# Patient Record
Sex: Male | Born: 1940
Health system: Southern US, Community
[De-identification: ages and names within clinical notes are randomized; demographics above are authoritative.]

## PROBLEM LIST (undated history)

## (undated) DIAGNOSIS — Z8659 Personal history of other mental and behavioral disorders: Secondary | ICD-10-CM

## (undated) DIAGNOSIS — E785 Hyperlipidemia, unspecified: Secondary | ICD-10-CM

## (undated) DIAGNOSIS — E039 Hypothyroidism, unspecified: Secondary | ICD-10-CM

## (undated) DIAGNOSIS — Z77098 Contact with and (suspected) exposure to other hazardous, chiefly nonmedicinal, chemicals: Secondary | ICD-10-CM

## (undated) DIAGNOSIS — I2510 Atherosclerotic heart disease of native coronary artery without angina pectoris: Secondary | ICD-10-CM

## (undated) DIAGNOSIS — M1990 Unspecified osteoarthritis, unspecified site: Secondary | ICD-10-CM

## (undated) DIAGNOSIS — R51 Headache: Secondary | ICD-10-CM

## (undated) DIAGNOSIS — C61 Malignant neoplasm of prostate: Secondary | ICD-10-CM

## (undated) DIAGNOSIS — Q249 Congenital malformation of heart, unspecified: Secondary | ICD-10-CM

## (undated) DIAGNOSIS — R519 Headache, unspecified: Secondary | ICD-10-CM

## (undated) DIAGNOSIS — F4310 Post-traumatic stress disorder, unspecified: Secondary | ICD-10-CM

## (undated) DIAGNOSIS — N400 Enlarged prostate without lower urinary tract symptoms: Secondary | ICD-10-CM

## (undated) HISTORY — DX: Benign prostatic hyperplasia without lower urinary tract symptoms: N40.0

## (undated) HISTORY — DX: Personal history of other mental and behavioral disorders: Z86.59

## (undated) HISTORY — DX: Malignant neoplasm of prostate: C61

## (undated) HISTORY — DX: Hyperlipidemia, unspecified: E78.5

## (undated) HISTORY — DX: Unspecified osteoarthritis, unspecified site: M19.90

## (undated) HISTORY — PX: HERNIA REPAIR: SHX51

## (undated) HISTORY — DX: Congenital malformation of heart, unspecified: Q24.9

---

## 2000-06-21 ENCOUNTER — Inpatient Hospital Stay (HOSPITAL_COMMUNITY): Admission: EM | Admit: 2000-06-21 | Discharge: 2000-06-23 | Payer: Self-pay | Admitting: Emergency Medicine

## 2000-06-22 ENCOUNTER — Encounter: Payer: Self-pay | Admitting: *Deleted

## 2011-08-17 ENCOUNTER — Encounter: Payer: Self-pay | Admitting: Cardiology

## 2012-02-24 DIAGNOSIS — R079 Chest pain, unspecified: Secondary | ICD-10-CM | POA: Diagnosis present

## 2012-02-24 DIAGNOSIS — I2510 Atherosclerotic heart disease of native coronary artery without angina pectoris: Secondary | ICD-10-CM | POA: Diagnosis present

## 2012-04-20 ENCOUNTER — Other Ambulatory Visit (HOSPITAL_COMMUNITY): Payer: Self-pay | Admitting: Family Medicine

## 2012-04-20 ENCOUNTER — Ambulatory Visit (HOSPITAL_COMMUNITY)
Admission: RE | Admit: 2012-04-20 | Discharge: 2012-04-20 | Disposition: A | Discharge status: Home / Self Care | Payer: No Typology Code available for payment source | Source: Ambulatory Visit | Attending: Family Medicine | Admitting: Family Medicine

## 2012-04-20 DIAGNOSIS — R109 Unspecified abdominal pain: Secondary | ICD-10-CM | POA: Insufficient documentation

## 2012-04-20 DIAGNOSIS — R102 Pelvic and perineal pain: Secondary | ICD-10-CM

## 2012-04-20 DIAGNOSIS — N419 Inflammatory disease of prostate, unspecified: Secondary | ICD-10-CM | POA: Insufficient documentation

## 2012-04-20 IMAGING — CT CT ABD-PELV W/O CM
2 of 4 series · 16 of 46 positions shown, 18 images · non-contrast
Comparison: None.

CLINICAL DATA: Pelvic pain with prostatitis for 1 month.  Question
urinary tract calculus.

CT ABDOMEN AND PELVIS WITHOUT CONTRAST
TECHNIQUE: Multidetector CT imaging of the abdomen and pelvis was
performed following the standard protocol without intravenous
contrast.

[Series 3: standard/full over (age)lbs 5.0 · axial · 0.74mm/px · z∈[-490,-90]mm · 13 of 88 slices shown, 15 images]
[im 4/88  soft-tissue]
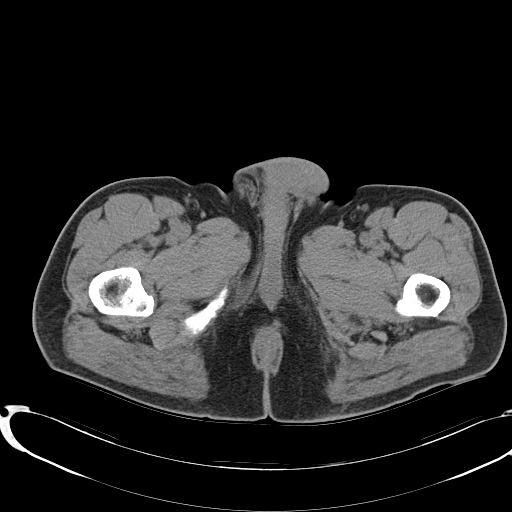
[im 4/88  bone]
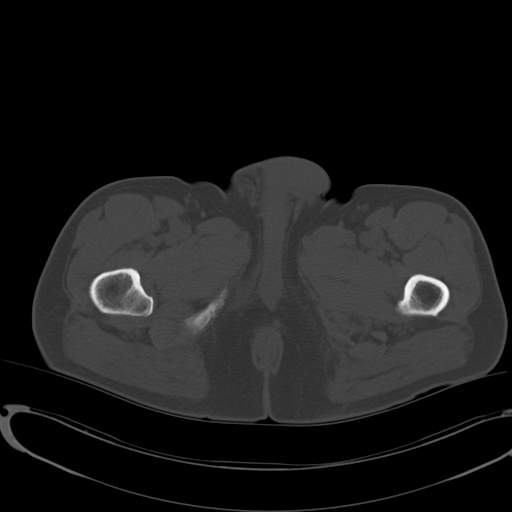
[im 11/88  soft-tissue]
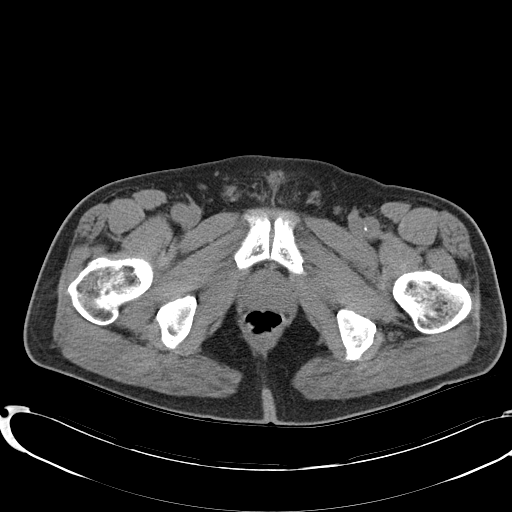
[im 19/88  soft-tissue]
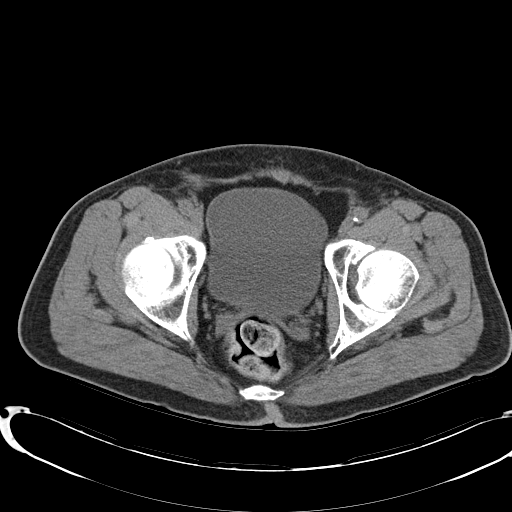
[im 26/88  soft-tissue]
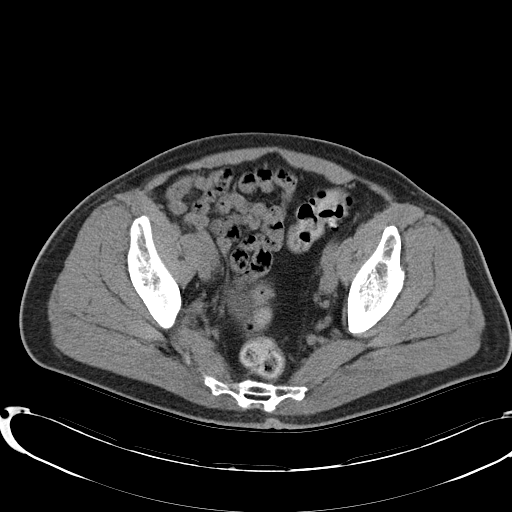
[im 30/88  soft-tissue]
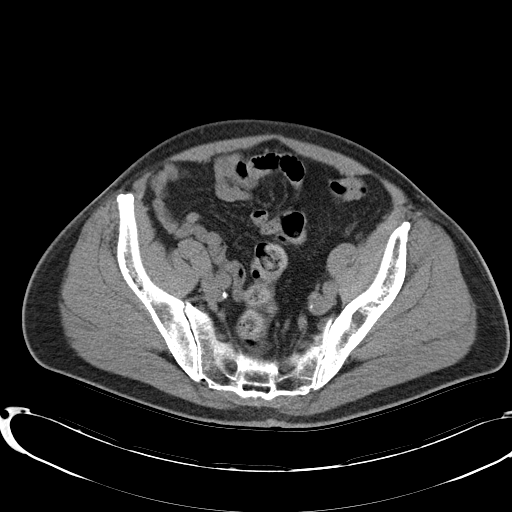
[im 37/88  soft-tissue]
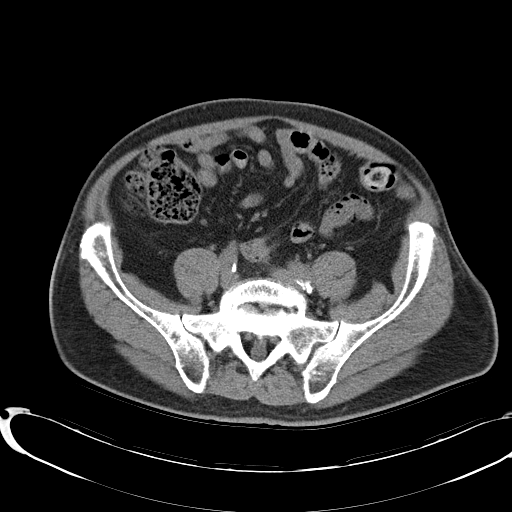
[im 44/88  soft-tissue]
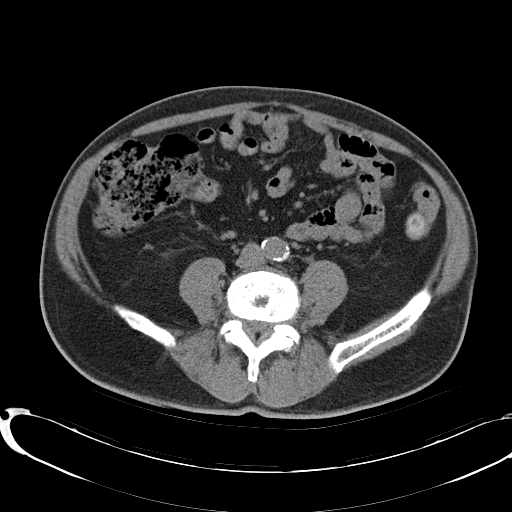
[im 51/88  soft-tissue]
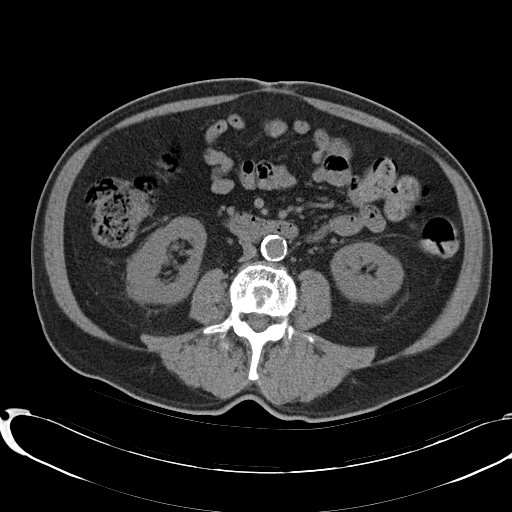
[im 59/88  soft-tissue]
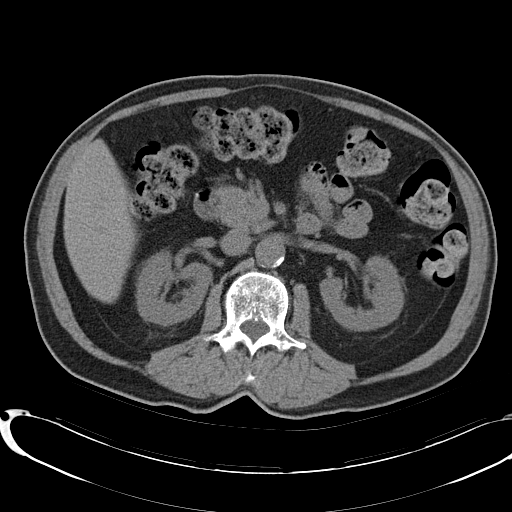
[im 59/88  bone]
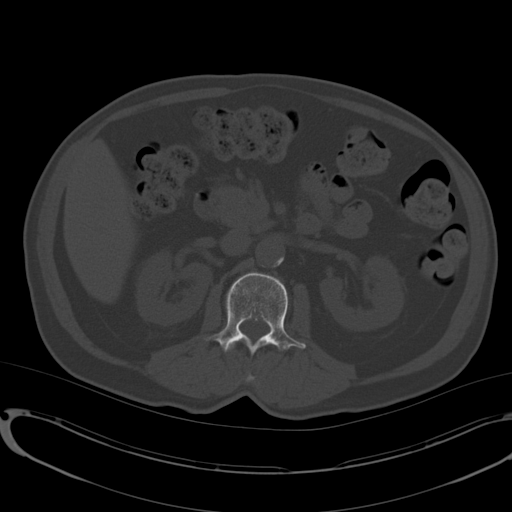
[im 62/88  soft-tissue]
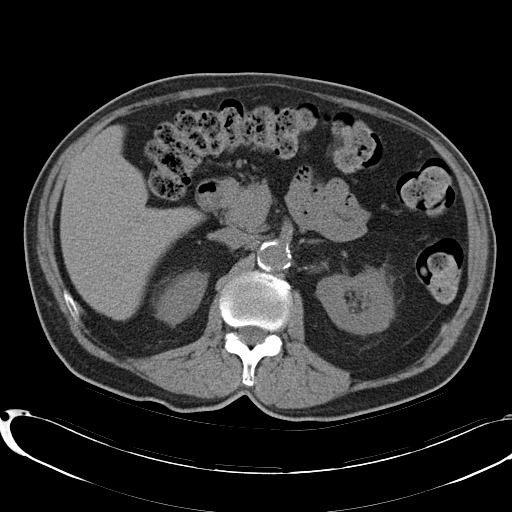
[im 69/88  soft-tissue]
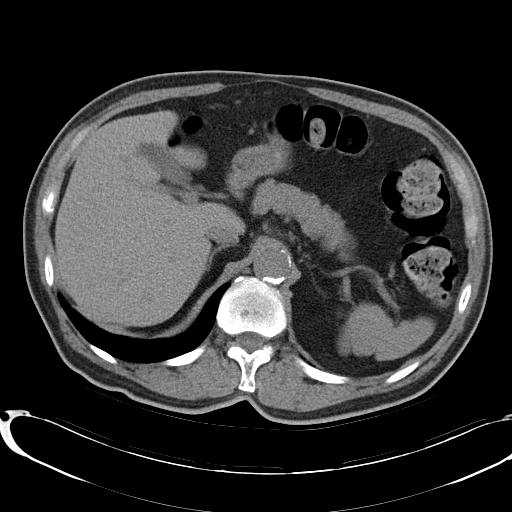
[im 77/88  soft-tissue]
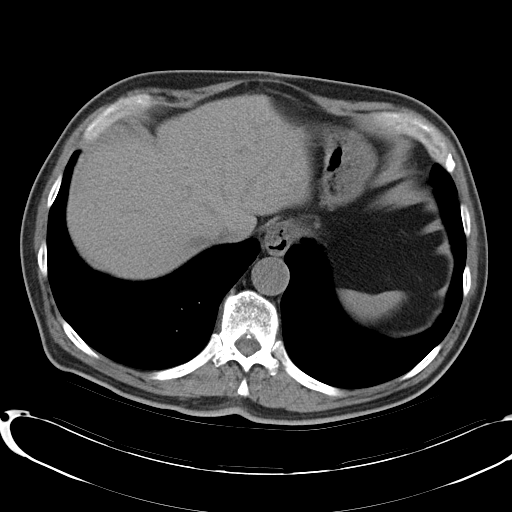
[im 84/88  soft-tissue]
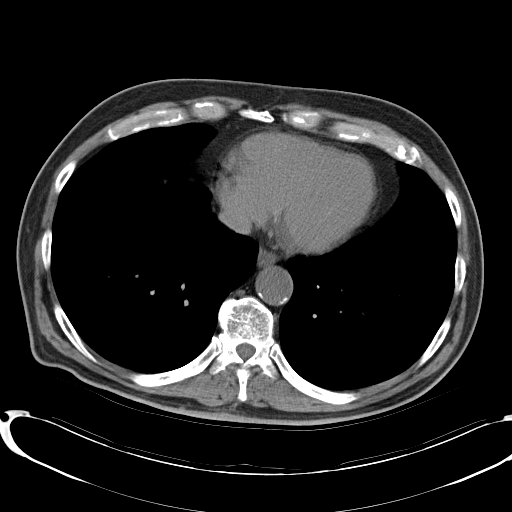

[Series 4: mpr coronal · coronal · 0.69mm/px · 3 of 84 slices shown]
[im 28/84  soft-tissue]
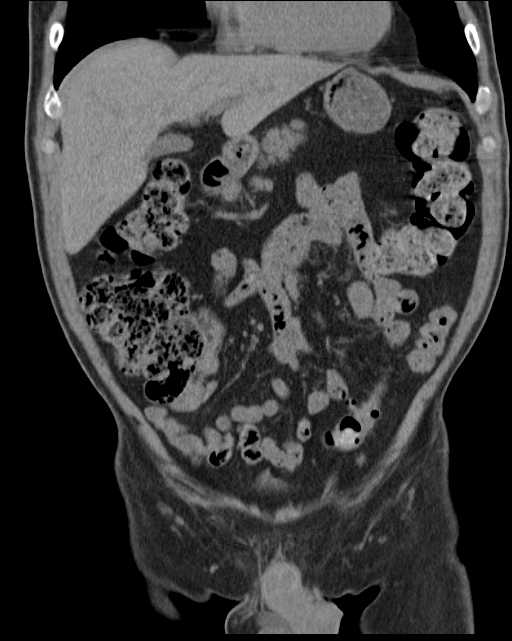
[im 37/84  soft-tissue]
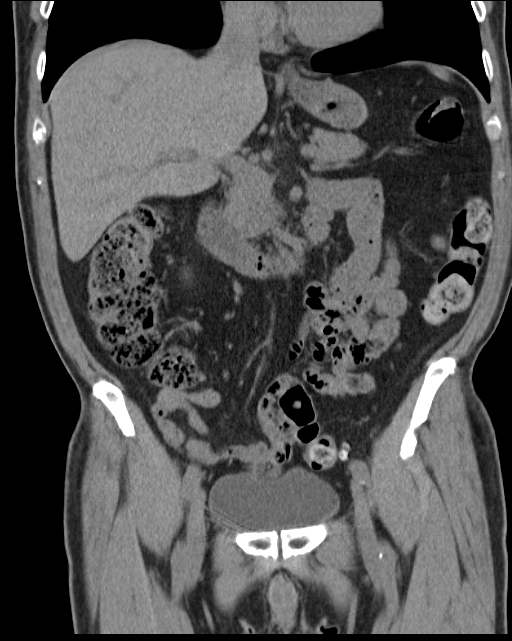
[im 47/84  soft-tissue]
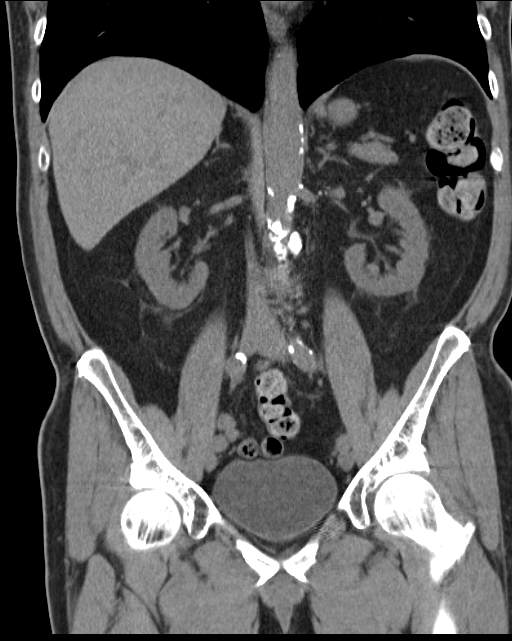

[16 of 46 positions shown; findings below may reference images not displayed]

FINDINGS: There is mild nonspecific symmetric perinephric soft
tissue stranding bilaterally.  There is no evidence of
hydronephrosis.  There is no evidence of renal, ureteral or bladder
calculus.  The kidneys and bladder demonstrate no abnormalities as
imaged in the noncontrast state. The prostate gland, seminal
vesicles and perirectal soft tissues appear normal.

The lung bases are clear.  There is no pleural effusion.  There is
a probable 8 mm cyst in the dome of the left hepatic lobe on image
number 10.  The liver, gallbladder, pancreas, spleen and adrenal
glands otherwise appear unremarkable.

There is mild diffuse aorto iliac atherosclerosis.  No enlarged
abdominal pelvic lymph nodes are identified.  There is stool
throughout the colon without surrounding inflammation.  The small
bowel and appendix appear normal.

Schmorl's nodes and facet degenerative changes are noted throughout
the lower lumbar spine. There is likely a degree of central and
foraminal stenosis at the lower three levels.  No worrisome osseous
findings are identified.
IMPRESSION: 1.  No evidence of urinary tract calculus or hydronephrosis.
2.  No acute abdominal pelvic findings identified.
3.  Lower lumbar spondylosis with probable resulting central and
foraminal stenosis from L3-L4 through L5-S1.

## 2012-05-22 ENCOUNTER — Telehealth: Payer: Self-pay | Admitting: Physician Assistant

## 2012-05-22 ENCOUNTER — Ambulatory Visit (INDEPENDENT_AMBULATORY_CARE_PROVIDER_SITE_OTHER): Payer: Medicare Other | Admitting: Family Medicine

## 2012-05-22 ENCOUNTER — Encounter: Payer: Self-pay | Admitting: Family Medicine

## 2012-05-22 VITALS — BP 142/79 | HR 54 | Temp 97.1°F | Ht 67.0 in | Wt 158.5 lb

## 2012-05-22 DIAGNOSIS — R51 Headache: Secondary | ICD-10-CM

## 2012-05-22 DIAGNOSIS — IMO0001 Reserved for inherently not codable concepts without codable children: Secondary | ICD-10-CM

## 2012-05-22 DIAGNOSIS — N400 Enlarged prostate without lower urinary tract symptoms: Secondary | ICD-10-CM

## 2012-05-22 DIAGNOSIS — J019 Acute sinusitis, unspecified: Secondary | ICD-10-CM

## 2012-05-22 DIAGNOSIS — J310 Chronic rhinitis: Secondary | ICD-10-CM

## 2012-05-22 DIAGNOSIS — E785 Hyperlipidemia, unspecified: Secondary | ICD-10-CM | POA: Insufficient documentation

## 2012-05-22 DIAGNOSIS — R030 Elevated blood-pressure reading, without diagnosis of hypertension: Secondary | ICD-10-CM

## 2012-05-22 MED ORDER — FLUTICASONE PROPIONATE 50 MCG/ACT NA SUSP: 2.0000 | Freq: Every day | NASAL | Status: DC

## 2012-05-22 MED ORDER — AMOXICILLIN-POT CLAVULANATE 875-125 MG PO TABS: 1.0000 | ORAL_TABLET | Freq: Two times a day (BID) | ORAL | Status: DC

## 2012-05-22 MED ORDER — TRAMADOL HCL 50 MG PO TABS: 100.0000 mg | ORAL_TABLET | Freq: Three times a day (TID) | ORAL | Status: DC | PRN

## 2012-05-22 NOTE — Telephone Encounter (Signed)
SINUS PROBLEM- X 3 WKS

## 2012-05-22 NOTE — Telephone Encounter (Signed)
Patient would like an appt today for sinus pain and bad headaches that he has been having for about 3 weeks now.

## 2012-05-22 NOTE — Progress Notes (Signed)
Subjective:     Patient ID: Keith Olson., male   DOB: 1940-04-13, 72 y.o.   MRN: [id number]  HPI  Mr. Vue comes in with a complaint of 3 weeks of having nasal congestion with foul smelling yellow phlegm or mucus. A niece having pain in his cheeks and his forehead. Has had headaches on and off in the past but not this bad before, he is taking Goody powders everyday and it may be making his headaches worse. He is also using Afrin for 3 days and his nose seems to be getting more stuffed up. No neurologic deficits. No visual deficits. No facial swelling. No fever. No blurred vision. Nothing is relieving the symptoms. He thought it was time to get a strong antibiotic.  Past Medical History  Diagnosis Date  . Hyperlipidemia   . Enlarged prostate    History reviewed. No pertinent past surgical history. History   Social History  . Marital Status: Married    Spouse Name: N/A    Number of Children: N/A  . Years of Education: N/A   Occupational History  . retired    Social History Main Topics  . Smoking status: Never Smoker   . Smokeless tobacco: Not on file  . Alcohol Use: No  . Drug Use: No  . Sexually Active: Not on file   Other Topics Concern  . Not on file   Social History Narrative   Married   5 children   History reviewed. No pertinent family history. No current outpatient prescriptions on file prior to visit.   No current facility-administered medications on file prior to visit.   Allergies  Allergen Reactions  . Lipitor (Atorvastatin)     Muscles aches    There is no immunization history on file for this patient. Prior to Admission medications   Medication Sig Start Date End Date Taking? Authorizing Provider  oxymetazoline (AFRIN) 0.05 % nasal spray Place 2 sprays into the nose 2 (two) times daily.   Yes Historical Provider, MD  rosuvastatin (CRESTOR) 10 MG tablet Take 10 mg by mouth daily.   Yes Historical Provider, MD  tamsulosin (FLOMAX) 0.4 MG CAPS Take  0.4 mg by mouth.   Yes Historical Provider, MD  amoxicillin-clavulanate (AUGMENTIN) 875-125 MG per tablet Take 1 tablet by mouth 2 (two) times daily. 05/22/12   Ileana Ladd, MD  fluticasone (FLONASE) 50 MCG/ACT nasal spray Place 2 sprays into the nose daily. 05/22/12   Ileana Ladd, MD  traMADol (ULTRAM) 50 MG tablet Take 2 tablets (100 mg total) by mouth every 8 (eight) hours as needed for pain. 05/22/12   Ileana Ladd, MD    Review of Systems All other symptoms were negative. The medication he has used so far has not affected his BPH. He has never had a history of hypertension.     Objective:   Physical Exam On examination he appeared in no acute distress. Vital signs as documented. BP 142/79  Pulse 54  Temp(Src) 97.1 F (36.2 C) (Oral)  Ht 5\' 7"  (1.702 m)  Wt 158 lb 8 oz (71.895 kg)  BMI 24.82 kg/m2  Skin warm and dry and without overt rashes.  Head &Neck without JVD. Pearly nasal discharge. Maxillary sinus tender frontal sinuses mildly tender. Throat was clear except for postnasal drip. Eyes are normal ears normal neck was supple. Lungs clear.  Heart exam notable for regular rhythm, normal sounds and absence of murmurs, rubs or gallops.  Abdomen unremarkable and without evidence  of organomegaly, masses, or abdominal aortic enlargement.  Extremities nonedematous. Neurologically, the patient was awake, alert, and oriented to person, place and time. There were no obvious focal neurologic abnormalities.    Assessment:     Other and unspecified hyperlipidemia  BPH (benign prostatic hyperplasia)  Purulent rhinitis  Acute sinusitis - Plan: amoxicillin-clavulanate (AUGMENTIN) 875-125 MG per tablet, fluticasone (FLONASE) 50 MCG/ACT nasal spray  Headache - Plan: traMADol (ULTRAM) 50 MG tablet  Elevated blood pressure      Plan:      No results found for this or any previous visit.   Medications prescribed Augmentin 875 mg by mouth twice a day 10 days. Flonase nasal  spray was prescribed as per meds/orders. Tramadol 50 mg tablets to be taken to every 8 hours when necessary headache. If any neurologic deficit call 911 or TED stat. His blood pressure was elevated probably due to Afrin and the Goody powders. Patient immediately discontinue the Afrin and Goody powders. Plan for him to return to clinic in 4 weeks to recheck his blood pressure. Spent 25 minutes discussing the patient treatment for headaches. And in view of his acute medical problems risk of complications from sinusitis results into a neurologic infection discussed with patient. Sanna Porcaro P. [place] Charon, M.D.

## 2012-06-22 ENCOUNTER — Ambulatory Visit: Payer: Medicare Other | Admitting: Family Medicine

## 2012-08-17 ENCOUNTER — Ambulatory Visit (INDEPENDENT_AMBULATORY_CARE_PROVIDER_SITE_OTHER): Payer: Medicare Other

## 2012-08-17 ENCOUNTER — Ambulatory Visit (INDEPENDENT_AMBULATORY_CARE_PROVIDER_SITE_OTHER): Payer: Medicare Other | Admitting: Family Medicine

## 2012-08-17 ENCOUNTER — Encounter: Payer: Self-pay | Admitting: Family Medicine

## 2012-08-17 VITALS — BP 122/72 | HR 56 | Temp 96.8°F | Ht 66.5 in | Wt 158.0 lb

## 2012-08-17 DIAGNOSIS — M25569 Pain in unspecified knee: Secondary | ICD-10-CM

## 2012-08-17 DIAGNOSIS — M79609 Pain in unspecified limb: Secondary | ICD-10-CM

## 2012-08-17 DIAGNOSIS — M549 Dorsalgia, unspecified: Secondary | ICD-10-CM

## 2012-08-17 DIAGNOSIS — M25519 Pain in unspecified shoulder: Secondary | ICD-10-CM

## 2012-08-17 DIAGNOSIS — M2550 Pain in unspecified joint: Secondary | ICD-10-CM

## 2012-08-17 LAB — COMPREHENSIVE METABOLIC PANEL
ALT: 20 U/L (ref 0–53)
AST: 21 U/L (ref 0–37)
Albumin: 4.1 g/dL (ref 3.5–5.2)
Alkaline Phosphatase: 53 U/L (ref 39–117)
BUN: 8 mg/dL (ref 6–23)
CO2: 23 mEq/L (ref 19–32)
Calcium: 9.4 mg/dL (ref 8.4–10.5)
Chloride: 106 mEq/L (ref 96–112)
Creat: 0.79 mg/dL (ref 0.50–1.35)
Glucose, Bld: 77 mg/dL (ref 70–99)
Potassium: 4.5 mEq/L (ref 3.5–5.3)
Sodium: 140 mEq/L (ref 135–145)
Total Bilirubin: 0.4 mg/dL (ref 0.3–1.2)
Total Protein: 7.1 g/dL (ref 6.0–8.3)

## 2012-08-17 LAB — POCT URINALYSIS DIPSTICK
Bilirubin, UA: NEGATIVE
Blood, UA: NEGATIVE
Glucose, UA: NEGATIVE
Ketones, UA: NEGATIVE
Leukocytes, UA: NEGATIVE
Nitrite, UA: NEGATIVE
Protein, UA: NEGATIVE
Spec Grav, UA: <=1.005
Urobilinogen, UA: NEGATIVE
pH, UA: 6.5

## 2012-08-17 LAB — TSH: TSH: 5.101 u[IU]/mL — ABNORMAL HIGH (ref 0.350–4.500)

## 2012-08-17 LAB — CBC WITH DIFFERENTIAL/PLATELET
Basophils Absolute: 0 10*3/uL (ref 0.0–0.1)
Basophils Relative: 1 % (ref 0–1)
Eosinophils Absolute: 0.2 10*3/uL (ref 0.0–0.7)
Eosinophils Relative: 3 % (ref 0–5)
HCT: 41.2 % (ref 39.0–52.0)
Hemoglobin: 13.9 g/dL (ref 13.0–17.0)
Lymphocytes Relative: 42 % (ref 12–46)
Lymphs Abs: 2.6 10*3/uL (ref 0.7–4.0)
MCH: 29.3 pg (ref 26.0–34.0)
MCHC: 33.7 g/dL (ref 30.0–36.0)
MCV: 86.9 fL (ref 78.0–100.0)
Monocytes Absolute: 0.5 10*3/uL (ref 0.1–1.0)
Monocytes Relative: 8 % (ref 3–12)
Neutro Abs: 2.8 10*3/uL (ref 1.7–7.7)
Neutrophils Relative %: 46 % (ref 43–77)
Platelets: 231 10*3/uL (ref 150–400)
RBC: 4.74 MIL/uL (ref 4.22–5.81)
RDW: 14.1 % (ref 11.5–15.5)
WBC: 6.1 10*3/uL (ref 4.0–10.5)

## 2012-08-17 LAB — SEDIMENTATION RATE: Sed Rate: 1 mm/hr (ref 0–16)

## 2012-08-17 LAB — URIC ACID: Uric Acid, Serum: 3.8 mg/dL — ABNORMAL LOW (ref 4.0–6.0)

## 2012-08-17 IMAGING — CR DG KNEE COMPLETE 4+V*L*
4 series · 4 of 4 positions shown · non-contrast
Comparison: None.

CLINICAL DATA: Knee pain, rule out arthritis.

LEFT KNEE - COMPLETE 4+ VIEW

[view not recorded (1 of 4)]
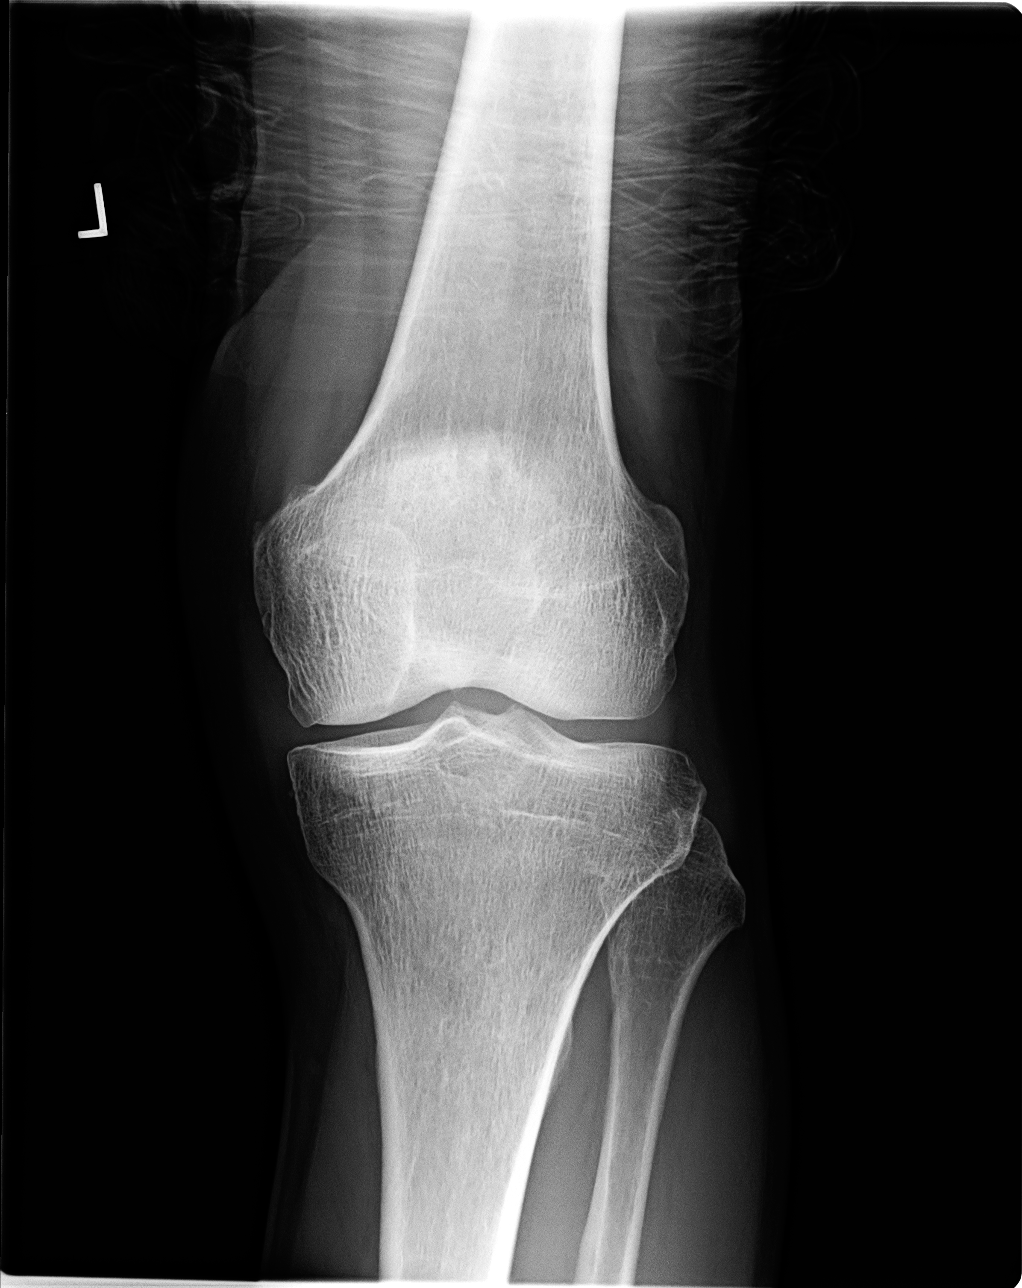

[view not recorded (2 of 4)]
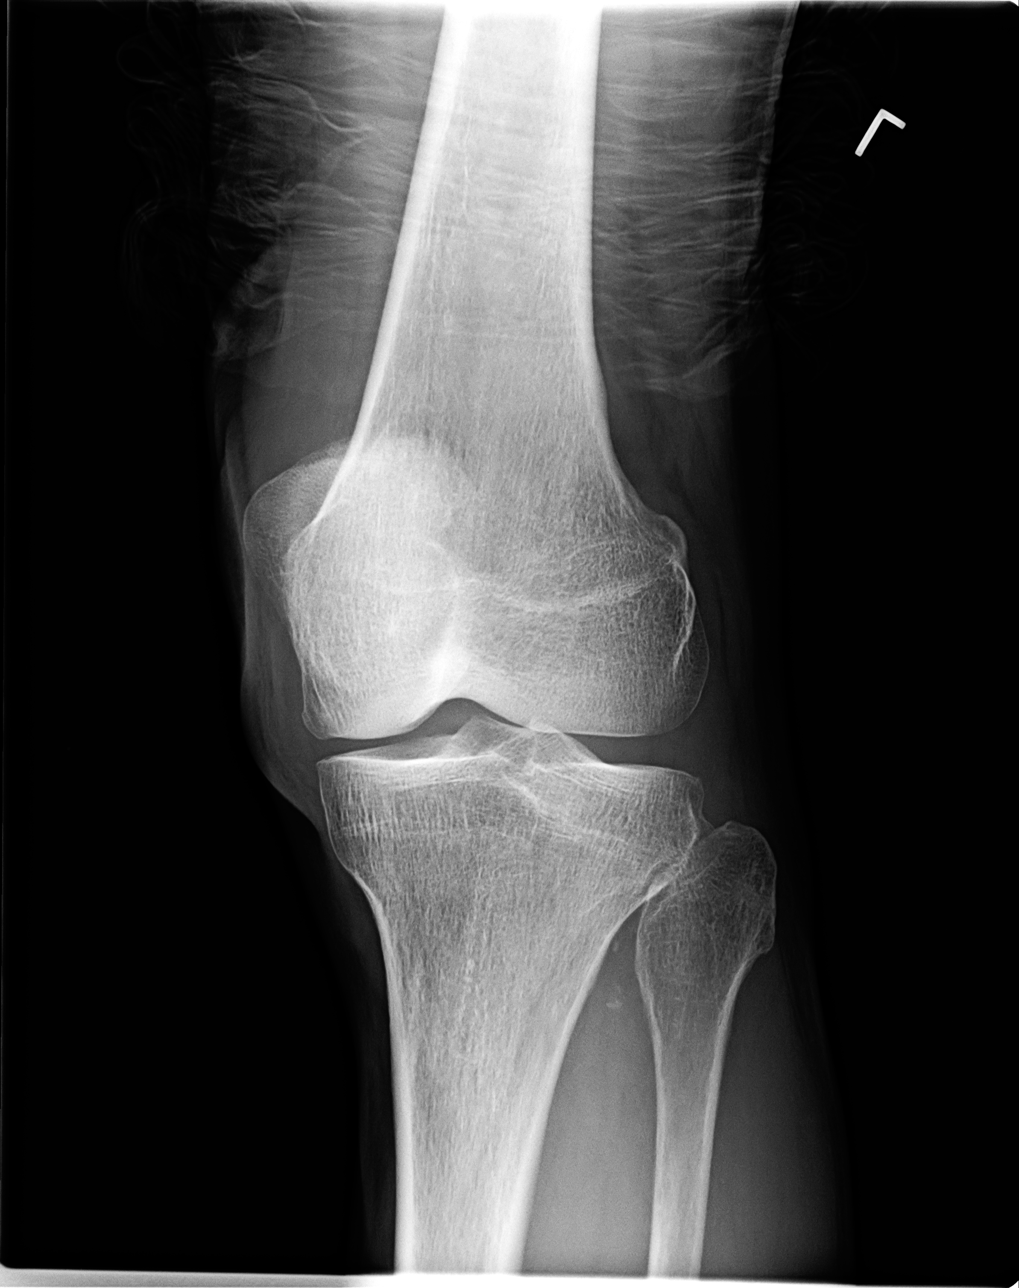

[view not recorded (3 of 4)]
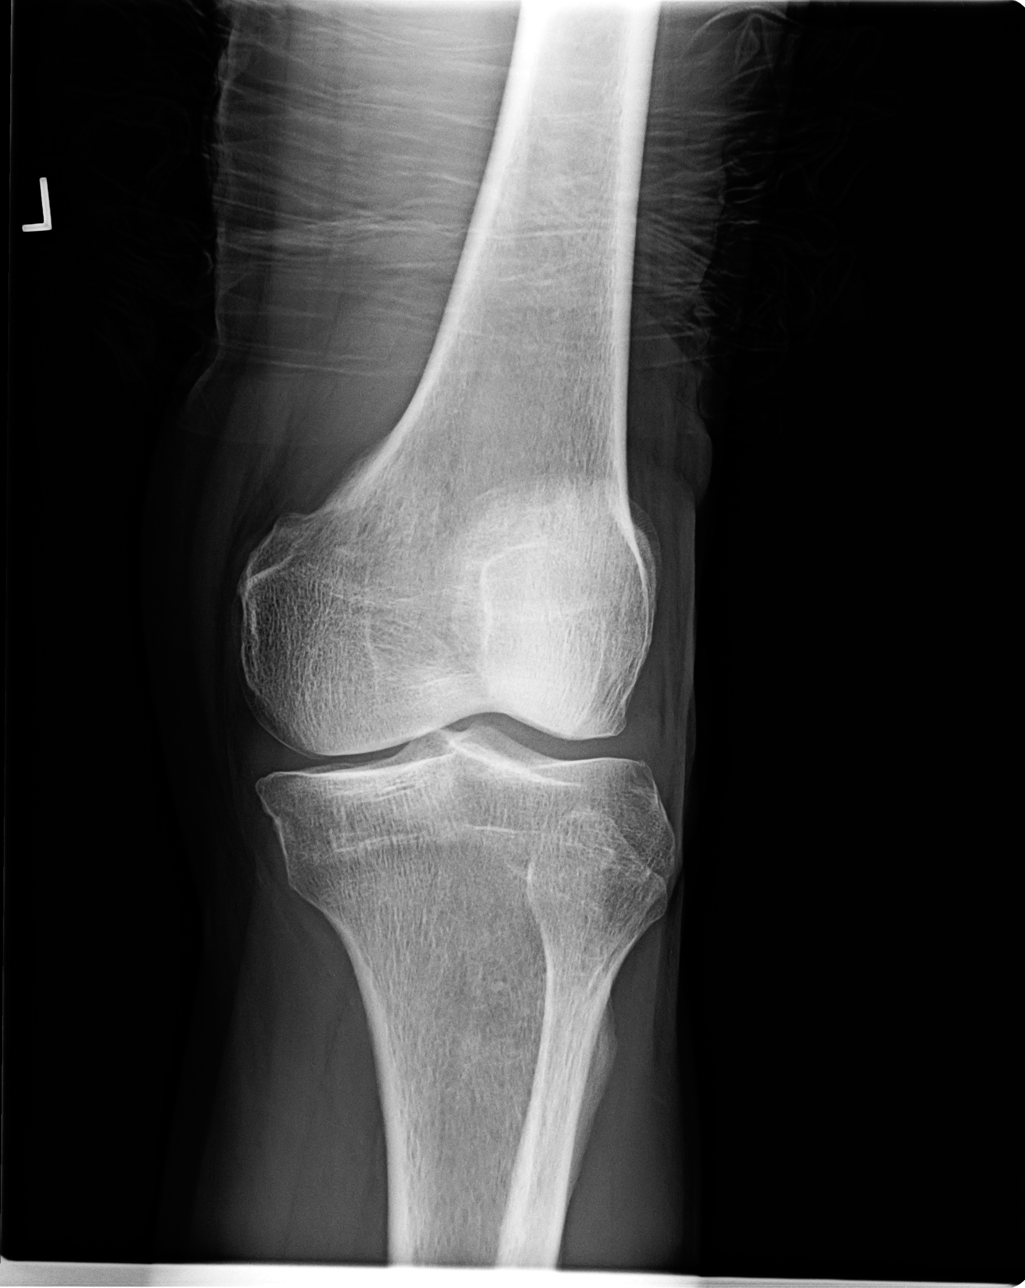

[view not recorded (4 of 4)]
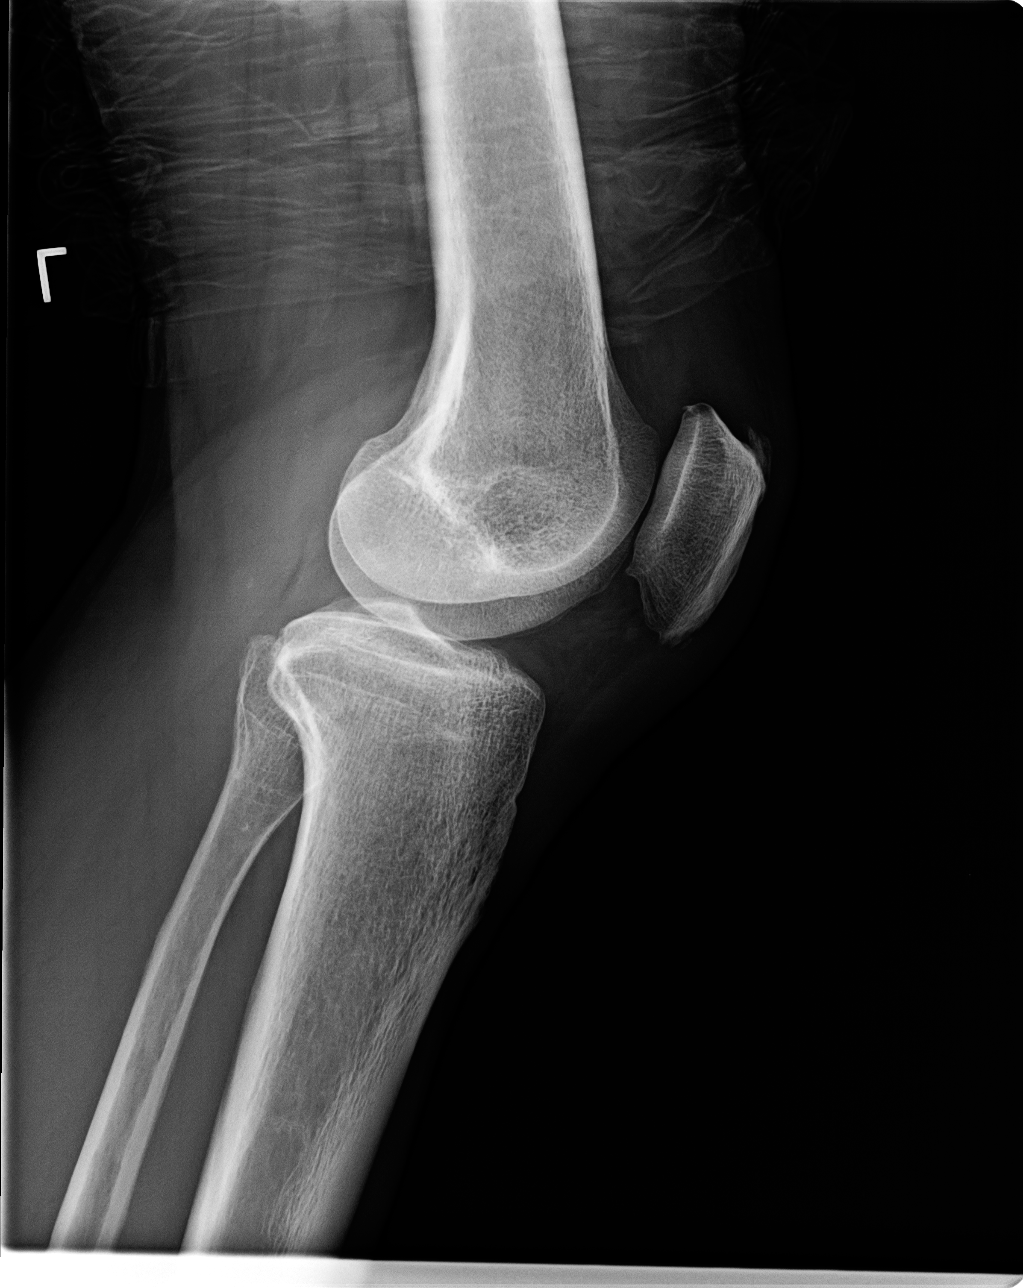

[4 of 4 positions shown; findings below may reference images not displayed]

FINDINGS: There is subtle narrowing of the medial compartment and
very minimal spurring of the superior and inferior pole of the
patella.  There is no fracture or joint effusion.
IMPRESSION: No acute findings.

Subtle degenerate changes.

Clinically significant discrepancy from primary report, if
provided: None

## 2012-08-17 IMAGING — CR DG SHOULDER 2+V*R*
4 series · 4 of 4 positions shown · non-contrast
Comparison: None.

CLINICAL DATA: Shoulder pain, possible arthritis

RIGHT SHOULDER - 2+ VIEW

[view not recorded (1 of 4)]
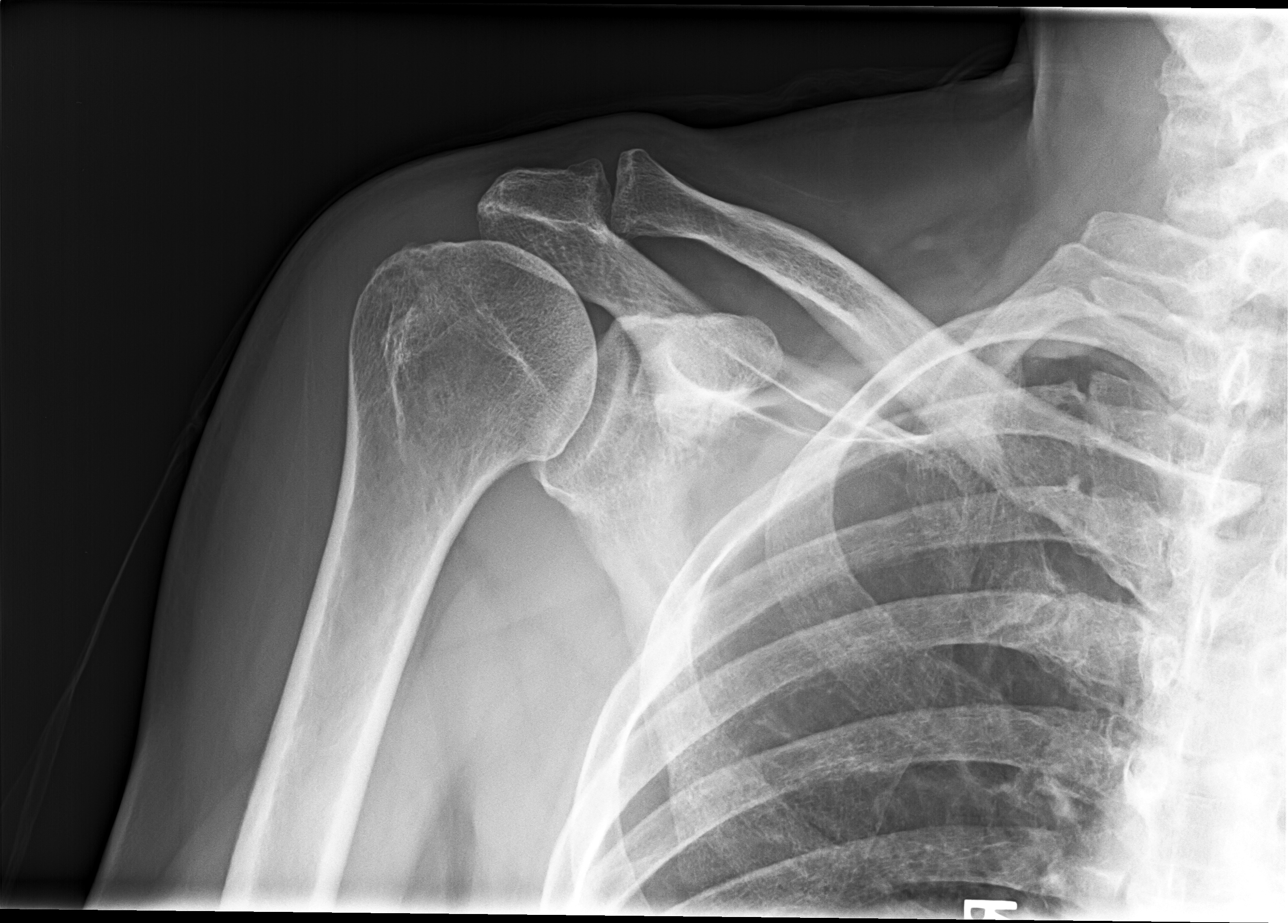

[view not recorded (2 of 4)]
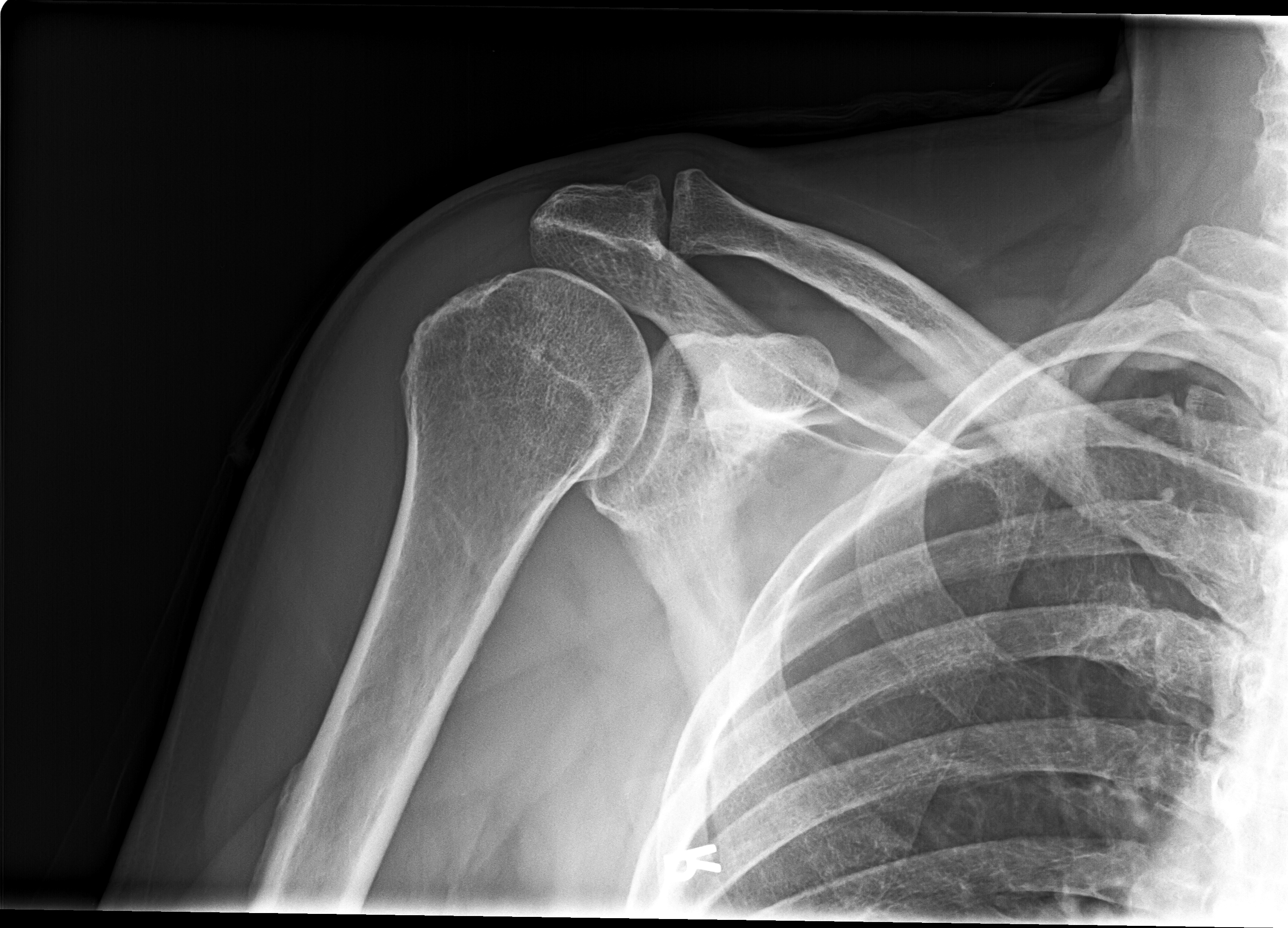

[view not recorded (3 of 4)]
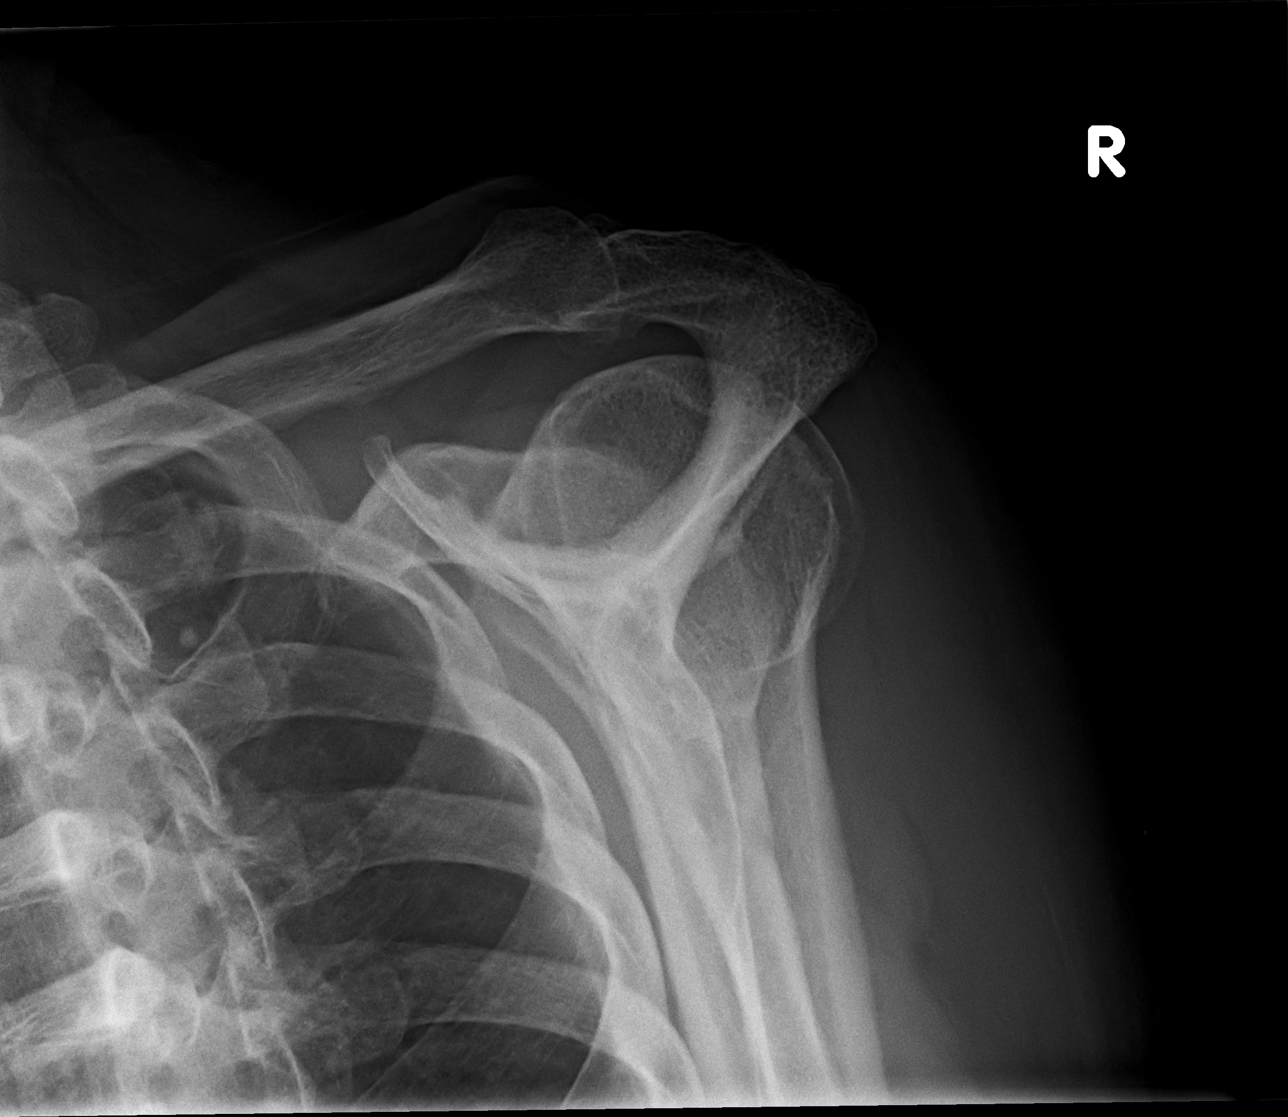

[view not recorded (4 of 4)]
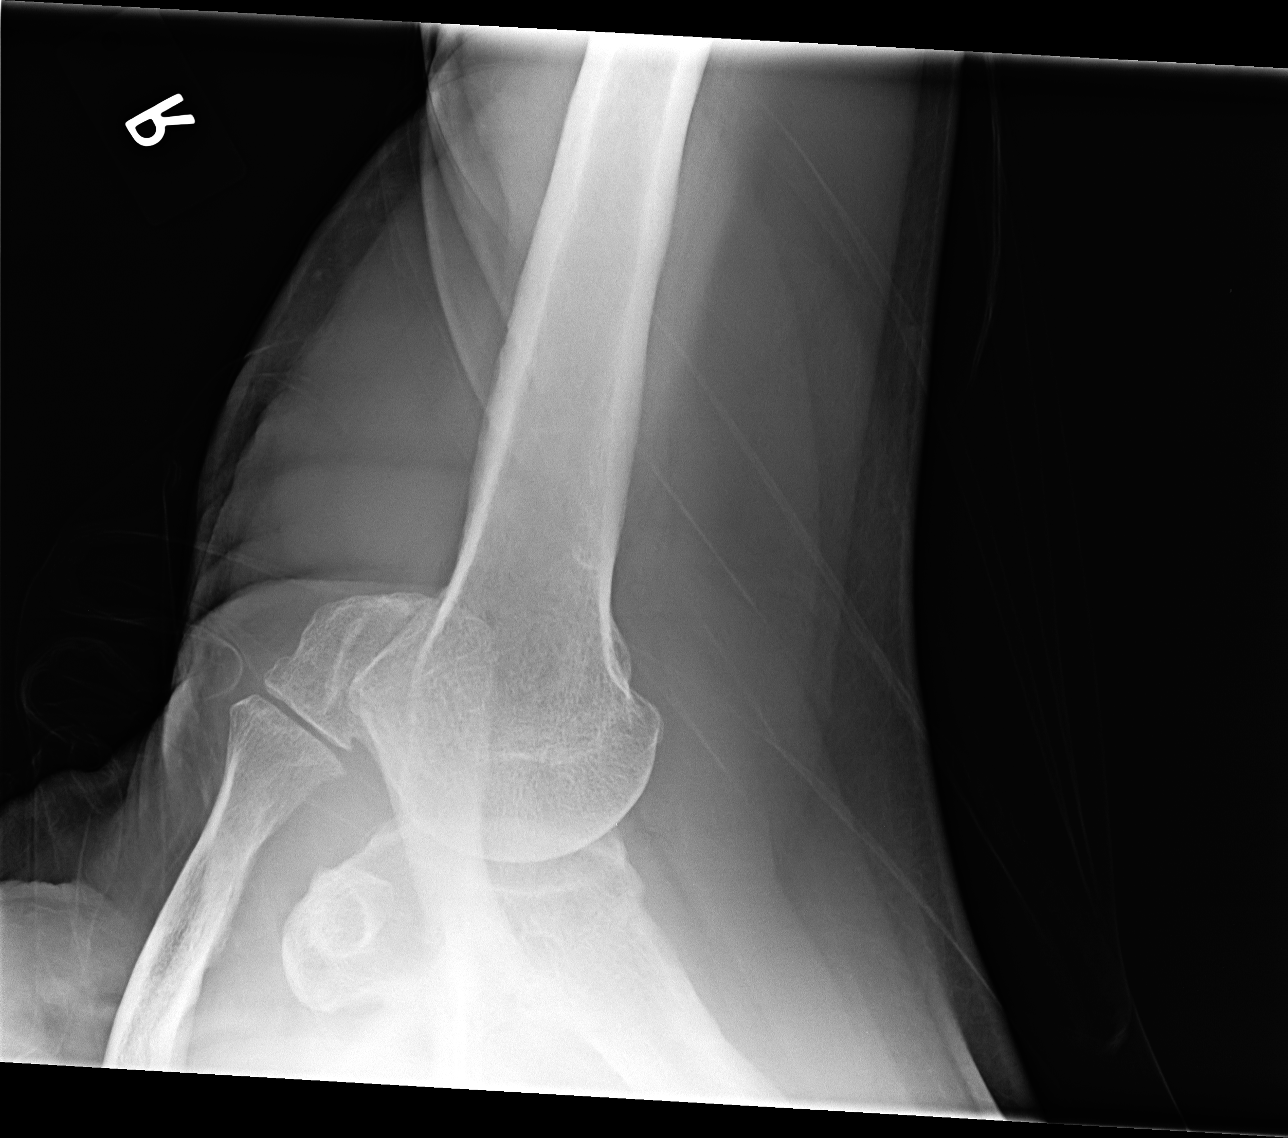

[4 of 4 positions shown; findings below may reference images not displayed]

FINDINGS: Four views of the right shoulder submitted.  No acute
fracture or subluxation.  Mild degenerative changes
acromioclavicular joint.
IMPRESSION: No acute fracture or subluxation.  Mild degenerative changes AC
joint.

Clinically significant discrepancy from primary report, if
provided: None

## 2012-08-17 IMAGING — CR DG LUMBAR SPINE COMPLETE 4+V
2 series · 2 of 2 positions shown · non-contrast
Comparison: None.

CLINICAL DATA: Low back pain

LUMBAR SPINE - COMPLETE 4+ VIEW

[view not recorded (1 of 2)]
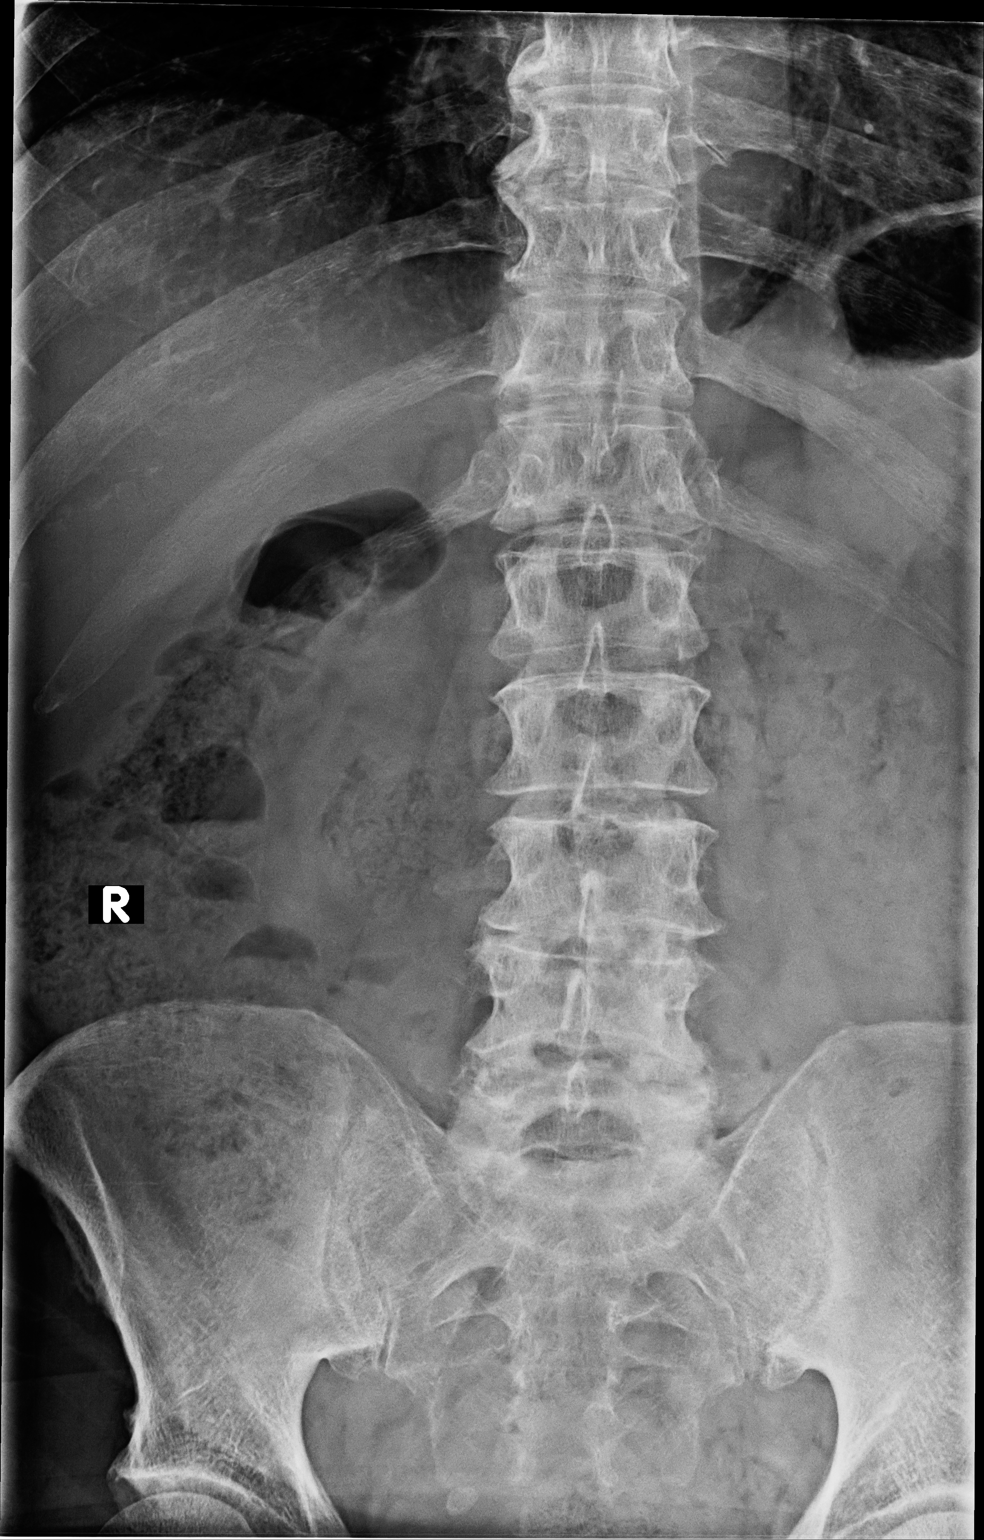

[view not recorded (2 of 2)]
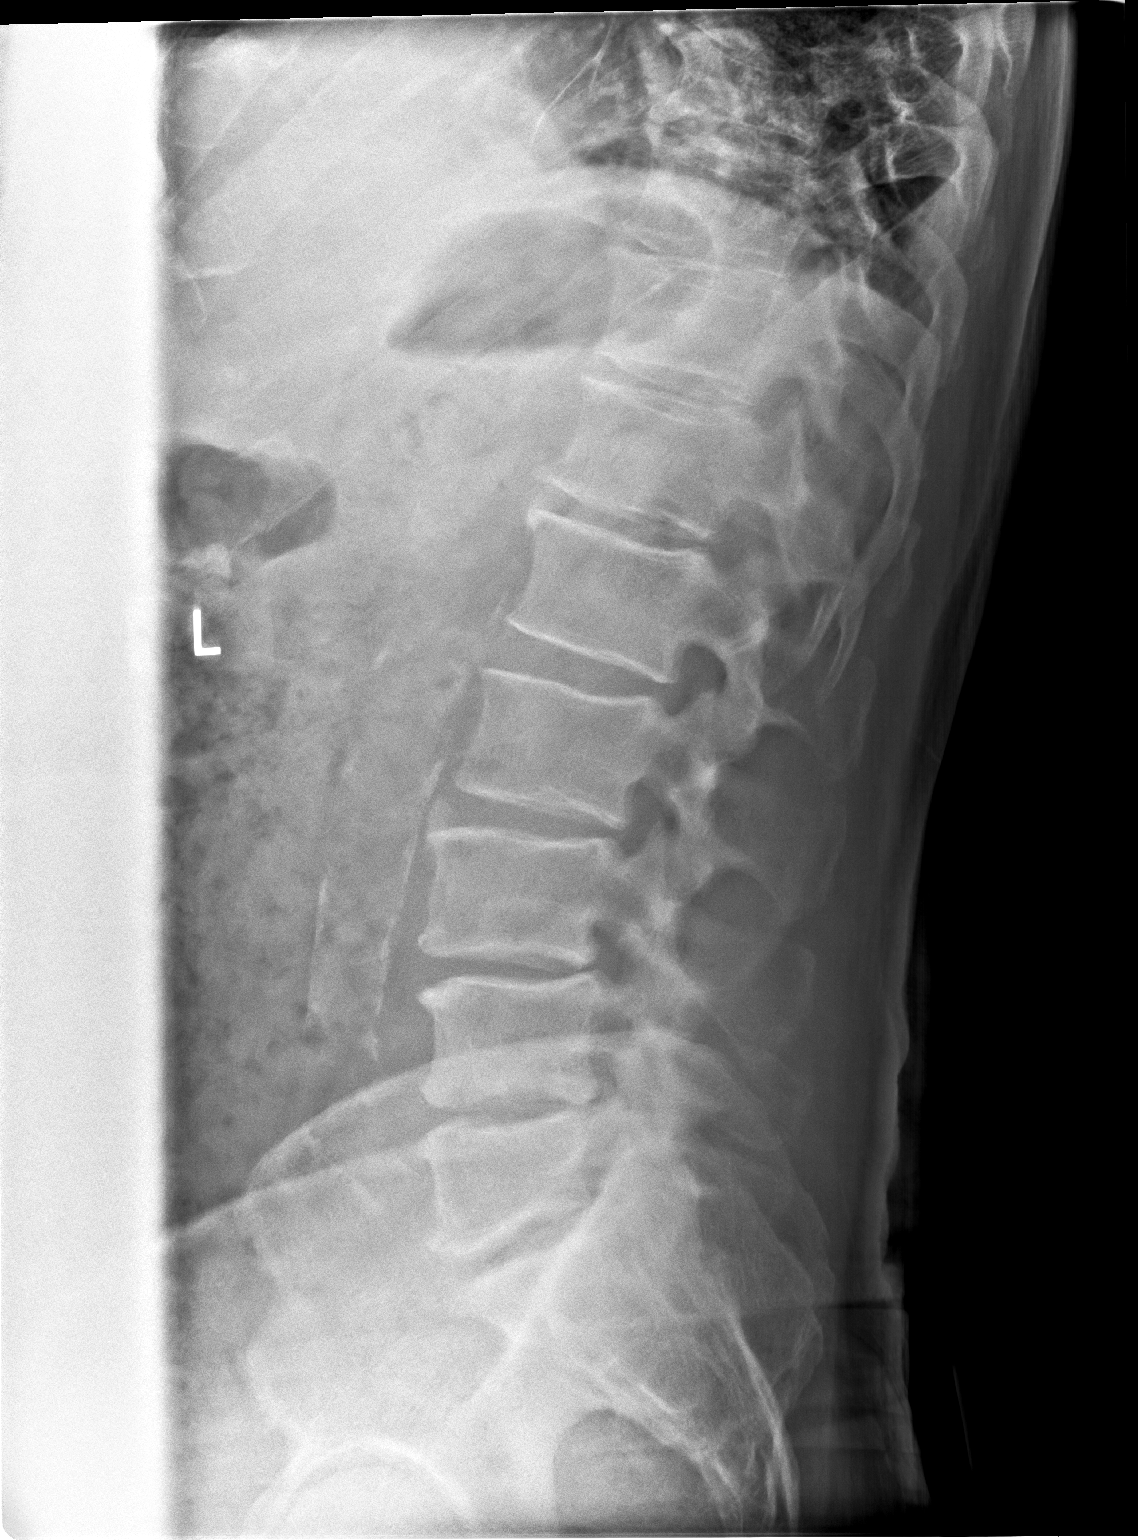

[2 of 2 positions shown; findings below may reference images not displayed]

FINDINGS: Two views of the lumbar spine submitted.  There is mild
disc space flattening with mild anterior spurring at L3-L4 L4-L5
and L5 S1 level.  Atherosclerotic calcifications of the abdominal
aorta and the iliac arteries.  Mild anterior spurring and minimal
disc space flattening at T12-L1 level. No acute fracture or
subluxation.
IMPRESSION: No acute fracture or subluxation.  Degenerative changes as
described above.

Clinically significant discrepancy from primary report, if
provided: None

## 2012-08-17 IMAGING — CR DG KNEE COMPLETE 4+V*R*
4 series · 4 of 4 positions shown · non-contrast
Comparison: None

CLINICAL DATA: Knee pain, possible arthritis

RIGHT KNEE - COMPLETE 4+ VIEW

[view not recorded (1 of 4)]
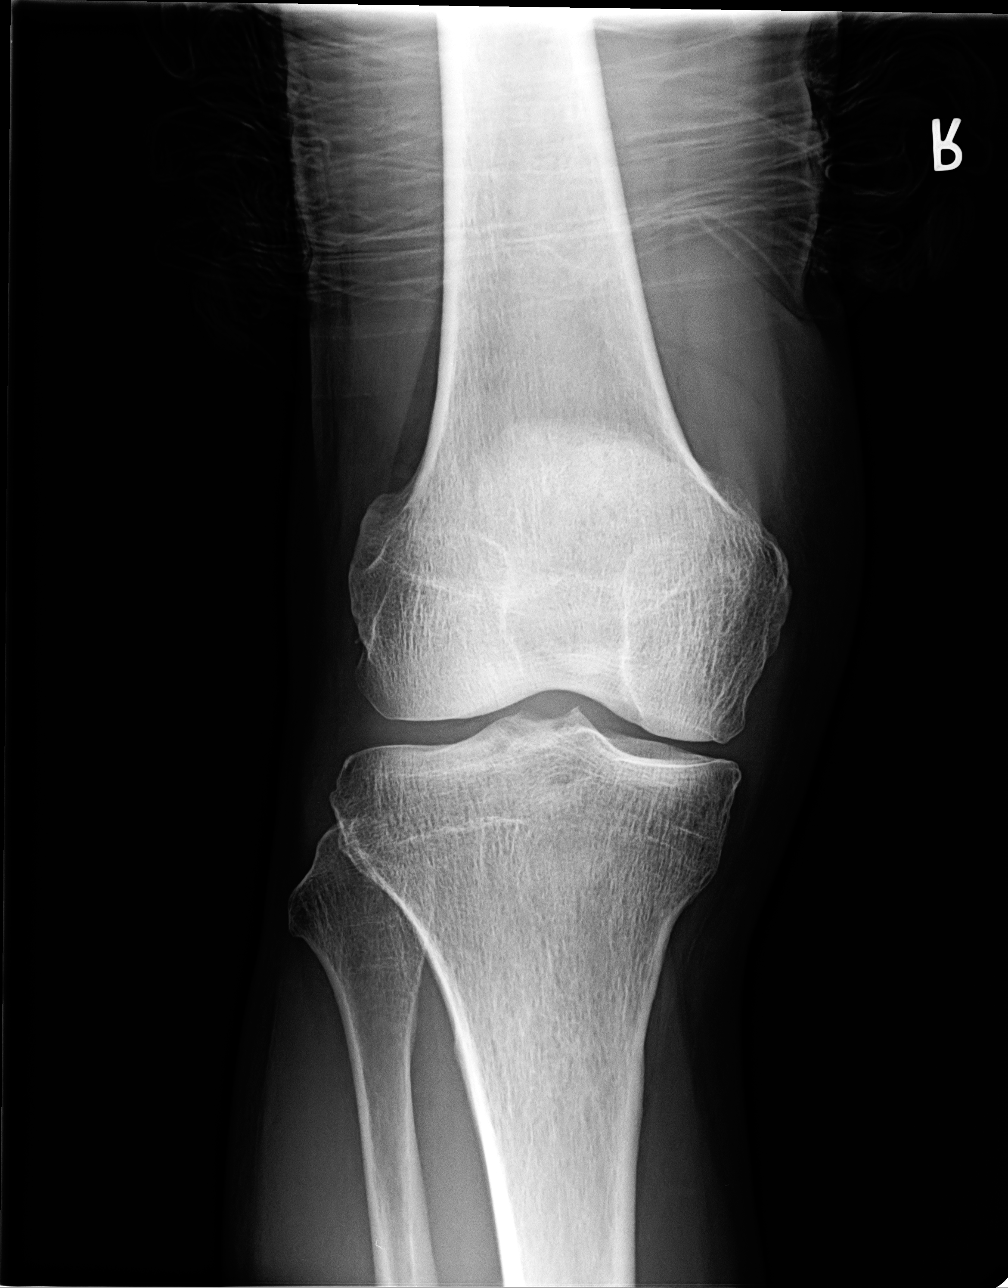

[view not recorded (2 of 4)]
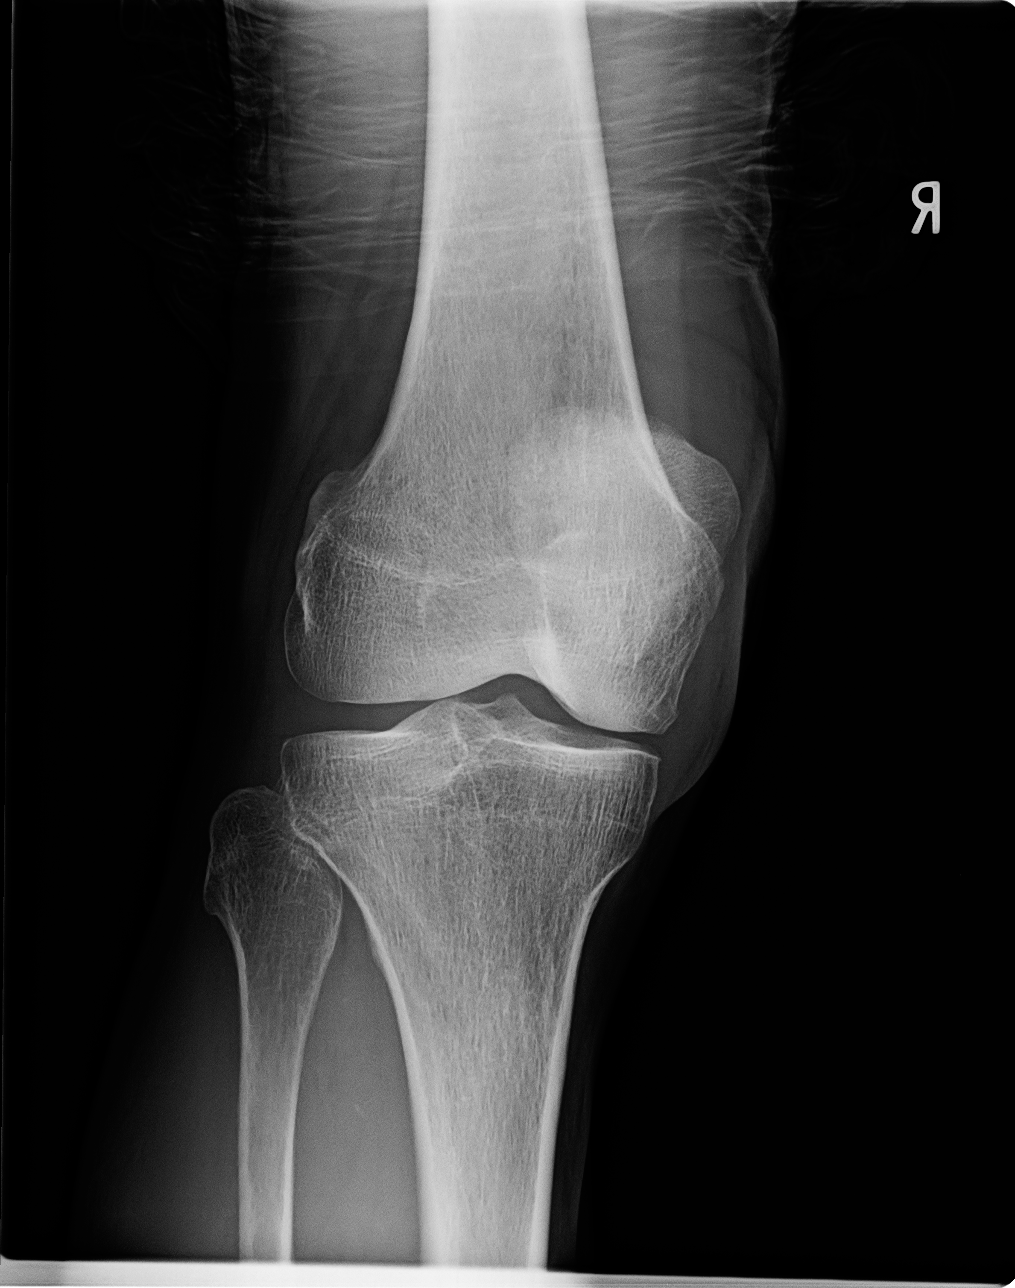

[view not recorded (3 of 4)]
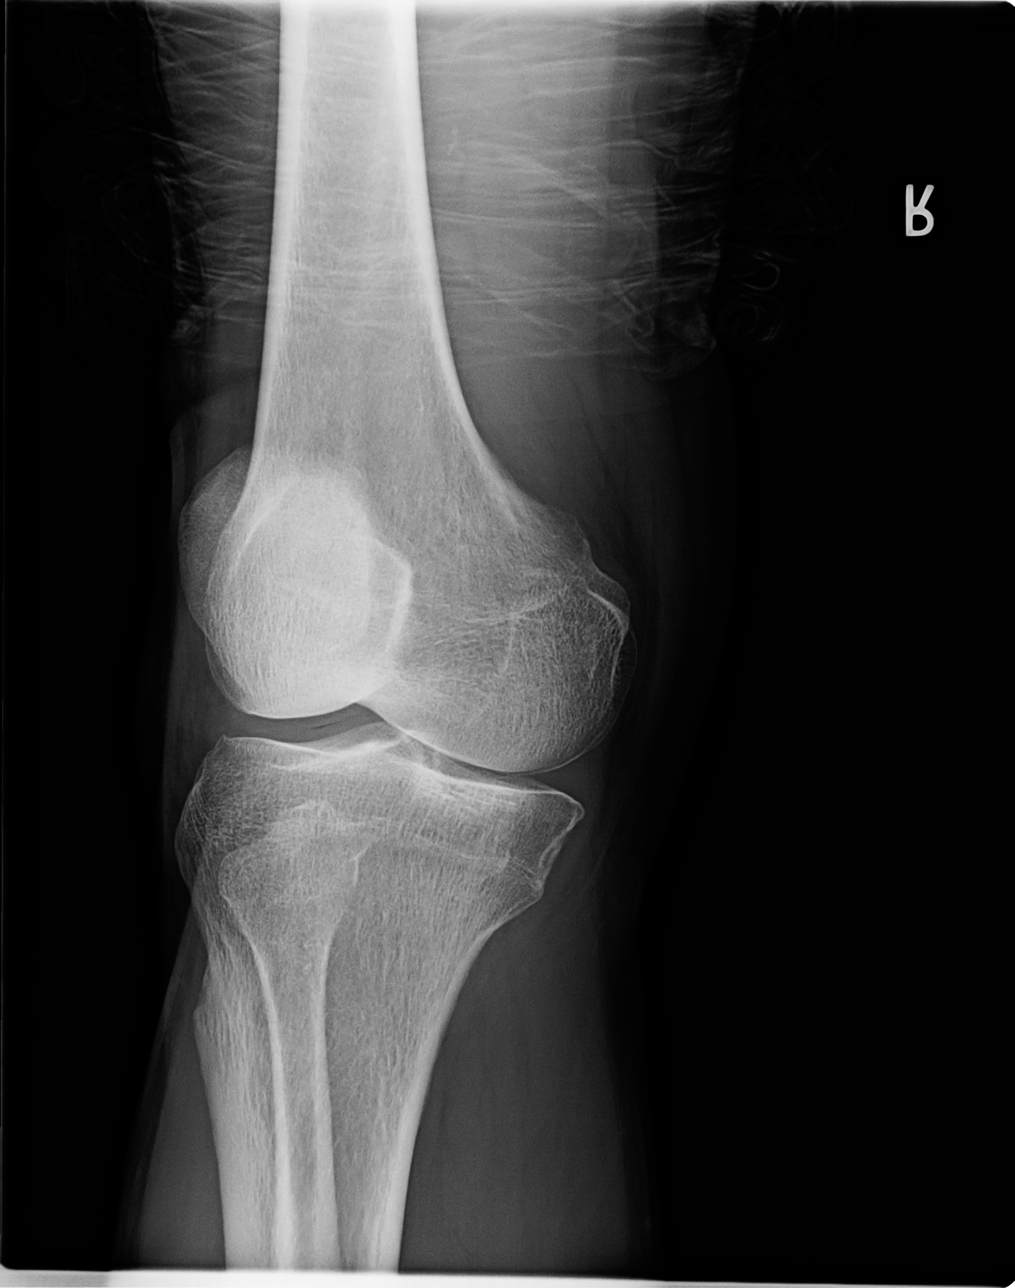

[view not recorded (4 of 4)]
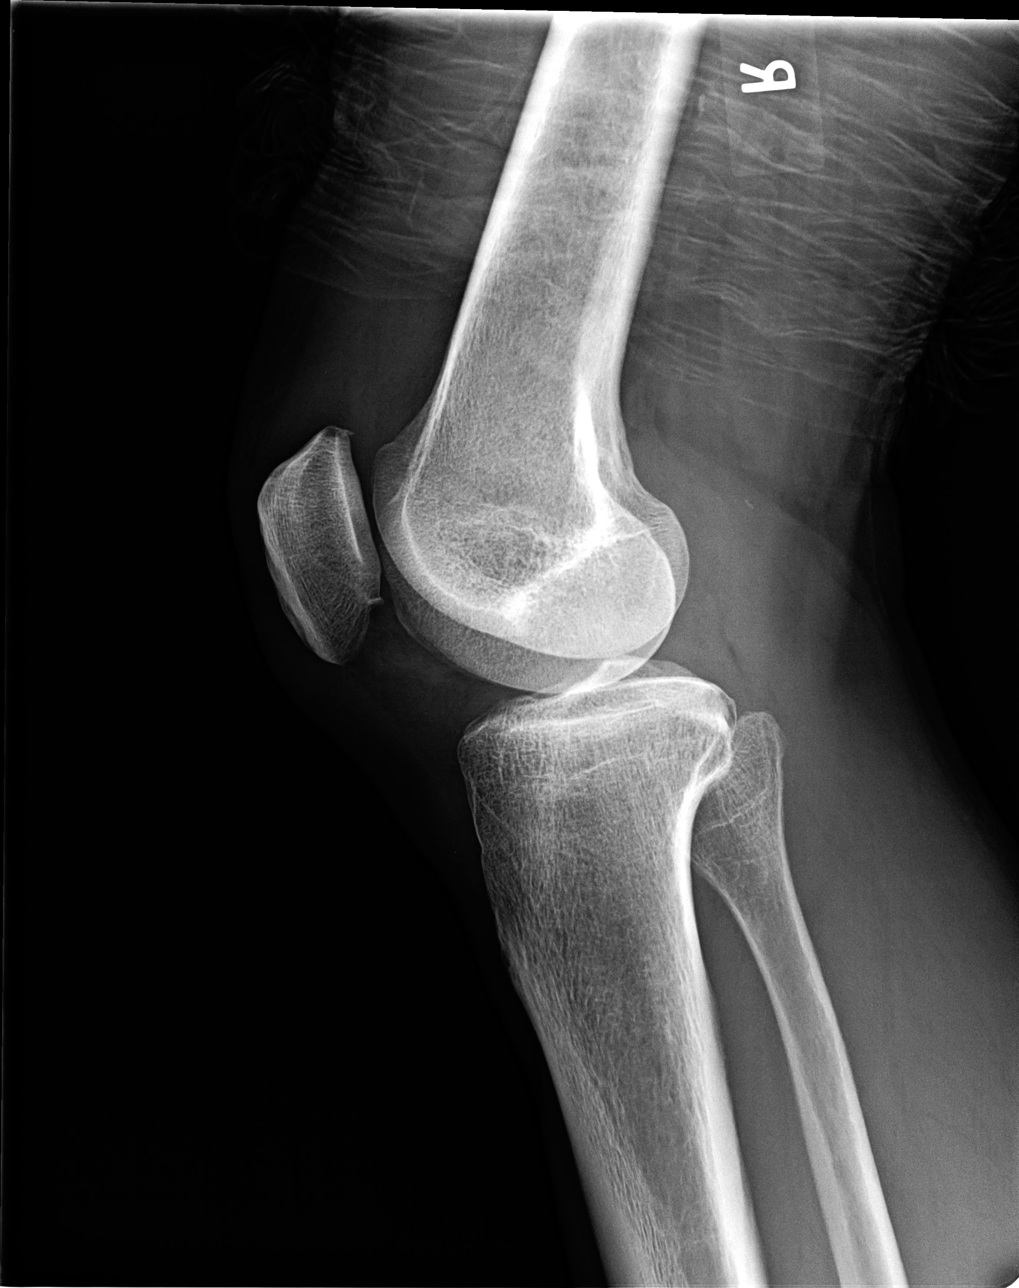

[4 of 4 positions shown; findings below may reference images not displayed]

FINDINGS: Four views of the right knee submitted.  No acute
fracture or subluxation.  Minimal narrowing of medial joint
compartment.
IMPRESSION: No acute fracture or subluxation.  Minimal narrowing of medial
joint compartment.

Clinically significant discrepancy from primary report, if
provided: None

## 2012-08-17 IMAGING — CR DG WRIST COMPLETE 3+V*L*
3 series · 3 of 3 positions shown · non-contrast
Comparison: None.

CLINICAL DATA: Left wrist pain

LEFT WRIST - COMPLETE 3+ VIEW

[view not recorded (1 of 3)]
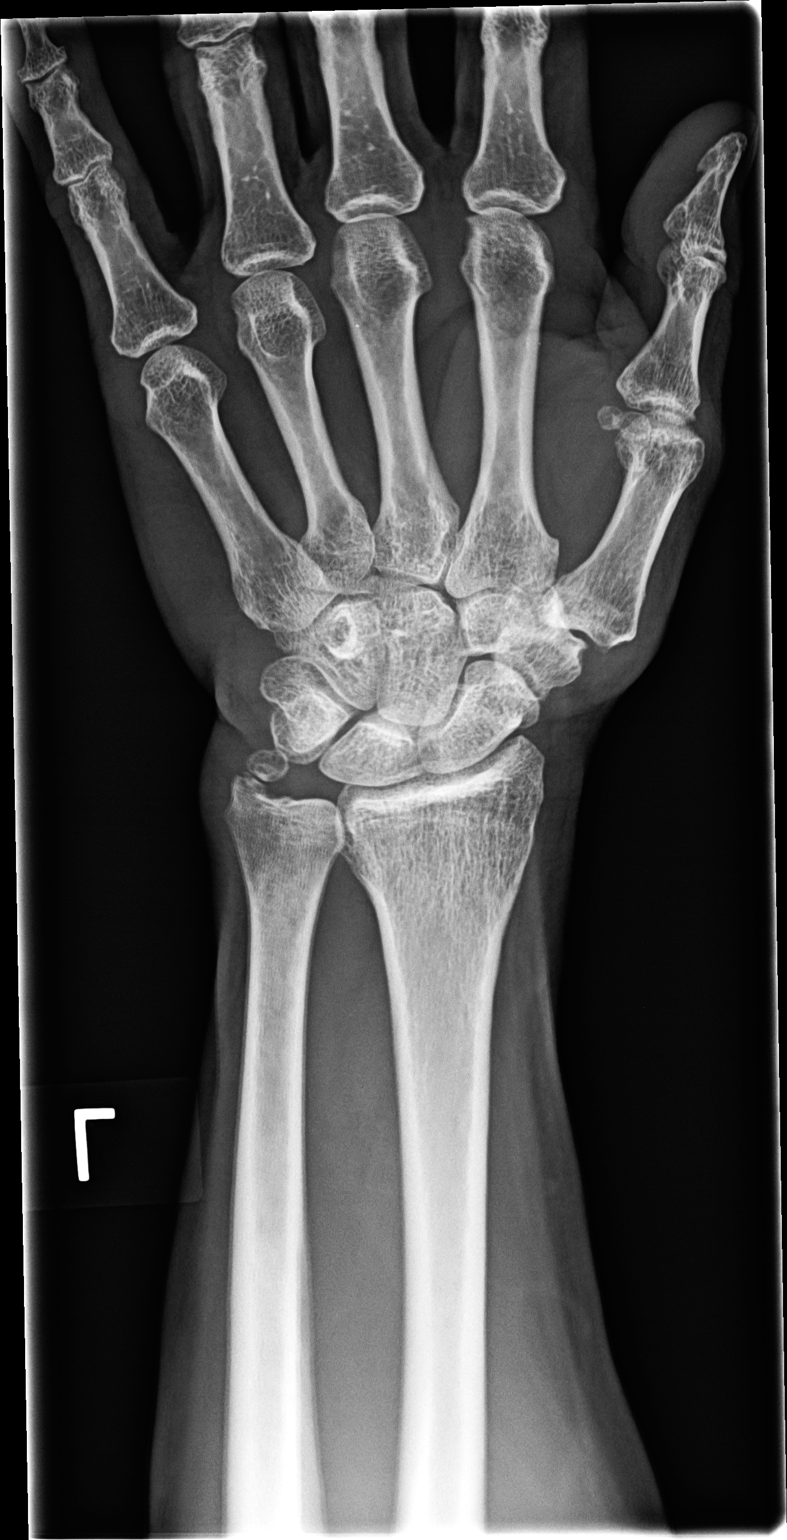

[view not recorded (2 of 3)]
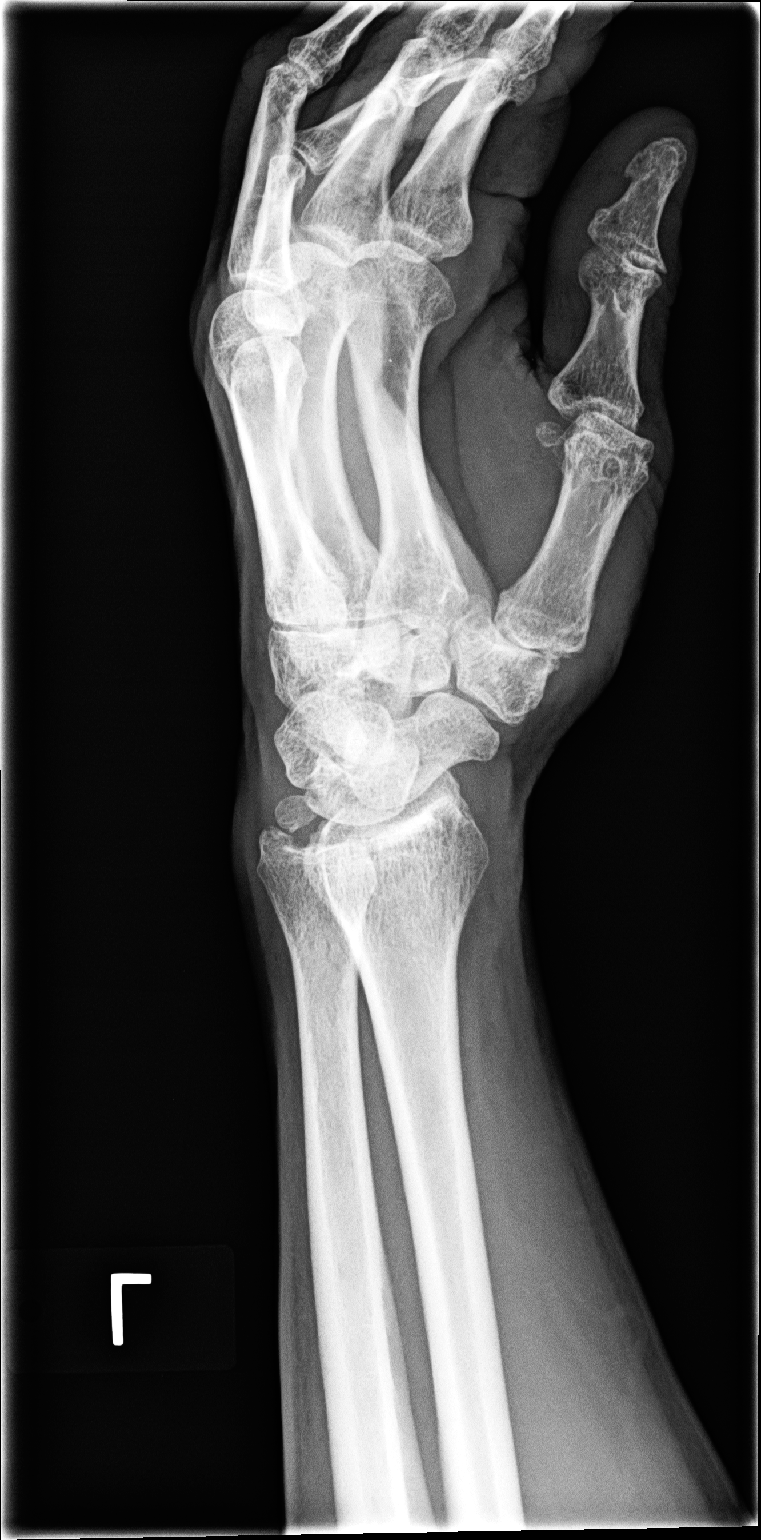

[view not recorded (3 of 3)]
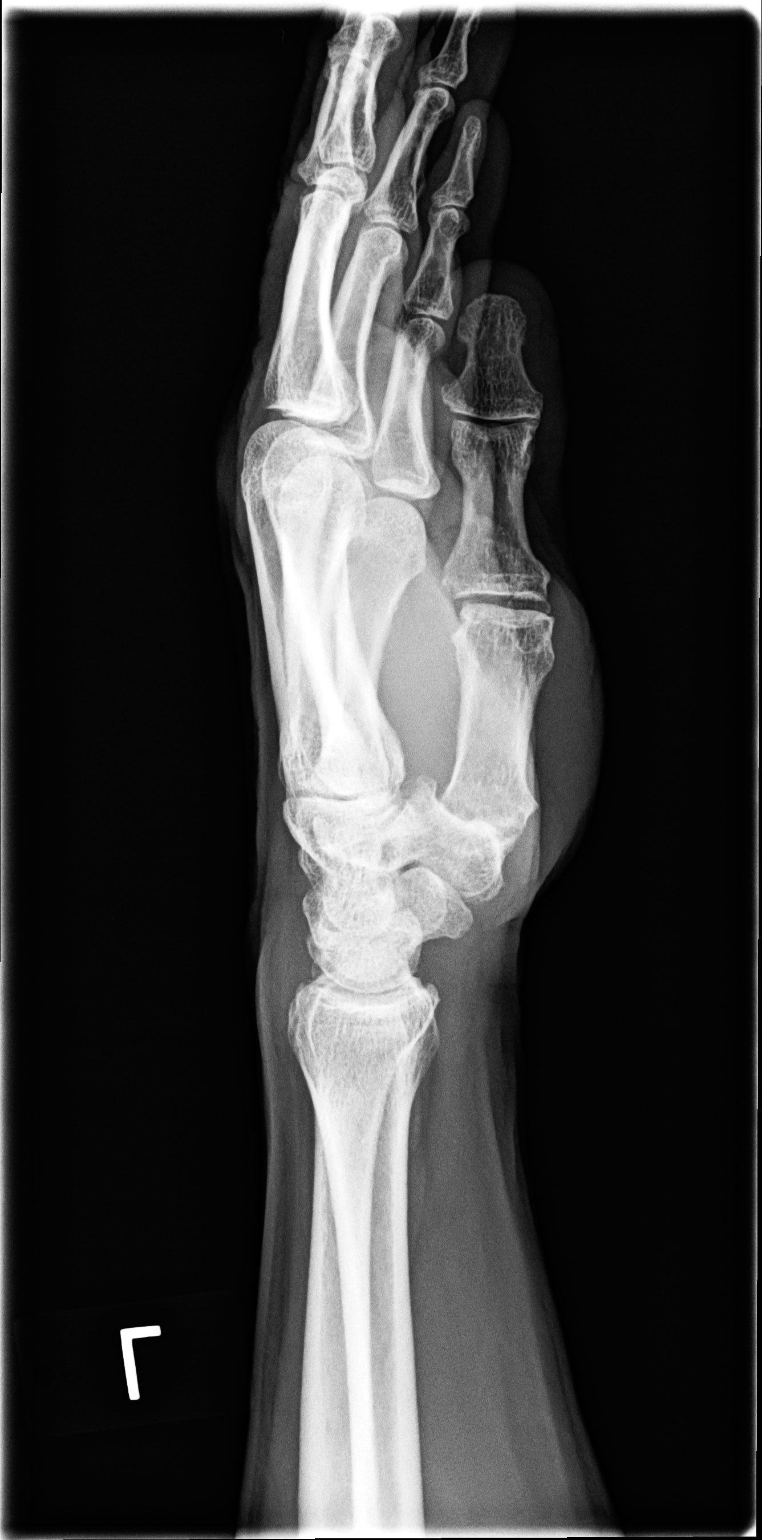

[3 of 3 positions shown; findings below may reference images not displayed]

FINDINGS: Three views of the left wrist submitted.  Mild narrowing
of radiocarpal joint.  Mild degenerative changes first carpal
metacarpal joint.  No acute fracture or subluxation.
IMPRESSION: No acute fracture or subluxation.  Degenerative changes as
described above.

Clinically significant discrepancy from primary report, if
provided: None

## 2012-08-17 MED ORDER — CYCLOBENZAPRINE HCL 5 MG PO TABS: 5.0000 mg | ORAL_TABLET | Freq: Three times a day (TID) | ORAL | Status: DC | PRN

## 2012-08-17 MED ORDER — PREDNISONE 50 MG PO TABS: ORAL_TABLET | ORAL | Status: DC

## 2012-08-17 NOTE — Progress Notes (Signed)
  Subjective:    Patient ID: Keith Gwyneth Sprout., male    DOB: 06-30-1940, 72 y.o.   MRN: [id number]  HPI  Patient presents today with chief complaint of joint pain in multiple joints. Patient states symptoms have been present for the past 3-4 months. Patient is predominantly in the lower back. Patient states that pain is constant throughout the day and does seem to be relieved at night. Some, albeit minimal radicular symptoms. Patient denies any distal numbness or paresthesias. Patient also was having some pain in his right shoulder, bilateral knees, left wrist. Patient denies any prior history of joint pains or arthritis in the past. However, patient does report that he ran up to 5-10 miles per day up to 3 or 4 years ago. No recent strenuous activity. Pain is described predominantly as an ache and has progressively worsened over the past 3-4 months. Patient struck multiple medications including ibuprofen and 80 pounds with minimal relief in symptoms. Patient denies any recent infections. Patient does have a history of BPH. Urinary symptoms have been relatively unchanged. No dysuria.  No rashes. Patient does report that his mom had severe arthritis in her later years. No known history of autoimmune disease in himself or his family.   Review of Systems  All other systems reviewed and are negative.       Objective:   Physical Exam  Constitutional: He appears well-developed and well-nourished.  HENT:  Head: Normocephalic and atraumatic.  Eyes: Conjunctivae are normal. Pupils are equal, round, and reactive to light.  Neck: Normal range of motion.  Cardiovascular: Normal rate and regular rhythm.   Pulmonary/Chest: Effort normal.  Abdominal: Soft.  Musculoskeletal:       Arms:      Legs: Positive tenderness palpation along the paraspinal muscles bilaterally. No step offs on palpation of vertebrae. Positive paraspinal muscle pain with hip flexion bilaterally. Faber  negative. Neurovascularly intact distally  Mild tenderness palpation along the knees bilaterally. Negative painful patellar compression.  Mild left wrist tenderness palpation predominantly on the radial side/thumb.   [facility] reading (PRIMARY) by  Dr. Alvester Morin                                  Preliminary read of multiple joint films showing generalized gender changes. No acute fracture or dislocation noted.        Assessment & Plan:  Pain, joint, multiple sites - Plan: DG Lumbar Spine Complete, DG Shoulder Right, DG Knee Complete 4 Views Right, DG Knee Complete 4 Views Left, DG Wrist Complete Left, CBC with Differential, Sedimentation Rate, Comprehensive metabolic panel, TSH, POCT urinalysis dipstick, Urine culture, ANA, CANCELED: ANA Comprehensive Panel  Differential diagnosis remains fairly broad for symptoms including osteoarthritis, inflammatory arthritis, gout, infectious arthritis. Some concern for rotator cuff symptoms and right shoulder  Exam seems most consistent with a musculoskeletal strain of the lumbar spine as well as general osteoarthritis flare. We'll check baseline labs including CBC, sedimentation rate, ANA, uric acid, CMET. Will also check urinalysis for any signs of infection. We'll also check baseline plain films including lumbar spine, bilateral knee x-rays, right shoulder x-ray, left wrist x-ray. We'll start patient on prednisone for treatment in addition to Flexeril for muscular component. Plan for followup pending x-rays and blood work. Consider followup with sports medicine versus ortho if this becomes a recurrent issue.

## 2012-08-18 LAB — URINE CULTURE
Colony Count: NO GROWTH
Organism ID, Bacteria: NO GROWTH

## 2012-08-18 LAB — ANA: Anti Nuclear Antibody(ANA): NEGATIVE

## 2012-08-28 ENCOUNTER — Telehealth: Payer: Self-pay | Admitting: *Deleted

## 2012-08-28 NOTE — Telephone Encounter (Signed)
APPT MADE

## 2012-08-29 ENCOUNTER — Encounter: Payer: Self-pay | Admitting: Physician Assistant

## 2012-08-29 ENCOUNTER — Telehealth: Payer: Self-pay | Admitting: Physician Assistant

## 2012-08-29 ENCOUNTER — Ambulatory Visit (INDEPENDENT_AMBULATORY_CARE_PROVIDER_SITE_OTHER): Payer: Medicare Other | Admitting: Physician Assistant

## 2012-08-29 VITALS — BP 120/67 | HR 64 | Temp 97.8°F | Ht 66.5 in | Wt 156.0 lb

## 2012-08-29 DIAGNOSIS — M549 Dorsalgia, unspecified: Secondary | ICD-10-CM

## 2012-08-29 MED ORDER — PREDNISONE (PAK) 10 MG PO TABS: 10.0000 mg | ORAL_TABLET | Freq: Every day | ORAL | Status: DC

## 2012-08-29 NOTE — Progress Notes (Signed)
Subjective:     Patient ID: Keith Gwyneth Sprout., male   DOB: October 26, 1940, 72 y.o.   MRN: [id number]  HPI Pt here due to cont back pain He had full work up done by Dr Alvester Morin and sx were felt to be degen in nature Pt states he took the med and had several days of relief but sx returned Now with L lower back pain for 3 months Denies any injury  Review of Systems  All other systems reviewed and are negative.       Objective:   Physical Exam  Nursing note and vitals reviewed.  No ecchy/edema to the L -spine + TTP L -spine FROM but sx at extremes SLR neg Muscle strength good/= lower ext DTR 1+/= lower ext    Assessment:     1. Back pain        Plan:     Will try Sterapred dose pack Heat/Ice Stretching If sx cont referral to Ortho

## 2012-08-29 NOTE — Patient Instructions (Signed)
Back Pain, Adult  Low back pain is very common. About 1 in 5 people have back pain. The cause of low back pain is rarely dangerous. The pain often gets better over time. About half of people with a sudden onset of back pain feel better in just 2 weeks. About 8 in 10 people feel better by 6 weeks.   CAUSES  Some common causes of back pain include:  · Strain of the muscles or ligaments supporting the spine.  · Wear and tear (degeneration) of the spinal discs.  · Arthritis.  · Direct injury to the back.  DIAGNOSIS  Most of the time, the direct cause of low back pain is not known. However, back pain can be treated effectively even when the exact cause of the pain is unknown. Answering your caregiver's questions about your overall health and symptoms is one of the most accurate ways to make sure the cause of your pain is not dangerous. If your caregiver needs more information, he or she may order lab work or imaging tests (X-rays or MRIs). However, even if imaging tests show changes in your back, this usually does not require surgery.  HOME CARE INSTRUCTIONS  For many people, back pain returns. Since low back pain is rarely dangerous, it is often a condition that people can learn to manage on their own.   · Remain active. It is stressful on the back to sit or stand in one place. Do not sit, drive, or stand in one place for more than 30 minutes at a time. Take short walks on level surfaces as soon as pain allows. Try to increase the length of time you walk each day.  · Do not stay in bed. Resting more than 1 or 2 days can delay your recovery.  · Do not avoid exercise or work. Your body is made to move. It is not dangerous to be active, even though your back may hurt. Your back will likely heal faster if you return to being active before your pain is gone.  · Pay attention to your body when you  bend and lift. Many people have less discomfort when lifting if they bend their knees, keep the load close to their bodies, and  avoid twisting. Often, the most comfortable positions are those that put less stress on your recovering back.  · Find a comfortable position to sleep. Use a firm mattress and lie on your side with your knees slightly bent. If you lie on your back, put a pillow under your knees.  · Only take over-the-counter or prescription medicines as directed by your caregiver. Over-the-counter medicines to reduce pain and inflammation are often the most helpful. Your caregiver may prescribe muscle relaxant drugs. These medicines help dull your pain so you can more quickly return to your normal activities and healthy exercise.  · Put ice on the injured area.  · Put ice in a plastic bag.  · Place a towel between your skin and the bag.  · Leave the ice on for 15-20 minutes, 3-4 times a day for the first 2 to 3 days. After that, ice and heat may be alternated to reduce pain and spasms.  · Ask your caregiver about trying back exercises and gentle massage. This may be of some benefit.  · Avoid feeling anxious or stressed. Stress increases muscle tension and can worsen back pain. It is important to recognize when you are anxious or stressed and learn ways to manage it. Exercise is a great option.  SEEK MEDICAL CARE IF:  · You have pain that is not relieved with rest or   medicine.  · You have pain that does not improve in 1 week.  · You have new symptoms.  · You are generally not feeling well.  SEEK IMMEDIATE MEDICAL CARE IF:   · You have pain that radiates from your back into your legs.  · You develop new bowel or bladder control problems.  · You have unusual weakness or numbness in your arms or legs.  · You develop nausea or vomiting.  · You develop abdominal pain.  · You feel faint.  Document Released: 02/15/2005 Document Revised: 08/17/2011 Document Reviewed: 07/06/2010  [employer]® Patient Information ©2014 [employer].

## 2012-08-30 NOTE — Telephone Encounter (Signed)
I did not  Say Tylenol 3- I said pain med Can do Ultram 50mg  1-2 po qid prn #[id number]

## 2012-08-31 MED ORDER — TRAMADOL HCL 50 MG PO TABS: ORAL_TABLET | ORAL | Status: DC

## 2012-08-31 NOTE — Telephone Encounter (Signed)
Patient reports that prednisone is helping a lot.  Informed him that ultram was sent over electronically.  He will f/u as needed.

## 2012-09-04 ENCOUNTER — Ambulatory Visit: Payer: Medicare Other | Admitting: General Practice

## 2012-09-05 ENCOUNTER — Telehealth: Payer: Self-pay | Admitting: Nurse Practitioner

## 2012-09-05 NOTE — Telephone Encounter (Signed)
APPT MADE

## 2012-09-06 ENCOUNTER — Ambulatory Visit (INDEPENDENT_AMBULATORY_CARE_PROVIDER_SITE_OTHER): Payer: Medicare Other | Admitting: General Practice

## 2012-09-06 ENCOUNTER — Encounter: Payer: Self-pay | Admitting: General Practice

## 2012-09-06 DIAGNOSIS — H6690 Otitis media, unspecified, unspecified ear: Secondary | ICD-10-CM

## 2012-09-06 MED ORDER — CIPROFLOXACIN-DEXAMETHASONE 0.3-0.1 % OT SUSP: 4.0000 [drp] | Freq: Two times a day (BID) | OTIC | Status: AC

## 2012-09-06 MED ORDER — AMOXICILLIN 500 MG PO CAPS: 500.0000 mg | ORAL_CAPSULE | Freq: Two times a day (BID) | ORAL | Status: AC

## 2012-09-06 NOTE — Progress Notes (Signed)
  Subjective:    Patient ID: Keith Gwyneth Sprout., male    DOB: 1940-09-25, 72 y.o.   MRN: [id number]  Otalgia  There is pain in the left ear. This is a new problem. The current episode started in the past 7 days. The problem occurs constantly. The problem has been gradually worsening. There has been no fever. The pain is at a severity of 8/10. The pain is moderate. Pertinent negatives include no coughing, diarrhea, drainage, ear discharge, headaches, hearing loss or rhinorrhea. He has tried ear drops for the symptoms.      Review of Systems  Constitutional: Negative for fever and chills.  HENT: Positive for ear pain. Negative for hearing loss, congestion, rhinorrhea, sneezing, postnasal drip, sinus pressure and ear discharge.   Eyes: Negative for pain, discharge and itching.  Respiratory: Negative for cough.   Cardiovascular: Negative for chest pain and palpitations.  Gastrointestinal: Negative for diarrhea.  Neurological: Positive for dizziness. Negative for weakness and headaches.       Slight dizziness since ear pain       Objective:   Physical Exam  Constitutional: He is oriented to person, place, and time. He appears well-developed and well-nourished.  HENT:  Head: Normocephalic and atraumatic.  Right Ear: Tympanic membrane is erythematous.  Left Ear: Tympanic membrane is erythematous.  Mouth/Throat: No posterior oropharyngeal erythema.  Cardiovascular: Normal rate, regular rhythm and normal heart sounds.   Pulmonary/Chest: Effort normal and breath sounds normal.  Neurological: He is alert and oriented to person, place, and time.  Skin: Skin is warm and dry.  Psychiatric: He has a normal mood and affect.          Assessment & Plan:  1. Otitis media, bilateral - amoxicillin (AMOXIL) 500 MG capsule; Take 1 capsule (500 mg total) by mouth 2 (two) times daily.  Dispense: 20 capsule; Refill: 0 - ciprofloxacin-dexamethasone (CIPRODEX) otic suspension; Place 4 drops into both  ears 2 (two) times daily.  Dispense: 7.5 mL; Refill: 1 -keep ears clean and dry -refrain from water getting in ears -RTO for follow up appointment on Monday and sooner if symptoms worsen -Patient verbalized understanding -Coralie Keens, FNP-C

## 2012-09-06 NOTE — Patient Instructions (Addendum)

## 2012-09-11 ENCOUNTER — Telehealth: Payer: Self-pay | Admitting: Physician Assistant

## 2012-09-12 ENCOUNTER — Other Ambulatory Visit: Payer: Self-pay | Admitting: *Deleted

## 2012-09-12 MED ORDER — TAMSULOSIN HCL 0.4 MG PO CAPS: 0.4000 mg | ORAL_CAPSULE | Freq: Every day | ORAL | Status: DC

## 2012-09-12 NOTE — Telephone Encounter (Signed)
PT CALLED AND STATED HE LOST RX FOR FLOMAX. WANTS YOU TO CALL IN.

## 2012-09-19 ENCOUNTER — Telehealth: Payer: Self-pay | Admitting: Physician Assistant

## 2012-09-19 DIAGNOSIS — M549 Dorsalgia, unspecified: Secondary | ICD-10-CM

## 2012-09-29 ENCOUNTER — Other Ambulatory Visit: Payer: Self-pay | Admitting: Family Medicine

## 2012-09-29 DIAGNOSIS — M549 Dorsalgia, unspecified: Secondary | ICD-10-CM

## 2012-09-29 MED ORDER — MELOXICAM 7.5 MG PO TABS
7.5000 mg | ORAL_TABLET | Freq: Every day | ORAL | Status: DC
Start: 2012-09-29 — End: 2012-12-12

## 2012-09-29 NOTE — Telephone Encounter (Signed)
Will order physical therapy and meloxicam 7.5 mg daily # 15. Will need to be seen in 2 weeks.

## 2012-09-29 NOTE — Telephone Encounter (Signed)
Ortho referral sent. Patient states that flexeril, ibuprofen, and tylenol don't help pain. Ultram upsets his stomach. He wants to see if there is anything else he can take.

## 2012-11-09 ENCOUNTER — Other Ambulatory Visit: Payer: Self-pay | Admitting: *Deleted

## 2012-11-13 ENCOUNTER — Other Ambulatory Visit: Payer: Self-pay

## 2012-11-13 MED ORDER — ROSUVASTATIN CALCIUM 10 MG PO TABS: 10.0000 mg | ORAL_TABLET | Freq: Every day | ORAL | Status: DC

## 2012-11-13 NOTE — Telephone Encounter (Signed)
Last seen 09/06/12  Keith Olson  Last lipid level I can find is 2009

## 2012-11-24 ENCOUNTER — Other Ambulatory Visit: Payer: Self-pay | Admitting: Orthopedic Surgery

## 2012-11-24 DIAGNOSIS — M79605 Pain in left leg: Secondary | ICD-10-CM

## 2012-12-04 ENCOUNTER — Other Ambulatory Visit: Payer: No Typology Code available for payment source

## 2012-12-12 ENCOUNTER — Encounter: Payer: Self-pay | Admitting: Family Medicine

## 2012-12-12 ENCOUNTER — Ambulatory Visit (INDEPENDENT_AMBULATORY_CARE_PROVIDER_SITE_OTHER): Payer: Medicare Other | Admitting: Family Medicine

## 2012-12-12 ENCOUNTER — Telehealth: Payer: Self-pay | Admitting: Family Medicine

## 2012-12-12 ENCOUNTER — Encounter (INDEPENDENT_AMBULATORY_CARE_PROVIDER_SITE_OTHER): Payer: Self-pay

## 2012-12-12 VITALS — BP 133/72 | HR 72 | Temp 97.1°F | Ht 66.5 in | Wt 158.0 lb

## 2012-12-12 DIAGNOSIS — R102 Pelvic and perineal pain: Secondary | ICD-10-CM

## 2012-12-12 DIAGNOSIS — M25559 Pain in unspecified hip: Secondary | ICD-10-CM

## 2012-12-12 DIAGNOSIS — M129 Arthropathy, unspecified: Secondary | ICD-10-CM

## 2012-12-12 DIAGNOSIS — R300 Dysuria: Secondary | ICD-10-CM

## 2012-12-12 DIAGNOSIS — N419 Inflammatory disease of prostate, unspecified: Secondary | ICD-10-CM

## 2012-12-12 DIAGNOSIS — M1990 Unspecified osteoarthritis, unspecified site: Secondary | ICD-10-CM

## 2012-12-12 DIAGNOSIS — R3911 Hesitancy of micturition: Secondary | ICD-10-CM

## 2012-12-12 LAB — POCT URINALYSIS DIPSTICK
Bilirubin, UA: NEGATIVE
Blood, UA: NEGATIVE
Glucose, UA: NEGATIVE
Ketones, UA: NEGATIVE
Leukocytes, UA: NEGATIVE
Nitrite, UA: NEGATIVE
Protein, UA: NEGATIVE
Spec Grav, UA: 1.01
Urobilinogen, UA: NEGATIVE
pH, UA: 7

## 2012-12-12 LAB — POCT UA - MICROSCOPIC ONLY
Bacteria, U Microscopic: NEGATIVE
Casts, Ur, LPF, POC: NEGATIVE
Crystals, Ur, HPF, POC: NEGATIVE
Mucus, UA: NEGATIVE
RBC, urine, microscopic: NEGATIVE
WBC, Ur, HPF, POC: NEGATIVE
Yeast, UA: NEGATIVE

## 2012-12-12 MED ORDER — CIPROFLOXACIN HCL 500 MG PO TABS: 500.0000 mg | ORAL_TABLET | Freq: Two times a day (BID) | ORAL | Status: DC

## 2012-12-12 MED ORDER — MELOXICAM 15 MG PO TABS: 15.0000 mg | ORAL_TABLET | Freq: Every day | ORAL | Status: DC

## 2012-12-12 NOTE — Patient Instructions (Signed)
Back Pain, Adult  Low back pain is very common. About 1 in 5 people have back pain. The cause of low back pain is rarely dangerous. The pain often gets better over time. About half of people with a sudden onset of back pain feel better in just 2 weeks. About 8 in 10 people feel better by 6 weeks.   CAUSES  Some common causes of back pain include:  · Strain of the muscles or ligaments supporting the spine.  · Wear and tear (degeneration) of the spinal discs.  · Arthritis.  · Direct injury to the back.  DIAGNOSIS  Most of the time, the direct cause of low back pain is not known. However, back pain can be treated effectively even when the exact cause of the pain is unknown. Answering your caregiver's questions about your overall health and symptoms is one of the most accurate ways to make sure the cause of your pain is not dangerous. If your caregiver needs more information, he or she may order lab work or imaging tests (X-rays or MRIs). However, even if imaging tests show changes in your back, this usually does not require surgery.  HOME CARE INSTRUCTIONS  For many people, back pain returns. Since low back pain is rarely dangerous, it is often a condition that people can learn to manage on their own.   · Remain active. It is stressful on the back to sit or stand in one place. Do not sit, drive, or stand in one place for more than 30 minutes at a time. Take short walks on level surfaces as soon as pain allows. Try to increase the length of time you walk each day.  · Do not stay in bed. Resting more than 1 or 2 days can delay your recovery.  · Do not avoid exercise or work. Your body is made to move. It is not dangerous to be active, even though your back may hurt. Your back will likely heal faster if you return to being active before your pain is gone.  · Pay attention to your body when you  bend and lift. Many people have less discomfort when lifting if they bend their knees, keep the load close to their bodies, and  avoid twisting. Often, the most comfortable positions are those that put less stress on your recovering back.  · Find a comfortable position to sleep. Use a firm mattress and lie on your side with your knees slightly bent. If you lie on your back, put a pillow under your knees.  · Only take over-the-counter or prescription medicines as directed by your caregiver. Over-the-counter medicines to reduce pain and inflammation are often the most helpful. Your caregiver may prescribe muscle relaxant drugs. These medicines help dull your pain so you can more quickly return to your normal activities and healthy exercise.  · Put ice on the injured area.  · Put ice in a plastic bag.  · Place a towel between your skin and the bag.  · Leave the ice on for 15-20 minutes, 3-4 times a day for the first 2 to 3 days. After that, ice and heat may be alternated to reduce pain and spasms.  · Ask your caregiver about trying back exercises and gentle massage. This may be of some benefit.  · Avoid feeling anxious or stressed. Stress increases muscle tension and can worsen back pain. It is important to recognize when you are anxious or stressed and learn ways to manage it. Exercise is a great option.  SEEK MEDICAL CARE IF:  · You have pain that is not relieved with rest or   medicine.  · You have pain that does not improve in 1 week.  · You have new symptoms.  · You are generally not feeling well.  SEEK IMMEDIATE MEDICAL CARE IF:   · You have pain that radiates from your back into your legs.  · You develop new bowel or bladder control problems.  · You have unusual weakness or numbness in your arms or legs.  · You develop nausea or vomiting.  · You develop abdominal pain.  · You feel faint.  Document Released: 02/15/2005 Document Revised: 08/17/2011 Document Reviewed: 07/06/2010  [employer]® Patient Information ©2014 [employer].

## 2012-12-12 NOTE — Progress Notes (Signed)
  Subjective:    Patient ID: Keith Olson., male    DOB: 04-May-1940, 72 y.o.   MRN: [id number]  HPI This 72 y.o. male presents for evaluation of pain and discomfort in the pelvic Area and difficulty with voiding.  He has hx of prostatitis and is having same Sx's.  He has been having arthritis that is bothering him.   Review of Systems C/o dysuria, back pain, frequency, and feeling tired. No chest pain, SOB, HA, dizziness, vision change, N/V, diarrhea, constipation,  myalgias, arthralgias or rash.     Objective:   Physical Exam Vital signs noted  Well developed well nourished male.  HEENT - Head atraumatic Normocephalic                Eyes - PERRLA, Conjuctiva - clear Sclera- Clear EOMI                Ears - EAC's Wnl TM's Wnl Gross Hearing WNL                Nose - Nares patent                 Throat - oropharanx wnl Respiratory - Lungs CTA bilateral Cardiac - RRR S1 and S2 without murmur GI - Abdomen soft Nontender and bowel sounds active x 4 Extremities - No edema. Neuro - Grossly intact.       Results for orders placed in visit on 12/12/12  POCT URINALYSIS DIPSTICK      Result Value Range   Color, UA yellow     Clarity, UA clear     Glucose, UA neg     Bilirubin, UA neg     Ketones, UA neg     Spec Grav, UA 1.010     Blood, UA neg     pH, UA 7.0     Protein, UA neg     Urobilinogen, UA negative     Nitrite, UA neg     Leukocytes, UA Negative    POCT UA - MICROSCOPIC ONLY      Result Value Range   WBC, Ur, HPF, POC neg     RBC, urine, microscopic neg     Bacteria, U Microscopic neg     Mucus, UA neg     Epithelial cells, urine per micros occ     Crystals, Ur, HPF, POC neg     Casts, Ur, LPF, POC neg     Yeast, UA neg     Assessment & Plan:  Pelvic pain - Plan: POCT urinalysis dipstick, POCT UA - Microscopic Only, ciprofloxacin (CIPRO) 500 MG tablet  Prostatitis - Plan: ciprofloxacin (CIPRO) 500 MG tablet po bid x 3 weeks.  Arthritis - Plan:  meloxicam (MOBIC) 15 MG tablet one po qd  Deatra Canter FNP

## 2012-12-13 ENCOUNTER — Other Ambulatory Visit: Payer: Self-pay | Admitting: Family Medicine

## 2012-12-13 MED ORDER — HYDROCODONE-ACETAMINOPHEN 5-325 MG PO TABS: 1.0000 | ORAL_TABLET | Freq: Four times a day (QID) | ORAL | Status: DC | PRN

## 2012-12-13 NOTE — Telephone Encounter (Signed)
Rx'd him norco for pain and he can come in and pick it up.

## 2012-12-13 NOTE — Telephone Encounter (Signed)
Patient aware on your desk to sign and put up front left message

## 2012-12-14 ENCOUNTER — Other Ambulatory Visit: Payer: Self-pay | Admitting: Family Medicine

## 2012-12-14 MED ORDER — HYDROCODONE-ACETAMINOPHEN 5-325 MG PO TABS: 1.0000 | ORAL_TABLET | Freq: Four times a day (QID) | ORAL | Status: DC | PRN

## 2012-12-29 ENCOUNTER — Ambulatory Visit (INDEPENDENT_AMBULATORY_CARE_PROVIDER_SITE_OTHER): Payer: Medicare Other | Admitting: Family Medicine

## 2012-12-29 ENCOUNTER — Encounter: Payer: Self-pay | Admitting: Family Medicine

## 2012-12-29 VITALS — BP 153/79 | HR 54 | Temp 97.7°F | Wt 158.8 lb

## 2012-12-29 DIAGNOSIS — N419 Inflammatory disease of prostate, unspecified: Secondary | ICD-10-CM

## 2012-12-29 DIAGNOSIS — M549 Dorsalgia, unspecified: Secondary | ICD-10-CM

## 2012-12-29 DIAGNOSIS — R102 Pelvic and perineal pain: Secondary | ICD-10-CM

## 2012-12-29 MED ORDER — HYDROCODONE-ACETAMINOPHEN 5-325 MG PO TABS: 1.0000 | ORAL_TABLET | Freq: Four times a day (QID) | ORAL | Status: DC | PRN

## 2012-12-29 MED ORDER — CIPROFLOXACIN HCL 500 MG PO TABS: 500.0000 mg | ORAL_TABLET | Freq: Two times a day (BID) | ORAL | Status: DC

## 2012-12-29 MED ORDER — TAMSULOSIN HCL 0.4 MG PO CAPS: 0.8000 mg | ORAL_CAPSULE | Freq: Every day | ORAL | Status: DC

## 2012-12-29 NOTE — Patient Instructions (Signed)
Prostatitis  The prostate gland is about the size and shape of a walnut. It is located just below your bladder. It produces one of the components of semen, which is made up of sperm and the fluids that help nourish and transport it out from the testicles. Prostatitis is redness, soreness, and swelling (inflammation) of the prostate gland.   There are 3 types of prostatitis:  · Acute bacterial prostatitis This is the least common type of prostatitis. It starts quickly and usually leads to a bladder infection. It can occur at any age.  · Chronic bacterial prostatitis This is a persistent bacterial infection in the prostate.  It usually develops from repeated acute bacterial prostatitis or acute bacterial prostatitis that was not properly treated. It can occur in men of any age but is most common in middle-aged men whose prostate has begun to enlarge.   · Chronic prostatitis chronic pelvic pain syndrome This is the most common type of prostatitis. It is inflammation of the prostate gland that is not caused by a bacterial infection. The cause is unknown.  CAUSES  The cause of acute and chronic bacterial prostatitis is a bacterial infection. The exact cause of chronic prostatitis and chronic pelvic pain syndrome and asymptomatic inflammatory prostatitis is unknown.   SYMPTOMS   Symptoms can vary depending upon the type of prostatitis that exists. There can also be overlap in symptoms. Possible symptoms for each type of prostatitis are listed below.  Acute bacterial prostatitis  · Painful urination.  · Fever or chills.  · Muscle or joint pains.  · Low back pain.  · Low abdominal pain.  · Inability to empty bladder completely.  · Sudden urge to urinate.  · Frequent urination.  · Difficulty starting urine stream.  · Weak urine stream.  · Discharge from the urethra.  · Dribbling after urination.  · Rectal pain.  · Pain in the testicles, penis, or tip of the penis.  · Pain in the space between the anus and scrotum  (perineum).  · Problems with sexual function.  · Painful ejaculation.  · Bloody semen.  Chronic bacterial prostatitis  · The symptoms are similar to those of acute bacterial prostatitis, but they usually are much less severe. Fever, chills, and muscle and joint pain are not associated with chronic bacterial prostatitis.  Chronic prostatitis chronic pelvic pain syndrome  · Symptoms typically include a dull ache in the scrotum and the perineum.  DIAGNOSIS   In order to diagnose prostatitis, your caregiver will ask about your symptoms. If acute or chronic bacterial prostatitis is suspected, a urine sample will be taken and tested (urinalysis). This is to see if there is bacteria in your urine. If the urinalysis result is negative for bacteria, your caregiver may use a finger to feel your prostate (digital rectal exam). This exam helps your caregiver determine if your prostate is swollen and tender.  TREATMENT   Treatment for prostatitis depends on the cause. If a bacterial infection is the cause, it can be treated with antibiotic medicine. In cases of chronic bacterial prostatitis, the use of antibiotics for up to 1 month may be necessary. Your caregiver may instruct you to take sitz baths to help relieve pain. A sitz bath is a bath of hot water in which your hips and buttocks are under water.  HOME CARE INSTRUCTIONS   · Take all medicines as directed by your caregiver.  · Take sitz baths as directed by your caregiver.  SEEK MEDICAL CARE   IF:   · Your symptoms get worse, not better.  · You have a fever.  SEEK IMMEDIATE MEDICAL CARE IF:   · You have chills.  · You feel nauseous or vomit.  · You feel lightheaded or faint.  · You are unable to urinate.  · You have blood or blood clots in your urine.  Document Released: 02/13/2000 Document Revised: 05/10/2011 Document Reviewed: 01/18/2011  [employer]® Patient Information ©2014 [employer].

## 2012-12-29 NOTE — Progress Notes (Signed)
  Subjective:    Patient ID: Keith Gwyneth Sprout., male    DOB: 1940/05/04, 72 y.o.   MRN: [id number]  HPI This 72 y.o. male presents for evaluation of pelvic pain, back pain, and pain when urinating. He has had problems with prostatitis and it has returned making it difficult to void and has Urinary frequency and back pain.  This is the 3rd bout in the last 3 months. He states the flomax is not working as well.  He also is having a back spasm.   Review of Systems C/o difficulty voiding, decreased urine stream, difficulty getting started, and polyuria.  C/o pelvic pain, and back pain. No chest pain, SOB, HA, dizziness, vision change, N/V, diarrhea, constipation or rash.     Objective:   Physical Exam Vital signs noted  Well developed well nourished male.  HEENT - Head atraumatic Normocephalic                Eyes - PERRLA, Conjuctiva - clear Sclera- Clear EOMI                Ears - EAC's Wnl TM's Wnl Gross Hearing WNL                Nose - Nares patent                 Throat - oropharanx wnl Respiratory - Lungs CTA bilateral Cardiac - RRR S1 and S2 without murmur GI - Abdomen soft tender in pelvic and lower abdomen w/o guarding. MS - Left lumbar spine with muscle spasm.  Procedure - Trigger Point Injection to left lumbar muscle spasm with one cc of depo medrol and 2 cc's of lidocaine.        Assessment & Plan:  Pelvic pain - Plan: ciprofloxacin (CIPRO) 500 MG tablet, HYDROcodone-acetaminophen (NORCO) 5-325 MG per tablet  Prostatitis - Plan: ciprofloxacin (CIPRO) 500 MG tablet, tamsulosin (FLOMAX) 0.4 MG CAPS capsule, Ambulatory referral to Urology, HYDROcodone-acetaminophen (NORCO) 5-325 MG per tablet, POCT UA - Microscopic Only, POCT urinalysis dipstick, Urine culture  Back pain - Plan: HYDROcodone-acetaminophen (NORCO) 5-325 MG per tablet  Deatra Canter FNP

## 2013-04-13 ENCOUNTER — Encounter: Payer: Self-pay | Admitting: Family Medicine

## 2013-04-13 ENCOUNTER — Ambulatory Visit (INDEPENDENT_AMBULATORY_CARE_PROVIDER_SITE_OTHER): Payer: Medicare Other | Admitting: General Practice

## 2013-04-13 VITALS — BP 116/67 | HR 57 | Temp 96.6°F | Ht 66.5 in | Wt 155.0 lb

## 2013-04-13 DIAGNOSIS — J0100 Acute maxillary sinusitis, unspecified: Secondary | ICD-10-CM

## 2013-04-13 MED ORDER — METHYLPREDNISOLONE ACETATE 80 MG/ML IJ SUSP
80.0000 mg | Freq: Once | INTRAMUSCULAR | Status: AC
  Administered 2013-04-13: 80 mg via INTRAMUSCULAR

## 2013-04-13 MED ORDER — PREDNISONE (PAK) 10 MG PO TABS: ORAL_TABLET | ORAL | Status: DC

## 2013-04-13 MED ORDER — AZITHROMYCIN 250 MG PO TABS: ORAL_TABLET | ORAL | Status: DC

## 2013-04-13 NOTE — Patient Instructions (Signed)

## 2013-04-13 NOTE — Progress Notes (Signed)
   Subjective:    Patient ID: Keith Toniann Fail., male    DOB: 11/16/1940, 73 y.o.   MRN: [id number]  Sinusitis This is a new problem. The current episode started 1 to 4 weeks ago (onset 3 weeks ago). The problem has been gradually worsening since onset. There has been no fever. Associated symptoms include congestion, coughing and sinus pressure. Pertinent negatives include no chills or shortness of breath. Past treatments include oral decongestants and acetaminophen.      Review of Systems  Constitutional: Negative for chills.  HENT: Positive for congestion and sinus pressure.   Respiratory: Positive for cough. Negative for chest tightness and shortness of breath.   Cardiovascular: Negative for chest pain and palpitations.       Objective:   Physical Exam  Constitutional: He is oriented to person, place, and time. He appears well-developed and well-nourished.  HENT:  Head: Normocephalic and atraumatic.  Right Ear: External ear normal.  Left Ear: External ear normal.  Nose: Right sinus exhibits maxillary sinus tenderness and frontal sinus tenderness. Left sinus exhibits maxillary sinus tenderness and frontal sinus tenderness.  Mouth/Throat: Oropharynx is clear and moist.  Cardiovascular: Normal rate, regular rhythm and normal heart sounds.   Pulmonary/Chest: Effort normal and breath sounds normal. No respiratory distress. He exhibits no tenderness.  Neurological: He is alert and oriented to person, place, and time.  Skin: Skin is warm and dry.  Psychiatric: He has a normal mood and affect.          Assessment & Plan:  1. Sinusitis, acute maxillary - methylPREDNISolone acetate (DEPO-MEDROL) injection 80 mg; Inject 1 mL (80 mg total) into the muscle once. - predniSONE (STERAPRED UNI-PAK) 10 MG tablet; Start on 04/14/13  Dispense: 21 tablet; Refill: 0 - azithromycin (ZITHROMAX) 250 MG tablet; Take as directed  Dispense: 6 tablet; Refill: 0 -RTO prn Patient verbalized  understanding Erby Pian, FNP-C

## 2013-05-11 ENCOUNTER — Telehealth: Payer: Self-pay | Admitting: Family Medicine

## 2013-05-18 NOTE — Telephone Encounter (Signed)
No return call since 3/13- pt may call back if still needs to be seen.

## 2013-07-17 ENCOUNTER — Other Ambulatory Visit: Payer: Self-pay | Admitting: Family Medicine

## 2013-07-20 ENCOUNTER — Telehealth: Payer: Self-pay | Admitting: Family Medicine

## 2013-07-20 NOTE — Telephone Encounter (Signed)
Appt given per patient request 

## 2013-07-26 ENCOUNTER — Encounter: Payer: Self-pay | Admitting: Family Medicine

## 2013-07-26 ENCOUNTER — Ambulatory Visit (INDEPENDENT_AMBULATORY_CARE_PROVIDER_SITE_OTHER): Payer: Medicare Other | Admitting: Family Medicine

## 2013-07-26 VITALS — BP 129/75 | HR 54 | Temp 97.1°F | Ht 66.5 in | Wt 152.8 lb

## 2013-07-26 DIAGNOSIS — M129 Arthropathy, unspecified: Secondary | ICD-10-CM

## 2013-07-26 DIAGNOSIS — M1990 Unspecified osteoarthritis, unspecified site: Secondary | ICD-10-CM

## 2013-07-26 MED ORDER — MELOXICAM 15 MG PO TABS: 15.0000 mg | ORAL_TABLET | Freq: Every day | ORAL | Status: DC

## 2013-07-26 MED ORDER — HYDROCODONE-ACETAMINOPHEN 5-325 MG PO TABS: 1.0000 | ORAL_TABLET | Freq: Four times a day (QID) | ORAL | Status: DC | PRN

## 2013-07-26 MED ORDER — KETOROLAC TROMETHAMINE 30 MG/ML IJ SOLN
30.0000 mg | Freq: Once | INTRAMUSCULAR | Status: AC
  Administered 2013-07-26: 30 mg via INTRAMUSCULAR

## 2013-07-26 MED ORDER — METHYLPREDNISOLONE (PAK) 4 MG PO TABS: ORAL_TABLET | ORAL | Status: DC

## 2013-07-26 NOTE — Progress Notes (Signed)
   Subjective:    Patient ID: Keith Olson., male    DOB: [birth year], 73 y.o.   MRN: [id number]  HPI This 73 y.o. male presents for evaluation of diffuse arthralgias for 2 days and has hx of OA and  Has taken medicine otc like BC powders but is not helping.   Review of Systems C/o arthralgias   No chest pain, SOB, HA, dizziness, vision change, N/V, diarrhea, constipation, dysuria, urinary urgency or frequency, myalgias or rash.  Objective:   Physical Exam  Vital signs noted  Well developed well nourished male.  HEENT - Head atraumatic Normocephalic                Eyes - PERRLA, Conjuctiva - clear Sclera- Clear EOMI                Ears - EAC's Wnl TM's Wnl Gross Hearing WNL                Nose - Nares patent                 Throat - oropharanx wnl Respiratory - Lungs CTA bilateral Cardiac - RRR S1 and S2 without murmur GI - Abdomen soft Nontender and bowel sounds active x 4 MS - TTP cervical, lumbar paraspinous muscles, TTP wrists, elbows, and knees bilateral.       Assessment & Plan:  Arthritis - Plan: methylPREDNIsolone (MEDROL DOSPACK) 4 MG tablet, HYDROcodone-acetaminophen (NORCO) 5-325 MG per tablet, meloxicam (MOBIC) 15 MG tablet, Arthritis Panel, ketorolac (TORADOL) 30 MG/ML injection 30 mg  Lysbeth Penner FNP

## 2013-09-24 ENCOUNTER — Telehealth: Payer: Self-pay | Admitting: Family Medicine

## 2013-09-24 ENCOUNTER — Ambulatory Visit (INDEPENDENT_AMBULATORY_CARE_PROVIDER_SITE_OTHER): Payer: Medicare Other | Admitting: Family Medicine

## 2013-09-24 ENCOUNTER — Ambulatory Visit (INDEPENDENT_AMBULATORY_CARE_PROVIDER_SITE_OTHER): Payer: Medicare Other

## 2013-09-24 ENCOUNTER — Encounter: Payer: Self-pay | Admitting: Family Medicine

## 2013-09-24 VITALS — BP 129/76 | HR 55 | Temp 96.7°F | Ht 66.5 in | Wt 153.0 lb

## 2013-09-24 DIAGNOSIS — F4323 Adjustment disorder with mixed anxiety and depressed mood: Secondary | ICD-10-CM

## 2013-09-24 DIAGNOSIS — M25532 Pain in left wrist: Secondary | ICD-10-CM

## 2013-09-24 DIAGNOSIS — M25519 Pain in unspecified shoulder: Secondary | ICD-10-CM

## 2013-09-24 DIAGNOSIS — M25512 Pain in left shoulder: Secondary | ICD-10-CM

## 2013-09-24 DIAGNOSIS — R0789 Other chest pain: Secondary | ICD-10-CM

## 2013-09-24 DIAGNOSIS — M25539 Pain in unspecified wrist: Secondary | ICD-10-CM

## 2013-09-24 DIAGNOSIS — E785 Hyperlipidemia, unspecified: Secondary | ICD-10-CM

## 2013-09-24 IMAGING — CR DG CHEST 2V
2 series · 2 of 2 positions shown · non-contrast
Comparison: None.

CLINICAL DATA: Chest tightness

EXAM:
CHEST  2 VIEW

[view not recorded (1 of 2)]
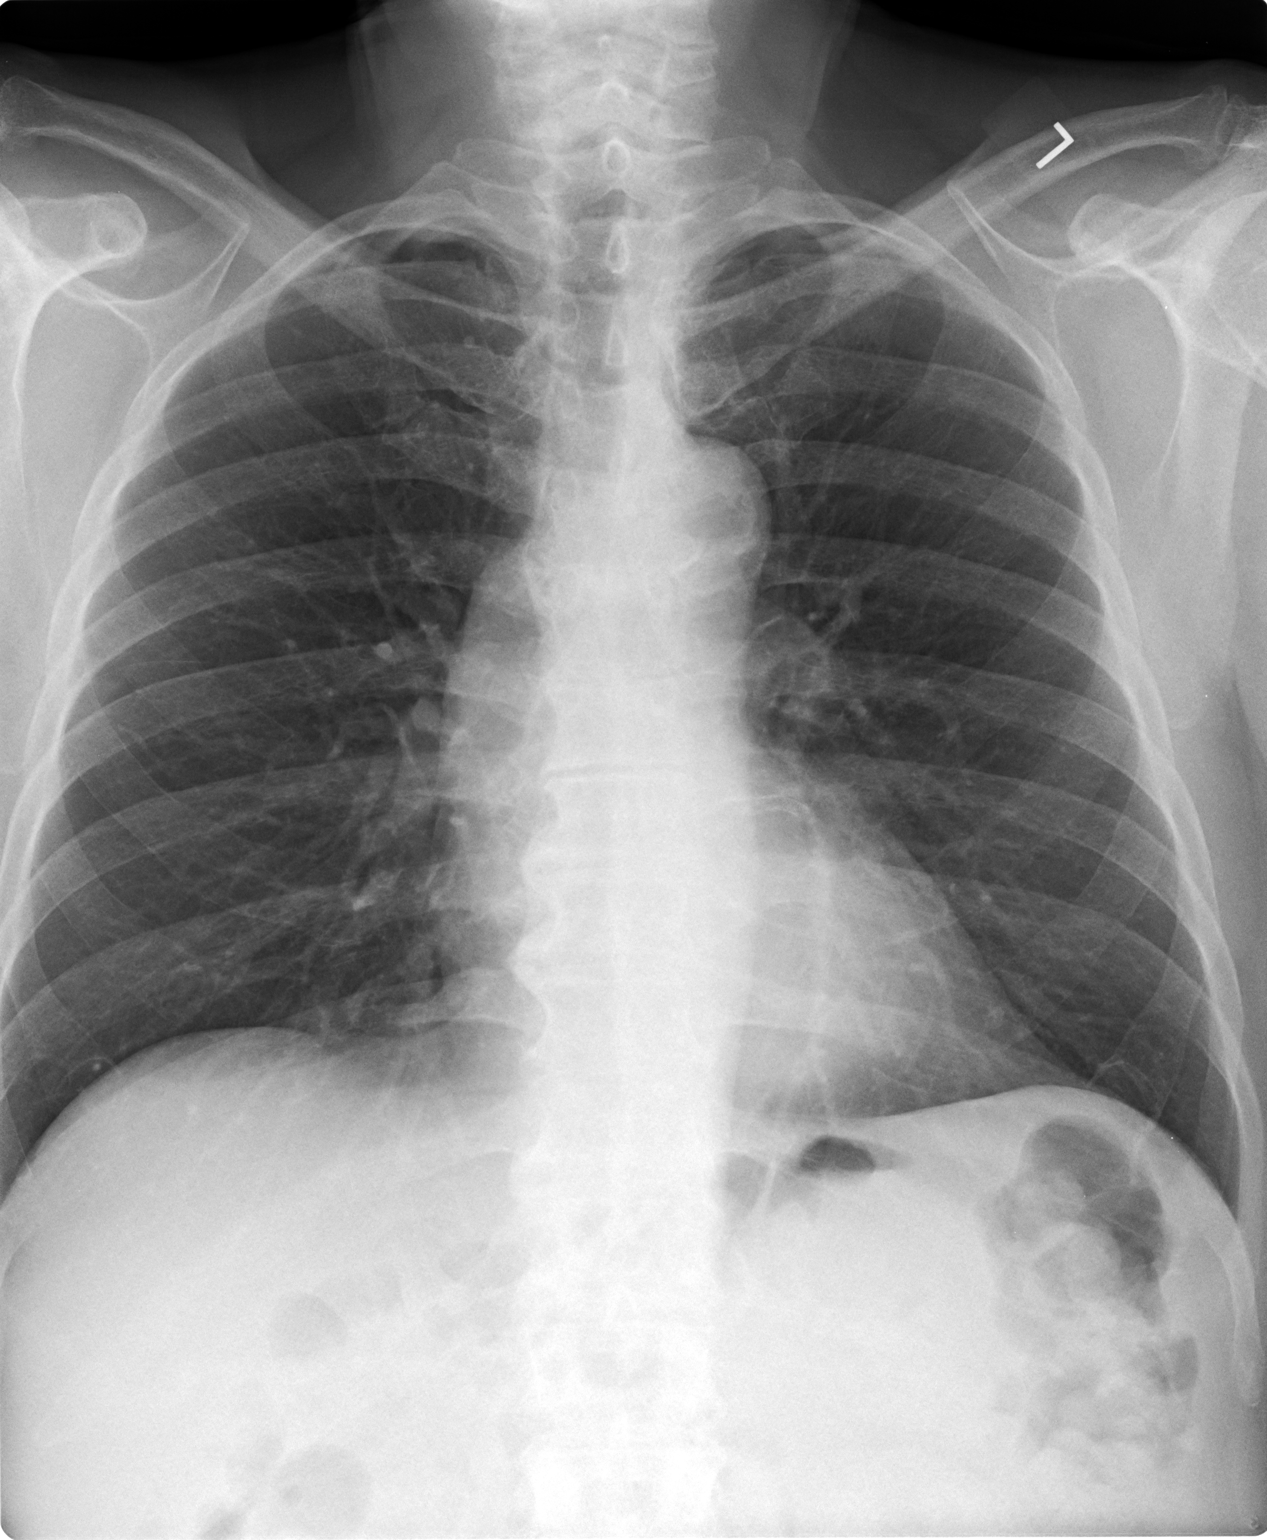

[view not recorded (2 of 2)]
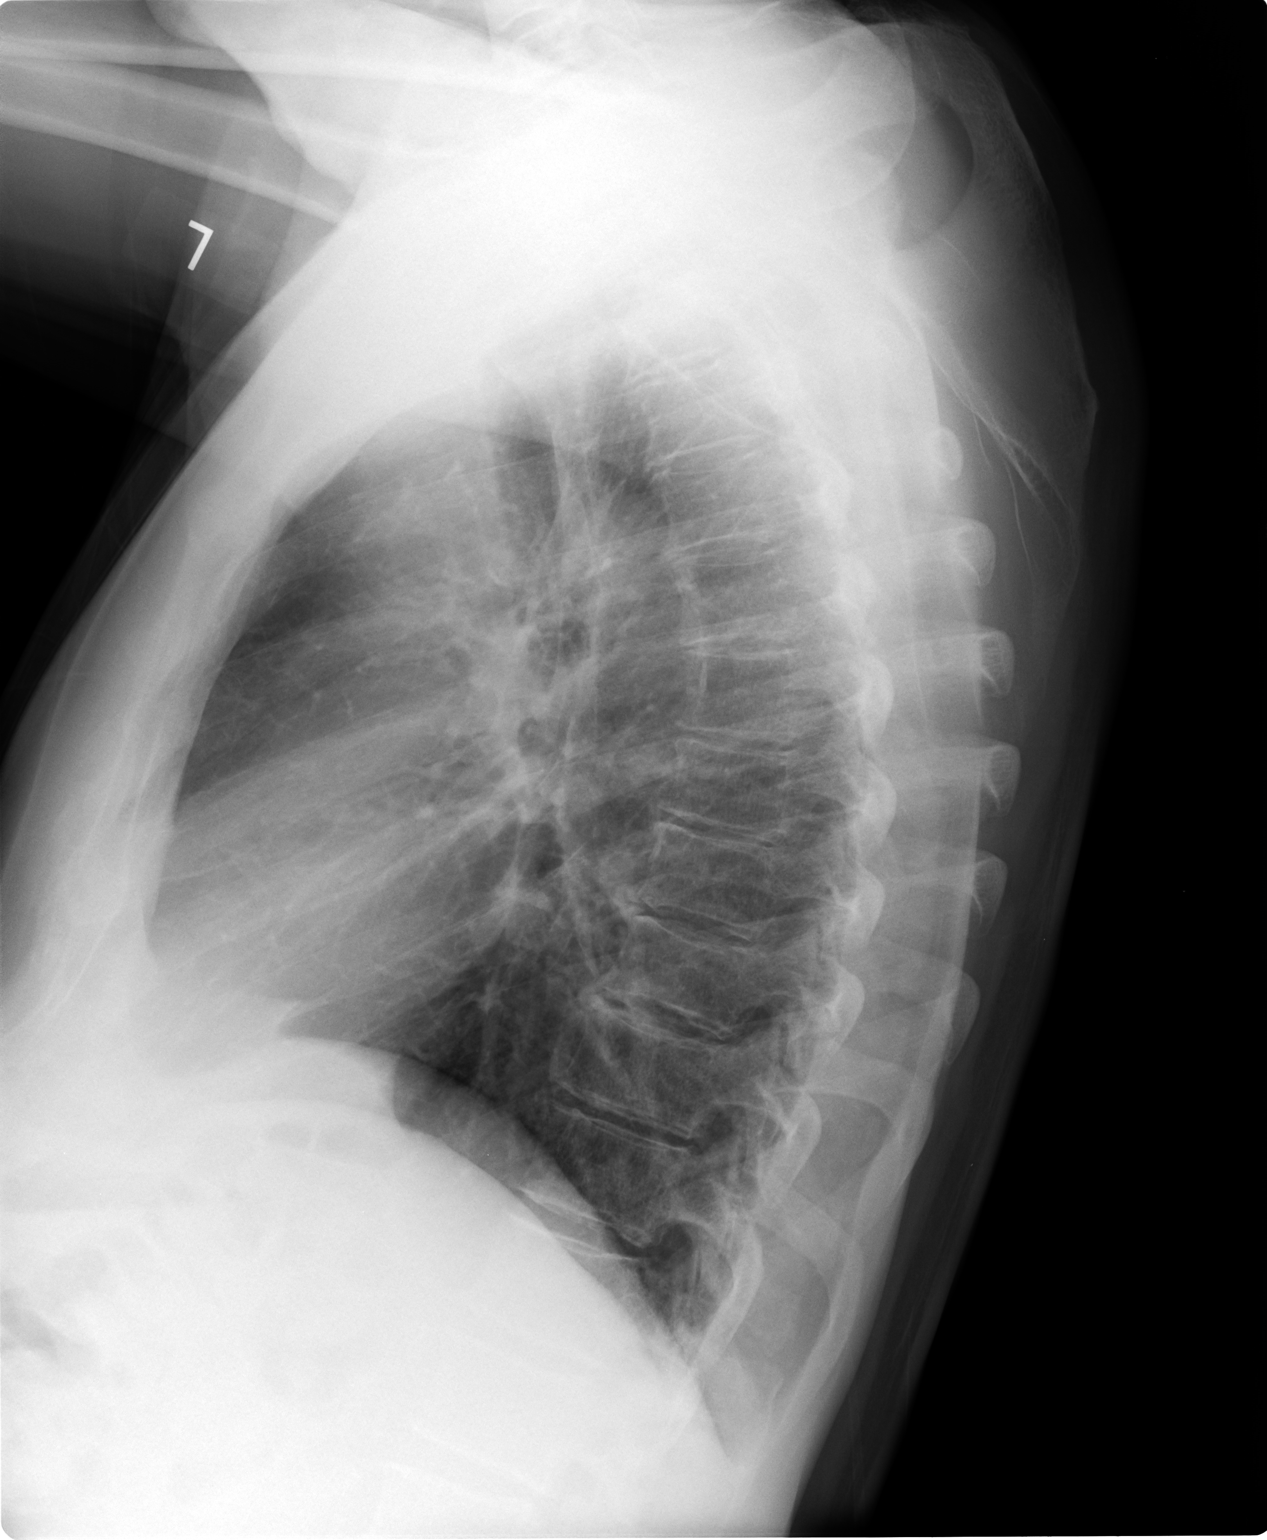

[2 of 2 positions shown; findings below may reference images not displayed]

FINDINGS: Midline trachea. Normal heart size. Atherosclerosis in the
transverse aorta. Right costophrenic angle minimally excluded. No
pleural effusion or pneumothorax. Clear lungs.
IMPRESSION: No acute cardiopulmonary disease.

## 2013-09-24 IMAGING — CR DG SHOULDER 2+V*L*
3 series · 3 of 3 positions shown · non-contrast
Comparison: None.

CLINICAL DATA: Left shoulder pain

EXAM:
LEFT SHOULDER - 2+ VIEW

[view not recorded (1 of 3)]
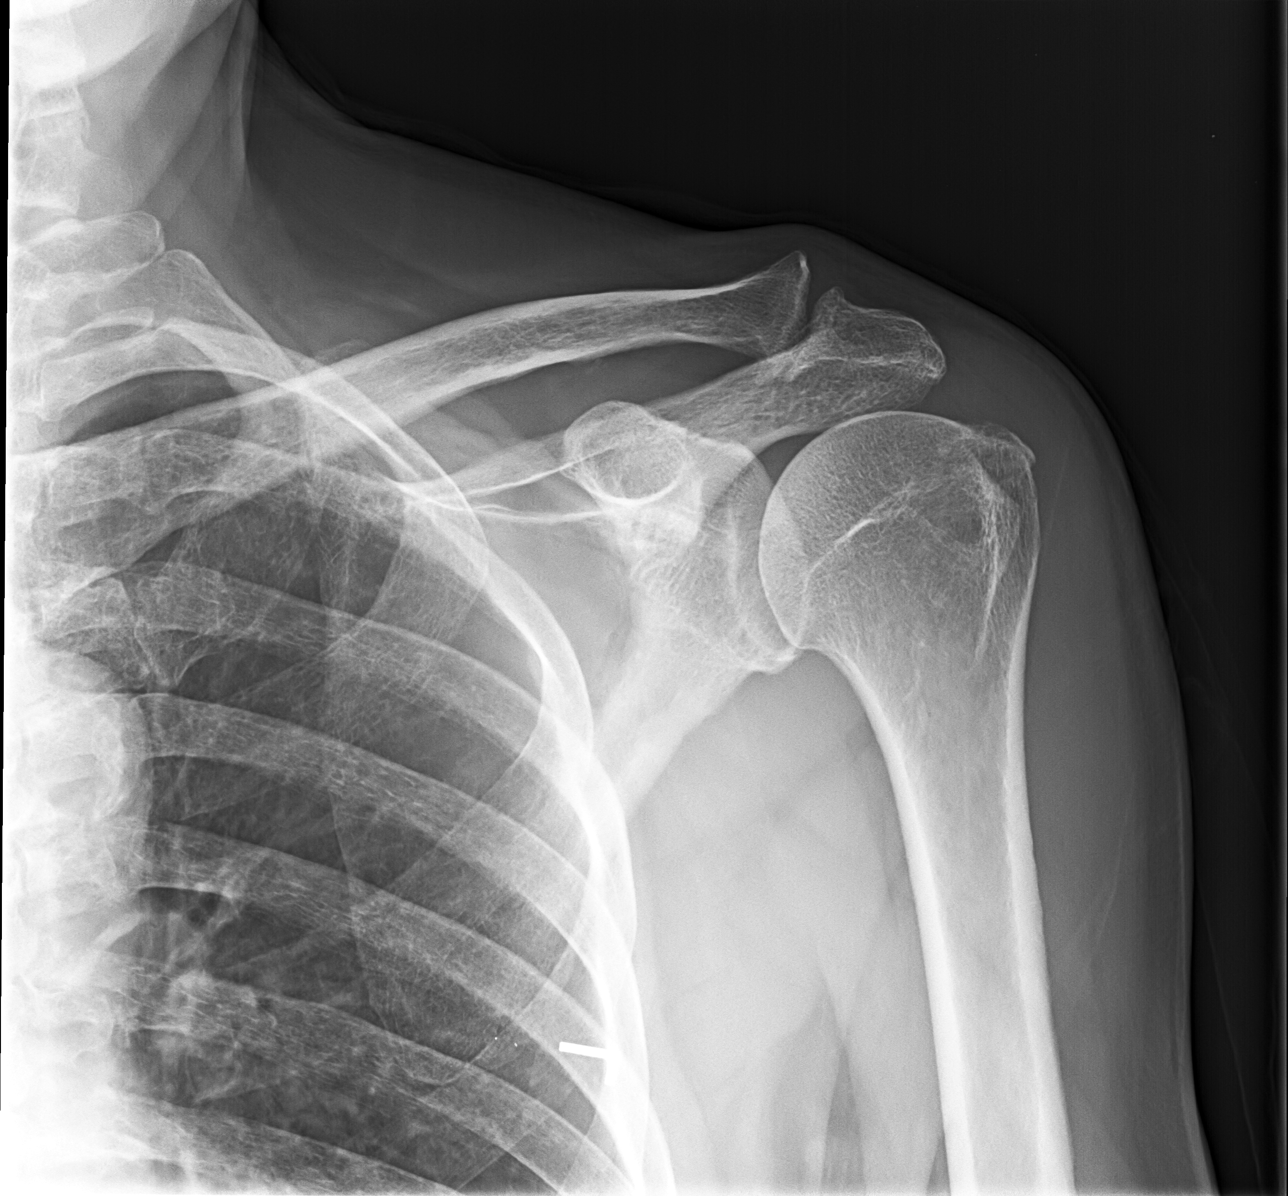

[view not recorded (2 of 3)]
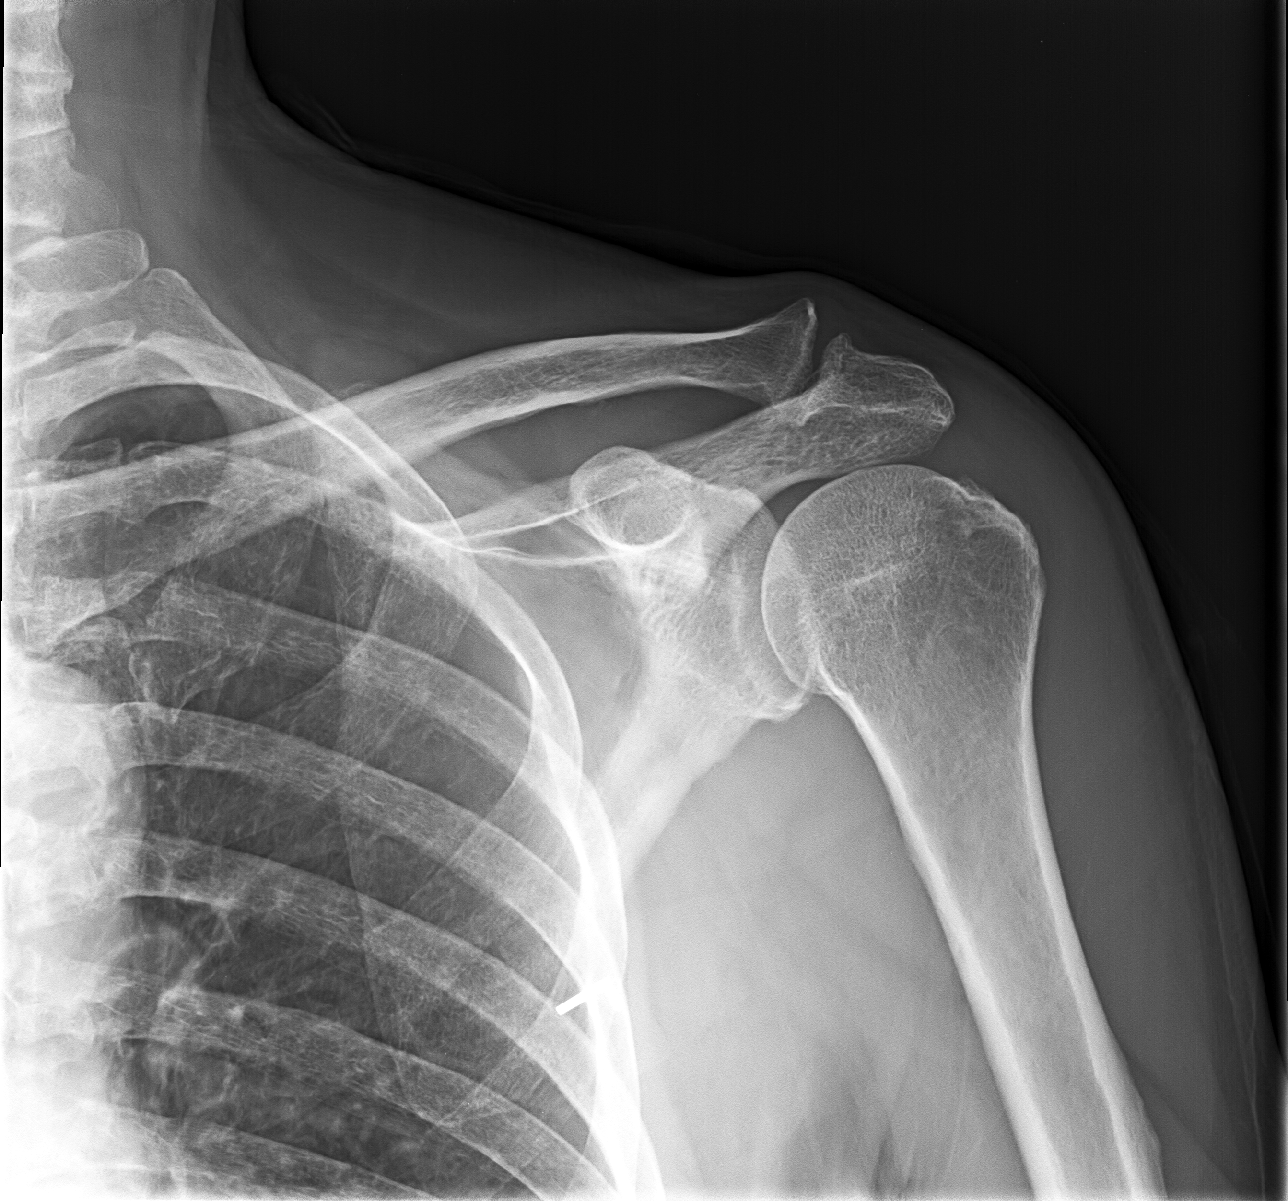

[view not recorded (3 of 3)]
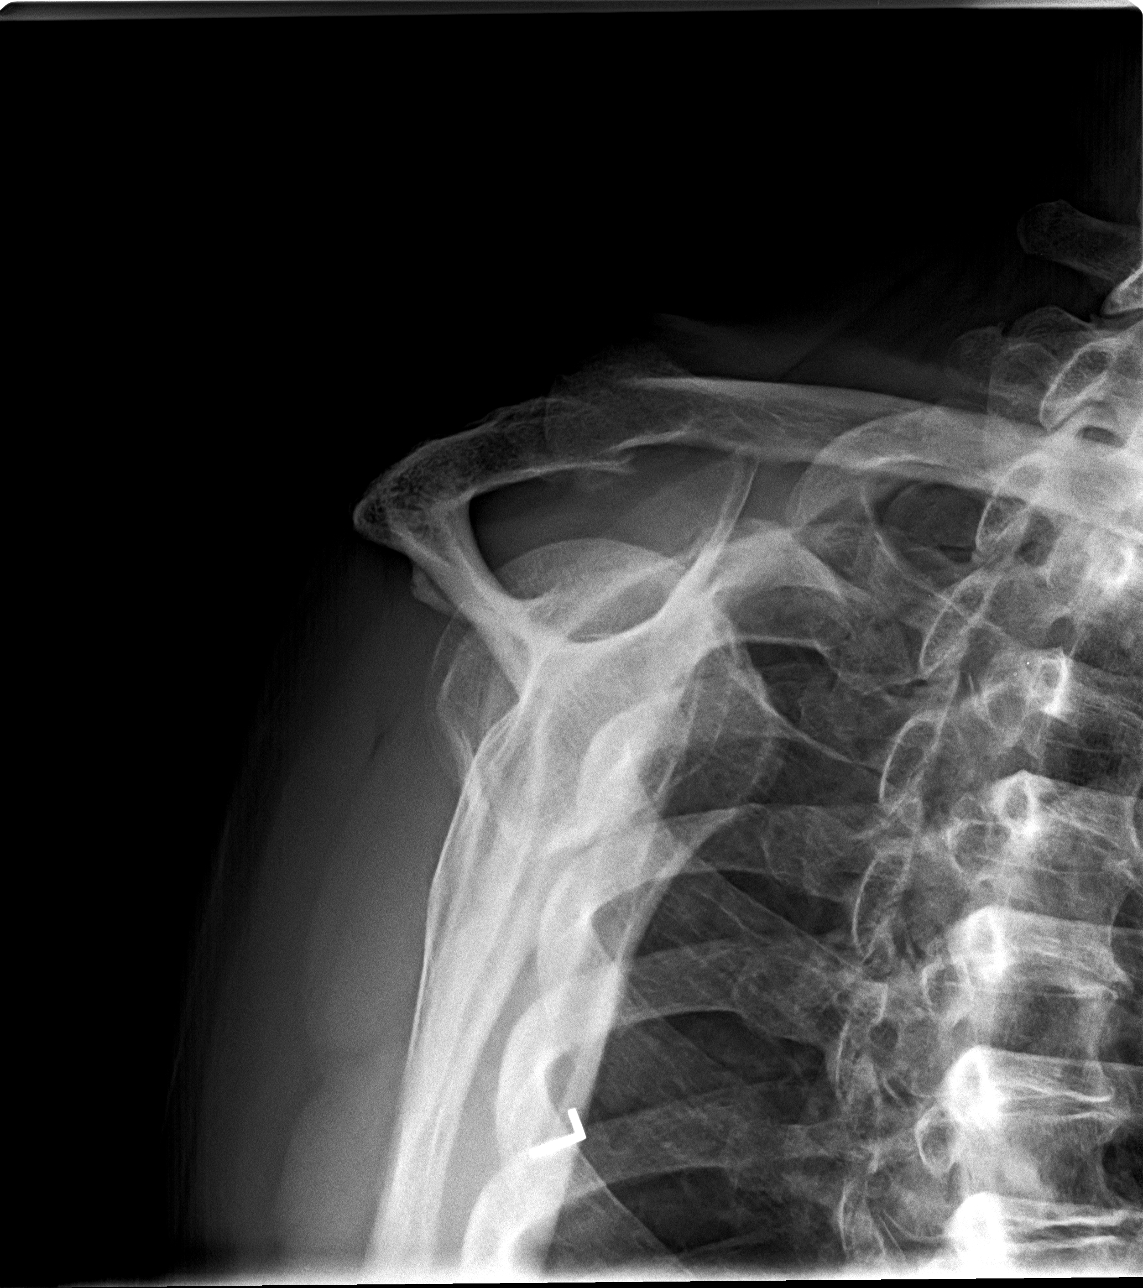

[3 of 3 positions shown; findings below may reference images not displayed]

FINDINGS: Moderately severe acromioclavicular arthritis. Mild degenerative
change involving the greater tuberosity.
IMPRESSION: No acute findings, degenerative changes.

## 2013-09-24 IMAGING — CR DG WRIST COMPLETE 3+V*L*
3 series · 3 of 3 positions shown · non-contrast
Comparison: [DATE]

CLINICAL DATA: Left wrist pain

EXAM:
LEFT WRIST - COMPLETE 3+ VIEW

[view not recorded (1 of 3)]
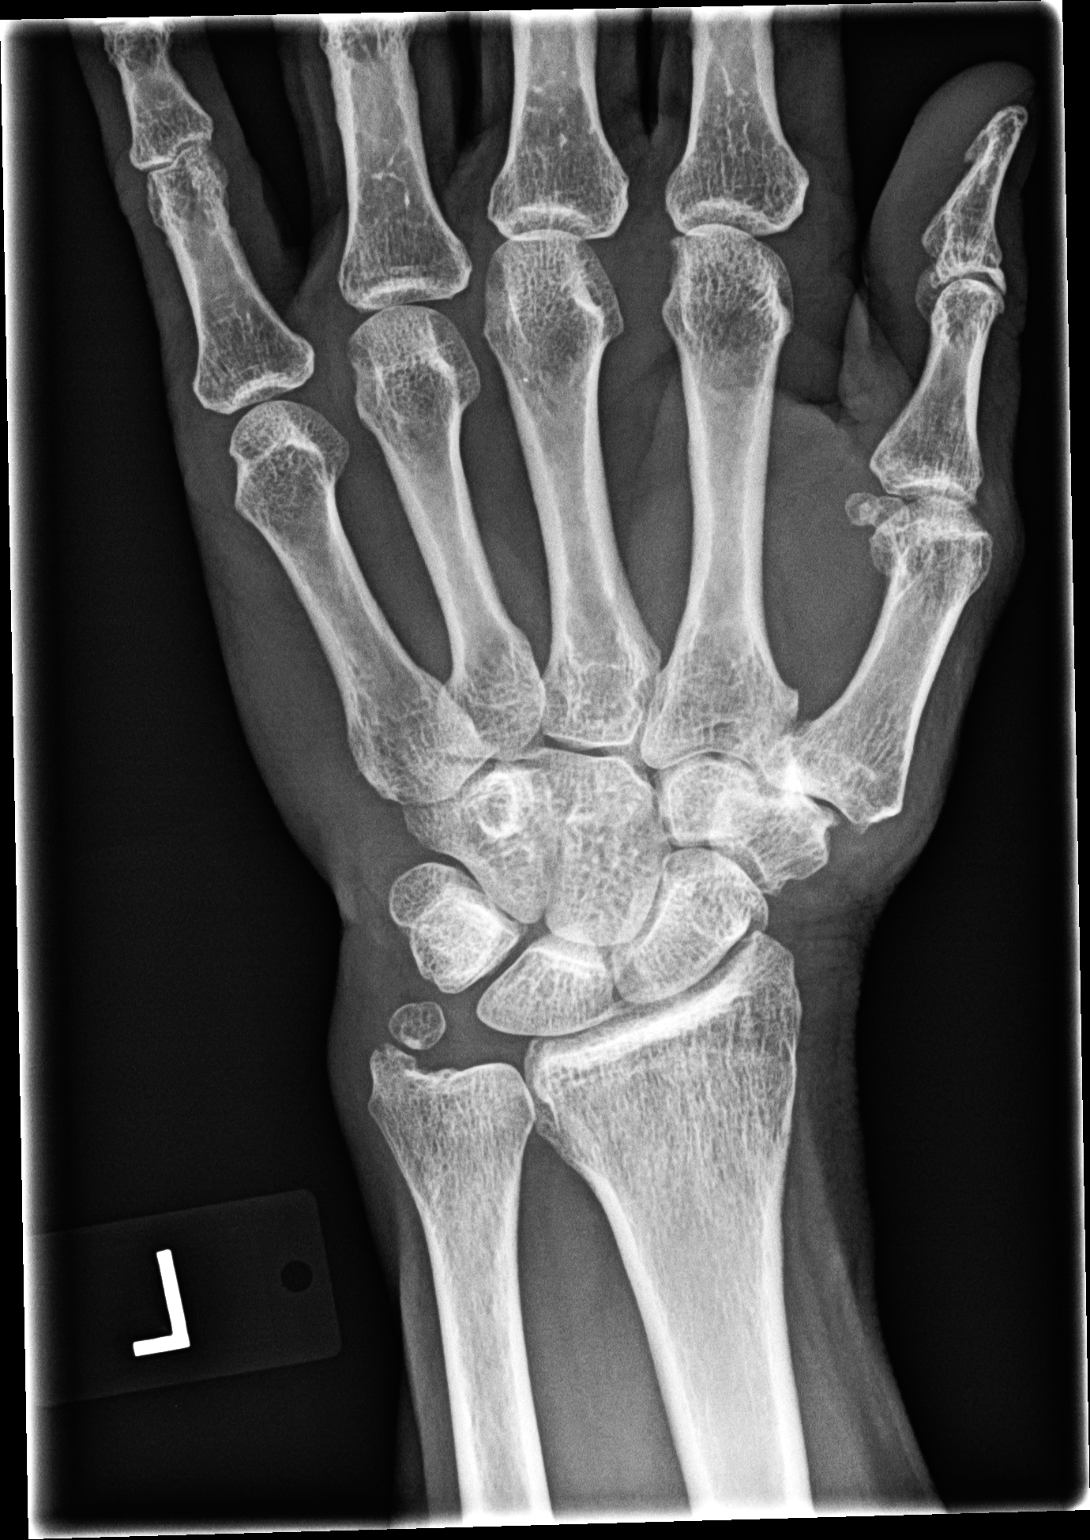

[view not recorded (2 of 3)]
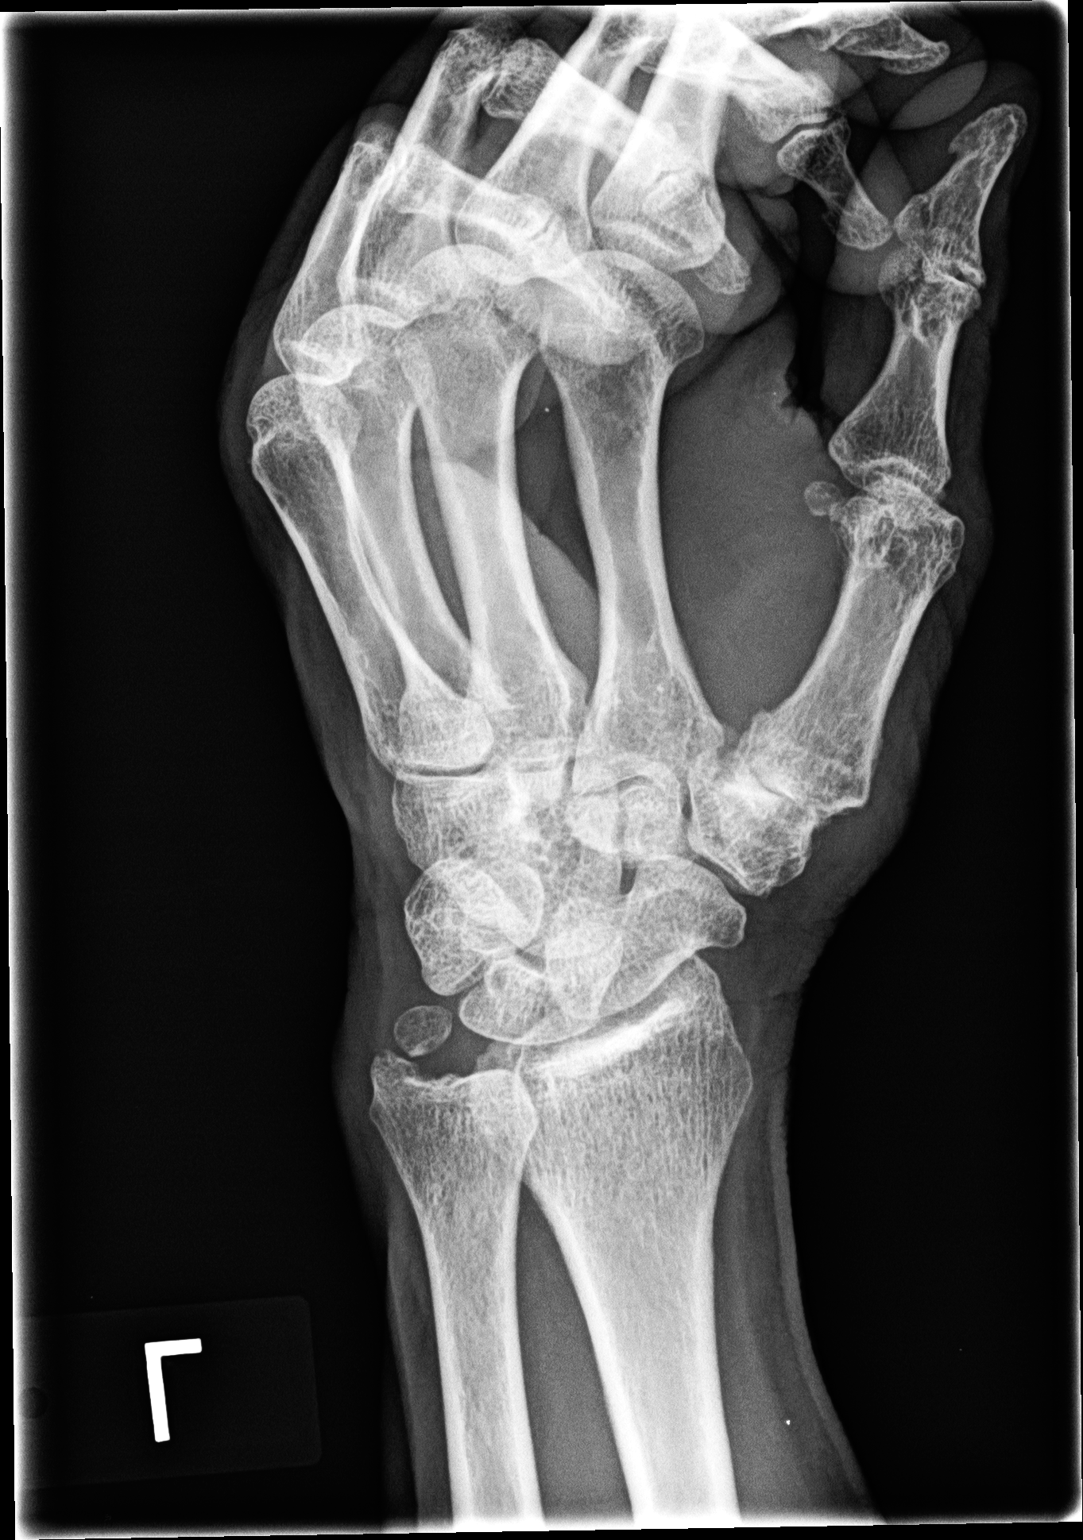

[view not recorded (3 of 3)]
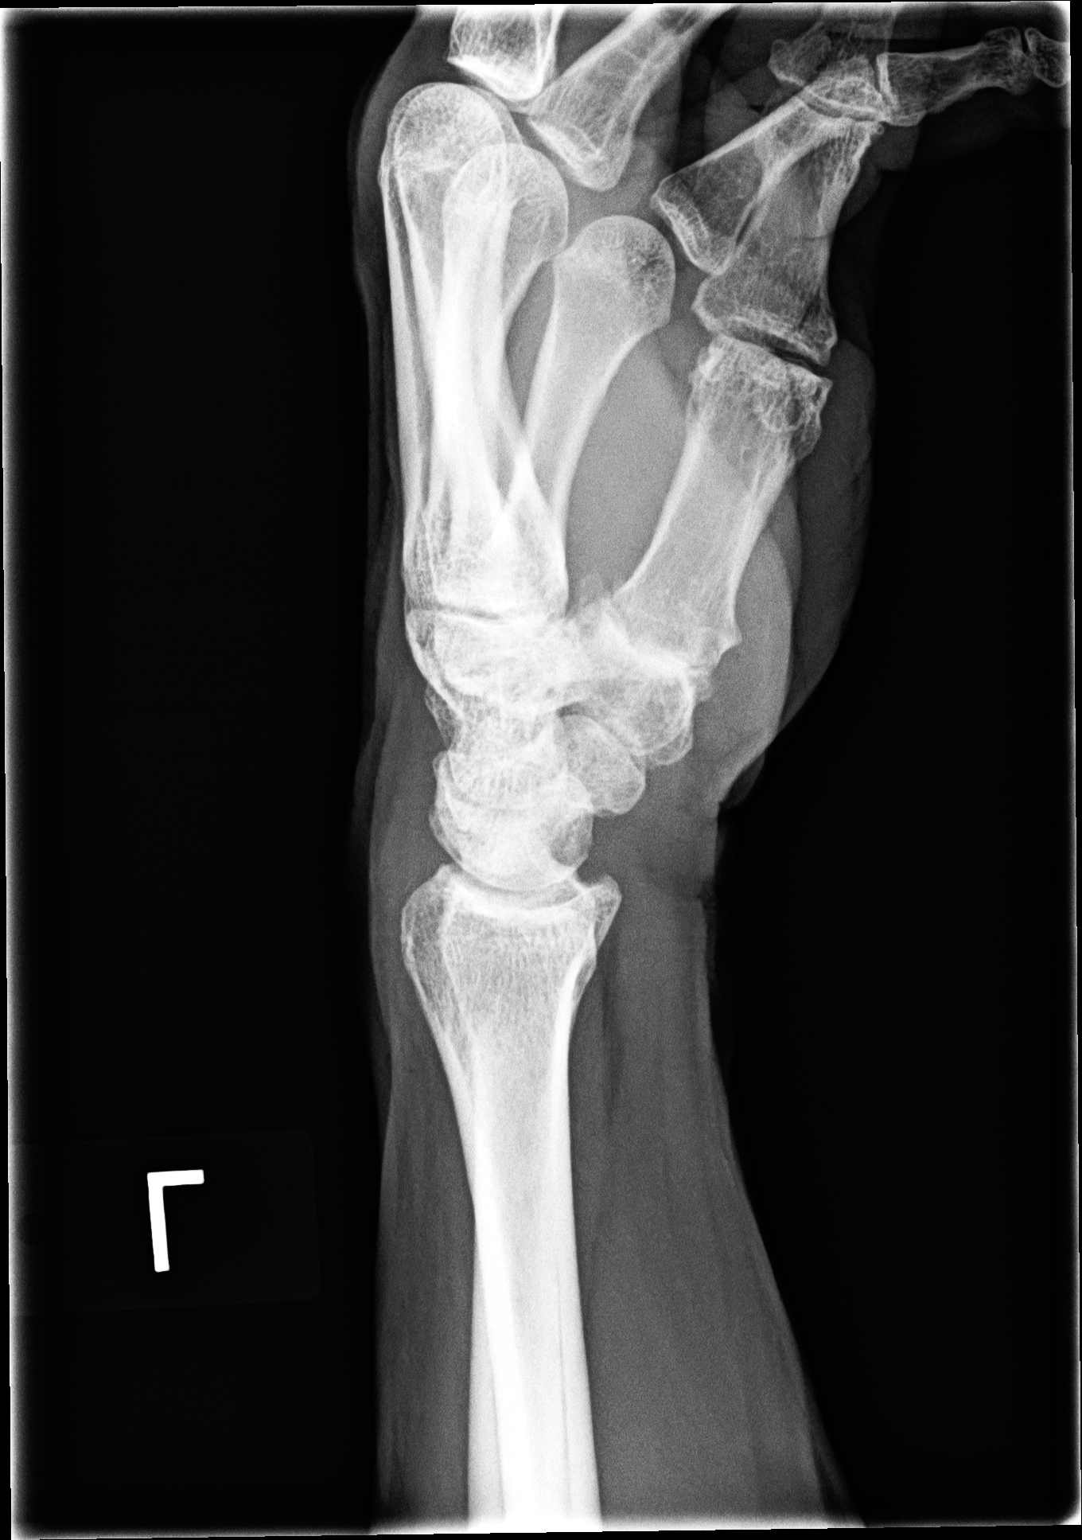

[3 of 3 positions shown; findings below may reference images not displayed]

FINDINGS: Old corticated ulnar styloid process fracture stable. Mild first
carpal metacarpal joint arthritis.
IMPRESSION: Stable findings from prior study.  No acute abnormalities.

## 2013-09-24 MED ORDER — ALPRAZOLAM 0.25 MG PO TABS: 0.2500 mg | ORAL_TABLET | Freq: Two times a day (BID) | ORAL | Status: DC | PRN

## 2013-09-24 MED ORDER — MELOXICAM 15 MG PO TABS: ORAL_TABLET | ORAL | Status: DC

## 2013-09-24 MED ORDER — ESCITALOPRAM OXALATE 10 MG PO TABS: 10.0000 mg | ORAL_TABLET | Freq: Every day | ORAL | Status: DC

## 2013-09-24 NOTE — Telephone Encounter (Signed)
Appt given for today per patients request 

## 2013-09-24 NOTE — Progress Notes (Signed)
Subjective:    Patient ID: Keith Toniann Fail., male    DOB: 28-Apr-1940, 73 y.o.   MRN: [id number]  HPI Patient here today for anxiety, shoulder pain, and a knot on left wrist. The patient comes to the visit today with his wife he is much younger looking than his stated age of 39. He indicates there is a lot of stress at home, and that they are raising 2 grandchildren. He was a English as a second language teacher and goes to the [place] for his medication and a lot of his treatments. He says the stress is very difficult for him to handle with the grandchildren. And sometimes this causes some chest discomfort. He indicates that about a year ago he saw a cardiologist and they did some type of evaluation and said that his circulation was fine. He has a previous history of a stent that was done in around 2002. He also complains of a knot or pain at his left wrist specifically the first MP joint. He also complains of pain at the left a.c. joint area. He has been taking a lot of ibuprofen and this has not helped his joint pain and He is taking thyroid medicine as his thyroid is underactive and this would just started one week ago. The medication he is getting is from the [place]. They also told him that he was vitamin D deficient.        Patient Active Problem List   Diagnosis Date Noted  . Other and unspecified hyperlipidemia 05/22/2012  . BPH (benign prostatic hyperplasia) 05/22/2012   Outpatient Encounter Prescriptions as of 09/24/2013  Medication Sig  . rosuvastatin (CRESTOR) 10 MG tablet Take 1 tablet (10 mg total) by mouth daily.  . tamsulosin (FLOMAX) 0.4 MG CAPS capsule TAKE TWO CAPSULES BY MOUTH ONCE DAILY  . [DISCONTINUED] HYDROcodone-acetaminophen (NORCO) 5-325 MG per tablet Take 1 tablet by mouth every 6 (six) hours as needed for moderate pain.  . [DISCONTINUED] meloxicam (MOBIC) 15 MG tablet Take 1 tablet (15 mg total) by mouth daily.  . [DISCONTINUED] methylPREDNIsolone (MEDROL DOSPACK) 4 MG tablet follow package directions   . [DISCONTINUED] oxymetazoline (AFRIN) 0.05 % nasal spray Place 2 sprays into the nose 2 (two) times daily.    Review of Systems  Constitutional: Negative.   HENT: Negative.   Eyes: Negative.   Respiratory: Negative.   Cardiovascular: Negative.   Gastrointestinal: Negative.   Endocrine: Negative.   Genitourinary: Negative.   Musculoskeletal: Positive for arthralgias (mostly left shoulder).  Skin: Negative.        Knot on left wrist  Allergic/Immunologic: Negative.   Neurological: Negative.   Hematological: Negative.   Psychiatric/Behavioral: The patient is nervous/anxious.        Objective:   Physical Exam  Nursing note and vitals reviewed. Constitutional: He is oriented to person, place, and time. He appears well-developed and well-nourished. He appears distressed.  HENT:  Head: Normocephalic and atraumatic.  Right Ear: External ear normal.  Left Ear: External ear normal.  Nose: Nose normal.  Mouth/Throat: Oropharynx is clear and moist. No oropharyngeal exudate.  Eyes: Conjunctivae and EOM are normal. Pupils are equal, round, and reactive to light. Right eye exhibits no discharge. Left eye exhibits no discharge. No scleral icterus.  Neck: Normal range of motion. Neck supple. No thyromegaly present.  No carotid bruits  Cardiovascular: Normal rate, regular rhythm, normal heart sounds and intact distal pulses.  Exam reveals no gallop and no friction rub.   No murmur heard. At 72 per minute  Pulmonary/Chest: Effort normal and breath sounds normal. No respiratory distress. He has no wheezes. He has no rales. He exhibits no tenderness.  Abdominal: Soft. Bowel sounds are normal. He exhibits no mass. There is tenderness. There is no rebound and no guarding.  Slight upper abdominal tenderness  Musculoskeletal: Normal range of motion. He exhibits no edema and no tenderness.  Tender left a.c. joint and slight prominence of the first radial metacarpal joint with tenderness    Lymphadenopathy:    He has no cervical adenopathy.  Neurological: He is alert and oriented to person, place, and time. No cranial nerve deficit.  Skin: Skin is warm and dry. No rash noted. No erythema. No pallor.  Psychiatric: His behavior is normal. Judgment and thought content normal.  Anxious mood   BP 129/76  Pulse 55  Temp(Src) 96.7 F (35.9 C) (Oral)  Ht 5' 6.5" (1.689 m)  Wt 153 lb (69.4 kg)  BMI 24.33 kg/m2  [facility] reading (PRIMARY) by  Dr.Moore-chest x-ray and left wrist --- chest x-ray no active disease, left a.c. joint degenerative changes, left first MP joint degenerative change                                       Assessment & Plan:  1. Left shoulder pain - DG Shoulder Left; Future - meloxicam (MOBIC) 15 MG tablet; 1/2-1 daily after eating  Dispense: 30 tablet; Refill: 0  2. Left wrist pain - DG Wrist Complete Left; Future - meloxicam (MOBIC) 15 MG tablet; 1/2-1 daily after eating  Dispense: 30 tablet; Refill: 0  3. Chest tightness - DG Chest 2 View; Future - EKG 12-Lead  4. Adjustment disorder with mixed anxiety and depressed mood - ALPRAZolam (XANAX) 0.25 MG tablet; Take 1 tablet (0.25 mg total) by mouth 2 (two) times daily as needed for anxiety.  Dispense: 20 tablet; Refill: 0 - escitalopram (LEXAPRO) 10 MG tablet; Take 1 tablet (10 mg total) by mouth daily.  Dispense: 30 tablet; Refill: 2  Patient Instructions  Please bring results of lab work from the [place] Please also get a copy of the report where you were referred to a cardiologist and they evaluated your heart about a year ago Take medication as directed Since I will be giving you an anti-inflammatory medicine do not take over-the-counter ibuprofen, only take extra strength Tylenol if additional medication is needed for pain. Make sure that you take the anti-inflammatory medicine that I gave you after eating, and if it bothers her stomach discontinue it. Avoid caffeine as much as possible   Arrie Senate MD

## 2013-09-24 NOTE — Patient Instructions (Signed)
Please bring results of lab work from the [place] Please also get a copy of the report where you were referred to a cardiologist and they evaluated your heart about a year ago Take medication as directed Since I will be giving you an anti-inflammatory medicine do not take over-the-counter ibuprofen, only take extra strength Tylenol if additional medication is needed for pain. Make sure that you take the anti-inflammatory medicine that I gave you after eating, and if it bothers her stomach discontinue it. Avoid caffeine as much as possible

## 2013-10-08 ENCOUNTER — Ambulatory Visit: Payer: Medicare Other | Admitting: Family Medicine

## 2013-11-12 ENCOUNTER — Telehealth: Payer: Self-pay | Admitting: Family Medicine

## 2013-11-12 DIAGNOSIS — M25559 Pain in unspecified hip: Secondary | ICD-10-CM

## 2013-11-12 DIAGNOSIS — M25519 Pain in unspecified shoulder: Secondary | ICD-10-CM

## 2013-11-13 MED ORDER — TRAMADOL HCL 50 MG PO TABS: ORAL_TABLET | ORAL | Status: DC

## 2013-11-13 NOTE — Telephone Encounter (Signed)
Please get an appointment with [facility] Also he can have a prescription for tramadol 50 one half tablet twice daily if needed for severe pain #20, no refill

## 2013-11-13 NOTE — Telephone Encounter (Signed)
Referral placed and med written - pt aware of both

## 2013-12-05 ENCOUNTER — Encounter: Payer: Self-pay | Admitting: *Deleted

## 2013-12-10 ENCOUNTER — Telehealth: Payer: Self-pay | Admitting: Family Medicine

## 2013-12-12 ENCOUNTER — Ambulatory Visit: Payer: No Typology Code available for payment source | Admitting: Cardiology

## 2013-12-17 ENCOUNTER — Telehealth: Payer: Self-pay

## 2013-12-17 DIAGNOSIS — M25551 Pain in right hip: Secondary | ICD-10-CM

## 2013-12-17 DIAGNOSIS — M25532 Pain in left wrist: Secondary | ICD-10-CM

## 2013-12-17 DIAGNOSIS — M25512 Pain in left shoulder: Secondary | ICD-10-CM

## 2013-12-17 DIAGNOSIS — M25552 Pain in left hip: Secondary | ICD-10-CM

## 2013-12-17 NOTE — Telephone Encounter (Signed)
Please have patient be more specific about his arthritis and we can determine if he needs to go see the rheumatologist or the orthopedic surgeon

## 2013-12-17 NOTE — Telephone Encounter (Signed)
Wants referral for Arthritis

## 2013-12-18 NOTE — Telephone Encounter (Signed)
Pain is wrist, shoulders, and hips xrays have shown arthritis and degenerative changes We have not xray'd hips  Referral to [facility] to be set up

## 2014-02-05 ENCOUNTER — Encounter: Payer: Self-pay | Admitting: Family Medicine

## 2014-02-05 ENCOUNTER — Ambulatory Visit (INDEPENDENT_AMBULATORY_CARE_PROVIDER_SITE_OTHER): Payer: Medicare Other | Admitting: Family Medicine

## 2014-02-05 VITALS — BP 126/69 | HR 62 | Temp 96.8°F | Ht 66.5 in | Wt 158.0 lb

## 2014-02-05 DIAGNOSIS — F4323 Adjustment disorder with mixed anxiety and depressed mood: Secondary | ICD-10-CM

## 2014-02-05 DIAGNOSIS — J0100 Acute maxillary sinusitis, unspecified: Secondary | ICD-10-CM

## 2014-02-05 MED ORDER — ALPRAZOLAM 1 MG PO TABS: 1.0000 mg | ORAL_TABLET | Freq: Two times a day (BID) | ORAL | Status: DC | PRN

## 2014-02-05 MED ORDER — METHYLPREDNISOLONE ACETATE 80 MG/ML IJ SUSP
80.0000 mg | Freq: Once | INTRAMUSCULAR | Status: AC
  Administered 2014-02-05: 80 mg via INTRAMUSCULAR

## 2014-02-05 MED ORDER — AMOXICILLIN 875 MG PO TABS: 875.0000 mg | ORAL_TABLET | Freq: Two times a day (BID) | ORAL | Status: DC

## 2014-02-05 NOTE — Progress Notes (Signed)
   Subjective:    Patient ID: Keith Olson., male    DOB: 1940/05/21, 73 y.o.   MRN: [id number]  HPI  C/o uri and arthritis.  He is having worsening anxiety and tremors since having to take care of both of his grandchildren.  Review of Systems  Constitutional: Negative for fever.  HENT: Negative for ear pain.   Eyes: Negative for discharge.  Respiratory: Negative for cough.   Cardiovascular: Negative for chest pain.  Gastrointestinal: Negative for abdominal distention.  Endocrine: Negative for polyuria.  Genitourinary: Negative for difficulty urinating.  Musculoskeletal: Negative for gait problem and neck pain.  Skin: Negative for color change and rash.  Neurological: Negative for speech difficulty and headaches.  Psychiatric/Behavioral: Negative for agitation.       Objective:    BP 126/69 mmHg  Pulse 62  Temp(Src) 96.8 F (36 C) (Oral)  Ht 5' 6.5" (1.689 m)  Wt 158 lb (71.668 kg)  BMI 25.12 kg/m2 Physical Exam  Constitutional: He is oriented to person, place, and time. He appears well-developed and well-nourished.  HENT:  Head: Normocephalic and atraumatic.  Mouth/Throat: Oropharynx is clear and moist.  Eyes: Pupils are equal, round, and reactive to light.  Neck: Normal range of motion. Neck supple.  Cardiovascular: Normal rate and regular rhythm.   No murmur heard. Pulmonary/Chest: Effort normal and breath sounds normal.  Abdominal: Soft. Bowel sounds are normal. There is no tenderness.  Neurological: He is alert and oriented to person, place, and time.  Skin: Skin is warm and dry.  Psychiatric: He has a normal mood and affect.          Assessment & Plan:     ICD-9-CM ICD-10-CM   1. Adjustment disorder with mixed anxiety and depressed mood 309.28 F43.23 ALPRAZolam (XANAX) 1 MG tablet  2. Subacute maxillary sinusitis 461.0 J01.00 amoxicillin (AMOXIL) 875 MG tablet     methylPREDNISolone acetate (DEPO-MEDROL) injection 80 mg     No Follow-up on  file.  Lysbeth Penner FNP

## 2014-02-06 ENCOUNTER — Telehealth: Payer: Self-pay | Admitting: Family Medicine

## 2014-03-07 ENCOUNTER — Encounter: Payer: Self-pay | Admitting: Family Medicine

## 2014-03-07 ENCOUNTER — Ambulatory Visit (INDEPENDENT_AMBULATORY_CARE_PROVIDER_SITE_OTHER): Payer: Medicare Other | Admitting: Family Medicine

## 2014-03-07 VITALS — BP 130/68 | HR 66 | Temp 96.5°F | Ht 66.5 in | Wt 156.6 lb

## 2014-03-07 DIAGNOSIS — M545 Low back pain: Secondary | ICD-10-CM | POA: Diagnosis not present

## 2014-03-07 LAB — POCT URINALYSIS DIPSTICK
Bilirubin, UA: NEGATIVE
Blood, UA: NEGATIVE
Glucose, UA: NEGATIVE
Ketones, UA: NEGATIVE
Leukocytes, UA: NEGATIVE
Nitrite, UA: NEGATIVE
Protein, UA: NEGATIVE
Spec Grav, UA: 1.01
Urobilinogen, UA: NEGATIVE
pH, UA: 5

## 2014-03-07 LAB — POCT UA - MICROSCOPIC ONLY
Bacteria, U Microscopic: NEGATIVE
Casts, Ur, LPF, POC: NEGATIVE
Crystals, Ur, HPF, POC: NEGATIVE
Mucus, UA: NEGATIVE
RBC, urine, microscopic: NEGATIVE
WBC, Ur, HPF, POC: NEGATIVE
Yeast, UA: NEGATIVE

## 2014-03-07 MED ORDER — METHYLPREDNISOLONE (PAK) 4 MG PO TABS: ORAL_TABLET | ORAL | Status: DC

## 2014-03-07 MED ORDER — HYDROCODONE-ACETAMINOPHEN 7.5-325 MG PO TABS: 1.0000 | ORAL_TABLET | Freq: Four times a day (QID) | ORAL | Status: DC | PRN

## 2014-03-07 NOTE — Progress Notes (Signed)
   Subjective:    Patient ID: Keith Toniann Fail., male    DOB: Jul 01, 1940, 74 y.o.   MRN: [id number]  HPI Patient here for back pain which is radiating down his right leg and the pain is severe.  Review of Systems  Constitutional: Negative for fever.  HENT: Negative for ear pain.   Eyes: Negative for discharge.  Respiratory: Negative for cough.   Cardiovascular: Negative for chest pain.  Gastrointestinal: Negative for abdominal distention.  Endocrine: Negative for polyuria.  Genitourinary: Negative for difficulty urinating.  Musculoskeletal: Positive for myalgias and arthralgias. Negative for gait problem and neck pain.  Skin: Negative for color change and rash.  Neurological: Negative for speech difficulty and headaches.  Psychiatric/Behavioral: Negative for agitation.       Objective:    BP 130/68 mmHg  Pulse 66  Temp(Src) 96.5 F (35.8 C) (Oral)  Ht 5' 6.5" (1.689 m)  Wt 156 lb 9.6 oz (71.033 kg)  BMI 24.90 kg/m2 Physical Exam  Constitutional: He is oriented to person, place, and time. He appears well-developed and well-nourished.  HENT:  Head: Normocephalic and atraumatic.  Mouth/Throat: Oropharynx is clear and moist.  Eyes: Pupils are equal, round, and reactive to light.  Neck: Normal range of motion. Neck supple.  Cardiovascular: Normal rate and regular rhythm.   No murmur heard. Pulmonary/Chest: Effort normal and breath sounds normal.  Abdominal: Soft. Bowel sounds are normal. There is no tenderness.  Musculoskeletal: He exhibits tenderness.  TTP right LS muscle  Neurological: He is alert and oriented to person, place, and time.  Skin: Skin is warm and dry.  Psychiatric: He has a normal mood and affect.    Results for orders placed or performed in visit on 12/12/12  POCT urinalysis dipstick  Result Value Ref Range   Color, UA yellow    Clarity, UA clear    Glucose, UA neg    Bilirubin, UA neg    Ketones, UA neg    Spec Grav, UA 1.010    Blood, UA neg    pH, UA 7.0    Protein, UA neg    Urobilinogen, UA negative    Nitrite, UA neg    Leukocytes, UA Negative   POCT UA - Microscopic Only  Result Value Ref Range   WBC, Ur, HPF, POC neg    RBC, urine, microscopic neg    Bacteria, U Microscopic neg    Mucus, UA neg    Epithelial cells, urine per micros occ    Crystals, Ur, HPF, POC neg    Casts, Ur, LPF, POC neg    Yeast, UA neg         Assessment & Plan:     ICD-9-CM ICD-10-CM   1. Right low back pain, with sciatica presence unspecified 724.2 M54.5 POCT UA - Microscopic Only     POCT urinalysis dipstick     No Follow-up on file.  Lysbeth Penner FNP

## 2014-03-11 ENCOUNTER — Telehealth: Payer: Self-pay | Admitting: Family Medicine

## 2014-03-11 ENCOUNTER — Other Ambulatory Visit: Payer: Self-pay | Admitting: Family Medicine

## 2014-03-11 MED ORDER — ROSUVASTATIN CALCIUM 10 MG PO TABS: 10.0000 mg | ORAL_TABLET | Freq: Every day | ORAL | Status: DC

## 2014-03-11 NOTE — Telephone Encounter (Signed)
Can we give him an rx for Crestor, last time rx'd was 10/2012 #30 no refills.

## 2014-04-12 ENCOUNTER — Other Ambulatory Visit: Payer: Self-pay | Admitting: Family Medicine

## 2014-04-12 ENCOUNTER — Telehealth: Payer: Self-pay | Admitting: Family Medicine

## 2014-04-12 DIAGNOSIS — F4323 Adjustment disorder with mixed anxiety and depressed mood: Secondary | ICD-10-CM

## 2014-04-12 MED ORDER — ALPRAZOLAM 1 MG PO TABS: 1.0000 mg | ORAL_TABLET | Freq: Two times a day (BID) | ORAL | Status: DC | PRN

## 2014-04-13 NOTE — Telephone Encounter (Signed)
Left message on pt's voicemail that refill was called to [facility].

## 2014-04-15 ENCOUNTER — Other Ambulatory Visit: Payer: Self-pay | Admitting: Family Medicine

## 2014-05-30 ENCOUNTER — Other Ambulatory Visit: Payer: Self-pay | Admitting: Family Medicine

## 2014-05-30 DIAGNOSIS — F4323 Adjustment disorder with mixed anxiety and depressed mood: Secondary | ICD-10-CM

## 2014-05-30 MED ORDER — ALPRAZOLAM 1 MG PO TABS
1.0000 mg | ORAL_TABLET | Freq: Two times a day (BID) | ORAL | Status: DC | PRN
Start: 2014-05-30 — End: 2014-09-30

## 2014-05-30 NOTE — Telephone Encounter (Signed)
Requesting refill on Xanax. He would like the quantity increased to #60. Directions are written for 1 BID PRN and he takes 1 BID.  Patient was seeing Dietrich Pates, [place].  Please review and change quantity if needed. Route to nurse to call in if approved.

## 2014-05-30 NOTE — Telephone Encounter (Signed)
Called into pharmacy. Patient aware.

## 2014-06-10 ENCOUNTER — Ambulatory Visit (INDEPENDENT_AMBULATORY_CARE_PROVIDER_SITE_OTHER): Payer: Medicare Other | Admitting: Physician Assistant

## 2014-06-10 ENCOUNTER — Encounter: Payer: Self-pay | Admitting: Physician Assistant

## 2014-06-10 VITALS — BP 125/69 | HR 56 | Temp 96.6°F | Ht 66.5 in | Wt 155.0 lb

## 2014-06-10 DIAGNOSIS — G8929 Other chronic pain: Secondary | ICD-10-CM

## 2014-06-10 DIAGNOSIS — M25559 Pain in unspecified hip: Secondary | ICD-10-CM | POA: Diagnosis not present

## 2014-06-10 DIAGNOSIS — J0100 Acute maxillary sinusitis, unspecified: Secondary | ICD-10-CM

## 2014-06-10 MED ORDER — AMOXICILLIN-POT CLAVULANATE 875-125 MG PO TABS: 1.0000 | ORAL_TABLET | Freq: Two times a day (BID) | ORAL | Status: DC

## 2014-06-10 MED ORDER — FLUTICASONE PROPIONATE 50 MCG/ACT NA SUSP: 2.0000 | Freq: Every day | NASAL | Status: DC

## 2014-06-10 MED ORDER — TRAMADOL HCL 50 MG PO TABS: ORAL_TABLET | ORAL | Status: DC

## 2014-06-10 NOTE — Patient Instructions (Signed)
TAKE PLAIN MUSINEX AS DIRECTED OVER THE COUNTER USE NETTI POT OVER THE COUNTER OR SINUS RINSE DRINK PLENTY OF WATER TO HELP THIN MUCUS       Sinusitis Sinusitis is redness, soreness, and puffiness (inflammation) of the air pockets in the bones of your face (sinuses). The redness, soreness, and puffiness can cause air and mucus to get trapped in your sinuses. This can allow germs to grow and cause an infection.  HOME CARE   Drink enough fluids to keep your pee (urine) clear or pale yellow.  Use a humidifier in your home.  Run a hot shower to create steam in the bathroom. Sit in the bathroom with the door closed. Breathe in the steam 3-4 times a day.  Put a warm, moist washcloth on your face 3-4 times a day, or as told by your doctor.  Use salt water sprays (saline sprays) to wet the thick fluid in your nose. This can help the sinuses drain.  Only take medicine as told by your doctor. GET HELP RIGHT AWAY IF:   Your pain gets worse.  You have very bad headaches.  You are sick to your stomach (nauseous).  You throw up (vomit).  You are very sleepy (drowsy) all the time.  Your face is puffy (swollen).  Your vision changes.  You have a stiff neck.  You have trouble breathing. MAKE SURE YOU:   Understand these instructions.  Will watch your condition.  Will get help right away if you are not doing well or get worse. Document Released: 08/04/2007 Document Revised: 11/10/2011 Document Reviewed: 09/21/2011 [facility] Patient Information 2015 [place], [place]. This information is not intended to replace advice given to you by your health care provider. Make sure you discuss any questions you have with your health care provider.

## 2014-06-10 NOTE — Progress Notes (Signed)
   Subjective:    Patient ID: Montario Toniann Fail., male    DOB: 1940/08/12, 74 y.o.   MRN: [id number]  HPI 74 y/o male presents with pain/pressure in face x 1-2 months. Has tried saline nasal spray, afrin, otc sinus and allergy pills with no relief.     Review of Systems  Constitutional: Negative.  Negative for diaphoresis.  HENT: Positive for sinus pressure and sneezing. Negative for congestion and ear pain.   Respiratory: Positive for cough (nonproductive ).   Cardiovascular: Negative.   Gastrointestinal: Negative.   Neurological: Positive for headaches.       Objective:   Physical Exam  Constitutional: He appears well-developed and well-nourished. No distress.  HENT:  Head: Normocephalic and atraumatic.  Right Ear: External ear normal.  Left Ear: External ear normal.  Mouth/Throat: Oropharynx is clear and moist. No oropharyngeal exudate.  TTP on bilateral maxillary sinuses  Neck: Normal range of motion.  Cardiovascular: Normal rate, regular rhythm and normal heart sounds.  Exam reveals no gallop and no friction rub.   No murmur heard. Pulmonary/Chest: Effort normal and breath sounds normal. No respiratory distress. He has no wheezes. He has no rales. He exhibits no tenderness.  Skin: He is not diaphoretic.  Psychiatric: He has a normal mood and affect. His behavior is normal. Judgment and thought content normal.  Nursing note and vitals reviewed.         Assessment & Plan:  1. Acute Sinusitis: Augmentin 875/125 BID x 10 days.   - Flonase as directed - - Take meds as prescribed - Use a cool mist humidifier  -Use saline nose sprays frequently -Saline irrigations of the nose can be very helpful if done frequently.  * 4X daily for 1 week*  * Use of a nettie pot can be helpful with this. Follow directions with this* -Force fluids -For any cough or congestion  Use plain Mucinex- regular strength or max strength is fine - for fevers greater than 101 orally you may  alternate ibuprofen and tylenol every  3 hours. -Throat lozenges if help   Marline Backbone, PA-C

## 2014-07-01 ENCOUNTER — Ambulatory Visit: Payer: Medicare Other | Admitting: Physician Assistant

## 2014-07-24 ENCOUNTER — Encounter: Payer: Self-pay | Admitting: Physician Assistant

## 2014-07-24 ENCOUNTER — Ambulatory Visit (INDEPENDENT_AMBULATORY_CARE_PROVIDER_SITE_OTHER): Payer: Medicare Other | Admitting: Physician Assistant

## 2014-07-24 ENCOUNTER — Ambulatory Visit (INDEPENDENT_AMBULATORY_CARE_PROVIDER_SITE_OTHER): Payer: Medicare Other

## 2014-07-24 VITALS — BP 135/73 | HR 51 | Temp 96.8°F | Ht 66.5 in | Wt 161.0 lb

## 2014-07-24 DIAGNOSIS — M25559 Pain in unspecified hip: Secondary | ICD-10-CM | POA: Diagnosis not present

## 2014-07-24 DIAGNOSIS — Z0000 Encounter for general adult medical examination without abnormal findings: Secondary | ICD-10-CM

## 2014-07-24 DIAGNOSIS — Z87438 Personal history of other diseases of male genital organs: Secondary | ICD-10-CM | POA: Diagnosis not present

## 2014-07-24 DIAGNOSIS — G8929 Other chronic pain: Secondary | ICD-10-CM | POA: Diagnosis not present

## 2014-07-24 LAB — POCT CBC
Granulocyte percent: 45.3 %G (ref 37–80)
HCT, POC: 46.8 % (ref 43.5–53.7)
Hemoglobin: 14.9 g/dL (ref 14.1–18.1)
Lymph, poc: 3.6 — AB (ref 0.6–3.4)
MCH, POC: 29.2 pg (ref 27–31.2)
MCHC: 31.9 g/dL (ref 31.8–35.4)
MCV: 91.6 fL (ref 80–97)
MPV: 8.1 fL (ref 0–99.8)
POC Granulocyte: 3.6 (ref 2–6.9)
POC LYMPH PERCENT: 45.4 %L (ref 10–50)
Platelet Count, POC: 213 10*3/uL (ref 142–424)
RBC: 5.11 M/uL (ref 4.69–6.13)
RDW, POC: 13.3 %
WBC: 8 10*3/uL (ref 4.6–10.2)

## 2014-07-24 IMAGING — CR DG PELVIS 1-2V
1 series · 1 of 1 positions shown · non-contrast
Comparison: None.

CLINICAL DATA: Bilateral hip pain, no known injury, initial
encounter

EXAM:
PELVIS - 1-2 VIEW

[view not recorded]
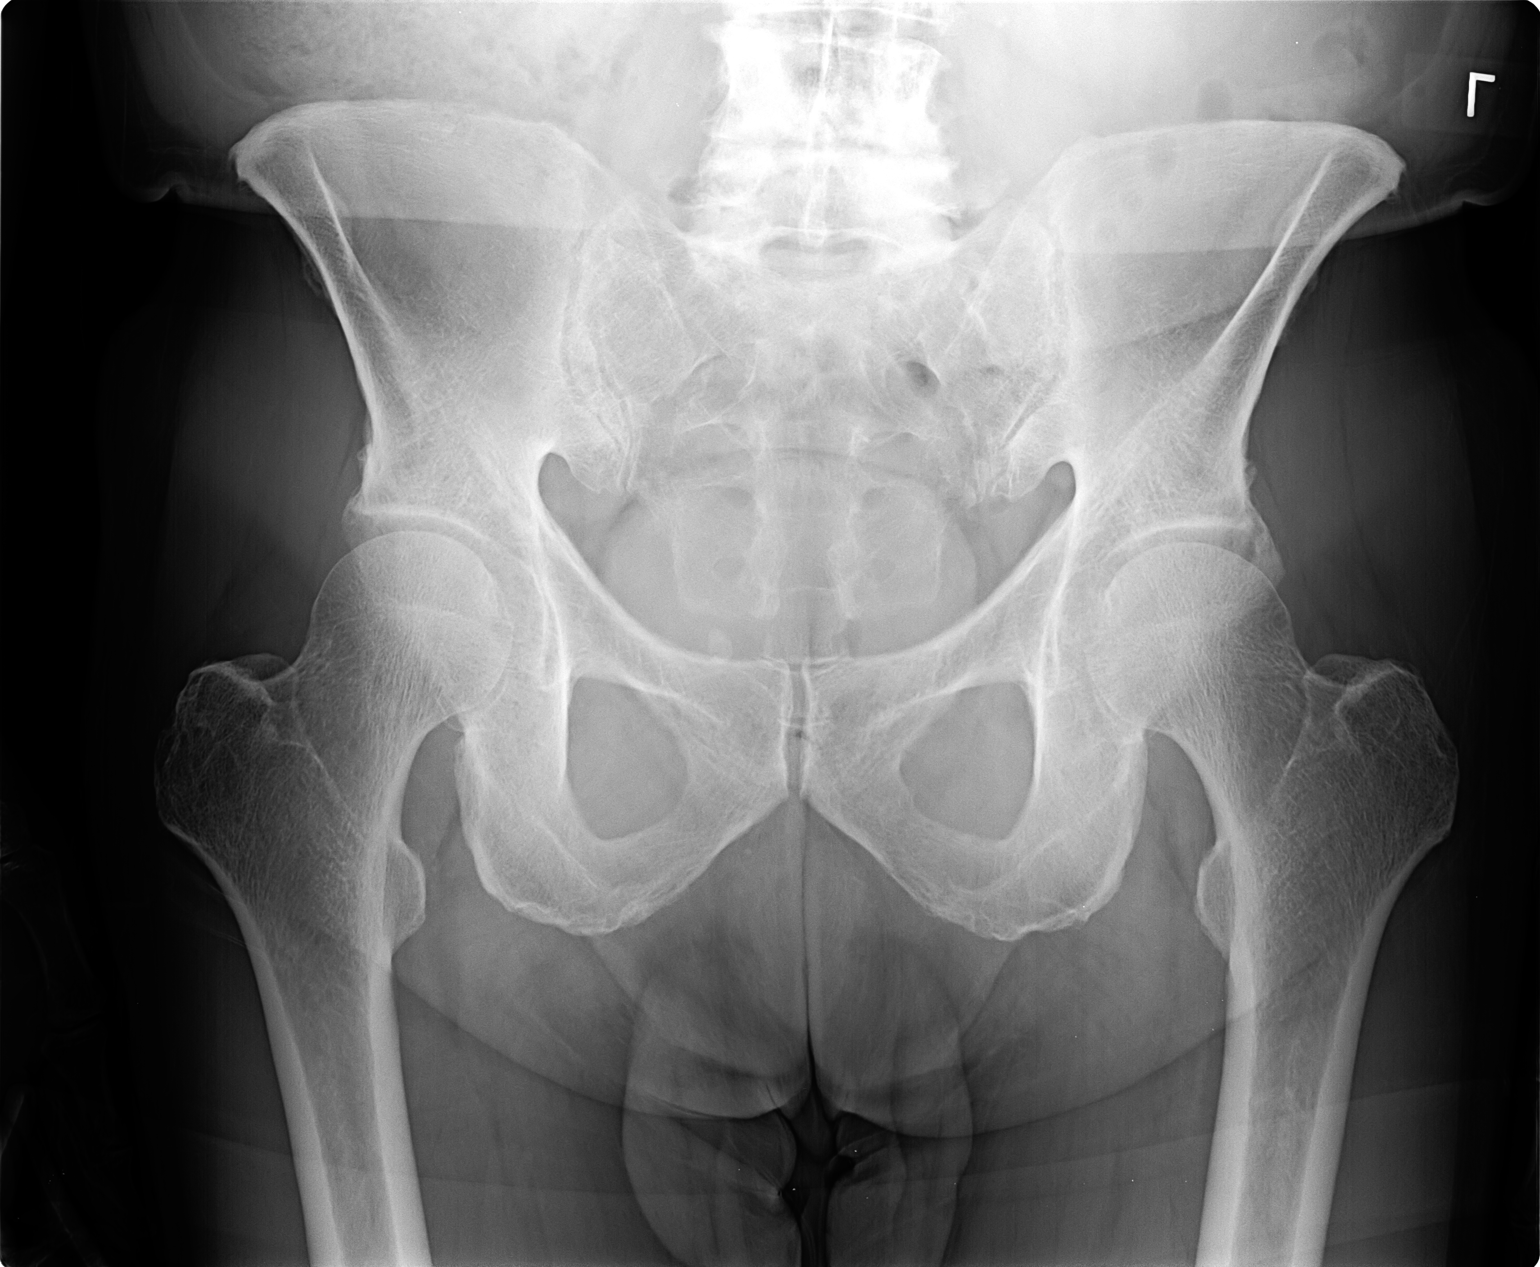

[1 of 1 positions shown; findings below may reference images not displayed]

FINDINGS: No acute fracture is noted. Degenerative changes of the lumbar spine
are seen. No soft tissue changes are noted.
IMPRESSION: No acute abnormality noted.

## 2014-07-24 MED ORDER — TRAMADOL HCL 50 MG PO TABS: 50.0000 mg | ORAL_TABLET | Freq: Three times a day (TID) | ORAL | Status: DC | PRN

## 2014-07-24 MED ORDER — MELOXICAM 15 MG PO TABS: 15.0000 mg | ORAL_TABLET | Freq: Every day | ORAL | Status: DC

## 2014-07-24 NOTE — Progress Notes (Signed)
   Subjective:    Patient ID: Keith Toniann Fail., male    DOB: [birth year], 74 y.o.   MRN: [id number]  HPI 74 y/o male presents with c/o worsening bilateral hip pain x several months. He has had this issue in the past. Has attempted treatment with antiinflammatories, tramdol, steroid injection with only temporary relief. Pain is worse with bending, sitting long amount of time. Pain is relieved some with a heating pad at night   He has a h/o BPH and would like his PSA checked prior to making a follow up appointment with Urologist.     Review of Systems  Endocrine: Positive for polyuria.  Genitourinary: Positive for frequency. Negative for hematuria.  Musculoskeletal: Arthralgias: chronic bilateral shoulder and hip pain, intermittent, aching. Worse with standing.  Neurological: Negative for weakness and numbness.       Objective:   Physical Exam  Constitutional: He is oriented to person, place, and time.  Musculoskeletal: He exhibits no edema or tenderness.  Xray of pelvis shows degenerative changes Full AROM with forward flexion Reduced AROM with hip abduction, adduction     Neurological: He is alert and oriented to person, place, and time.  Psychiatric: He has a normal mood and affect. His behavior is normal. Judgment and thought content normal.          Assessment & Plan:  1. Hip pain, chronic, unspecified laterality  - DG Pelvis 1-2 Views - traMADol (ULTRAM) 50 MG tablet; Take 1 tablet (50 mg total) by mouth every 8 (eight) hours as needed.  Dispense: 90 tablet; Refill: 0 - meloxicam (MOBIC) 15 MG tablet; Take 1 tablet (15 mg total) by mouth daily.  Dispense: 30 tablet; Refill: 1  2. Preventative health care  - POCT CBC - PSA, total and free - CMP14+EGFR  3. History of BPH  - PSA, total and free   Continue all meds Labs pending Health Maintenance reviewed Diet and exercise encouraged RTO 2 weeks   Josi Roediger A. Benjamin Stain PA-C

## 2014-07-25 LAB — CMP14+EGFR
ALT: 25 IU/L (ref 0–44)
AST: 23 IU/L (ref 0–40)
Albumin/Globulin Ratio: 1.8 (ref 1.1–2.5)
Albumin: 4.8 g/dL (ref 3.5–4.8)
Alkaline Phosphatase: 69 IU/L (ref 39–117)
BUN/Creatinine Ratio: 10 (ref 10–22)
BUN: 8 mg/dL (ref 8–27)
Bilirubin Total: 0.3 mg/dL (ref 0.0–1.2)
CO2: 24 mmol/L (ref 18–29)
Calcium: 9.6 mg/dL (ref 8.6–10.2)
Chloride: 100 mmol/L (ref 97–108)
Creatinine, Ser: 0.84 mg/dL (ref 0.76–1.27)
GFR calc Af Amer: 101 mL/min/{1.73_m2} (ref >59)
GFR calc non Af Amer: 87 mL/min/{1.73_m2} (ref >59)
Globulin, Total: 2.6 g/dL (ref 1.5–4.5)
Glucose: 93 mg/dL (ref 65–99)
Potassium: 4.7 mmol/L (ref 3.5–5.2)
Sodium: 141 mmol/L (ref 134–144)
Total Protein: 7.4 g/dL (ref 6.0–8.5)

## 2014-07-25 LAB — PSA, TOTAL AND FREE
PSA, Free Pct: 15.2 %
PSA, Free: 0.76 ng/mL
Prostate Specific Ag, Serum: 5 ng/mL — ABNORMAL HIGH (ref 0.0–4.0)

## 2014-07-30 DIAGNOSIS — R339 Retention of urine, unspecified: Secondary | ICD-10-CM | POA: Diagnosis not present

## 2014-07-30 DIAGNOSIS — R972 Elevated prostate specific antigen [PSA]: Secondary | ICD-10-CM | POA: Diagnosis not present

## 2014-07-31 DIAGNOSIS — C61 Malignant neoplasm of prostate: Secondary | ICD-10-CM

## 2014-07-31 HISTORY — DX: Malignant neoplasm of prostate: C61

## 2014-08-05 DIAGNOSIS — N319 Neuromuscular dysfunction of bladder, unspecified: Secondary | ICD-10-CM | POA: Diagnosis not present

## 2014-08-05 DIAGNOSIS — N138 Other obstructive and reflux uropathy: Secondary | ICD-10-CM | POA: Diagnosis not present

## 2014-08-05 DIAGNOSIS — R339 Retention of urine, unspecified: Secondary | ICD-10-CM | POA: Diagnosis not present

## 2014-08-05 DIAGNOSIS — R972 Elevated prostate specific antigen [PSA]: Secondary | ICD-10-CM | POA: Diagnosis not present

## 2014-08-06 ENCOUNTER — Other Ambulatory Visit: Payer: Self-pay | Admitting: Urology

## 2014-08-23 NOTE — Patient Instructions (Addendum)
Rilan Toniann Fail.  08/23/2014   Your procedure is scheduled on:    08/28/2014    Report to [facility]  Entrance take [facility]  elevators to 3rd floor to  [place] at      [place] AM.  Call this number if you have problems the morning of surgery [phone]   Remember: ONLY 1 PERSON MAY GO WITH YOU TO SHORT STAY TO GET  READY MORNING OF [place].  Do not eat food or drink liquids :After Midnight.     Take these medicines the morning of surgery with A SIP OF WATER:  Xanax if needed, Flonase, Levofloxacin , flomax                                You may not have any metal on your body including hair pins and              piercings  Do not wear jewelry, , lotions, powders or perfumes, deodorant                         Men may shave face and neck.   Do not bring valuables to the hospital. [place].  Contacts, dentures or bridgework may not be worn into surgery.  Leave suitcase in the car. After surgery it may be brought to your room.         Special Instructions: coughing and deep breathing exercises, leg exercises               Please read over the following fact sheets you were given: _____________________________________________________________________             [facility] - Preparing for Surgery Before surgery, you can play an important role.  Because skin is not sterile, your skin needs to be as free of germs as possible.  You can reduce the number of germs on your skin by washing with CHG (chlorahexidine gluconate) soap before surgery.  CHG is an antiseptic cleaner which kills germs and bonds with the skin to continue killing germs even after washing. Please DO NOT use if you have an allergy to CHG or antibacterial soaps.  If your skin becomes reddened/irritated stop using the CHG and inform your nurse when you arrive at Short Stay. Do not shave (including legs and underarms) for at least 48  hours prior to the first CHG shower.  You may shave your face/neck. Please follow these instructions carefully:  1.  Shower with CHG Soap the night before surgery and the  morning of Surgery.  2.  If you choose to wash your hair, wash your hair first as usual with your  normal  shampoo.  3.  After you shampoo, rinse your hair and body thoroughly to remove the  shampoo.                           4.  Use CHG as you would any other liquid soap.  You can apply chg directly  to the skin and wash                       Gently  with a scrungie or clean washcloth.  5.  Apply the CHG Soap to your body ONLY FROM THE NECK DOWN.   Do not use on face/ open                           Wound or open sores. Avoid contact with eyes, ears mouth and genitals (private parts).                       Wash face,  Genitals (private parts) with your normal soap.             6.  Wash thoroughly, paying special attention to the area where your surgery  will be performed.  7.  Thoroughly rinse your body with warm water from the neck down.  8.  DO NOT shower/wash with your normal soap after using and rinsing off  the CHG Soap.                9.  Pat yourself dry with a clean towel.            10.  Wear clean pajamas.            11.  Place clean sheets on your bed the night of your first shower and do not  sleep with pets. Day of Surgery : Do not apply any lotions/deodorants the morning of surgery.  Please wear clean clothes to the hospital/surgery center.  FAILURE TO FOLLOW THESE INSTRUCTIONS MAY RESULT IN THE CANCELLATION OF YOUR SURGERY PATIENT SIGNATURE_________________________________  NURSE SIGNATURE__________________________________  ________________________________________________________________________

## 2014-08-26 ENCOUNTER — Encounter (HOSPITAL_COMMUNITY)
Admission: RE | Admit: 2014-08-26 | Discharge: 2014-08-26 | Disposition: A | Discharge status: Home / Self Care | Payer: Medicare Other | Source: Ambulatory Visit | Attending: Urology | Admitting: Urology

## 2014-08-26 ENCOUNTER — Encounter (HOSPITAL_COMMUNITY): Payer: Self-pay

## 2014-08-26 DIAGNOSIS — R3914 Feeling of incomplete bladder emptying: Secondary | ICD-10-CM | POA: Diagnosis not present

## 2014-08-26 DIAGNOSIS — N401 Enlarged prostate with lower urinary tract symptoms: Secondary | ICD-10-CM | POA: Diagnosis not present

## 2014-08-26 DIAGNOSIS — R351 Nocturia: Secondary | ICD-10-CM | POA: Diagnosis not present

## 2014-08-26 DIAGNOSIS — R350 Frequency of micturition: Secondary | ICD-10-CM | POA: Diagnosis not present

## 2014-08-26 DIAGNOSIS — R3912 Poor urinary stream: Secondary | ICD-10-CM | POA: Diagnosis not present

## 2014-08-26 DIAGNOSIS — R3911 Hesitancy of micturition: Secondary | ICD-10-CM | POA: Diagnosis not present

## 2014-08-26 DIAGNOSIS — C61 Malignant neoplasm of prostate: Secondary | ICD-10-CM | POA: Diagnosis not present

## 2014-08-26 DIAGNOSIS — Z8042 Family history of malignant neoplasm of prostate: Secondary | ICD-10-CM | POA: Diagnosis not present

## 2014-08-26 DIAGNOSIS — E780 Pure hypercholesterolemia: Secondary | ICD-10-CM | POA: Diagnosis not present

## 2014-08-26 HISTORY — DX: Headache: R51

## 2014-08-26 HISTORY — DX: Atherosclerotic heart disease of native coronary artery without angina pectoris: I25.10

## 2014-08-26 HISTORY — DX: Contact with and (suspected) exposure to other hazardous, chiefly nonmedicinal, chemicals: Z77.098

## 2014-08-26 HISTORY — DX: Post-traumatic stress disorder, unspecified: F43.10

## 2014-08-26 HISTORY — DX: Headache, unspecified: R51.9

## 2014-08-26 LAB — CBC
HCT: 42.2 % (ref 39.0–52.0)
Hemoglobin: 13.9 g/dL (ref 13.0–17.0)
MCH: 30 pg (ref 26.0–34.0)
MCHC: 32.9 g/dL (ref 30.0–36.0)
MCV: 91.1 fL (ref 78.0–100.0)
Platelets: 193 10*3/uL (ref 150–400)
RBC: 4.63 MIL/uL (ref 4.22–5.81)
RDW: 13 % (ref 11.5–15.5)
WBC: 6.1 10*3/uL (ref 4.0–10.5)

## 2014-08-26 LAB — BASIC METABOLIC PANEL
Anion gap: 10 (ref 5–15)
BUN: 10 mg/dL (ref 6–20)
CO2: 25 mmol/L (ref 22–32)
Calcium: 9 mg/dL (ref 8.9–10.3)
Chloride: 105 mmol/L (ref 101–111)
Creatinine, Ser: 0.8 mg/dL (ref 0.61–1.24)
GFR calc Af Amer: >60 mL/min (ref >60)
GFR calc non Af Amer: >60 mL/min (ref >60)
Glucose, Bld: 78 mg/dL (ref 65–99)
Potassium: 4.1 mmol/L (ref 3.5–5.1)
Sodium: 140 mmol/L (ref 135–145)

## 2014-08-26 NOTE — Progress Notes (Addendum)
EKG- 09/24/13 EPIC  2V CXR- 09/24/2013 EPIC  07/24/2014- LOV with PCP in EPIc with PA

## 2014-08-28 ENCOUNTER — Observation Stay (HOSPITAL_COMMUNITY)
Admission: RE | Admit: 2014-08-28 | Discharge: 2014-08-29 | Disposition: A | Discharge status: Home / Self Care | Payer: Medicare Other | Source: Ambulatory Visit | Attending: Urology | Admitting: Urology

## 2014-08-28 ENCOUNTER — Encounter (HOSPITAL_COMMUNITY)
Admission: RE | Disposition: A | Discharge status: Home / Self Care | Payer: Self-pay | Source: Ambulatory Visit | Attending: Urology

## 2014-08-28 ENCOUNTER — Other Ambulatory Visit: Payer: Self-pay

## 2014-08-28 ENCOUNTER — Ambulatory Visit (HOSPITAL_COMMUNITY): Payer: Medicare Other | Admitting: Registered Nurse

## 2014-08-28 ENCOUNTER — Encounter (HOSPITAL_COMMUNITY): Payer: Self-pay | Admitting: *Deleted

## 2014-08-28 DIAGNOSIS — R3911 Hesitancy of micturition: Secondary | ICD-10-CM | POA: Diagnosis not present

## 2014-08-28 DIAGNOSIS — E780 Pure hypercholesterolemia: Secondary | ICD-10-CM | POA: Diagnosis not present

## 2014-08-28 DIAGNOSIS — M1990 Unspecified osteoarthritis, unspecified site: Secondary | ICD-10-CM | POA: Diagnosis not present

## 2014-08-28 DIAGNOSIS — R3912 Poor urinary stream: Secondary | ICD-10-CM | POA: Diagnosis not present

## 2014-08-28 DIAGNOSIS — N401 Enlarged prostate with lower urinary tract symptoms: Secondary | ICD-10-CM | POA: Insufficient documentation

## 2014-08-28 DIAGNOSIS — N138 Other obstructive and reflux uropathy: Secondary | ICD-10-CM | POA: Diagnosis present

## 2014-08-28 DIAGNOSIS — R3914 Feeling of incomplete bladder emptying: Secondary | ICD-10-CM | POA: Diagnosis not present

## 2014-08-28 DIAGNOSIS — Z8042 Family history of malignant neoplasm of prostate: Secondary | ICD-10-CM | POA: Diagnosis not present

## 2014-08-28 DIAGNOSIS — R351 Nocturia: Secondary | ICD-10-CM | POA: Insufficient documentation

## 2014-08-28 DIAGNOSIS — C61 Malignant neoplasm of prostate: Principal | ICD-10-CM | POA: Insufficient documentation

## 2014-08-28 DIAGNOSIS — R350 Frequency of micturition: Secondary | ICD-10-CM | POA: Diagnosis not present

## 2014-08-28 DIAGNOSIS — R972 Elevated prostate specific antigen [PSA]: Secondary | ICD-10-CM | POA: Diagnosis not present

## 2014-08-28 DIAGNOSIS — N400 Enlarged prostate without lower urinary tract symptoms: Secondary | ICD-10-CM | POA: Diagnosis not present

## 2014-08-28 HISTORY — PX: TRANSURETHRAL RESECTION OF PROSTATE: SHX73

## 2014-08-28 HISTORY — PX: PROSTATE BIOPSY: SHX241

## 2014-08-28 LAB — CK TOTAL AND CKMB (NOT AT ARMC)
CK, MB: 3.3 ng/mL (ref 0.5–5.0)
Relative Index: INVALID (ref 0.0–2.5)
Total CK: 87 U/L (ref 49–397)

## 2014-08-28 LAB — TROPONIN I: Troponin I: <0.03 ng/mL (ref <0.031)

## 2014-08-28 SURGERY — TRANSURETHRAL RESECTION OF THE PROSTATE WITH GYRUS INSTRUMENTS
Anesthesia: General

## 2014-08-28 MED ORDER — LACTATED RINGERS IV SOLN: INTRAVENOUS | Status: DC

## 2014-08-28 MED ORDER — ONDANSETRON HCL 4 MG/2ML IJ SOLN
INTRAMUSCULAR | Status: AC
  Filled 2014-08-28: qty 2

## 2014-08-28 MED ORDER — MIDAZOLAM HCL 2 MG/2ML IJ SOLN
INTRAMUSCULAR | Status: AC
  Filled 2014-08-28: qty 2

## 2014-08-28 MED ORDER — FENTANYL CITRATE (PF) 250 MCG/5ML IJ SOLN
INTRAMUSCULAR | Status: AC
  Filled 2014-08-28: qty 5

## 2014-08-28 MED ORDER — FENTANYL CITRATE (PF) 100 MCG/2ML IJ SOLN
25.0000 ug | INTRAMUSCULAR | Status: DC | PRN
  Administered 2014-08-28 (×2): 50 ug via INTRAVENOUS

## 2014-08-28 MED ORDER — BELLADONNA ALKALOIDS-OPIUM 16.2-60 MG RE SUPP
1.0000 | Freq: Four times a day (QID) | RECTAL | Status: DC | PRN
  Administered 2014-08-28: 1 via RECTAL
  Filled 2014-08-28: qty 1

## 2014-08-28 MED ORDER — ONDANSETRON HCL 4 MG/2ML IJ SOLN: 4.0000 mg | INTRAMUSCULAR | Status: DC | PRN

## 2014-08-28 MED ORDER — MEPERIDINE HCL 50 MG/ML IJ SOLN: 6.2500 mg | INTRAMUSCULAR | Status: DC | PRN

## 2014-08-28 MED ORDER — DOCUSATE SODIUM 100 MG PO CAPS
100.0000 mg | ORAL_CAPSULE | Freq: Two times a day (BID) | ORAL | Status: DC
Start: 2014-08-28 — End: 2014-08-29
  Administered 2014-08-28 – 2014-08-29 (×3): 100 mg via ORAL
  Filled 2014-08-28 (×4): qty 1

## 2014-08-28 MED ORDER — MORPHINE SULFATE 10 MG/ML IJ SOLN
2.0000 mg | INTRAMUSCULAR | Status: DC | PRN
  Filled 2014-08-28: qty 1

## 2014-08-28 MED ORDER — FLUTICASONE PROPIONATE 50 MCG/ACT NA SUSP
2.0000 | Freq: Every day | NASAL | Status: DC
  Filled 2014-08-28: qty 16

## 2014-08-28 MED ORDER — MORPHINE SULFATE 2 MG/ML IJ SOLN
2.0000 mg | INTRAMUSCULAR | Status: DC | PRN
  Administered 2014-08-28 (×2): 4 mg via INTRAVENOUS
  Administered 2014-08-28: 2 mg via INTRAVENOUS
  Administered 2014-08-28: 4 mg via INTRAVENOUS
  Administered 2014-08-29: 2 mg via INTRAVENOUS
  Filled 2014-08-28 (×3): qty 2
  Filled 2014-08-28: qty 1

## 2014-08-28 MED ORDER — PROPOFOL 10 MG/ML IV BOLUS
INTRAVENOUS | Status: AC
  Filled 2014-08-28: qty 20

## 2014-08-28 MED ORDER — EPHEDRINE SULFATE 50 MG/ML IJ SOLN
INTRAMUSCULAR | Status: DC | PRN
  Administered 2014-08-28: 10 mg via INTRAVENOUS
  Administered 2014-08-28: 5 mg via INTRAVENOUS
  Administered 2014-08-28: 10 mg via INTRAVENOUS

## 2014-08-28 MED ORDER — TRAMADOL HCL 50 MG PO TABS: 50.0000 mg | ORAL_TABLET | Freq: Three times a day (TID) | ORAL | Status: DC | PRN

## 2014-08-28 MED ORDER — ONDANSETRON HCL 4 MG/2ML IJ SOLN
INTRAMUSCULAR | Status: DC | PRN
  Administered 2014-08-28: 4 mg via INTRAVENOUS

## 2014-08-28 MED ORDER — LACTATED RINGERS IV SOLN
INTRAVENOUS | Status: DC | PRN
  Administered 2014-08-28: 08:00:00 via INTRAVENOUS

## 2014-08-28 MED ORDER — LIDOCAINE HCL 2 % EX GEL
CUTANEOUS | Status: AC
  Filled 2014-08-28: qty 10

## 2014-08-28 MED ORDER — DEXTROSE 5 % IV SOLN
2.0000 g | INTRAVENOUS | Status: AC
  Administered 2014-08-28: 2 g via INTRAVENOUS

## 2014-08-28 MED ORDER — MIDAZOLAM HCL 5 MG/5ML IJ SOLN
INTRAMUSCULAR | Status: DC | PRN
  Administered 2014-08-28: 2 mg via INTRAVENOUS

## 2014-08-28 MED ORDER — SODIUM CHLORIDE 0.9 % IJ SOLN
INTRAMUSCULAR | Status: AC
  Filled 2014-08-28: qty 10

## 2014-08-28 MED ORDER — SODIUM CHLORIDE 0.9 % IR SOLN
Status: DC | PRN
  Administered 2014-08-28: 18000 mL

## 2014-08-28 MED ORDER — LIDOCAINE HCL 2 % EX GEL
CUTANEOUS | Status: DC | PRN
  Administered 2014-08-28: 1 via URETHRAL

## 2014-08-28 MED ORDER — EPHEDRINE SULFATE 50 MG/ML IJ SOLN
INTRAMUSCULAR | Status: AC
  Filled 2014-08-28: qty 1

## 2014-08-28 MED ORDER — PROPOFOL 10 MG/ML IV BOLUS
INTRAVENOUS | Status: DC | PRN
  Administered 2014-08-28: 170 mg via INTRAVENOUS

## 2014-08-28 MED ORDER — OXYCODONE-ACETAMINOPHEN 5-325 MG PO TABS
1.0000 | ORAL_TABLET | ORAL | Status: DC | PRN
  Administered 2014-08-28: 2 via ORAL
  Administered 2014-08-29: 1 via ORAL
  Administered 2014-08-29: 2 via ORAL
  Filled 2014-08-28: qty 2
  Filled 2014-08-28: qty 1
  Filled 2014-08-28: qty 2

## 2014-08-28 MED ORDER — ALPRAZOLAM 1 MG PO TABS
1.0000 mg | ORAL_TABLET | Freq: Two times a day (BID) | ORAL | Status: DC | PRN
  Administered 2014-08-28 – 2014-08-29 (×3): 1 mg via ORAL
  Filled 2014-08-28 (×3): qty 1

## 2014-08-28 MED ORDER — PROMETHAZINE HCL 25 MG/ML IJ SOLN: 6.2500 mg | INTRAMUSCULAR | Status: DC | PRN

## 2014-08-28 MED ORDER — MIDAZOLAM HCL 2 MG/2ML IJ SOLN
0.5000 mg | INTRAMUSCULAR | Status: DC
  Administered 2014-08-28 (×2): 0.5 mg via INTRAVENOUS

## 2014-08-28 MED ORDER — NITROGLYCERIN 0.4 MG SL SUBL
0.4000 mg | SUBLINGUAL_TABLET | SUBLINGUAL | Status: DC | PRN
  Administered 2014-08-28: 0.4 mg via SUBLINGUAL

## 2014-08-28 MED ORDER — LIDOCAINE HCL (CARDIAC) 20 MG/ML IV SOLN
INTRAVENOUS | Status: DC | PRN
  Administered 2014-08-28: 60 mg via INTRAVENOUS

## 2014-08-28 MED ORDER — KCL IN DEXTROSE-NACL 20-5-0.9 MEQ/L-%-% IV SOLN
INTRAVENOUS | Status: DC
  Administered 2014-08-28 – 2014-08-29 (×2): via INTRAVENOUS
  Filled 2014-08-28 (×3): qty 1000

## 2014-08-28 MED ORDER — NITROGLYCERIN 0.4 MG SL SUBL
SUBLINGUAL_TABLET | SUBLINGUAL | Status: AC
  Filled 2014-08-28: qty 1

## 2014-08-28 MED ORDER — LIDOCAINE HCL (CARDIAC) 20 MG/ML IV SOLN
INTRAVENOUS | Status: AC
  Filled 2014-08-28: qty 5

## 2014-08-28 MED ORDER — ROSUVASTATIN CALCIUM 10 MG PO TABS
10.0000 mg | ORAL_TABLET | Freq: Every day | ORAL | Status: DC
  Administered 2014-08-28 – 2014-08-29 (×2): 10 mg via ORAL
  Filled 2014-08-28 (×2): qty 1

## 2014-08-28 MED ORDER — TAMSULOSIN HCL 0.4 MG PO CAPS
0.4000 mg | ORAL_CAPSULE | Freq: Every day | ORAL | Status: DC
  Administered 2014-08-28 – 2014-08-29 (×2): 0.4 mg via ORAL
  Filled 2014-08-28 (×2): qty 1

## 2014-08-28 MED ORDER — FENTANYL CITRATE (PF) 100 MCG/2ML IJ SOLN
INTRAMUSCULAR | Status: DC | PRN
  Administered 2014-08-28: 100 ug via INTRAVENOUS

## 2014-08-28 MED ORDER — FENTANYL CITRATE (PF) 100 MCG/2ML IJ SOLN
INTRAMUSCULAR | Status: AC
  Filled 2014-08-28: qty 2

## 2014-08-28 MED ORDER — CEFTRIAXONE SODIUM 2 G IJ SOLR
INTRAMUSCULAR | Status: AC
  Filled 2014-08-28: qty 2

## 2014-08-28 SURGICAL SUPPLY — 21 items
BAG URINE DRAINAGE (UROLOGICAL SUPPLIES) ×2 IMPLANT
BAG URO CATCHER STRL LF (DRAPE) ×2 IMPLANT
BLADE SURG 15 STRL LF DISP TIS (BLADE) IMPLANT
BLADE SURG 15 STRL SS (BLADE)
CATH FOLEY 3WAY 30CC 22FR (CATHETERS) ×2 IMPLANT
DRAPE CAMERA CLOSED 9X96 (DRAPES) ×1 IMPLANT
ELECT REM PT RETURN 9FT ADLT (ELECTROSURGICAL)
ELECTRODE REM PT RTRN 9FT ADLT (ELECTROSURGICAL) ×1 IMPLANT
EVACUATOR MICROVAS BLADDER (UROLOGICAL SUPPLIES) ×1 IMPLANT
GLOVE BIOGEL M STRL SZ7.5 (GLOVE) ×4 IMPLANT
GOWN STRL REUS W/TWL LRG LVL3 (GOWN DISPOSABLE) ×5 IMPLANT
HOLDER FOLEY CATH W/STRAP (MISCELLANEOUS) ×1 IMPLANT
IV NS IRRIG 3000ML ARTHROMATIC (IV SOLUTION) ×2 IMPLANT
KIT ASPIRATION TUBING (SET/KITS/TRAYS/PACK) ×2 IMPLANT
LOOP CUT BIPOLAR 24F LRG (ELECTROSURGICAL) ×2 IMPLANT
MANIFOLD NEPTUNE II (INSTRUMENTS) ×2 IMPLANT
PACK CYSTO (CUSTOM PROCEDURE TRAY) ×2 IMPLANT
SUT ETHILON 3 0 PS 1 (SUTURE) IMPLANT
SYR 30ML LL (SYRINGE) ×1 IMPLANT
SYRINGE IRR TOOMEY STRL 70CC (SYRINGE) ×1 IMPLANT
TUBING CONNECTING 10 (TUBING) ×2 IMPLANT

## 2014-08-28 NOTE — Transfer of Care (Signed)
Immediate Anesthesia Transfer of Care Note  Patient: Keith Olson.  Procedure(s) Performed: Procedure(s): TRANSURETHRAL RESECTION OF THE PROSTATE WITH GYRUS INSTRUMENTS (N/A) BIOPSY TRANSRECTAL ULTRASONIC PROSTATE (TUBP) (N/A)  Patient Location: PACU  Anesthesia Type:General  Level of Consciousness: awake, alert , oriented and patient cooperative  Airway & Oxygen Therapy: Patient Spontanous Breathing and Patient connected to face mask oxygen  Post-op Assessment: Report given to RN, Post -op Vital signs reviewed and stable and Patient moving all extremities  Post vital signs: Reviewed and stable  Last Vitals:  Filed Vitals:   08/28/14 0609  BP: 133/65  Pulse: 59  Temp: 36.7 C  Resp: 18    Complications: No apparent anesthesia complications

## 2014-08-28 NOTE — Care Management Note (Signed)
Case Management Note  Patient Details  Name: Keith Olson. MRN: [id number] Date of Birth: Apr 05, 1940  Subjective/Objective: 74 y/o m admitted w/BPH. For TURP. From home.                   Action/Plan:d/c plan home. No anticipated d/c needs.   Expected Discharge Date:                  Expected Discharge Plan:  Home/Self Care  In-House Referral:     Discharge planning Services  CM Consult  Post Acute Care Choice:    Choice offered to:     DME Arranged:    DME Agency:     HH Arranged:    HH Agency:     Status of Service:  In process, will continue to follow  Medicare Important Message Given:    Date Medicare IM Given:    Medicare IM give by:    Date Additional Medicare IM Given:    Additional Medicare Important Message give by:     If discussed at [place] of Stay Meetings, dates discussed:    Additional Comments:  Dessa Phi, RN 08/28/2014, 12:52 PM

## 2014-08-28 NOTE — Anesthesia Preprocedure Evaluation (Addendum)
Anesthesia Evaluation  Patient identified by MRN, date of birth, ID band Patient awake    Reviewed: Allergy & Precautions, NPO status , Patient's Chart, lab work & pertinent test results  Airway Mallampati: II  TM Distance: >3 FB Neck ROM: Full    Dental no notable dental hx. (+) Poor Dentition   Pulmonary neg pulmonary ROS,  breath sounds clear to auscultation  Pulmonary exam normal       Cardiovascular Exercise Tolerance: Good + CAD and + Cardiac Stents (2002) Normal cardiovascular examRhythm:Regular Rate:Normal  Runs 3 miles/day   Neuro/Psych negative neurological ROS  negative psych ROS   GI/Hepatic negative GI ROS, Neg liver ROS,   Endo/Other  negative endocrine ROS  Renal/GU negative Renal ROS  negative genitourinary   Musculoskeletal negative musculoskeletal ROS (+)   Abdominal   Peds negative pediatric ROS (+)  Hematology negative hematology ROS (+)   Anesthesia Other Findings   Reproductive/Obstetrics negative OB ROS                           Anesthesia Physical Anesthesia Plan  ASA: III  Anesthesia Plan: General   Post-op Pain Management:    Induction: Intravenous  Airway Management Planned: Oral ETT and LMA  Additional Equipment:   Intra-op Plan:   Post-operative Plan: Extubation in OR  Informed Consent: I have reviewed the patients History and Physical, chart, labs and discussed the procedure including the risks, benefits and alternatives for the proposed anesthesia with the patient or authorized representative who has indicated his/her understanding and acceptance.   Dental advisory given  Plan Discussed with: CRNA  Anesthesia Plan Comments:         Anesthesia Quick Evaluation

## 2014-08-28 NOTE — H&P (Signed)
Reason For Visit   Mr Minerva returns today because of some progressive and ongoing voiding issues. Much of his prior history is summarized in the section below. He has had very longstanding voiding issues and has been documented on multiple occasions not to empty his bladder. He also has a family history of prostate cancer. When we last assessed him, his PSA was normal at 1.8 when that was checked about 18 months ago. More recently, a PSA through his primary care physician was 5.0. Mr Carchi has also continued to have progressive and worsening voiding symptoms, and things have really gotten bad the last several months. He has nocturia 3-4 times per evening. He has gotten a really weak stream with hesitancy and incomplete bladder emptying. He has also complained of ongoing suprapubic and pelvic discomfort. He has been treated for presumptive prostatitis, although there has been no objective evidence that he has had a true infection. He was in to see one of our nurse practitioners recently and was put on Rapaflo as opposed to Flomax without any real improvement. Urine was clear at that time and is also clear today. His postvoid residual last time he was here was 250 mL and is fairly consistent today at 240 mL. He has fairly severe obstructive and irritative voiding symptoms along with the significant pelvic discomfort and now a PSA that is substantially higher than 18 months ago. On his PSA testing, the PSA2 is also 15%.      History of Present Illness       Mr. Mccullars presented in December of 2014 as a referral from [place] for further assessment of some questionable prostatitis along with longstanding voiding complaints, pelvic and suprapubic pain. Mr. Martel is 74 years of age. He tells me he on and off had issues with his prostate over the last 30-40 years. He reports being in the [employer] and having exposure to [employer]. He has had some longstanding voiding symptoms but  did get better initially with Flomax. More recently he does not feel like that medicine helps at all. He complains of urinary frequency with some urgency, nocturia 1 or 2 times per evening, severe hesitancy, weak stream, incomplete emptying, and generalized pelvic and suprapubic discomfort. He does not really have dysuria per se. Prostate cancer in a first cousin. He reports a brother and father have had prostate issues, but the specifics are unknown. He had a cardiac stent placed approximately 10-15 years ago but has not had any type of recent cardiac reassessment. He was unaware of any PSA testing.    We decided to changes alpha-blocker to Rapaflo and set him up for urodynamic testing. PSA was found to be normal at 1.8.    On urodynamics the patient's initial postvoid residual was 300 mL. The patient had a maximum capacity of close to 1000 mL. Bladder was hypersensitive with a first sensation at 845 mL. There was no evidence of instability. The patient was able to generate a detrusor contraction. He voided 265 mL with a maximum flow of 9 mL/s and a detrusor pressure at maximum flow of 32 cm of water pressure. Postvoid residual was around 700 cc. Overall flow was interrupted and appeared obstructive. Pressure flow nomograms put him in an equivocal category. It appears he probably has some long-standing outlet obstruction from BPH coupled with a hypotonic bladder.      The patient elected ongoing observation. He came in recently and saw Diane. He complained of increased difficulty  emptying his bladder. A Foley catheter was instilled and about a 350 cc residual was obtained. Patient removed his Foley catheter this morning. She stopped his tamsulosin and began Rapaflo. He is voiding status post Foley removal and is gone 3-4 times. He continues to complain of moderate hesitancy with weak stream. Postvoid residual currently 119 mL.   Past Medical History Problems  1. History of cardiac disorder  (Z86.79) 2. History of hypercholesterolemia (Z86.39)  Surgical History Problems  1. History of Cath Stent Placement  Current Meds 1. Tamsulosin HCl - 0.4 MG Oral Capsule; TAKE 2 CAPSULES BY MOUTH EVERY DAY;  Therapy: 28Sep2015 to (Evaluate:25Apr2016)  Requested for: 28Sep2015; Last  Rx:28Sep2015 Ordered  Allergies Medication  1. No Known Drug Allergies  Family History Problems  1. No pertinent family history 2. No pertinent family history : Mother  Social History Problems  1. Denied: History of Alcohol use 2. Caffeine use (F15.90) 3. Married 4. Never a smoker  Review of Systems  Genitourinary: urinary frequency, feelings of urinary urgency, nocturia, difficulty starting the urinary stream, weak urinary stream, urinary stream starts and stops, incomplete emptying of bladder, pelvic pain and initiating urination requires straining, but no hematuria and no erectile dysfunction.  Gastrointestinal: constipation.  Musculoskeletal: back pain and joint pain.    Vitals Vital Signs [Data Includes: Last 1 Day]  Recorded: 06Jun2016 09:59AM  Blood Pressure: 128 / 71 Temperature: 98 F Heart Rate: 54  Physical Exam Constitutional: Well nourished and well developed . No acute distress.  ENT:. The ears and nose are normal in appearance.  Pulmonary: No respiratory distress and normal respiratory rhythm and effort.  Cardiovascular: Heart rate and rhythm are normal . No peripheral edema.  Rectal: Rectal exam demonstrates normal sphincter tone, no tenderness and no masses. Estimated prostate size is 2+. The prostate has no nodularity, is indurated involving the left, apex of the prostate which appears to be confined within the prostate capsule and is tender. The left seminal vesicle is nonpalpable. The right seminal vesicle is nonpalpable. The perineum is normal on inspection.  Genitourinary: Examination of the penis demonstrates no discharge, no masses, no lesions and a normal meatus. The  scrotum is without lesions. The right epididymis is palpably normal and non-tender. The left epididymis is palpably normal and non-tender. The right testis is non-tender and without masses. The left testis is non-tender and without masses.    Results/Data Urine [Data Includes: Last 1 Day]   [id number]  COLOR YELLOW   APPEARANCE CLEAR   SPECIFIC GRAVITY 1.015   pH 6.0   GLUCOSE NEG mg/dL  BILIRUBIN NEG   KETONE NEG mg/dL  BLOOD NEG   PROTEIN NEG mg/dL  UROBILINOGEN 0.2 mg/dL  NITRITE NEG   LEUKOCYTE ESTERASE NEG    PVR: Ultrasound PVR 240 ml.    Assessment Assessed  1. Benign prostatic hyperplasia with urinary obstruction (N40.1,N13.8) 2. Neurogenic bladder (N31.9) 3. Incomplete bladder emptying (R33.9) 4. Elevated prostate specific antigen (PSA) (R97.2) 5. Abdominal pain, suprapubic (R10.2)  Plan Abdominal pain, suprapubic  1. Start: TraMADol HCl - 50 MG Oral Tablet; TAKE 1 TABLET Every 6 hours PRN 2. Follow-up Schedule Surgery Office  Follow-up  Status: Hold For - Appointment   Requested for: [id number] Health Maintenance  3. UA With REFLEX; [Do Not Release]; Status:Complete;   Done: [id number] 09:08AM  Discussion/Summary   Mr Colomb has really struggled with his voiding status for quite some time now. Previous urodynamics were somewhat equivocal for obstruction, given what appeared to be  a weakened bladder detrusor. He clearly has had longstanding issues with emptying his bladder. I suspect he has a combination of some outlet obstruction along with a weakened bladder. I would be hopeful that TURP would improve his ability to empty the bladder and, therefore, decrease his nocturia and frequency. It is very hard to predict whether this will do anything for his chronic pelvic and suprapubic discomfort. Some of this may be related to bladder distention and may improve, but I am hesitant to promise Mr Venditto that this will get better. He also needs assessment for his increased PSA and  ongoing left-sided prostatic induration. He does have a family history of prostate cancer. I have recommended that we go ahead with an ultrasound and biopsy of his prostate at the same time of his TURP. We can start with the peripheral zone sampling and then put him in lithotomy position to do the TURP. We also could potentially do the biopsy in lithotomy and then proceed with the resection. I would anticipate an overnight stay in the hospital with probably 2-3 days of catheter drainage. He is interested in proceeding, since he does not feel like his situation is getting any better and, in fact, is worsening. Again, full informed consent obtained.     Signatures Electronically signed by : Rana Snare, M.D.; Aug 05 2014  5:11PM EST

## 2014-08-28 NOTE — Anesthesia Postprocedure Evaluation (Signed)
  Anesthesia Post-op Note  Patient: Keith Olson.  Procedure(s) Performed: Procedure(s) (LRB): TRANSURETHRAL RESECTION OF THE PROSTATE WITH GYRUS INSTRUMENTS (N/A) BIOPSY TRANSRECTAL ULTRASONIC PROSTATE (TUBP) (N/A)  Patient Location: PACU  Anesthesia Type: General  Level of Consciousness: awake and alert   Airway and Oxygen Therapy: Patient Spontanous Breathing  Post-op Pain: mild  Post-op Assessment: Post-op Vital signs reviewed, Patient's Cardiovascular Status Stable, Respiratory Function Stable, Patent Airway and No signs of Nausea or vomiting  Last Vitals:  Filed Vitals:   08/28/14 1413  BP: 111/51  Pulse: 56  Temp: 36.4 C  Resp: 13    Post-op Vital Signs: stable   Complications: No apparent anesthesia complications

## 2014-08-28 NOTE — Progress Notes (Signed)
Pt c/o constant bladder pressure, and feeling the urge to urinate. Pt educated on effects of CBI. Pt was also offered a B&O suppository for bladder spasms, offered multiple times. Pt declined suppository multiple time. Pt told to call if he decides he wants it. Will continue to medicate when able with PRN pain meds.   Othella Boyer [facility] 08/28/2014

## 2014-08-28 NOTE — Anesthesia Procedure Notes (Signed)
Procedure Name: LMA Insertion Date/Time: 08/28/2014 8:35 AM Performed by: Carleene Cooper A Pre-anesthesia Checklist: Patient identified, Timeout performed, Emergency Drugs available, Suction available and Patient being monitored Patient Re-evaluated:Patient Re-evaluated prior to inductionOxygen Delivery Method: Circle system utilized Preoxygenation: Pre-oxygenation with 100% oxygen Intubation Type: IV induction Ventilation: Mask ventilation without difficulty LMA: LMA with gastric port inserted Number of attempts: 1 Placement Confirmation: positive ETCO2 and breath sounds checked- equal and bilateral Tube secured with: Tape Dental Injury: Teeth and Oropharynx as per pre-operative assessment

## 2014-08-28 NOTE — OR Nursing (Signed)
Pt describes CP sternal no radiation, tightness, no sob, no n/v. EKG done/ NTG given, Dr Janeice Robinson at bedside.  Pt further elaborates CP intermittent with Stress-- not with running. He reports extreme stress at home with heroin addict children- and raising his grandchildren due to this.  Goes on and on about the stress he is under and the situation at home. Reports does take occ NTG and or anxiety meds at home.

## 2014-08-28 NOTE — Op Note (Signed)
Preoperative diagnosis: BPH, elevated PSA Postoperative diagnosis: Same  Procedure: Transrectal ultrasound of the prostate with ultrasound-guided biopsy, cystoscopy, TURP gyrus bipolar   Surgeon: Bernestine Amass M.D.  Anesthesia: Gen.  Indications: Keith Olson is 74 years of age. He has had some long-standing progressive and ongoing voiding issues. He has also been noted to have an elevated PSA. He has continued to have fairly severe obstructive and irritative voiding symptoms with a documented postvoid residual of approximately 250 mL. We discussed multiple options with him. He wanted to proceed with TURP. Because of his elevated PSA we felt it would be reasonable to also perform concurrent transrectal biopsies of the prostate. Full informed consent has been obtained.     Technique and findings: Patient was brought the operating room where he had successful induction of general anesthesia. He was placed in lithotomy position and prepped and draped in usual manner. He received perioperative Rocephin in addition to oral Levaquin.   Transrectal ultrasound was performed initially. Representative sagittal and transverse images of the prostate were taken. There were no obvious hypoechoic areas within the prostate. Capsular perimeter appeared to be intact and symmetric. Prostate volume was estimated at approximately 26 g. The prostate was homogenous without lesions on ultrasound. The seminal vesicles appeared normal. No median lobe was present. The biopsy cores were obtained using direct, real-time ultrasound guidance utilizing a standard 12-core pattern with one core from the right apex lateral using real time ultrasound guidance to direct the biopsy core being taken from this location, right apex medial using real time ultrasound guidance to direct the biopsy core being taken from this location, left apex lateral using real time ultrasound guidance to direct the biopsy core being taken from this location, left  apex medial using real time ultrasound guidance to direct the biopsy core being taken from this location, right mid lateral using real time ultrasound guidance to direct the biopsy core being taken from this location, right mid medial using real time ultrasound guidance to direct the biopsy core being taken from this location, left mid lateral using real time ultrasound guidance to direct the biopsy core being taken from this location, left mid medial using real time ultrasound guidance to direct the biopsy core being taken from this location, right base lateral using real time ultrasound guidance to direct the biopsy core being taken from this location, right base medial using real time ultrasound guidance to direct the biopsy core being taken from this location, left base lateral using real time ultrasound guidance to direct the biopsy core being taken from this location and left base medial of the prostate using real time ultrasound guidance to direct the biopsy core being taken from this location. The biopsy cores were placed in buffered formalin and sent to pathlogy.   Attention was then turned towards TURP. The patient's was reprepped and draped.  42 French resectoscope was inserted with the visual obturator. The patient had trilobar hyperplasia with a fairly prominent median bar. The bladder itself showed no pathology. There was mild to moderate trabeculation. Resection was performed with saline irrigation. Gyrus instrumentation was utilized. The median bar was taken down to capsular fibers. Lateral lobe tissue was then taken bilaterally anteriorly to posteriorly out to the vera montanum. At the completion of the procedure visual obstruction had resolved. Prostate chips were sent for permanent pathologic analysis. Hemostasis was quite good with very clear to light pink urine. A 22 French continuous flow catheter was inserted. The patient was brought to PACU  in stable condition having had no obvious  complications or problems.

## 2014-08-28 NOTE — Interval H&P Note (Signed)
History and Physical Interval Note:  08/28/2014 8:17 AM  Keith Olson.  has presented today for surgery, with the diagnosis of BENIGN PROSTATIC HYPERPLASIA, ELEVATED PROSTATIC SPECIFIC ANTIGEN  The various methods of treatment have been discussed with the patient and family. After consideration of risks, benefits and other options for treatment, the patient has consented to  Procedure(s): TRANSURETHRAL RESECTION OF THE PROSTATE WITH GYRUS INSTRUMENTS (N/A) BIOPSY TRANSRECTAL ULTRASONIC PROSTATE (TUBP) (N/A) as a surgical intervention .  The patient's history has been reviewed, patient examined, no change in status, stable for surgery.  I have reviewed the patient's chart and labs.  Questions were answered to the patient's satisfaction.     Kendle Erker S

## 2014-08-29 ENCOUNTER — Encounter (HOSPITAL_COMMUNITY): Payer: Self-pay | Admitting: Urology

## 2014-08-29 DIAGNOSIS — R3914 Feeling of incomplete bladder emptying: Secondary | ICD-10-CM | POA: Diagnosis not present

## 2014-08-29 DIAGNOSIS — R350 Frequency of micturition: Secondary | ICD-10-CM | POA: Diagnosis not present

## 2014-08-29 DIAGNOSIS — C61 Malignant neoplasm of prostate: Secondary | ICD-10-CM | POA: Diagnosis not present

## 2014-08-29 DIAGNOSIS — N401 Enlarged prostate with lower urinary tract symptoms: Secondary | ICD-10-CM | POA: Diagnosis not present

## 2014-08-29 DIAGNOSIS — R351 Nocturia: Secondary | ICD-10-CM | POA: Diagnosis not present

## 2014-08-29 DIAGNOSIS — E780 Pure hypercholesterolemia: Secondary | ICD-10-CM | POA: Diagnosis not present

## 2014-08-29 DIAGNOSIS — Z8042 Family history of malignant neoplasm of prostate: Secondary | ICD-10-CM | POA: Diagnosis not present

## 2014-08-29 DIAGNOSIS — R3911 Hesitancy of micturition: Secondary | ICD-10-CM | POA: Diagnosis not present

## 2014-08-29 DIAGNOSIS — R3912 Poor urinary stream: Secondary | ICD-10-CM | POA: Diagnosis not present

## 2014-08-29 LAB — BASIC METABOLIC PANEL
Anion gap: 7 (ref 5–15)
BUN: 6 mg/dL (ref 6–20)
CO2: 24 mmol/L (ref 22–32)
Calcium: 8.3 mg/dL — ABNORMAL LOW (ref 8.9–10.3)
Chloride: 109 mmol/L (ref 101–111)
Creatinine, Ser: 0.72 mg/dL (ref 0.61–1.24)
GFR calc Af Amer: >60 mL/min (ref >60)
GFR calc non Af Amer: >60 mL/min (ref >60)
Glucose, Bld: 87 mg/dL (ref 65–99)
Potassium: 4.4 mmol/L (ref 3.5–5.1)
Sodium: 140 mmol/L (ref 135–145)

## 2014-08-29 LAB — HEMOGLOBIN AND HEMATOCRIT, BLOOD
HCT: 37.6 % — ABNORMAL LOW (ref 39.0–52.0)
Hemoglobin: 12.8 g/dL — ABNORMAL LOW (ref 13.0–17.0)

## 2014-08-29 MED ORDER — OXYCODONE-ACETAMINOPHEN 5-325 MG PO TABS: 1.0000 | ORAL_TABLET | ORAL | Status: DC | PRN

## 2014-08-29 NOTE — Discharge Summary (Signed)
Patient ID: Keith Olson. MRN: [id number] DOB/AGE: Dec 13, 1940 74 y.o.  Admit date: 08/28/2014 Discharge date: 08/29/2014    Discharge Diagnoses:   Present on Admission:  . BPH with urinary obstruction  Consults:  None    Discharge Medications:   Medication List    STOP taking these medications        aspirin EC 325 MG tablet      TAKE these medications        ALPRAZolam 1 MG tablet  Commonly known as:  XANAX  Take 1 tablet (1 mg total) by mouth 2 (two) times daily as needed for anxiety.     fluticasone 50 MCG/ACT nasal spray  Commonly known as:  FLONASE  Place 2 sprays into both nostrils daily.     levofloxacin 500 MG tablet  Commonly known as:  LEVAQUIN  Take 500 mg by mouth daily. Patient to take one day before procedure, one morning of procedure and one night after procedure     meloxicam 15 MG tablet  Commonly known as:  MOBIC  Take 1 tablet (15 mg total) by mouth daily.     oxyCODONE-acetaminophen 5-325 MG per tablet  Commonly known as:  PERCOCET/ROXICET  Take 1-2 tablets by mouth every 4 (four) hours as needed for moderate pain.     rosuvastatin 10 MG tablet  Commonly known as:  CRESTOR  Take 1 tablet (10 mg total) by mouth daily.     tamsulosin 0.4 MG Caps capsule  Commonly known as:  FLOMAX  TAKE TWO CAPSULES BY MOUTH ONCE DAILY     traMADol 50 MG tablet  Commonly known as:  ULTRAM  Take 1 tablet (50 mg total) by mouth every 8 (eight) hours as needed.         Significant Diagnostic Studies:  No results found.    Hospital Course:  Active Problems:   BPH with urinary obstruction patient underwent transrectal ultrasound prostate with ultrasound-guided biopsy secondary to elevated PSA.  This was done in addition to sampling of the tissue with TURP to better sample the peripheral zone of the prostate.  After prostate biopsy the patient underwent an uneventful TURP secondary to long-standing progressive voiding symptoms.  Patient was kept  overnight for observation.  Urine was relatively clear to light pink in the morning.  The patient will be discharged home with an indwelling catheter with plans for voiding trial as an outpatient.  Day of Discharge BP 100/58 mmHg  Pulse 63  Temp(Src) 97.9 F (36.6 C) (Oral)  Resp 17  Ht 5' 7.5" (1.715 m)  Wt 67.3 kg (148 lb 5.9 oz)  BMI 22.88 kg/m2  SpO2 100%   Well-developed well-nourished male in no acute distress. Respiratory effort normal Abdomen soft and nontender Genitourinary: Indwelling Foley catheter no concerning findings noted Extremities: No edema/tenderness   Results for orders placed or performed during the hospital encounter of 08/28/14 (from the past 24 hour(s))  CK total and CKMB (cardiac)not at [facility]     Status: None   Collection Time: 08/28/14 11:30 AM  Result Value Ref Range   Total CK 87 49 - 397 U/L   CK, MB 3.3 0.5 - 5.0 ng/mL   Relative Index RELATIVE INDEX IS INVALID 0.0 - 2.5  Troponin I     Status: None   Collection Time: 08/28/14 11:30 AM  Result Value Ref Range   Troponin I <0.03 <0.031 ng/mL  Basic metabolic panel     Status: Abnormal   Collection Time: 08/29/14  4:05 AM  Result Value Ref Range   Sodium 140 135 - 145 mmol/L   Potassium 4.4 3.5 - 5.1 mmol/L   Chloride 109 101 - 111 mmol/L   CO2 24 22 - 32 mmol/L   Glucose, Bld 87 65 - 99 mg/dL   BUN 6 6 - 20 mg/dL   Creatinine, Ser 0.72 0.61 - 1.24 mg/dL   Calcium 8.3 (L) 8.9 - 10.3 mg/dL   GFR calc non Af Amer >60 >60 mL/min   GFR calc Af Amer >60 >60 mL/min   Anion gap 7 5 - 15  Hemoglobin and hematocrit, blood     Status: Abnormal   Collection Time: 08/29/14  4:05 AM  Result Value Ref Range   Hemoglobin 12.8 (L) 13.0 - 17.0 g/dL   HCT 37.6 (L) 39.0 - 52.0 %

## 2014-08-29 NOTE — Discharge Instructions (Signed)
Post transurethral resection of the prostate (TURP) instructions ° °Your recent prostate surgery requires very special post hospital care. Despite the fact that no skin incisions were used the area around the prostate incision is quite raw and is covered with a scab to promote healing and prevent bleeding. Certain cautions are needed to assure that the scab is not disturbed of the next 2-3 weeks while the healing proceeds. ° °Because the raw surface in your prostate and the irritating effects of urine you may expect frequency of urination and/or urgency (a stronger desire to urinate) and perhaps even getting up at night more often. This will usually resolve or improve slowly over the healing period. You may see some blood in your urine over the first 6 weeks. Do not be alarmed, even if the urine was clear for a while. Get off your feet and drink lots of fluids until clearing occurs. If you start to pass clots or don't improve call us. ° °Catheter: (If you are discharged with a catheter.) °1. Keep your catheter secured to your leg at all times with tape or the supplied strap. °2. You may experience leakage of urine around your catheter- as long as the  °catheter continues to drain, this is normal.  If your catheter stops draining  °go to the ER. °3. You may also have blood in your urine, even after it has been clear for  °several days; you may even pass some small blood clots or other material.  This  °is normal as well.  If this happens, sit down and drink plenty of water to help  °make urine to flush out your bladder.  If the blood in your urine becomes worse  °after doing this, contact our office or return to the ER. °4. You may use the leg bag (small bag) during the day, but use the large bag at  °night. ° °Diet: ° °You may return to your normal diet immediately. Because of the raw surface of your bladder, alcohol, spicy foods, foods high in acid and drinks with caffeine may cause irritation or frequency and  should be used in moderation. To keep your urine flowing freely and avoid constipation, drink plenty of fluids during the day (8-10 glasses). Tip: Avoid cranberry juice because it is very acidic. ° °Activity: ° °Your physical activity doesn't need to be restricted. However, if you are very active, you may see some blood in the urine. We suggest that you reduce your activity under the circumstances until the bleeding has stopped. ° °Bowels: ° °It is important to keep your bowels regular during the postoperative period. Straining with bowel movements can cause bleeding. A bowel movement every other day is reasonable. Use a mild laxative if needed, such as milk of magnesia 2-3 tablespoons, or 2 Dulcolax tablets. Call if you continue to have problems. If you had been taking narcotics for pain, before, during or after your surgery, you may be constipated. Take a laxative if necessary. ° °Medication: ° °You should resume your pre-surgery medications unless told not to. DO NOT RESUME YOUR ASPIRIN, WARFARIN, OR OTHER BLOOD THINNER FOR 1 WEEK. In addition you may be given an antibiotic to prevent or treat infection. Antibiotics are not always necessary. All medication should be taken as prescribed until the bottles are finished unless you are having an unusual reaction to one of the drugs. ° ° ° ° °Problems you should report to us: ° °a. Fever greater than 101°F. °b. Heavy bleeding, or clots (see   notes above about blood in urine). c. Inability to urinate. d. Drug reactions (hives, rash, nausea, vomiting, diarrhea). e. Severe burning or pain with urination that is not improving.  Keep appointment for 7-1 for catheter removal

## 2014-08-30 DIAGNOSIS — R339 Retention of urine, unspecified: Secondary | ICD-10-CM | POA: Diagnosis not present

## 2014-08-31 DIAGNOSIS — R338 Other retention of urine: Secondary | ICD-10-CM | POA: Diagnosis not present

## 2014-08-31 DIAGNOSIS — T83098A Other mechanical complication of other indwelling urethral catheter, initial encounter: Secondary | ICD-10-CM | POA: Diagnosis not present

## 2014-08-31 DIAGNOSIS — Z9889 Other specified postprocedural states: Secondary | ICD-10-CM | POA: Diagnosis not present

## 2014-08-31 DIAGNOSIS — R339 Retention of urine, unspecified: Secondary | ICD-10-CM | POA: Diagnosis not present

## 2014-09-11 ENCOUNTER — Other Ambulatory Visit (HOSPITAL_COMMUNITY): Payer: Self-pay | Admitting: Urology

## 2014-09-11 DIAGNOSIS — C61 Malignant neoplasm of prostate: Secondary | ICD-10-CM

## 2014-09-17 ENCOUNTER — Encounter: Payer: Self-pay | Admitting: Radiation Oncology

## 2014-09-17 NOTE — Progress Notes (Signed)
GU Location of Tumor / Histology: prostatic adenocarcinoma   If Prostate Cancer, Gleason Score is (4 + 4) and PSA is (5.0)  Keith Olson. presented December 2014 with questionable prostatitis along with some longstanding voiding symptoms and chronic pelvic and supra pubic discomfort but, PSA was normal at 1.8.  Surgical pathology revealed large volume disease: 1. Prostate, needle biopsy(ies), right base lateral - MICROSCOPIC FOCUS OF PROSTATIC ADENOCARCINOMA, GLEASON SCORE 4+3=7, (WHO GROUP 3) INVOLVING LESS THAN 2% OF ONE CORE. 2. Prostate, needle biopsy(ies), right base medial - PROSTATIC ADENOCARCINOMA, GLEASON SCORE 4+3=7 (WHO GROUP 3) INVOLVING 20% OF ONE CORE. 3. Prostate, needle biopsy(ies), right mid lateral - PROSTATIC ADENOCARCINOMA, GLEASON SCORE 4+3=7,(WHO GROUP 3) INVOLVING 5% OF ONE CORE. 4. Prostate, needle biopsy(ies), right mid medial - PROSTATIC ADENOCARCINOMA, GLEASON SCORE 4+3=7 (WHO GROUP 3) INVOLVING 60% OF ONE CORE. 5. Prostate, needle biopsy(ies), right apex lateral - PROSTATIC ADENOCARCINOMA, GLEASON SCORE 4+4=7 (WHO GROUP 3) INVOLVING 90% OF ONE CORE. PERINEURAL INVASION PRESENT. 6. Prostate, needle biopsy(ies), right apex medial - PROSTATIC ADENOCARCINOMA, GLEASON SCORE 4+3=7 (WHO GROUP 3) INVOLVING 10% OF ONE CORE. 7. Prostate, needle biopsy(ies), left base lateral - PROSTATIC ADENOCARCINOMA, GLEASON SCORE 4+4=8 (WHO GROUP 4) INVOLVING 90% OF ONE CORE. PERINEURAL INVASION PRESENT. 8. Prostate, needle biopsy(ies), left base medial - PROSTATIC ADENOCARCINOMA, GLEASON SCORE 4+4=7 (WHO GROUP 3) INVOLVING 90% OF ONE CORE. PERINEURAL INVASION PRESENT. 9. Prostate, needle biopsy(ies), left mid lateral - PROSTATIC ADENOCARCINOMA, GLEASON SCORE 4+4=8 (WHO GROUP 4) INVOLVING 90% OF ONE CORE. PERINEURAL INVASION PRESENT. 10. Prostate, needle biopsy(ies), left mid medial - PROSTATIC ADENOCARCINOMA, GLEASON SCORE 4+4=8 (WHO GROUP 4) INVOLVING 80% OF ONE CORE. 11.  Prostate, needle biopsy(ies), left apex lateral - PROSTATIC ADENOCARCINOMA, GLEASON SCORE 4+3=7 (WHO GROUP 3) INVOLVING 90% OF ONE CORE. PERINEURAL INVASION PRESENT. 12. Prostate, needle biopsy(ies), left apex medial 1 of 3 FINAL for Keith Olson, Keith Olson. [phone]) Diagnosis(continued) - PROSTATIC ADENOCARCINOMA, GLEASON SCORE 4+3=7 (WHO GROUP 3) INVOLVING 20% OF ONE CORE. PERINEURAL INVASION PRESENT. 13. Prostate, chips - PROSTATIC ADENOCARCINOMA, GLEASON SCORE 4+3=7 (WHO GROUP 3) INVOLVING 15% OF THE TISSUE.  Past/Anticipated interventions by urology, if any: limited TURP along with transrectal ultrasound and biopsy, referral to radiation oncology, schedule bone scan, discussed with patient 24 months of androgen deprivation along with external beam radiation therapy  Past/Anticipated interventions by medical oncology, if any: no  Weight changes, if any: no  Bowel/Bladder complaints, if any: urinary frequency, urgency, dysuria, nocturia x 2, difficulty starting the urinary stream, weak urine stream, intermittent stream, hematuria, straining to initiate void but, no ED   Nausea/Vomiting, if any: no  Pain issues, if any:  Low back and pelvic pain  SAFETY ISSUES:  Prior radiation? no  Pacemaker/ICD? no  Possible current pregnancy? no  Is the patient on methotrexate? no  Current Complaints / other details:  75 year old male. Married. Family hx of prostate cancer. NKDA. Prostate volume 26 grams. Bone scan scheduled for 09/24/2014.

## 2014-09-18 ENCOUNTER — Ambulatory Visit: Payer: Medicare Other

## 2014-09-18 ENCOUNTER — Ambulatory Visit: Payer: Medicare Other | Admitting: Radiation Oncology

## 2014-09-19 ENCOUNTER — Ambulatory Visit
Admission: RE | Admit: 2014-09-19 | Discharge: 2014-09-19 | Disposition: A | Discharge status: Home / Self Care | Payer: Medicare Other | Source: Ambulatory Visit | Attending: Radiation Oncology | Admitting: Radiation Oncology

## 2014-09-19 ENCOUNTER — Encounter: Payer: Self-pay | Admitting: Radiation Oncology

## 2014-09-19 VITALS — BP 139/72 | HR 50 | Resp 16 | Ht 68.0 in | Wt 149.7 lb

## 2014-09-19 DIAGNOSIS — Z87891 Personal history of nicotine dependence: Secondary | ICD-10-CM | POA: Diagnosis not present

## 2014-09-19 DIAGNOSIS — C7951 Secondary malignant neoplasm of bone: Secondary | ICD-10-CM | POA: Insufficient documentation

## 2014-09-19 DIAGNOSIS — Z510 Encounter for antineoplastic radiation therapy: Secondary | ICD-10-CM | POA: Diagnosis not present

## 2014-09-19 DIAGNOSIS — C61 Malignant neoplasm of prostate: Secondary | ICD-10-CM

## 2014-09-19 NOTE — Progress Notes (Signed)
Radiation Oncology         [phone]) [phone] ________________________________  Initial outpatient Consultation  Name: Keith Olson. MRN: [id number]  Date: 09/19/2014  DOB: 1940-09-11  PN:TIRW, Tiffany, PA-C  Rana Snare, MD   REFERRING PHYSICIAN: Rana Snare, MD  DIAGNOSIS: 74 y.o. 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 Malignant neoplasm of prostate [place] OF PRESENT ILLNESS::Keith Olson. is a 74 y.o. gentleman.  He was noted to have an elevated PSA of 5.0 by his primary care physician, Dr. Laurance Olson.  Accordingly, he was referred for evaluation in urology by Dr. Risa Grill on 08/05/14,  digital rectal examination was performed at that time revealing a 2+ gland which was inderrated along the left apex.  The patient proceeded to staged TURP pre seeded by transrectal ultrasound with 12 biopsies of the prostate.  The prostate volume measured 26 cc.  Out of 12 core biopsies,12 were positive, and the TURP was 15% positive.  The maximum Gleason score was 4+4, and this was seen in the left lateral base, left lateral mid, and left medial mid. The other specimens showed 4+3.   The patient reviewed the biopsy results with his urologist and he has kindly been referred today for discussion of potential radiation treatment options.  PREVIOUS RADIATION THERAPY: No  PAST MEDICAL HISTORY:  has a past medical history of Hyperlipidemia; Enlarged prostate; Arthritis; Coronary artery disease; Agent orange exposure; Depression; PTSD (post-traumatic stress disorder); Headache; and Prostate cancer.    PAST SURGICAL HISTORY: Past Surgical History  Procedure Laterality Date  . Coronary stents     . Hernia repair      right inguinal hernia repair   . Transurethral resection of prostate N/A 08/28/2014    Procedure: TRANSURETHRAL RESECTION OF THE PROSTATE WITH GYRUS INSTRUMENTS;  Surgeon: Rana Snare, MD;   Location: [facility];  Service: Urology;  Laterality: N/A;  . Prostate biopsy N/A 08/28/2014    Procedure: BIOPSY TRANSRECTAL ULTRASONIC PROSTATE (TUBP);  Surgeon: Rana Snare, MD;  Location: [facility];  Service: Urology;  Laterality: N/A;    FAMILY HISTORY: family history includes Arthritis in his mother.  SOCIAL HISTORY:  reports that he has never smoked. He has quit using smokeless tobacco. His smokeless tobacco use included Chew. He reports that he does not drink alcohol or use illicit drugs.  ALLERGIES: Lipitor  MEDICATIONS:  Current Outpatient Prescriptions  Medication Sig Dispense Refill  . ALPRAZolam (XANAX) 1 MG tablet Take 1 tablet (1 mg total) by mouth 2 (two) times daily as needed for anxiety. 60 tablet 3  . oxyCODONE-acetaminophen (PERCOCET/ROXICET) 5-325 MG per tablet Take 1-2 tablets by mouth every 4 (four) hours as needed for moderate pain. 30 tablet 0  . rosuvastatin (CRESTOR) 10 MG tablet Take 1 tablet (10 mg total) by mouth daily. 30 tablet 11  . fluticasone (FLONASE) 50 MCG/ACT nasal spray Place 2 sprays into both nostrils daily. (Patient not taking: Reported on 09/19/2014) 16 g 6  . HYDROcodone-acetaminophen (NORCO/VICODIN) 5-325 MG per tablet     . meloxicam (MOBIC) 15 MG tablet Take 1 tablet (15 mg total) by mouth daily. (Patient not taking: Reported on 09/19/2014) 30 tablet 1  . tamsulosin (FLOMAX) 0.4 MG CAPS capsule TAKE TWO CAPSULES BY MOUTH ONCE DAILY (Patient not taking: Reported on 09/19/2014) 60 capsule 1  . traMADol (ULTRAM) 50 MG tablet Take 1 tablet (  50 mg total) by mouth every 8 (eight) hours as needed. (Patient not taking: Reported on 09/19/2014) 90 tablet 0   No current facility-administered medications for this encounter.    REVIEW OF SYSTEMS:  A 15 point review of systems is documented in the electronic medical record. This was obtained by the nursing staff. However, I reviewed this with the patient to discuss relevant findings and make appropriate changes.  A  comprehensive review of systems was negative..  The patient completed an IPSS and IIEF questionnaire.    Patient reports dysuria, this pain was present before TURP and is now worse. Reports pain in lower back and lower abdomen.    PHYSICAL EXAM: This patient is in no acute distress.  He is alert and oriented.   height is '5\' 8"'$  (1.727 m) and weight is 149 lb 11.2 oz (67.903 kg). His blood pressure is 139/72 and his pulse is 50. His respiration is 16 and oxygen saturation is 100%.  He exhibits no respiratory distress or labored breathing.  He appears neurologically intact.  His mood is pleasant.  His affect is appropriate.  Please note the digital rectal exam findings described above.  KPS = 100  100 - Normal; no complaints; no evidence of disease. 90   - Able to carry on normal activity; minor signs or symptoms of disease. 80   - Normal activity with effort; some signs or symptoms of disease. 48   - Cares for self; unable to carry on normal activity or to do active work. 60   - Requires occasional assistance, but is able to care for most of his personal needs. 50   - Requires considerable assistance and frequent medical care. 11   - Disabled; requires special care and assistance. 26   - Severely disabled; hospital admission is indicated although death not imminent. 21   - Very sick; hospital admission necessary; active supportive treatment necessary. 10   - Moribund; fatal processes progressing rapidly. 0     - Dead  Karnofsky DA, Abelmann Nicholson, Craver LS and Burchenal [facility] [phone]) The use of the nitrogen mustards in the palliative treatment of carcinoma: with particular reference to bronchogenic carcinoma Cancer 1 634-56   LABORATORY DATA:  Lab Results  Component Value Date   WBC 6.1 08/26/2014   HGB 12.8* 08/29/2014   HCT 37.6* 08/29/2014   MCV 91.1 08/26/2014   PLT 193 08/26/2014   Lab Results  Component Value Date   NA 140 08/29/2014   K 4.4 08/29/2014   CL 109 08/29/2014   CO2 24  08/29/2014   Lab Results  Component Value Date   ALT 25 07/24/2014   AST 23 07/24/2014   ALKPHOS 69 07/24/2014   BILITOT 0.3 07/24/2014     RADIOGRAPHY: No results found.    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  His T-Stage, Gleason's Score, and PSA put him into the high risk group.  Accordingly he is eligible for a variety of potential treatment options including hormone therapy and radiotherapy. I do not recommend prostate seed implant due to his previous urinary problems and recent TURP.  PLAN: Today I reviewed the findings and workup thus far.  We discussed the natural history of prostate cancer.  We reviewed the the implications of T-stage, Gleason's Score, and PSA on decision-making and outcomes in prostate cancer.  We discussed radiation treatment in the management  of prostate cancer with regard to the logistics and delivery of external beam radiation treatment as well as the logistics and delivery of prostate brachytherapy.  We compared and contrasted each of these approaches and also compared these against prostatectomy.  The patient expressed interest in external beam radiotherapy.  I filled out a patient counseling form for him with relevant treatment diagrams and we retained a copy for our records.   The patient would like to proceed with prostate IMRT.  I will share my findings with Dr. Risa Grill and move forward with scheduling placement of three gold fiducial markers into the prostate to proceed with IMRT in the near future.     I enjoyed meeting with him today, and will look forward to participating in the care of this very nice gentleman.   I spent 60 minutes face to face with the patient and more than 50% of that time was spent in counseling and/or coordination of care.   This document serves as a record of services personally performed by Tyler Pita, MD. It was created on his  behalf by Arlyce Harman, a trained medical scribe. The creation of this record is based on the scribe's personal observations and the provider's statements to them. This document has been checked and approved by the attending provider.    ------------------------------------------------  Sheral Apley Tammi Klippel, M.D.

## 2014-09-19 NOTE — Progress Notes (Signed)
See progress note under physician encounter. 

## 2014-09-24 ENCOUNTER — Encounter (HOSPITAL_COMMUNITY)
Admission: RE | Admit: 2014-09-24 | Discharge: 2014-09-24 | Disposition: A | Discharge status: Home / Self Care | Payer: Medicare Other | Source: Ambulatory Visit | Attending: Urology | Admitting: Urology

## 2014-09-24 DIAGNOSIS — C61 Malignant neoplasm of prostate: Secondary | ICD-10-CM | POA: Insufficient documentation

## 2014-09-24 DIAGNOSIS — Z87891 Personal history of nicotine dependence: Secondary | ICD-10-CM | POA: Diagnosis not present

## 2014-09-24 DIAGNOSIS — Z510 Encounter for antineoplastic radiation therapy: Secondary | ICD-10-CM | POA: Diagnosis not present

## 2014-09-24 IMAGING — NM NM BONE WHOLE BODY
2 series · 2 of 2 positions shown · non-contrast
Comparison: None.

CLINICAL DATA: Prostate cancer. Elevated PSA. Low back and pelvic
pain.

EXAM:
NUCLEAR MEDICINE WHOLE BODY BONE SCAN
TECHNIQUE: Whole body anterior and posterior images were obtained approximately
3 hours after intravenous injection of radiopharmaceutical.
RADIOPHARMACEUTICALS:  25.8 mCi [JT] MDP IV

[Series 1: wbr_bone_40 whole body · 2.66mm/px · 1 of 1 slices shown (1 of 2)]
[im 1/1]
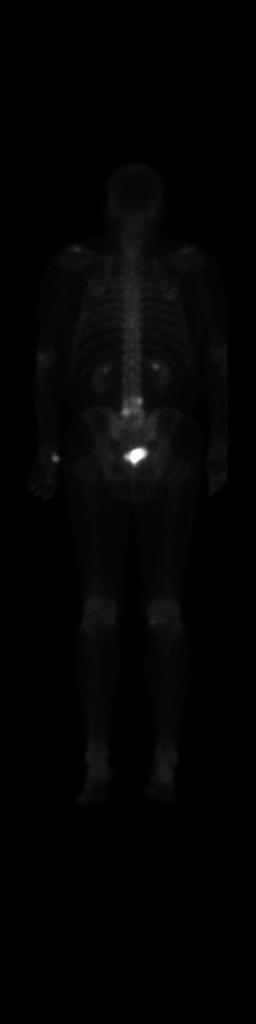

[Series 1: wbr_bone_40 whole body · 2.66mm/px · 1 of 1 slices shown (2 of 2)]
[im 1/1]
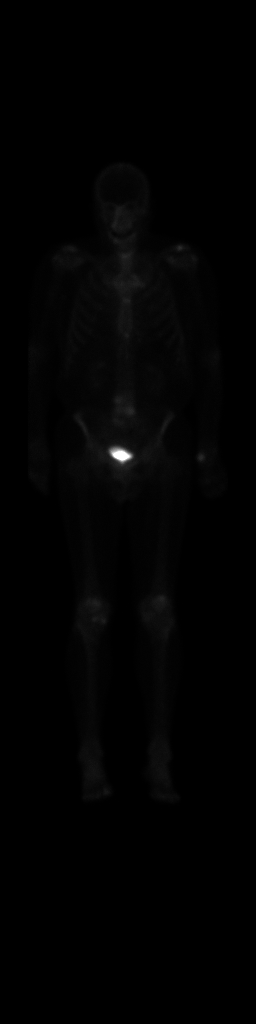

[2 of 2 positions shown; findings below may reference images not displayed]

FINDINGS: There are areas of increased activity noted laterally in the lower
lumbar spine, likely degenerative. Degenerative uptake in the right
wrist and left knee. No suspicious bony uptake to suggest osseous
metastatic disease.
IMPRESSION: Areas of degenerative uptake as above. No evidence of osseous
metastatic disease.

## 2014-09-24 MED ORDER — TECHNETIUM TC 99M MEDRONATE IV KIT
25.8000 | PACK | Freq: Once | INTRAVENOUS | Status: AC | PRN
  Administered 2014-09-24: 25.8 via INTRAVENOUS

## 2014-09-30 ENCOUNTER — Other Ambulatory Visit: Payer: Self-pay | Admitting: Family

## 2014-10-01 ENCOUNTER — Other Ambulatory Visit: Payer: Self-pay | Admitting: Family

## 2014-10-01 NOTE — Telephone Encounter (Signed)
Last seen 07/24/14 Keith Olson  If approved route to nurse to call into [facility]

## 2014-10-01 NOTE — Telephone Encounter (Signed)
Approved. Please call into pharmacy. Thanks,  Arnola Crittendon A. Benjamin Stain PA-C

## 2014-10-03 ENCOUNTER — Telehealth: Payer: Self-pay | Admitting: *Deleted

## 2014-10-03 NOTE — Telephone Encounter (Signed)
Called patient to inform that Dr. Cy Blamer schedule books quickly and the scheduler is speaking to his nurse, she will get back with me with an appt. Date and time, then I will book his sim, patient verified understanding this

## 2014-10-08 ENCOUNTER — Telehealth: Payer: Self-pay | Admitting: *Deleted

## 2014-10-08 NOTE — Telephone Encounter (Signed)
CALLED PATIENT TO INFORM OF GOLD SEED PLACEMENT ON 12-02-14- ARRIVAL TIME - 2:30 PM @ DR. GRAPEY'S OFFICE, I TOLD MR. Tropea THAT I WOULD TRY TO GET THIS APPT. MOVED UP, I WILL SCHEDULE HIS SIM AS SOON AS THIS HAPPENS, I INFORMED MR. Seman THAT I WILL UPDATE HIM

## 2014-10-08 NOTE — Telephone Encounter (Signed)
CALLED PATIENT TO INFORM OF GOLD SEED PLACEMENT ON 10/17/14- ARRIVAL TIME - 10:15 AM @ DR. GRAPEY'S OFFICE AND HIS SIM ON 10/24/14 @ 10 AM @ DR. MANNING'S OFFICE, SPOKE WITH PATIENT AND HE IS AWARE OF THESE APPTS.

## 2014-10-08 NOTE — Telephone Encounter (Signed)
XXXX 

## 2014-10-09 DIAGNOSIS — C61 Malignant neoplasm of prostate: Secondary | ICD-10-CM | POA: Diagnosis not present

## 2014-10-13 NOTE — Addendum Note (Signed)
Encounter addended by: Tyler Pita, MD on: 10/13/2014  5:16 PM<BR>     Documentation filed: Notes Section

## 2014-10-17 DIAGNOSIS — C61 Malignant neoplasm of prostate: Secondary | ICD-10-CM | POA: Diagnosis not present

## 2014-10-24 ENCOUNTER — Ambulatory Visit
Admission: RE | Admit: 2014-10-24 | Discharge: 2014-10-24 | Disposition: A | Discharge status: Home / Self Care | Payer: Medicare Other | Source: Ambulatory Visit | Attending: Radiation Oncology | Admitting: Radiation Oncology

## 2014-10-24 DIAGNOSIS — C61 Malignant neoplasm of prostate: Secondary | ICD-10-CM

## 2014-10-24 DIAGNOSIS — Z87891 Personal history of nicotine dependence: Secondary | ICD-10-CM | POA: Diagnosis not present

## 2014-10-24 DIAGNOSIS — Z510 Encounter for antineoplastic radiation therapy: Secondary | ICD-10-CM | POA: Diagnosis not present

## 2014-10-27 NOTE — Progress Notes (Signed)
  Radiation Oncology         [phone]) [phone] ________________________________  Name: Keith Olson. MRN: [id number]  Date: 10/24/2014  DOB: 1940-03-02  SIMULATION AND TREATMENT PLANNING NOTE    ICD-9-CM ICD-10-CM   1. Malignant neoplasm of prostate 185 C61     DIAGNOSIS:  75 y.o. 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  Identity was confirmed.  All relevant records and images related to the planned course of therapy were reviewed.  The patient freely provided informed written consent to proceed with treatment after reviewing the details related to the planned course of therapy. The consent form was witnessed and verified by the simulation staff.  Then, the patient was set-up in a stable reproducible supine position for radiation therapy.  A vacuum lock pillow device was custom fabricated to position his legs in a reproducible immobilized position.  Then, I performed a urethrogram under sterile conditions to identify the prostatic apex.  CT images were obtained.  Surface markings were placed.  The CT images were loaded into the planning software.  Then the prostate target and avoidance structures including the rectum, bladder, bowel and hips were contoured.  Treatment planning then occurred.  The radiation prescription was entered and confirmed.  A total of one complex treatment device was fabricated. I have requested : Intensity Modulated Radiotherapy (IMRT) is medically necessary for this case for the following reason:  Rectal sparing.Marland Kitchen  PLAN:  The patient will receive 78 Gy in 40 fractions.  ________________________________  Keith Olson, M.D.

## 2014-10-28 DIAGNOSIS — C61 Malignant neoplasm of prostate: Secondary | ICD-10-CM | POA: Diagnosis not present

## 2014-10-28 DIAGNOSIS — Z510 Encounter for antineoplastic radiation therapy: Secondary | ICD-10-CM | POA: Diagnosis not present

## 2014-10-28 DIAGNOSIS — Z87891 Personal history of nicotine dependence: Secondary | ICD-10-CM | POA: Diagnosis not present

## 2014-10-30 DIAGNOSIS — Z510 Encounter for antineoplastic radiation therapy: Secondary | ICD-10-CM | POA: Diagnosis not present

## 2014-10-30 DIAGNOSIS — C61 Malignant neoplasm of prostate: Secondary | ICD-10-CM | POA: Diagnosis not present

## 2014-10-30 DIAGNOSIS — Z87891 Personal history of nicotine dependence: Secondary | ICD-10-CM | POA: Diagnosis not present

## 2014-11-04 DIAGNOSIS — C61 Malignant neoplasm of prostate: Secondary | ICD-10-CM | POA: Diagnosis not present

## 2014-11-04 DIAGNOSIS — Z510 Encounter for antineoplastic radiation therapy: Secondary | ICD-10-CM | POA: Diagnosis not present

## 2014-11-04 DIAGNOSIS — Z87891 Personal history of nicotine dependence: Secondary | ICD-10-CM | POA: Diagnosis not present

## 2014-11-05 ENCOUNTER — Ambulatory Visit
Admission: RE | Admit: 2014-11-05 | Discharge: 2014-11-05 | Disposition: A | Discharge status: Home / Self Care | Payer: Medicare Other | Source: Ambulatory Visit | Attending: Radiation Oncology | Admitting: Radiation Oncology

## 2014-11-05 DIAGNOSIS — Z510 Encounter for antineoplastic radiation therapy: Secondary | ICD-10-CM | POA: Diagnosis not present

## 2014-11-05 DIAGNOSIS — Z87891 Personal history of nicotine dependence: Secondary | ICD-10-CM | POA: Diagnosis not present

## 2014-11-05 DIAGNOSIS — C61 Malignant neoplasm of prostate: Secondary | ICD-10-CM | POA: Diagnosis not present

## 2014-11-06 ENCOUNTER — Ambulatory Visit
Admission: RE | Admit: 2014-11-06 | Discharge: 2014-11-06 | Disposition: A | Discharge status: Home / Self Care | Payer: Medicare Other | Source: Ambulatory Visit | Attending: Radiation Oncology | Admitting: Radiation Oncology

## 2014-11-06 DIAGNOSIS — C61 Malignant neoplasm of prostate: Secondary | ICD-10-CM | POA: Diagnosis not present

## 2014-11-06 DIAGNOSIS — Z87891 Personal history of nicotine dependence: Secondary | ICD-10-CM | POA: Diagnosis not present

## 2014-11-06 DIAGNOSIS — Z510 Encounter for antineoplastic radiation therapy: Secondary | ICD-10-CM | POA: Diagnosis not present

## 2014-11-07 ENCOUNTER — Encounter: Payer: Self-pay | Admitting: Radiation Oncology

## 2014-11-07 ENCOUNTER — Ambulatory Visit
Admission: RE | Admit: 2014-11-07 | Discharge: 2014-11-07 | Disposition: A | Discharge status: Home / Self Care | Payer: Medicare Other | Source: Ambulatory Visit | Attending: Radiation Oncology | Admitting: Radiation Oncology

## 2014-11-07 VITALS — BP 141/78 | HR 56 | Resp 16 | Wt 146.2 lb

## 2014-11-07 DIAGNOSIS — Z87891 Personal history of nicotine dependence: Secondary | ICD-10-CM | POA: Diagnosis not present

## 2014-11-07 DIAGNOSIS — C61 Malignant neoplasm of prostate: Secondary | ICD-10-CM | POA: Diagnosis not present

## 2014-11-07 DIAGNOSIS — Z510 Encounter for antineoplastic radiation therapy: Secondary | ICD-10-CM | POA: Diagnosis not present

## 2014-11-07 NOTE — Progress Notes (Addendum)
Weight and vitals stable. Denies pain. Reports nocturia x 2. Reports dysuria at the start of urination. Denies hematuria. Denies diarrhea. Describes a strong urine stream with post void dribble. Denies leakage or incontinence. Denies fatigue. Oriented patient and his wife to staff and routine of the clinic. Provided patient with RADIATION THERAPY AND YOU handbook then, reviewed pertinent information. Educated patient reference potential side effects and management such as, fatigue, diarrhea, and urinary/bladder changes. Provided patient with my business card and encouraged him to call with future needs. Answered all questions to the best of my ability. Patient verbalized understanding of all reviewed.   BP 141/78 mmHg  Pulse 56  Resp 16  Wt 146 lb 3.2 oz (66.316 kg) Wt Readings from Last 3 Encounters:  11/07/14 146 lb 3.2 oz (66.316 kg)  09/19/14 149 lb 11.2 oz (67.903 kg)  08/28/14 148 lb 5.9 oz (67.3 kg)

## 2014-11-07 NOTE — Progress Notes (Signed)
  Radiation Oncology         [phone]   Name: Keith Olson. MRN: [id number]   Date: 11/07/2014  DOB: [birth year]     Weekly Radiation Therapy Management    ICD-9-CM ICD-10-CM   1. Malignant neoplasm of prostate 185 C61     Current Dose: 5.85 Gy  Planned Dose:  78 Gy  Narrative The patient presents for routine under treatment assessment. Weight and vitals stable. Denies pain. Reports nocturia x 2. Reports dysuria at the start of urination. Denies hematuria. Denies diarrhea. Describes a strong urine stream with post void dribble. Denies leakage or incontinence. Denies fatigue. The patient is without complaint. Set-up films were reviewed. The chart was checked.  Physical Findings  weight is 146 lb 3.2 oz (66.316 kg). His blood pressure is 141/78 and his pulse is 56. His respiration is 16. . Weight essentially stable.  No significant changes.  Impression The patient is tolerating radiation.  Plan Continue treatment as planned.    This document serves as a record of services personally performed by Keith Pita, MD. It was created on his behalf by Keith Olson, a trained medical scribe. The creation of this record is based on the scribe's personal observations and the provider's statements to them. This document has been checked and approved by the attending provider.      Keith Olson, M.D.

## 2014-11-08 ENCOUNTER — Ambulatory Visit
Admission: RE | Admit: 2014-11-08 | Discharge: 2014-11-08 | Disposition: A | Discharge status: Home / Self Care | Payer: Medicare Other | Source: Ambulatory Visit | Attending: Radiation Oncology | Admitting: Radiation Oncology

## 2014-11-08 DIAGNOSIS — Z87891 Personal history of nicotine dependence: Secondary | ICD-10-CM | POA: Diagnosis not present

## 2014-11-08 DIAGNOSIS — C61 Malignant neoplasm of prostate: Secondary | ICD-10-CM | POA: Diagnosis not present

## 2014-11-08 DIAGNOSIS — Z510 Encounter for antineoplastic radiation therapy: Secondary | ICD-10-CM | POA: Diagnosis not present

## 2014-11-11 ENCOUNTER — Ambulatory Visit
Admission: RE | Admit: 2014-11-11 | Discharge: 2014-11-11 | Disposition: A | Discharge status: Home / Self Care | Payer: Medicare Other | Source: Ambulatory Visit | Attending: Radiation Oncology | Admitting: Radiation Oncology

## 2014-11-11 DIAGNOSIS — Z510 Encounter for antineoplastic radiation therapy: Secondary | ICD-10-CM | POA: Diagnosis not present

## 2014-11-11 DIAGNOSIS — Z87891 Personal history of nicotine dependence: Secondary | ICD-10-CM | POA: Diagnosis not present

## 2014-11-11 DIAGNOSIS — C61 Malignant neoplasm of prostate: Secondary | ICD-10-CM | POA: Diagnosis not present

## 2014-11-12 ENCOUNTER — Ambulatory Visit
Admission: RE | Admit: 2014-11-12 | Discharge: 2014-11-12 | Disposition: A | Discharge status: Home / Self Care | Payer: Medicare Other | Source: Ambulatory Visit | Attending: Radiation Oncology | Admitting: Radiation Oncology

## 2014-11-12 DIAGNOSIS — Z87891 Personal history of nicotine dependence: Secondary | ICD-10-CM | POA: Diagnosis not present

## 2014-11-12 DIAGNOSIS — Z510 Encounter for antineoplastic radiation therapy: Secondary | ICD-10-CM | POA: Diagnosis not present

## 2014-11-12 DIAGNOSIS — C61 Malignant neoplasm of prostate: Secondary | ICD-10-CM | POA: Diagnosis not present

## 2014-11-13 ENCOUNTER — Ambulatory Visit
Admission: RE | Admit: 2014-11-13 | Discharge: 2014-11-13 | Disposition: A | Discharge status: Home / Self Care | Payer: Medicare Other | Source: Ambulatory Visit | Attending: Radiation Oncology | Admitting: Radiation Oncology

## 2014-11-13 DIAGNOSIS — Z87891 Personal history of nicotine dependence: Secondary | ICD-10-CM | POA: Diagnosis not present

## 2014-11-13 DIAGNOSIS — C61 Malignant neoplasm of prostate: Secondary | ICD-10-CM | POA: Diagnosis not present

## 2014-11-13 DIAGNOSIS — Z510 Encounter for antineoplastic radiation therapy: Secondary | ICD-10-CM | POA: Diagnosis not present

## 2014-11-14 ENCOUNTER — Encounter (INDEPENDENT_AMBULATORY_CARE_PROVIDER_SITE_OTHER): Payer: Self-pay

## 2014-11-14 ENCOUNTER — Ambulatory Visit (INDEPENDENT_AMBULATORY_CARE_PROVIDER_SITE_OTHER): Payer: Medicare Other | Admitting: Physician Assistant

## 2014-11-14 ENCOUNTER — Ambulatory Visit
Admission: RE | Admit: 2014-11-14 | Discharge: 2014-11-14 | Disposition: A | Discharge status: Home / Self Care | Payer: Medicare Other | Source: Ambulatory Visit | Attending: Radiation Oncology | Admitting: Radiation Oncology

## 2014-11-14 ENCOUNTER — Encounter: Payer: Self-pay | Admitting: Physician Assistant

## 2014-11-14 VITALS — BP 146/82 | HR 65 | Temp 97.1°F | Ht 68.0 in | Wt 148.0 lb

## 2014-11-14 DIAGNOSIS — F411 Generalized anxiety disorder: Secondary | ICD-10-CM | POA: Diagnosis not present

## 2014-11-14 DIAGNOSIS — C61 Malignant neoplasm of prostate: Secondary | ICD-10-CM | POA: Diagnosis not present

## 2014-11-14 DIAGNOSIS — F329 Major depressive disorder, single episode, unspecified: Secondary | ICD-10-CM | POA: Diagnosis not present

## 2014-11-14 DIAGNOSIS — F32A Depression, unspecified: Secondary | ICD-10-CM

## 2014-11-14 DIAGNOSIS — Z510 Encounter for antineoplastic radiation therapy: Secondary | ICD-10-CM | POA: Diagnosis not present

## 2014-11-14 DIAGNOSIS — Z87891 Personal history of nicotine dependence: Secondary | ICD-10-CM | POA: Diagnosis not present

## 2014-11-14 MED ORDER — DULOXETINE HCL 30 MG PO CPEP: 30.0000 mg | ORAL_CAPSULE | Freq: Every day | ORAL | Status: DC

## 2014-11-14 NOTE — Progress Notes (Signed)
   Subjective:    Patient ID: Reon Toniann Fail., male    DOB: February 05, 1941, 74 y.o.   MRN: [id number]  HPI 74 y/o male presents with c/o increased anxiety and depression after being recently diagnosed with prostate CA. He is raising his 2 grandchildren and this is causing increased depression.     Review of Systems  Constitutional: Negative.   HENT: Negative.   Eyes: Negative.   Respiratory: Negative.   Cardiovascular: Negative.   Gastrointestinal: Negative.   Endocrine: Negative.   Genitourinary: Negative.   Musculoskeletal: Negative.   Psychiatric/Behavioral:       Increased anxiety and depression        Objective:   Physical Exam  Constitutional: He is oriented to person, place, and time. He appears well-developed and well-nourished. No distress.  Cardiovascular: Normal rate.   Musculoskeletal: Normal range of motion. He exhibits no edema or tenderness.  Neurological: He is alert and oriented to person, place, and time.  Skin: He is not diaphoretic.  Psychiatric: He has a normal mood and affect. His behavior is normal. Judgment and thought content normal.  Vitals reviewed.         Assessment & Plan:  1. Depression  - DULoxetine (CYMBALTA) 30 MG capsule; Take 1 capsule (30 mg total) by mouth daily. Increase to 2 pills daily on day 2.  Dispense: 60 capsule; Refill: 1  2. Anxiety state  - DULoxetine (CYMBALTA) 30 MG capsule; Take 1 capsule (30 mg total) by mouth daily. Increase to 2 pills daily on day 2.  Dispense: 60 capsule; Refill: 1   Continue all meds Labs pending Health Maintenance reviewed Diet and exercise encouraged RTO6 weeks   Jakell Trusty A. Benjamin Stain PA-C

## 2014-11-15 ENCOUNTER — Ambulatory Visit
Admission: RE | Admit: 2014-11-15 | Discharge: 2014-11-15 | Disposition: A | Discharge status: Home / Self Care | Payer: Medicare Other | Source: Ambulatory Visit | Attending: Radiation Oncology | Admitting: Radiation Oncology

## 2014-11-15 ENCOUNTER — Encounter: Payer: Self-pay | Admitting: Radiation Oncology

## 2014-11-15 VITALS — BP 145/70 | HR 50 | Resp 16 | Wt 151.5 lb

## 2014-11-15 DIAGNOSIS — Z87891 Personal history of nicotine dependence: Secondary | ICD-10-CM | POA: Diagnosis not present

## 2014-11-15 DIAGNOSIS — C61 Malignant neoplasm of prostate: Secondary | ICD-10-CM

## 2014-11-15 DIAGNOSIS — Z510 Encounter for antineoplastic radiation therapy: Secondary | ICD-10-CM | POA: Diagnosis not present

## 2014-11-15 NOTE — Progress Notes (Signed)
Weight and vitals stable. Denies pain. Reports nocturia x 1 on average but, last night denies nocturia at all. Reports he was straining to void and had difficulty emptying his bladder so he resumed Flomax on his own. Denies hematuria. Denies diarrhea. Describes a strong urine stream with post void dribble. Denies leakage or incontinence. Denies fatigue.   BP 145/70 mmHg  Pulse 50  Resp 16  Wt 151 lb 8 oz (68.72 kg) Wt Readings from Last 3 Encounters:  11/15/14 151 lb 8 oz (68.72 kg)  11/14/14 148 lb (67.132 kg)  11/07/14 146 lb 3.2 oz (66.316 kg)

## 2014-11-15 NOTE — Progress Notes (Signed)
  Radiation Oncology         [phone]   Name: Keith Olson. MRN: [id number]   Date: 11/15/2014  DOB: 01/22/1941     Weekly Radiation Therapy Management    ICD-9-CM ICD-10-CM   1. Malignant neoplasm of prostate 185 C61    Malignant neoplasm of prostate  Current Dose: 17.55 Gy  Planned Dose:  78 Gy  Narrative The patient presents for routine under treatment assessment. The patient's weight and vitals are currently stable. He denies symptoms of pain, leakage or incontinence, and fatigue. He reports nocturia x 1 on average.  The patient reports that he was straining to void and had difficulty emptying his bladder so he resumed the medication Flomax on his own. He additionally denies symptoms of hematuria and diarrhea. He describes a strong urine stream with post void dribble. Set-up films were reviewed. The chart was checked. The patient projected a health mental status and was accompanied by his wife for today's radiation oncology appointment.   Physical Findings  weight is 151 lb 8 oz (68.72 kg). His blood pressure is 145/70 and his pulse is 50. His respiration is 16.  The patient's weight is currently essentially stable. There is no significant changes to the status of the paients overall health to be noted at this time.   Impression  Keith Olson is a 74 year old gentleman presenting to clinic in regards to his malignant neoplasm of prostate. The patient is tolerating radiation. He understands the proper use, purpose, and administration of Floxmax in regards to his treatment and recovery. The patient understands that he can access his appointments and medical records via [place].  Plan The patient has been advised to continue treatment as planned. He is aware of his follow-up appointment with radiation oncology to take place next week Friday (11/22/2014) as scheduled. His is advised to continue the administration of the medication Flomax, as needed. All vocalized questions and concerns have  been addressed. If the patient develops any further questions or concerns in regards to his treatment and recovery, he has been encouraged to contact Dr. Tammi Klippel, MD.     This document serves as a record of services personally performed by Tyler Pita, MD. It was created on his behalf by Lenn Cal, a trained medical scribe. The creation of this record is based on the scribe's personal observations and the provider's statements to them. This document has been checked and approved by the attending provider.  ____________________________________________________________     Sheral Apley. Tammi Klippel, M.D.

## 2014-11-18 ENCOUNTER — Ambulatory Visit
Admission: RE | Admit: 2014-11-18 | Discharge: 2014-11-18 | Disposition: A | Discharge status: Home / Self Care | Payer: Medicare Other | Source: Ambulatory Visit | Attending: Radiation Oncology | Admitting: Radiation Oncology

## 2014-11-18 DIAGNOSIS — Z87891 Personal history of nicotine dependence: Secondary | ICD-10-CM | POA: Diagnosis not present

## 2014-11-18 DIAGNOSIS — C61 Malignant neoplasm of prostate: Secondary | ICD-10-CM | POA: Diagnosis not present

## 2014-11-18 DIAGNOSIS — Z510 Encounter for antineoplastic radiation therapy: Secondary | ICD-10-CM | POA: Diagnosis not present

## 2014-11-19 ENCOUNTER — Ambulatory Visit
Admission: RE | Admit: 2014-11-19 | Discharge: 2014-11-19 | Disposition: A | Discharge status: Home / Self Care | Payer: Medicare Other | Source: Ambulatory Visit | Attending: Radiation Oncology | Admitting: Radiation Oncology

## 2014-11-19 DIAGNOSIS — C61 Malignant neoplasm of prostate: Secondary | ICD-10-CM | POA: Diagnosis not present

## 2014-11-19 DIAGNOSIS — Z87891 Personal history of nicotine dependence: Secondary | ICD-10-CM | POA: Diagnosis not present

## 2014-11-19 DIAGNOSIS — Z510 Encounter for antineoplastic radiation therapy: Secondary | ICD-10-CM | POA: Diagnosis not present

## 2014-11-20 ENCOUNTER — Ambulatory Visit
Admission: RE | Admit: 2014-11-20 | Discharge: 2014-11-20 | Disposition: A | Discharge status: Home / Self Care | Payer: Medicare Other | Source: Ambulatory Visit | Attending: Radiation Oncology | Admitting: Radiation Oncology

## 2014-11-20 DIAGNOSIS — Z510 Encounter for antineoplastic radiation therapy: Secondary | ICD-10-CM | POA: Diagnosis not present

## 2014-11-20 DIAGNOSIS — Z87891 Personal history of nicotine dependence: Secondary | ICD-10-CM | POA: Diagnosis not present

## 2014-11-20 DIAGNOSIS — C61 Malignant neoplasm of prostate: Secondary | ICD-10-CM | POA: Diagnosis not present

## 2014-11-21 ENCOUNTER — Ambulatory Visit
Admission: RE | Admit: 2014-11-21 | Discharge: 2014-11-21 | Disposition: A | Discharge status: Home / Self Care | Payer: Medicare Other | Source: Ambulatory Visit | Attending: Radiation Oncology | Admitting: Radiation Oncology

## 2014-11-21 DIAGNOSIS — Z510 Encounter for antineoplastic radiation therapy: Secondary | ICD-10-CM | POA: Diagnosis not present

## 2014-11-21 DIAGNOSIS — Z87891 Personal history of nicotine dependence: Secondary | ICD-10-CM | POA: Diagnosis not present

## 2014-11-21 DIAGNOSIS — C61 Malignant neoplasm of prostate: Secondary | ICD-10-CM | POA: Diagnosis not present

## 2014-11-22 ENCOUNTER — Ambulatory Visit
Admission: RE | Admit: 2014-11-22 | Discharge: 2014-11-22 | Disposition: A | Discharge status: Home / Self Care | Payer: Medicare Other | Source: Ambulatory Visit | Attending: Radiation Oncology | Admitting: Radiation Oncology

## 2014-11-22 ENCOUNTER — Encounter: Payer: Self-pay | Admitting: Radiation Oncology

## 2014-11-22 VITALS — BP 133/74 | HR 56 | Resp 16 | Wt 146.5 lb

## 2014-11-22 DIAGNOSIS — C61 Malignant neoplasm of prostate: Secondary | ICD-10-CM

## 2014-11-22 DIAGNOSIS — Z510 Encounter for antineoplastic radiation therapy: Secondary | ICD-10-CM | POA: Diagnosis not present

## 2014-11-22 DIAGNOSIS — Z87891 Personal history of nicotine dependence: Secondary | ICD-10-CM | POA: Diagnosis not present

## 2014-11-22 NOTE — Progress Notes (Signed)
Weight and vitals stable. Denies pain. Reports nocturia x 2. Reports he has resumed Flomax and it seems to help. Reports he feels like he is emptying his bladder better since resuming the flomax. Continues to report a post void dribble. Denies hematuria. Denies dysuria. Denies diarrhea.   BP 133/74 mmHg  Pulse 56  Resp 16  Wt 146 lb 8 oz (66.452 kg)  SpO2 100% Wt Readings from Last 3 Encounters:  11/22/14 146 lb 8 oz (66.452 kg)  11/15/14 151 lb 8 oz (68.72 kg)  11/14/14 148 lb (67.132 kg)

## 2014-11-22 NOTE — Progress Notes (Signed)
  Radiation Oncology         [phone]   Name: Keith Olson. MRN: [id number]   Date: 11/22/2014  DOB: 02-17-1941     Weekly Radiation Therapy Management    ICD-9-CM ICD-10-CM   1. Malignant neoplasm of prostate 185 C61    Malignant neoplasm of prostate  Current Dose: 27.3 Gy  Planned Dose:  78 Gy  Narrative The patient presents for routine under treatment assessment. 14/40 treatments completed. The patient's weight and vitals are currently stable. Denies pain, hematuria, hematuria, dysuria, or diarrhea. Reports nocturia x 2. Reports he has resumed Flomax and it seems to help and he feels like he is emptying his bladder better since resuming it.Continues to report post void dribble. Set-up films were reviewed. The chart was checked.  Physical Findings  weight is 146 lb 8 oz (66.452 kg). His blood pressure is 133/74 and his pulse is 56. His respiration is 16 and oxygen saturation is 100%.  The patient's weight is currently essentially stable. There is no significant changes to the status of the paients overall health to be noted at this time.   Impression  Keith Olson is a 74 year old gentleman presenting to clinic in regards to his malignant neoplasm of prostate. The patient is tolerating radiation. He understands the proper use, purpose, and administration of Floxmax in regards to his treatment and recovery.  Plan The patient has been advised to continue treatment as planned. He is aware of his follow-up appointment with radiation oncology to take place next week. All vocalized questions and concerns have been addressed.    This document serves as a record of services personally performed by Tyler Pita, MD. It was created on his behalf by Darcus Austin, a trained medical scribe. The creation of this record is based on the scribe's personal observations and the provider's statements to them. This document has been checked and approved by the attending provider.      ____________________________________________________________     Sheral Apley. Tammi Klippel, M.D.

## 2014-11-25 ENCOUNTER — Ambulatory Visit
Admission: RE | Admit: 2014-11-25 | Discharge: 2014-11-25 | Disposition: A | Discharge status: Home / Self Care | Payer: Medicare Other | Source: Ambulatory Visit | Attending: Radiation Oncology | Admitting: Radiation Oncology

## 2014-11-25 DIAGNOSIS — Z87891 Personal history of nicotine dependence: Secondary | ICD-10-CM | POA: Diagnosis not present

## 2014-11-25 DIAGNOSIS — C61 Malignant neoplasm of prostate: Secondary | ICD-10-CM | POA: Diagnosis not present

## 2014-11-25 DIAGNOSIS — Z510 Encounter for antineoplastic radiation therapy: Secondary | ICD-10-CM | POA: Diagnosis not present

## 2014-11-26 ENCOUNTER — Ambulatory Visit
Admission: RE | Admit: 2014-11-26 | Discharge: 2014-11-26 | Disposition: A | Discharge status: Home / Self Care | Payer: Medicare Other | Source: Ambulatory Visit | Attending: Radiation Oncology | Admitting: Radiation Oncology

## 2014-11-26 DIAGNOSIS — Z87891 Personal history of nicotine dependence: Secondary | ICD-10-CM | POA: Diagnosis not present

## 2014-11-26 DIAGNOSIS — C61 Malignant neoplasm of prostate: Secondary | ICD-10-CM | POA: Diagnosis not present

## 2014-11-26 DIAGNOSIS — Z510 Encounter for antineoplastic radiation therapy: Secondary | ICD-10-CM | POA: Diagnosis not present

## 2014-11-27 ENCOUNTER — Ambulatory Visit
Admission: RE | Admit: 2014-11-27 | Discharge: 2014-11-27 | Disposition: A | Discharge status: Home / Self Care | Payer: Medicare Other | Source: Ambulatory Visit | Attending: Radiation Oncology | Admitting: Radiation Oncology

## 2014-11-27 DIAGNOSIS — Z87891 Personal history of nicotine dependence: Secondary | ICD-10-CM | POA: Diagnosis not present

## 2014-11-27 DIAGNOSIS — C61 Malignant neoplasm of prostate: Secondary | ICD-10-CM | POA: Diagnosis not present

## 2014-11-27 DIAGNOSIS — Z510 Encounter for antineoplastic radiation therapy: Secondary | ICD-10-CM | POA: Diagnosis not present

## 2014-11-28 ENCOUNTER — Ambulatory Visit
Admission: RE | Admit: 2014-11-28 | Discharge: 2014-11-28 | Disposition: A | Discharge status: Home / Self Care | Payer: Medicare Other | Source: Ambulatory Visit | Attending: Radiation Oncology | Admitting: Radiation Oncology

## 2014-11-28 DIAGNOSIS — Z87891 Personal history of nicotine dependence: Secondary | ICD-10-CM | POA: Diagnosis not present

## 2014-11-28 DIAGNOSIS — Z510 Encounter for antineoplastic radiation therapy: Secondary | ICD-10-CM | POA: Diagnosis not present

## 2014-11-28 DIAGNOSIS — C61 Malignant neoplasm of prostate: Secondary | ICD-10-CM | POA: Diagnosis not present

## 2014-11-29 ENCOUNTER — Ambulatory Visit
Admission: RE | Admit: 2014-11-29 | Discharge: 2014-11-29 | Disposition: A | Discharge status: Home / Self Care | Payer: Medicare Other | Source: Ambulatory Visit | Attending: Radiation Oncology | Admitting: Radiation Oncology

## 2014-11-29 ENCOUNTER — Encounter: Payer: Self-pay | Admitting: Radiation Oncology

## 2014-11-29 VITALS — BP 141/75 | HR 53 | Resp 16 | Wt 150.1 lb

## 2014-11-29 DIAGNOSIS — C61 Malignant neoplasm of prostate: Secondary | ICD-10-CM

## 2014-11-29 DIAGNOSIS — Z510 Encounter for antineoplastic radiation therapy: Secondary | ICD-10-CM | POA: Diagnosis not present

## 2014-11-29 DIAGNOSIS — Z87891 Personal history of nicotine dependence: Secondary | ICD-10-CM | POA: Diagnosis not present

## 2014-11-29 NOTE — Progress Notes (Signed)
  Radiation Oncology         [phone]   Name: Keith Olson. MRN: [id number]   Date: 11/29/2014  DOB: May 31, 1940     Weekly Radiation Therapy Management    ICD-9-CM ICD-10-CM   1. Malignant neoplasm of prostate ([place]) 185 C61    Malignant neoplasm of prostate  Current Dose: 37.05 Gy  Planned Dose:  78 Gy  Narrative The patient presents for routine under treatment assessment.   He denies symptoms of pain, hematuria, dysuria, diarrhea, or difficulty emptying his bladder. He reports nocturia x 2 and that he continues to take previously recommended Flomax as directed. The patient additionally reports post void dribble continues and preparation H helped to relieve hemorrhoidal irritation and resolve scant bright red blood in stool. "The stool is hard." Set-up films were reviewed. The chart was checked. The patient projected a health mental status and was accompanied by his wife.    Physical Findings  weight is 150 lb 1.6 oz (68.085 kg). His blood pressure is 141/75 and his pulse is 53. His respiration is 16.  The patient's weight is currently essentially stable. There is no significant changes to the status of overall health to be noted at this time. He is alert and oriented x3.   Impression Jeramia Kondracki is a 74 year old gentleman presenting to clinic in regards to his malignant neoplasm of prostate. The patient is tolerating radiation.   He understands the proper use, purpose, and administration of Floxmax in regards to his treatment and recovery. The patient additionally understands the purpose and benefits of administering stool softener in regards to his reported symptoms of blood in the stool caused by Hemorids. The patient understands that he can access his appointments and medical records via [place].   Plan The patient has been advised to continue treatment as planned. He is to continue the administration of Flomax and stool softener as needed. Other healthy methods of management in  regards to reported symptoms were reviewed in detail.  He is aware of his follow-up appointment with radiation oncology to take place next week, as scheduled.   All vocalized questions and concerns have been addressed. If the patient develops any further questions or concerns in regards to he treatment and recovery, his has been encouraged to contact Dr. Tammi Klippel, MD.   This document serves as a record of services personally performed by Tyler Pita, MD. It was created on his behalf by Lenn Cal, a trained medical scribe. The creation of this record is based on the scribe's personal observations and the provider's statements to them. This document has been checked and approved by the attending provider.  ____________________________________________________________     Sheral Apley. Tammi Klippel, M.D.

## 2014-11-29 NOTE — Progress Notes (Addendum)
Weight and vitals stable. Denies pain. Reports nocturia x 2. Reports he continues to take Flomax as directed. Denies difficulty emptying his bladder. Reports post void dribble continues. Denies hematuria or dysuria. Reports preparation H helped to relieve hemorrhoidal irritation and resolve scant bright red blood in stool. Denies diarrhea.   BP 141/75 mmHg  Pulse 53  Resp 16  Wt 150 lb 1.6 oz (68.085 kg) Wt Readings from Last 3 Encounters:  11/29/14 150 lb 1.6 oz (68.085 kg)  11/22/14 146 lb 8 oz (66.452 kg)  11/15/14 151 lb 8 oz (68.72 kg)

## 2014-12-02 ENCOUNTER — Ambulatory Visit
Admission: RE | Admit: 2014-12-02 | Discharge: 2014-12-02 | Disposition: A | Discharge status: Home / Self Care | Payer: Medicare Other | Source: Ambulatory Visit | Attending: Radiation Oncology | Admitting: Radiation Oncology

## 2014-12-02 DIAGNOSIS — Z510 Encounter for antineoplastic radiation therapy: Secondary | ICD-10-CM | POA: Diagnosis not present

## 2014-12-02 DIAGNOSIS — Z87891 Personal history of nicotine dependence: Secondary | ICD-10-CM | POA: Diagnosis not present

## 2014-12-02 DIAGNOSIS — C61 Malignant neoplasm of prostate: Secondary | ICD-10-CM | POA: Diagnosis not present

## 2014-12-03 ENCOUNTER — Ambulatory Visit
Admission: RE | Admit: 2014-12-03 | Discharge: 2014-12-03 | Disposition: A | Discharge status: Home / Self Care | Payer: Medicare Other | Source: Ambulatory Visit | Attending: Radiation Oncology | Admitting: Radiation Oncology

## 2014-12-03 DIAGNOSIS — Z87891 Personal history of nicotine dependence: Secondary | ICD-10-CM | POA: Diagnosis not present

## 2014-12-03 DIAGNOSIS — C61 Malignant neoplasm of prostate: Secondary | ICD-10-CM | POA: Diagnosis not present

## 2014-12-03 DIAGNOSIS — Z510 Encounter for antineoplastic radiation therapy: Secondary | ICD-10-CM | POA: Diagnosis not present

## 2014-12-04 ENCOUNTER — Ambulatory Visit
Admission: RE | Admit: 2014-12-04 | Discharge: 2014-12-04 | Disposition: A | Discharge status: Home / Self Care | Payer: Medicare Other | Source: Ambulatory Visit | Attending: Radiation Oncology | Admitting: Radiation Oncology

## 2014-12-04 DIAGNOSIS — Z87891 Personal history of nicotine dependence: Secondary | ICD-10-CM | POA: Diagnosis not present

## 2014-12-04 DIAGNOSIS — C61 Malignant neoplasm of prostate: Secondary | ICD-10-CM | POA: Diagnosis not present

## 2014-12-04 DIAGNOSIS — Z510 Encounter for antineoplastic radiation therapy: Secondary | ICD-10-CM | POA: Diagnosis not present

## 2014-12-05 ENCOUNTER — Other Ambulatory Visit: Payer: Self-pay | Admitting: Physician Assistant

## 2014-12-05 ENCOUNTER — Ambulatory Visit
Admission: RE | Admit: 2014-12-05 | Discharge: 2014-12-05 | Disposition: A | Discharge status: Home / Self Care | Payer: Medicare Other | Source: Ambulatory Visit | Attending: Radiation Oncology | Admitting: Radiation Oncology

## 2014-12-05 ENCOUNTER — Encounter: Payer: Self-pay | Admitting: Radiation Oncology

## 2014-12-05 VITALS — BP 132/73 | HR 62 | Resp 16 | Wt 152.0 lb

## 2014-12-05 DIAGNOSIS — Z87891 Personal history of nicotine dependence: Secondary | ICD-10-CM | POA: Diagnosis not present

## 2014-12-05 DIAGNOSIS — C61 Malignant neoplasm of prostate: Secondary | ICD-10-CM

## 2014-12-05 DIAGNOSIS — Z510 Encounter for antineoplastic radiation therapy: Secondary | ICD-10-CM | POA: Diagnosis not present

## 2014-12-05 MED ORDER — ALPRAZOLAM 1 MG PO TABS: 0.5000 mg | ORAL_TABLET | Freq: Two times a day (BID) | ORAL | Status: DC | PRN

## 2014-12-05 NOTE — Telephone Encounter (Signed)
Refill xanax, Pt with prostate cancer undergoing  Radiation therapy.   should be seen prior to next fill.   Laroy Apple, MD [facility] 12/05/2014, 3:14 PM

## 2014-12-05 NOTE — Telephone Encounter (Signed)
pts wife aware pt will need to be seen prior to any further refills. rx called into [facility] in [place].

## 2014-12-05 NOTE — Progress Notes (Signed)
  Radiation Oncology         [phone]   Name: Keith Olson. MRN: [id number]   Date: 12/05/2014  DOB: Nov 08, 1940     Weekly Radiation Therapy Management    ICD-9-CM ICD-10-CM   1. Malignant neoplasm of prostate ([place]) 185 C61    Malignant neoplasm of prostate  Current Dose: 44.85 Gy  Planned Dose:  78 Gy  Narrative The patient presents for routine under treatment assessment. He has completed 23 fractions.  He currently denies symptoms of pain or diarrhea. He reports nocturia x 2 on average but, only once last night. He confirms that he continues to use Flomax and doesn't have any difficulty emptying his bladder. Void dribble continues, rectal irritation and scant blood in stool continues, and so he continues to use Preparation H. Rectal bleeding has been occuring for about 1 1/2 weeks. "I've had Hemorid for years and years since I got out of the [employer]. I've got stents in my heart. I take aspirin every day. I have hot flashes sometimes for about five minutes. Other than that I'm doing pretty good," the patient stated.  Set-up films were reviewed. The chart was checked. The patient projected a health mental status and was not accompanied by his wife for today's radiation oncology visit.    Physical Findings  weight is 152 lb (68.947 kg). His blood pressure is 132/73 and his pulse is 62. His respiration is 16 and oxygen saturation is 100%.  The patient's weight is currently essentially stable. There is no significant changes to the status of overall health to be noted at this time. He is alert and oriented x3.   Impression Keith Olson is a 74 year old gentleman presenting to clinic in regards to his malignant neoplasm of prostate. The patient is tolerating radiation.  He understands the proper use, purpose, and administration of Floxmax in regards to his treatment and recovery. He is instructed to contact Dr. Tammi Klippel, MD if his reported rectal bleeding worsens. 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 The patient understands that he can access his appointments and medical records via [place].   Plan The patient has been advised to continue treatment as planned. He is to continue the administration of Flomax and stool softener as needed. Other healthy methods of management in regards to reported symptoms were reviewed in detail.  He is aware of his follow-up appointment with radiation oncology to take place next week, as scheduled.  All vocalized questions and concerns have been addressed. If the patient develops any further questions or concerns in regards to he treatment and recovery, his has been encouraged to contact Dr. Tammi Klippel, MD.   This document serves as a record of services personally performed by Tyler Pita, MD. It was created on his behalf by Lenn Cal, a trained medical scribe. The creation of this record is based on the scribe's personal observations and the provider's statements to them. This document has been checked and approved by the attending provider.  ____________________________________________________________     Sheral Apley. Tammi Klippel, M.D.

## 2014-12-05 NOTE — Progress Notes (Signed)
Weight and vitals stable. Denies pain. Reports nocturia x 2 on average but, only once last night. Reports he continues Flomax and doesn't have any difficulty emptying his bladder. Reports post void dribble continues. Reports rectal irritation and scant blood in stool continues. Reports he continues to use Preparation H. Denies diarrhea. Reports taking Miralax bid as directed.   BP 132/73 mmHg  Pulse 62  Resp 16  Wt 152 lb (68.947 kg)  SpO2 100% Wt Readings from Last 3 Encounters:  12/05/14 152 lb (68.947 kg)  11/29/14 150 lb 1.6 oz (68.085 kg)  11/22/14 146 lb 8 oz (66.452 kg)

## 2014-12-06 ENCOUNTER — Ambulatory Visit
Admission: RE | Admit: 2014-12-06 | Discharge: 2014-12-06 | Disposition: A | Discharge status: Home / Self Care | Payer: Medicare Other | Source: Ambulatory Visit | Attending: Radiation Oncology | Admitting: Radiation Oncology

## 2014-12-06 ENCOUNTER — Encounter: Payer: Self-pay | Admitting: Radiation Oncology

## 2014-12-06 DIAGNOSIS — Z87891 Personal history of nicotine dependence: Secondary | ICD-10-CM | POA: Diagnosis not present

## 2014-12-06 DIAGNOSIS — C61 Malignant neoplasm of prostate: Secondary | ICD-10-CM | POA: Diagnosis not present

## 2014-12-06 DIAGNOSIS — Z510 Encounter for antineoplastic radiation therapy: Secondary | ICD-10-CM | POA: Diagnosis not present

## 2014-12-09 ENCOUNTER — Ambulatory Visit
Admission: RE | Admit: 2014-12-09 | Discharge: 2014-12-09 | Disposition: A | Discharge status: Home / Self Care | Payer: Medicare Other | Source: Ambulatory Visit | Attending: Radiation Oncology | Admitting: Radiation Oncology

## 2014-12-09 DIAGNOSIS — Z87891 Personal history of nicotine dependence: Secondary | ICD-10-CM | POA: Diagnosis not present

## 2014-12-09 DIAGNOSIS — C61 Malignant neoplasm of prostate: Secondary | ICD-10-CM | POA: Diagnosis not present

## 2014-12-09 DIAGNOSIS — Z510 Encounter for antineoplastic radiation therapy: Secondary | ICD-10-CM | POA: Diagnosis not present

## 2014-12-10 ENCOUNTER — Ambulatory Visit
Admission: RE | Admit: 2014-12-10 | Discharge: 2014-12-10 | Disposition: A | Discharge status: Home / Self Care | Payer: Medicare Other | Source: Ambulatory Visit | Attending: Radiation Oncology | Admitting: Radiation Oncology

## 2014-12-10 DIAGNOSIS — Z87891 Personal history of nicotine dependence: Secondary | ICD-10-CM | POA: Diagnosis not present

## 2014-12-10 DIAGNOSIS — C61 Malignant neoplasm of prostate: Secondary | ICD-10-CM | POA: Diagnosis not present

## 2014-12-10 DIAGNOSIS — Z510 Encounter for antineoplastic radiation therapy: Secondary | ICD-10-CM | POA: Diagnosis not present

## 2014-12-11 ENCOUNTER — Ambulatory Visit
Admission: RE | Admit: 2014-12-11 | Discharge: 2014-12-11 | Disposition: A | Discharge status: Home / Self Care | Payer: Medicare Other | Source: Ambulatory Visit | Attending: Radiation Oncology | Admitting: Radiation Oncology

## 2014-12-11 DIAGNOSIS — Z87891 Personal history of nicotine dependence: Secondary | ICD-10-CM | POA: Diagnosis not present

## 2014-12-11 DIAGNOSIS — C61 Malignant neoplasm of prostate: Secondary | ICD-10-CM | POA: Diagnosis not present

## 2014-12-11 DIAGNOSIS — Z510 Encounter for antineoplastic radiation therapy: Secondary | ICD-10-CM | POA: Diagnosis not present

## 2014-12-12 ENCOUNTER — Ambulatory Visit
Admission: RE | Admit: 2014-12-12 | Discharge: 2014-12-12 | Disposition: A | Discharge status: Home / Self Care | Payer: Medicare Other | Source: Ambulatory Visit | Attending: Radiation Oncology | Admitting: Radiation Oncology

## 2014-12-12 ENCOUNTER — Encounter: Payer: Self-pay | Admitting: Radiation Oncology

## 2014-12-12 VITALS — BP 131/66 | HR 58 | Resp 16 | Wt 152.5 lb

## 2014-12-12 DIAGNOSIS — C61 Malignant neoplasm of prostate: Secondary | ICD-10-CM

## 2014-12-12 DIAGNOSIS — Z87891 Personal history of nicotine dependence: Secondary | ICD-10-CM | POA: Diagnosis not present

## 2014-12-12 DIAGNOSIS — Z510 Encounter for antineoplastic radiation therapy: Secondary | ICD-10-CM | POA: Diagnosis not present

## 2014-12-12 MED ORDER — HYDROCORTISONE 2.5 % RE CREA: 1.0000 "application " | TOPICAL_CREAM | Freq: Two times a day (BID) | RECTAL | Status: DC

## 2014-12-12 NOTE — Progress Notes (Signed)
  Radiation Oncology         [phone]   Name: Keith Olson. MRN: [id number]   Date: 12/12/2014  DOB: November 11, 1940     Weekly Radiation Therapy Management    ICD-9-CM ICD-10-CM   1. Malignant neoplasm of prostate ([place]) 185 C61    Malignant neoplasm of prostate  Current Dose: 54.6 Gy  Planned Dose:  78 Gy  Narrative The patient presents for routine under treatment assessment.   Weight and vitals stable. Denies pain. Reports nocturia x 2-3. Reports he continues Flomax and doesn't have any difficulty emptying his bladder. Reports post void dribble continues. Denies diarrhea. Reports rectal irritation and blood in stool. He has a history of hemorrhoids. Reports he continues to use Preparation H. Reports constipation thus, he is using Miralax.  Set-up films were reviewed. The chart was checked.    Physical Findings  weight is 152 lb 8 oz (69.174 kg). His blood pressure is 131/66 and his pulse is 58. His respiration is 16 and oxygen saturation is 100%.  The patient's weight is currently essentially stable. There is no significant changes to the status of overall health to be noted at this time.    Impression Keith Olson is a 74 year old gentleman presenting to clinic in regards to his malignant neoplasm of prostate. The patient is tolerating radiation.   Plan The patient has been advised to continue treatment as planned. He is to continue the administration of Flomax and stool softener as needed. Other healthy methods of management in regards to reported symptoms were reviewed in detail.  He is aware of his follow-up appointment with radiation oncology to take place next week, as scheduled.  I have electronically prescribed for the patient a prescription for Proctosol HC 2.5% today.  All vocalized questions and concerns have been addressed. If the patient develops any further questions or concerns in regards to he treatment and recovery, his has been encouraged to contact Dr. Tammi Klippel, MD.     This document serves as a record of services personally performed by Tyler Pita, MD. It was created on his behalf by Arlyce Harman, a trained medical scribe. The creation of this record is based on the scribe's personal observations and the provider's statements to them. This document has been checked and approved by the attending provider.    ____________________________________________________________     Sheral Apley. Tammi Klippel, M.D.

## 2014-12-12 NOTE — Progress Notes (Signed)
Weight and vitals stable. Denies pain. Reports nocturia x 2-3. Reports he continues Flomax and doesn't have any difficulty emptying his bladder. Reports post void dribble continues. Denies diarrhea. Reports rectal irritation and blood in stool. Reports he continues to use Preparation H. Reports constipation thus, he is using Miralax.  BP 131/66 mmHg  Pulse 58  Resp 16  Wt 152 lb 8 oz (69.174 kg)  SpO2 100% Wt Readings from Last 3 Encounters:  12/12/14 152 lb 8 oz (69.174 kg)  12/05/14 152 lb (68.947 kg)  11/29/14 150 lb 1.6 oz (68.085 kg)

## 2014-12-13 ENCOUNTER — Ambulatory Visit
Admission: RE | Admit: 2014-12-13 | Discharge: 2014-12-13 | Disposition: A | Discharge status: Home / Self Care | Payer: Medicare Other | Source: Ambulatory Visit | Attending: Radiation Oncology | Admitting: Radiation Oncology

## 2014-12-13 DIAGNOSIS — Z510 Encounter for antineoplastic radiation therapy: Secondary | ICD-10-CM | POA: Diagnosis not present

## 2014-12-13 DIAGNOSIS — Z87891 Personal history of nicotine dependence: Secondary | ICD-10-CM | POA: Diagnosis not present

## 2014-12-13 DIAGNOSIS — C61 Malignant neoplasm of prostate: Secondary | ICD-10-CM | POA: Diagnosis not present

## 2014-12-16 ENCOUNTER — Ambulatory Visit (INDEPENDENT_AMBULATORY_CARE_PROVIDER_SITE_OTHER): Payer: Medicare Other | Admitting: Family Medicine

## 2014-12-16 ENCOUNTER — Ambulatory Visit
Admission: RE | Admit: 2014-12-16 | Discharge: 2014-12-16 | Disposition: A | Discharge status: Home / Self Care | Payer: Medicare Other | Source: Ambulatory Visit | Attending: Radiation Oncology | Admitting: Radiation Oncology

## 2014-12-16 ENCOUNTER — Encounter: Payer: Self-pay | Admitting: Family Medicine

## 2014-12-16 VITALS — BP 158/81 | HR 51 | Temp 97.1°F | Ht 68.0 in | Wt 149.4 lb

## 2014-12-16 DIAGNOSIS — F32A Depression, unspecified: Secondary | ICD-10-CM

## 2014-12-16 DIAGNOSIS — F329 Major depressive disorder, single episode, unspecified: Secondary | ICD-10-CM

## 2014-12-16 DIAGNOSIS — F411 Generalized anxiety disorder: Secondary | ICD-10-CM

## 2014-12-16 DIAGNOSIS — C61 Malignant neoplasm of prostate: Secondary | ICD-10-CM | POA: Diagnosis not present

## 2014-12-16 DIAGNOSIS — Z87891 Personal history of nicotine dependence: Secondary | ICD-10-CM | POA: Diagnosis not present

## 2014-12-16 DIAGNOSIS — Z510 Encounter for antineoplastic radiation therapy: Secondary | ICD-10-CM | POA: Diagnosis not present

## 2014-12-16 MED ORDER — ALPRAZOLAM 1 MG PO TABS: 0.5000 mg | ORAL_TABLET | Freq: Two times a day (BID) | ORAL | Status: DC | PRN

## 2014-12-16 MED ORDER — AZITHROMYCIN 250 MG PO TABS: ORAL_TABLET | ORAL | Status: DC

## 2014-12-16 MED ORDER — DULOXETINE HCL 60 MG PO CPEP: 60.0000 mg | ORAL_CAPSULE | Freq: Every day | ORAL | Status: DC

## 2014-12-16 MED ORDER — TRAMADOL HCL 50 MG PO TABS: 50.0000 mg | ORAL_TABLET | Freq: Two times a day (BID) | ORAL | Status: DC | PRN

## 2014-12-16 NOTE — Patient Instructions (Signed)
Great to meet you!   Come back in two months to see how your anxiety is doing.

## 2014-12-16 NOTE — Progress Notes (Signed)
   HPI  Patient presents today to discuss anxiety and cough.  Anxiety Has a long history of anxiety, treated with Xanax and doing well. Tolerating Cymbalta well. States that it's worse since he has started treatment for prostate cancer.  Hip pain Has known hip arthritis and states that his pain is helped by tramadol. Requests a medications, states that seems to be getting worse.  Denies suicidality.  Also complains of 2 weeks of nonproductive cough, malaise, subjective fever, decreased appetite  PMH: Smoking status noted ROS: Per HPI  Objective: BP 158/81 mmHg  Pulse 51  Temp(Src) 97.1 F (36.2 C) (Oral)  Ht '5\' 8"'$  (1.727 m)  Wt 149 lb 6.4 oz (67.767 kg)  BMI 22.72 kg/m2 Gen: NAD, alert, cooperative with exam HEENT: NCAT CV: RRR, good S1/S2, no murmur Resp: CTABL, no wheezes, non-labored Ext: No edema, warm Neuro: Alert and oriented, No gross deficits Mood screening tools:  PHQ-9 score: 5, No SI GAD-7 Score: 19  Assessment and plan:  # Anxiety Continue Cymbalta, should see effects within 2 months. Refill Xanax, likely exacerbated recently by prostate cancer. Back to the clinic in 2 months.  # Cough Treat as pneumonia considering subjective fever, decreased appetite, malaise, and time course. Azithromycin  # Hip pain Known arthritis, treat with tramadol Some increased risk of serotonin syndrome considering Cymbalta use, however will keep it to 50 mg every 12 hours when necessary to reduce this risk   Meds ordered this encounter  Medications  . DULoxetine (CYMBALTA) 60 MG capsule    Sig: Take 1 capsule (60 mg total) by mouth daily.    Dispense:  60 capsule    Refill:  3  . ALPRAZolam (XANAX) 1 MG tablet    Sig: Take 0.5-1 tablets (0.5-1 mg total) by mouth 2 (two) times daily as needed for anxiety.    Dispense:  60 tablet    Refill:  1  . azithromycin (ZITHROMAX) 250 MG tablet    Sig: take 2 tablets on day 1 and 1 tablet daily after that    Dispense:  6  tablet    Refill:  0    Laroy Apple, MD [facility] 12/16/2014, 4:38 PM

## 2014-12-17 ENCOUNTER — Ambulatory Visit
Admission: RE | Admit: 2014-12-17 | Discharge: 2014-12-17 | Disposition: A | Discharge status: Home / Self Care | Payer: Medicare Other | Source: Ambulatory Visit | Attending: Radiation Oncology | Admitting: Radiation Oncology

## 2014-12-17 DIAGNOSIS — Z510 Encounter for antineoplastic radiation therapy: Secondary | ICD-10-CM | POA: Diagnosis not present

## 2014-12-17 DIAGNOSIS — C61 Malignant neoplasm of prostate: Secondary | ICD-10-CM | POA: Diagnosis not present

## 2014-12-17 DIAGNOSIS — Z87891 Personal history of nicotine dependence: Secondary | ICD-10-CM | POA: Diagnosis not present

## 2014-12-18 ENCOUNTER — Ambulatory Visit
Admission: RE | Admit: 2014-12-18 | Discharge: 2014-12-18 | Disposition: A | Discharge status: Home / Self Care | Payer: Medicare Other | Source: Ambulatory Visit | Attending: Radiation Oncology | Admitting: Radiation Oncology

## 2014-12-18 DIAGNOSIS — C61 Malignant neoplasm of prostate: Secondary | ICD-10-CM | POA: Diagnosis not present

## 2014-12-18 DIAGNOSIS — Z87891 Personal history of nicotine dependence: Secondary | ICD-10-CM | POA: Diagnosis not present

## 2014-12-18 DIAGNOSIS — Z510 Encounter for antineoplastic radiation therapy: Secondary | ICD-10-CM | POA: Diagnosis not present

## 2014-12-19 ENCOUNTER — Ambulatory Visit
Admission: RE | Admit: 2014-12-19 | Discharge: 2014-12-19 | Disposition: A | Discharge status: Home / Self Care | Payer: Medicare Other | Source: Ambulatory Visit | Attending: Radiation Oncology | Admitting: Radiation Oncology

## 2014-12-19 DIAGNOSIS — C61 Malignant neoplasm of prostate: Secondary | ICD-10-CM | POA: Diagnosis not present

## 2014-12-19 DIAGNOSIS — Z87891 Personal history of nicotine dependence: Secondary | ICD-10-CM | POA: Diagnosis not present

## 2014-12-19 DIAGNOSIS — Z510 Encounter for antineoplastic radiation therapy: Secondary | ICD-10-CM | POA: Diagnosis not present

## 2014-12-20 ENCOUNTER — Ambulatory Visit
Admission: RE | Admit: 2014-12-20 | Discharge: 2014-12-20 | Disposition: A | Discharge status: Home / Self Care | Payer: Medicare Other | Source: Ambulatory Visit | Attending: Radiation Oncology | Admitting: Radiation Oncology

## 2014-12-20 ENCOUNTER — Encounter: Payer: Self-pay | Admitting: Radiation Oncology

## 2014-12-20 VITALS — BP 126/65 | HR 60 | Resp 16 | Wt 153.0 lb

## 2014-12-20 DIAGNOSIS — Z87891 Personal history of nicotine dependence: Secondary | ICD-10-CM | POA: Diagnosis not present

## 2014-12-20 DIAGNOSIS — Z510 Encounter for antineoplastic radiation therapy: Secondary | ICD-10-CM | POA: Diagnosis not present

## 2014-12-20 DIAGNOSIS — C61 Malignant neoplasm of prostate: Secondary | ICD-10-CM

## 2014-12-20 NOTE — Progress Notes (Signed)
Weight and vital stable. Denies pain. Reports nocturia x 3-4. Denies diarrhea. Managing constipation with Miralax. Denies rectal irritation. Reports dysuria only at the start of urination which quickly resolves.   BP 126/65 mmHg  Pulse 60  Resp 16  Wt 153 lb (69.4 kg)  SpO2 100% Wt Readings from Last 3 Encounters:  12/20/14 153 lb (69.4 kg)  12/16/14 149 lb 6.4 oz (67.767 kg)  12/12/14 152 lb 8 oz (69.174 kg)

## 2014-12-20 NOTE — Progress Notes (Signed)
   Department of Radiation Oncology  Phone:  [phone] Fax:        [phone]  Weekly Treatment Note    Name: Keith Olson. Date: 12/20/2014 MRN: [id number] DOB: Sep 20, 1940   Current dose: 66.3 Gy  Current fraction: 34   MEDICATIONS: Current Outpatient Prescriptions  Medication Sig Dispense Refill  . ALPRAZolam (XANAX) 1 MG tablet Take 0.5-1 tablets (0.5-1 mg total) by mouth 2 (two) times daily as needed for anxiety. 60 tablet 1  . azithromycin (ZITHROMAX) 250 MG tablet take 2 tablets on day 1 and 1 tablet daily after that 6 tablet 0  . DULoxetine (CYMBALTA) 30 MG capsule     . DULoxetine (CYMBALTA) 60 MG capsule Take 1 capsule (60 mg total) by mouth daily. 60 capsule 3  . fluticasone (FLONASE) 50 MCG/ACT nasal spray Place 2 sprays into both nostrils daily. 16 g 6  . hydrocortisone (PROCTOSOL HC) 2.5 % rectal cream Place 1 application rectally 2 (two) times daily. 30 g 0  . tamsulosin (FLOMAX) 0.4 MG CAPS capsule TAKE TWO CAPSULES BY MOUTH ONCE DAILY 60 capsule 1  . traMADol (ULTRAM) 50 MG tablet Take 1 tablet (50 mg total) by mouth every 12 (twelve) hours as needed. 60 tablet 2   No current facility-administered medications for this encounter.     ALLERGIES: Lipitor   LABORATORY DATA:  Lab Results  Component Value Date   WBC 6.1 08/26/2014   HGB 12.8* 08/29/2014   HCT 37.6* 08/29/2014   MCV 91.1 08/26/2014   PLT 193 08/26/2014   Lab Results  Component Value Date   NA 140 08/29/2014   K 4.4 08/29/2014   CL 109 08/29/2014   CO2 24 08/29/2014   Lab Results  Component Value Date   ALT 25 07/24/2014   AST 23 07/24/2014   ALKPHOS 69 07/24/2014   BILITOT 0.3 07/24/2014     NARRATIVE: Keith Olson. was seen today for weekly treatment management. The chart was checked and the patient's films were reviewed.  Weight and vital stable. Denies pain. Reports nocturia x 3-4. Denies diarrhea. Managing constipation with Miralax. Denies rectal irritation.  Reports dysuria only at the start of urination which quickly resolves. Frequency has increased over the last week.   PHYSICAL EXAMINATION: weight is 153 lb (69.4 kg). His blood pressure is 126/65 and his pulse is 60. His respiration is 16 and oxygen saturation is 100%.        ASSESSMENT: The patient is doing satisfactorily with treatment.  PLAN: We will continue with the patient's radiation treatment as planned.   This document serves as a record of services personally performed by Kyung Rudd, MD. It was created on his behalf by Arlyce Harman, a trained medical scribe. The creation of this record is based on the scribe's personal observations and the provider's statements to them. This document has been checked and approved by the attending provider. ------------------------------------------------  Jodelle Gross, MD, PhD

## 2014-12-23 ENCOUNTER — Ambulatory Visit
Admission: RE | Admit: 2014-12-23 | Discharge: 2014-12-23 | Disposition: A | Discharge status: Home / Self Care | Payer: Medicare Other | Source: Ambulatory Visit | Attending: Radiation Oncology | Admitting: Radiation Oncology

## 2014-12-23 DIAGNOSIS — Z87891 Personal history of nicotine dependence: Secondary | ICD-10-CM | POA: Diagnosis not present

## 2014-12-23 DIAGNOSIS — C61 Malignant neoplasm of prostate: Secondary | ICD-10-CM | POA: Diagnosis not present

## 2014-12-23 DIAGNOSIS — Z510 Encounter for antineoplastic radiation therapy: Secondary | ICD-10-CM | POA: Diagnosis not present

## 2014-12-24 ENCOUNTER — Ambulatory Visit
Admission: RE | Admit: 2014-12-24 | Discharge: 2014-12-24 | Disposition: A | Discharge status: Home / Self Care | Payer: Medicare Other | Source: Ambulatory Visit | Attending: Radiation Oncology | Admitting: Radiation Oncology

## 2014-12-24 DIAGNOSIS — Z87891 Personal history of nicotine dependence: Secondary | ICD-10-CM | POA: Diagnosis not present

## 2014-12-24 DIAGNOSIS — Z510 Encounter for antineoplastic radiation therapy: Secondary | ICD-10-CM | POA: Diagnosis not present

## 2014-12-24 DIAGNOSIS — C61 Malignant neoplasm of prostate: Secondary | ICD-10-CM | POA: Diagnosis not present

## 2014-12-25 ENCOUNTER — Ambulatory Visit
Admission: RE | Admit: 2014-12-25 | Discharge: 2014-12-25 | Disposition: A | Discharge status: Home / Self Care | Payer: Medicare Other | Source: Ambulatory Visit | Attending: Radiation Oncology | Admitting: Radiation Oncology

## 2014-12-25 DIAGNOSIS — C61 Malignant neoplasm of prostate: Secondary | ICD-10-CM | POA: Diagnosis not present

## 2014-12-25 DIAGNOSIS — Z87891 Personal history of nicotine dependence: Secondary | ICD-10-CM | POA: Diagnosis not present

## 2014-12-25 DIAGNOSIS — Z510 Encounter for antineoplastic radiation therapy: Secondary | ICD-10-CM | POA: Diagnosis not present

## 2014-12-26 ENCOUNTER — Ambulatory Visit
Admission: RE | Admit: 2014-12-26 | Discharge: 2014-12-26 | Disposition: A | Discharge status: Home / Self Care | Payer: Medicare Other | Source: Ambulatory Visit | Attending: Radiation Oncology | Admitting: Radiation Oncology

## 2014-12-26 DIAGNOSIS — C61 Malignant neoplasm of prostate: Secondary | ICD-10-CM | POA: Diagnosis not present

## 2014-12-26 DIAGNOSIS — Z510 Encounter for antineoplastic radiation therapy: Secondary | ICD-10-CM | POA: Diagnosis not present

## 2014-12-26 DIAGNOSIS — Z87891 Personal history of nicotine dependence: Secondary | ICD-10-CM | POA: Diagnosis not present

## 2014-12-27 ENCOUNTER — Telehealth: Payer: Self-pay | Admitting: Family Medicine

## 2014-12-27 ENCOUNTER — Ambulatory Visit
Admission: RE | Admit: 2014-12-27 | Discharge: 2014-12-27 | Disposition: A | Discharge status: Home / Self Care | Payer: Medicare Other | Source: Ambulatory Visit | Attending: Radiation Oncology | Admitting: Radiation Oncology

## 2014-12-27 ENCOUNTER — Encounter: Payer: Self-pay | Admitting: Radiation Oncology

## 2014-12-27 VITALS — BP 123/63 | HR 58 | Temp 97.6°F | Resp 18 | Ht 68.0 in | Wt 152.0 lb

## 2014-12-27 DIAGNOSIS — C61 Malignant neoplasm of prostate: Secondary | ICD-10-CM

## 2014-12-27 DIAGNOSIS — Z510 Encounter for antineoplastic radiation therapy: Secondary | ICD-10-CM | POA: Diagnosis not present

## 2014-12-27 DIAGNOSIS — Z87891 Personal history of nicotine dependence: Secondary | ICD-10-CM | POA: Diagnosis not present

## 2014-12-27 NOTE — Progress Notes (Signed)

## 2014-12-27 NOTE — Progress Notes (Signed)
  Radiation Oncology         [phone]   Name: Keith Olson. MRN: [id number]   Date: 12/27/2014  DOB: 07/01/1940     Weekly Radiation Therapy Management    ICD-9-CM ICD-10-CM   1. Malignant neoplasm of prostate ([place]) 185 C61    Malignant neoplasm of prostate  Current Dose: 76.05 Gy  Planned Dose:  78 Gy  Narrative The patient presents for routine under treatment assessment.   Abelardo Nam denies pain. He reports having hot flashes that last for 10 minutes that started a week after radiation started. He reports an increase in urinary frequency with small amounts every hour. He reports getting up 4 times last night to urinate. He reports dysuria when he starts his stream. He said this has been happening for 2 weeks. He denies hematuria. He reports constipation and is taking miralax twice a day. His last bm was yesterday and he said it was small. He reports having rectal bleeding from straining and also reports having hemorrhoids. He reports fatigue. He has been given a one month follow up appointment and the constipation education handout.  Set-up films were reviewed. The chart was checked.    Physical Findings  height is '5\' 8"'$  (1.727 m) and weight is 152 lb (68.947 kg). His oral temperature is 97.6 F (36.4 C). His blood pressure is 123/63 and his pulse is 58. His respiration is 18.  The patient's weight is currently essentially stable. There is no significant changes to the status of overall health to be noted at this time.    Impression Keith Olson is a 74 year old gentleman presenting to clinic in regards to his malignant neoplasm of prostate. The patient is tolerating radiation.   Plan The patient has been advised to continue treatment as planned. He is to continue the administration of Flomax and stool softener as needed. Other healthy methods of management in regards to reported symptoms were reviewed in detail.  He is aware of his follow-up appointment with radiation oncology to  take place next week, as scheduled.  All vocalized questions and concerns have been addressed. If the patient develops any further questions or concerns in regards to he treatment and recovery, his has been encouraged to contact Dr. Tammi Klippel, MD.  He will be completing treatment on Monday. He will follow up in a month.   ____________________________________________________________     Sheral Apley. Tammi Klippel, M.D.  This document serves as a record of services personally performed by Tyler Pita, MD. It was created on his behalf by Lendon Collar, a trained medical scribe. The creation of this record is based on the scribe's personal observations and the provider's statements to them. This document has been checked and approved by the attending provider.

## 2014-12-27 NOTE — Progress Notes (Signed)
Keith Olson denies pain.  He reports having hot flashes that last for 10 minutes that started a week after radiation started.  He reports an increase in urinary frequency with small amounts every hour.  He reports getting up 4 times last night to urinate.  He reports dysuria when he starts his stream.  He said this has been happening for 2 weeks.  He denies hematuria.  He reports constipation and is taking miralax twice a day.  His last bm was yesterday and he said it was small.  He reports having rectal bleeding from straining and also reports having hemorrhoids.  He reports fatigue.  He has been given a one month follow up appointment and the constipation education handout.  BP 123/63 mmHg  Pulse 58  Temp(Src) 97.6 F (36.4 C) (Oral)  Resp 18  Ht '5\' 8"'$  (1.727 m)  Wt 152 lb (68.947 kg)  BMI 23.12 kg/m2

## 2014-12-29 ENCOUNTER — Ambulatory Visit: Admission: RE | Admit: 2014-12-29 | Payer: Medicare Other | Source: Ambulatory Visit

## 2014-12-30 ENCOUNTER — Ambulatory Visit
Admission: RE | Admit: 2014-12-30 | Discharge: 2014-12-30 | Disposition: A | Discharge status: Home / Self Care | Payer: Medicare Other | Source: Ambulatory Visit | Attending: Radiation Oncology | Admitting: Radiation Oncology

## 2014-12-30 ENCOUNTER — Encounter: Payer: Self-pay | Admitting: Radiation Oncology

## 2014-12-30 ENCOUNTER — Ambulatory Visit: Payer: Medicare Other

## 2014-12-30 DIAGNOSIS — Z87891 Personal history of nicotine dependence: Secondary | ICD-10-CM | POA: Diagnosis not present

## 2014-12-30 DIAGNOSIS — Z510 Encounter for antineoplastic radiation therapy: Secondary | ICD-10-CM | POA: Diagnosis not present

## 2014-12-30 DIAGNOSIS — C61 Malignant neoplasm of prostate: Secondary | ICD-10-CM | POA: Diagnosis not present

## 2015-01-06 ENCOUNTER — Encounter: Payer: Self-pay | Admitting: Family Medicine

## 2015-01-06 ENCOUNTER — Ambulatory Visit (INDEPENDENT_AMBULATORY_CARE_PROVIDER_SITE_OTHER): Payer: Medicare Other | Admitting: Family Medicine

## 2015-01-06 VITALS — BP 139/89 | HR 63 | Temp 97.1°F | Ht 68.0 in | Wt 150.8 lb

## 2015-01-06 DIAGNOSIS — H6591 Unspecified nonsuppurative otitis media, right ear: Secondary | ICD-10-CM | POA: Diagnosis not present

## 2015-01-06 DIAGNOSIS — Z1211 Encounter for screening for malignant neoplasm of colon: Secondary | ICD-10-CM

## 2015-01-06 MED ORDER — AMOXICILLIN 875 MG PO TABS: 875.0000 mg | ORAL_TABLET | Freq: Two times a day (BID) | ORAL | Status: DC

## 2015-01-06 NOTE — Progress Notes (Signed)
BP 139/89 mmHg  Pulse 63  Temp(Src) 97.1 F (36.2 C) (Oral)  Ht '5\' 8"'$  (1.727 m)  Wt 150 lb 12.8 oz (68.402 kg)  BMI 22.93 kg/m2   Subjective:    Patient ID: Keith Toniann Fail., male    DOB: 02-02-1941, 74 y.o.   MRN: [id number]  HPI: Keith Olson. is a 74 y.o. male presenting on 01/06/2015 for Right ear stopped up and Referral   HPI Right ear pressure Patient has been feeling like his right ear is been stopped up for the past 2 weeks. He denies fevers or chills. He denies any cough or sore throat or runny nose. He feels like he might have wax in his ear and that it is plugged up because he is not hearing as well. He denies any pain just more this muffled feeling.  Relevant past medical, surgical, family and social history reviewed and updated as indicated. Interim medical history since our last visit reviewed. Allergies and medications reviewed and updated.  Review of Systems  Constitutional: Negative for fever.  HENT: Positive for ear pain. Negative for congestion, ear discharge, rhinorrhea, sinus pressure, sneezing and sore throat.   Eyes: Negative for pain, discharge, redness and visual disturbance.  Respiratory: Negative for cough, chest tightness, shortness of breath and wheezing.   Cardiovascular: Negative for chest pain and leg swelling.  Gastrointestinal: Negative for abdominal pain, diarrhea and constipation.  Genitourinary: Negative for difficulty urinating.  Musculoskeletal: Negative for back pain and gait problem.  Skin: Negative for rash.  Neurological: Negative for syncope, light-headedness and headaches.  All other systems reviewed and are negative.   Per HPI unless specifically indicated above     Medication List       This list is accurate as of: 01/06/15 12:58 PM.  Always use your most recent med list.               ALPRAZolam 1 MG tablet  Commonly known as:  XANAX  Take 0.5-1 tablets (0.5-1 mg total) by mouth 2 (two) times daily as needed for  anxiety.     amoxicillin 875 MG tablet  Commonly known as:  AMOXIL  Take 1 tablet (875 mg total) by mouth 2 (two) times daily.     DULoxetine 30 MG capsule  Commonly known as:  CYMBALTA     DULoxetine 60 MG capsule  Commonly known as:  CYMBALTA  Take 1 capsule (60 mg total) by mouth daily.     fluticasone 50 MCG/ACT nasal spray  Commonly known as:  FLONASE  Place 2 sprays into both nostrils daily.     hydrocortisone 2.5 % rectal cream  Commonly known as:  PROCTOSOL HC  Place 1 application rectally 2 (two) times daily.     tamsulosin 0.4 MG Caps capsule  Commonly known as:  FLOMAX  TAKE TWO CAPSULES BY MOUTH ONCE DAILY     traMADol 50 MG tablet  Commonly known as:  ULTRAM  Take 1 tablet (50 mg total) by mouth every 12 (twelve) hours as needed.           Objective:    BP 139/89 mmHg  Pulse 63  Temp(Src) 97.1 F (36.2 C) (Oral)  Ht '5\' 8"'$  (1.727 m)  Wt 150 lb 12.8 oz (68.402 kg)  BMI 22.93 kg/m2  Wt Readings from Last 3 Encounters:  01/06/15 150 lb 12.8 oz (68.402 kg)  12/27/14 152 lb (68.947 kg)  12/20/14 153 lb (69.4 kg)    Physical Exam  Constitutional: He is oriented to person, place, and time. He appears well-developed and well-nourished. No distress.  HENT:  Right Ear: External ear and ear canal normal. No tenderness. Tympanic membrane is injected, erythematous and bulging. Tympanic membrane is not scarred, not perforated and not retracted. A middle ear effusion is present.  Left Ear: Tympanic membrane, external ear and ear canal normal.  Nose: Nose normal. Right sinus exhibits no maxillary sinus tenderness and no frontal sinus tenderness. Left sinus exhibits no maxillary sinus tenderness and no frontal sinus tenderness.  Mouth/Throat: Uvula is midline, oropharynx is clear and moist and mucous membranes are normal.  Eyes: Conjunctivae and EOM are normal. Pupils are equal, round, and reactive to light. Right eye exhibits no discharge. No scleral icterus.    Cardiovascular: Normal rate, regular rhythm, normal heart sounds and intact distal pulses.   No murmur heard. Pulmonary/Chest: Effort normal and breath sounds normal. No respiratory distress. He has no wheezes.  Abdominal: He exhibits no distension.  Musculoskeletal: Normal range of motion. He exhibits no edema.  Neurological: He is alert and oriented to person, place, and time. Coordination normal.  Skin: Skin is warm and dry. No rash noted. He is not diaphoretic.  Psychiatric: He has a normal mood and affect. His behavior is normal.  Vitals reviewed.   Results for orders placed or performed during the hospital encounter of 08/28/14  CK total and CKMB (cardiac)not at [facility]  Result Value Ref Range   Total CK 87 49 - 397 U/L   CK, MB 3.3 0.5 - 5.0 ng/mL   Relative Index RELATIVE INDEX IS INVALID 0.0 - 2.5  Troponin I  Result Value Ref Range   Troponin I <0.03 <0.031 ng/mL  Basic metabolic panel  Result Value Ref Range   Sodium 140 135 - 145 mmol/L   Potassium 4.4 3.5 - 5.1 mmol/L   Chloride 109 101 - 111 mmol/L   CO2 24 22 - 32 mmol/L   Glucose, Bld 87 65 - 99 mg/dL   BUN 6 6 - 20 mg/dL   Creatinine, Ser 0.72 0.61 - 1.24 mg/dL   Calcium 8.3 (L) 8.9 - 10.3 mg/dL   GFR calc non Af Amer >60 >60 mL/min   GFR calc Af Amer >60 >60 mL/min   Anion gap 7 5 - 15  Hemoglobin and hematocrit, blood  Result Value Ref Range   Hemoglobin 12.8 (L) 13.0 - 17.0 g/dL   HCT 37.6 (L) 39.0 - 52.0 %      Assessment & Plan:   Problem List Items Addressed This Visit    None    Visit Diagnoses    Screening for colon cancer    -  Primary    Relevant Orders    Ambulatory referral to Gastroenterology    Right otitis media with effusion        Relevant Medications    amoxicillin (AMOXIL) 875 MG tablet        Follow up plan: Return in about 2 months (around 03/08/2015), or if symptoms worsen or fail to improve.  Caryl Pina, MD [facility] 01/06/2015, 12:58  PM

## 2015-01-07 ENCOUNTER — Encounter: Payer: Self-pay | Admitting: Internal Medicine

## 2015-01-25 NOTE — Progress Notes (Signed)
  Radiation Oncology         [phone]) [phone] ________________________________  Name: Keith Olson. MRN: [id number]  Date: 12/30/2014  DOB: 10-11-1940  End of Treatment Note   ICD-9-CM ICD-10-CM    1. Malignant neoplasm of prostate 185 C61     DIAGNOSIS: 74 y.o. 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  Image guidance was performed with daily cone beam CT prior to each fraction to align to gold markers in the prostate and assure proper bladder and rectal fill volumes.  Immobilization was achieved with BodyFix custom mold.  Narrative: The patient tolerated radiation treatment relatively well.   The patient experienced some minor urinary irritation and frequency, as well as hot flashes, constipation, and modest fatigue. He also had rectal bleeding attributed to hemorrhoids.   Plan: The patient has completed radiation treatment. He will return to radiation oncology clinic for routine followup in one month. I advised him to call or return sooner if he has any questions or concerns related to his recovery or treatment. ________________________________  Sheral Apley. Tammi Klippel, M.D.  This document serves as a record of services personally performed by Tyler Pita, MD. It was created on his behalf by Arlyce Harman, a trained medical scribe. The creation of this record is based on the scribe's personal observations and the provider's statements to them. This document has been checked and approved by the attending  provider.

## 2015-01-30 ENCOUNTER — Ambulatory Visit
Admission: RE | Admit: 2015-01-30 | Discharge: 2015-01-30 | Disposition: A | Discharge status: Home / Self Care | Payer: Medicare Other | Source: Ambulatory Visit | Attending: Radiation Oncology | Admitting: Radiation Oncology

## 2015-01-30 ENCOUNTER — Telehealth: Payer: Self-pay | Admitting: *Deleted

## 2015-01-30 NOTE — Telephone Encounter (Signed)
CALLED PATIENT TO ASK ABOUT MISSING TODAY'S APPT., LVM  FOR A RETURN CALL

## 2015-02-03 ENCOUNTER — Telehealth: Payer: Self-pay | Admitting: Radiation Oncology

## 2015-02-03 NOTE — Telephone Encounter (Signed)
Patient phoned this morning requesting "help." Patient states, "I have been up since 0200 with severe testicular, prostate and low abdomen pain that radiates around to his back and down my legs." Patient reports the pain began one week ago but, has gotten worse. Patient reports seeing blood in his stool intermittently. Denies hematuria or dysuria. Describes a strong steady stream. Reports he is experiencing hot flashes every thirty minutes where he "pours sweat for five minutes or so" then it resolves. Patient completed radiation therapy on 12/30/14. Also, patient requesting financial assistance. Patient explains he was receiving $3000 a month from the [place] because he has prostate cancer after being exposed to agent orange. He goes onto explain that now that radiation is complete the money has stopped. Patient states, "the money has stopped but the bills are still coming in." Patient understands this RN will reach out to [employer] about financial matter. Also, patient understands this RN will inform Dr. Tammi Klippel of these findings and call him back with further directions. Patient scheduled to follow up with [facility] on 12/27 and [facility] on 12/15.

## 2015-02-03 NOTE — Telephone Encounter (Signed)
He missed his follow-up with me on 12/1.  I would be happy to see him back sooner.

## 2015-02-04 ENCOUNTER — Telehealth: Payer: Self-pay | Admitting: Family Medicine

## 2015-02-04 ENCOUNTER — Telehealth: Payer: Self-pay | Admitting: Radiation Oncology

## 2015-02-04 NOTE — Telephone Encounter (Signed)
Phoned patient to inform him that Dr. Tammi Klippel would like to see him Thursday morning at 0800 reference his pain. No answer. Left message requesting return call.

## 2015-02-06 ENCOUNTER — Ambulatory Visit
Admission: RE | Admit: 2015-02-06 | Discharge: 2015-02-06 | Disposition: A | Discharge status: Home / Self Care | Payer: Medicare Other | Source: Ambulatory Visit | Attending: Radiation Oncology | Admitting: Radiation Oncology

## 2015-02-06 VITALS — BP 151/76 | HR 66 | Temp 97.8°F | Resp 12 | Wt 156.7 lb

## 2015-02-06 DIAGNOSIS — C61 Malignant neoplasm of prostate: Secondary | ICD-10-CM

## 2015-02-06 MED ORDER — HYDROCODONE-ACETAMINOPHEN 5-325 MG PO TABS: 1.0000 | ORAL_TABLET | Freq: Four times a day (QID) | ORAL | Status: DC | PRN

## 2015-02-06 NOTE — Addendum Note (Signed)
Encounter addended by: Jenene Slicker, RN on: 02/06/2015  9:33 AM<BR>     Documentation filed: Vitals Section

## 2015-02-06 NOTE — Progress Notes (Addendum)
Radiation Oncology         [phone]) [phone] ________________________________  Name: Keith Olson. MRN: [id number]  Date: 02/06/2015  DOB: September 19, 1940    Follow-Up Visit Note  CC: Fransisca Kaufmann Dettinger, MD  Rana Snare, MD  Diagnosis:   74 y.o. 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    ICD-9-CM ICD-10-CM   1. Malignant neoplasm of prostate ([facility]) 185 C61     Interval Since Last Radiation:  1  months   Narrative:  The patient returns today for routine follow-up.  He rates his pain as an 8 on a scale of 0-10. constant and dull over severe testicular, prostate and lower abdomen, pain radiates around to his back and down his legs. Started approximately one week ago. Patient complains of fatigue and loss of sleep  He reports urinary urgency, dysuria with the pain in his bladder, hot flashes and dribbling. Patient states they urinate more than 5 times per night. He reports Nausea, Abdominal Pain, Rectal Bleeding and soft bowel movements, once a day. He states the he has pain below his naval deep by his kidneys, which waxes and wanes. He mentions that he sleeps only 3 hours a night. He denies a rash. He takes metamucil twice daily. He is currently taking Flomax before bed once daily. He states that aspirin does not alleviate the pain much. He reports recent testicular pain on both sides.                               ALLERGIES:  is allergic to lipitor.  Meds: Current Outpatient Prescriptions  Medication Sig Dispense Refill  . ALPRAZolam (XANAX) 1 MG tablet Take 0.5-1 tablets (0.5-1 mg total) by mouth 2 (two) times daily as needed for anxiety. 60 tablet 1  . amoxicillin (AMOXIL) 875 MG tablet Take 1 tablet (875 mg total) by mouth 2 (two) times daily. 20 tablet 0  . DULoxetine (CYMBALTA) 30 MG capsule     . DULoxetine (CYMBALTA) 60 MG capsule Take 1  capsule (60 mg total) by mouth daily. (Patient not taking: Reported on 12/27/2014) 60 capsule 3  . fluticasone (FLONASE) 50 MCG/ACT nasal spray Place 2 sprays into both nostrils daily. 16 g 6  . HYDROcodone-acetaminophen (NORCO/VICODIN) 5-325 MG tablet Take 1 tablet by mouth every 6 (six) hours as needed for moderate pain. 45 tablet 0  . hydrocortisone (PROCTOSOL HC) 2.5 % rectal cream Place 1 application rectally 2 (two) times daily. 30 g 0  . tamsulosin (FLOMAX) 0.4 MG CAPS capsule TAKE TWO CAPSULES BY MOUTH ONCE DAILY 60 capsule 1  . traMADol (ULTRAM) 50 MG tablet Take 1 tablet (50 mg total) by mouth every 12 (twelve) hours as needed. 60 tablet 2   No current facility-administered medications for this encounter.    Physical Findings: The patient is in no acute distress. Patient is alert and oriented.  weight is 156 lb 11.2 oz (71.079 kg). His oral temperature is 97.8 F (36.6 C). His blood pressure is 151/76 and his pulse is 66. His respiration is 12 and oxygen saturation is 100%. . Patient has suprapubic tenderness but no palpable distension of the bladder.  Testes descended no hernia.  No significant changes.  Lab Findings: Lab Results  Component Value Date   WBC 6.1 08/26/2014  WBC 8.0 07/24/2014   HGB 12.8* 08/29/2014   HGB 14.9 07/24/2014   HCT 37.6* 08/29/2014   HCT 46.8 07/24/2014   PLT 193 08/26/2014    Lab Results  Component Value Date   NA 140 08/29/2014   NA 141 07/24/2014   K 4.4 08/29/2014   CO2 24 08/29/2014   GLUCOSE 87 08/29/2014   GLUCOSE 93 07/24/2014   BUN 6 08/29/2014   BUN 8 07/24/2014   CREATININE 0.72 08/29/2014   CREATININE 0.79 08/17/2012   BILITOT 0.3 07/24/2014   BILITOT 0.4 08/17/2012   ALKPHOS 69 07/24/2014   AST 23 07/24/2014   ALT 25 07/24/2014   PROT 7.4 07/24/2014   PROT 7.1 08/17/2012   ALBUMIN 4.8 07/24/2014   ALBUMIN 4.1 08/17/2012   CALCIUM 8.3* 08/29/2014   ANIONGAP 7 08/29/2014    Radiographic Findings: No results  found.  Impression:  The patient is recovering from the effects of radiation. He is still actively receiving hormone therapy for prostate cancer.  Plan: He has a follow up appointment with urology on 02/10/15.  _____________________________________  Sheral Apley. Tammi Klippel, M.D.    This document serves as a record of services personally performed by Tyler Pita, MD. It was created on his behalf by Lendon Collar, a trained medical scribe. The creation of this record is based on the scribe's personal observations and the provider's statements to them. This document has been checked and approved by the attending provider.

## 2015-02-06 NOTE — Progress Notes (Signed)
He rates his pain as a 8 on a scale of 0-10. constant and dull over severe testicular, prostate and lower abdomen, pain radiates around to his back and down his legs. Started approximately one week ago. Pt complains of fatigue and loss of sleep Pt reports urinary urgency, pain with urination, hot flashes and dribbling. Pt states they urinate more than 5 times per night.  Pt reports Nausea, Abdominal Pain, Rectal Bleeding and soft bowel movements, once a day  BP 151/76 mmHg  Pulse 66  Temp(Src) 97.8 F (36.6 C) (Oral)  Resp 12  Wt 156 lb 11.2 oz (71.079 kg)  SpO2 100%

## 2015-02-06 NOTE — Addendum Note (Signed)
Encounter addended by: Jenene Slicker, RN on: 02/06/2015  9:54 AM<BR>     Documentation filed: Notes Section, Inpatient Document Flowsheet

## 2015-02-06 NOTE — Addendum Note (Signed)
Encounter addended by: Ernst Spell, RN on: 02/06/2015 10:15 AM<BR>     Documentation filed: Inpatient Document Flowsheet

## 2015-02-07 ENCOUNTER — Inpatient Hospital Stay: Admission: RE | Admit: 2015-02-07 | Payer: Self-pay | Source: Ambulatory Visit | Admitting: Radiation Oncology

## 2015-02-13 ENCOUNTER — Ambulatory Visit: Payer: Medicare Other | Admitting: Radiation Oncology

## 2015-02-25 DIAGNOSIS — R102 Pelvic and perineal pain: Secondary | ICD-10-CM | POA: Diagnosis not present

## 2015-02-25 DIAGNOSIS — C61 Malignant neoplasm of prostate: Secondary | ICD-10-CM | POA: Diagnosis not present

## 2015-02-25 LAB — IFOBT (OCCULT BLOOD): IFOBT: NEGATIVE

## 2015-02-28 ENCOUNTER — Other Ambulatory Visit: Payer: Self-pay | Admitting: *Deleted

## 2015-02-28 MED ORDER — ALPRAZOLAM 1 MG PO TABS: 0.5000 mg | ORAL_TABLET | Freq: Two times a day (BID) | ORAL | Status: DC | PRN

## 2015-02-28 NOTE — Telephone Encounter (Signed)
Last filled 01/31/15, last seen 01/06/15. Route to pool, call in at [place] [facility] today

## 2015-02-28 NOTE — Telephone Encounter (Signed)
Go ahead and refill this time but will have to follow up with Dr. Wendi Snipes next time

## 2015-02-28 NOTE — Telephone Encounter (Signed)
rx called into pharmacy

## 2015-03-11 ENCOUNTER — Ambulatory Visit: Payer: Medicare Other | Admitting: Family Medicine

## 2015-03-12 ENCOUNTER — Encounter: Payer: Medicare Other | Admitting: Internal Medicine

## 2015-04-01 ENCOUNTER — Other Ambulatory Visit: Payer: Self-pay | Admitting: Family Medicine

## 2015-04-01 NOTE — Telephone Encounter (Signed)
Last seen 01/06/15  Dr Dettinger   If approved route to nurse to call into [facility]

## 2015-04-02 NOTE — Telephone Encounter (Signed)
Okay to printed refill. Caryl Pina, MD [facility] 04/02/2015, 7:59 AM

## 2015-04-02 NOTE — Telephone Encounter (Signed)
Refill called to [facility] 

## 2015-04-08 ENCOUNTER — Encounter: Payer: Self-pay | Admitting: Family Medicine

## 2015-04-22 ENCOUNTER — Encounter: Payer: Self-pay | Admitting: Family Medicine

## 2015-04-22 ENCOUNTER — Ambulatory Visit (INDEPENDENT_AMBULATORY_CARE_PROVIDER_SITE_OTHER): Payer: Medicare Other | Admitting: Family Medicine

## 2015-04-22 VITALS — BP 152/81 | HR 50 | Temp 96.7°F | Ht 68.0 in | Wt 158.4 lb

## 2015-04-22 DIAGNOSIS — R1030 Lower abdominal pain, unspecified: Secondary | ICD-10-CM | POA: Insufficient documentation

## 2015-04-22 DIAGNOSIS — K5900 Constipation, unspecified: Secondary | ICD-10-CM | POA: Insufficient documentation

## 2015-04-22 DIAGNOSIS — Z8719 Personal history of other diseases of the digestive system: Secondary | ICD-10-CM | POA: Diagnosis not present

## 2015-04-22 DIAGNOSIS — Z0000 Encounter for general adult medical examination without abnormal findings: Secondary | ICD-10-CM

## 2015-04-22 MED ORDER — ALPRAZOLAM 1 MG PO TABS: 1.0000 mg | ORAL_TABLET | Freq: Two times a day (BID) | ORAL | Status: DC | PRN

## 2015-04-22 NOTE — Progress Notes (Signed)
   HPI  Patient presents today here for lower abdominal pain and refill of Xanax  Lower abdominal pain, accompanied by constipation over the last month or so. He states that at times he does not have a bowel movement for 5-6 days. He also notes worsening pain he associates with his prostate. He is also having hot flashes, he states that the hot flashes and abdominal pain that started after Lupron injections and radiation.  He has stopped Cymbalta about one month ago. He continues to take approximately 1 Xanax daily.  He's taking Metamucil, 1 tablespoon per day. He states he does not have much pain relief after bowel movements.  PMH: Smoking status noted ROS: Per HPI  Objective: BP 152/81 mmHg  Pulse 50  Temp(Src) 96.7 F (35.9 C) (Oral)  Ht '5\' 8"'$  (1.727 m)  Wt 158 lb 7.2 oz (71.872 kg)  BMI 24.10 kg/m2 Gen: NAD, alert, cooperative with exam HEENT: NCAT CV: RRR, good S1/S2, no murmur Resp: CTABL, no wheezes, non-labored Abd: Soft, mild tenderness to palpation in bilateral lower quadrants, positive bowel sounds, no guarding Ext: No edema, warm Neuro: Alert and oriented, No gross deficits  Assessment and plan:  # Abdominal pain Likely constipation related Abdominal exam is reassuring, mild tenderness to palpation and no guarding. Recommended daily miralax and discussed how to titrate to one easy stool per day ranging from one half cap daily to two caps daily Encouraged him to avoid narcotics as this could exacerbate constipation Declined request for pain medications If his pain associated with his prostate persists, we could consider pain treatment for this, but would recommend evaluation with urology prior to starting formal treatment as this would likely be chronic pain treatment and he is at higher risk for abuse considering that he is already on a daily benzodiazepine  # Anxiety Recommended restarting Cymbalta, refilled Xanax at one pill per day, he reports taking about  one pill per day, however he states that he is almost out of pills, he received 60 pills 3 weeks ago. Recommend PCP follow-up in 3-4 weeks  Prostate cancer He is now status post TURP, Lupron injection, and radiation therapy. He's been complaining of similar pain to his urologist since 2014, after detailed review of the urologists note he has some concerns about melena and recommends colonoscopy.  Called and discussed with patient, He had a few episiodes of black sticky stools a few weeks ago, they have resolved. He hasn't ever had a C-Scope but is willing- will refer.      Meds ordered this encounter  Medications  . ALPRAZolam (XANAX) 1 MG tablet    Sig: Take 1 tablet (1 mg total) by mouth 2 (two) times daily as needed for anxiety.    Dispense:  30 tablet    Refill:  0    Keith Apple, MD [facility] 04/22/2015, 11:40 AM

## 2015-04-22 NOTE — Patient Instructions (Signed)
   Start Miralax, 1 capful daily, you should begin to have easier stooling within 1 week.  You may go up to 2 capfuls a day or down to 1 capful every other day to achieve 1 easy stool daily  Avoid narcotics  Re-start cymbalta

## 2015-05-30 ENCOUNTER — Other Ambulatory Visit: Payer: Self-pay | Admitting: Family Medicine

## 2015-06-02 NOTE — Telephone Encounter (Signed)
Last seen 21/17 Dr Wendi Snipes   Dr Dettinger  PCP   If approved route to nurse to call into [facility]

## 2015-06-02 NOTE — Telephone Encounter (Signed)
We can refill for now but patient needs to see me and establish for this issue. I have never actually seen him to discuss this issue. We can set it up like an initial pain contract visit to discuss this. Caryl Pina, MD [facility] 06/02/2015, 9:08 AM

## 2015-06-03 NOTE — Telephone Encounter (Signed)
Appt Made for 4/6 at 12:55

## 2015-06-05 ENCOUNTER — Encounter: Payer: Self-pay | Admitting: Family Medicine

## 2015-06-05 ENCOUNTER — Ambulatory Visit (INDEPENDENT_AMBULATORY_CARE_PROVIDER_SITE_OTHER): Payer: Medicare Other | Admitting: Family Medicine

## 2015-06-05 ENCOUNTER — Encounter: Payer: Self-pay | Admitting: *Deleted

## 2015-06-05 ENCOUNTER — Encounter (INDEPENDENT_AMBULATORY_CARE_PROVIDER_SITE_OTHER): Payer: Self-pay

## 2015-06-05 VITALS — BP 131/60 | HR 53 | Temp 98.0°F | Ht 68.0 in | Wt 160.0 lb

## 2015-06-05 DIAGNOSIS — F32A Depression, unspecified: Secondary | ICD-10-CM

## 2015-06-05 DIAGNOSIS — F411 Generalized anxiety disorder: Secondary | ICD-10-CM

## 2015-06-05 DIAGNOSIS — F329 Major depressive disorder, single episode, unspecified: Secondary | ICD-10-CM

## 2015-06-05 DIAGNOSIS — C61 Malignant neoplasm of prostate: Secondary | ICD-10-CM | POA: Diagnosis not present

## 2015-06-05 DIAGNOSIS — Z79899 Other long term (current) drug therapy: Secondary | ICD-10-CM

## 2015-06-05 DIAGNOSIS — Z0289 Encounter for other administrative examinations: Secondary | ICD-10-CM

## 2015-06-05 MED ORDER — HYDROCODONE-ACETAMINOPHEN 5-325 MG PO TABS
1.0000 | ORAL_TABLET | Freq: Four times a day (QID) | ORAL | Status: DC | PRN
Start: 2015-06-05 — End: 2015-07-07

## 2015-06-05 MED ORDER — DULOXETINE HCL 60 MG PO CPEP: 60.0000 mg | ORAL_CAPSULE | Freq: Every day | ORAL | Status: DC

## 2015-06-05 NOTE — Progress Notes (Signed)
[place] Controlled Substance Abuse database reviewed- Yes  Depression screen [facility] 2/9 06/05/2015 01/06/2015 12/16/2014 12/12/2014 12/05/2014  Decreased Interest 0 2 3 0 0  Down, Depressed, Hopeless '1 3 3 '$ 0 0  PHQ - 2 Score '1 5 6 '$ 0 0  Altered sleeping - 1 1 - -  Tired, decreased energy - 3 3 - -  Change in appetite - 2 0 - -  Feeling bad or failure about yourself  - 2 1 - -  Trouble concentrating - 2 0 - -  Moving slowly or fidgety/restless - 1 0 - -  Suicidal thoughts - 0 0 - -  PHQ-9 Score - 16 11 - -  Difficult doing work/chores - Somewhat difficult - - -    No flowsheet data found.    [employer] drug screen performed- Yes  SOAPP  0= never  1= seldom  2=sometimes  3= often  4= very often  How often do you have mood swings? 3 How often do you smoke a cigarette within an hour after waling up? 0 How often have you taken medication other than the way that it was prescribed?0 How often have you used illegal drugs in the past 5 years? 0 How often, in your lifetime, have you had legal problems or been arrested? 0  Score 3  Alcohol Audit - How often during the last year have found that you: 0-Never   1- Less than monthly   2- Monthly     3-Weekly     4-daily or almost daily  - found that you were not able to stop drinking once you started- 0 -failed to do what was normally expected of you because of drinking- 0 -needed a first drink in the morning- 0 -had a feeling of guilt or remorse after drinking- 0 -are/were unable to remember what happened the night before because of your drinking- 0  0- NO   2- yes but not in last year  4- yes during last year -Have you or someone else been injured because of your drinking- 0 - Has anyone been concerned about your drinking or suggested you cut down- 0        TOTAL- 0  ( 0-7- alcohol education, 8-15- simple advice, 16-19 simple advice plus counseling, 20-40 referral for evaluation and treatment 0   Designated Pharmacy- [facility]  [place]  Pain assessment: Cause of pain- pelvic radiation and prostate Pain location- pelvis Pain on scale of 1-10- 9  Frequency- all of the time What increases pain- sitting and driving What makes pain Better-medication, standing Effects on ADL - none  Prior treatments tried and failed- tramadol Current treatments- tramadol Morphine mg equivalent- 20  Pain management agreement reviewed and signed- Yes   Problem List Items Addressed This Visit      Genitourinary   Malignant neoplasm of prostate ([place]) - Primary   Relevant Medications   HYDROcodone-acetaminophen (NORCO/VICODIN) 5-325 MG tablet   Other Relevant Orders   ToxASSURE Select 13 (MW), Urine     Other   Anxiety state    Would like to continue and restart his Cymbalta. Denies any suicidal ideation or thought of hurting himself.      Relevant Medications   DULoxetine (CYMBALTA) 60 MG capsule    Other Visit Diagnoses    Depression        Relevant Medications    DULoxetine (CYMBALTA) 60 MG capsule    Pain medication agreement        Relevant Orders  ToxASSURE Select 13 (MW), Urine

## 2015-06-09 NOTE — Assessment & Plan Note (Addendum)
Would like to continue and restart his Cymbalta. Denies any suicidal ideation or thought of hurting himself.

## 2015-06-11 LAB — TOXASSURE SELECT 13 (MW), URINE: PDF: 0

## 2015-06-20 DIAGNOSIS — C61 Malignant neoplasm of prostate: Secondary | ICD-10-CM | POA: Diagnosis not present

## 2015-06-27 DIAGNOSIS — R3912 Poor urinary stream: Secondary | ICD-10-CM | POA: Diagnosis not present

## 2015-06-27 DIAGNOSIS — Z0000 Encounter for general adult medical examination without abnormal findings: Secondary | ICD-10-CM | POA: Diagnosis not present

## 2015-06-27 DIAGNOSIS — C61 Malignant neoplasm of prostate: Secondary | ICD-10-CM | POA: Diagnosis not present

## 2015-06-27 DIAGNOSIS — R102 Pelvic and perineal pain: Secondary | ICD-10-CM | POA: Diagnosis not present

## 2015-06-27 DIAGNOSIS — N401 Enlarged prostate with lower urinary tract symptoms: Secondary | ICD-10-CM | POA: Diagnosis not present

## 2015-06-27 DIAGNOSIS — N138 Other obstructive and reflux uropathy: Secondary | ICD-10-CM | POA: Diagnosis not present

## 2015-07-07 ENCOUNTER — Ambulatory Visit (INDEPENDENT_AMBULATORY_CARE_PROVIDER_SITE_OTHER): Payer: Medicare Other | Admitting: Family Medicine

## 2015-07-07 ENCOUNTER — Encounter: Payer: Self-pay | Admitting: Family Medicine

## 2015-07-07 VITALS — BP 140/80 | HR 55 | Temp 97.1°F | Ht 68.0 in | Wt 160.2 lb

## 2015-07-07 DIAGNOSIS — C61 Malignant neoplasm of prostate: Secondary | ICD-10-CM | POA: Diagnosis not present

## 2015-07-07 DIAGNOSIS — F32A Depression, unspecified: Secondary | ICD-10-CM

## 2015-07-07 DIAGNOSIS — F411 Generalized anxiety disorder: Secondary | ICD-10-CM

## 2015-07-07 DIAGNOSIS — F329 Major depressive disorder, single episode, unspecified: Secondary | ICD-10-CM | POA: Diagnosis not present

## 2015-07-07 MED ORDER — DULOXETINE HCL 60 MG PO CPEP: 60.0000 mg | ORAL_CAPSULE | Freq: Every day | ORAL | Status: DC

## 2015-07-07 MED ORDER — HYDROCODONE-ACETAMINOPHEN 5-325 MG PO TABS: 1.0000 | ORAL_TABLET | Freq: Four times a day (QID) | ORAL | Status: DC | PRN

## 2015-07-07 NOTE — Progress Notes (Signed)
BP 140/80 mmHg  Pulse 55  Temp(Src) 97.1 F (36.2 C) (Oral)  Ht '5\' 8"'$  (1.727 m)  Wt 160 lb 3.2 oz (72.666 kg)  BMI 24.36 kg/m2   Subjective:    Patient ID: Keith Toniann Fail., male    DOB: [birth year], 75 y.o.   MRN: [id number]  HPI: Keith Olson. is a 75 y.o. male presenting on 07/07/2015 for Anxiety   HPI Anxiety recheck Patient comes in today for 4 week recheck on his anxiety. He has been taking his Xanax as needed and was prescribed Cymbalta but never picked up the Cymbalta didn't know that it wasn't sent. He says his anxiety is still doing bad and not very well controlled. His anxiety stems from now his daughter who lives with him and has 2 grandchildren who live with him and another one of his own children who lives with family don't work and use drugs and don't take care of their grandchildren and so he and his wife take care of the grandchildren.  Prostate pain Patient is coming in for refill of hydrocortisone because he still has pain from his malignancy and his prostate is causing him issues.  Relevant past medical, surgical, family and social history reviewed and updated as indicated. Interim medical history since our last visit reviewed. Allergies and medications reviewed and updated.  Review of Systems  Constitutional: Negative for fever.  HENT: Negative for congestion, ear discharge, ear pain, rhinorrhea, sinus pressure, sneezing and sore throat.   Eyes: Negative for pain, discharge, redness and visual disturbance.  Respiratory: Negative for cough, chest tightness, shortness of breath and wheezing.   Cardiovascular: Negative for chest pain and leg swelling.  Gastrointestinal: Negative for abdominal pain, diarrhea and constipation.  Genitourinary: Positive for testicular pain. Negative for difficulty urinating.  Musculoskeletal: Negative for back pain and gait problem.  Skin: Negative for rash.  Neurological: Negative for syncope, light-headedness and headaches.    Psychiatric/Behavioral: Negative for suicidal ideas, sleep disturbance and self-injury. The patient is nervous/anxious.   All other systems reviewed and are negative.   Per HPI unless specifically indicated above     Medication List       This list is accurate as of: 07/07/15 11:18 AM.  Always use your most recent med list.               ALPRAZolam 0.25 MG tablet  Commonly known as:  XANAX  Take 0.25 mg by mouth at bedtime.     DULoxetine 60 MG capsule  Commonly known as:  CYMBALTA  Take 1 capsule (60 mg total) by mouth daily.     HYDROcodone-acetaminophen 5-325 MG tablet  Commonly known as:  NORCO/VICODIN  Take 1 tablet by mouth every 6 (six) hours as needed for moderate pain.           Objective:    BP 140/80 mmHg  Pulse 55  Temp(Src) 97.1 F (36.2 C) (Oral)  Ht '5\' 8"'$  (1.727 m)  Wt 160 lb 3.2 oz (72.666 kg)  BMI 24.36 kg/m2  Wt Readings from Last 3 Encounters:  07/07/15 160 lb 3.2 oz (72.666 kg)  06/05/15 160 lb (72.576 kg)  04/22/15 158 lb 7.2 oz (71.872 kg)    Physical Exam  Constitutional: He is oriented to person, place, and time. He appears well-developed and well-nourished. No distress.  HENT:  Right Ear: External ear and ear canal normal.  Left Ear: Tympanic membrane, external ear and ear canal normal.  Nose: Nose normal.  Mouth/Throat:  Oropharynx is clear and moist.  Eyes: Conjunctivae and EOM are normal. Pupils are equal, round, and reactive to light. Right eye exhibits no discharge. No scleral icterus.  Cardiovascular: Normal rate, regular rhythm, normal heart sounds and intact distal pulses.   No murmur heard. Pulmonary/Chest: Effort normal and breath sounds normal. No respiratory distress. He has no wheezes.  Musculoskeletal: Normal range of motion. He exhibits no edema.  Neurological: He is alert and oriented to person, place, and time. Coordination normal.  Skin: Skin is warm and dry. No rash noted. He is not diaphoretic.  Psychiatric: His  behavior is normal. Judgment and thought content normal. His mood appears anxious. He exhibits a depressed mood. He expresses no suicidal ideation. He expresses no suicidal plans.  Vitals reviewed.     Assessment & Plan:   Problem List Items Addressed This Visit      Genitourinary   Malignant neoplasm of prostate ([place])   Relevant Medications   ALPRAZolam (XANAX) 0.25 MG tablet   HYDROcodone-acetaminophen (NORCO/VICODIN) 5-325 MG tablet     Other   Anxiety state - Primary   Relevant Medications   ALPRAZolam (XANAX) 0.25 MG tablet   DULoxetine (CYMBALTA) 60 MG capsule    Other Visit Diagnoses    Depression        Relevant Medications    ALPRAZolam (XANAX) 0.25 MG tablet    DULoxetine (CYMBALTA) 60 MG capsule         Follow up plan: Return in about 4 weeks (around 08/04/2015), or if symptoms worsen or fail to improve, for Recheck depression and anxiety.  Counseling provided for all of the vaccine components No orders of the defined types were placed in this encounter.    Caryl Pina, MD [facility] 07/07/2015, 11:18 AM

## 2015-08-11 ENCOUNTER — Ambulatory Visit (INDEPENDENT_AMBULATORY_CARE_PROVIDER_SITE_OTHER): Payer: Medicare Other | Admitting: Family Medicine

## 2015-08-11 ENCOUNTER — Encounter: Payer: Self-pay | Admitting: Family Medicine

## 2015-08-11 VITALS — BP 155/76 | HR 61 | Temp 97.2°F | Ht 68.0 in | Wt 158.2 lb

## 2015-08-11 DIAGNOSIS — J0100 Acute maxillary sinusitis, unspecified: Secondary | ICD-10-CM | POA: Diagnosis not present

## 2015-08-11 DIAGNOSIS — G43919 Migraine, unspecified, intractable, without status migrainosus: Secondary | ICD-10-CM | POA: Diagnosis not present

## 2015-08-11 MED ORDER — AMOXICILLIN-POT CLAVULANATE 875-125 MG PO TABS: 1.0000 | ORAL_TABLET | Freq: Two times a day (BID) | ORAL | Status: DC

## 2015-08-11 MED ORDER — METHYLPREDNISOLONE ACETATE 80 MG/ML IJ SUSP
80.0000 mg | Freq: Once | INTRAMUSCULAR | Status: AC
  Administered 2015-08-11: 80 mg via INTRAMUSCULAR

## 2015-08-11 NOTE — Patient Instructions (Signed)
Great to see you!  I am treating you for migraine headache and sinusitis. If you do not see good improvement in a few days please let us know. If your headache is not clearly improving tomorrow let me know.   Look over the signs of stroke I gave you, if any of this happens go straight to the ER.   Migraine Headache A migraine headache is an intense, throbbing pain on one or both sides of your head. A migraine can last for 30 minutes to several hours. CAUSES  The exact cause of a migraine headache is not always known. However, a migraine may be caused when nerves in the brain become irritated and release chemicals that cause inflammation. This causes pain. Certain things may also trigger migraines, such as:  Alcohol.  Smoking.  Stress.  Menstruation.  Aged cheeses.  Foods or drinks that contain nitrates, glutamate, aspartame, or tyramine.  Lack of sleep.  Chocolate.  Caffeine.  Hunger.  Physical exertion.  Fatigue.  Medicines used to treat chest pain (nitroglycerine), birth control pills, estrogen, and some blood pressure medicines. SIGNS AND SYMPTOMS  Pain on one or both sides of your head.  Pulsating or throbbing pain.  Severe pain that prevents daily activities.  Pain that is aggravated by any physical activity.  Nausea, vomiting, or both.  Dizziness.  Pain with exposure to bright lights, loud noises, or activity.  General sensitivity to bright lights, loud noises, or smells. Before you get a migraine, you may get warning signs that a migraine is coming (aura). An aura may include:  Seeing flashing lights.  Seeing bright spots, halos, or zigzag lines.  Having tunnel vision or blurred vision.  Having feelings of numbness or tingling.  Having trouble talking.  Having muscle weakness. DIAGNOSIS  A migraine headache is often diagnosed based on:  Symptoms.  Physical exam.  A CT scan or MRI of your head. These imaging tests cannot diagnose  migraines, but they can help rule out other causes of headaches. TREATMENT Medicines may be given for pain and nausea. Medicines can also be given to help prevent recurrent migraines.  HOME CARE INSTRUCTIONS  Only take over-the-counter or prescription medicines for pain or discomfort as directed by your health care provider. The use of long-term narcotics is not recommended.  Lie down in a dark, quiet room when you have a migraine.  Keep a journal to find out what may trigger your migraine headaches. For example, write down:  What you eat and drink.  How much sleep you get.  Any change to your diet or medicines.  Limit alcohol consumption.  Quit smoking if you smoke.  Get 7-9 hours of sleep, or as recommended by your health care provider.  Limit stress.  Keep lights dim if bright lights bother you and make your migraines worse. SEEK IMMEDIATE MEDICAL CARE IF:   Your migraine becomes severe.  You have a fever.  You have a stiff neck.  You have vision loss.  You have muscular weakness or loss of muscle control.  You start losing your balance or have trouble walking.  You feel faint or pass out.  You have severe symptoms that are different from your first symptoms. MAKE SURE YOU:   Understand these instructions.  Will watch your condition.  Will get help right away if you are not doing well or get worse.   This information is not intended to replace advice given to you by your health care provider. Make sure you  discuss any questions you have with your health care provider.   Document Released: 02/15/2005 Document Revised: 03/08/2014 Document Reviewed: 10/23/2012 Elsevier Interactive Patient Education 2016 Elsevier Inc.  Sinusitis, Adult Sinusitis is redness, soreness, and puffiness (inflammation) of the air pockets in the bones of your face (sinuses). The redness, soreness, and puffiness can cause air and mucus to get trapped in your sinuses. This can allow  germs to grow and cause an infection.  HOME CARE   Drink enough fluids to keep your pee (urine) clear or pale yellow.  Use a humidifier in your home.  Run a hot shower to create steam in the bathroom. Sit in the bathroom with the door closed. Breathe in the steam 3-4 times a day.  Put a warm, moist washcloth on your face 3-4 times a day, or as told by your doctor.  Use salt water sprays (saline sprays) to wet the thick fluid in your nose. This can help the sinuses drain.  Only take medicine as told by your doctor. GET HELP RIGHT AWAY IF:   Your pain gets worse.  You have very bad headaches.  You are sick to your stomach (nauseous).  You throw up (vomit).  You are very sleepy (drowsy) all the time.  Your face is puffy (swollen).  Your vision changes.  You have a stiff neck.  You have trouble breathing. MAKE SURE YOU:   Understand these instructions.  Will watch your condition.  Will get help right away if you are not doing well or get worse.   This information is not intended to replace advice given to you by your health care provider. Make sure you discuss any questions you have with your health care provider.   Document Released: 08/04/2007 Document Revised: 03/08/2014 Document Reviewed: 09/21/2011 Elsevier Interactive Patient Education [employer].

## 2015-08-11 NOTE — Progress Notes (Signed)
   HPI  Patient presents today here with headache and sinus pressure.  Patient's lines of the last 3 days he's had severe headache. For the first 2 days of his very severe, today it's improving. He describes it as a bitemporal throbbing type headache behind his eyes. States associated with nausea but no vomiting. He has no difficulty talking, walking, or weakness or numbness. He states that it's consistent with his usual migraines but more severe.  Sinus pressure and cough. He states he's had chest congestion, cough, mild shortness of breath, and sinus pressure over the last 2 weeks. He took some sinus medication today with mild improvement.  He's tried over-the-counter pain medications for the headache with no improvement.  PMH: Smoking status noted ROS: Per HPI  Objective: BP 155/76 mmHg  Pulse 61  Temp(Src) 97.2 F (36.2 C) (Oral)  Ht '5\' 8"'$  (1.727 m)  Wt 158 lb 3.2 oz (71.759 kg)  BMI 24.06 kg/m2 Gen: NAD, alert, cooperative with exam HEENT: NCAT, left-sided Tenderness to palpation over the maxillary sinus, TMs normal bilaterally, nares clear, oropharynx clear, EOMI, PERRLA CV: RRR, good S1/S2, no murmur Resp: Some coarse breath sounds scattered, nonlabored Ext: No edema, warm Neuro: Alert and oriented, No gross deficits  Assessment and plan:  # Migraine headache Resolving Discussed signs of stroke and reasons to return for care or seek ed care, he understands  # Acute sinusitis Treat with Augmentin and IM Depo-Medrol. I believe Depo-Medrol will also have some benefit with migraine headache Return to clinic if not improving rapidly, 2-4 days for sinus pressure   If headache not improving tomorrow or suddenly worsening recommended calling or seek emergency medical care respectively.    Meds ordered this encounter  Medications  . methylPREDNISolone acetate (DEPO-MEDROL) injection 80 mg    Sig:   . amoxicillin-clavulanate (AUGMENTIN) 875-125 MG tablet    Sig: Take  1 tablet by mouth 2 (two) times daily.    Dispense:  20 tablet    Refill:  0    Laroy Apple, MD [facility] 08/11/2015, 3:01 PM

## 2015-08-13 ENCOUNTER — Other Ambulatory Visit: Payer: Self-pay | Admitting: Family Medicine

## 2015-08-13 NOTE — Telephone Encounter (Signed)
Patient needs follow-up appointment and we will discuss that at that point

## 2015-08-13 NOTE — Telephone Encounter (Signed)
Pt notified will need to be seen to discuss appt scheduled

## 2015-08-13 NOTE — Telephone Encounter (Signed)
Patient requesting script for Xanax.  Please advise.

## 2015-08-14 ENCOUNTER — Ambulatory Visit (INDEPENDENT_AMBULATORY_CARE_PROVIDER_SITE_OTHER): Payer: Medicare Other | Admitting: Family Medicine

## 2015-08-14 ENCOUNTER — Encounter: Payer: Self-pay | Admitting: Family Medicine

## 2015-08-14 VITALS — BP 135/70 | HR 57 | Temp 97.0°F | Ht 68.0 in | Wt 158.0 lb

## 2015-08-14 DIAGNOSIS — F411 Generalized anxiety disorder: Secondary | ICD-10-CM

## 2015-08-14 MED ORDER — TRAZODONE HCL 50 MG PO TABS: 25.0000 mg | ORAL_TABLET | Freq: Every evening | ORAL | Status: DC | PRN

## 2015-08-14 MED ORDER — ALPRAZOLAM 0.25 MG PO TABS: 0.2500 mg | ORAL_TABLET | Freq: Every day | ORAL | Status: DC

## 2015-08-14 MED ORDER — DULOXETINE HCL 30 MG PO CPEP: 90.0000 mg | ORAL_CAPSULE | Freq: Every day | ORAL | Status: DC

## 2015-08-14 NOTE — Progress Notes (Signed)
BP 135/70 mmHg  Pulse 57  Temp(Src) 97 F (36.1 C) (Oral)  Ht '5\' 8"'$  (1.727 m)  Wt 158 lb (71.668 kg)  BMI 24.03 kg/m2   Subjective:    Patient ID: Keith Toniann Fail., male    DOB: January 29, 1941, 75 y.o.   MRN: [id number]  HPI: Keith Zyon Rosser. is a 75 y.o. male presenting on 08/14/2015 for Anxiety   HPI Anxiety recheck Patient is coming in today for an anxiety recheck. He is currently on Cymbalta 60 mg and takes Xanax as needed. He says his biggest issue is he still not sleeping at night and has insomnia. He also says he is having some anxiety still throughout the day especially taking care of grandkids and his daughter who is into drugs. He denies any suicidal ideations or thoughts of hurting himself. He denies any side effects from the medication.  Relevant past medical, surgical, family and social history reviewed and updated as indicated. Interim medical history since our last visit reviewed. Allergies and medications reviewed and updated.  Review of Systems  Constitutional: Negative for fever.  HENT: Negative for ear discharge and ear pain.   Eyes: Negative for discharge and visual disturbance.  Respiratory: Negative for shortness of breath and wheezing.   Cardiovascular: Negative for chest pain and leg swelling.  Gastrointestinal: Negative for abdominal pain, diarrhea and constipation.  Genitourinary: Negative for difficulty urinating.  Musculoskeletal: Negative for back pain and gait problem.  Skin: Negative for rash.  Neurological: Negative for syncope, light-headedness and headaches.  Psychiatric/Behavioral: Positive for sleep disturbance and dysphoric mood. Negative for suicidal ideas, self-injury and agitation. The patient is nervous/anxious.   All other systems reviewed and are negative.   Per HPI unless specifically indicated above     Medication List       This list is accurate as of: 08/14/15  1:37 PM.  Always use your most recent med list.               ALPRAZolam 0.25 MG tablet  Commonly known as:  XANAX  Take 1 tablet (0.25 mg total) by mouth at bedtime.     amoxicillin-clavulanate 875-125 MG tablet  Commonly known as:  AUGMENTIN  Take 1 tablet by mouth 2 (two) times daily.     DULoxetine 30 MG capsule  Commonly known as:  CYMBALTA  Take 3 capsules (90 mg total) by mouth daily.     traZODone 50 MG tablet  Commonly known as:  DESYREL  Take 0.5-1 tablets (25-50 mg total) by mouth at bedtime as needed for sleep.           Objective:    BP 135/70 mmHg  Pulse 57  Temp(Src) 97 F (36.1 C) (Oral)  Ht '5\' 8"'$  (1.727 m)  Wt 158 lb (71.668 kg)  BMI 24.03 kg/m2  Wt Readings from Last 3 Encounters:  08/14/15 158 lb (71.668 kg)  08/11/15 158 lb 3.2 oz (71.759 kg)  07/07/15 160 lb 3.2 oz (72.666 kg)    Physical Exam  Constitutional: He is oriented to person, place, and time. He appears well-developed and well-nourished. No distress.  Eyes: Conjunctivae and EOM are normal. Pupils are equal, round, and reactive to light. Right eye exhibits no discharge. No scleral icterus.  Neck: Neck supple. No thyromegaly present.  Cardiovascular: Normal rate, regular rhythm, normal heart sounds and intact distal pulses.   No murmur heard. Pulmonary/Chest: Effort normal and breath sounds normal. No respiratory distress. He has no wheezes.  Musculoskeletal: Normal  range of motion. He exhibits no edema.  Lymphadenopathy:    He has no cervical adenopathy.  Neurological: He is alert and oriented to person, place, and time. Coordination normal.  Skin: Skin is warm and dry. No rash noted. He is not diaphoretic.  Psychiatric: His behavior is normal. Judgment and thought content normal. His mood appears anxious. He exhibits a depressed mood. He expresses no suicidal ideation. He expresses no suicidal plans.  Nursing note and vitals reviewed.       Assessment & Plan:   Problem List Items Addressed This Visit      Other   Anxiety state - Primary    Relevant Medications   traZODone (DESYREL) 50 MG tablet   DULoxetine (CYMBALTA) 30 MG capsule   ALPRAZolam (XANAX) 0.25 MG tablet       Follow up plan: Return in about 4 weeks (around 09/11/2015), or if symptoms worsen or fail to improve, for Recheck anxiety.  Counseling provided for all of the vaccine components No orders of the defined types were placed in this encounter.    Caryl Pina, MD [facility] 08/14/2015, 1:37 PM

## 2015-08-19 ENCOUNTER — Telehealth: Payer: Self-pay

## 2015-08-19 NOTE — Telephone Encounter (Signed)
Insurance authorized Duloxetine through 02/29/16

## 2015-08-29 ENCOUNTER — Encounter: Payer: Self-pay | Admitting: Family Medicine

## 2015-08-29 ENCOUNTER — Ambulatory Visit (INDEPENDENT_AMBULATORY_CARE_PROVIDER_SITE_OTHER): Payer: Medicare Other | Admitting: Family Medicine

## 2015-08-29 VITALS — BP 132/71 | HR 50 | Temp 97.1°F | Ht 68.0 in | Wt 155.6 lb

## 2015-08-29 DIAGNOSIS — R109 Unspecified abdominal pain: Secondary | ICD-10-CM

## 2015-08-29 DIAGNOSIS — R300 Dysuria: Secondary | ICD-10-CM

## 2015-08-29 DIAGNOSIS — J019 Acute sinusitis, unspecified: Secondary | ICD-10-CM

## 2015-08-29 DIAGNOSIS — F411 Generalized anxiety disorder: Secondary | ICD-10-CM | POA: Diagnosis not present

## 2015-08-29 DIAGNOSIS — Z79891 Long term (current) use of opiate analgesic: Secondary | ICD-10-CM | POA: Diagnosis not present

## 2015-08-29 LAB — URINALYSIS, COMPLETE
Bilirubin, UA: NEGATIVE
Glucose, UA: NEGATIVE
Ketones, UA: NEGATIVE
Leukocytes, UA: NEGATIVE
Nitrite, UA: NEGATIVE
Protein, UA: NEGATIVE
RBC, UA: NEGATIVE
Specific Gravity, UA: 1.01 (ref 1.005–1.030)
Urobilinogen, Ur: 0.2 mg/dL (ref 0.2–1.0)
pH, UA: 6 (ref 5.0–7.5)

## 2015-08-29 LAB — MICROSCOPIC EXAMINATION
Bacteria, UA: NONE SEEN
Epithelial Cells (non renal): NONE SEEN /hpf (ref 0–10)
RBC, UA: NONE SEEN /hpf (ref 0–?)
WBC, UA: NONE SEEN /hpf (ref 0–?)

## 2015-08-29 MED ORDER — FLUTICASONE PROPIONATE 50 MCG/ACT NA SUSP: 1.0000 | Freq: Two times a day (BID) | NASAL | Status: DC | PRN

## 2015-08-29 MED ORDER — KETOROLAC TROMETHAMINE 30 MG/ML IJ SOLN
30.0000 mg | Freq: Once | INTRAMUSCULAR | Status: AC
  Administered 2015-08-29: 30 mg via INTRAMUSCULAR

## 2015-08-29 NOTE — Progress Notes (Signed)
BP 132/71 mmHg  Pulse 50  Temp(Src) 97.1 F (36.2 C) (Oral)  Ht '5\' 8"'$  (1.727 m)  Wt 155 lb 9.6 oz (70.58 kg)  BMI 23.66 kg/m2   Subjective:    Patient ID: Keith Toniann Fail., male    DOB: March 05, 1940, 75 y.o.   MRN: [id number]  HPI: Keith Olson. is a 75 y.o. male presenting on 08/29/2015 for Urinary Tract Infection and Chest congestion   HPI flank pain Patient is coming in today for flank pain. This all started about 2-3 days ago. He does complain that he is having some urinary retention. He has known malignant neoplasm of the prostate and prostate enlargement which has cause obstruction previously. He does have a urologist that he follows up with regularly. He was concerned with the flank pain, worse over the past couple days that he might be getting an infection. He denies any fevers or chills or abdominal pain. He denies any dysuria or hematuria.  Sinus congestion and cough Patient has had a mild sinus congestion and cough has been going on for the past 3 or 4 days. He denies any fevers or chills. His cough is mostly nonproductive and dry. He has been having some postnasal drainage of the cough is worse in the early morning. Has used Tylenol Sinus and cold and Advil but they have not helped much. He denies any sick contacts that he knows of.  Anxiety Patient is having a bit of anxiety still but is improved from where as previously. He is using the Xanax at night which is helping him sleep. He was prescribed trazodone as well as from sleep at night. He was given Cymbalta last and is here and he took it for 2 days and it made him feel like a zombie so he stopped taking it. He denies any suicidal ideations or thoughts of hurting himself.  Relevant past medical, surgical, family and social history reviewed and updated as indicated. Interim medical history since our last visit reviewed. Allergies and medications reviewed and updated.  Review of Systems  Constitutional: Negative for  fever and chills.  HENT: Positive for congestion, postnasal drip, rhinorrhea, sinus pressure, sneezing and sore throat. Negative for ear discharge, ear pain and voice change.   Eyes: Negative for pain, discharge, redness and visual disturbance.  Respiratory: Negative for shortness of breath and wheezing.   Cardiovascular: Negative for chest pain and leg swelling.  Gastrointestinal: Negative for abdominal pain, diarrhea and constipation.  Genitourinary: Positive for flank pain and decreased urine volume. Negative for dysuria, urgency, frequency, hematuria, scrotal swelling, difficulty urinating and testicular pain.  Musculoskeletal: Negative for back pain and gait problem.  Skin: Negative for rash.  Neurological: Negative for syncope, light-headedness and headaches.  Psychiatric/Behavioral: Negative for suicidal ideas, confusion, sleep disturbance, self-injury, dysphoric mood and decreased concentration. The patient is nervous/anxious.   All other systems reviewed and are negative.   Per HPI unless specifically indicated above     Medication List       This list is accurate as of: 08/29/15 10:12 AM.  Always use your most recent med list.               ALPRAZolam 0.25 MG tablet  Commonly known as:  XANAX  Take 1 tablet (0.25 mg total) by mouth at bedtime.     traZODone 50 MG tablet  Commonly known as:  DESYREL  Take 0.5-1 tablets (25-50 mg total) by mouth at bedtime as needed for sleep.  Objective:    BP 132/71 mmHg  Pulse 50  Temp(Src) 97.1 F (36.2 C) (Oral)  Ht '5\' 8"'$  (1.727 m)  Wt 155 lb 9.6 oz (70.58 kg)  BMI 23.66 kg/m2  Wt Readings from Last 3 Encounters:  08/29/15 155 lb 9.6 oz (70.58 kg)  08/14/15 158 lb (71.668 kg)  08/11/15 158 lb 3.2 oz (71.759 kg)    Physical Exam  Constitutional: He is oriented to person, place, and time. He appears well-developed and well-nourished. No distress.  HENT:  Right Ear: Tympanic membrane, external ear and ear  canal normal.  Left Ear: Tympanic membrane, external ear and ear canal normal.  Nose: Mucosal edema and rhinorrhea present. No sinus tenderness. No epistaxis. Right sinus exhibits no maxillary sinus tenderness and no frontal sinus tenderness. Left sinus exhibits no maxillary sinus tenderness and no frontal sinus tenderness.  Mouth/Throat: Uvula is midline and mucous membranes are normal. Posterior oropharyngeal edema and posterior oropharyngeal erythema present. No oropharyngeal exudate or tonsillar abscesses.  Eyes: Conjunctivae and EOM are normal. Pupils are equal, round, and reactive to light. Right eye exhibits no discharge. No scleral icterus.  Neck: Neck supple. No thyromegaly present.  Cardiovascular: Normal rate, regular rhythm, normal heart sounds and intact distal pulses.   No murmur heard. Pulmonary/Chest: Effort normal and breath sounds normal. No respiratory distress. He has no wheezes. He has no rales.  Abdominal: Soft. Normal appearance and bowel sounds are normal. There is no hepatosplenomegaly. There is no tenderness. There is CVA tenderness (Left). There is no rigidity, no guarding, no tenderness at McBurney's point and negative Murphy's sign. No hernia.  Musculoskeletal: Normal range of motion. He exhibits no edema.  Lymphadenopathy:    He has no cervical adenopathy.  Neurological: He is alert and oriented to person, place, and time. Coordination normal.  Skin: Skin is warm and dry. No rash noted. He is not diaphoretic.  Psychiatric: He has a normal mood and affect. His behavior is normal.  Nursing note and vitals reviewed.     Assessment & Plan:   Problem List Items Addressed This Visit      Other   Anxiety state    Felt like a zombie for 2 days and then stopped it. Recommended for him to try it for at least a week and that sensation will go away. He will try again.      Relevant Orders   ToxASSURE Select 13 (MW), Urine    Other Visit Diagnoses    Dysuria    -   Primary    Negative urinalysis    Relevant Medications    ketorolac (TORADOL) 30 MG/ML injection 30 mg (Completed)    Other Relevant Orders    Urinalysis, Complete (Completed)    Flank pain        Concern for hydronephrosis because of prostate issues. Recommended for him to call his urologist and go to see them sooner rather than later    Relevant Medications    ketorolac (TORADOL) 30 MG/ML injection 30 mg (Completed)    Acute rhinosinusitis        Relevant Medications    fluticasone (FLONASE) 50 MCG/ACT nasal spray        Follow up plan: Return if symptoms worsen or fail to improve.  Counseling provided for all of the vaccine components Orders Placed This Encounter  Procedures  . Urinalysis, Complete  . ToxASSURE Select 13 (MW), Urine    Caryl Pina, MD [facility] 08/29/2015, 10:12 AM

## 2015-08-29 NOTE — Assessment & Plan Note (Signed)
Felt like a zombie for 2 days and then stopped it. Recommended for him to try it for at least a week and that sensation will go away. He will try again.

## 2015-09-05 LAB — TOXASSURE SELECT 13 (MW), URINE: PDF: 0

## 2015-10-10 ENCOUNTER — Encounter: Payer: Self-pay | Admitting: Family

## 2015-10-10 ENCOUNTER — Ambulatory Visit (INDEPENDENT_AMBULATORY_CARE_PROVIDER_SITE_OTHER): Payer: Medicare Other | Admitting: Family

## 2015-10-10 VITALS — BP 129/72 | HR 58 | Ht 68.0 in | Wt 155.0 lb

## 2015-10-10 DIAGNOSIS — R0789 Other chest pain: Secondary | ICD-10-CM

## 2015-10-10 DIAGNOSIS — Z951 Presence of aortocoronary bypass graft: Secondary | ICD-10-CM

## 2015-10-10 DIAGNOSIS — R0602 Shortness of breath: Secondary | ICD-10-CM

## 2015-10-10 NOTE — Patient Instructions (Signed)

## 2015-10-10 NOTE — Progress Notes (Signed)
   Subjective:    Patient ID: Keith Toniann Fail., male    DOB: 10/08/1940, 75 y.o.   MRN: [id number]  Pt presents to the office today with SOB and chest tightness. Pt had CABG in 2001. Shortness of Breath  This is a new problem. The current episode started in the past 7 days. The problem occurs intermittently. The problem has been unchanged. Associated symptoms include chest pain, headaches and leg pain. Pertinent negatives include no ear pain, fever, leg swelling, orthopnea, sore throat or wheezing. The symptoms are aggravated by any activity. He has tried rest for the symptoms. The treatment provided mild relief. His past medical history is significant for CAD.      Review of Systems  Constitutional: Negative for fever.  HENT: Negative for ear pain and sore throat.   Respiratory: Positive for shortness of breath. Negative for wheezing.   Cardiovascular: Positive for chest pain. Negative for orthopnea and leg swelling.  Neurological: Positive for headaches.  All other systems reviewed and are negative.      Objective:   Physical Exam  Constitutional: He is oriented to person, place, and time. He appears well-developed and well-nourished. No distress.  HENT:  Head: Normocephalic.  Left Ear: External ear normal.  Eyes: Pupils are equal, round, and reactive to light. Right eye exhibits no discharge. Left eye exhibits no discharge.  Neck: Normal range of motion. Neck supple. No thyromegaly present.  Cardiovascular: Normal rate, regular rhythm, normal heart sounds and intact distal pulses.   No murmur heard. Pulmonary/Chest: Effort normal and breath sounds normal. No respiratory distress. He has no wheezes.  Abdominal: Soft. Bowel sounds are normal. He exhibits no distension. There is no tenderness.  Musculoskeletal: Normal range of motion. He exhibits no edema or tenderness.  Neurological: He is alert and oriented to person, place, and time. He has normal reflexes. No cranial nerve  deficit.  Skin: Skin is warm and dry. No rash noted. No erythema.  Psychiatric: He has a normal mood and affect. His behavior is normal. Judgment and thought content normal.  Vitals reviewed.  EKG - Normal sinus rhythm    BP 129/72   Pulse (!) 58   Ht '5\' 8"'$  (1.727 m)   Wt 155 lb (70.3 kg)   SpO2 99%   BMI 23.57 kg/m      Assessment & Plan:  1. SOB (shortness of breath) - EKG 12-Lead - Ambulatory referral to Cardiology  2. Chest tightness - EKG 12-Lead - Ambulatory referral to Cardiology  3. Hx of CABG - Ambulatory referral to Cardiology  Stress management  Follow up with Cardiology  Encourage diet and exercise RTO prn and keep chronic follow up appt  Evelina Dun, FNP

## 2015-10-24 DIAGNOSIS — C61 Malignant neoplasm of prostate: Secondary | ICD-10-CM | POA: Diagnosis not present

## 2015-10-31 DIAGNOSIS — C61 Malignant neoplasm of prostate: Secondary | ICD-10-CM | POA: Diagnosis not present

## 2015-10-31 DIAGNOSIS — N401 Enlarged prostate with lower urinary tract symptoms: Secondary | ICD-10-CM | POA: Diagnosis not present

## 2015-10-31 DIAGNOSIS — R3912 Poor urinary stream: Secondary | ICD-10-CM | POA: Diagnosis not present

## 2015-12-08 ENCOUNTER — Encounter: Payer: Self-pay | Admitting: Cardiology

## 2015-12-08 NOTE — Progress Notes (Signed)
.    Cardiology Office Note  Date: 12/09/2015   ID: Keith Toniann Fail., DOB 16-Apr-1940, MRN [id number]  PCP: Worthy Rancher, MD  Referring Provider: Ouida Sills, NP  Consulting Cardiologist: Rozann Lesches, MD   Chief Complaint  Patient presents with  . Coronary Artery Disease  . Chest discomfort  . Shortness of Breath    History of Present Illness: Keith Averie Meiner. is a 75 y.o. male referred for cardiology consultation by Ms. Hawks NP. I reviewed records and updated the chart. He is here today with his wife. He states that about one month ago he developed an episode of severe left chest pain with discomfort in the left arm. He had nitroglycerin available but did not take it, decided to take a nap instead. Eventually the symptoms resolved after a few hours, but he has had recurring chest tightness since that time, about every 2 or 3 days, often with activity. Also feels more short of breath just walking in his yard or across a parking lot. These symptoms are new within the last month. They remind him of prior angina from years ago.  Cardiac history includes placement of BMS to the mid LAD in 2001 with our practice, more recently evaluation by the [facility] cardiology practice, seen in 2014 with follow-up cardiac catheterization revealing patent stent site and otherwise mild nonobstructive disease.  He is on limited medical therapy, essentially has nitroglycerin available, takes aspirin intermittently. He reports prior significant intolerance to Lipitor due to muscle aching and weakness. He states he was able to take Crestor, but has not been on any statin recently. No recent lipid numbers available.  He also reports having trouble with significant anxiety, he uses Xanax intermittently. He and his wife are raising 2 grandchildren at home.  Past Medical History:  Diagnosis Date  . Agent orange exposure   . Arthritis   . Coronary artery disease    BMS to mid LAD 2001  . Enlarged  prostate    XRT 2016  . Headache    Sinus headaches   . History of depression   . Hyperlipidemia   . Prostate cancer ([place])   . PTSD (post-traumatic stress disorder)     Past Surgical History:  Procedure Laterality Date  . HERNIA REPAIR Right   . PROSTATE BIOPSY N/A 08/28/2014   Procedure: BIOPSY TRANSRECTAL ULTRASONIC PROSTATE (TUBP);  Surgeon: Rana Snare, MD;  Location: [facility];  Service: Urology;  Laterality: N/A;  . TRANSURETHRAL RESECTION OF PROSTATE N/A 08/28/2014   Procedure: TRANSURETHRAL RESECTION OF THE PROSTATE WITH GYRUS INSTRUMENTS;  Surgeon: Rana Snare, MD;  Location: [facility];  Service: Urology;  Laterality: N/A;    Current Outpatient Prescriptions  Medication Sig Dispense Refill  . ALPRAZolam (XANAX) 0.25 MG tablet Take 1 tablet (0.25 mg total) by mouth at bedtime. 30 tablet 0  . fluticasone (FLONASE) 50 MCG/ACT nasal spray Place 1 spray into both nostrils 2 (two) times daily as needed for allergies or rhinitis. 16 g 6  . levothyroxine (SYNTHROID, LEVOTHROID) 75 MCG tablet Take 75 mcg by mouth daily before breakfast.    . nitroGLYCERIN (NITROSTAT) 0.4 MG SL tablet Place 0.4 mg under the tongue every 5 (five) minutes as needed for chest pain.    . tamsulosin (FLOMAX) 0.4 MG CAPS capsule Take 0.4 mg by mouth 2 (two) times daily.    Marland Kitchen aspirin EC 81 MG tablet Take 1 tablet (81 mg total) by mouth daily.     No current facility-administered  medications for this visit.    Allergies:  Lipitor [atorvastatin]   Social History: The patient  reports that he has never smoked. He has quit using smokeless tobacco. His smokeless tobacco use included Chew. He reports that he does not drink alcohol or use drugs.   Family History: The patient's family history includes Arthritis in his mother.   ROS:  Please see the history of present illness. Otherwise, complete review of systems is positive for anxiety.  All other systems are reviewed and negative.   Physical Exam: VS:  BP 138/78    Pulse (!) 59   Ht '5\' 8"'$  (1.727 m)   Wt 158 lb (71.7 kg)   SpO2 98%   BMI 24.02 kg/m , BMI Body mass index is 24.02 kg/m.  Wt Readings from Last 3 Encounters:  12/09/15 158 lb (71.7 kg)  10/10/15 155 lb (70.3 kg)  08/29/15 155 lb 9.6 oz (70.6 kg)    General: Patient appears comfortable at rest. HEENT: Conjunctiva and lids normal, oropharynx clear. Neck: Supple, no elevated JVP or carotid bruits, no thyromegaly. Lungs: Clear to auscultation, nonlabored breathing at rest. Cardiac: Regular rate and rhythm, no S3 or significant systolic murmur, no pericardial rub. Abdomen: Soft, nontender, bowel sounds present, no guarding or rebound. Extremities: No pitting edema, distal pulses 2+. Skin: Warm and dry. Musculoskeletal: No kyphosis. Neuropsychiatric: Alert and oriented x3, affect grossly appropriate.  ECG: I personally reviewed the tracing from 10/10/2015 which showed sinus bradycardia.  Recent Labwork:  June 2016: Potassium 4.4, BUN 6, creatinine 0.7, hemoglobin 13.9, platelets 193   Other Studies Reviewed Today:  Cardiac catheterization 03/02/2012 ([facility]): SELECTIVE ANGIOGRAPHY RESULTS: The right coronary artery has scattered 20 to 30% lesions noted in the proximal and mid-portion and distal. It is a large dominant vessel. The left coronary artery has a typical anatomy. The LAD is a large vessel that wraps the apex. It does have a stent in the mid-portion that has 30% in-stent stenosis noted. There is good flow throughout the vessel. The circumflex artery has a small OM1 and a small distal OM2. It is relatively disease free. HEMODYNAMICS: His LVEDP was 12 mmHg. A pullback showed no significant gradient. An LV gram showed an EF of 60 to 65% with no regional wall motion abnormalities.  Assessment and Plan:  1. Dyspnea on exertion and recurring chest discomfort concerning for accelerating angina. Patient has known CAD status post BMS to the mid LAD in 2001, patent at  angiography at [facility] back in 2014 as outlined above. Symptom onset has been within the last month. We discussed options for further cardiac diagnostic testing, and he is in agreement to proceed with a cardiac catheterization. We reviewed the risks and benefits. I asked her to start taking an aspirin daily, he has nitroglycerin available. Patient bradycardic at baseline so not starting beta blocker. He will need to have a lipid panel and we can discuss resuming statin therapy (not Lipitor).  2. History of hyperlipidemia with intolerance to Lipitor. He states that he was able to tolerate Crestor at one point.  3. History of anxiety, uses Xanax intermittently.  4. History of prostate cancer status post radiation treatments last year. Patient states that this has been controlled.  5. PTSD, patient still follows through the [facility] medical system in [place].  Current medicines were reviewed with the patient today.  Disposition: Follow-up with me after cardiac catheterization.  Signed, Satira Sark, MD, [facility] 12/09/2015 10:05 AM    [facility] [place], [place], [place] [address] Phone: [phone]; Fax: [phone]

## 2015-12-09 ENCOUNTER — Telehealth: Payer: Self-pay | Admitting: Cardiology

## 2015-12-09 ENCOUNTER — Other Ambulatory Visit: Payer: Self-pay | Admitting: Cardiology

## 2015-12-09 ENCOUNTER — Encounter: Payer: Self-pay | Admitting: *Deleted

## 2015-12-09 ENCOUNTER — Encounter: Payer: Self-pay | Admitting: Cardiology

## 2015-12-09 ENCOUNTER — Ambulatory Visit (INDEPENDENT_AMBULATORY_CARE_PROVIDER_SITE_OTHER): Payer: Medicare Other | Admitting: Cardiology

## 2015-12-09 VITALS — BP 138/78 | HR 59 | Ht 68.0 in | Wt 158.0 lb

## 2015-12-09 DIAGNOSIS — E782 Mixed hyperlipidemia: Secondary | ICD-10-CM

## 2015-12-09 DIAGNOSIS — Z789 Other specified health status: Secondary | ICD-10-CM

## 2015-12-09 DIAGNOSIS — I200 Unstable angina: Secondary | ICD-10-CM | POA: Diagnosis not present

## 2015-12-09 DIAGNOSIS — I25709 Atherosclerosis of coronary artery bypass graft(s), unspecified, with unspecified angina pectoris: Secondary | ICD-10-CM | POA: Diagnosis not present

## 2015-12-09 MED ORDER — ASPIRIN EC 81 MG PO TBEC: 81.0000 mg | DELAYED_RELEASE_TABLET | Freq: Every day | ORAL | Status: AC

## 2015-12-09 NOTE — Telephone Encounter (Signed)
Left heart cath - Friday, 10/13 - 10:30 - [place] CHECK PERCERT

## 2015-12-09 NOTE — Patient Instructions (Addendum)
Medication Instructions:   Begin Aspirin '81mg'$  daily.   Continue all other current medications.  Labwork: none  Testing/Procedures: Your physician has requested that you have a cardiac catheterization. Cardiac catheterization is used to diagnose and/or treat various heart conditions. Doctors may recommend this procedure for a number of different reasons. The most common reason is to evaluate chest pain. Chest pain can be a symptom of coronary artery disease (CAD), and cardiac catheterization can show whether plaque is narrowing or blocking your heart's arteries. This procedure is also used to evaluate the valves, as well as measure the blood flow and oxygen levels in different parts of your heart. For further information please visit [email or web address]. Please follow instruction sheet, as given.  Follow-Up: 2-3 weeks   Any Other Special Instructions Will Be Listed Below (If Applicable).  If you need a refill on your cardiac medications before your next appointment, please call your pharmacy.

## 2015-12-09 NOTE — Telephone Encounter (Signed)
No precert required.  Pt has [employer] MCR.

## 2015-12-12 ENCOUNTER — Encounter (HOSPITAL_COMMUNITY)
Admission: RE | Disposition: A | Discharge status: Home / Self Care | Payer: Self-pay | Source: Ambulatory Visit | Attending: Cardiology

## 2015-12-12 ENCOUNTER — Other Ambulatory Visit: Payer: Self-pay | Admitting: Cardiology

## 2015-12-12 ENCOUNTER — Ambulatory Visit (HOSPITAL_COMMUNITY)
Admission: RE | Admit: 2015-12-12 | Discharge: 2015-12-12 | Disposition: A | Discharge status: Home / Self Care | Payer: Medicare Other | Source: Ambulatory Visit | Attending: Cardiology | Admitting: Cardiology

## 2015-12-12 DIAGNOSIS — I2510 Atherosclerotic heart disease of native coronary artery without angina pectoris: Secondary | ICD-10-CM | POA: Diagnosis present

## 2015-12-12 DIAGNOSIS — Z923 Personal history of irradiation: Secondary | ICD-10-CM | POA: Insufficient documentation

## 2015-12-12 DIAGNOSIS — I25110 Atherosclerotic heart disease of native coronary artery with unstable angina pectoris: Secondary | ICD-10-CM | POA: Diagnosis not present

## 2015-12-12 DIAGNOSIS — Z955 Presence of coronary angioplasty implant and graft: Secondary | ICD-10-CM

## 2015-12-12 DIAGNOSIS — F411 Generalized anxiety disorder: Secondary | ICD-10-CM | POA: Diagnosis not present

## 2015-12-12 DIAGNOSIS — Z8546 Personal history of malignant neoplasm of prostate: Secondary | ICD-10-CM | POA: Insufficient documentation

## 2015-12-12 DIAGNOSIS — M1990 Unspecified osteoarthritis, unspecified site: Secondary | ICD-10-CM | POA: Diagnosis not present

## 2015-12-12 DIAGNOSIS — N400 Enlarged prostate without lower urinary tract symptoms: Secondary | ICD-10-CM | POA: Diagnosis not present

## 2015-12-12 DIAGNOSIS — E785 Hyperlipidemia, unspecified: Secondary | ICD-10-CM | POA: Diagnosis not present

## 2015-12-12 DIAGNOSIS — F329 Major depressive disorder, single episode, unspecified: Secondary | ICD-10-CM | POA: Diagnosis not present

## 2015-12-12 DIAGNOSIS — Z7982 Long term (current) use of aspirin: Secondary | ICD-10-CM | POA: Insufficient documentation

## 2015-12-12 DIAGNOSIS — Z87891 Personal history of nicotine dependence: Secondary | ICD-10-CM | POA: Insufficient documentation

## 2015-12-12 DIAGNOSIS — F4310 Post-traumatic stress disorder, unspecified: Secondary | ICD-10-CM | POA: Insufficient documentation

## 2015-12-12 DIAGNOSIS — R079 Chest pain, unspecified: Secondary | ICD-10-CM | POA: Diagnosis present

## 2015-12-12 DIAGNOSIS — I200 Unstable angina: Secondary | ICD-10-CM

## 2015-12-12 HISTORY — PX: CARDIAC CATHETERIZATION: SHX172

## 2015-12-12 LAB — BASIC METABOLIC PANEL
Anion gap: 10 (ref 5–15)
BUN: 9 mg/dL (ref 6–20)
CO2: 24 mmol/L (ref 22–32)
Calcium: 9.7 mg/dL (ref 8.9–10.3)
Chloride: 105 mmol/L (ref 101–111)
Creatinine, Ser: 0.74 mg/dL (ref 0.61–1.24)
GFR calc Af Amer: >60 mL/min (ref >60)
GFR calc non Af Amer: >60 mL/min (ref >60)
Glucose, Bld: 99 mg/dL (ref 65–99)
Potassium: 3.9 mmol/L (ref 3.5–5.1)
Sodium: 139 mmol/L (ref 135–145)

## 2015-12-12 LAB — CBC
HCT: 38.3 % — ABNORMAL LOW (ref 39.0–52.0)
Hemoglobin: 12.8 g/dL — ABNORMAL LOW (ref 13.0–17.0)
MCH: 30.2 pg (ref 26.0–34.0)
MCHC: 33.4 g/dL (ref 30.0–36.0)
MCV: 90.3 fL (ref 78.0–100.0)
Platelets: 228 10*3/uL (ref 150–400)
RBC: 4.24 MIL/uL (ref 4.22–5.81)
RDW: 12.6 % (ref 11.5–15.5)
WBC: 6.3 10*3/uL (ref 4.0–10.5)

## 2015-12-12 LAB — PROTIME-INR
INR: 1.01
Prothrombin Time: 13.3 seconds (ref 11.4–15.2)

## 2015-12-12 LAB — POCT ACTIVATED CLOTTING TIME
Activated Clotting Time: 192 seconds
Activated Clotting Time: 219 seconds
Activated Clotting Time: 268 seconds

## 2015-12-12 SURGERY — LEFT HEART CATH AND CORONARY ANGIOGRAPHY

## 2015-12-12 MED ORDER — ASPIRIN 81 MG PO CHEW
CHEWABLE_TABLET | ORAL | Status: AC
  Administered 2015-12-12: 81 mg via ORAL
  Filled 2015-12-12: qty 1

## 2015-12-12 MED ORDER — NITROGLYCERIN 0.4 MG SL SUBL: 0.4000 mg | SUBLINGUAL_TABLET | SUBLINGUAL | Status: DC | PRN

## 2015-12-12 MED ORDER — FENTANYL CITRATE (PF) 100 MCG/2ML IJ SOLN
INTRAMUSCULAR | Status: DC | PRN
  Administered 2015-12-12: 25 ug via INTRAVENOUS

## 2015-12-12 MED ORDER — LIDOCAINE HCL (PF) 1 % IJ SOLN
INTRAMUSCULAR | Status: DC | PRN
  Administered 2015-12-12: 2 mL

## 2015-12-12 MED ORDER — MIDAZOLAM HCL 2 MG/2ML IJ SOLN
INTRAMUSCULAR | Status: AC
  Filled 2015-12-12: qty 2

## 2015-12-12 MED ORDER — VERAPAMIL HCL 2.5 MG/ML IV SOLN
INTRAVENOUS | Status: AC
  Filled 2015-12-12: qty 2

## 2015-12-12 MED ORDER — ACETAMINOPHEN 325 MG PO TABS: 650.0000 mg | ORAL_TABLET | ORAL | Status: DC | PRN

## 2015-12-12 MED ORDER — HYDRALAZINE HCL 20 MG/ML IJ SOLN
INTRAMUSCULAR | Status: AC
  Filled 2015-12-12: qty 1

## 2015-12-12 MED ORDER — HEPARIN (PORCINE) IN NACL 2-0.9 UNIT/ML-% IJ SOLN
INTRAMUSCULAR | Status: DC | PRN
  Administered 2015-12-12: 500 mL

## 2015-12-12 MED ORDER — NITROGLYCERIN 0.4 MG SL SUBL: 0.4000 mg | SUBLINGUAL_TABLET | SUBLINGUAL | 12 refills | Status: DC | PRN

## 2015-12-12 MED ORDER — TAMSULOSIN HCL 0.4 MG PO CAPS: 0.4000 mg | ORAL_CAPSULE | Freq: Two times a day (BID) | ORAL | Status: DC

## 2015-12-12 MED ORDER — SODIUM CHLORIDE 0.9% FLUSH: 3.0000 mL | INTRAVENOUS | Status: DC | PRN

## 2015-12-12 MED ORDER — HEPARIN SODIUM (PORCINE) 1000 UNIT/ML IJ SOLN
INTRAMUSCULAR | Status: AC
  Filled 2015-12-12: qty 1

## 2015-12-12 MED ORDER — IOPAMIDOL (ISOVUE-370) INJECTION 76%
INTRAVENOUS | Status: AC
  Filled 2015-12-12: qty 100

## 2015-12-12 MED ORDER — LEVOTHYROXINE SODIUM 75 MCG PO TABS: 75.0000 ug | ORAL_TABLET | Freq: Every day | ORAL | Status: DC

## 2015-12-12 MED ORDER — CLOPIDOGREL BISULFATE 75 MG PO TABS: 75.0000 mg | ORAL_TABLET | Freq: Every day | ORAL | 0 refills | Status: DC

## 2015-12-12 MED ORDER — ASPIRIN EC 81 MG PO TBEC: 81.0000 mg | DELAYED_RELEASE_TABLET | Freq: Every day | ORAL | Status: DC

## 2015-12-12 MED ORDER — ROSUVASTATIN CALCIUM 20 MG PO TABS: 20.0000 mg | ORAL_TABLET | Freq: Every day | ORAL | 11 refills | Status: DC

## 2015-12-12 MED ORDER — ONDANSETRON HCL 4 MG/2ML IJ SOLN: 4.0000 mg | Freq: Four times a day (QID) | INTRAMUSCULAR | Status: DC | PRN

## 2015-12-12 MED ORDER — CLOPIDOGREL BISULFATE 300 MG PO TABS
ORAL_TABLET | ORAL | Status: AC
  Filled 2015-12-12: qty 1

## 2015-12-12 MED ORDER — FENTANYL CITRATE (PF) 100 MCG/2ML IJ SOLN
INTRAMUSCULAR | Status: AC
  Filled 2015-12-12: qty 2

## 2015-12-12 MED ORDER — SODIUM CHLORIDE 0.9 % IV SOLN: 250.0000 mL | INTRAVENOUS | Status: DC | PRN

## 2015-12-12 MED ORDER — HEPARIN SODIUM (PORCINE) 1000 UNIT/ML IJ SOLN
INTRAMUSCULAR | Status: DC | PRN
Start: 2015-12-12 — End: 2015-12-12
  Administered 2015-12-12: 3000 [IU] via INTRAVENOUS
  Administered 2015-12-12: 3500 [IU] via INTRAVENOUS
  Administered 2015-12-12 (×2): 3000 [IU] via INTRAVENOUS

## 2015-12-12 MED ORDER — IOPAMIDOL (ISOVUE-370) INJECTION 76%
INTRAVENOUS | Status: DC | PRN
  Administered 2015-12-12: 140 mL via INTRA_ARTERIAL

## 2015-12-12 MED ORDER — SODIUM CHLORIDE 0.9% FLUSH: 3.0000 mL | Freq: Two times a day (BID) | INTRAVENOUS | Status: DC

## 2015-12-12 MED ORDER — LIDOCAINE HCL (PF) 1 % IJ SOLN
INTRAMUSCULAR | Status: AC
  Filled 2015-12-12: qty 30

## 2015-12-12 MED ORDER — SODIUM CHLORIDE 0.9 % IV SOLN
INTRAVENOUS | Status: DC
  Administered 2015-12-12: 09:00:00 via INTRAVENOUS

## 2015-12-12 MED ORDER — FLUTICASONE PROPIONATE 50 MCG/ACT NA SUSP
1.0000 | Freq: Two times a day (BID) | NASAL | Status: DC | PRN
  Administered 2015-12-12: 1 via NASAL
  Filled 2015-12-12: qty 16

## 2015-12-12 MED ORDER — CLOPIDOGREL BISULFATE 300 MG PO TABS
ORAL_TABLET | ORAL | Status: DC | PRN
  Administered 2015-12-12: 600 mg via ORAL

## 2015-12-12 MED ORDER — MIDAZOLAM HCL 2 MG/2ML IJ SOLN
INTRAMUSCULAR | Status: DC | PRN
Start: 2015-12-12 — End: 2015-12-12
  Administered 2015-12-12: 1 mg via INTRAVENOUS

## 2015-12-12 MED ORDER — CLOPIDOGREL BISULFATE 75 MG PO TABS: 75.0000 mg | ORAL_TABLET | Freq: Every day | ORAL | Status: DC

## 2015-12-12 MED ORDER — HEPARIN (PORCINE) IN NACL 2-0.9 UNIT/ML-% IJ SOLN
INTRAMUSCULAR | Status: AC
  Filled 2015-12-12: qty 500

## 2015-12-12 MED ORDER — SODIUM CHLORIDE 0.9 % WEIGHT BASED INFUSION: 3.0000 mL/kg/h | INTRAVENOUS | Status: AC

## 2015-12-12 MED ORDER — HYDRALAZINE HCL 20 MG/ML IJ SOLN
INTRAMUSCULAR | Status: DC | PRN
  Administered 2015-12-12: 10 mg via INTRAVENOUS

## 2015-12-12 MED ORDER — ASPIRIN 81 MG PO CHEW
81.0000 mg | CHEWABLE_TABLET | ORAL | Status: AC
  Administered 2015-12-12: 81 mg via ORAL

## 2015-12-12 MED ORDER — VERAPAMIL HCL 2.5 MG/ML IV SOLN
INTRAVENOUS | Status: DC | PRN
  Administered 2015-12-12: 10 mL via INTRA_ARTERIAL

## 2015-12-12 MED FILL — CLOPIDOGREL 75 MG TABLET: 75 | 30 days supply | Qty: 30 | Fill #0

## 2015-12-12 SURGICAL SUPPLY — 20 items
BALLN EMERGE MR 2.5X12 (BALLOONS) ×2
BALLN ~~LOC~~ EUPHORA RX 3.25X8 (BALLOONS) ×2
BALLOON EMERGE MR 2.5X12 (BALLOONS) IMPLANT
BALLOON ~~LOC~~ EUPHORA RX 3.25X8 (BALLOONS) IMPLANT
CATH 5FR JL3.5 JR4 ANG PIG MP (CATHETERS) ×1 IMPLANT
CATH VISTA GUIDE 6FR XBLAD3.5 (CATHETERS) ×1 IMPLANT
DEVICE RAD COMP TR BAND LRG (VASCULAR PRODUCTS) ×1 IMPLANT
GLIDESHEATH INTRODUCER 6F 10CM (SHEATH) ×1 IMPLANT
KIT ENCORE 26 ADVANTAGE (KITS) ×1 IMPLANT
KIT HEART LEFT (KITS) ×2 IMPLANT
NDL PERC 21GX4CM (NEEDLE) IMPLANT
NEEDLE PERC 21GX4CM (NEEDLE) ×2 IMPLANT
PACK CARDIAC CATHETERIZATION (CUSTOM PROCEDURE TRAY) ×2 IMPLANT
STENT PROMUS PREM MR 3.0X12 (Permanent Stent) ×1 IMPLANT
SYR MEDRAD MARK V 150ML (SYRINGE) ×2 IMPLANT
TRANSDUCER W/STOPCOCK (MISCELLANEOUS) ×2 IMPLANT
TUBING CIL FLEX 10 FLL-RA (TUBING) ×2 IMPLANT
WIRE ASAHI PROWATER 180CM (WIRE) ×1 IMPLANT
WIRE EMERALD 3MM-J .035X260CM (WIRE) ×1 IMPLANT
WIRE HI TORQ VERSACORE-J 145CM (WIRE) ×1 IMPLANT

## 2015-12-12 NOTE — Progress Notes (Signed)
[id number] Education completed with pt and wife who voiced understanding. Stressed importance of plavix with stent. Reviewed NTG use, risk factors, ex ed, and heart healthy diet. Discussed CRP 2 and will refer to [facility] program. Graylon Good RN BSN 12/12/2015 2:21 PM

## 2015-12-12 NOTE — Discharge Instructions (Signed)
Radial Site Care °Refer to this sheet in the next few weeks. These instructions provide you with information about caring for yourself after your procedure. Your health care provider may also give you more specific instructions. Your treatment has been planned according to current medical practices, but problems sometimes occur. Call your health care provider if you have any problems or questions after your procedure. °WHAT TO EXPECT AFTER THE PROCEDURE °After your procedure, it is typical to have the following: °· Bruising at the radial site that usually fades within 1-2 weeks. °· Blood collecting in the tissue (hematoma) that may be painful to the touch. It should usually decrease in size and tenderness within 1-2 weeks. °HOME CARE INSTRUCTIONS °· Take medicines only as directed by your health care provider. °· You may shower 24-48 hours after the procedure or as directed by your health care provider. Remove the bandage (dressing) and gently wash the site with plain soap and water. Pat the area dry with a clean towel. Do not rub the site, because this may cause bleeding. °· Do not take baths, swim, or use a hot tub until your health care provider approves. °· Check your insertion site every day for redness, swelling, or drainage. °· Do not apply powder or lotion to the site. °· Do not flex or bend the affected arm for 24 hours or as directed by your health care provider. °· Do not push or pull heavy objects with the affected arm for 24 hours or as directed by your health care provider. °· Do not lift over 10 lb (4.5 kg) for 5 days after your procedure or as directed by your health care provider. °· Ask your health care provider when it is okay to: °¨ Return to work or school. °¨ Resume usual physical activities or sports. °¨ Resume sexual activity. °· Do not drive home if you are discharged the same day as the procedure. Have someone else drive you. °· You may drive 24 hours after the procedure unless otherwise  instructed by your health care provider. °· Do not operate machinery or power tools for 24 hours after the procedure. °· If your procedure was done as an outpatient procedure, which means that you went home the same day as your procedure, a responsible adult should be with you for the first 24 hours after you arrive home. °· Keep all follow-up visits as directed by your health care provider. This is important. °SEEK MEDICAL CARE IF: °· You have a fever. °· You have chills. °· You have increased bleeding from the radial site. Hold pressure on the site. CALL 911 °SEEK IMMEDIATE MEDICAL CARE IF: °· You have unusual pain at the radial site. °· You have redness, warmth, or swelling at the radial site. °· You have drainage (other than a small amount of blood on the dressing) from the radial site. °· The radial site is bleeding, and the bleeding does not stop after 30 minutes of holding steady pressure on the site. °· Your arm or hand becomes pale, cool, tingly, or numb. °  °This information is not intended to replace advice given to you by your health care provider. Make sure you discuss any questions you have with your health care provider. °  °Document Released: 03/20/2010 Document Revised: 03/08/2014 Document Reviewed: 09/03/2013 °Elsevier Interactive Patient Education ©2016 [employer] Inc[employer] ° °

## 2015-12-12 NOTE — H&P (View-Only) (Signed)
.    Cardiology Office Note  Date: 12/09/2015   ID: Keith Toniann Fail., DOB 1940/10/25, MRN [id number]  PCP: Worthy Rancher, MD  Referring Provider: Ouida Sills, NP  Consulting Cardiologist: Rozann Lesches, MD   Chief Complaint  Patient presents with  . Coronary Artery Disease  . Chest discomfort  . Shortness of Breath    History of Present Illness: Keith Santanna Olenik. is a 75 y.o. male referred for cardiology consultation by Ms. Hawks NP. I reviewed records and updated the chart. He is here today with his wife. He states that about one month ago he developed an episode of severe left chest pain with discomfort in the left arm. He had nitroglycerin available but did not take it, decided to take a nap instead. Eventually the symptoms resolved after a few hours, but he has had recurring chest tightness since that time, about every 2 or 3 days, often with activity. Also feels more short of breath just walking in his yard or across a parking lot. These symptoms are new within the last month. They remind him of prior angina from years ago.  Cardiac history includes placement of BMS to the mid LAD in 2001 with our practice, more recently evaluation by the [facility] cardiology practice, seen in 2014 with follow-up cardiac catheterization revealing patent stent site and otherwise mild nonobstructive disease.  He is on limited medical therapy, essentially has nitroglycerin available, takes aspirin intermittently. He reports prior significant intolerance to Lipitor due to muscle aching and weakness. He states he was able to take Crestor, but has not been on any statin recently. No recent lipid numbers available.  He also reports having trouble with significant anxiety, he uses Xanax intermittently. He and his wife are raising 2 grandchildren at home.  Past Medical History:  Diagnosis Date  . Agent orange exposure   . Arthritis   . Coronary artery disease    BMS to mid LAD 2001  . Enlarged  prostate    XRT 2016  . Headache    Sinus headaches   . History of depression   . Hyperlipidemia   . Prostate cancer ([place])   . PTSD (post-traumatic stress disorder)     Past Surgical History:  Procedure Laterality Date  . HERNIA REPAIR Right   . PROSTATE BIOPSY N/A 08/28/2014   Procedure: BIOPSY TRANSRECTAL ULTRASONIC PROSTATE (TUBP);  Surgeon: Rana Snare, MD;  Location: [facility];  Service: Urology;  Laterality: N/A;  . TRANSURETHRAL RESECTION OF PROSTATE N/A 08/28/2014   Procedure: TRANSURETHRAL RESECTION OF THE PROSTATE WITH GYRUS INSTRUMENTS;  Surgeon: Rana Snare, MD;  Location: [facility];  Service: Urology;  Laterality: N/A;    Current Outpatient Prescriptions  Medication Sig Dispense Refill  . ALPRAZolam (XANAX) 0.25 MG tablet Take 1 tablet (0.25 mg total) by mouth at bedtime. 30 tablet 0  . fluticasone (FLONASE) 50 MCG/ACT nasal spray Place 1 spray into both nostrils 2 (two) times daily as needed for allergies or rhinitis. 16 g 6  . levothyroxine (SYNTHROID, LEVOTHROID) 75 MCG tablet Take 75 mcg by mouth daily before breakfast.    . nitroGLYCERIN (NITROSTAT) 0.4 MG SL tablet Place 0.4 mg under the tongue every 5 (five) minutes as needed for chest pain.    . tamsulosin (FLOMAX) 0.4 MG CAPS capsule Take 0.4 mg by mouth 2 (two) times daily.    Marland Kitchen aspirin EC 81 MG tablet Take 1 tablet (81 mg total) by mouth daily.     No current facility-administered  medications for this visit.    Allergies:  Lipitor [atorvastatin]   Social History: The patient  reports that he has never smoked. He has quit using smokeless tobacco. His smokeless tobacco use included Chew. He reports that he does not drink alcohol or use drugs.   Family History: The patient's family history includes Arthritis in his mother.   ROS:  Please see the history of present illness. Otherwise, complete review of systems is positive for anxiety.  All other systems are reviewed and negative.   Physical Exam: VS:  BP 138/78    Pulse (!) 59   Ht '5\' 8"'$  (1.727 m)   Wt 158 lb (71.7 kg)   SpO2 98%   BMI 24.02 kg/m , BMI Body mass index is 24.02 kg/m.  Wt Readings from Last 3 Encounters:  12/09/15 158 lb (71.7 kg)  10/10/15 155 lb (70.3 kg)  08/29/15 155 lb 9.6 oz (70.6 kg)    General: Patient appears comfortable at rest. HEENT: Conjunctiva and lids normal, oropharynx clear. Neck: Supple, no elevated JVP or carotid bruits, no thyromegaly. Lungs: Clear to auscultation, nonlabored breathing at rest. Cardiac: Regular rate and rhythm, no S3 or significant systolic murmur, no pericardial rub. Abdomen: Soft, nontender, bowel sounds present, no guarding or rebound. Extremities: No pitting edema, distal pulses 2+. Skin: Warm and dry. Musculoskeletal: No kyphosis. Neuropsychiatric: Alert and oriented x3, affect grossly appropriate.  ECG: I personally reviewed the tracing from 10/10/2015 which showed sinus bradycardia.  Recent Labwork:  June 2016: Potassium 4.4, BUN 6, creatinine 0.7, hemoglobin 13.9, platelets 193   Other Studies Reviewed Today:  Cardiac catheterization 03/02/2012 ([facility]): SELECTIVE ANGIOGRAPHY RESULTS: The right coronary artery has scattered 20 to 30% lesions noted in the proximal and mid-portion and distal. It is a large dominant vessel. The left coronary artery has a typical anatomy. The LAD is a large vessel that wraps the apex. It does have a stent in the mid-portion that has 30% in-stent stenosis noted. There is good flow throughout the vessel. The circumflex artery has a small OM1 and a small distal OM2. It is relatively disease free. HEMODYNAMICS: His LVEDP was 12 mmHg. A pullback showed no significant gradient. An LV gram showed an EF of 60 to 65% with no regional wall motion abnormalities.  Assessment and Plan:  1. Dyspnea on exertion and recurring chest discomfort concerning for accelerating angina. Patient has known CAD status post BMS to the mid LAD in 2001, patent at  angiography at [facility] back in 2014 as outlined above. Symptom onset has been within the last month. We discussed options for further cardiac diagnostic testing, and he is in agreement to proceed with a cardiac catheterization. We reviewed the risks and benefits. I asked her to start taking an aspirin daily, he has nitroglycerin available. Patient bradycardic at baseline so not starting beta blocker. He will need to have a lipid panel and we can discuss resuming statin therapy (not Lipitor).  2. History of hyperlipidemia with intolerance to Lipitor. He states that he was able to tolerate Crestor at one point.  3. History of anxiety, uses Xanax intermittently.  4. History of prostate cancer status post radiation treatments last year. Patient states that this has been controlled.  5. PTSD, patient still follows through the [facility] medical system in [place].  Current medicines were reviewed with the patient today.  Disposition: Follow-up with me after cardiac catheterization.  Signed, Satira Sark, MD, [facility] 12/09/2015 10:05 AM    [facility] [place], [place], [place] [address] Phone: [phone]; Fax: [phone]

## 2015-12-12 NOTE — Interval H&P Note (Signed)
History and Physical Interval Note:  12/12/2015 10:58 AM  Peterson Toniann Fail.  has presented today for surgery, with the diagnosis of angina - cad  The various methods of treatment have been discussed with the patient and family. After consideration of risks, benefits and other options for treatment, the patient has consented to  Procedure(s): Left Heart Cath and Coronary Angiography (N/A) as a surgical intervention .  The patient's history has been reviewed, patient examined, no change in status, stable for surgery.  I have reviewed the patient's chart and labs.  Questions were answered to the patient's satisfaction.   Cath Lab Visit (complete for each Cath Lab visit)  Clinical Evaluation Leading to the Procedure:   ACS: Yes.    Non-ACS:    Anginal Classification: CCS III  Anti-ischemic medical therapy: Minimal Therapy (1 class of medications)  Non-Invasive Test Results: No non-invasive testing performed  Prior CABG: No previous CABG        Collier Salina [facility] 12/12/2015 10:58 AM

## 2015-12-12 NOTE — Discharge Summary (Signed)
Discharge Summary    Patient ID: Keith Olson.,  MRN: [id number], DOB/AGE: [birth year] 75 y.o.  Admit date: 12/12/2015 Discharge date: 12/12/2015  Primary Care Provider: Fransisca Kaufmann Dettinger Primary Cardiologist: Dr Domenic Polite, [facility]  Discharge Diagnoses    Principal Problem:   Accelerating angina [facility]) Active Problems:   Hyperlipidemia   CAD in native artery   Allergies Allergies  Allergen Reactions  . Lipitor [Atorvastatin]     Muscles aches    Diagnostic Studies/Procedures    CATH 12/12/2015  Prox LAD lesion, 80 %stenosed.  RPDA lesion, 50 %stenosed.  The left ventricular systolic function is normal.  LV end diastolic pressure is normal.  The left ventricular ejection fraction is 55-65% by visual estimate.  A STENT PROMUS PREM MR 3.0X12 drug eluting stent was successfully placed, and overlaps previously placed stent.  Prox LAD to Mid LAD lesion, 85 %stenosed.  Post intervention, there is a 0% residual stenosis.  1. Single vessel obstructive CAD. There is a stenosis at the proximal margin of the old stent.  2. Normal LV function and normal LV EDP 3. Successful stenting of the proximal LAD with DES- overlapping his old stent Plan: DAPT for one year with Plavix and ASA. Will add Crestor 20 mg daily. (intolerant of lipitor). Patient is a candidate for same day discharge.  _____________   History of Present Illness     BMS to the mid LAD in 2001, cath 2014 ok, BPH, HLD, prostate CA, PTSD, depression. Seen by Dr Domenic Polite as a new patient 10/10 and symptoms concerning for accelerating angina. Cath scheduled and pt came in for the procedure 12/12/2015.  Hospital Course     Consultants: None   Cardiac cath was performed, results above. He had a stent to the proximal LAD, tolerated the procedure well.   Post-cath, he was ambulating without chest pain or SOB. He is considered stable for discharge, to follow up as an outpatient.   _____________  Discharge  Vitals Blood pressure (!) 118/57, pulse 63, temperature 97.7 F (36.5 C), temperature source Oral, resp. rate 16, height '5\' 8"'$  (1.727 m), weight 152 lb (68.9 kg), SpO2 96 %.  Filed Weights   12/12/15 0817  Weight: 152 lb (68.9 kg)    Labs & Radiologic Studies    CBC  Recent Labs  12/12/15 0845  WBC 6.3  HGB 12.8*  HCT 38.3*  MCV 90.3  PLT 329   Basic Metabolic Panel  Recent Labs  12/12/15 0845  NA 139  K 3.9  CL 105  CO2 24  GLUCOSE 99  BUN 9  CREATININE 0.74  CALCIUM 9.7   _____________   Disposition   Pt is being discharged home today in good condition.  Follow-up Plans & Appointments    Follow-up Information    Rozann Lesches, MD .   Specialty:  Cardiology Why:  The office will call. Contact information: [place] [id number] [phone]          Discharge Instructions    AMB Referral to Cardiac Rehabilitation - Phase II    Complete by:  As directed    Diagnosis:  Coronary Stents   Amb Referral to Cardiac Rehabilitation    Complete by:  As directed    Diagnosis:  Coronary Stents      Discharge Medications   Current Discharge Medication List    START taking these medications   Details  rosuvastatin (CRESTOR) 20 MG tablet Take 1 tablet (20  mg total) by mouth daily. Qty: 30 tablet, Refills: 11      CONTINUE these medications which have CHANGED   Details  nitroGLYCERIN (NITROSTAT) 0.4 MG SL tablet Place 1 tablet (0.4 mg total) under the tongue every 5 (five) minutes as needed for chest pain. Qty: 25 tablet, Refills: 12      CONTINUE these medications which have NOT CHANGED   Details  aspirin EC 81 MG tablet Take 1 tablet (81 mg total) by mouth daily.    fluticasone (FLONASE) 50 MCG/ACT nasal spray Place 1 spray into both nostrils 2 (two) times daily as needed for allergies or rhinitis. Qty: 16 g, Refills: 6   Associated Diagnoses: Acute rhinosinusitis    levothyroxine (SYNTHROID, LEVOTHROID) 75 MCG tablet  Take 75 mcg by mouth daily before breakfast. hasnt been taking because needs to be refilled and then will start back .    tamsulosin (FLOMAX) 0.4 MG CAPS capsule Take 0.4 mg by mouth 2 (two) times daily.    ALPRAZolam (XANAX) 0.25 MG tablet Take 1 tablet (0.25 mg total) by mouth at bedtime. Qty: 30 tablet, Refills: 0   Associated Diagnoses: Anxiety state    clopidogrel (PLAVIX) 75 MG tablet Take 1 tablet (75 mg total) by mouth daily. Qty: 30 tablet, Refills: 0          Outstanding Labs/Studies   None  Duration of Discharge Encounter   Greater than 30 minutes including physician time.  Jonetta Speak NP 12/12/2015, 6:21 PM

## 2015-12-15 ENCOUNTER — Telehealth: Payer: Self-pay | Admitting: Family Medicine

## 2015-12-15 ENCOUNTER — Telehealth: Payer: Self-pay | Admitting: Cardiology

## 2015-12-15 ENCOUNTER — Encounter (HOSPITAL_COMMUNITY): Payer: Self-pay | Admitting: Cardiology

## 2015-12-15 DIAGNOSIS — J019 Acute sinusitis, unspecified: Secondary | ICD-10-CM

## 2015-12-15 NOTE — Progress Notes (Addendum)
Same Day PCI D/C Phone Call   '[]'$   1st call         '[x]'$   Answer.         '[]'$   No Answer    '[]'$   2nd call        '[]'$   Answer       '[]'$   No Answer   Is there bleeding or other abnormality from access site?  '[]'$   Yes    '[x]'$   No  Has there been any chest pain, nausea or vomiting?    '[x]'$   No  '[]'$   Yes chest pain   '[]'$   Yes nausea or vomiting  Have you sought any medical attention since discharge?    '[]'$   Yes  '[x]'$   No  Remind Patient about their follow up appointment, review discharge & medication instructions.   '[x]'$   Yes  '[]'$   No  Are you continuing to take you daily ASA & antiplatlet inhibitor(Plavix, Effient, Brilinitia)?  '[x]'$   Yes  '[x]'$   No  Would you say you were satisfied with you Same Day Discharge?  (Disagree, Neutral, Agree)   '[]'$   Disagree, why  '[]'$   Neutral  '[x]'$   Agree

## 2015-12-15 NOTE — Telephone Encounter (Signed)
Keith Olson was discharged from [facility] 12-12-15. Per Rosaria Ferries, PA he is considered a PCI and must be seen within 7 days of discharge.

## 2015-12-16 ENCOUNTER — Encounter: Payer: Self-pay | Admitting: Family Medicine

## 2015-12-16 ENCOUNTER — Ambulatory Visit (INDEPENDENT_AMBULATORY_CARE_PROVIDER_SITE_OTHER): Payer: Medicare Other | Admitting: Family Medicine

## 2015-12-16 VITALS — BP 145/78 | HR 69 | Temp 96.9°F | Ht 68.0 in | Wt 157.0 lb

## 2015-12-16 DIAGNOSIS — F411 Generalized anxiety disorder: Secondary | ICD-10-CM

## 2015-12-16 DIAGNOSIS — C61 Malignant neoplasm of prostate: Secondary | ICD-10-CM

## 2015-12-16 DIAGNOSIS — I2510 Atherosclerotic heart disease of native coronary artery without angina pectoris: Secondary | ICD-10-CM | POA: Diagnosis not present

## 2015-12-16 MED ORDER — ALPRAZOLAM 0.5 MG PO TABS
0.5000 mg | ORAL_TABLET | Freq: Two times a day (BID) | ORAL | 0 refills | Status: DC | PRN
Start: 2015-12-16 — End: 2016-01-27

## 2015-12-16 NOTE — Progress Notes (Signed)
Subjective:    Patient ID: Keith Toniann Fail., male    DOB: August 10, 1940, 75 y.o.   MRN: [id number]  HPI Patient here today for anxiety and depression. He is accompanied today by his wife. He has prostate cancer and he has had some recent heart issues.  He had a stent in the past and coronary arteries but had another one last week. Regarding prostate cancer, he has had radiation treatment and now is receiving Lupron shots every 3 months. Last PSA was detectable. He appears very anxious. He and his wife are raising grandchildren. Children as well as his health issues are producing lots of anxiety.    Patient Active Problem List   Diagnosis Date Noted  . Accelerating angina ([place]) 12/12/2015  . CAD in native artery 12/12/2015  . Constipation 04/22/2015  . Anxiety state 12/16/2014  . Malignant neoplasm of prostate ([place]) 09/19/2014  . 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 Hyperlipidemia 05/22/2012  . BPH (benign prostatic hyperplasia) 05/22/2012   Outpatient Encounter Prescriptions as of 12/16/2015  Medication Sig  . aspirin EC 81 MG tablet Take 1 tablet (81 mg total) by mouth daily.  . clopidogrel (PLAVIX) 75 MG tablet Take 1 tablet (75 mg total) by mouth daily.  . fluticasone (FLONASE) 50 MCG/ACT nasal spray Place 1 spray into both nostrils 2 (two) times daily as needed for allergies or rhinitis.  Marland Kitchen levothyroxine (SYNTHROID, LEVOTHROID) 75 MCG tablet Take 75 mcg by mouth daily before breakfast. hasnt been taking because needs to be refilled and then will start back .  . rosuvastatin (CRESTOR) 20 MG tablet Take 1 tablet (20 mg total) by mouth daily.  . tamsulosin (FLOMAX) 0.4 MG CAPS capsule Take 0.4 mg by mouth 2 (two) times daily.  . nitroGLYCERIN (NITROSTAT) 0.4 MG SL tablet Place 1 tablet (0.4 mg total) under the tongue every 5 (five) minutes as needed for chest pain. (Patient not taking: Reported on 12/16/2015)  . [DISCONTINUED] ALPRAZolam (XANAX) 0.25 MG tablet Take 1 tablet (0.25  mg total) by mouth at bedtime. (Patient not taking: Reported on 12/16/2015)   No facility-administered encounter medications on file as of 12/16/2015.       Review of Systems  Constitutional: Negative.   HENT: Negative.   Eyes: Negative.   Respiratory: Negative.   Cardiovascular: Negative.   Gastrointestinal: Negative.   Endocrine: Negative.   Genitourinary: Negative.   Musculoskeletal: Negative.   Skin: Negative.   Allergic/Immunologic: Negative.   Neurological: Negative.   Hematological: Negative.   Psychiatric/Behavioral: The patient is nervous/anxious (some depression).        Objective:   Physical Exam  Constitutional: He is oriented to person, place, and time. He appears well-developed and well-nourished.  Cardiovascular: Normal rate, regular rhythm and normal heart sounds.   Neurological: He is alert and oriented to person, place, and time.  Psychiatric: He has a normal mood and affect. His behavior is normal.    BP (!) 145/78 (BP Location: Left Arm)   Pulse 69   Temp (!) 96.9 F (36.1 C) (Oral)   Ht '5\' 8"'$  (1.727 m)   Wt 157 lb (71.2 kg)   BMI 23.87 kg/m        Assessment & Plan:  1. CAD in native artery Recent stent. No complications. Now taking Plavix. Had a drug-eluting stent  2. Malignant neoplasm of prostate (HCC) PSA is not detectable. This is being followed by urologist in [place]  3. Anxiety state Source of anxiety as his medical problems.  Also has a history of PTSD. Did not tolerate SSRI that he was given by the [facility] will put him back on alprazolam 0.5 mg twice a day and recheck in one month  Wardell Honour MD

## 2015-12-17 MED ORDER — FLUTICASONE PROPIONATE 50 MCG/ACT NA SUSP: 1.0000 | Freq: Two times a day (BID) | NASAL | 6 refills | Status: DC | PRN

## 2015-12-17 NOTE — Telephone Encounter (Signed)
Have not received these requests. They are not on med list?

## 2015-12-17 NOTE — Telephone Encounter (Signed)
TC to pt that 2 of the requested medications are not on his medication list, that we are not able to refill since we did not prescribe them

## 2015-12-17 NOTE — Telephone Encounter (Signed)
Okay refill on Flonase but I do not see the other medicines omega-3 and lidocaine on medicine list

## 2015-12-19 ENCOUNTER — Encounter: Payer: Self-pay | Admitting: Physician Assistant

## 2015-12-19 ENCOUNTER — Ambulatory Visit (INDEPENDENT_AMBULATORY_CARE_PROVIDER_SITE_OTHER): Payer: Medicare Other | Admitting: Physician Assistant

## 2015-12-19 VITALS — BP 142/78 | HR 54 | Ht 68.0 in | Wt 158.2 lb

## 2015-12-19 DIAGNOSIS — Z955 Presence of coronary angioplasty implant and graft: Secondary | ICD-10-CM | POA: Diagnosis not present

## 2015-12-19 DIAGNOSIS — E785 Hyperlipidemia, unspecified: Secondary | ICD-10-CM | POA: Diagnosis not present

## 2015-12-19 DIAGNOSIS — R001 Bradycardia, unspecified: Secondary | ICD-10-CM | POA: Diagnosis not present

## 2015-12-19 DIAGNOSIS — I2510 Atherosclerotic heart disease of native coronary artery without angina pectoris: Secondary | ICD-10-CM | POA: Diagnosis not present

## 2015-12-19 NOTE — Patient Instructions (Signed)
Rosaria Ferries, PA-C, recommends that you schedule a follow-up appointment in 6 months with Dr Domenic Polite in [place]. You will receive a reminder letter in the mail two months in advance. If you don't receive a letter, please call our office to schedule the follow-up appointment.   Your physician has requested that you regularly monitor and record your blood pressure readings at home. Please use the same machine at the same time of day to check your readings and record them to bring to your follow-up visit. Please send Suanne Marker a message via mychart in 2 weeks with these blood pressure readings.

## 2015-12-19 NOTE — Progress Notes (Signed)
Cardiology Office Note   Date:  12/19/2015   ID:  Keith Ward Boissonneault., DOB February 06, 1941, MRN [id number]  PCP:  Worthy Rancher, MD  Cardiologist:  Dr Domenic Polite, [place], PA-C   No chief complaint on file.   History of Present Illness: Keith Dorion Petillo. is a 75 y.o. male with a history of BMS to the mid LAD in 2001, cath 2014 ok, BPH, HLD, prostate CA, PTSD, depression  D/c 10/13 after admit for angina>>cath>>DES pLAD, tol well.  Keith Toniann Fail. presents for post hospital follow-up.  Since discharge from the hospital, he has done extremely well. He states that from the day he got home, he felt so much better. He is increasing his activity and doing well with this. He does not feel he is doing too much, but is doing walking like he was told and is having no problems at all with it. He has not had any problems from the procedure except for some mild bruising at his cath site. He has not had lower extremity edema, orthopnea, or PND. He is compliant with his medications. He is interested in cardiac rehabilitation. He has not had any side effects from the medications.  He has no awareness that his heart rate is slow and never gets lightheaded or dizzy.   Past Medical History:  Diagnosis Date  . Agent orange exposure   . Arthritis   . Coronary artery disease    BMS to mid LAD 2001  . Enlarged prostate    XRT 2016  . Headache    Sinus headaches   . History of depression   . Hyperlipidemia   . Prostate cancer ([place])   . PTSD (post-traumatic stress disorder)     Past Surgical History:  Procedure Laterality Date  . CARDIAC CATHETERIZATION N/A 12/12/2015   Procedure: Left Heart Cath and Coronary Angiography;  Surgeon: Peter M Martinique, MD;  Location: [facility];  Service: Cardiovascular;  Laterality: N/A;  . CARDIAC CATHETERIZATION N/A 12/12/2015   Procedure: Coronary Stent Intervention;  Surgeon: Peter M Martinique, MD;  Location: [facility];  Service:  Cardiovascular;  Laterality: N/A;  . HERNIA REPAIR Right   . PROSTATE BIOPSY N/A 08/28/2014   Procedure: BIOPSY TRANSRECTAL ULTRASONIC PROSTATE (TUBP);  Surgeon: Rana Snare, MD;  Location: [facility];  Service: Urology;  Laterality: N/A;  . TRANSURETHRAL RESECTION OF PROSTATE N/A 08/28/2014   Procedure: TRANSURETHRAL RESECTION OF THE PROSTATE WITH GYRUS INSTRUMENTS;  Surgeon: Rana Snare, MD;  Location: [facility];  Service: Urology;  Laterality: N/A;    Current Outpatient Prescriptions  Medication Sig Dispense Refill  . ALPRAZolam (XANAX) 0.5 MG tablet Take 1 tablet (0.5 mg total) by mouth 2 (two) times daily as needed for anxiety. 60 tablet 0  . aspirin EC 81 MG tablet Take 1 tablet (81 mg total) by mouth daily.    . clopidogrel (PLAVIX) 75 MG tablet Take 1 tablet (75 mg total) by mouth daily. 30 tablet 0  . fluticasone (FLONASE) 50 MCG/ACT nasal spray Place 1 spray into both nostrils 2 (two) times daily as needed for allergies or rhinitis. 16 g 6  . levothyroxine (SYNTHROID, LEVOTHROID) 75 MCG tablet Take 75 mcg by mouth daily before breakfast. hasnt been taking because needs to be refilled and then will start back .    . nitroGLYCERIN (NITROSTAT) 0.4 MG SL tablet Place 1 tablet (0.4 mg total) under the tongue every 5 (five) minutes as needed for chest pain. (Patient  not taking: Reported on 12/16/2015) 25 tablet 12  . rosuvastatin (CRESTOR) 20 MG tablet Take 1 tablet (20 mg total) by mouth daily. 30 tablet 11  . tamsulosin (FLOMAX) 0.4 MG CAPS capsule Take 0.4 mg by mouth 2 (two) times daily.     No current facility-administered medications for this visit.     Allergies:   Lipitor [atorvastatin]    Social History:  The patient  reports that he has never smoked. He has quit using smokeless tobacco. His smokeless tobacco use included Chew. He reports that he does not drink alcohol or use drugs.   Family History:  The patient's family history includes Arthritis in his mother.    ROS:  Please  see the history of present illness. All other systems are reviewed and negative.    PHYSICAL EXAM: VS:  There were no vitals taken for this visit. , BMI There is no height or weight on file to calculate BMI. GEN: Well nourished, well developed, male in no acute distress  HEENT: normal for age  Neck: no JVD, no carotid bruit, no masses Cardiac: RRR; Soft murmur, no rubs, or gallops Respiratory:  clear to auscultation bilaterally, normal work of breathing GI: soft, nontender, nondistended, + BS MS: no deformity or atrophy; no edema; distal pulses are 2+ in all 4 extremities; ecchymosis at the right radial cath site and a small knot of scar tissue is noted. He has good distal pulses. Skin: warm and dry, no rash Neuro:  Strength and sensation are intact Psych: euthymic mood, full affect   EKG:  EKG is ordered today. The ekg ordered today demonstrates sinus bradycardia, heart rate 54, no acute ischemic changes and no Q waves  CATH 12/12/2015  Prox LAD lesion, 80 %stenosed.  RPDA lesion, 50 %stenosed.  The left ventricular systolic function is normal.  LV end diastolic pressure is normal.  The left ventricular ejection fraction is 55-65% by visual estimate.  A STENT PROMUS PREM MR 3.0X12 drug eluting stent was successfully placed, and overlaps previously placed stent.  Prox LAD to Mid LAD lesion, 85 %stenosed.  Post intervention, there is a 0% residual stenosis. 1. Single vessel obstructive CAD. There is a stenosis at the proximal margin of the old stent.  2. Normal LV function and normal LV EDP 3. Successful stenting of the proximal LAD with DES- overlapping his old stent Plan: DAPT for one year with Plavix and ASA. Will add Crestor 20 mg daily. (intolerant of lipitor). Patient is a candidate for same day discharge.   Recent Labs: 12/12/2015: BUN 9; Creatinine, Ser 0.74; Hemoglobin 12.8; Platelets 228; Potassium 3.9; Sodium 139    Lipid Panel No results found for: CHOL,  TRIG, HDL, CHOLHDL, VLDL, LDLCALC, LDLDIRECT   Wt Readings from Last 3 Encounters:  12/16/15 157 lb (71.2 kg)  12/12/15 152 lb (68.9 kg)  12/09/15 158 lb (71.7 kg)     Other studies Reviewed: Additional studies/ records that were reviewed today include: Office notes, hospital records and testing.  ASSESSMENT AND PLAN:  1.  CAD: His ischemic symptoms resolved once he received his stent. He is not having any problems post procedure. He had some mild ecchymosis at the cath site, but this is improving. Continue current therapy with aspirin, statin, Plavix, when necessary nitroglycerin. We will make sure he is referred to cardiac rehabilitation.  2. Bradycardia: He is not on a beta blocker because of this. He is asymptomatic from this. We can continue to follow him, and he was  advised to call us for any symptoms.  Hyperlipidemia: He had not tolerated Lipitor in the past, but is doing well with Crestor. Continue this and check lipid and liver when he follows up with Dr. Domenic Polite.   Current medicines are reviewed at length with the patient today.  The patient does not have concerns regarding medicines.  The following changes have been made:  no change  Labs/ tests ordered today include:  No orders of the defined types were placed in this encounter.    Disposition:   FU with Dr. Domenic Polite, or myself when necessary  Signed, Keith Olson  12/19/2015 8:10 AM    [place] Phone: [phone]; Fax: [phone]  This note was written with the assistance of speech recognition software. Please excuse any transcriptional errors.

## 2015-12-23 ENCOUNTER — Telehealth: Payer: Self-pay | Admitting: Family Medicine

## 2015-12-23 NOTE — Telephone Encounter (Signed)
Patient needs a refill of Nasonex sent to mail order pharmacy

## 2015-12-23 NOTE — Telephone Encounter (Signed)
Please send to mail in order pharmacy. We only have [facility] and [facility] in [place]

## 2015-12-23 NOTE — Telephone Encounter (Signed)
TC to [facility]. I called them last week about other Rx's that we can not fill. But was not informed that the Flonase that we sent electronically was not received. Pharmacist was not available to take a verbal order on the 10/18 refill

## 2015-12-24 ENCOUNTER — Telehealth: Payer: Self-pay | Admitting: Physician Assistant

## 2015-12-24 NOTE — Telephone Encounter (Signed)
Returned call to patient.He stated he went to [place] yesterday B/P elevated 172/77 pulse 52.Stated he saw Rosaria Ferries PA 12/19/15 B/P alittle elevated 142/78 pulse 54.Stated she told him to call back if B/P elevated.He has a headache today.He has appointment with Dr.McDowell 01/06/16 at 1:20 pm at [facility] office.Advised I will send message to [place] for advice.

## 2015-12-24 NOTE — Telephone Encounter (Signed)
Noted. If his blood pressure is trending up, particularly with systolics averaging 357 or greater, we may need to add a medication to address this. He is currently not on any antihypertensives. He has been bradycardic at baseline. If he has a way of checking and recording his blood pressure at home, this would be useful, particularly leading up to his pending office visit.

## 2015-12-24 NOTE — Telephone Encounter (Signed)
New message      Pt c/o BP issue: STAT if pt c/o blurred vision, one-sided weakness or slurred speech  1. What are your last 5 BP readings? 172/77 at the [facility] yesterday, 172/77, 170/72 2. Are you having any other symptoms (ex. Dizziness, headache, blurred vision, passed out)?  headache 3. What is your BP issue?  Pt was instructed to call and give bp readings

## 2015-12-24 NOTE — Telephone Encounter (Signed)
Pt says he can monitor BP and HR at home and bring in readings at upcoming [place]. Pt will call back if symptoms develop or BP/HR worsens before OV.

## 2015-12-25 ENCOUNTER — Encounter: Payer: Self-pay | Admitting: *Deleted

## 2016-01-02 ENCOUNTER — Ambulatory Visit (INDEPENDENT_AMBULATORY_CARE_PROVIDER_SITE_OTHER): Payer: Medicare Other | Admitting: Nurse Practitioner

## 2016-01-02 ENCOUNTER — Encounter: Payer: Self-pay | Admitting: Nurse Practitioner

## 2016-01-02 ENCOUNTER — Ambulatory Visit (INDEPENDENT_AMBULATORY_CARE_PROVIDER_SITE_OTHER): Payer: Medicare Other

## 2016-01-02 VITALS — BP 144/84 | HR 54 | Temp 98.0°F | Ht 68.0 in | Wt 160.0 lb

## 2016-01-02 DIAGNOSIS — M25511 Pain in right shoulder: Secondary | ICD-10-CM | POA: Diagnosis not present

## 2016-01-02 IMAGING — DX DG SHOULDER 2+V*R*
3 series · 3 of 3 positions shown · non-contrast
Comparison: None.

CLINICAL DATA: Right shoulder pain, no acute injury, initial
encounter

EXAM:
RIGHT SHOULDER - 2+ VIEW

[shoulder ap]
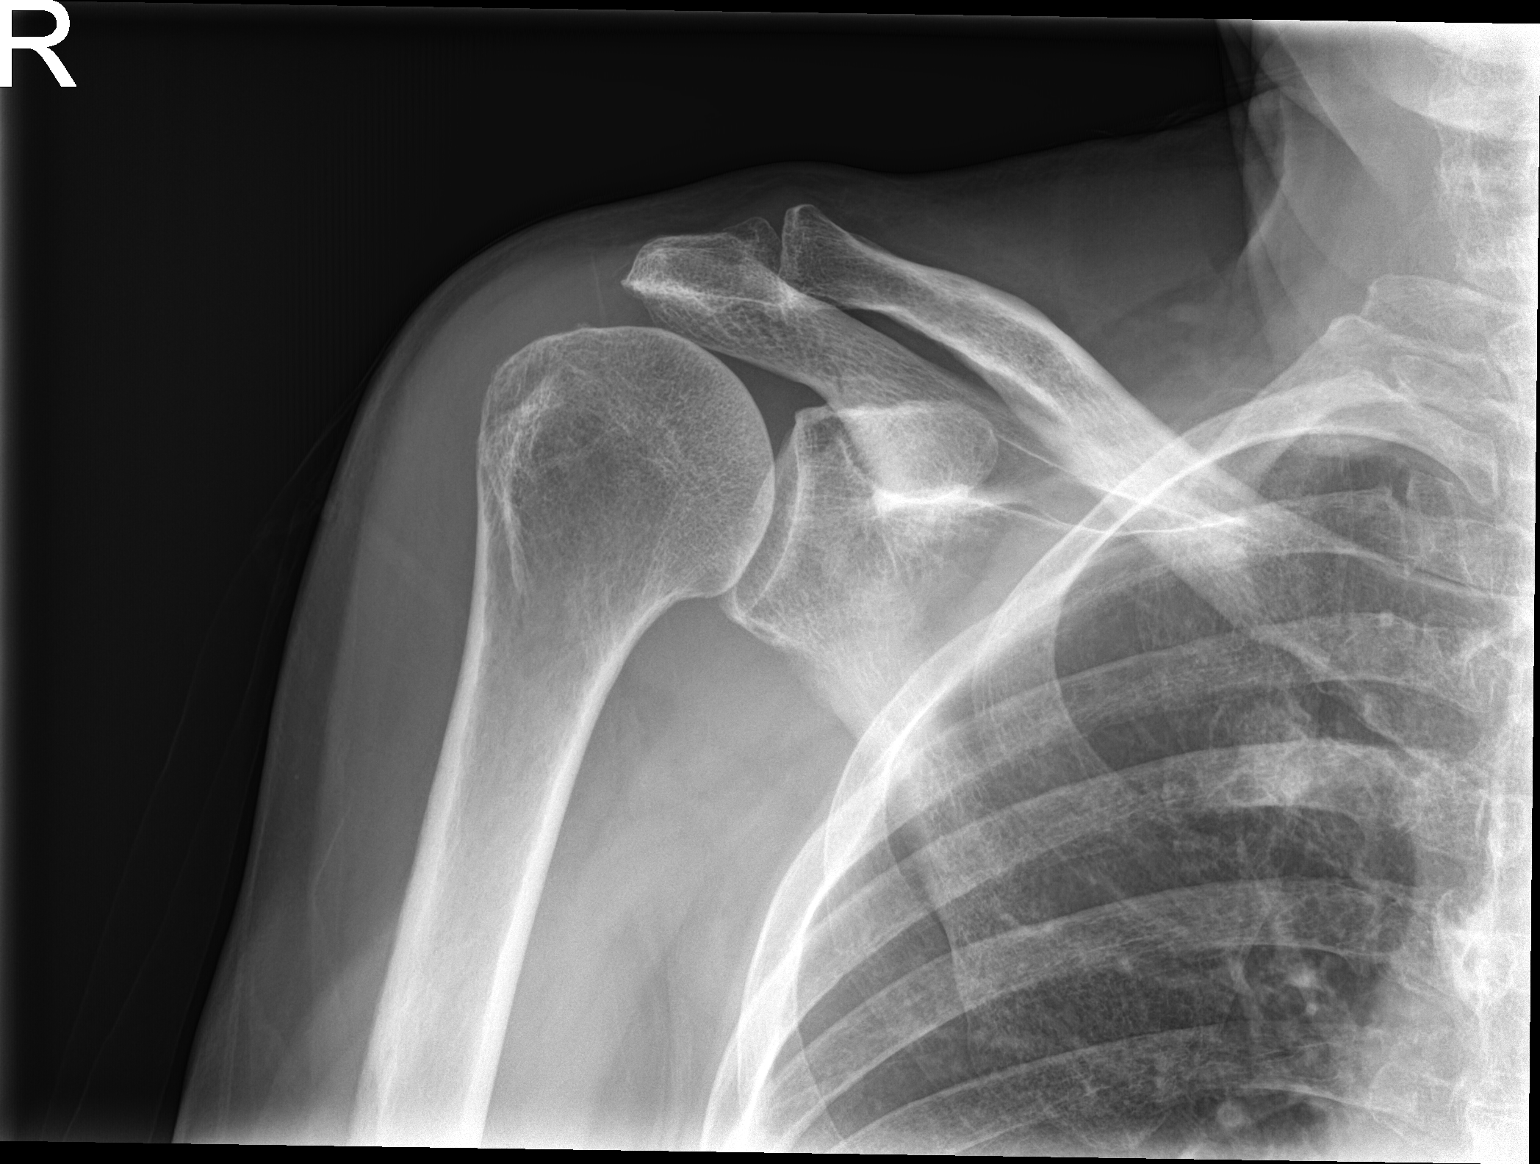

[shoulder obl]
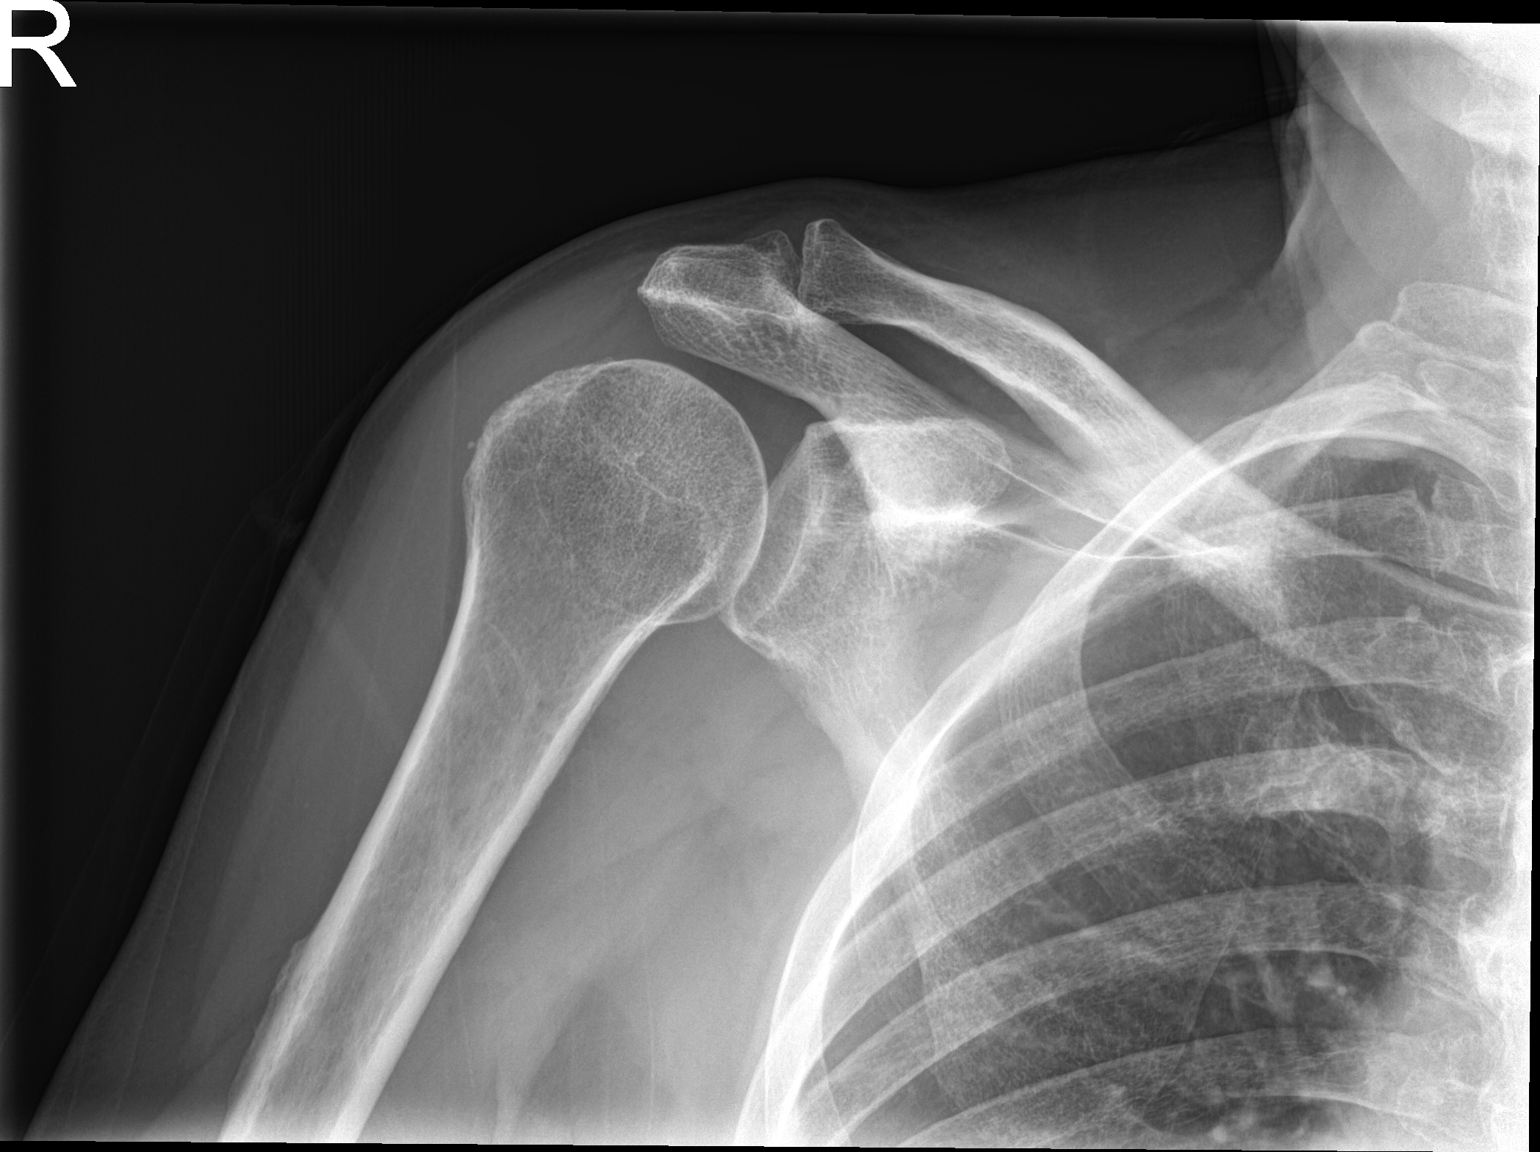

[shoulder axial]
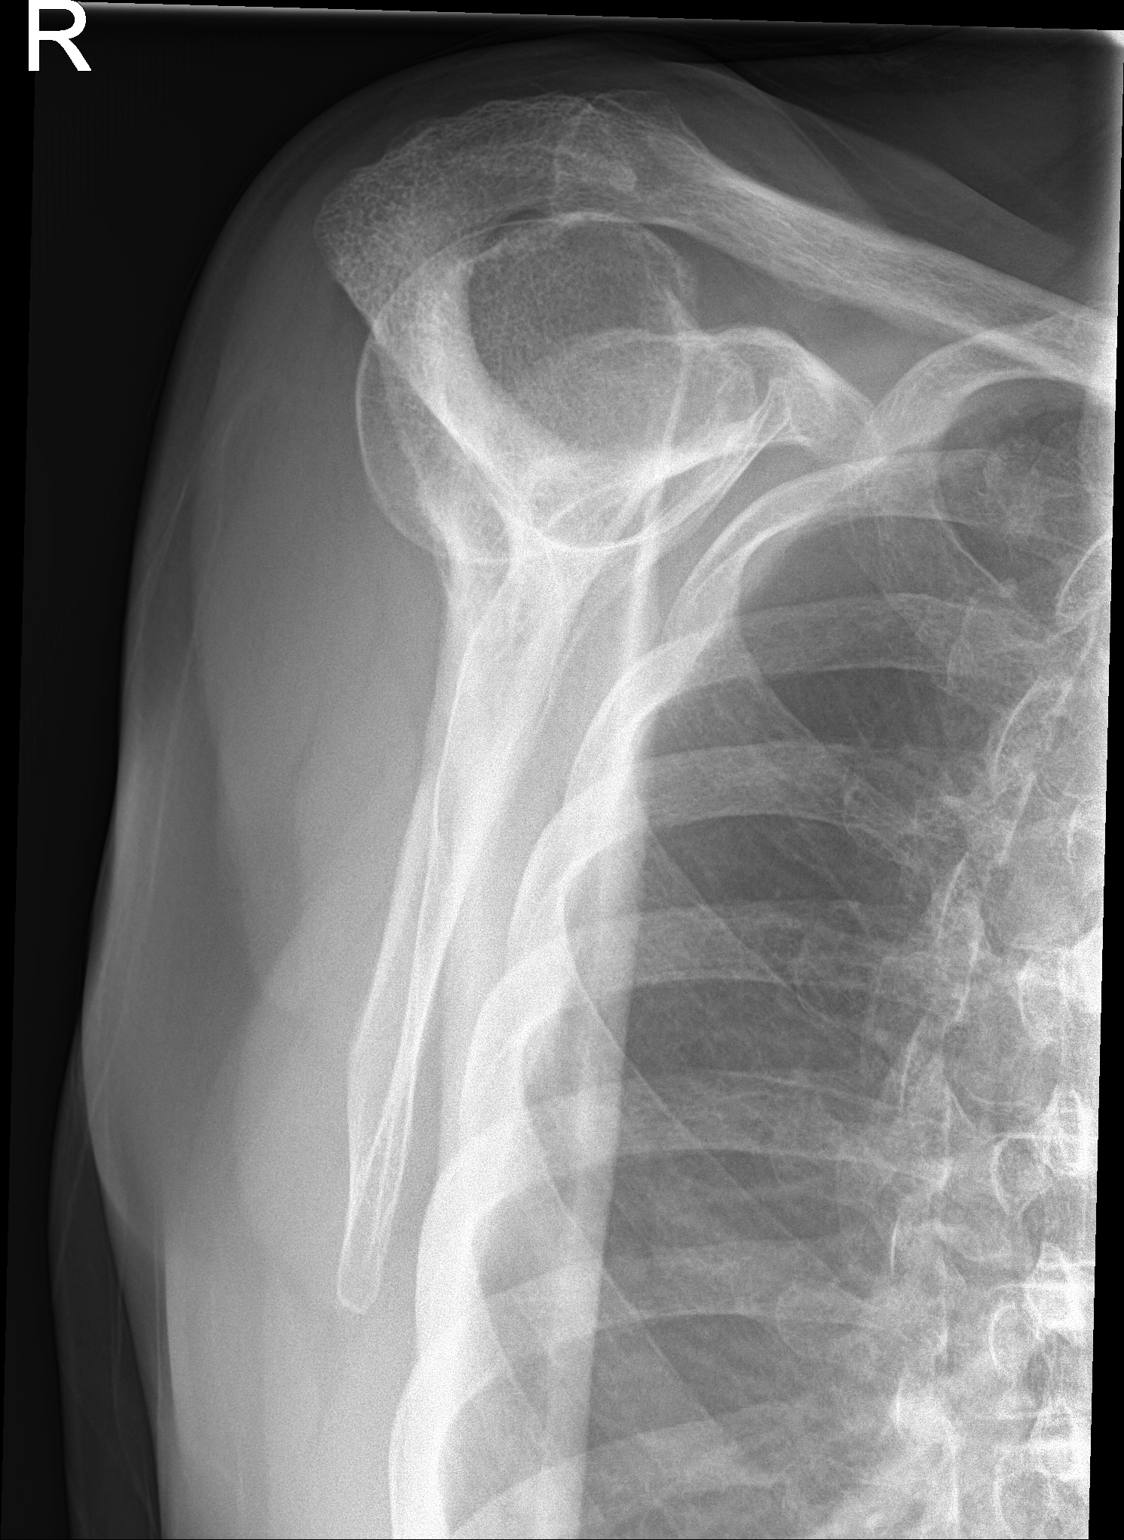

[3 of 3 positions shown; findings below may reference images not displayed]

FINDINGS: Degenerative changes of the acromioclavicular joint are noted. No
acute fracture or dislocation is seen. No gross soft tissue
abnormality is noted.
IMPRESSION: Degenerative changes without acute abnormality.

## 2016-01-02 MED ORDER — PREDNISONE 10 MG (21) PO TBPK: ORAL_TABLET | ORAL | 0 refills | Status: DC

## 2016-01-02 MED ORDER — TRAMADOL HCL 50 MG PO TABS: 50.0000 mg | ORAL_TABLET | Freq: Three times a day (TID) | ORAL | 0 refills | Status: DC | PRN

## 2016-01-02 NOTE — Patient Instructions (Signed)
Shoulder Pain The shoulder is the joint that connects your arm to your body. Muscles and band-like tissues that connect bones to muscles (tendons) hold the joint together. Shoulder pain is felt if an injury or medical problem affects one or more parts of the shoulder. HOME CARE   Put ice on the sore area.  Put ice in a plastic bag.  Place a towel between your skin and the bag.  Leave the ice on for 15-20 minutes, 03-04 times a day for the first 2 days.  Stop using cold packs if they do not help with the pain.  If you were given something to keep your shoulder from moving (sling; shoulder immobilizer), wear it as told. Only take it off to shower or bathe.  Move your arm as little as possible, but keep your hand moving to prevent puffiness (swelling).  Squeeze a soft ball or foam pad as much as possible to help prevent swelling.  Take medicine as told by your doctor. GET HELP IF:  You have progressing new pain in your arm, hand, or fingers.  Your hand or fingers get cold.  Your medicine does not help lessen your pain. GET HELP RIGHT AWAY IF:   Your arm, hand, or fingers are numb or tingling.  Your arm, hand, or fingers are puffy (swollen), painful, or turn white or blue. MAKE SURE YOU:   Understand these instructions.  Will watch your condition.  Will get help right away if you are not doing well or get worse.   This information is not intended to replace advice given to you by your health care provider. Make sure you discuss any questions you have with your health care provider.   Document Released: 08/04/2007 Document Revised: 03/08/2014 Document Reviewed: 06/10/2014 Elsevier Interactive Patient Education [employer].

## 2016-01-02 NOTE — Progress Notes (Addendum)
   Subjective:    Patient ID: Keith Olson., male    DOB: 01-Feb-1941, 75 y.o.   MRN: [id number]  HPI Patient in c/o of right shoulder pain- started 2 months ago and is gradually getting worse- throbs most of the day and will increase in pain scale when he tries to use it. Now he can't even comb his hair without it hurting. He has tried tylenol and ibuprofen OTC and it does not help. Denies injury.    Review of Systems  Constitutional: Negative.   HENT: Negative.   Respiratory: Negative.   Cardiovascular: Negative.   Genitourinary: Negative.   Musculoskeletal:       Right shoulder pain.  Neurological: Negative.   Psychiatric/Behavioral: Negative.   All other systems reviewed and are negative.      Objective:   Physical Exam  Constitutional: He is oriented to person, place, and time. He appears well-developed and well-nourished. No distress.  Cardiovascular: Normal rate, regular rhythm and normal heart sounds.   Pulmonary/Chest: Effort normal and breath sounds normal.  Musculoskeletal:  Decreased ROM due to pain with shoulder joint rotation and abduction  Neurological: He is alert and oriented to person, place, and time.  Skin: Skin is warm.  Psychiatric: He has a normal mood and affect. His behavior is normal. Judgment and thought content normal.   BP (!) 144/84 (BP Location: Left Arm, Cuff Size: Normal)   Pulse (!) 54   Temp 98 F (36.7 C) (Oral)   Ht '5\' 8"'$  (1.727 m)   Wt 160 lb (72.6 kg)   BMI 24.33 kg/m         Assessment & Plan:  1. Acute pain of right shoulder Ice Limited movement - DG Shoulder Right; Future - Ambulatory referral to Orthopedic Surgery Meds ordered this encounter  Medications  . predniSONE (STERAPRED UNI-PAK 21 TAB) 10 MG (21) TBPK tablet    Sig: As directed x 6 days    Dispense:  21 tablet    Refill:  0    Order Specific Question:   Supervising Provider    Answer:   VINCENT, CAROL L [[id number]]  . traMADol (ULTRAM) 50 MG tablet    Sig:  Take 1 tablet (50 mg total) by mouth every 8 (eight) hours as needed.    Dispense:  30 tablet    Refill:  0    Order Specific Question:   Supervising Provider    Answer:   Eustaquio Maize [[id number]]    Mary-Margaret Hassell Done, FNP

## 2016-01-02 NOTE — Addendum Note (Signed)
Addended by: Chevis Pretty on: 01/02/2016 02:47 PM   Modules accepted: Orders

## 2016-01-06 ENCOUNTER — Encounter: Payer: Medicare Other | Admitting: Cardiology

## 2016-01-07 NOTE — Progress Notes (Signed)
Cardiology Office Note  Date: 01/08/2016   ID: Hammond Toniann Fail., DOB 07-19-1940, MRN [id number]  PCP: Worthy Rancher, MD  Primary Cardiologist: Rozann Lesches, MD   Chief Complaint  Patient presents with  . Coronary Artery Disease    History of Present Illness: Keith Olson. is a 75 y.o. male seen in consultation back in October and referred for a diagnostic cardiac catheterization in light of dyspnea on exertion and recurring chest pain. Procedure was performed by Dr. Martinique on October 13 revealing 85% stenosis in the proximal to mid LAD which was treated with DES that overlapped the prior LAD stent.  He is here today with his wife for a follow-up visit. States that he feels significantly better, more energy, has been working outside and plans to do more walking for exercise. He reports compliance with his medications which were reviewed today. He does need a refill for Plavix. We discussed the importance of DAPT. He has had no bleeding problems.  Past Medical History:  Diagnosis Date  . Agent orange exposure   . Arthritis   . Coronary artery disease    BMS to mid LAD 2001  . Enlarged prostate    XRT 2016  . Headache    Sinus headaches   . History of depression   . Hyperlipidemia   . Prostate cancer ([place])   . PTSD (post-traumatic stress disorder)     Past Surgical History:  Procedure Laterality Date  . CARDIAC CATHETERIZATION N/A 12/12/2015   Procedure: Left Heart Cath and Coronary Angiography;  Surgeon: Peter M Martinique, MD;  Location: [facility];  Service: Cardiovascular;  Laterality: N/A;  . CARDIAC CATHETERIZATION N/A 12/12/2015   Procedure: Coronary Stent Intervention;  Surgeon: Peter M Martinique, MD;  Location: [facility];  Service: Cardiovascular;  Laterality: N/A;  . HERNIA REPAIR Right   . PROSTATE BIOPSY N/A 08/28/2014   Procedure: BIOPSY TRANSRECTAL ULTRASONIC PROSTATE (TUBP);  Surgeon: Rana Snare, MD;  Location: [facility];  Service: Urology;   Laterality: N/A;  . TRANSURETHRAL RESECTION OF PROSTATE N/A 08/28/2014   Procedure: TRANSURETHRAL RESECTION OF THE PROSTATE WITH GYRUS INSTRUMENTS;  Surgeon: Rana Snare, MD;  Location: [facility];  Service: Urology;  Laterality: N/A;    Current Outpatient Prescriptions  Medication Sig Dispense Refill  . ALPRAZolam (XANAX) 0.5 MG tablet Take 1 tablet (0.5 mg total) by mouth 2 (two) times daily as needed for anxiety. 60 tablet 0  . aspirin EC 81 MG tablet Take 1 tablet (81 mg total) by mouth daily.    . clopidogrel (PLAVIX) 75 MG tablet Take 1 tablet (75 mg total) by mouth daily. 30 tablet 0  . fluticasone (FLONASE) 50 MCG/ACT nasal spray Place 1 spray into both nostrils 2 (two) times daily as needed for allergies or rhinitis. 16 g 6  . levothyroxine (SYNTHROID, LEVOTHROID) 75 MCG tablet Take 75 mcg by mouth daily before breakfast. hasnt been taking because needs to be refilled and then will start back .    . nitroGLYCERIN (NITROSTAT) 0.4 MG SL tablet Place 0.4 mg under the tongue every 5 (five) minutes as needed for chest pain. X 3 doses    . predniSONE (STERAPRED UNI-PAK 21 TAB) 10 MG (21) TBPK tablet As directed x 6 days 21 tablet 0  . rosuvastatin (CRESTOR) 20 MG tablet Take 1 tablet (20 mg total) by mouth daily. 30 tablet 11  . tamsulosin (FLOMAX) 0.4 MG CAPS capsule Take 0.4 mg by mouth 2 (two)  times daily.    . traMADol (ULTRAM) 50 MG tablet Take 1 tablet (50 mg total) by mouth every 8 (eight) hours as needed. 30 tablet 0   No current facility-administered medications for this visit.    Allergies:  Lipitor [atorvastatin]   Social History: The patient  reports that he has never smoked. He has quit using smokeless tobacco. His smokeless tobacco use included Chew. He reports that he does not drink alcohol or use drugs.   ROS:  Please see the history of present illness. Otherwise, complete review of systems is positive for none.  All other systems are reviewed and negative.   Physical  Exam: VS:  BP 122/68   Pulse 63   Ht '5\' 8"'$  (1.727 m)   Wt 161 lb (73 kg)   SpO2 98%   BMI 24.48 kg/m , BMI Body mass index is 24.48 kg/m.  Wt Readings from Last 3 Encounters:  01/08/16 161 lb (73 kg)  01/02/16 160 lb (72.6 kg)  12/19/15 158 lb 3.2 oz (71.8 kg)    General: Patient appears comfortable at rest. HEENT: Conjunctiva and lids normal, oropharynx clear. Neck: Supple, no elevated JVP or carotid bruits, no thyromegaly. Lungs: Clear to auscultation, nonlabored breathing at rest. Cardiac: Regular rate and rhythm, no S3 or significant systolic murmur, no pericardial rub. Abdomen: Soft, nontender, bowel sounds present, no guarding or rebound. Extremities: No pitting edema, distal pulses 2+. Skin: Warm and dry. Musculoskeletal: No kyphosis. Neuropsychiatric: Alert and oriented x3, affect grossly appropriate.  ECG: I personally reviewed the tracing from 12/12/2015 which showed sinus bradycardia with leftward axis.  Recent Labwork: 12/12/2015: BUN 9; Creatinine, Ser 0.74; Hemoglobin 12.8; Platelets 228; Potassium 3.9; Sodium 139   Other Studies Reviewed Today:  Cardiac catheterization 12/12/2015:  Prox LAD lesion, 80 %stenosed.  RPDA lesion, 50 %stenosed.  The left ventricular systolic function is normal.  LV end diastolic pressure is normal.  The left ventricular ejection fraction is 55-65% by visual estimate.  A STENT PROMUS PREM MR 3.0X12 drug eluting stent was successfully placed, and overlaps previously placed stent.  Prox LAD to Mid LAD lesion, 85 %stenosed.  Post intervention, there is a 0% residual stenosis.   1. Single vessel obstructive CAD. There is a stenosis at the proximal margin of the old stent.  2. Normal LV function and normal LV EDP 3. Successful stenting of the proximal LAD with DES- overlapping his old stent  Assessment and Plan:  1. CAD status post BMS to the LAD in 2001 with more recent placement of DES to the LAD as outlined above. He is  significantly better in terms of energy and angina control at this point. We will continue with medical therapy, refill provided for Plavix. Encouraged regular walking plan for exercise.  2. Hyperlipidemia, continues on Crestor. Keep follow-up with PCP.  3. History of anxiety and PTSD. He uses Xanax intermittently and follows at the [facility] in [place].  4. Prior history of statin intolerance, although has been able to take Crestor so far.  Current medicines were reviewed with the patient today.   Disposition: Follow-up in 6 months.  Signed, Satira Sark, MD, [facility] 01/08/2016 9:48 AM    [facility] [place], [place], [place] [address] Phone: [phone]; Fax: [phone]

## 2016-01-08 ENCOUNTER — Ambulatory Visit (INDEPENDENT_AMBULATORY_CARE_PROVIDER_SITE_OTHER): Payer: Medicare Other | Admitting: Cardiology

## 2016-01-08 ENCOUNTER — Encounter: Payer: Self-pay | Admitting: Cardiology

## 2016-01-08 VITALS — BP 122/68 | HR 63 | Ht 68.0 in | Wt 161.0 lb

## 2016-01-08 DIAGNOSIS — Z789 Other specified health status: Secondary | ICD-10-CM | POA: Diagnosis not present

## 2016-01-08 DIAGNOSIS — I2510 Atherosclerotic heart disease of native coronary artery without angina pectoris: Secondary | ICD-10-CM

## 2016-01-08 DIAGNOSIS — E782 Mixed hyperlipidemia: Secondary | ICD-10-CM | POA: Diagnosis not present

## 2016-01-08 DIAGNOSIS — Z955 Presence of coronary angioplasty implant and graft: Secondary | ICD-10-CM | POA: Diagnosis not present

## 2016-01-08 MED ORDER — CLOPIDOGREL BISULFATE 75 MG PO TABS: 75.0000 mg | ORAL_TABLET | Freq: Every day | ORAL | 3 refills | Status: DC

## 2016-01-08 NOTE — Patient Instructions (Signed)

## 2016-01-23 ENCOUNTER — Encounter: Payer: Self-pay | Admitting: Family Medicine

## 2016-01-23 ENCOUNTER — Ambulatory Visit: Payer: Medicare Other | Admitting: Family Medicine

## 2016-01-23 ENCOUNTER — Ambulatory Visit (INDEPENDENT_AMBULATORY_CARE_PROVIDER_SITE_OTHER): Payer: Medicare Other | Admitting: Family Medicine

## 2016-01-23 VITALS — BP 130/77 | HR 57 | Temp 97.0°F | Ht 68.0 in | Wt 160.0 lb

## 2016-01-23 DIAGNOSIS — L732 Hidradenitis suppurativa: Secondary | ICD-10-CM | POA: Diagnosis not present

## 2016-01-23 MED ORDER — SULFAMETHOXAZOLE-TRIMETHOPRIM 800-160 MG PO TABS: 1.0000 | ORAL_TABLET | Freq: Two times a day (BID) | ORAL | 0 refills | Status: DC

## 2016-01-23 NOTE — Progress Notes (Signed)
   BP 130/77   Pulse (!) 57   Temp 97 F (36.1 C) (Oral)   Ht '5\' 8"'$  (1.727 m)   Wt 160 lb (72.6 kg)   BMI 24.33 kg/m    Subjective:    Patient ID: Keith Toniann Fail., male    DOB: 1940/07/21, 75 y.o.   MRN: [id number]  HPI: Keith Antinio Sanderfer. is a 75 y.o. male presenting on 01/23/2016 for cyst left axillary   HPI Inflamed cyst This is the second time that he has had inflamed and swollen cysts or glands under his left axilla. He denies any fevers or chills. He has had some drainage out of them and the first and then he had a was able to express it and get it to go away on its own. This time he has 4 spots that are swollen and inflamed under that left axilla. He denies any anywhere else. He says they are very painful to palpation. The first time that he had it was a month ago. These ones have been increasing over the past few days.  Relevant past medical, surgical, family and social history reviewed and updated as indicated. Interim medical history since our last visit reviewed. Allergies and medications reviewed and updated.  Review of Systems  Constitutional: Negative for chills and fever.  Respiratory: Negative for shortness of breath and wheezing.   Cardiovascular: Negative for chest pain and leg swelling.  Musculoskeletal: Negative for back pain and gait problem.  Skin: Positive for color change and rash.  All other systems reviewed and are negative.   Per HPI unless specifically indicated above     Objective:    BP 130/77   Pulse (!) 57   Temp 97 F (36.1 C) (Oral)   Ht '5\' 8"'$  (1.727 m)   Wt 160 lb (72.6 kg)   BMI 24.33 kg/m   Wt Readings from Last 3 Encounters:  01/23/16 160 lb (72.6 kg)  01/08/16 161 lb (73 kg)  01/02/16 160 lb (72.6 kg)    Physical Exam  Constitutional: He is oriented to person, place, and time. He appears well-developed and well-nourished. No distress.  Eyes: Conjunctivae are normal. Right eye exhibits no discharge. No scleral icterus.    Pulmonary/Chest: Breath sounds normal.  Musculoskeletal: Normal range of motion. He exhibits no edema.  Neurological: He is alert and oriented to person, place, and time. Coordination normal.  Skin: Skin is warm and dry. Lesion (Swollen and inflamed nodules under left axilla. 2 of them and come to a head and are draining white thick discharge. Very tender to palpation. All of them are less than half a centimeter in size) noted. No rash noted. He is not diaphoretic.  Psychiatric: He has a normal mood and affect. His behavior is normal.  Nursing note and vitals reviewed.     Assessment & Plan:   Problem List Items Addressed This Visit    None    Visit Diagnoses    Hidradenitis suppurativa    -  Primary   Left axilla, 4 areas, will send Bactrim and wound culture because draining   Relevant Medications   sulfamethoxazole-trimethoprim (BACTRIM DS,SEPTRA DS) 800-160 MG tablet       Follow up plan: Return if symptoms worsen or fail to improve.  Counseling provided for all of the vaccine components No orders of the defined types were placed in this encounter.   Caryl Pina, MD [facility] 01/23/2016, 9:36 AM

## 2016-01-27 ENCOUNTER — Encounter: Payer: Self-pay | Admitting: Family Medicine

## 2016-01-27 ENCOUNTER — Ambulatory Visit (INDEPENDENT_AMBULATORY_CARE_PROVIDER_SITE_OTHER): Payer: Medicare Other | Admitting: Family Medicine

## 2016-01-27 VITALS — BP 123/70 | HR 66 | Temp 97.0°F | Ht 68.0 in | Wt 157.0 lb

## 2016-01-27 DIAGNOSIS — F411 Generalized anxiety disorder: Secondary | ICD-10-CM

## 2016-01-27 DIAGNOSIS — Z0289 Encounter for other administrative examinations: Secondary | ICD-10-CM | POA: Diagnosis not present

## 2016-01-27 DIAGNOSIS — I2510 Atherosclerotic heart disease of native coronary artery without angina pectoris: Secondary | ICD-10-CM

## 2016-01-27 LAB — ANAEROBIC AND AEROBIC CULTURE

## 2016-01-27 MED ORDER — ALPRAZOLAM 1 MG PO TABS: 1.0000 mg | ORAL_TABLET | Freq: Every day | ORAL | 2 refills | Status: DC

## 2016-01-27 NOTE — Progress Notes (Signed)
   Subjective:    Patient ID: Keith Olson., male    DOB: [birth year], 75 y.o.   MRN: [id number]  HPI patient given Xanax 0.05 mg at last visit. He had previously been on 1 mg and does not feel like the 0.5 mg R is effective. He is nervous ostensibly as he is raising grandchildren at age 17.  Patient Active Problem List   Diagnosis Date Noted  . Accelerating angina ([place]) 12/12/2015  . CAD in native artery 12/12/2015  . Constipation 04/22/2015  . Anxiety state 12/16/2014  . Malignant neoplasm of prostate ([place]) 09/19/2014  . 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 Hyperlipidemia 05/22/2012  . BPH (benign prostatic hyperplasia) 05/22/2012   Outpatient Encounter Prescriptions as of 01/27/2016  Medication Sig  . ALPRAZolam (XANAX) 0.5 MG tablet Take 1 tablet (0.5 mg total) by mouth 2 (two) times daily as needed for anxiety.  Marland Kitchen aspirin EC 81 MG tablet Take 1 tablet (81 mg total) by mouth daily.  . clopidogrel (PLAVIX) 75 MG tablet Take 1 tablet (75 mg total) by mouth daily.  . fluticasone (FLONASE) 50 MCG/ACT nasal spray Place 1 spray into both nostrils 2 (two) times daily as needed for allergies or rhinitis.  Marland Kitchen levothyroxine (SYNTHROID, LEVOTHROID) 75 MCG tablet Take 75 mcg by mouth daily before breakfast. hasnt been taking because needs to be refilled and then will start back .  . nitroGLYCERIN (NITROSTAT) 0.4 MG SL tablet Place 0.4 mg under the tongue every 5 (five) minutes as needed for chest pain. X 3 doses  . rosuvastatin (CRESTOR) 20 MG tablet Take 1 tablet (20 mg total) by mouth daily.  . tamsulosin (FLOMAX) 0.4 MG CAPS capsule Take 0.4 mg by mouth 2 (two) times daily.  . traMADol (ULTRAM) 50 MG tablet Take 1 tablet (50 mg total) by mouth every 8 (eight) hours as needed.  . [DISCONTINUED] sulfamethoxazole-trimethoprim (BACTRIM DS,SEPTRA DS) 800-160 MG tablet Take 1 tablet by mouth 2 (two) times daily.   No facility-administered encounter medications on file as of 01/27/2016.        Review of Systems  Psychiatric/Behavioral: The patient is nervous/anxious.   All other systems reviewed and are negative.      Objective:   Physical Exam  Constitutional: He appears well-developed and well-nourished.  Cardiovascular: Normal rate and regular rhythm.   Pulmonary/Chest: Effort normal.  Neurological:  Patient is visibly shaky (not a tremor)  Psychiatric:  Speech is somewhat halting consistent with extreme anxiety   BP 123/70   Pulse 66   Temp 97 F (36.1 C) (Oral)   Ht '5\' 8"'$  (1.727 m)   Wt 157 lb (71.2 kg)   BMI 23.87 kg/m         Assessment & Plan:  1. Pain management contract signed Contract signed due to use of benzodiazepine  2. Anxiety state Xanax 1 mg daily. I do not think he is abusive  3. CAD in native artery Second stent placed about 2 months ago. Asymptomatic at this time Wardell Honour MD

## 2016-02-04 ENCOUNTER — Ambulatory Visit: Payer: Medicare Other | Admitting: Orthopedic Surgery

## 2016-02-04 ENCOUNTER — Encounter: Payer: Self-pay | Admitting: Orthopedic Surgery

## 2016-02-17 DIAGNOSIS — R3912 Poor urinary stream: Secondary | ICD-10-CM | POA: Diagnosis not present

## 2016-02-17 DIAGNOSIS — K625 Hemorrhage of anus and rectum: Secondary | ICD-10-CM | POA: Diagnosis not present

## 2016-02-25 ENCOUNTER — Other Ambulatory Visit: Payer: Self-pay | Admitting: Pharmacist

## 2016-02-25 ENCOUNTER — Ambulatory Visit (INDEPENDENT_AMBULATORY_CARE_PROVIDER_SITE_OTHER): Payer: Medicare Other | Admitting: Gastroenterology

## 2016-02-25 ENCOUNTER — Encounter: Payer: Self-pay | Admitting: Gastroenterology

## 2016-02-25 VITALS — BP 131/80 | HR 62 | Temp 97.3°F | Ht 67.5 in | Wt 160.6 lb

## 2016-02-25 DIAGNOSIS — K625 Hemorrhage of anus and rectum: Secondary | ICD-10-CM | POA: Diagnosis not present

## 2016-02-25 DIAGNOSIS — K5900 Constipation, unspecified: Secondary | ICD-10-CM | POA: Diagnosis not present

## 2016-02-25 DIAGNOSIS — R1030 Lower abdominal pain, unspecified: Secondary | ICD-10-CM | POA: Diagnosis not present

## 2016-02-25 MED ORDER — LUBIPROSTONE 24 MCG PO CAPS: 24.0000 ug | ORAL_CAPSULE | Freq: Two times a day (BID) | ORAL | 5 refills | Status: DC

## 2016-02-25 NOTE — Patient Instructions (Addendum)
1. For constipation, take Amitiza 67mg twice daily with food. Samples and RX provided.  2. Stop Miralax for now. 3. I will discuss possibility of colonoscopy with Dr. RGala Romneyand let you know recommendations in light of recent cardiac stent placement.

## 2016-02-25 NOTE — Patient Outreach (Signed)
Outreach call to [employer]. regarding medication adherence to rosuvastatin. Called and spoke with patient. HIPAA identifiers verified and verbal consent received.  [name] reports that he takes rosuvastatin, but that recently he has stopped the medication for a couple of days. Reports that he has been having muscle pain and that he has a history of severe muscle pain with atorvastatin. Reports that he wanted to see if stopping the medication would relieve the symptom. However, [name] reports no change in his muscle pain. Patient reports that he has not discussed this new muscle pain with his PCP. Counsel patient on the importance of communicating with his PCP and the importance of medication adherence. Patient verbalizes understanding and states that he will call to follow up with his PCP.  [name] reports that he has no further medication questions/concerns at this time. Provide patient with my phone number.  Harlow Asa, PharmD, [facility] [phone]

## 2016-02-25 NOTE — Assessment & Plan Note (Signed)
75 year old gentleman with history of prostate cancer status post external beam radiation therapy last year/hormonal therapy who presents for recent episode of rectal bleeding/passage of blood clots. Chronically constipated, taking MiraLAX daily with moderate relief. Has had some rectal pain with defecation. Chronic low abdominal pain since prior to his prostate cancer diagnosis.  Bleeding possibly related to radiation induced proctitis, anorectal fissure, hemorrhoids but cannot exclude malignancy. Patient reports last colonoscopy over "40 years ago". He is on Plavix and aspirin for CAD with recent stenting in October. I will discuss further with Dr. Gala Romney. Potentially offer diagnostic only colonoscopy to determine source of bleeding, exclude malignancy in light of recent coronary artery stents. Prefer not to hold off on diagnostic evaluation until October of next year when he can come off of Plavix. Issue really becomes potential risk of bleeding status post therapeutic measures such as polypectomy in the setting of Plavix and not wanting to run the risk of having a hold Plavix if he were to have significant bleeding episode.  We will manage his constipation. I have asked to stop MiraLAX, begin an Amitiza 24 g twice a day. If he continues to have ongoing abdominal pain, he may require CT imaging. Will determine need for follow-up CBC based on plan.

## 2016-02-25 NOTE — Progress Notes (Signed)
Primary Care Physician:  Worthy Rancher, MD  Referring Physician: Dr. Risa Grill, [facility] Primary Gastroenterologist:  Garfield Cornea, MD   Chief Complaint  Patient presents with  . Rectal Bleeding    HPI:  Keith Harlem Bula. is a 75 y.o. male here at the request of [facility], Dr. Risa Grill, for Further evaluation of rectal bleeding. Patient was seen at [facility] urology last week for complaints of rectal bleeding. Symptoms occurred spontaneously while walking. Reports passing 4-5/4-sized clots that were dark in appearance. Noted fresh blood per rectum after that. He has had some toilet tissue hematochezia since then. Nothing significant. He states he's had issues with hemorrhoids for years. He's had issues with constipation since he underwent radiation treatments for prostate cancer last year. He's been taking MiraLAX daily. Still has to strain to have a bowel movement frequently. Couple weeks ago he went more than 6 days without a BM. This was one week prior to him having rectal bleeding. He denies melena. He has chronic low abdominal pain which is constant. Unrelated to meals. Worse if he straining to have a bowel movement. Denies upper GI symptoms. No heartburn. No dysphagia. No vomiting. No unintentional weight loss. He has had some rectal pain with having bowel movement. Mild pain on rectal exam last week. No abnormalities noted on rectal exam at [facility].  Patient underwent coronary artery stenting in October 2017. Placed on Plavix at that time. Also on aspirin chronically.     Current Outpatient Prescriptions  Medication Sig Dispense Refill  . ALPRAZolam (XANAX) 1 MG tablet Take 1 tablet (1 mg total) by mouth daily. 30 tablet 2  . aspirin EC 81 MG tablet Take 1 tablet (81 mg total) by mouth daily.    . clopidogrel (PLAVIX) 75 MG tablet Take 1 tablet (75 mg total) by mouth daily. 90 tablet 3  . fluticasone (FLONASE) 50 MCG/ACT nasal spray Place 1 spray into both  nostrils 2 (two) times daily as needed for allergies or rhinitis. 16 g 6  . levothyroxine (SYNTHROID, LEVOTHROID) 75 MCG tablet Take 75 mcg by mouth daily before breakfast. hasnt been taking because needs to be refilled and then will start back .    . nitroGLYCERIN (NITROSTAT) 0.4 MG SL tablet Place 0.4 mg under the tongue every 5 (five) minutes as needed for chest pain. X 3 doses    . rosuvastatin (CRESTOR) 20 MG tablet Take 1 tablet (20 mg total) by mouth daily. 30 tablet 11   No current facility-administered medications for this visit.     Allergies as of 02/25/2016 - Review Complete 02/25/2016  Allergen Reaction Noted  . Lipitor [atorvastatin]  05/22/2012    Past Medical History:  Diagnosis Date  . Agent orange exposure   . Arthritis   . Coronary artery disease    BMS to mid LAD 2001  . Enlarged prostate    XRT 2016  . Headache    Sinus headaches   . History of depression   . Hyperlipidemia   . Prostate cancer ([place]) 07/2014   hormonal therapy, external beam radiation therapy  . PTSD (post-traumatic stress disorder)     Past Surgical History:  Procedure Laterality Date  . CARDIAC CATHETERIZATION N/A 12/12/2015   Procedure: Left Heart Cath and Coronary Angiography;  Surgeon: Peter M Martinique, MD;  Location: [place] [facility];  Service: Cardiovascular;  Laterality: N/A;  . CARDIAC CATHETERIZATION N/A 12/12/2015   Procedure: Coronary Stent Intervention;  Surgeon: Peter M Martinique, MD;  Location:  [place] [facility];  Service: Cardiovascular;  Laterality: N/A;  . HERNIA REPAIR Right   . PROSTATE BIOPSY N/A 08/28/2014   Procedure: BIOPSY TRANSRECTAL ULTRASONIC PROSTATE (TUBP);  Surgeon: Rana Snare, MD;  [facility];  Service: Urology;  Laterality: N/A;  . TRANSURETHRAL RESECTION OF PROSTATE N/A 08/28/2014   Procedure: TRANSURETHRAL RESECTION OF THE PROSTATE WITH GYRUS INSTRUMENTS;  Surgeon: Rana Snare, MD;  Location[facility];  Service: Urology;  Laterality: N/A;    Family  History  Problem Relation Age of Onset  . Arthritis Mother     Social History   Social History  . Marital status: Married    Spouse name: N/A  . Number of children: N/A  . Years of education: N/A   Occupational History  . retired    Social History Main Topics  . Smoking status: Never Smoker  . Smokeless tobacco: Former Systems developer    Types: Chew  . Alcohol use No  . Drug use: No  . Sexual activity: Yes   Other Topics Concern  . Not on file   Social History Narrative   Married   5 children      ROS:  General: Negative for anorexia, weight loss, fever, chills, fatigue, weakness. Eyes: Negative for vision changes.  ENT: Negative for hoarseness, difficulty swallowing , nasal congestion. CV: Negative for chest pain, angina, palpitations, dyspnea on exertion, peripheral edema.  Respiratory: Negative for dyspnea at rest, dyspnea on exertion, cough, sputum, wheezing.  GI: See history of present illness. GU:  Negative for dysuria, hematuria, urinary incontinence, positive urinary frequency, nocturnal urination.  MS: Negative for joint pain, low back pain.  Derm: Negative for rash or itching.  Neuro: Negative for weakness, abnormal sensation, seizure, frequent headaches, memory loss, confusion.  Psych: Negative for anxiety, depression, suicidal ideation, hallucinations.  Endo: Negative for unusual weight change.  Heme: Negative for bruising or bleeding. Allergy: Negative for rash or hives.    Physical Examination:  BP 131/80   Pulse 62   Temp 97.3 F (36.3 C) (Oral)   Ht 5' 7.5" (1.715 m)   Wt 160 lb 9.6 oz (72.8 kg)   BMI 24.78 kg/m    General: Well-nourished, well-developed in no acute distress. Accompanied by wife Head: Normocephalic, atraumatic.   Eyes: Conjunctiva pink, no icterus. Mouth: Oropharyngeal mucosa moist and pink , no lesions erythema or exudate. Neck: Supple without thyromegaly, masses, or lymphadenopathy.  Lungs: Clear to auscultation bilaterally.   Heart: Regular rate and rhythm, no murmurs rubs or gallops.  Abdomen: Bowel sounds are normal,  nondistended, no hepatosplenomegaly or masses, no abdominal bruits or    hernia , no rebound or guarding.  Mild to moderate suprapubic tenderness to deep palpation Rectal: Not performed  Extremities: No lower extremity edema. No clubbing or deformities.  Neuro: Alert and oriented x 4 , grossly normal neurologically.  Skin: Warm and dry, no rash or jaundice.   Psych: Alert and cooperative, normal mood and affect.  Labs: Lab Results  Component Value Date   WBC 6.3 12/12/2015   HGB 12.8 (L) 12/12/2015   HCT 38.3 (L) 12/12/2015   MCV 90.3 12/12/2015   PLT 228 12/12/2015   Lab Results  Component Value Date   CREATININE 0.74 12/12/2015   BUN 9 12/12/2015   NA 139 12/12/2015   K 3.9 12/12/2015   CL 105 12/12/2015   CO2 24 12/12/2015   Lab Results  Component Value Date   ALT 25 07/24/2014   AST 23 07/24/2014  ALKPHOS 69 07/24/2014   BILITOT 0.3 07/24/2014     Imaging Studies: No results found.

## 2016-02-25 NOTE — Progress Notes (Signed)
CC'D TO PCP °

## 2016-03-04 ENCOUNTER — Other Ambulatory Visit: Payer: Self-pay

## 2016-03-04 DIAGNOSIS — K625 Hemorrhage of anus and rectum: Secondary | ICD-10-CM

## 2016-03-04 DIAGNOSIS — R1032 Left lower quadrant pain: Secondary | ICD-10-CM

## 2016-03-04 MED ORDER — PEG 3350-KCL-NA BICARB-NACL 420 G PO SOLR: 4000.0000 mL | ORAL | 0 refills | Status: DC

## 2016-03-04 NOTE — Progress Notes (Addendum)
Please let patient know that RMR recommends proceeding with colonoscopy ON Plavix/ASA.  Please schedule. Dx: low abd pain, rectal bleeding Tell him to make sure regular BMs leading up to bowel prep. Started on Amitiza at [place], hopefully helping.   Also need cbc. Orders entered.

## 2016-03-04 NOTE — Progress Notes (Signed)
Pt is set up for TCS on 03/24/16 @ 10:30 am . He is aware and instructions are in the mail.

## 2016-03-04 NOTE — Progress Notes (Signed)
Lab orders mailed to the pt.  

## 2016-03-04 NOTE — Addendum Note (Signed)
Addended by: Mahala Menghini on: 03/04/2016 12:21 PM   Modules accepted: Orders

## 2016-03-09 ENCOUNTER — Ambulatory Visit: Payer: Medicare Other | Admitting: Family Medicine

## 2016-03-10 ENCOUNTER — Ambulatory Visit: Payer: Medicare Other | Admitting: Nurse Practitioner

## 2016-03-15 DIAGNOSIS — C61 Malignant neoplasm of prostate: Secondary | ICD-10-CM | POA: Diagnosis not present

## 2016-03-16 DIAGNOSIS — R1030 Lower abdominal pain, unspecified: Secondary | ICD-10-CM | POA: Diagnosis not present

## 2016-03-16 DIAGNOSIS — K625 Hemorrhage of anus and rectum: Secondary | ICD-10-CM | POA: Diagnosis not present

## 2016-03-16 DIAGNOSIS — K5900 Constipation, unspecified: Secondary | ICD-10-CM | POA: Diagnosis not present

## 2016-03-16 LAB — CBC WITH DIFFERENTIAL/PLATELET
Basophils Absolute: 74 cells/uL (ref 0–200)
Basophils Relative: 1 %
Eosinophils Absolute: 222 cells/uL (ref 15–500)
Eosinophils Relative: 3 %
HCT: 38.8 % (ref 38.5–50.0)
Hemoglobin: 12.7 g/dL — ABNORMAL LOW (ref 13.2–17.1)
Lymphocytes Relative: 30 %
Lymphs Abs: 2220 cells/uL (ref 850–3900)
MCH: 29.6 pg (ref 27.0–33.0)
MCHC: 32.7 g/dL (ref 32.0–36.0)
MCV: 90.4 fL (ref 80.0–100.0)
MPV: 10.2 fL (ref 7.5–12.5)
Monocytes Absolute: 444 cells/uL (ref 200–950)
Monocytes Relative: 6 %
Neutro Abs: 4440 cells/uL (ref 1500–7800)
Neutrophils Relative %: 60 %
Platelets: 260 10*3/uL (ref 140–400)
RBC: 4.29 MIL/uL (ref 4.20–5.80)
RDW: 13.9 % (ref 11.0–15.0)
WBC: 7.4 10*3/uL (ref 3.8–10.8)

## 2016-03-17 NOTE — Progress Notes (Signed)
Hgb slightly low but stable, Hct normal.  TCS as planned.  Repeat CBC in 58month.

## 2016-03-22 DIAGNOSIS — N401 Enlarged prostate with lower urinary tract symptoms: Secondary | ICD-10-CM | POA: Diagnosis not present

## 2016-03-22 DIAGNOSIS — C61 Malignant neoplasm of prostate: Secondary | ICD-10-CM | POA: Diagnosis not present

## 2016-03-23 ENCOUNTER — Telehealth: Payer: Self-pay | Admitting: Internal Medicine

## 2016-03-23 NOTE — Telephone Encounter (Signed)
Noted  

## 2016-03-23 NOTE — Telephone Encounter (Signed)
Pt called to cancel his colonoscopy for tomorrow with RMR. He said that he was sick (flu) and would call back next week to reschedule.

## 2016-03-24 ENCOUNTER — Ambulatory Visit (HOSPITAL_COMMUNITY): Admission: RE | Admit: 2016-03-24 | Payer: Medicare Other | Source: Ambulatory Visit | Admitting: Internal Medicine

## 2016-03-24 ENCOUNTER — Encounter (HOSPITAL_COMMUNITY): Admission: RE | Payer: Self-pay | Source: Ambulatory Visit

## 2016-03-24 SURGERY — COLONOSCOPY
Anesthesia: Moderate Sedation

## 2016-05-03 ENCOUNTER — Ambulatory Visit (INDEPENDENT_AMBULATORY_CARE_PROVIDER_SITE_OTHER): Payer: Medicare Other | Admitting: Family Medicine

## 2016-05-03 ENCOUNTER — Ambulatory Visit: Payer: Medicare Other | Admitting: Family Medicine

## 2016-05-03 ENCOUNTER — Encounter: Payer: Self-pay | Admitting: Family Medicine

## 2016-05-03 VITALS — BP 112/71 | HR 64 | Temp 98.4°F | Ht 67.5 in | Wt 155.0 lb

## 2016-05-03 DIAGNOSIS — M19011 Primary osteoarthritis, right shoulder: Secondary | ICD-10-CM

## 2016-05-03 DIAGNOSIS — M19012 Primary osteoarthritis, left shoulder: Secondary | ICD-10-CM | POA: Diagnosis not present

## 2016-05-03 DIAGNOSIS — M1812 Unilateral primary osteoarthritis of first carpometacarpal joint, left hand: Secondary | ICD-10-CM

## 2016-05-03 DIAGNOSIS — M160 Bilateral primary osteoarthritis of hip: Secondary | ICD-10-CM | POA: Diagnosis not present

## 2016-05-03 MED ORDER — MELOXICAM 15 MG PO TABS: 15.0000 mg | ORAL_TABLET | Freq: Every day | ORAL | 1 refills | Status: DC

## 2016-05-03 MED ORDER — DICLOFENAC SODIUM 1 % TD GEL: 2.0000 g | Freq: Four times a day (QID) | TRANSDERMAL | 2 refills | Status: DC

## 2016-05-03 NOTE — Patient Instructions (Signed)
Recommended for patient to use gel first and then meloxicam if needed.

## 2016-05-03 NOTE — Progress Notes (Signed)
BP 112/71   Pulse 64   Temp 98.4 F (36.9 C) (Oral)   Ht 5' 7.5" (1.715 m)   Wt 155 lb (70.3 kg)   BMI 23.92 kg/m    Subjective:    Patient ID: Keith Toniann Fail., male    DOB: 05-24-1940, 76 y.o.   MRN: [id number]  HPI: Keith Efton Thomley. is a 76 y.o. male presenting on 05/03/2016 for Pain in hips, shoulders and left wrist   HPI Arthritis in hips and shoulders and thumb Patient has been having arthritis in both hips and both shoulders and in his left thumb that have been going on for the past couple years. He says it has been bothering him more over the past few weeks and he is coming in to see if there is anything that he can get to help with this. He denies any fevers or chills or redness or warmth. He denies any loss of range of motion or numbness or weakness in either of his shoulders or hips or thumb. Thumb that hurts it is his left one.  Relevant past medical, surgical, family and social history reviewed and updated as indicated. Interim medical history since our last visit reviewed. Allergies and medications reviewed and updated.  Review of Systems  Constitutional: Negative for chills and fever.  Respiratory: Negative for shortness of breath and wheezing.   Cardiovascular: Negative for chest pain and leg swelling.  Musculoskeletal: Positive for arthralgias. Negative for back pain, gait problem and joint swelling.  Skin: Negative for rash.  All other systems reviewed and are negative.   Per HPI unless specifically indicated above     Objective:    BP 112/71   Pulse 64   Temp 98.4 F (36.9 C) (Oral)   Ht 5' 7.5" (1.715 m)   Wt 155 lb (70.3 kg)   BMI 23.92 kg/m   Wt Readings from Last 3 Encounters:  05/03/16 155 lb (70.3 kg)  02/25/16 160 lb 9.6 oz (72.8 kg)  01/27/16 157 lb (71.2 kg)    Physical Exam  Constitutional: He is oriented to person, place, and time. He appears well-developed and well-nourished. No distress.  Eyes: Conjunctivae are normal. No scleral  icterus.  Cardiovascular: Normal rate, regular rhythm, normal heart sounds and intact distal pulses.   No murmur heard. Pulmonary/Chest: Effort normal and breath sounds normal. No respiratory distress. He has no wheezes.  Musculoskeletal: Normal range of motion. He exhibits no edema.  Patient has pain in both anterior and lateral shoulders and both anterior lateral hips. Overlying his left carpometacarpal joint. He denies any fevers or chills or redness or warmth.  Neurological: He is alert and oriented to person, place, and time. Coordination normal.  Skin: Skin is warm and dry. No rash noted. He is not diaphoretic.  Psychiatric: He has a normal mood and affect. His behavior is normal.  Nursing note and vitals reviewed.     Assessment & Plan:   Problem List Items Addressed This Visit    None    Visit Diagnoses    Primary osteoarthritis of both shoulders    -  Primary   Relevant Medications   meloxicam (MOBIC) 15 MG tablet   diclofenac sodium (VOLTAREN) 1 % GEL   Primary osteoarthritis of both hips       Relevant Medications   meloxicam (MOBIC) 15 MG tablet   diclofenac sodium (VOLTAREN) 1 % GEL   Arthritis of carpometacarpal (CMC) joint of left thumb  Relevant Medications   meloxicam (MOBIC) 15 MG tablet   diclofenac sodium (VOLTAREN) 1 % GEL       Follow up plan: Return if symptoms worsen or fail to improve.  Counseling provided for all of the vaccine components No orders of the defined types were placed in this encounter.   Keith Pina, MD [facility] 05/03/2016, 12:35 PM

## 2016-05-03 NOTE — Addendum Note (Signed)
Addended by: Michaela Corner on: 05/03/2016 04:23 PM   Modules accepted: Orders

## 2016-05-10 ENCOUNTER — Ambulatory Visit: Payer: Medicare Other | Admitting: Family Medicine

## 2016-05-12 ENCOUNTER — Ambulatory Visit: Payer: Medicare Other | Admitting: Family Medicine

## 2016-06-03 ENCOUNTER — Encounter: Payer: Self-pay | Admitting: Nurse Practitioner

## 2016-06-03 ENCOUNTER — Ambulatory Visit (INDEPENDENT_AMBULATORY_CARE_PROVIDER_SITE_OTHER): Payer: Medicare Other | Admitting: Nurse Practitioner

## 2016-06-03 VITALS — BP 145/81 | HR 59 | Temp 97.2°F | Ht 67.0 in | Wt 164.0 lb

## 2016-06-03 DIAGNOSIS — J019 Acute sinusitis, unspecified: Secondary | ICD-10-CM | POA: Diagnosis not present

## 2016-06-03 DIAGNOSIS — G4440 Drug-induced headache, not elsewhere classified, not intractable: Secondary | ICD-10-CM

## 2016-06-03 DIAGNOSIS — J321 Chronic frontal sinusitis: Secondary | ICD-10-CM

## 2016-06-03 NOTE — Progress Notes (Signed)
   Subjective:    Patient ID: Keith Toniann Fail., male    DOB: May 23, 1940, 76 y.o.   MRN: [id number]  HPI Patient comes in c/o sinus congestion- no fever- has been using afrin nasal spray at leat 2x a day for 2 months- has had bloody nasal discharge- has also been having headache and has been taking 2-3 goody powders a day.    Review of Systems  Constitutional: Positive for chills and fever.  HENT: Positive for congestion and sinus pressure. Negative for sore throat and trouble swallowing.   Respiratory: Negative for cough.   Cardiovascular: Negative.   Genitourinary: Negative.   Neurological: Negative.   Psychiatric/Behavioral: Negative.   All other systems reviewed and are negative.      Objective:   Physical Exam  Constitutional: He appears well-developed.  HENT:  Right Ear: Hearing, tympanic membrane, external ear and ear canal normal.  Left Ear: Hearing, tympanic membrane, external ear and ear canal normal.  Nose: Mucosal edema and rhinorrhea present. Right sinus exhibits no maxillary sinus tenderness and no frontal sinus tenderness. Left sinus exhibits no maxillary sinus tenderness and no frontal sinus tenderness.  Mouth/Throat: Uvula is midline and mucous membranes are normal.  Neck: Normal range of motion. Neck supple.  Cardiovascular: Normal rate.   Pulmonary/Chest: Effort normal and breath sounds normal.  Skin: Skin is warm.  Psychiatric: He has a normal mood and affect. His behavior is normal. Judgment and thought content normal.   BP (!) 145/81   Pulse (!) 59   Temp 97.2 F (36.2 C) (Oral)   Ht '5\' 7"'$  (1.702 m)   Wt 164 lb (74.4 kg)   BMI 25.69 kg/m         Assessment & Plan:   1. Chronic frontal sinusitis   2. Acute rhinosinusitis   3.      Headache  STOP AFRIN STOP GOODY POWDERS Continue flonase May use otc decongestant Will take 2-3 weeks to improve  Mary-Margaret Hassell Done, FNP

## 2016-07-13 ENCOUNTER — Ambulatory Visit (INDEPENDENT_AMBULATORY_CARE_PROVIDER_SITE_OTHER): Payer: Medicare Other | Admitting: Nurse Practitioner

## 2016-07-13 ENCOUNTER — Ambulatory Visit (INDEPENDENT_AMBULATORY_CARE_PROVIDER_SITE_OTHER): Payer: Medicare Other

## 2016-07-13 ENCOUNTER — Encounter: Payer: Self-pay | Admitting: Nurse Practitioner

## 2016-07-13 VITALS — BP 130/79 | HR 56 | Temp 96.8°F | Ht 67.0 in | Wt 159.0 lb

## 2016-07-13 DIAGNOSIS — M1812 Unilateral primary osteoarthritis of first carpometacarpal joint, left hand: Secondary | ICD-10-CM | POA: Diagnosis not present

## 2016-07-13 DIAGNOSIS — R609 Edema, unspecified: Secondary | ICD-10-CM

## 2016-07-13 DIAGNOSIS — M79645 Pain in left finger(s): Secondary | ICD-10-CM | POA: Diagnosis not present

## 2016-07-13 IMAGING — DX DG HAND COMPLETE 3+V*L*
3 series · 3 of 3 positions shown · non-contrast
Comparison: [DATE]

CLINICAL DATA: Pain and swelling left arm radiating into left hand
worse over the past year.

EXAM:
LEFT HAND - COMPLETE 3+ VIEW

[hand pa]
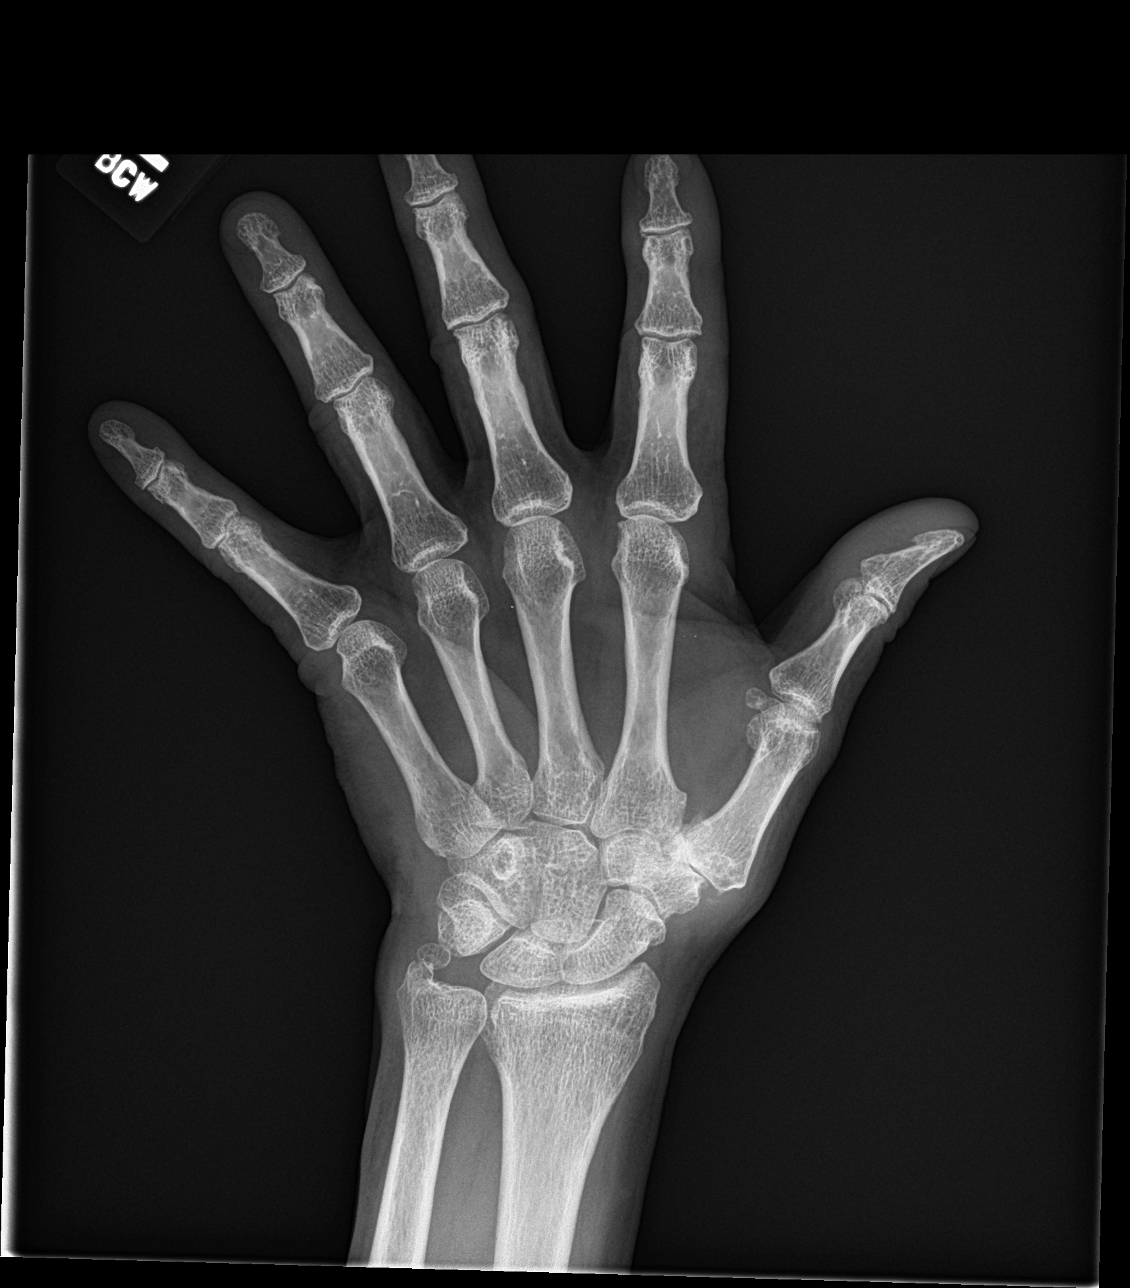

[hand lat]
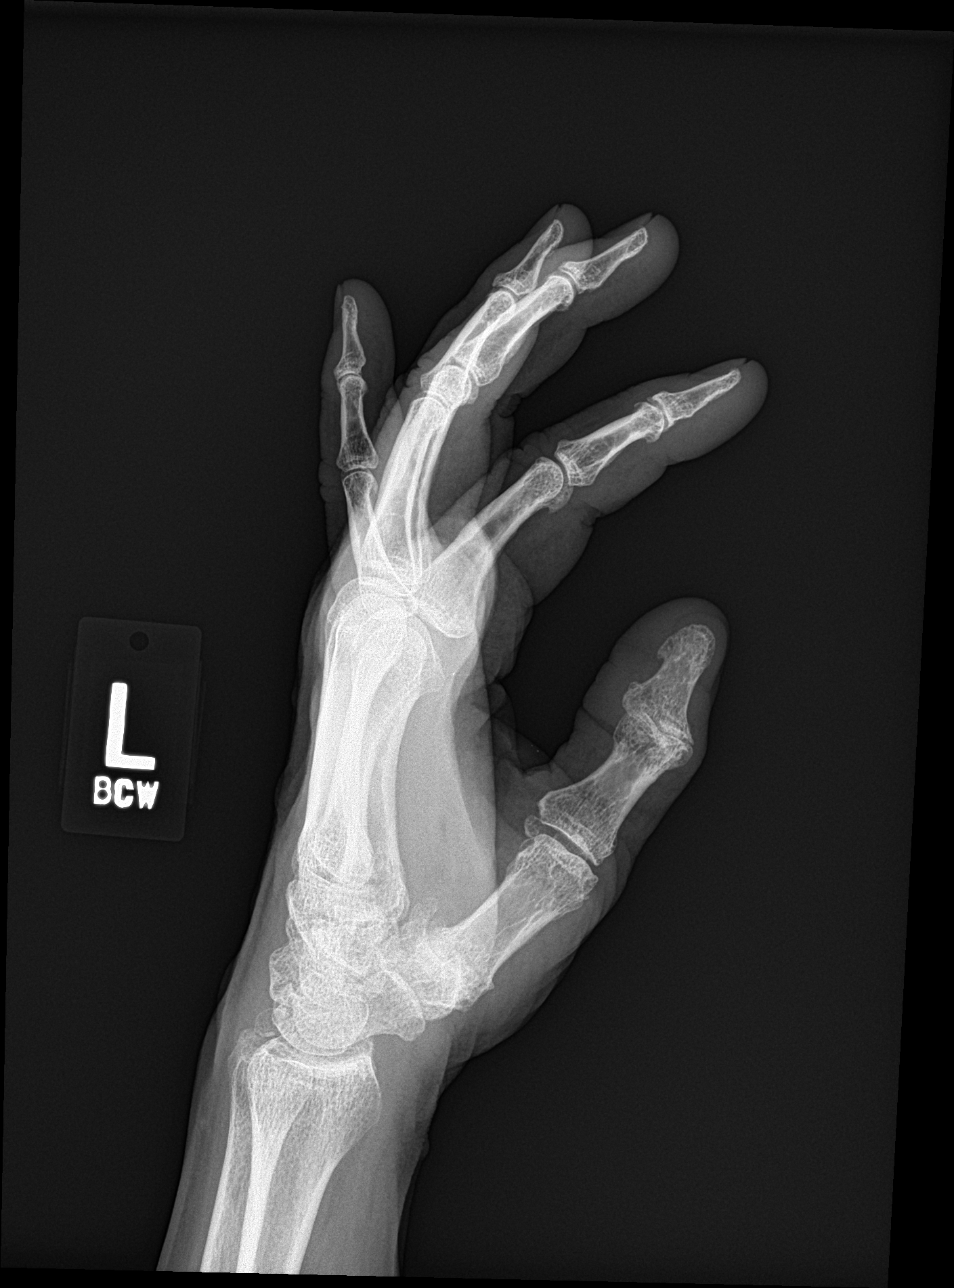

[hand obl]
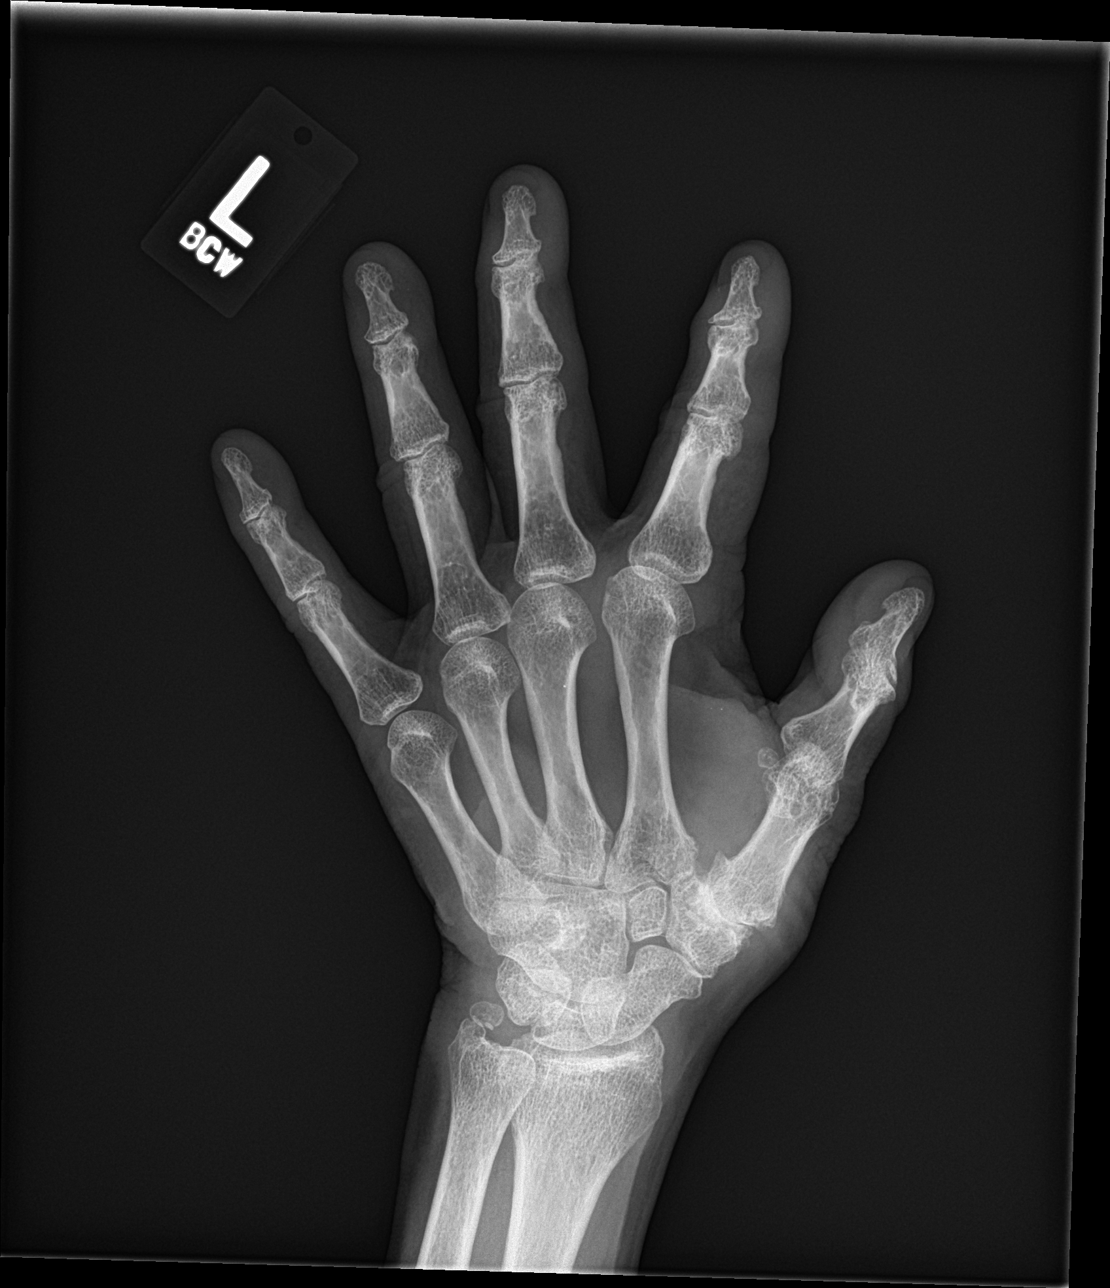

[3 of 3 positions shown; findings below may reference images not displayed]

FINDINGS: Mild degenerate change of the radiocarpal joint. Degenerative change
over the scaphoid trapezium joint and moderate degenerative change
of the first carpometacarpal joint. Mild degenerate change of the
interphalangeal joints. No acute fracture or dislocation. No
evidence of bony erosions. Bone mineralization and alignment is
within normal.
IMPRESSION: Degenerative changes over the hand and wrist with moderate
degenerative change of the first carpometacarpal joint.

No acute findings.

## 2016-07-13 MED ORDER — MELOXICAM 15 MG PO TABS: 15.0000 mg | ORAL_TABLET | Freq: Every day | ORAL | 1 refills | Status: DC

## 2016-07-13 NOTE — Progress Notes (Signed)
   Subjective:    Patient ID: Keith Toniann Fail., male    DOB: 05-24-1940, 76 y.o.   MRN: [id number]  HPI  Patient comes in today accompanied by his wife with c/o left thumb pain. Started over a year ago and has increasingly gotten worse. He denies any injury.   Review of Systems  Constitutional: Negative.   Respiratory: Negative.   Musculoskeletal: Positive for arthralgias.  Neurological: Negative.   Psychiatric/Behavioral: Negative.   All other systems reviewed and are negative.      Objective:   Physical Exam  Constitutional: He is oriented to person, place, and time. He appears well-developed and well-nourished. He appears distressed (mild).  Cardiovascular: Normal rate.   Pulmonary/Chest: Effort normal.  Musculoskeletal:  Base of left thumb swollen and sore with movement. opposing to fifth finger barely possible due to pain.   Neurological: He is alert and oriented to person, place, and time.   BP 130/79   Pulse (!) 56   Temp (!) 96.8 F (36 C) (Oral)   Ht '5\' 7"'$  (1.702 m)   Wt 159 lb (72.1 kg)   BMI 24.90 kg/m   Left thumb xray- osteoarthritis of mip joint of left thumb- Preliminary reading by Keith Collum, FNP  [facility]       Assessment & Plan:   1. Pain of left thumb   2. Arthritis of carpometacarpal (CMC) joint of left thumb    Thumb spica splint Continue mobic as rx Referral to ortho- they will call with appointment RTO prn  Mary-Margaret Hassell Done, FNP

## 2016-07-15 DIAGNOSIS — C61 Malignant neoplasm of prostate: Secondary | ICD-10-CM | POA: Diagnosis not present

## 2016-07-21 DIAGNOSIS — C61 Malignant neoplasm of prostate: Secondary | ICD-10-CM | POA: Diagnosis not present

## 2016-07-21 DIAGNOSIS — R102 Pelvic and perineal pain: Secondary | ICD-10-CM | POA: Diagnosis not present

## 2016-07-22 ENCOUNTER — Telehealth: Payer: Self-pay | Admitting: Family Medicine

## 2016-07-22 NOTE — Telephone Encounter (Signed)
What type of referral do you need? To pain center per dr Risa Grill, cancer doctor  Have you been seen at our office for this problem? yes (If no, schedule them an appointment.  They will need to be seen before a referral can be done.)  Is there a particular doctor or location that you prefer? no  Patient notified that referrals can take up to a week or longer to process. If they haven't heard anything within a week they should call back and speak with the referral department.

## 2016-07-22 NOTE — Telephone Encounter (Signed)
lmtcb to see if his cancer doctor had a suggestion of a specific pain management provider in mind

## 2016-07-28 NOTE — Telephone Encounter (Signed)
lmtcb

## 2016-08-02 ENCOUNTER — Telehealth: Payer: Self-pay | Admitting: Family Medicine

## 2016-08-04 NOTE — Telephone Encounter (Signed)
Scheduled

## 2016-08-05 ENCOUNTER — Ambulatory Visit: Payer: Medicare Other

## 2016-08-06 NOTE — Telephone Encounter (Signed)
lmtcb jkp 6/8

## 2016-08-18 NOTE — Telephone Encounter (Signed)
At this time pt will wait for recent results from [place] Discussed 2 pain clinics in [place] [facility] [phone] [place]

## 2016-08-27 ENCOUNTER — Encounter: Payer: Self-pay | Admitting: Cardiology

## 2016-08-27 NOTE — Progress Notes (Signed)
Cardiology Office Note  Date: 08/30/2016   ID: Keith Toniann Fail., DOB 02/10/1941, MRN [id number]  PCP: Dettinger, Fransisca Kaufmann, MD  Primary Cardiologist: Rozann Lesches, MD   Chief Complaint  Patient presents with  . Coronary Artery Disease    History of Present Illness: Keith Xavior Niazi. is a 76 y.o. male last seen in November 2017. He presents today with his wife for a follow-up visit. He tells me that a few weeks ago he experienced an episode of chest pain at rest that lasted for several minutes, he did not use nitroglycerin. Since that time he has been experiencing exertional dyspnea and chest discomfort consistent with previous angina. He states that the symptoms have been increasing in frequency.  Unrelated to the above, he also indicates ongoing evaluation through the [facility] system for occult blood in the stool. He is scheduled to have a colonoscopy done on the 31st of this month. He is on aspirin and Plavix and has not discontinued either medication as yet.  I personally reviewed his ECG today which shows normal sinus rhythm.  Past Medical History:  Diagnosis Date  . Agent orange exposure   . Arthritis   . Coronary artery disease    BMS to mid LAD 2001, DES to proximal LAD 2017  . Enlarged prostate    XRT 2016  . Headache    Sinus headaches   . History of depression   . Hyperlipidemia   . Prostate cancer ([place]) 07/2014   hormonal therapy, external beam radiation therapy  . PTSD (post-traumatic stress disorder)     Past Surgical History:  Procedure Laterality Date  . CARDIAC CATHETERIZATION N/A 12/12/2015   Procedure: Left Heart Cath and Coronary Angiography;  Surgeon: Peter M Martinique, MD;  Location: [place] [facility];  Service: Cardiovascular;  Laterality: N/A;  . CARDIAC CATHETERIZATION N/A 12/12/2015   Procedure: Coronary Stent Intervention;  Surgeon: Peter M Martinique, MD;  Location: [place] [facility];  Service: Cardiovascular;  Laterality: N/A;  . HERNIA REPAIR  Right   . PROSTATE BIOPSY N/A 08/28/2014   Procedure: BIOPSY TRANSRECTAL ULTRASONIC PROSTATE (TUBP);  Surgeon: Rana Snare, MD;  [facility];  Service: Urology;  Laterality: N/A;  . TRANSURETHRAL RESECTION OF PROSTATE N/A 08/28/2014   Procedure: TRANSURETHRAL RESECTION OF THE PROSTATE WITH GYRUS INSTRUMENTS;  Surgeon: Rana Snare, MD;  [facility];  Service: Urology;  Laterality: N/A;    Current Outpatient Prescriptions  Medication Sig Dispense Refill  . aspirin EC 81 MG tablet Take 1 tablet (81 mg total) by mouth daily.    . clopidogrel (PLAVIX) 75 MG tablet Take 1 tablet (75 mg total) by mouth daily. 90 tablet 3  . fluticasone (FLONASE) 50 MCG/ACT nasal spray Place 1 spray into both nostrils 2 (two) times daily as needed for allergies or rhinitis. 16 g 6  . nitroGLYCERIN (NITROSTAT) 0.4 MG SL tablet Place 0.4 mg under the tongue every 5 (five) minutes as needed for chest pain. X 3 doses    . rosuvastatin (CRESTOR) 20 MG tablet Take 1 tablet (20 mg total) by mouth daily. (Patient taking differently: Take 20 mg by mouth every evening. ) 30 tablet 11   No current facility-administered medications for this visit.    Allergies:  Lipitor [atorvastatin]   Social History: The patient  reports that he has never smoked. He has quit using smokeless tobacco. His smokeless tobacco use included Chew. He reports that he does not drink alcohol or use drugs.  Family History: The patient's family history includes Arthritis in his mother.   ROS:  Please see the history of present illness. Otherwise, complete review of systems is positive for intermittent hematochezia.  All other systems are reviewed and negative.   Physical Exam: VS:  BP (!) 148/76   Pulse 60   Ht 5\' 8"  (1.727 m)   Wt 163 lb (73.9 kg)   SpO2 98%   BMI 24.78 kg/m , BMI Body mass index is 24.78 kg/m.  Wt Readings from Last 3 Encounters:  08/30/16 163 lb (73.9 kg)  07/13/16 159 lb (72.1 kg)  06/03/16 164 lb (74.4 kg)      General: Patient appears comfortable at rest. HEENT: Conjunctiva and lids normal, oropharynx clear. Neck: Supple, no elevated JVP or carotid bruits, no thyromegaly. Lungs: Clear to auscultation, nonlabored breathing at rest. Cardiac: Regular rate and rhythm, no S3 or significant systolic murmur, no pericardial rub. Abdomen: Soft, nontender, bowel sounds present. Extremities: No pitting edema, distal pulses 2+. Skin: Warm and dry. Musculoskeletal: No kyphosis. Neuropsychiatric: Alert and oriented x3, affect grossly appropriate.  ECG: I personally reviewed the tracing from 12/12/2015 which showed sinus bradycardia with leftward axis.  Recent Labwork: 12/12/2015: BUN 9; Creatinine, Ser 0.74; Potassium 3.9; Sodium 139 03/16/2016: Hemoglobin 12.7; Platelets 260   Other Studies Reviewed Today:  Cardiac catheterization 12/12/2015:  Prox LAD lesion, 80 %stenosed.  RPDA lesion, 50 %stenosed.  The left ventricular systolic function is normal.  LV end diastolic pressure is normal.  The left ventricular ejection fraction is 55-65% by visual estimate.  A STENT PROMUS PREM MR 3.0X12 drug eluting stent was successfully placed, and overlaps previously placed stent.  Prox LAD to Mid LAD lesion, 85 %stenosed.  Post intervention, there is a 0% residual stenosis.  1. Single vessel obstructive CAD. There is a stenosis at the proximal margin of the old stent.  2. Normal LV function and normal LV EDP 3. Successful stenting of the proximal LAD with DES- overlapping his old stent  Assessment and Plan:  1. Accelerating angina symptoms over the last few weeks as discussed above. Patient reports compliance with his medications and has continued on DAPT. Last intervention was DES overlapping prior stent site within the LAD as of October 2017. ECG is normal today at rest. We have discussed options and plan is to pursue a follow-up diagnostic cardiac catheterization this week.  2. History of occult  blood in the stool, reportedly ongoing workup through the [facility] medical system with plan for colonoscopy later this month. Cardiac catheterization results will need to be considered, and if a repeat PCI is necessary, he will not be able to stop dual antiplatelet therapy (therefore intervention with colonoscopy not possible at least within the short-term).  3. Hyperlipidemia on Crestor.  Current medicines were reviewed with the patient today.   Orders Placed This Encounter  Procedures  . EKG 12-Lead    Disposition: Follow-up after cardiac catheterization.  Signed, Satira Sark, MD, [facility] 08/30/2016 4:15 PM    [facility] [place], [place], [place] [address] Phone: [phone]; Fax: [phone]

## 2016-08-30 ENCOUNTER — Encounter: Payer: Self-pay | Admitting: Cardiology

## 2016-08-30 ENCOUNTER — Other Ambulatory Visit: Payer: Self-pay | Admitting: Cardiology

## 2016-08-30 ENCOUNTER — Ambulatory Visit (INDEPENDENT_AMBULATORY_CARE_PROVIDER_SITE_OTHER): Payer: Medicare Other | Admitting: Cardiology

## 2016-08-30 ENCOUNTER — Telehealth: Payer: Self-pay | Admitting: Cardiology

## 2016-08-30 VITALS — BP 148/76 | HR 60 | Ht 68.0 in | Wt 163.0 lb

## 2016-08-30 DIAGNOSIS — K921 Melena: Secondary | ICD-10-CM | POA: Diagnosis not present

## 2016-08-30 DIAGNOSIS — I200 Unstable angina: Secondary | ICD-10-CM

## 2016-08-30 DIAGNOSIS — E782 Mixed hyperlipidemia: Secondary | ICD-10-CM

## 2016-08-30 DIAGNOSIS — I25119 Atherosclerotic heart disease of native coronary artery with unspecified angina pectoris: Secondary | ICD-10-CM

## 2016-08-30 NOTE — Telephone Encounter (Signed)
L heart cath- accelerated angina September 02, 2016

## 2016-08-30 NOTE — Patient Instructions (Signed)
Medication Instructions:  Your physician recommends that you continue on your current medications as directed. Please refer to the Current Medication list given to you today.  Labwork: NONE  Testing/Procedures:  You are scheduled for a Cardiac Catheterization on Thursday, July 5 with Dr. Glenetta Hew.  1. Please arrive at the [facility] (Main Entrance A) at [facility]: [place], [place] [address] at 8:30 AM (two hours before your procedure to ensure your preparation). Free valet parking service is available.   Special note: Every effort is made to have your procedure done on time. Please understand that emergencies sometimes delay scheduled procedures.  2. Diet: Do not eat or drink anything after midnight prior to your procedure except sips of water to take medications.  3. Labs: Your labs will be performed at the hospital after you arrive for your procedure.  4. Medication instructions in preparation for your procedure: Please continue all medications as prescribed  On the morning of your procedure, take your Aspirin and any morning medicines NOT listed above.  You may use sips of water.  5. Plan for one night stay--bring personal belongings. 6. Bring a current list of your medications and current insurance cards. 7. You MUST have a responsible person to drive you home. 8. Someone MUST be with you the first 24 hours after you arrive home or your discharge will be delayed. 9. Please wear clothes that are easy to get on and off and wear slip-on shoes.  Thank you for allowing [place] to care for you!   -- [facility]  Follow-Up: Your physician recommends that you schedule a follow-up appointment in: [place]  Any Other Special Instructions Will Be Listed Below (If Applicable).  If you need a refill on your cardiac medications before your next appointment, please call your pharmacy.

## 2016-08-31 ENCOUNTER — Telehealth: Payer: Self-pay

## 2016-08-31 ENCOUNTER — Ambulatory Visit: Payer: Medicare Other | Admitting: Cardiology

## 2016-08-31 NOTE — Telephone Encounter (Signed)
Left detailed message on Pt VM per DPR on file.  Patient contacted pre-catheterization at [facility] scheduled for: 09/02/2016 @ 1030  Verified arrival time and place:  Notified to arrive @ NT at 0800  Confirmed AM meds to be taken pre-cath with sip of water:  Notified to take Plavix and ASA prior to arrival.  Confirmed patient has responsible person to drive home post procedure and observe patient for 24 hours:  Notified Pt will have to have someone to drive him home and he will need to verbalize after procedure someone will be with him overnight.  Left this nurse name and # for call back/questions.

## 2016-09-02 ENCOUNTER — Encounter (HOSPITAL_COMMUNITY)
Admission: RE | Disposition: A | Discharge status: Home / Self Care | Payer: Self-pay | Source: Ambulatory Visit | Attending: Cardiology

## 2016-09-02 ENCOUNTER — Ambulatory Visit (HOSPITAL_COMMUNITY)
Admission: RE | Admit: 2016-09-02 | Discharge: 2016-09-02 | Disposition: A | Discharge status: Home / Self Care | Payer: Medicare Other | Source: Ambulatory Visit | Attending: Cardiology | Admitting: Cardiology

## 2016-09-02 ENCOUNTER — Encounter (HOSPITAL_COMMUNITY): Payer: Self-pay | Admitting: Cardiology

## 2016-09-02 DIAGNOSIS — N400 Enlarged prostate without lower urinary tract symptoms: Secondary | ICD-10-CM | POA: Diagnosis not present

## 2016-09-02 DIAGNOSIS — F329 Major depressive disorder, single episode, unspecified: Secondary | ICD-10-CM | POA: Insufficient documentation

## 2016-09-02 DIAGNOSIS — Y831 Surgical operation with implant of artificial internal device as the cause of abnormal reaction of the patient, or of later complication, without mention of misadventure at the time of the procedure: Secondary | ICD-10-CM | POA: Insufficient documentation

## 2016-09-02 DIAGNOSIS — Z574 Occupational exposure to toxic agents in agriculture: Secondary | ICD-10-CM | POA: Insufficient documentation

## 2016-09-02 DIAGNOSIS — I200 Unstable angina: Secondary | ICD-10-CM

## 2016-09-02 DIAGNOSIS — Z8546 Personal history of malignant neoplasm of prostate: Secondary | ICD-10-CM | POA: Diagnosis not present

## 2016-09-02 DIAGNOSIS — I10 Essential (primary) hypertension: Secondary | ICD-10-CM | POA: Diagnosis not present

## 2016-09-02 DIAGNOSIS — I2510 Atherosclerotic heart disease of native coronary artery without angina pectoris: Secondary | ICD-10-CM | POA: Diagnosis present

## 2016-09-02 DIAGNOSIS — F4310 Post-traumatic stress disorder, unspecified: Secondary | ICD-10-CM | POA: Diagnosis not present

## 2016-09-02 DIAGNOSIS — I25110 Atherosclerotic heart disease of native coronary artery with unstable angina pectoris: Secondary | ICD-10-CM

## 2016-09-02 DIAGNOSIS — R195 Other fecal abnormalities: Secondary | ICD-10-CM | POA: Diagnosis not present

## 2016-09-02 DIAGNOSIS — Z7902 Long term (current) use of antithrombotics/antiplatelets: Secondary | ICD-10-CM | POA: Insufficient documentation

## 2016-09-02 DIAGNOSIS — M1990 Unspecified osteoarthritis, unspecified site: Secondary | ICD-10-CM | POA: Diagnosis not present

## 2016-09-02 DIAGNOSIS — E785 Hyperlipidemia, unspecified: Secondary | ICD-10-CM | POA: Insufficient documentation

## 2016-09-02 DIAGNOSIS — Z7982 Long term (current) use of aspirin: Secondary | ICD-10-CM | POA: Insufficient documentation

## 2016-09-02 DIAGNOSIS — T82855A Stenosis of coronary artery stent, initial encounter: Secondary | ICD-10-CM | POA: Diagnosis not present

## 2016-09-02 DIAGNOSIS — R079 Chest pain, unspecified: Secondary | ICD-10-CM | POA: Diagnosis present

## 2016-09-02 HISTORY — PX: LEFT HEART CATH AND CORONARY ANGIOGRAPHY: CATH118249

## 2016-09-02 LAB — BASIC METABOLIC PANEL
Anion gap: 9 (ref 5–15)
BUN: 10 mg/dL (ref 6–20)
CO2: 24 mmol/L (ref 22–32)
Calcium: 9.4 mg/dL (ref 8.9–10.3)
Chloride: 106 mmol/L (ref 101–111)
Creatinine, Ser: 0.68 mg/dL (ref 0.61–1.24)
GFR calc Af Amer: >60 mL/min (ref >60)
GFR calc non Af Amer: >60 mL/min (ref >60)
Glucose, Bld: 99 mg/dL (ref 65–99)
Potassium: 4.2 mmol/L (ref 3.5–5.1)
Sodium: 139 mmol/L (ref 135–145)

## 2016-09-02 LAB — CBC
HCT: 40.4 % (ref 39.0–52.0)
Hemoglobin: 13.4 g/dL (ref 13.0–17.0)
MCH: 30 pg (ref 26.0–34.0)
MCHC: 33.2 g/dL (ref 30.0–36.0)
MCV: 90.6 fL (ref 78.0–100.0)
Platelets: 239 10*3/uL (ref 150–400)
RBC: 4.46 MIL/uL (ref 4.22–5.81)
RDW: 13.1 % (ref 11.5–15.5)
WBC: 5.8 10*3/uL (ref 4.0–10.5)

## 2016-09-02 LAB — PROTIME-INR
INR: 0.99
Prothrombin Time: 13.1 seconds (ref 11.4–15.2)

## 2016-09-02 SURGERY — LEFT HEART CATH AND CORONARY ANGIOGRAPHY
Anesthesia: LOCAL

## 2016-09-02 MED ORDER — MIDAZOLAM HCL 2 MG/2ML IJ SOLN
INTRAMUSCULAR | Status: DC | PRN
  Administered 2016-09-02: 1 mg via INTRAVENOUS
  Administered 2016-09-02: 2 mg via INTRAVENOUS

## 2016-09-02 MED ORDER — VERAPAMIL HCL 2.5 MG/ML IV SOLN
INTRAVENOUS | Status: AC
  Filled 2016-09-02: qty 2

## 2016-09-02 MED ORDER — LIDOCAINE HCL (PF) 1 % IJ SOLN
INTRAMUSCULAR | Status: DC | PRN
  Administered 2016-09-02: 2 mL

## 2016-09-02 MED ORDER — HEPARIN (PORCINE) IN NACL 2-0.9 UNIT/ML-% IJ SOLN
INTRAMUSCULAR | Status: AC
  Filled 2016-09-02: qty 500

## 2016-09-02 MED ORDER — SODIUM CHLORIDE 0.9 % IV SOLN: INTRAVENOUS | Status: DC

## 2016-09-02 MED ORDER — SODIUM CHLORIDE 0.9 % IV SOLN
INTRAVENOUS | Status: DC
  Administered 2016-09-02: 09:00:00 via INTRAVENOUS

## 2016-09-02 MED ORDER — CLOPIDOGREL BISULFATE 75 MG PO TABS
ORAL_TABLET | ORAL | Status: AC
  Administered 2016-09-02: 75 mg via ORAL
  Filled 2016-09-02: qty 1

## 2016-09-02 MED ORDER — FENTANYL CITRATE (PF) 100 MCG/2ML IJ SOLN
INTRAMUSCULAR | Status: AC
  Filled 2016-09-02: qty 2

## 2016-09-02 MED ORDER — CLOPIDOGREL BISULFATE 75 MG PO TABS
75.0000 mg | ORAL_TABLET | Freq: Once | ORAL | Status: AC
  Administered 2016-09-02: 75 mg via ORAL

## 2016-09-02 MED ORDER — SODIUM CHLORIDE 0.9% FLUSH: 3.0000 mL | Freq: Two times a day (BID) | INTRAVENOUS | Status: DC

## 2016-09-02 MED ORDER — SODIUM CHLORIDE 0.9 % IV SOLN: 250.0000 mL | INTRAVENOUS | Status: DC | PRN

## 2016-09-02 MED ORDER — HYDRALAZINE HCL 20 MG/ML IJ SOLN
INTRAMUSCULAR | Status: AC
  Filled 2016-09-02: qty 1

## 2016-09-02 MED ORDER — ACETAMINOPHEN 325 MG PO TABS: 650.0000 mg | ORAL_TABLET | ORAL | Status: DC | PRN

## 2016-09-02 MED ORDER — IOPAMIDOL (ISOVUE-370) INJECTION 76%
INTRAVENOUS | Status: AC
  Filled 2016-09-02: qty 100

## 2016-09-02 MED ORDER — HEPARIN SODIUM (PORCINE) 1000 UNIT/ML IJ SOLN
INTRAMUSCULAR | Status: AC
  Filled 2016-09-02: qty 1

## 2016-09-02 MED ORDER — HEPARIN (PORCINE) IN NACL 2-0.9 UNIT/ML-% IJ SOLN
INTRAMUSCULAR | Status: AC | PRN
  Administered 2016-09-02: 1000 mL via INTRA_ARTERIAL

## 2016-09-02 MED ORDER — HEPARIN SODIUM (PORCINE) 1000 UNIT/ML IJ SOLN
INTRAMUSCULAR | Status: DC | PRN
  Administered 2016-09-02: 4000 [IU] via INTRAVENOUS

## 2016-09-02 MED ORDER — SODIUM CHLORIDE 0.9% FLUSH: 3.0000 mL | INTRAVENOUS | Status: DC | PRN

## 2016-09-02 MED ORDER — VERAPAMIL HCL 2.5 MG/ML IV SOLN
INTRAVENOUS | Status: DC | PRN
  Administered 2016-09-02: 10 mL via INTRA_ARTERIAL

## 2016-09-02 MED ORDER — HYDRALAZINE HCL 20 MG/ML IJ SOLN
INTRAMUSCULAR | Status: DC | PRN
  Administered 2016-09-02: 20 mg via INTRAVENOUS

## 2016-09-02 MED ORDER — IOPAMIDOL (ISOVUE-370) INJECTION 76%
INTRAVENOUS | Status: DC | PRN
  Administered 2016-09-02: 70 mL via INTRA_ARTERIAL

## 2016-09-02 MED ORDER — FENTANYL CITRATE (PF) 100 MCG/2ML IJ SOLN
INTRAMUSCULAR | Status: DC | PRN
  Administered 2016-09-02 (×2): 25 ug via INTRAVENOUS

## 2016-09-02 MED ORDER — MIDAZOLAM HCL 2 MG/2ML IJ SOLN
INTRAMUSCULAR | Status: AC
  Filled 2016-09-02: qty 2

## 2016-09-02 MED ORDER — ONDANSETRON HCL 4 MG/2ML IJ SOLN: 4.0000 mg | Freq: Four times a day (QID) | INTRAMUSCULAR | Status: DC | PRN

## 2016-09-02 MED ORDER — ASPIRIN 81 MG PO CHEW: 81.0000 mg | CHEWABLE_TABLET | ORAL | Status: DC

## 2016-09-02 SURGICAL SUPPLY — 13 items
CATH INFINITI 5FR ANG PIGTAIL (CATHETERS) ×1 IMPLANT
CATH OPTITORQUE TIG 4.0 5F (CATHETERS) ×1 IMPLANT
DEVICE EN SNARE MINI 4-8MM (VASCULAR PRODUCTS) IMPLANT
DEVICE RAD COMP TR BAND LRG (VASCULAR PRODUCTS) ×1 IMPLANT
GLIDESHEATH SLEND A-KIT 6F 22G (SHEATH) ×1 IMPLANT
GUIDEWIRE INQWIRE 1.5J.035X260 (WIRE) IMPLANT
INQWIRE 1.5J .035X260CM (WIRE) ×2
KIT HEART LEFT (KITS) ×2 IMPLANT
PACK CARDIAC CATHETERIZATION (CUSTOM PROCEDURE TRAY) ×2 IMPLANT
SYR MEDRAD MARK V 150ML (SYRINGE) ×2 IMPLANT
TRANSDUCER W/STOPCOCK (MISCELLANEOUS) ×2 IMPLANT
TUBING CIL FLEX 10 FLL-RA (TUBING) ×2 IMPLANT
WIRE MICROINTRODUCER 60CM (WIRE) ×1 IMPLANT

## 2016-09-02 NOTE — Interval H&P Note (Signed)
History and Physical Interval Note:  09/02/2016 11:21 AM  Tayvon Toniann Fail.  has presented today for surgery, with the diagnosis of excellerating angina. Known CAD-PCI (LAD PCI with residual rPDA 80%)    The various methods of treatment have been discussed with the patient and family. After consideration of risks, benefits and other options for treatment, the patient has consented to  Procedure(s): Left Heart Cath and Coronary Angiography (N/A) as a surgical intervention .  The patient's history has been reviewed, patient examined, no change in status, stable for surgery.  I have reviewed the patient's chart and labs.  Questions were answered to the patient's satisfaction.    Cath Lab Visit (complete for each Cath Lab visit)  Clinical Evaluation Leading to the Procedure:   ACS: No.  Non-ACS:    Anginal Classification: CCS IV  Anti-ischemic medical therapy: No Therapy  Non-Invasive Test Results: No non-invasive testing performed  Prior CABG: No previous CABG   Keith Olson

## 2016-09-02 NOTE — Discharge Instructions (Signed)

## 2016-09-02 NOTE — H&P (View-Only) (Signed)
Cardiology Office Note  Date: 08/30/2016   ID: Keith Toniann Fail., DOB February 09, 1941, MRN [id number]  PCP: Dettinger, Keith Kaufmann, MD  Primary Cardiologist: Rozann Lesches, MD   Chief Complaint  Patient presents with  . Coronary Artery Disease    History of Present Illness: Keith Olson. is a 76 y.o. male last seen in November 2017. He presents today with his wife for a follow-up visit. He tells me that a few weeks ago he experienced an episode of chest pain at rest that lasted for several minutes, he did not use nitroglycerin. Since that time he has been experiencing exertional dyspnea and chest discomfort consistent with previous angina. He states that the symptoms have been increasing in frequency.  Unrelated to the above, he also indicates ongoing evaluation through the [facility] system for occult blood in the stool. He is scheduled to have a colonoscopy done on the 31st of this month. He is on aspirin and Plavix and has not discontinued either medication as yet.  I personally reviewed his ECG today which shows normal sinus rhythm.  Past Medical History:  Diagnosis Date  . Agent orange exposure   . Arthritis   . Coronary artery disease    BMS to mid LAD 2001, DES to proximal LAD 2017  . Enlarged prostate    XRT 2016  . Headache    Sinus headaches   . History of depression   . Hyperlipidemia   . Prostate cancer ([place]) 07/2014   hormonal therapy, external beam radiation therapy  . PTSD (post-traumatic stress disorder)     Past Surgical History:  Procedure Laterality Date  . CARDIAC CATHETERIZATION N/A 12/12/2015   Procedure: Left Heart Cath and Coronary Angiography;  Surgeon: Peter M Martinique, MD;  Location: [place] [facility];  Service: Cardiovascular;  Laterality: N/A;  . CARDIAC CATHETERIZATION N/A 12/12/2015   Procedure: Coronary Stent Intervention;  Surgeon: Peter M Martinique, MD;  Location: [place] [facility];  Service: Cardiovascular;  Laterality: N/A;  . HERNIA REPAIR  Right   . PROSTATE BIOPSY N/A 08/28/2014   Procedure: BIOPSY TRANSRECTAL ULTRASONIC PROSTATE (TUBP);  Surgeon: Rana Snare, MD;  Location: [facility];  Service: Urology;  Laterality: N/A;  . TRANSURETHRAL RESECTION OF PROSTATE N/A 08/28/2014   Procedure: TRANSURETHRAL RESECTION OF THE PROSTATE WITH GYRUS INSTRUMENTS;  Surgeon: Rana Snare, MD;  Location: [facility];  Service: Urology;  Laterality: N/A;    Current Outpatient Prescriptions  Medication Sig Dispense Refill  . aspirin EC 81 MG tablet Take 1 tablet (81 mg total) by mouth daily.    . clopidogrel (PLAVIX) 75 MG tablet Take 1 tablet (75 mg total) by mouth daily. 90 tablet 3  . fluticasone (FLONASE) 50 MCG/ACT nasal spray Place 1 spray into both nostrils 2 (two) times daily as needed for allergies or rhinitis. 16 g 6  . nitroGLYCERIN (NITROSTAT) 0.4 MG SL tablet Place 0.4 mg under the tongue every 5 (five) minutes as needed for chest pain. X 3 doses    . rosuvastatin (CRESTOR) 20 MG tablet Take 1 tablet (20 mg total) by mouth daily. (Patient taking differently: Take 20 mg by mouth every evening. ) 30 tablet 11   No current facility-administered medications for this visit.    Allergies:  Lipitor [atorvastatin]   Social History: The patient  reports that he has never smoked. He has quit using smokeless tobacco. His smokeless tobacco use included Chew. He reports that he does not drink alcohol or use drugs.  Family History: The patient's family history includes Arthritis in his mother.   ROS:  Please see the history of present illness. Otherwise, complete review of systems is positive for intermittent hematochezia.  All other systems are reviewed and negative.   Physical Exam: VS:  BP (!) 148/76   Pulse 60   Ht 5\' 8"  (1.727 m)   Wt 163 lb (73.9 kg)   SpO2 98%   BMI 24.78 kg/m , BMI Body mass index is 24.78 kg/m.  Wt Readings from Last 3 Encounters:  08/30/16 163 lb (73.9 kg)  07/13/16 159 lb (72.1 kg)  06/03/16 164 lb (74.4 kg)      General: Patient appears comfortable at rest. HEENT: Conjunctiva and lids normal, oropharynx clear. Neck: Supple, no elevated JVP or carotid bruits, no thyromegaly. Lungs: Clear to auscultation, nonlabored breathing at rest. Cardiac: Regular rate and rhythm, no S3 or significant systolic murmur, no pericardial rub. Abdomen: Soft, nontender, bowel sounds present. Extremities: No pitting edema, distal pulses 2+. Skin: Warm and dry. Musculoskeletal: No kyphosis. Neuropsychiatric: Alert and oriented x3, affect grossly appropriate.  ECG: I personally reviewed the tracing from 12/12/2015 which showed sinus bradycardia with leftward axis.  Recent Labwork: 12/12/2015: BUN 9; Creatinine, Ser 0.74; Potassium 3.9; Sodium 139 03/16/2016: Hemoglobin 12.7; Platelets 260   Other Studies Reviewed Today:  Cardiac catheterization 12/12/2015:  Prox LAD lesion, 80 %stenosed.  RPDA lesion, 50 %stenosed.  The left ventricular systolic function is normal.  LV end diastolic pressure is normal.  The left ventricular ejection fraction is 55-65% by visual estimate.  A STENT PROMUS PREM MR 3.0X12 drug eluting stent was successfully placed, and overlaps previously placed stent.  Prox LAD to Mid LAD lesion, 85 %stenosed.  Post intervention, there is a 0% residual stenosis.  1. Single vessel obstructive CAD. There is a stenosis at the proximal margin of the old stent.  2. Normal LV function and normal LV EDP 3. Successful stenting of the proximal LAD with DES- overlapping his old stent  Assessment and Plan:  1. Accelerating angina symptoms over the last few weeks as discussed above. Patient reports compliance with his medications and has continued on DAPT. Last intervention was DES overlapping prior stent site within the LAD as of October 2017. ECG is normal today at rest. We have discussed options and plan is to pursue a follow-up diagnostic cardiac catheterization this week.  2. History of occult  blood in the stool, reportedly ongoing workup through the [facility] medical system with plan for colonoscopy later this month. Cardiac catheterization results will need to be considered, and if a repeat PCI is necessary, he will not be able to stop dual antiplatelet therapy (therefore intervention with colonoscopy not possible at least within the short-term).  3. Hyperlipidemia on Crestor.  Current medicines were reviewed with the patient today.   Orders Placed This Encounter  Procedures  . EKG 12-Lead    Disposition: Follow-up after cardiac catheterization.  Signed, Satira Sark, MD, [facility] 08/30/2016 4:15 PM    [place] [facility] [place], [place], [place] [address] Phone: [phone]; Fax: [phone]

## 2016-10-12 NOTE — Progress Notes (Deleted)
Cardiology Office Note  Date: 10/12/2016   ID: Keith Toniann Fail., DOB 08/31/1940, MRN [id number]  PCP: Dettinger, Fransisca Kaufmann, MD  Primary Cardiologist: Rozann Lesches, MD   No chief complaint on file.   History of Present Illness: Keith Brylin Stanislawski. is a 76 y.o. male that I last saw in July and referred for a diagnostic cardiac catheterization with accelerating angina symptoms. Procedure was performed by Dr. Ellyn Hack on July 5 with findings of relatively stable disease, overall moderate stenosis within the RCA and PDA, as well as patent LAD stent sites with 20% stenoses. Medical therapy was recommended.  Past Medical History:  Diagnosis Date  . Agent orange exposure   . Arthritis   . Coronary artery disease    BMS to mid LAD 2001, DES to proximal LAD 2017  . Enlarged prostate    XRT 2016  . Headache    Sinus headaches   . History of depression   . Hyperlipidemia   . Prostate cancer ([place]) 07/2014   hormonal therapy, external beam radiation therapy  . PTSD (post-traumatic stress disorder)     Past Surgical History:  Procedure Laterality Date  . CARDIAC CATHETERIZATION N/A 12/12/2015   Procedure: Left Heart Cath and Coronary Angiography;  Surgeon: Peter M Martinique, MD;  Location: [place] [facility];  Service: Cardiovascular;  Laterality: N/A;  . CARDIAC CATHETERIZATION N/A 12/12/2015   Procedure: Coronary Stent Intervention;  Surgeon: Peter M Martinique, MD;  Location: [place] [facility];  Service: Cardiovascular;  Laterality: N/A;  . HERNIA REPAIR Right   . LEFT HEART CATH AND CORONARY ANGIOGRAPHY N/A 09/02/2016   Procedure: Left Heart Cath and Coronary Angiography;  Surgeon: Leonie Man, MD;  Location: [place] [facility];  Service: Cardiovascular;  Laterality: N/A;  . PROSTATE BIOPSY N/A 08/28/2014   Procedure: BIOPSY TRANSRECTAL ULTRASONIC PROSTATE (TUBP);  Surgeon: Rana Snare, MD;  [facility];  Service: Urology;  Laterality: N/A;  . TRANSURETHRAL RESECTION OF PROSTATE  N/A 08/28/2014   Procedure: TRANSURETHRAL RESECTION OF THE PROSTATE WITH GYRUS INSTRUMENTS;  Surgeon: Rana Snare, MD;  Location: [facility];  Service: Urology;  Laterality: N/A;    Current Outpatient Prescriptions  Medication Sig Dispense Refill  . aspirin EC 81 MG tablet Take 1 tablet (81 mg total) by mouth daily.    . clopidogrel (PLAVIX) 75 MG tablet Take 1 tablet (75 mg total) by mouth daily. 90 tablet 3  . fluticasone (FLONASE) 50 MCG/ACT nasal spray Place 1 spray into both nostrils 2 (two) times daily as needed for allergies or rhinitis. 16 g 6  . ibuprofen (ADVIL,MOTRIN) 200 MG tablet Take 400 mg by mouth daily as needed for mild pain.    Marland Kitchen levothyroxine (SYNTHROID, LEVOTHROID) 75 MCG tablet Take 75 mcg by mouth daily before breakfast.    . nitroGLYCERIN (NITROSTAT) 0.4 MG SL tablet Place 0.4 mg under the tongue every 5 (five) minutes as needed for chest pain. X 3 doses    . rosuvastatin (CRESTOR) 20 MG tablet Take 1 tablet (20 mg total) by mouth daily. (Patient not taking: Reported on 08/31/2016) 30 tablet 11   No current facility-administered medications for this visit.    Allergies:  Lipitor [atorvastatin]   Social History: The patient  reports that he has never smoked. He has quit using smokeless tobacco. His smokeless tobacco use included Chew. He reports that he does not drink alcohol or use drugs.   Family History: The patient's family history includes Arthritis in his mother.  ROS:  Please see the history of present illness. Otherwise, complete review of systems is positive for {NONE DEFAULTED:18576::"none"}.  All other systems are reviewed and negative.   Physical Exam: VS:  There were no vitals taken for this visit., BMI There is no height or weight on file to calculate BMI.  Wt Readings from Last 3 Encounters:  09/02/16 158 lb (71.7 kg)  08/30/16 163 lb (73.9 kg)  07/13/16 159 lb (72.1 kg)    General: Patient appears comfortable at rest. HEENT: Conjunctiva and lids  normal, oropharynx clear. Neck: Supple, no elevated JVP or carotid bruits, no thyromegaly. Lungs: Clear to auscultation, nonlabored breathing at rest. Cardiac: Regular rate and rhythm, no S3 or significant systolic murmur, no pericardial rub. Abdomen: Soft, nontender, bowel sounds present. Extremities: No pitting edema, distal pulses 2+. Skin: Warm and dry. Musculoskeletal: No kyphosis. Neuropsychiatric: Alert and oriented x3, affect grossly appropriate.  ECG: I personally reviewed the tracing from 08/30/2016 which showed normal sinus rhythm.  Recent Labwork: 09/02/2016: BUN 10; Creatinine, Ser 0.68; Hemoglobin 13.4; Platelets 239; Potassium 4.2; Sodium 139   Other Studies Reviewed Today:  Cardiac catheterization 09/02/2016:   Ost RCA lesion, 45 %stenosed.  RPDA lesion, 60 %stenosed. Stable  Prox LAD DES stent, 20 %stenosed. - Overlaps Mid LAD BMS stent, 20 %stenosed.  The left ventricular systolic function is normal. The left ventricular ejection fraction is 55-65% by visual estimate.  LV end diastolic pressure is normal.   Angiographically only moderate disease in the ostial RCA and RPDA noted. No significant change from 2017 to explain sudden onset of progressive angina.  He was quite hypertensive during the procedure which would suggest possible hypertensive heart disease and may be microvascular ischemia, however there is nothing to explain resting chest pain/pressure.  If symptoms were to persist, could consider noninvasive stress testing to evaluate the PDA lesion, however angiographically did not appear to be any different than last cath.  Assessment and Plan:    Current medicines were reviewed with the patient today.  No orders of the defined types were placed in this encounter.   Disposition:  Signed, Satira Sark, MD, [facility] 10/12/2016 1:18 PM    [place] [facility] [place], [place], [place] [address] Phone: [phone]; Fax:  [phone]

## 2016-10-13 ENCOUNTER — Ambulatory Visit: Payer: Medicare Other | Admitting: Cardiology

## 2016-10-25 ENCOUNTER — Telehealth: Payer: Self-pay | Admitting: Family Medicine

## 2016-11-02 NOTE — Telephone Encounter (Signed)
Referral placed for GI

## 2016-11-26 ENCOUNTER — Ambulatory Visit: Payer: Medicare Other | Admitting: Cardiology

## 2016-11-26 ENCOUNTER — Encounter: Payer: Self-pay | Admitting: Physician Assistant

## 2016-11-26 ENCOUNTER — Ambulatory Visit (INDEPENDENT_AMBULATORY_CARE_PROVIDER_SITE_OTHER): Payer: Medicare Other | Admitting: Physician Assistant

## 2016-11-26 VITALS — BP 128/80 | HR 72 | Temp 96.9°F | Ht 67.5 in | Wt 156.6 lb

## 2016-11-26 DIAGNOSIS — F321 Major depressive disorder, single episode, moderate: Secondary | ICD-10-CM | POA: Diagnosis not present

## 2016-11-26 DIAGNOSIS — N419 Inflammatory disease of prostate, unspecified: Secondary | ICD-10-CM | POA: Diagnosis not present

## 2016-11-26 LAB — URINALYSIS, COMPLETE
Bilirubin, UA: NEGATIVE
Glucose, UA: NEGATIVE
Ketones, UA: NEGATIVE
Leukocytes, UA: NEGATIVE
Nitrite, UA: NEGATIVE
Protein, UA: NEGATIVE
RBC, UA: NEGATIVE
Specific Gravity, UA: 1.015 (ref 1.005–1.030)
Urobilinogen, Ur: 0.2 mg/dL (ref 0.2–1.0)
pH, UA: 6.5 (ref 5.0–7.5)

## 2016-11-26 LAB — MICROSCOPIC EXAMINATION
Bacteria, UA: NONE SEEN
Epithelial Cells (non renal): NONE SEEN /hpf (ref 0–10)
RBC, UA: NONE SEEN /hpf (ref 0–?)
Renal Epithel, UA: NONE SEEN /hpf
WBC, UA: NONE SEEN /hpf (ref 0–?)

## 2016-11-26 MED ORDER — ESCITALOPRAM OXALATE 10 MG PO TABS: 10.0000 mg | ORAL_TABLET | Freq: Every day | ORAL | 1 refills | Status: DC

## 2016-11-26 MED ORDER — CIPROFLOXACIN HCL 500 MG PO TABS: 500.0000 mg | ORAL_TABLET | Freq: Two times a day (BID) | ORAL | 0 refills | Status: DC

## 2016-11-26 NOTE — Progress Notes (Signed)
BP 128/80   Pulse 72   Temp (!) 96.9 F (36.1 C) (Oral)   Ht 5' 7.5" (1.715 m)   Wt 156 lb 9.6 oz (71 kg)   BMI 24.17 kg/m    Subjective:    Patient ID: Keith Olson., male    DOB: [birth year], 76 y.o.   MRN: [id number]  HPI: Keith Olson. is a 76 y.o. male presenting on 11/26/2016 for Abdominal Pain  This patient complains of lower abdominal pain. He is having frequency. He denies burning. He has had treatment for prostate cancer, that this is the worse than normal.  He is to follow-up with his urologist in November.  Patient is also having significant anxiety and depression. His pH Q score is 18. He asked for Xanax. I told him that this is not the way to treat his symptoms and that we need to start a medicine to prevent the anxiety and to improve his depression. Restart Lexapro and he will recheck in 4 weeks  Depression screen [facility] 2/9 11/26/2016 07/13/2016 06/03/2016 05/03/2016 01/27/2016  Decreased Interest 3 0 2 1 3   Down, Depressed, Hopeless 3 0 2 1 3   PHQ - 2 Score 6 0 4 2 6   Altered sleeping 2 - 1 1 2   Tired, decreased energy 3 - 3 2 2   Change in appetite 2 - 1 1 2   Feeling bad or failure about yourself  2 - 0 2 1  Trouble concentrating 2 - 2 0 0  Moving slowly or fidgety/restless 0 - 0 0 0  Suicidal thoughts 1 - 0 - 0  PHQ-9 Score 18 - 11 8 13   Difficult doing work/chores - - - Somewhat difficult -  Some recent data might be hidden    Relevant past medical, surgical, family and social history reviewed and updated as indicated. Allergies and medications reviewed and updated.  Past Medical History:  Diagnosis Date  . Agent orange exposure   . Arthritis   . Coronary artery disease    BMS to mid LAD 2001, DES to proximal LAD 2017  . Enlarged prostate    XRT 2016  . Headache    Sinus headaches   . History of depression   . Hyperlipidemia   . Prostate cancer ([place]) 07/2014   hormonal therapy, external beam radiation therapy  . PTSD (post-traumatic stress  disorder)     Past Surgical History:  Procedure Laterality Date  . CARDIAC CATHETERIZATION N/A 12/12/2015   Procedure: Left Heart Cath and Coronary Angiography;  Surgeon: Peter M Martinique, MD;  Location: [place] [facility];  Service: Cardiovascular;  Laterality: N/A;  . CARDIAC CATHETERIZATION N/A 12/12/2015   Procedure: Coronary Stent Intervention;  Surgeon: Peter M Martinique, MD;  Location: [place] [facility];  Service: Cardiovascular;  Laterality: N/A;  . HERNIA REPAIR Right   . LEFT HEART CATH AND CORONARY ANGIOGRAPHY N/A 09/02/2016   Procedure: Left Heart Cath and Coronary Angiography;  Surgeon: Leonie Man, MD;  Location: [place] [facility];  Service: Cardiovascular;  Laterality: N/A;  . PROSTATE BIOPSY N/A 08/28/2014   Procedure: BIOPSY TRANSRECTAL ULTRASONIC PROSTATE (TUBP);  Surgeon: Rana Snare, MD;  [facility];  Service: Urology;  Laterality: N/A;  . TRANSURETHRAL RESECTION OF PROSTATE N/A 08/28/2014   Procedure: TRANSURETHRAL RESECTION OF THE PROSTATE WITH GYRUS INSTRUMENTS;  Surgeon: Rana Snare, MD;  Location: [facility];  Service: Urology;  Laterality: N/A;    Review of Systems  Constitutional: Negative.  Negative for  appetite change, fatigue and fever.  HENT: Negative.   Eyes: Negative.  Negative for pain and visual disturbance.  Respiratory: Negative.  Negative for cough, chest tightness, shortness of breath and wheezing.   Cardiovascular: Negative.  Negative for chest pain, palpitations and leg swelling.  Gastrointestinal: Negative.  Negative for abdominal pain, diarrhea, nausea and vomiting.  Endocrine: Negative.   Genitourinary: Positive for dysuria and frequency. Negative for hematuria.  Musculoskeletal: Negative.   Skin: Negative.  Negative for color change and rash.  Neurological: Negative.  Negative for weakness, numbness and headaches.  Psychiatric/Behavioral: Positive for decreased concentration and dysphoric mood. The patient is nervous/anxious.      Allergies as of 11/26/2016      Reactions   Lipitor [atorvastatin]    Muscles aches      Medication List       Accurate as of 11/26/16  3:20 PM. Always use your most recent med list.          aspirin EC 81 MG tablet Take 1 tablet (81 mg total) by mouth daily.   ciprofloxacin 500 MG tablet Commonly known as:  CIPRO Take 1 tablet (500 mg total) by mouth 2 (two) times daily.   clopidogrel 75 MG tablet Commonly known as:  PLAVIX Take 1 tablet (75 mg total) by mouth daily.   escitalopram 10 MG tablet Commonly known as:  LEXAPRO Take 1 tablet (10 mg total) by mouth daily.   fluticasone 50 MCG/ACT nasal spray Commonly known as:  FLONASE Place 1 spray into both nostrils 2 (two) times daily as needed for allergies or rhinitis.   ibuprofen 200 MG tablet Commonly known as:  ADVIL,MOTRIN Take 400 mg by mouth daily as needed for mild pain.   levothyroxine 75 MCG tablet Commonly known as:  SYNTHROID, LEVOTHROID Take 75 mcg by mouth daily before breakfast.   nitroGLYCERIN 0.4 MG SL tablet Commonly known as:  NITROSTAT Place 0.4 mg under the tongue every 5 (five) minutes as needed for chest pain. X 3 doses   rosuvastatin 20 MG tablet Commonly known as:  CRESTOR Take 1 tablet (20 mg total) by mouth daily.            Discharge Care Instructions        Start     Ordered   11/26/16 0000  escitalopram (LEXAPRO) 10 MG tablet  Daily    Question:  Supervising Provider  Answer:  Timmothy Euler   11/26/16 1506   11/26/16 0000  ciprofloxacin (CIPRO) 500 MG tablet  2 times daily    Question:  Supervising Provider  Answer:  Timmothy Euler   11/26/16 1506   11/26/16 0000  Urinalysis, Complete     11/26/16 1511         Objective:    BP 128/80   Pulse 72   Temp (!) 96.9 F (36.1 C) (Oral)   Ht 5' 7.5" (1.715 m)   Wt 156 lb 9.6 oz (71 kg)   BMI 24.17 kg/m   Allergies  Allergen Reactions  . Lipitor [Atorvastatin]     Muscles aches    Physical Exam   Constitutional: He appears well-developed and well-nourished.  HENT:  Head: Normocephalic and atraumatic.  Eyes: Pupils are equal, round, and reactive to light. Conjunctivae and EOM are normal.  Neck: Normal range of motion. Neck supple.  Cardiovascular: Normal rate, regular rhythm and normal heart sounds.   Pulmonary/Chest: Effort normal and breath sounds normal.  Abdominal: Soft. Bowel sounds are normal.  Musculoskeletal: Normal range of motion.  Skin: Skin is warm and dry.    Results for orders placed or performed during the hospital encounter of 75/64/33  Basic metabolic panel  Result Value Ref Range   Sodium 139 135 - 145 mmol/L   Potassium 4.2 3.5 - 5.1 mmol/L   Chloride 106 101 - 111 mmol/L   CO2 24 22 - 32 mmol/L   Glucose, Bld 99 65 - 99 mg/dL   BUN 10 6 - 20 mg/dL   Creatinine, Ser 0.68 0.61 - 1.24 mg/dL   Calcium 9.4 8.9 - 10.3 mg/dL   GFR calc non Af Amer >60 >60 mL/min   GFR calc Af Amer >60 >60 mL/min   Anion gap 9 5 - 15  CBC  Result Value Ref Range   WBC 5.8 4.0 - 10.5 K/uL   RBC 4.46 4.22 - 5.81 MIL/uL   Hemoglobin 13.4 13.0 - 17.0 g/dL   HCT 40.4 39.0 - 52.0 %   MCV 90.6 78.0 - 100.0 fL   MCH 30.0 26.0 - 34.0 pg   MCHC 33.2 30.0 - 36.0 g/dL   RDW 13.1 11.5 - 15.5 %   Platelets 239 150 - 400 K/uL  Protime-INR  Result Value Ref Range   Prothrombin Time 13.1 11.4 - 15.2 seconds   INR 0.99       Assessment & Plan:   1. Prostatitis, unspecified prostatitis type - ciprofloxacin (CIPRO) 500 MG tablet; Take 1 tablet (500 mg total) by mouth 2 (two) times daily.  Dispense: 28 tablet; Refill: 0 - Urinalysis, Complete  2. Depression, major, single episode, moderate (HCC) - escitalopram (LEXAPRO) 10 MG tablet; Take 1 tablet (10 mg total) by mouth daily.  Dispense: 30 tablet; Refill: 1 Follow up with Dr. Warrick Parisian in 4 weeks for the addition of Lexapro   Current Outpatient Prescriptions:  .  aspirin EC 81 MG tablet, Take 1 tablet (81 mg total) by mouth  daily., Disp: , Rfl:  .  clopidogrel (PLAVIX) 75 MG tablet, Take 1 tablet (75 mg total) by mouth daily., Disp: 90 tablet, Rfl: 3 .  fluticasone (FLONASE) 50 MCG/ACT nasal spray, Place 1 spray into both nostrils 2 (two) times daily as needed for allergies or rhinitis., Disp: 16 g, Rfl: 6 .  ibuprofen (ADVIL,MOTRIN) 200 MG tablet, Take 400 mg by mouth daily as needed for mild pain., Disp: , Rfl:  .  levothyroxine (SYNTHROID, LEVOTHROID) 75 MCG tablet, Take 75 mcg by mouth daily before breakfast., Disp: , Rfl:  .  nitroGLYCERIN (NITROSTAT) 0.4 MG SL tablet, Place 0.4 mg under the tongue every 5 (five) minutes as needed for chest pain. X 3 doses, Disp: , Rfl:  .  rosuvastatin (CRESTOR) 20 MG tablet, Take 1 tablet (20 mg total) by mouth daily., Disp: 30 tablet, Rfl: 11 .  ciprofloxacin (CIPRO) 500 MG tablet, Take 1 tablet (500 mg total) by mouth 2 (two) times daily., Disp: 28 tablet, Rfl: 0 .  escitalopram (LEXAPRO) 10 MG tablet, Take 1 tablet (10 mg total) by mouth daily., Disp: 30 tablet, Rfl: 1 Continue all other maintenance medications as listed above.  Follow up plan: Return in about 4 weeks (around 12/24/2016) for PCP Dettinger for recheck.  Educational handout given for [place] PA-C [place] [address]  [place], [place] [address] [phone]   11/26/2016, 3:20 PM

## 2016-11-26 NOTE — Patient Instructions (Signed)
In a few days you may receive a survey in the mail or online from [employer] regarding your visit with us today. Please take a moment to fill this out. Your feedback is very important to our whole office. It can help us better understand your needs as well as improve your experience and satisfaction. Thank you for taking your time to complete it. We care about you.  Michaiah Holsopple, PA-C  

## 2016-12-21 ENCOUNTER — Ambulatory Visit: Payer: Medicare Other

## 2017-01-17 NOTE — Progress Notes (Deleted)
Cardiology Office Note  Date: 01/17/2017   ID: Christopherjohn Toniann Fail., DOB 12-04-1940, MRN [id number]  PCP: Dettinger, Fransisca Kaufmann, MD  Primary Cardiologist: Rozann Lesches, MD   No chief complaint on file.   History of Present Illness: Nancy Morgon Pamer. is a 76 y.o. male last seen in July.  Cardiac catheterization from July of this year revealed patent stent sites within the LAD with 20% in-stent restenosis, 40-60% RCA and PDA stenoses that were managed medically.  Past Medical History:  Diagnosis Date  . Agent orange exposure   . Arthritis   . Coronary artery disease    BMS to mid LAD 2001, DES to proximal LAD 2017  . Enlarged prostate    XRT 2016  . Headache    Sinus headaches   . History of depression   . Hyperlipidemia   . Prostate cancer ([place]) 07/2014   hormonal therapy, external beam radiation therapy  . PTSD (post-traumatic stress disorder)     Past Surgical History:  Procedure Laterality Date  . BIOPSY TRANSRECTAL ULTRASONIC PROSTATE (TUBP) N/A 08/28/2014   Performed by Rana Snare, MD at [facility]  . Coronary Stent Intervention N/A 12/12/2015   Performed by Martinique, Peter M, MD at [place] [facility]  . HERNIA REPAIR Right   . Left Heart Cath and Coronary Angiography N/A 09/02/2016   Performed by Leonie Man, MD at [place] [facility]  . Left Heart Cath and Coronary Angiography N/A 12/12/2015   Performed by Martinique, Peter M, MD at [place] [facility]  . TRANSURETHRAL RESECTION OF THE PROSTATE WITH GYRUS INSTRUMENTS N/A 08/28/2014   Performed by Rana Snare, MD at [facility]    Current Outpatient Medications  Medication Sig Dispense Refill  . aspirin EC 81 MG tablet Take 1 tablet (81 mg total) by mouth daily.    . ciprofloxacin (CIPRO) 500 MG tablet Take 1 tablet (500 mg total) by mouth 2 (two) times daily. 28 tablet 0  . clopidogrel (PLAVIX) 75 MG tablet Take 1 tablet (75 mg total) by mouth daily. 90 tablet 3  . escitalopram (LEXAPRO) 10 MG tablet Take 1 tablet (10  mg total) by mouth daily. 30 tablet 1  . fluticasone (FLONASE) 50 MCG/ACT nasal spray Place 1 spray into both nostrils 2 (two) times daily as needed for allergies or rhinitis. 16 g 6  . ibuprofen (ADVIL,MOTRIN) 200 MG tablet Take 400 mg by mouth daily as needed for mild pain.    Marland Kitchen levothyroxine (SYNTHROID, LEVOTHROID) 75 MCG tablet Take 75 mcg by mouth daily before breakfast.    . nitroGLYCERIN (NITROSTAT) 0.4 MG SL tablet Place 0.4 mg under the tongue every 5 (five) minutes as needed for chest pain. X 3 doses    . rosuvastatin (CRESTOR) 20 MG tablet Take 1 tablet (20 mg total) by mouth daily. 30 tablet 11   No current facility-administered medications for this visit.    Allergies:  Lipitor [atorvastatin]   Social History: The patient  reports that  has never smoked. He has quit using smokeless tobacco. His smokeless tobacco use included chew. He reports that he does not drink alcohol or use drugs.   Family History: The patient's family history includes Arthritis in his mother.   ROS:  Please see the history of present illness. Otherwise, complete review of systems is positive for {NONE DEFAULTED:18576::"none"}.  All other systems are reviewed and negative.   Physical Exam: VS:  There were no vitals taken for this visit.,  BMI There is no height or weight on file to calculate BMI.  Wt Readings from Last 3 Encounters:  11/26/16 156 lb 9.6 oz (71 kg)  09/02/16 158 lb (71.7 kg)  08/30/16 163 lb (73.9 kg)    General: Patient appears comfortable at rest. HEENT: Conjunctiva and lids normal, oropharynx clear with moist mucosa. Neck: Supple, no elevated JVP or carotid bruits, no thyromegaly. Lungs: Clear to auscultation, nonlabored breathing at rest. Cardiac: Regular rate and rhythm, no S3 or significant systolic murmur, no pericardial rub. Abdomen: Soft, nontender, no hepatomegaly, bowel sounds present, no guarding or rebound. Extremities: No pitting edema, distal pulses 2+. Skin: Warm and  dry. Musculoskeletal: No kyphosis. Neuropsychiatric: Alert and oriented x3, affect grossly appropriate.  ECG: I personally reviewed the tracing from 08/30/2016 which showed normal sinus rhythm.  Recent Labwork: 09/02/2016: BUN 10; Creatinine, Ser 0.68; Hemoglobin 13.4; Platelets 239; Potassium 4.2; Sodium 139   Other Studies Reviewed Today:  Cardiac catheterization 09/02/2016:  Ost RCA lesion, 45 %stenosed.  RPDA lesion, 60 %stenosed. Stable  Prox LAD DES stent, 20 %stenosed. - Overlaps Mid LAD BMS stent, 20 %stenosed.  The left ventricular systolic function is normal. The left ventricular ejection fraction is 55-65% by visual estimate.  LV end diastolic pressure is normal.   Angiographically only moderate disease in the ostial RCA and RPDA noted. No significant change from 2017 to explain sudden onset of progressive angina.  Assessment and Plan:    Current medicines were reviewed with the patient today.  No orders of the defined types were placed in this encounter.   Disposition:  Signed, Satira Sark, MD, [facility] 01/17/2017 12:58 PM    [place] [facility] [place], [place], [place] [address] Phone: [phone]; Fax: [phone]

## 2017-01-18 ENCOUNTER — Ambulatory Visit: Payer: Medicare Other | Admitting: Cardiology

## 2017-01-18 DIAGNOSIS — C61 Malignant neoplasm of prostate: Secondary | ICD-10-CM | POA: Diagnosis not present

## 2017-01-24 DIAGNOSIS — R102 Pelvic and perineal pain: Secondary | ICD-10-CM | POA: Diagnosis not present

## 2017-02-14 ENCOUNTER — Ambulatory Visit: Payer: Medicare Other

## 2017-03-03 ENCOUNTER — Ambulatory Visit: Payer: Medicare Other | Admitting: Cardiology

## 2017-03-23 NOTE — Progress Notes (Signed)
Cardiology Office Note  Date: 03/25/2017   ID: Keith Toniann Fail., DOB [birth year], MRN [id number]  PCP: Dettinger, Fransisca Kaufmann, MD  Primary Cardiologist: Rozann Lesches, MD   Chief Complaint  Patient presents with  . Coronary Artery Disease    History of Present Illness: Keith Kortland Nichols. is a 77 y.o. male last seen in July 2018. He was referred for a cardiac catheterization at that time in the setting of worsening angina symptoms. Procedure was performed by Dr. Ellyn Hack as detailed below, no clear culprit for PCI with patent LAD stent sites, moderate disease within RPDA which was stable. Medical therapy was recommended.  He presents today for follow-up with his wife. Overall, states that he feels better and was reassured by the procedure. He does have intermittent chest pain symptoms but remains active and is comfortable continuing with the current plan as long as symptoms do not escalate further. We went over his medications which are unchanged and outlined below.  Past Medical History:  Diagnosis Date  . Agent orange exposure   . Arthritis   . Coronary artery disease    BMS to mid LAD 2001, DES to proximal LAD 2017  . Enlarged prostate    XRT 2016  . Headache    Sinus headaches   . History of depression   . Hyperlipidemia   . Prostate cancer ([place]) 07/2014   hormonal therapy, external beam radiation therapy  . PTSD (post-traumatic stress disorder)     Past Surgical History:  Procedure Laterality Date  . CARDIAC CATHETERIZATION N/A 12/12/2015   Procedure: Left Heart Cath and Coronary Angiography;  Surgeon: Peter M Martinique, MD;  Location: [place] [facility];  Service: Cardiovascular;  Laterality: N/A;  . CARDIAC CATHETERIZATION N/A 12/12/2015   Procedure: Coronary Stent Intervention;  Surgeon: Peter M Martinique, MD;  Location: [place] [facility];  Service: Cardiovascular;  Laterality: N/A;  . HERNIA REPAIR Right   . LEFT HEART CATH AND CORONARY ANGIOGRAPHY N/A 09/02/2016   Procedure: Left Heart Cath and Coronary Angiography;  Surgeon: Leonie Man, MD;  Location: [place] [facility];  Service: Cardiovascular;  Laterality: N/A;  . PROSTATE BIOPSY N/A 08/28/2014   Procedure: BIOPSY TRANSRECTAL ULTRASONIC PROSTATE (TUBP);  Surgeon: Rana Snare, MD;  [facility];  Service: Urology;  Laterality: N/A;  . TRANSURETHRAL RESECTION OF PROSTATE N/A 08/28/2014   Procedure: TRANSURETHRAL RESECTION OF THE PROSTATE WITH GYRUS INSTRUMENTS;  Surgeon: Rana Snare, MD;  [facility];  Service: Urology;  Laterality: N/A;    Current Outpatient Medications  Medication Sig Dispense Refill  . aspirin EC 81 MG tablet Take 1 tablet (81 mg total) by mouth daily.    . clopidogrel (PLAVIX) 75 MG tablet Take 1 tablet (75 mg total) by mouth daily. 90 tablet 3  . ibuprofen (ADVIL,MOTRIN) 200 MG tablet Take 400 mg by mouth daily as needed for mild pain.    . nitroGLYCERIN (NITROSTAT) 0.4 MG SL tablet Place 0.4 mg under the tongue every 5 (five) minutes as needed for chest pain. X 3 doses    . rosuvastatin (CRESTOR) 20 MG tablet Take 1 tablet (20 mg total) by mouth daily. 30 tablet 11  . levothyroxine (SYNTHROID, LEVOTHROID) 75 MCG tablet Take 75 mcg by mouth daily before breakfast.     No current facility-administered medications for this visit.    Allergies:  Lipitor [atorvastatin]   Social History: The patient  reports that  has never smoked. He has quit using smokeless tobacco. His  smokeless tobacco use included chew. He reports that he does not drink alcohol or use drugs.   ROS:  Please see the history of present illness. Otherwise, complete review of systems is positive for none.  All other systems are reviewed and negative.   Physical Exam: VS:  BP 140/80   Pulse 70   Ht 5\' 8"  (1.727 m)   Wt 163 lb 3.2 oz (74 kg)   SpO2 96%   BMI 24.81 kg/m , BMI Body mass index is 24.81 kg/m.  Wt Readings from Last 3 Encounters:  03/25/17 163 lb 3.2 oz (74 kg)  11/26/16 156 lb  9.6 oz (71 kg)  09/02/16 158 lb (71.7 kg)    General: Patient appears comfortable at rest. HEENT: Conjunctiva and lids normal, oropharynx clear. Neck: Supple, no elevated JVP or carotid bruits, no thyromegaly. Lungs: Clear to auscultation, nonlabored breathing at rest. Cardiac: Regular rate and rhythm, no S3 or significant systolic murmur, no pericardial rub. Abdomen: Soft, nontender, bowel sounds present. Extremities: No pitting edema, distal pulses 2+. Skin: Warm and dry. Musculoskeletal: No kyphosis. Neuropsychiatric: Alert and oriented x3, affect grossly appropriate.  ECG: I personally reviewed the tracing from 08/30/2016 which showed normal sinus rhythm.  Recent Labwork: 09/02/2016: BUN 10; Creatinine, Ser 0.68; Hemoglobin 13.4; Platelets 239; Potassium 4.2; Sodium 139   Other Studies Reviewed Today:  Cardiac catheterization 09/02/2016:  Ost RCA lesion, 45 %stenosed.  RPDA lesion, 60 %stenosed. Stable  Prox LAD DES stent, 20 %stenosed. - Overlaps Mid LAD BMS stent, 20 %stenosed.  The left ventricular systolic function is normal. The left ventricular ejection fraction is 55-65% by visual estimate.  LV end diastolic pressure is normal.   Angiographically only moderate disease in the ostial RCA and RPDA noted. No significant change from 2017 to explain sudden onset of progressive angina.  He was quite hypertensive during the procedure which would suggest possible hypertensive heart disease and may be microvascular ischemia, however there is nothing to explain resting chest pain/pressure.  If symptoms were to persist, could consider noninvasive stress testing to evaluate the PDA lesion, however angiographically did not appear to be any different than last cath.  Assessment and Plan:  1. CAD with angina. Recent cardiac catheterization reviewed above demonstrating patent stent sites and moderate disease within the RPDA which will be managed medically at this time. Patient was  reassured by the findings and we will plan to continue medical therapy.  2. Mixed hyperlipidemia, on Crestor.  Current medicines were reviewed with the patient today.  Disposition: Follow-up in 6 months.  Signed, Satira Sark, MD, [facility] 03/25/2017 9:10 AM    [place] at [place], [place], [place] [address] Phone: [phone]; Fax: [phone]

## 2017-03-25 ENCOUNTER — Ambulatory Visit (INDEPENDENT_AMBULATORY_CARE_PROVIDER_SITE_OTHER): Payer: Medicare Other | Admitting: Cardiology

## 2017-03-25 ENCOUNTER — Encounter: Payer: Self-pay | Admitting: Cardiology

## 2017-03-25 VITALS — BP 140/80 | HR 70 | Ht 68.0 in | Wt 163.2 lb

## 2017-03-25 DIAGNOSIS — E782 Mixed hyperlipidemia: Secondary | ICD-10-CM

## 2017-03-25 DIAGNOSIS — I25119 Atherosclerotic heart disease of native coronary artery with unspecified angina pectoris: Secondary | ICD-10-CM

## 2017-03-25 NOTE — Patient Instructions (Signed)

## 2017-05-06 DIAGNOSIS — R3912 Poor urinary stream: Secondary | ICD-10-CM | POA: Diagnosis not present

## 2017-05-06 DIAGNOSIS — R3914 Feeling of incomplete bladder emptying: Secondary | ICD-10-CM | POA: Diagnosis not present

## 2017-05-06 DIAGNOSIS — R102 Pelvic and perineal pain: Secondary | ICD-10-CM | POA: Diagnosis not present

## 2017-05-06 DIAGNOSIS — C61 Malignant neoplasm of prostate: Secondary | ICD-10-CM | POA: Diagnosis not present

## 2017-05-06 DIAGNOSIS — N401 Enlarged prostate with lower urinary tract symptoms: Secondary | ICD-10-CM | POA: Diagnosis not present

## 2017-05-25 DIAGNOSIS — Z79899 Other long term (current) drug therapy: Secondary | ICD-10-CM | POA: Diagnosis not present

## 2017-05-25 DIAGNOSIS — G894 Chronic pain syndrome: Secondary | ICD-10-CM | POA: Diagnosis not present

## 2017-05-25 DIAGNOSIS — C61 Malignant neoplasm of prostate: Secondary | ICD-10-CM | POA: Diagnosis not present

## 2017-05-25 DIAGNOSIS — Z79891 Long term (current) use of opiate analgesic: Secondary | ICD-10-CM | POA: Diagnosis not present

## 2017-07-04 ENCOUNTER — Encounter: Payer: Self-pay | Admitting: Family Medicine

## 2017-07-04 ENCOUNTER — Ambulatory Visit (INDEPENDENT_AMBULATORY_CARE_PROVIDER_SITE_OTHER): Payer: Medicare Other | Admitting: Family Medicine

## 2017-07-04 VITALS — BP 136/82 | HR 67 | Temp 97.9°F | Ht 68.0 in | Wt 162.0 lb

## 2017-07-04 DIAGNOSIS — L989 Disorder of the skin and subcutaneous tissue, unspecified: Secondary | ICD-10-CM | POA: Diagnosis not present

## 2017-07-04 DIAGNOSIS — H938X2 Other specified disorders of left ear: Secondary | ICD-10-CM

## 2017-07-04 NOTE — Progress Notes (Signed)
Subjective: CC: scalp lesion, left ear PCP: Dettinger, Fransisca Kaufmann, MD [id number] Keith Olson. is a 77 y.o. male presenting to clinic today for:  1. Scalp lesion Patient reports that he has had a lesion on his scalp that has been nonhealing and bleeds after coming his hair.  Lesion has been present for about 2 to 3 months.  He denies spontaneous bleeding, changes in shape, size, texture or color.  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  Past medical history is significant for prostate cancer and coronary artery disease.  He has not seen a dermatologist.  Denies any other suspicious skin lesions that he is concerned about.  2.  Left ear irritation Patient reports a 2 to 3-day history of left-sided irritation of the ear and neck.  He reports a fluttering sensation that goes from the neck up to the behind the left ear.  Denies any inner ear pain, discharge, fevers, chills.  Sensation lasts about 6 seconds and is described as a "fluttering sensation".  He notes that this is been occurring about every hour over the last couple of days.  Denies any associated muscle spasm or pain.  No change in function.   ROS: Per HPI  Allergies  Allergen Reactions  . Lipitor [Atorvastatin]     Muscles aches   Past Medical History:  Diagnosis Date  . Agent orange exposure   . Arthritis   . Coronary artery disease    BMS to mid LAD 2001, DES to proximal LAD 2017  . Enlarged prostate    XRT 2016  . Headache    Sinus headaches   . History of depression   . Hyperlipidemia   . Prostate cancer ([name]) 07/2014   hormonal therapy, external beam radiation therapy  . PTSD (post-traumatic stress disorder)     Current Outpatient Medications:  .  aspirin EC 81 MG tablet, Take 1 tablet (81 mg total) by mouth daily., Disp: , Rfl:  .  clopidogrel (PLAVIX) 75 MG tablet, Take 1 tablet (75 mg total) by mouth daily.,  Disp: 90 tablet, Rfl: 3 .  ibuprofen (ADVIL,MOTRIN) 200 MG tablet, Take 400 mg by mouth daily as needed for mild pain., Disp: , Rfl:  .  levothyroxine (SYNTHROID, LEVOTHROID) 75 MCG tablet, Take 75 mcg by mouth daily before breakfast., Disp: , Rfl:  .  nitroGLYCERIN (NITROSTAT) 0.4 MG SL tablet, Place 0.4 mg under the tongue every 5 (five) minutes as needed for chest pain. X 3 doses, Disp: , Rfl:  .  rosuvastatin (CRESTOR) 20 MG tablet, Take 1 tablet (20 mg total) by mouth daily., Disp: 30 tablet, Rfl: 11 Social History   Socioeconomic History  . Marital status: Married    Spouse name: Not on file  . Number of children: Not on file  . Years of education: Not on file  . Highest education level: Not on file  Occupational History  . Occupation: retired  Scientific laboratory technician  . Financial resource strain: Not on file  . Food insecurity:    Worry: Not on file    Inability: Not on file  . Transportation needs:    Medical: Not on file    Non-medical: Not on file  Tobacco Use  . Smoking status: Never Smoker  . Smokeless tobacco: Former Systems developer    Types: Chew  Substance and Sexual Activity  . Alcohol use: No  .  Drug use: No  . Sexual activity: Yes  Lifestyle  . Physical activity:    Days per week: Not on file    Minutes per session: Not on file  . Stress: Not on file  Relationships  . Social connections:    Talks on phone: Not on file    Gets together: Not on file    Attends religious service: Not on file    Active member of club or organization: Not on file    Attends meetings of clubs or organizations: Not on file    Relationship status: Not on file  . Intimate partner violence:    Fear of current or ex partner: Not on file    Emotionally abused: Not on file    Physically abused: Not on file    Forced sexual activity: Not on file  Other Topics Concern  . Not on file  Social History Narrative   Married   5 children   Family History  Problem Relation Age of Onset  . Arthritis  Mother     Objective: Office vital signs reviewed. BP 136/82   Pulse 67   Temp 97.9 F (36.6 C) (Oral)   Ht 5\' 8"  (1.727 m)   Wt 162 lb (73.5 kg)   BMI 24.63 kg/m   Physical Examination:  General: Awake, alert, well nourished, No acute distress HEENT: Normal    Neck: No masses palpated. No lymphadenopathy; no JVD    Ears: Tympanic membranes intact, normal light reflex, no erythema, no bulging    Eyes: PERRLA, extraocular membranes intact, sclera white    Nose: nasal turbinates moist, no nasal discharge    Throat: moist mucus membranes, no erythema, no tonsillar exudate.  Airway is patent Cardio: regular rate and rhythm, S1S2 heard, no murmurs appreciated Pulm: clear to auscultation bilaterally, no wheezes, rhonchi or rales; normal work of breathing on room air Extremities: warm, well perfused, No edema, cyanosis or clubbing; +2 pulses bilaterally MSK: normal gait and normal station Skin: There is an area at the apex of the scalp that is approximately 1/4 inch x 1/4 inch in size.  There is a dried scab formation over the top.  Assessment/ Plan: 77 y.o. male   1. Lesion of skin of scalp Given exposure to sun and HPI, I have high suspicion for a squamous cell carcinoma.  Because of the area and size of the lesion and current anticoagulation with Plavix, I have recommended that patient have this biopsied by dermatology.  I have placed an urgent referral to dermatology for evaluation and management of this lesion. - Ambulatory referral to Dermatology  2. Irritation of ear, left No evidence of ear infection.  What he describes almost sounds like a spasm within the neck but he has no associated pain.  It is more irritating than painful.  I recommended monitoring and if symptoms do not improve or if he develops any signs or symptoms of infection to call me for further instructions.  He voiced good understanding will follow-up as needed.   Orders Placed This Encounter  Procedures  .  Ambulatory referral to Dermatology    Referral Priority:   Urgent    Referral Type:   Consultation    Referral Reason:   Specialty Services Required    Requested Specialty:   Dermatology    Number of Visits Requested:   [place], [place] [phone]

## 2017-07-04 NOTE — Patient Instructions (Signed)
There is no evidence of ear infection on exam today.  The scalp lesion does need to be evaluated more closely.  I am suspicious of a possible squamous cell carcinoma given its bleeding and nonhealing properties.  I have placed a referral to dermatology urgently to have this biopsied and evaluated further.  The sensation that you are getting your neck is nonspecific.  If it persists, we could consider taking an x-ray of your neck.  However, you are not demonstrating any worrisome symptoms or signs.  There are no musculoskeletal abnormalities appreciated.  There were no associated rashes.  Consider getting a massage to see if this may help symptoms.   Squamous Cell Carcinoma Squamous cell carcinoma is a common form of skin cancer. It begins in the squamous cells in the outer layer of the skin (epidermis). It occurs most often in parts of the body that are frequently exposed to the sun, such as the face, lips, neck, arms, legs, and hands. However, this condition can occur anywhere on the body, including the inside of the mouth, sites of long-term (chronic) scarring, and the anus. If squamous cell carcinoma is treated soon enough, it rarely spreads to other areas of the body (metastasizes). If it is not treated, it can destroy nearby tissues. In rare cases, it can spread to other areas. What are the causes? This condition is usually caused by exposure to ultraviolet (UV) light. UV light may come from the sun or from tanning beds. Other causes include:  Exposure to arsenic.  Exposure to radiation.  Exposure to toxic tars and oils.  Certain genetic conditions, such as xeroderma pigmentosum.  What increases the risk? This condition is more likely to develop in:  People who are older than 75 years of age.  People who have fair skin (light complexion).  People who have blonde or red hair.  People who have blue, green, or gray eyes.  People who have childhood freckling.  People who have had sun  exposure over long periods of time, especially during childhood.  People who have had repeated sunburns.  People who have a weakened immune system.  People who have been exposed to certain chemicals, such as tar, soot, and arsenic.  People who have chronic inflammatory conditions.  People who have chronic infections.  People who have an HPV (human papillomavirus) infection.  People who have conditions that cause chronic scarring. These can include burn scars, chronic ulcers, heat (thermal) injuries, and radiation.  People who have had psoralen and ultraviolet A (PUVA) treatments.  People who smoke.  People who use tanning beds.  What are the signs or symptoms? This condition often starts as small pink or [place] growths on the skin. The growths have a rough surface that may feel like sandpaper. In some cases, the growths are easier to feel than to see. The growths may develop into a sore that does not heal. How is this diagnosed? This condition may be diagnosed with:  A physical exam.  Removal of a tissue sample to be examined under a microscope (biopsy).  How is this treated? Treatment for this condition involves removing the cancerous tissue. The method that is used for this depends on the size and location of the tumors, as well as your overall health. Possible treatments include:  Mohs surgery. In this procedure, the cancerous skin cells are removed layer by layer until all of the tumor has been removed.  Surgical removal (excision) of the tumor. This involves removing the entire tumor  and a small amount of normal skin that surrounds it.  Cryosurgery. This involves freezing the tumor with liquid nitrogen.  Plastic surgery. The tumor is removed, and healthy skin from another part of the body is used to cover the wound. This may be done for large tumors that are in areas where it is not possible to stretch the nearby skin to sew the edges of the wound together.  Radiation.  This may be used for tumors on the face.  Photodynamic therapy. A chemical cream is applied to the skin, and light exposure is used to activate the chemical.  Electrodesiccation and curettage. This involves alternately scraping and burning the tumor while using an electric current to control bleeding.  Follow these instructions at home:  Avoid unprotected sun exposure.  Do self-exams as told by your health care provider. Look for new spots or changes in your skin.  Keep all follow-up visits as told by your health care provider. This is important.  Do not use tobacco products, including cigarettes, chewing tobacco, or e-cigarettes. If you need help quitting, ask your health care provider. How is this prevented?  Avoid the sun when it is the strongest. This is usually between 10:00 a.m. and 4:00 p.m.  When you are out in the sun, use a sunscreen that has a sun protection factor (SPF) of at least 40.  Apply sunscreen at least 30 minutes before exposure to the sun.  Reapply sunscreen every 2-4 hours while you are outside. Also reapply it after swimming and after excessive sweating.  Always wear hats, protective clothing, and UV-blocking sunglasses when you are outdoors.  Do not use tanning beds. Contact a health care provider if:  You notice any new growths or any changes in your skin.  You have had a squamous cell carcinoma tumor removed and you notice a new growth in the same location. This information is not intended to replace advice given to you by your health care provider. Make sure you discuss any questions you have with your health care provider. Document Released: 08/22/2002 Document Revised: 10/14/2015 Document Reviewed: 06/10/2014 Elsevier Interactive Patient Education  [employer].

## 2017-08-26 ENCOUNTER — Ambulatory Visit (INDEPENDENT_AMBULATORY_CARE_PROVIDER_SITE_OTHER): Payer: Medicare Other | Admitting: Nurse Practitioner

## 2017-08-26 ENCOUNTER — Encounter: Payer: Self-pay | Admitting: Nurse Practitioner

## 2017-08-26 VITALS — BP 126/78 | HR 62 | Temp 96.9°F | Ht 68.0 in | Wt 164.0 lb

## 2017-08-26 DIAGNOSIS — S41132A Puncture wound without foreign body of left upper arm, initial encounter: Secondary | ICD-10-CM | POA: Diagnosis not present

## 2017-08-26 DIAGNOSIS — Z23 Encounter for immunization: Secondary | ICD-10-CM | POA: Diagnosis not present

## 2017-08-26 MED ORDER — SULFAMETHOXAZOLE-TRIMETHOPRIM 800-160 MG PO TABS: 1.0000 | ORAL_TABLET | Freq: Two times a day (BID) | ORAL | 0 refills | Status: DC

## 2017-08-26 NOTE — Patient Instructions (Signed)

## 2017-08-26 NOTE — Addendum Note (Signed)
Addended by: Chevis Pretty on: 08/26/2017 03:48 PM   Modules accepted: Orders

## 2017-08-26 NOTE — Addendum Note (Signed)
Addended by: Rolena Infante on: 08/26/2017 04:09 PM   Modules accepted: Orders

## 2017-08-26 NOTE — Progress Notes (Addendum)
   Subjective:    Patient ID: Keith Toniann Fail., male    DOB: 24-Sep-1940, 77 y.o.   MRN: [id number]   Chief Complaint: Cut left arm on mole trap   HPI Patient had a mole trap fall off wall in his tool shed and cut his left arm- was old and rusty.    Review of Systems  Respiratory: Negative.   Cardiovascular: Negative.   Skin: Positive for wound (left forearm).  Neurological: Negative.   Psychiatric/Behavioral: Negative.   All other systems reviewed and are negative.      Objective:   Physical Exam  Constitutional: He appears well-developed and well-nourished. No distress.  Cardiovascular: Normal rate.  Pulmonary/Chest: Effort normal.  Skin: Skin is warm.  4 erythematous small puncture wounds to left forearm.   BP 126/78   Pulse 62   Temp (!) 96.9 F (36.1 C) (Oral)   Ht 5\' 8"  (1.727 m)   Wt 164 lb (74.4 kg)   BMI 24.94 kg/m      Assessment & Plan:  Keith Toniann Fail. in today with chief complaint of Cut left arm on mole trap   1. Puncture wound of multiple sites of left upper extremity, initial encounter Clean with antibacteral soap and antibiotic ointment Do not pik or scratch at area. Td today RTO prn  Meds ordered this encounter  Medications  . sulfamethoxazole-trimethoprim (BACTRIM DS) 800-160 MG tablet    Sig: Take 1 tablet by mouth 2 (two) times daily.    Dispense:  20 tablet    Refill:  0    Order Specific Question:   Supervising Provider    Answer:   Eustaquio Maize [[id number]]    Mary-Margaret Hassell Done, FNP

## 2017-09-05 ENCOUNTER — Inpatient Hospital Stay (HOSPITAL_COMMUNITY)
Admission: EM | Admit: 2017-09-05 | Discharge: 2017-09-07 | DRG: 247 | Disposition: A | Discharge status: Home / Self Care | Payer: Medicare Other | Attending: Cardiology | Admitting: Cardiology

## 2017-09-05 ENCOUNTER — Encounter (HOSPITAL_COMMUNITY): Payer: Self-pay | Admitting: Emergency Medicine

## 2017-09-05 ENCOUNTER — Other Ambulatory Visit: Payer: Self-pay

## 2017-09-05 ENCOUNTER — Telehealth: Payer: Self-pay | Admitting: Cardiology

## 2017-09-05 ENCOUNTER — Emergency Department (HOSPITAL_COMMUNITY): Payer: Medicare Other

## 2017-09-05 DIAGNOSIS — E039 Hypothyroidism, unspecified: Secondary | ICD-10-CM | POA: Diagnosis present

## 2017-09-05 DIAGNOSIS — R0789 Other chest pain: Secondary | ICD-10-CM | POA: Diagnosis not present

## 2017-09-05 DIAGNOSIS — I25110 Atherosclerotic heart disease of native coronary artery with unstable angina pectoris: Secondary | ICD-10-CM | POA: Diagnosis present

## 2017-09-05 DIAGNOSIS — R001 Bradycardia, unspecified: Secondary | ICD-10-CM | POA: Diagnosis present

## 2017-09-05 DIAGNOSIS — Z575 Occupational exposure to toxic agents in other industries: Secondary | ICD-10-CM | POA: Diagnosis not present

## 2017-09-05 DIAGNOSIS — T82855A Stenosis of coronary artery stent, initial encounter: Principal | ICD-10-CM | POA: Diagnosis present

## 2017-09-05 DIAGNOSIS — R079 Chest pain, unspecified: Secondary | ICD-10-CM | POA: Diagnosis present

## 2017-09-05 DIAGNOSIS — Z7902 Long term (current) use of antithrombotics/antiplatelets: Secondary | ICD-10-CM | POA: Diagnosis not present

## 2017-09-05 DIAGNOSIS — N401 Enlarged prostate with lower urinary tract symptoms: Secondary | ICD-10-CM | POA: Diagnosis not present

## 2017-09-05 DIAGNOSIS — Z8249 Family history of ischemic heart disease and other diseases of the circulatory system: Secondary | ICD-10-CM

## 2017-09-05 DIAGNOSIS — Z8659 Personal history of other mental and behavioral disorders: Secondary | ICD-10-CM | POA: Insufficient documentation

## 2017-09-05 DIAGNOSIS — R0602 Shortness of breath: Secondary | ICD-10-CM | POA: Diagnosis not present

## 2017-09-05 DIAGNOSIS — Y636 Underdosing and nonadministration of necessary drug, medicament or biological substance: Secondary | ICD-10-CM | POA: Diagnosis present

## 2017-09-05 DIAGNOSIS — Y838 Other surgical procedures as the cause of abnormal reaction of the patient, or of later complication, without mention of misadventure at the time of the procedure: Secondary | ICD-10-CM | POA: Diagnosis present

## 2017-09-05 DIAGNOSIS — Z923 Personal history of irradiation: Secondary | ICD-10-CM | POA: Diagnosis not present

## 2017-09-05 DIAGNOSIS — Z888 Allergy status to other drugs, medicaments and biological substances status: Secondary | ICD-10-CM

## 2017-09-05 DIAGNOSIS — E7800 Pure hypercholesterolemia, unspecified: Secondary | ICD-10-CM | POA: Diagnosis not present

## 2017-09-05 DIAGNOSIS — Z8546 Personal history of malignant neoplasm of prostate: Secondary | ICD-10-CM

## 2017-09-05 DIAGNOSIS — Z79899 Other long term (current) drug therapy: Secondary | ICD-10-CM

## 2017-09-05 DIAGNOSIS — F419 Anxiety disorder, unspecified: Secondary | ICD-10-CM | POA: Diagnosis present

## 2017-09-05 DIAGNOSIS — Z79890 Hormone replacement therapy (postmenopausal): Secondary | ICD-10-CM

## 2017-09-05 DIAGNOSIS — Z7982 Long term (current) use of aspirin: Secondary | ICD-10-CM

## 2017-09-05 DIAGNOSIS — N400 Enlarged prostate without lower urinary tract symptoms: Secondary | ICD-10-CM | POA: Diagnosis present

## 2017-09-05 DIAGNOSIS — E785 Hyperlipidemia, unspecified: Secondary | ICD-10-CM | POA: Diagnosis present

## 2017-09-05 DIAGNOSIS — I200 Unstable angina: Secondary | ICD-10-CM

## 2017-09-05 DIAGNOSIS — I10 Essential (primary) hypertension: Secondary | ICD-10-CM | POA: Diagnosis present

## 2017-09-05 DIAGNOSIS — N138 Other obstructive and reflux uropathy: Secondary | ICD-10-CM | POA: Diagnosis present

## 2017-09-05 DIAGNOSIS — Z955 Presence of coronary angioplasty implant and graft: Secondary | ICD-10-CM | POA: Diagnosis not present

## 2017-09-05 DIAGNOSIS — Z9079 Acquired absence of other genital organ(s): Secondary | ICD-10-CM

## 2017-09-05 DIAGNOSIS — F329 Major depressive disorder, single episode, unspecified: Secondary | ICD-10-CM | POA: Diagnosis present

## 2017-09-05 DIAGNOSIS — F4310 Post-traumatic stress disorder, unspecified: Secondary | ICD-10-CM | POA: Diagnosis present

## 2017-09-05 DIAGNOSIS — I2510 Atherosclerotic heart disease of native coronary artery without angina pectoris: Secondary | ICD-10-CM | POA: Diagnosis present

## 2017-09-05 HISTORY — DX: Hypothyroidism, unspecified: E03.9

## 2017-09-05 LAB — CBC WITH DIFFERENTIAL/PLATELET
Basophils Absolute: 0.1 10*3/uL (ref 0.0–0.1)
Basophils Relative: 1 %
Eosinophils Absolute: 0.4 10*3/uL (ref 0.0–0.7)
Eosinophils Relative: 6 %
HCT: 39.8 % (ref 39.0–52.0)
Hemoglobin: 13.5 g/dL (ref 13.0–17.0)
Lymphocytes Relative: 44 %
Lymphs Abs: 3.2 10*3/uL (ref 0.7–4.0)
MCH: 30.8 pg (ref 26.0–34.0)
MCHC: 33.9 g/dL (ref 30.0–36.0)
MCV: 90.7 fL (ref 78.0–100.0)
Monocytes Absolute: 0.6 10*3/uL (ref 0.1–1.0)
Monocytes Relative: 8 %
Neutro Abs: 2.9 10*3/uL (ref 1.7–7.7)
Neutrophils Relative %: 41 %
Platelets: 245 10*3/uL (ref 150–400)
RBC: 4.39 MIL/uL (ref 4.22–5.81)
RDW: 13.4 % (ref 11.5–15.5)
WBC: 7.1 10*3/uL (ref 4.0–10.5)

## 2017-09-05 LAB — MAGNESIUM: Magnesium: 2.4 mg/dL (ref 1.7–2.4)

## 2017-09-05 LAB — BRAIN NATRIURETIC PEPTIDE: B Natriuretic Peptide: 16 pg/mL (ref 0.0–100.0)

## 2017-09-05 LAB — COMPREHENSIVE METABOLIC PANEL
ALT: 24 U/L (ref 0–44)
AST: 23 U/L (ref 15–41)
Albumin: 4.3 g/dL (ref 3.5–5.0)
Alkaline Phosphatase: 69 U/L (ref 38–126)
Anion gap: 7 (ref 5–15)
BUN: 14 mg/dL (ref 8–23)
CO2: 23 mmol/L (ref 22–32)
Calcium: 8.6 mg/dL — ABNORMAL LOW (ref 8.9–10.3)
Chloride: 105 mmol/L (ref 98–111)
Creatinine, Ser: 0.92 mg/dL (ref 0.61–1.24)
GFR calc Af Amer: >60 mL/min (ref >60)
GFR calc non Af Amer: >60 mL/min (ref >60)
Glucose, Bld: 98 mg/dL (ref 70–99)
Potassium: 4 mmol/L (ref 3.5–5.1)
Sodium: 135 mmol/L (ref 135–145)
Total Bilirubin: 0.5 mg/dL (ref 0.3–1.2)
Total Protein: 7.2 g/dL (ref 6.5–8.1)

## 2017-09-05 LAB — TROPONIN I
Troponin I: <0.03 ng/mL (ref <0.03)
Troponin I: <0.03 ng/mL (ref <0.03)

## 2017-09-05 LAB — TSH: TSH: 5.877 u[IU]/mL — ABNORMAL HIGH (ref 0.350–4.500)

## 2017-09-05 LAB — PHOSPHORUS: Phosphorus: 3.1 mg/dL (ref 2.5–4.6)

## 2017-09-05 IMAGING — DX DG CHEST 2V
2 series · 2 of 2 positions shown · non-contrast
Comparison: None

CLINICAL DATA: Chest pain and shortness of breath.

EXAM:
CHEST - 2 VIEW

[chest pa]
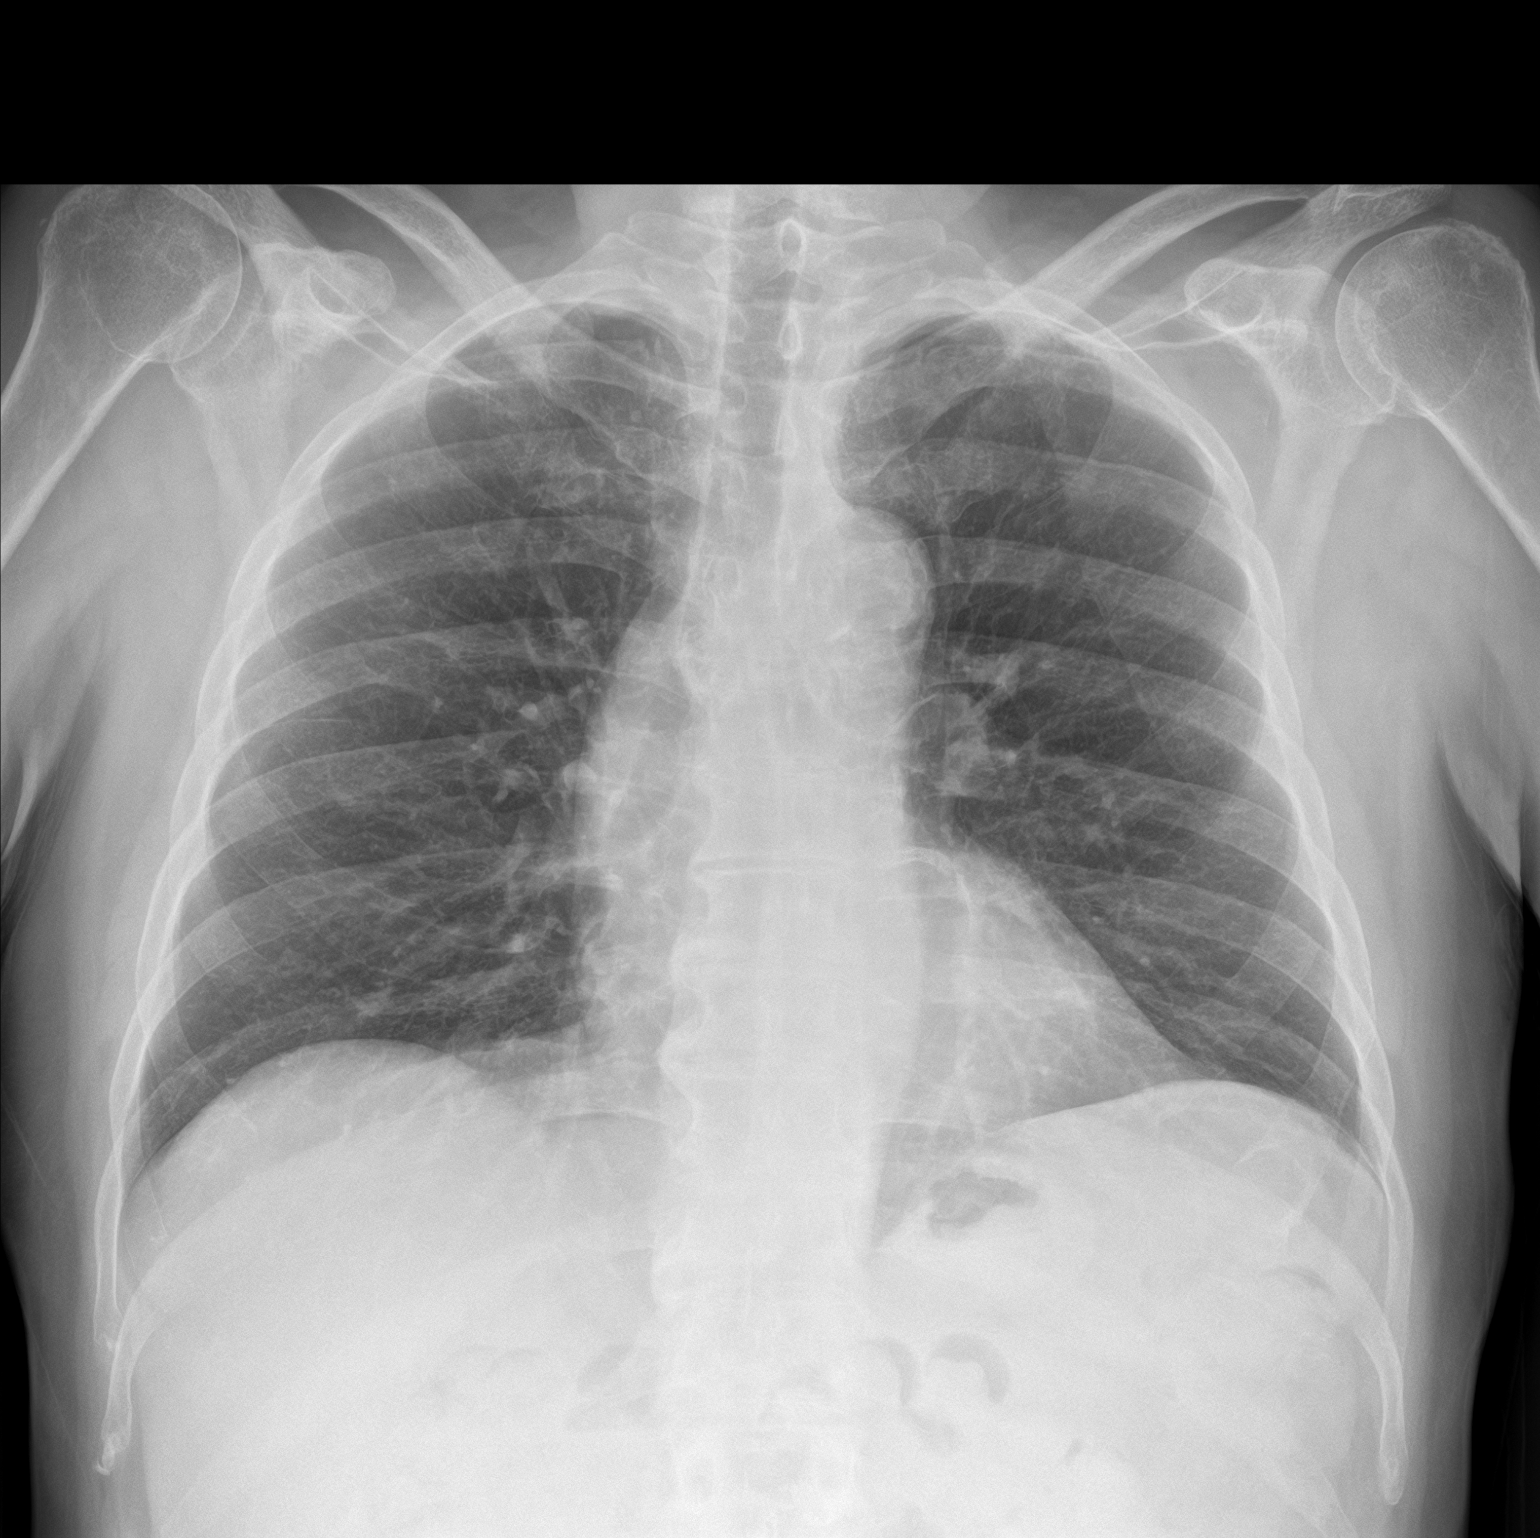

[chest lat]
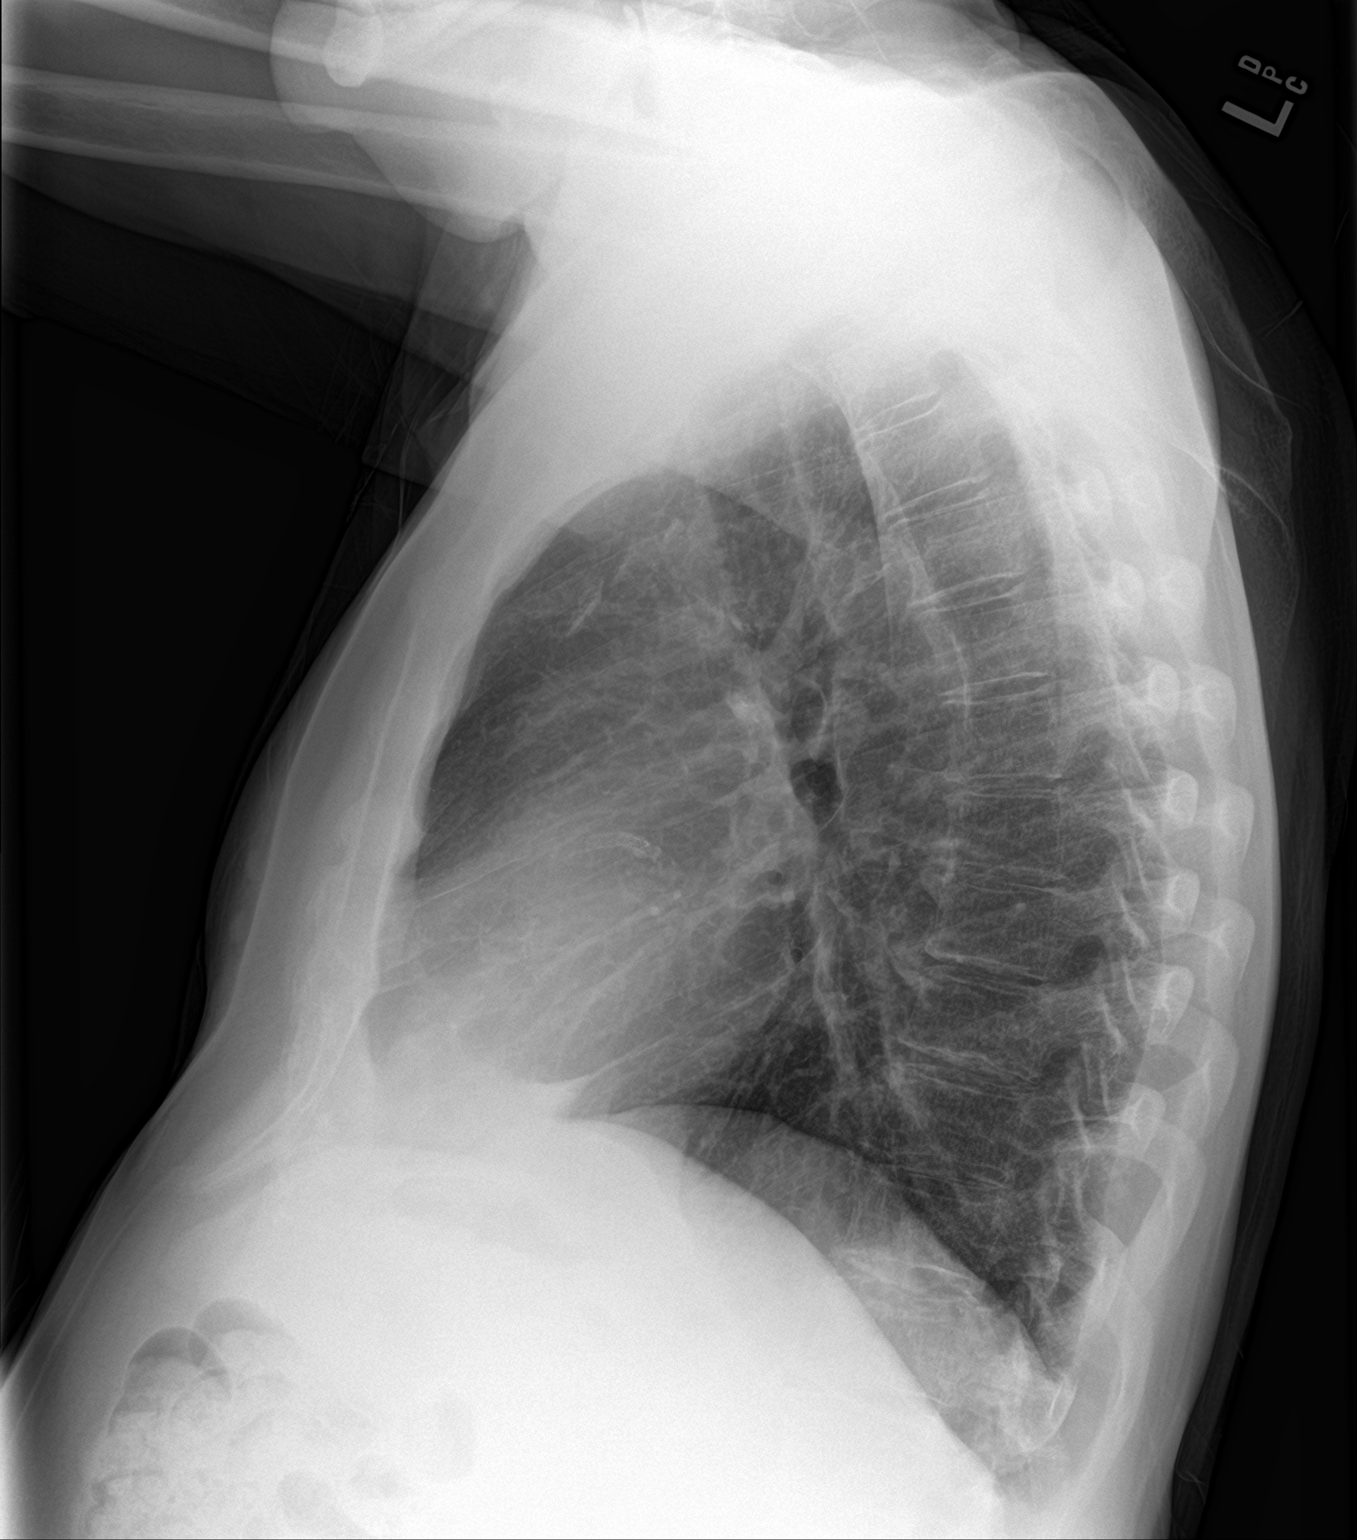

[2 of 2 positions shown; findings below may reference images not displayed]

FINDINGS: Atherosclerotic calcifications noted within the thoracic aorta. Lad
coronary artery stent noted. The heart size and mediastinal contours
are within normal limits. Both lungs are clear. The visualized
skeletal structures are unremarkable.
IMPRESSION: No active cardiopulmonary disease.

Aortic Atherosclerosis ([VZ]-[VZ]).

## 2017-09-05 MED ORDER — ENOXAPARIN SODIUM 40 MG/0.4ML ~~LOC~~ SOLN
40.0000 mg | SUBCUTANEOUS | Status: DC
  Administered 2017-09-05: 40 mg via SUBCUTANEOUS
  Filled 2017-09-05: qty 0.4

## 2017-09-05 MED ORDER — MORPHINE SULFATE (PF) 4 MG/ML IV SOLN
4.0000 mg | Freq: Once | INTRAVENOUS | Status: AC
  Administered 2017-09-05: 4 mg via INTRAVENOUS
  Filled 2017-09-05: qty 1

## 2017-09-05 MED ORDER — ONDANSETRON HCL 4 MG/2ML IJ SOLN
4.0000 mg | Freq: Once | INTRAMUSCULAR | Status: AC
  Administered 2017-09-06: 4 mg via INTRAVENOUS
  Filled 2017-09-05: qty 2

## 2017-09-05 MED ORDER — CLOPIDOGREL BISULFATE 75 MG PO TABS
300.0000 mg | ORAL_TABLET | Freq: Once | ORAL | Status: AC
  Administered 2017-09-05: 300 mg via ORAL
  Filled 2017-09-05: qty 4

## 2017-09-05 MED ORDER — MORPHINE SULFATE (PF) 4 MG/ML IV SOLN
4.0000 mg | Freq: Once | INTRAVENOUS | Status: AC
  Administered 2017-09-06: 4 mg via INTRAVENOUS
  Filled 2017-09-05: qty 1

## 2017-09-05 MED ORDER — KETOROLAC TROMETHAMINE 30 MG/ML IJ SOLN
15.0000 mg | Freq: Once | INTRAMUSCULAR | Status: AC
  Administered 2017-09-05: 15 mg via INTRAVENOUS
  Filled 2017-09-05: qty 1

## 2017-09-05 MED ORDER — FAMOTIDINE 20 MG PO TABS
20.0000 mg | ORAL_TABLET | Freq: Every day | ORAL | Status: DC
  Administered 2017-09-05 – 2017-09-07 (×3): 20 mg via ORAL
  Filled 2017-09-05 (×3): qty 1

## 2017-09-05 MED ORDER — CLOPIDOGREL BISULFATE 75 MG PO TABS
75.0000 mg | ORAL_TABLET | Freq: Every day | ORAL | Status: DC
  Administered 2017-09-06: 75 mg via ORAL
  Filled 2017-09-05 (×2): qty 1

## 2017-09-05 NOTE — ED Provider Notes (Signed)
[facility] EMERGENCY DEPARTMENT Provider Note   CSN: [id number] Arrival date & time: 09/05/17  1742     History   Chief Complaint Chief Complaint  Patient presents with  . Chest Pain    HPI Keith Olson. is a 77 y.o. male.  HPI Patient presents with concern of chest pressure and pain, as well as dyspnea. Patient has multiple medical issues, most notably CAD, 2 prior stent placements. Patient ran out of Plavix 1 month ago. About 2 weeks ago began to have chest pressure, with occasional exacerbations This has become worse, more persistent, more severe over the past days, with increasing dyspnea with exertion. At rest the pressure is minimal, but symptoms are pronounced with activity. No lightheadedness, no syncope, no fever, no cough, no swelling, no weight gain, no weight loss. He is here with his wife and a family member who assists with HPI. It sounds as though the patient spoke with his cardiology office today and was referred here for evaluation.  Past Medical History:  Diagnosis Date  . Agent orange exposure   . Arthritis   . Coronary artery disease    BMS to mid LAD 2001, DES to proximal LAD 2017  . Enlarged prostate    XRT 2016  . Headache    Sinus headaches   . History of depression   . Hyperlipidemia   . Prostate cancer ([place]) 07/2014   hormonal therapy, external beam radiation therapy  . PTSD (post-traumatic stress disorder)     Patient Active Problem List   Diagnosis Date Noted  . Depression, major, single episode, moderate ([place]) 11/26/2016  . Prostatitis 11/26/2016  . Rectal bleeding 02/25/2016  . Pain management contract signed 01/27/2016  . Constipation 04/22/2015  . Lower abdominal pain 04/22/2015  . Anxiety state 12/16/2014  . Malignant neoplasm of prostate ([place]) 09/19/2014  . 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 Hyperlipidemia 05/22/2012  . BPH (benign prostatic hyperplasia) 05/22/2012  . Chest pain 02/24/2012  . CAD (coronary  artery disease) 02/24/2012    Past Surgical History:  Procedure Laterality Date  . CARDIAC CATHETERIZATION N/A 12/12/2015   Procedure: Left Heart Cath and Coronary Angiography;  Surgeon: Peter M Martinique, MD;  Location: [facility];  Service: Cardiovascular;  Laterality: N/A;  . CARDIAC CATHETERIZATION N/A 12/12/2015   Procedure: Coronary Stent Intervention;  Surgeon: Peter M Martinique, MD;  Location: [facility];  Service: Cardiovascular;  Laterality: N/A;  . HERNIA REPAIR Right   . LEFT HEART CATH AND CORONARY ANGIOGRAPHY N/A 09/02/2016   Procedure: Left Heart Cath and Coronary Angiography;  Surgeon: Leonie Man, MD;  Location: [facility];  Service: Cardiovascular;  Laterality: N/A;  . PROSTATE BIOPSY N/A 08/28/2014   Procedure: BIOPSY TRANSRECTAL ULTRASONIC PROSTATE (TUBP);  Surgeon: Rana Snare, MD;  Location: [facility];  Service: Urology;  Laterality: N/A;  . TRANSURETHRAL RESECTION OF PROSTATE N/A 08/28/2014   Procedure: TRANSURETHRAL RESECTION OF THE PROSTATE WITH GYRUS INSTRUMENTS;  Surgeon: Rana Snare, MD;  Location: [facility];  Service: Urology;  Laterality: N/A;        Home Medications    Prior to Admission medications   Medication Sig Start Date End Date Taking? Authorizing Provider  aspirin EC 81 MG tablet Take 1 tablet (81 mg total) by mouth daily. 12/09/15  Yes Satira Sark, MD  clopidogrel (PLAVIX) 75 MG tablet Take 1 tablet (75 mg total) by mouth daily. 01/08/16  Yes Satira Sark, MD  nitroGLYCERIN (NITROSTAT)  0.4 MG SL tablet Place 0.4 mg under the tongue every 5 (five) minutes as needed for chest pain. X 3 doses   Yes [provider]  levothyroxine (SYNTHROID, LEVOTHROID) 75 MCG tablet Take 75 mcg by mouth daily before breakfast.    [provider]  rosuvastatin (CRESTOR) 20 MG tablet Take 1 tablet (20 mg total) by mouth daily. Patient not taking: Reported on 09/05/2017 12/12/15   Barrett, Evelene Croon, PA-C    sulfamethoxazole-trimethoprim (BACTRIM DS) 800-160 MG tablet Take 1 tablet by mouth 2 (two) times daily. Patient not taking: Reported on 09/05/2017 08/26/17   Chevis Pretty, FNP    Family History Family History  Problem Relation Age of Onset  . Arthritis Mother     Social History Social History   Tobacco Use  . Smoking status: Never Smoker  . Smokeless tobacco: Former Systems developer    Types: Chew  Substance Use Topics  . Alcohol use: No  . Drug use: No     Allergies   Lipitor [atorvastatin]   Review of Systems Review of Systems  Constitutional:       Per HPI, otherwise negative  HENT:       Per HPI, otherwise negative  Respiratory:       Per HPI, otherwise negative  Cardiovascular:       Per HPI, otherwise negative  Gastrointestinal: Negative for vomiting.  Endocrine:       Negative aside from HPI  Genitourinary:       Neg aside from HPI   Musculoskeletal:       Per HPI, otherwise negative  Skin: Negative.   Neurological: Negative for syncope.     Physical Exam Updated Vital Signs BP 118/67   Pulse (!) 51   Temp 97.8 F (36.6 C) (Oral)   Resp 14   Ht 5\' 8"  (1.727 m)   Wt 74.4 kg (164 lb)   SpO2 96%   BMI 24.94 kg/m   Physical Exam  Constitutional: He is oriented to person, place, and time. He appears well-developed. No distress.  HENT:  Head: Normocephalic and atraumatic.  Eyes: Conjunctivae and EOM are normal.  Cardiovascular: Normal rate and regular rhythm.  Pulmonary/Chest: Effort normal. No stridor. No respiratory distress.  Abdominal: He exhibits no distension.  Musculoskeletal: He exhibits no edema.  Neurological: He is alert and oriented to person, place, and time.  Skin: Skin is warm and dry.  Psychiatric: He has a normal mood and affect.  Nursing note and vitals reviewed.    ED Treatments / Results  Labs (all labs ordered are listed, but only abnormal results are displayed) Labs Reviewed  COMPREHENSIVE METABOLIC PANEL -  Abnormal; Notable for the following components:      Result Value   Calcium 8.6 (*)    All other components within normal limits  TROPONIN I  CBC WITH DIFFERENTIAL/PLATELET  BRAIN NATRIURETIC PEPTIDE  MAGNESIUM  PHOSPHORUS  TROPONIN I  TROPONIN I    EKG EKG Interpretation  Date/Time:  Monday September 05 2017 17:46:58 EDT Ventricular Rate:  62 PR Interval:  168 QRS Duration: 90 QT Interval:  410 QTC Calculation: 416 R Axis:   -36 Text Interpretation:  Normal sinus rhythm Left axis deviation T wave abnormality No significant change since last tracing Abnormal ECG Confirmed by Carmin Muskrat [phone]) on 09/05/2017 6:43:25 PM   Radiology Dg Chest 2 View  Result Date: 09/05/2017 CLINICAL DATA:  Chest pain and shortness of breath. EXAM: CHEST - 2 VIEW COMPARISON:  None  FINDINGS: Atherosclerotic calcifications noted within the thoracic aorta. Lad coronary artery stent noted. The heart size and mediastinal contours are within normal limits. Both lungs are clear. The visualized skeletal structures are unremarkable. IMPRESSION: No active cardiopulmonary disease. Aortic Atherosclerosis (ICD10-I70.0). Electronically Signed   By: Kerby Moors M.D.   On: 09/05/2017 18:24    Procedures Procedures (including critical care time)  Medications Ordered in ED Medications  clopidogrel (PLAVIX) tablet 300 mg (has no administration in time range)  clopidogrel (PLAVIX) tablet 75 mg (has no administration in time range)  famotidine (PEPCID) tablet 20 mg (has no administration in time range)  enoxaparin (LOVENOX) injection 40 mg (has no administration in time range)  morphine 4 MG/ML injection 4 mg (4 mg Intravenous Given 09/05/17 1917)  ketorolac (TORADOL) 30 MG/ML injection 15 mg (15 mg Intravenous Given 09/05/17 1918)     Initial Impression / Assessment and Plan / ED Course  I have reviewed the triage vital signs and the nursing notes.  Pertinent labs & imaging results that were available during my  care of the patient were reviewed by me and considered in my medical decision making (see chart for details).     8:39 PM On repeat exam the patient is in no distress, pain is improved with narcotics, Toradol. We discussed all findings including concern for exertional chest pain, unstable angina, though his initial troponin was normal. Given his elevated heart score, history, absence of using Plavix, previously as directed, patient will be admitted for further evaluation and management.   Final Clinical Impressions(s) / ED Diagnoses   Final diagnoses:  Atypical chest pain     Carmin Muskrat, MD 09/05/17 2040

## 2017-09-05 NOTE — H&P (Signed)
4        History and Physical    Keith Olson. XTK:[id number] DOB: 1940/05/12 DOA: 09/05/2017  PCP: Dettinger, Fransisca Kaufmann, MD   Patient coming from: Home.  I have personally briefly reviewed patient's old medical records in [place]  Chief Complaint: Chest pain.  HPI: Keith Olson. is a 77 y.o. male with medical history significant of agent orange exposure, osteoarthritis, enlarged prostate, prostate cancer, history of sinus headaches, depression, anxiety, history of PTSD, hyperlipidemia, hypothyroidism, CAD, history of 2 stent placements who is coming to the emergency department with complaints of progressively worse dyspnea on exertion for the past 3 weeks weeks associated with previous sternal chest pressure, palpitations and nausea, which gets relief with rest.  He mentions that he ran out of his Plavix about a month ago.  He has continued taking daily aspirin.  He denies PND, orthopnea or pitting edema of the lower extremities.  No fever, chills, sore throat, wheezing, hemoptysis, abdominal pain, emesis, diarrhea, constipation, melena or hematochezia.  Denies dysuria, frequency or hematuria.  No heat or cold intolerance.  No polyuria, polydipsia, polyphagia or blurred vision.  He denies of skin rashes.  ED Course: Initial vital signs temperature 97.8 F, pulse 58, respirations 16, blood pressure 138/75 mmHg and O2 sat 98% on room air.  EKG shows normal sinus rhythm with left axis deviation and T wave abnormality.  Not significantly changed from previous.  Troponin level x2 has been negative.  BNP was 16.0 pg/mL CBC was normal.  CMP showed a calcium of 8.6 mg/dL, but all other values are within normal limits.  TSH was 5.877 uIU/mL.  His chest radiograph show aortic atherosclerosis, but no acute cardiopulmonary pathology.  Review of Systems: As per HPI otherwise 10 point review of systems negative.    Past Medical History:  Diagnosis Date  . Agent orange exposure   . Arthritis    . Coronary artery disease    BMS to mid LAD 2001, DES to proximal LAD 2017  . Enlarged prostate    XRT 2016  . Headache    Sinus headaches   . History of depression   . Hyperlipidemia   . Hypothyroidism   . Prostate cancer ([place]) 07/2014   hormonal therapy, external beam radiation therapy  . PTSD (post-traumatic stress disorder)     Past Surgical History:  Procedure Laterality Date  . CARDIAC CATHETERIZATION N/A 12/12/2015   Procedure: Left Heart Cath and Coronary Angiography;  Surgeon: Peter M Martinique, MD;  Location: [facility];  Service: Cardiovascular;  Laterality: N/A;  . CARDIAC CATHETERIZATION N/A 12/12/2015   Procedure: Coronary Stent Intervention;  Surgeon: Peter M Martinique, MD;  Location: [facility];  Service: Cardiovascular;  Laterality: N/A;  . HERNIA REPAIR Right   . LEFT HEART CATH AND CORONARY ANGIOGRAPHY N/A 09/02/2016   Procedure: Left Heart Cath and Coronary Angiography;  Surgeon: Leonie Man, MD;  Location: [facility];  Service: Cardiovascular;  Laterality: N/A;  . PROSTATE BIOPSY N/A 08/28/2014   Procedure: BIOPSY TRANSRECTAL ULTRASONIC PROSTATE (TUBP);  Surgeon: Rana Snare, MD;  Location: [facility];  Service: Urology;  Laterality: N/A;  . TRANSURETHRAL RESECTION OF PROSTATE N/A 08/28/2014   Procedure: TRANSURETHRAL RESECTION OF THE PROSTATE WITH GYRUS INSTRUMENTS;  Surgeon: Rana Snare, MD;  Location: [facility];  Service: Urology;  Laterality: N/A;     reports that he has never smoked. He has quit using smokeless tobacco. His smokeless tobacco use included  chew. He reports that he does not drink alcohol or use drugs.  Allergies  Allergen Reactions  . Lipitor [Atorvastatin]     Muscles aches    Family History  Problem Relation Age of Onset  . Arthritis Mother     Prior to Admission medications   Medication Sig Start Date End Date Taking? Authorizing Provider  aspirin EC 81 MG tablet Take 1 tablet (81 mg total) by mouth daily. 12/09/15   Yes Satira Sark, MD  clopidogrel (PLAVIX) 75 MG tablet Take 1 tablet (75 mg total) by mouth daily. 01/08/16  Yes Satira Sark, MD  nitroGLYCERIN (NITROSTAT) 0.4 MG SL tablet Place 0.4 mg under the tongue every 5 (five) minutes as needed for chest pain. X 3 doses   Yes [provider]  levothyroxine (SYNTHROID, LEVOTHROID) 75 MCG tablet Take 75 mcg by mouth daily before breakfast.    [provider]  rosuvastatin (CRESTOR) 20 MG tablet Take 1 tablet (20 mg total) by mouth daily. Patient not taking: Reported on 09/05/2017 12/12/15   Barrett, Evelene Croon, PA-C  sulfamethoxazole-trimethoprim (BACTRIM DS) 800-160 MG tablet Take 1 tablet by mouth 2 (two) times daily. Patient not taking: Reported on 09/05/2017 08/26/17   Chevis Pretty, FNP    Physical Exam: Vitals:   09/05/17 1930 09/05/17 1945 09/05/17 2000 09/05/17 2015  BP: 125/72  118/67   Pulse: (!) 53 (!) 53 (!) 51 (!) 51  Resp: 16 16 15 14   Temp:      TempSrc:      SpO2: 96% 98% 96% 96%  Weight:      Height:        Constitutional: NAD, calm, comfortable Eyes: PERRL, lids and conjunctivae normal ENMT: Mucous membranes are moist. Posterior pharynx clear of any exudate or lesions.Normal dentition.  Neck: normal, supple, no masses, no thyromegaly Respiratory: clear to auscultation bilaterally, no wheezing, no crackles. Normal respiratory effort. No accessory muscle use.  Cardiovascular: Regular rate and rhythm, no murmurs / rubs / gallops. No extremity edema. 2+ pedal pulses. No carotid bruits.  Abdomen: no tenderness, no masses palpated. No hepatosplenomegaly. Bowel sounds positive.  Musculoskeletal: no clubbing / cyanosis. Good ROM, no contractures. Normal muscle tone.  Skin: no rashes, lesions, ulcers on limited dermatological examination. Neurologic: CN 2-12 grossly intact. Sensation intact, DTR normal. Strength 5/5 in all 4.  Psychiatric: Normal judgment and insight. Alert and oriented x 3. Normal  mood.   Labs on Admission: I have personally reviewed following labs and imaging studies  CBC: Recent Labs  Lab 09/05/17 1840  WBC 7.1  NEUTROABS 2.9  HGB 13.5  HCT 39.8  MCV 90.7  PLT 176   Basic Metabolic Panel: Recent Labs  Lab 09/05/17 1840  NA 135  K 4.0  CL 105  CO2 23  GLUCOSE 98  BUN 14  CREATININE 0.92  CALCIUM 8.6*  MG 2.4  PHOS 3.1   GFR: Estimated Creatinine Clearance: 67.1 mL/min (by C-G formula based on SCr of 0.92 mg/dL). Liver Function Tests: Recent Labs  Lab 09/05/17 1840  AST 23  ALT 24  ALKPHOS 69  BILITOT 0.5  PROT 7.2  ALBUMIN 4.3   No results for input(s): LIPASE, AMYLASE in the last 168 hours. No results for input(s): AMMONIA in the last 168 hours. Coagulation Profile: No results for input(s): INR, PROTIME in the last 168 hours. Cardiac Enzymes: Recent Labs  Lab 09/05/17 1840  TROPONINI <0.03   BNP (last 3 results) No results for input(s): PROBNP  in the last 8760 hours. HbA1C: No results for input(s): HGBA1C in the last 72 hours. CBG: No results for input(s): GLUCAP in the last 168 hours. Lipid Profile: No results for input(s): CHOL, HDL, LDLCALC, TRIG, CHOLHDL, LDLDIRECT in the last 72 hours. Thyroid Function Tests: No results for input(s): TSH, T4TOTAL, FREET4, T3FREE, THYROIDAB in the last 72 hours. Anemia Panel: No results for input(s): VITAMINB12, FOLATE, FERRITIN, TIBC, IRON, RETICCTPCT in the last 72 hours. Urine analysis:    Component Value Date/Time   APPEARANCEUR Clear 11/26/2016 1514   GLUCOSEU Negative 11/26/2016 1514   BILIRUBINUR Negative 11/26/2016 1514   PROTEINUR Negative 11/26/2016 1514   UROBILINOGEN negative 03/07/2014 1059   NITRITE Negative 11/26/2016 1514   LEUKOCYTESUR Negative 11/26/2016 1514    Radiological Exams on Admission: Dg Chest 2 View  Result Date: 09/05/2017 CLINICAL DATA:  Chest pain and shortness of breath. EXAM: CHEST - 2 VIEW COMPARISON:  None FINDINGS: Atherosclerotic  calcifications noted within the thoracic aorta. Lad coronary artery stent noted. The heart size and mediastinal contours are within normal limits. Both lungs are clear. The visualized skeletal structures are unremarkable. IMPRESSION: No active cardiopulmonary disease. Aortic Atherosclerosis (ICD10-I70.0). Electronically Signed   By: Kerby Moors M.D.   On: 09/05/2017 18:24    EKG: Independently reviewed. Vent. rate 62 BPM PR interval 168 ms QRS duration 90 ms QT/QTc 410/416 ms P-R-T axes 21 -36 16 Normal sinus rhythm Left axis deviation T wave abnormality  Assessment/Plan Principal Problem:   Chest pain Observation/telemetry. Check troponin level. Check echocardiogram in the morning. Continue aspirin. Loading dose 300 mg of Plavix. Consult cardiology in the morning.  Active Problems:   CAD (coronary artery disease) On aspirin and Crestor. Resume Plavix 75 mg p.o. daily in the morning. No beta-blocker due to baseline bradycardia.    Hyperlipidemia Continue Crestor p.o. daily. Check LFTs as needed.    BPH (benign prostatic hyperplasia) Frequent nocturia. Advised to see urology for follow-up in the future.    Hypothyroidism Has not taken this medication in over a month. TSH is slightly increased. Resume levothyroxine 5 mcg p.o. daily.    DVT prophylaxis: SQ Lovenox. Code Status: Full code. Family Communication: Disposition Plan: Observation for troponin level trending, echocardiogram in a.m. and cardiology consult. Consults called: Routine cardiology consult. Admission status: Observation/telemetry.   Reubin Milan MD [facility] Pager [phone].  If 7PM-7AM, please contact night-coverage [email or web address] Password [facility]  09/05/2017, 9:23 PM

## 2017-09-05 NOTE — ED Notes (Signed)
Pt took a 324mg  aspirin this morning at 0700

## 2017-09-05 NOTE — ED Triage Notes (Signed)
PT c/o center chest pressure x2 weeks with SOB on exertion. PT states he called his cardiologist office and was told to come to ED for eval.

## 2017-09-05 NOTE — Telephone Encounter (Signed)
Advised patient it would be best for him to go to the ER for evaluation. Patient verbalized understanding and wife taking to [facility]. Advised patient to take a nitroglycerin.

## 2017-09-05 NOTE — Telephone Encounter (Signed)
Patient called stating that approximately a week ago he started having shortness of breath and Chest pains off and on . States that today his chest feels very tight.

## 2017-09-06 ENCOUNTER — Encounter (HOSPITAL_COMMUNITY): Payer: Self-pay

## 2017-09-06 ENCOUNTER — Inpatient Hospital Stay (HOSPITAL_COMMUNITY)
Admission: EM | Disposition: A | Discharge status: Home / Self Care | Payer: Self-pay | Source: Home / Self Care | Attending: Cardiology

## 2017-09-06 ENCOUNTER — Observation Stay (HOSPITAL_BASED_OUTPATIENT_CLINIC_OR_DEPARTMENT_OTHER): Payer: Medicare Other

## 2017-09-06 DIAGNOSIS — Z955 Presence of coronary angioplasty implant and graft: Secondary | ICD-10-CM | POA: Diagnosis not present

## 2017-09-06 DIAGNOSIS — Z7982 Long term (current) use of aspirin: Secondary | ICD-10-CM | POA: Diagnosis not present

## 2017-09-06 DIAGNOSIS — Z7902 Long term (current) use of antithrombotics/antiplatelets: Secondary | ICD-10-CM | POA: Diagnosis not present

## 2017-09-06 DIAGNOSIS — F419 Anxiety disorder, unspecified: Secondary | ICD-10-CM | POA: Diagnosis present

## 2017-09-06 DIAGNOSIS — Z575 Occupational exposure to toxic agents in other industries: Secondary | ICD-10-CM | POA: Diagnosis not present

## 2017-09-06 DIAGNOSIS — Z8546 Personal history of malignant neoplasm of prostate: Secondary | ICD-10-CM | POA: Diagnosis not present

## 2017-09-06 DIAGNOSIS — Y838 Other surgical procedures as the cause of abnormal reaction of the patient, or of later complication, without mention of misadventure at the time of the procedure: Secondary | ICD-10-CM | POA: Diagnosis present

## 2017-09-06 DIAGNOSIS — Z8249 Family history of ischemic heart disease and other diseases of the circulatory system: Secondary | ICD-10-CM | POA: Diagnosis not present

## 2017-09-06 DIAGNOSIS — E785 Hyperlipidemia, unspecified: Secondary | ICD-10-CM | POA: Diagnosis present

## 2017-09-06 DIAGNOSIS — I10 Essential (primary) hypertension: Secondary | ICD-10-CM | POA: Diagnosis present

## 2017-09-06 DIAGNOSIS — N138 Other obstructive and reflux uropathy: Secondary | ICD-10-CM | POA: Diagnosis not present

## 2017-09-06 DIAGNOSIS — R079 Chest pain, unspecified: Secondary | ICD-10-CM | POA: Diagnosis not present

## 2017-09-06 DIAGNOSIS — I25110 Atherosclerotic heart disease of native coronary artery with unstable angina pectoris: Secondary | ICD-10-CM | POA: Diagnosis not present

## 2017-09-06 DIAGNOSIS — I200 Unstable angina: Secondary | ICD-10-CM | POA: Diagnosis not present

## 2017-09-06 DIAGNOSIS — F329 Major depressive disorder, single episode, unspecified: Secondary | ICD-10-CM | POA: Diagnosis present

## 2017-09-06 DIAGNOSIS — R001 Bradycardia, unspecified: Secondary | ICD-10-CM | POA: Diagnosis present

## 2017-09-06 DIAGNOSIS — Y636 Underdosing and nonadministration of necessary drug, medicament or biological substance: Secondary | ICD-10-CM | POA: Diagnosis present

## 2017-09-06 DIAGNOSIS — Z79899 Other long term (current) drug therapy: Secondary | ICD-10-CM | POA: Diagnosis not present

## 2017-09-06 DIAGNOSIS — Z888 Allergy status to other drugs, medicaments and biological substances status: Secondary | ICD-10-CM | POA: Diagnosis not present

## 2017-09-06 DIAGNOSIS — Z923 Personal history of irradiation: Secondary | ICD-10-CM | POA: Diagnosis not present

## 2017-09-06 DIAGNOSIS — N401 Enlarged prostate with lower urinary tract symptoms: Secondary | ICD-10-CM | POA: Diagnosis not present

## 2017-09-06 DIAGNOSIS — E7800 Pure hypercholesterolemia, unspecified: Secondary | ICD-10-CM | POA: Diagnosis not present

## 2017-09-06 DIAGNOSIS — Z79890 Hormone replacement therapy (postmenopausal): Secondary | ICD-10-CM | POA: Diagnosis not present

## 2017-09-06 DIAGNOSIS — I5030 Unspecified diastolic (congestive) heart failure: Secondary | ICD-10-CM | POA: Diagnosis not present

## 2017-09-06 DIAGNOSIS — T82855A Stenosis of coronary artery stent, initial encounter: Secondary | ICD-10-CM | POA: Diagnosis not present

## 2017-09-06 DIAGNOSIS — F4310 Post-traumatic stress disorder, unspecified: Secondary | ICD-10-CM | POA: Diagnosis present

## 2017-09-06 DIAGNOSIS — E039 Hypothyroidism, unspecified: Secondary | ICD-10-CM | POA: Diagnosis present

## 2017-09-06 DIAGNOSIS — Z9079 Acquired absence of other genital organ(s): Secondary | ICD-10-CM | POA: Diagnosis not present

## 2017-09-06 DIAGNOSIS — R0789 Other chest pain: Secondary | ICD-10-CM | POA: Diagnosis present

## 2017-09-06 HISTORY — PX: LEFT HEART CATH AND CORONARY ANGIOGRAPHY: CATH118249

## 2017-09-06 HISTORY — PX: CORONARY STENT INTERVENTION: CATH118234

## 2017-09-06 LAB — PROTIME-INR
INR: 1.11
Prothrombin Time: 14.2 seconds (ref 11.4–15.2)

## 2017-09-06 LAB — POCT ACTIVATED CLOTTING TIME
Activated Clotting Time: 257 seconds
Activated Clotting Time: 373 seconds

## 2017-09-06 LAB — TROPONIN I: Troponin I: <0.03 ng/mL (ref <0.03)

## 2017-09-06 LAB — ECHOCARDIOGRAM COMPLETE
Height: 68 in
Weight: 2547.2 oz

## 2017-09-06 SURGERY — LEFT HEART CATH AND CORONARY ANGIOGRAPHY
Anesthesia: LOCAL

## 2017-09-06 MED ORDER — CLOPIDOGREL BISULFATE 75 MG PO TABS
75.0000 mg | ORAL_TABLET | Freq: Every day | ORAL | Status: DC
  Administered 2017-09-07: 75 mg via ORAL
  Filled 2017-09-06: qty 1

## 2017-09-06 MED ORDER — NITROGLYCERIN 1 MG/10 ML FOR IR/CATH LAB
INTRA_ARTERIAL | Status: AC
  Filled 2017-09-06: qty 10

## 2017-09-06 MED ORDER — ALPRAZOLAM 0.5 MG PO TABS: 1.0000 mg | ORAL_TABLET | Freq: Two times a day (BID) | ORAL | Status: DC | PRN

## 2017-09-06 MED ORDER — ANGIOPLASTY BOOK
Freq: Once | Status: AC
Start: 2017-09-06 — End: 2017-09-07
  Administered 2017-09-07: 06:00:00
  Filled 2017-09-06: qty 1

## 2017-09-06 MED ORDER — NITROGLYCERIN 0.4 MG SL SUBL: 0.4000 mg | SUBLINGUAL_TABLET | SUBLINGUAL | Status: DC | PRN

## 2017-09-06 MED ORDER — HEPARIN SODIUM (PORCINE) 1000 UNIT/ML IJ SOLN
INTRAMUSCULAR | Status: DC | PRN
  Administered 2017-09-06: 3500 [IU] via INTRAVENOUS
  Administered 2017-09-06: 6500 [IU] via INTRAVENOUS
  Administered 2017-09-06: 2000 [IU] via INTRAVENOUS

## 2017-09-06 MED ORDER — SODIUM CHLORIDE 0.9% FLUSH: 3.0000 mL | Freq: Two times a day (BID) | INTRAVENOUS | Status: DC

## 2017-09-06 MED ORDER — HEPARIN BOLUS VIA INFUSION
4000.0000 [IU] | Freq: Once | INTRAVENOUS | Status: AC
  Administered 2017-09-06: 4000 [IU] via INTRAVENOUS
  Filled 2017-09-06: qty 4000

## 2017-09-06 MED ORDER — LIDOCAINE HCL (PF) 1 % IJ SOLN
INTRAMUSCULAR | Status: AC
  Filled 2017-09-06: qty 30

## 2017-09-06 MED ORDER — MIDAZOLAM HCL 2 MG/2ML IJ SOLN
INTRAMUSCULAR | Status: AC
  Filled 2017-09-06: qty 2

## 2017-09-06 MED ORDER — SODIUM CHLORIDE 0.9 % IV SOLN: 250.0000 mL | INTRAVENOUS | Status: DC | PRN

## 2017-09-06 MED ORDER — ACETAMINOPHEN 325 MG PO TABS
650.0000 mg | ORAL_TABLET | ORAL | Status: DC | PRN
  Administered 2017-09-06: 18:00:00 650 mg via ORAL
  Filled 2017-09-06: qty 2

## 2017-09-06 MED ORDER — ACETAMINOPHEN 325 MG PO TABS
650.0000 mg | ORAL_TABLET | Freq: Four times a day (QID) | ORAL | Status: DC | PRN
  Administered 2017-09-06: 650 mg via ORAL
  Filled 2017-09-06: qty 2

## 2017-09-06 MED ORDER — SODIUM CHLORIDE 0.9 % WEIGHT BASED INFUSION
3.0000 mL/kg/h | INTRAVENOUS | Status: DC
  Administered 2017-09-06: 3 mL/kg/h via INTRAVENOUS

## 2017-09-06 MED ORDER — SODIUM CHLORIDE 0.9 % IV SOLN
INTRAVENOUS | Status: AC | PRN
  Administered 2017-09-06: 75 mL/h via INTRAVENOUS
  Administered 2017-09-06: 16:00:00

## 2017-09-06 MED ORDER — FENTANYL CITRATE (PF) 100 MCG/2ML IJ SOLN
INTRAMUSCULAR | Status: DC | PRN
  Administered 2017-09-06 (×2): 25 ug via INTRAVENOUS

## 2017-09-06 MED ORDER — ASPIRIN EC 81 MG PO TBEC
81.0000 mg | DELAYED_RELEASE_TABLET | Freq: Every day | ORAL | Status: DC
  Administered 2017-09-06 – 2017-09-07 (×2): 81 mg via ORAL
  Filled 2017-09-06 (×2): qty 1

## 2017-09-06 MED ORDER — LIDOCAINE HCL (PF) 1 % IJ SOLN
INTRAMUSCULAR | Status: DC | PRN
  Administered 2017-09-06: 2 mL

## 2017-09-06 MED ORDER — CLOPIDOGREL BISULFATE 300 MG PO TABS
ORAL_TABLET | ORAL | Status: AC
  Filled 2017-09-06: qty 1

## 2017-09-06 MED ORDER — TIROFIBAN HCL IV 12.5 MG/250 ML
0.1500 ug/kg/min | INTRAVENOUS | Status: AC
  Administered 2017-09-06: 0.15 ug/kg/min via INTRAVENOUS
  Filled 2017-09-06: qty 100
  Filled 2017-09-06: qty 250

## 2017-09-06 MED ORDER — SODIUM CHLORIDE 0.9% FLUSH: 3.0000 mL | INTRAVENOUS | Status: DC | PRN

## 2017-09-06 MED ORDER — IODIXANOL 320 MG/ML IV SOLN: INTRAVENOUS | Status: DC | PRN

## 2017-09-06 MED ORDER — SODIUM CHLORIDE 0.9 % WEIGHT BASED INFUSION: 1.0000 mL/kg/h | INTRAVENOUS | Status: DC

## 2017-09-06 MED ORDER — TIROFIBAN (AGGRASTAT) BOLUS VIA INFUSION
INTRAVENOUS | Status: DC | PRN
  Administered 2017-09-06: 1805 ug via INTRAVENOUS

## 2017-09-06 MED ORDER — SODIUM CHLORIDE 0.9 % IV SOLN
INTRAVENOUS | Status: AC
  Administered 2017-09-06: 17:00:00 via INTRAVENOUS

## 2017-09-06 MED ORDER — MIDAZOLAM HCL 2 MG/2ML IJ SOLN
INTRAMUSCULAR | Status: DC | PRN
  Administered 2017-09-06: 1 mg via INTRAVENOUS

## 2017-09-06 MED ORDER — ZOLPIDEM TARTRATE 5 MG PO TABS
5.0000 mg | ORAL_TABLET | Freq: Every evening | ORAL | Status: DC | PRN
Start: 2017-09-06 — End: 2017-09-07

## 2017-09-06 MED ORDER — HEPARIN SODIUM (PORCINE) 1000 UNIT/ML IJ SOLN
INTRAMUSCULAR | Status: AC
  Filled 2017-09-06: qty 1

## 2017-09-06 MED ORDER — HYDRALAZINE HCL 20 MG/ML IJ SOLN: 5.0000 mg | INTRAMUSCULAR | Status: AC | PRN

## 2017-09-06 MED ORDER — ROSUVASTATIN CALCIUM 20 MG PO TABS
20.0000 mg | ORAL_TABLET | Freq: Every day | ORAL | Status: DC
  Administered 2017-09-06 – 2017-09-07 (×2): 20 mg via ORAL
  Filled 2017-09-06 (×2): qty 1

## 2017-09-06 MED ORDER — TIROFIBAN HCL IV 12.5 MG/250 ML
INTRAVENOUS | Status: AC
  Filled 2017-09-06: qty 250

## 2017-09-06 MED ORDER — TIROFIBAN HCL IV 12.5 MG/250 ML
INTRAVENOUS | Status: AC | PRN
  Administered 2017-09-06: 0.15 ug/kg/min via INTRAVENOUS

## 2017-09-06 MED ORDER — LEVOTHYROXINE SODIUM 75 MCG PO TABS
75.0000 ug | ORAL_TABLET | Freq: Every day | ORAL | Status: DC
Start: 2017-09-06 — End: 2017-09-07
  Administered 2017-09-06: 75 ug via ORAL
  Filled 2017-09-06: qty 1

## 2017-09-06 MED ORDER — VERAPAMIL HCL 2.5 MG/ML IV SOLN
INTRAVENOUS | Status: AC
  Filled 2017-09-06: qty 2

## 2017-09-06 MED ORDER — HEPARIN (PORCINE) IN NACL 1000-0.9 UT/500ML-% IV SOLN
INTRAVENOUS | Status: AC
  Filled 2017-09-06: qty 1000

## 2017-09-06 MED ORDER — HEPARIN (PORCINE) IN NACL 1000-0.9 UT/500ML-% IV SOLN
INTRAVENOUS | Status: DC | PRN
  Administered 2017-09-06: 1000 mL

## 2017-09-06 MED ORDER — KETOROLAC TROMETHAMINE 30 MG/ML IJ SOLN
15.0000 mg | Freq: Once | INTRAMUSCULAR | Status: AC
  Administered 2017-09-06: 15 mg via INTRAVENOUS
  Filled 2017-09-06: qty 1

## 2017-09-06 MED ORDER — IOHEXOL 350 MG/ML SOLN
INTRAVENOUS | Status: DC | PRN
  Administered 2017-09-06: 170 mL via INTRAVENOUS

## 2017-09-06 MED ORDER — HEPARIN (PORCINE) IN NACL 100-0.45 UNIT/ML-% IJ SOLN
1000.0000 [IU]/h | INTRAMUSCULAR | Status: DC
  Administered 2017-09-06: 1000 [IU]/h via INTRAVENOUS
  Filled 2017-09-06: qty 250

## 2017-09-06 MED ORDER — SODIUM CHLORIDE 0.9% FLUSH
3.0000 mL | Freq: Two times a day (BID) | INTRAVENOUS | Status: DC
Start: 2017-09-06 — End: 2017-09-06

## 2017-09-06 MED ORDER — LABETALOL HCL 5 MG/ML IV SOLN: 10.0000 mg | INTRAVENOUS | Status: AC | PRN

## 2017-09-06 MED ORDER — FENTANYL CITRATE (PF) 100 MCG/2ML IJ SOLN
INTRAMUSCULAR | Status: AC
  Filled 2017-09-06: qty 2

## 2017-09-06 MED ORDER — CLOPIDOGREL BISULFATE 300 MG PO TABS
ORAL_TABLET | ORAL | Status: DC | PRN
  Administered 2017-09-06: 600 mg via ORAL

## 2017-09-06 MED ORDER — ASPIRIN 81 MG PO CHEW: 81.0000 mg | CHEWABLE_TABLET | ORAL | Status: DC

## 2017-09-06 SURGICAL SUPPLY — 19 items
BALLN SAPPHIRE 2.0X10 (BALLOONS) ×2
BALLN SAPPHIRE 2.5X20 (BALLOONS) ×2
BALLN ~~LOC~~ EMERGE MR 3.0X20 (BALLOONS) ×2
BALLOON SAPPHIRE 2.0X10 (BALLOONS) IMPLANT
BALLOON SAPPHIRE 2.5X20 (BALLOONS) IMPLANT
BALLOON ~~LOC~~ EMERGE MR 3.0X20 (BALLOONS) IMPLANT
CATH 5FR JL3.5 JR4 ANG PIG MP (CATHETERS) ×1 IMPLANT
CATH VISTA GUIDE 6FR XBLAD3.5 (CATHETERS) ×1 IMPLANT
DEVICE RAD COMP TR BAND LRG (VASCULAR PRODUCTS) ×1 IMPLANT
GLIDESHEATH SLEND SS 6F .021 (SHEATH) ×1 IMPLANT
GUIDEWIRE INQWIRE 1.5J.035X260 (WIRE) IMPLANT
INQWIRE 1.5J .035X260CM (WIRE) ×2
KIT HEART LEFT (KITS) ×2 IMPLANT
PACK CARDIAC CATHETERIZATION (CUSTOM PROCEDURE TRAY) ×2 IMPLANT
STENT SYNERGY DES 2.75X38 (Permanent Stent) ×1 IMPLANT
SYR MEDRAD MARK V 150ML (SYRINGE) ×2 IMPLANT
TRANSDUCER W/STOPCOCK (MISCELLANEOUS) ×2 IMPLANT
TUBING CIL FLEX 10 FLL-RA (TUBING) ×2 IMPLANT
WIRE COUGAR XT STRL 190CM (WIRE) ×1 IMPLANT

## 2017-09-06 NOTE — Progress Notes (Signed)
*  PRELIMINARY RESULTS* Echocardiogram 2D Echocardiogram has been performed.  Keith Olson 09/06/2017, 2:31 PM

## 2017-09-06 NOTE — Progress Notes (Signed)
Pt transferring to [facility] per Dr. Harl Bowie. Pt's VSS. Alert and oriented. IV site WDL, medication/fluids infusing. Pt and family updated at bedside. Verbalized understanding of plan of care. Report given to both [facility] and Coralyn Mark, Therapist, sports at [facility]. Verbalized understanding. Pt left floor via stretcher in stable condition accompanied by [facility].

## 2017-09-06 NOTE — Progress Notes (Signed)
ANTICOAGULATION CONSULT NOTE - Initial Consult  Pharmacy Consult for Heparin Indication: chest pain/ACS  Allergies  Allergen Reactions  . Lipitor [Atorvastatin]     Muscles aches   Patient Measurements: Height: 5\' 8"  (172.7 cm) Weight: 159 lb 3.2 oz (72.2 kg) IBW/kg (Calculated) : 68.4 HEPARIN DW (KG): 72.2   Vital Signs: Temp: 97.5 F (36.4 C) (07/09 0548) Temp Source: Oral (07/09 0548) BP: 105/66 (07/09 0548) Pulse Rate: 43 (07/09 0548)  Labs: Recent Labs    09/05/17 1840 09/05/17 2302 09/06/17 0524  HGB 13.5  --   --   HCT 39.8  --   --   PLT 245  --   --   CREATININE 0.92  --   --   TROPONINI <0.03 <0.03 <0.03   Estimated Creatinine Clearance: 67.1 mL/min (by C-G formula based on SCr of 0.92 mg/dL).  Medical History: Past Medical History:  Diagnosis Date  . Agent orange exposure   . Arthritis   . Coronary artery disease    BMS to mid LAD 2001, DES to proximal LAD 2017  . Enlarged prostate    XRT 2016  . Headache    Sinus headaches   . History of depression   . Hyperlipidemia   . Hypothyroidism   . Prostate cancer ([place]) 07/2014   hormonal therapy, external beam radiation therapy  . PTSD (post-traumatic stress disorder)    Medications:  Scheduled:  . aspirin EC  81 mg Oral Daily  . clopidogrel  75 mg Oral Daily  . famotidine  20 mg Oral Daily  . ketorolac  15 mg Intravenous Once  . levothyroxine  75 mcg Oral QAC breakfast  . rosuvastatin  20 mg Oral Daily   Assessment: Okay for Protocol, No bleeding noted.  No beta-blocker currently due to baseline bradycardia.  Goal of Therapy:  Heparin level 0.3-0.7 units/ml Monitor platelets by anticoagulation protocol: Yes   Plan:  Give 4000 units bolus x 1 Start heparin infusion at 1000 units/hr Check anti-Xa level in 6-8 hours and daily while on heparin Continue to monitor H&H and platelets  Pricilla Larsson 09/06/2017,9:15 AM

## 2017-09-06 NOTE — H&P (View-Only) (Signed)
Cardiology Consult    Patient ID: Byran Bilotti.; [id number]; [birth year]   Admit date: 09/05/2017 Date of Consult: 09/06/2017  Primary Care Provider: Dettinger, Fransisca Kaufmann, MD Primary Cardiologist: Rozann Lesches, MD   Patient Profile    Keith Olson. is a 77 y.o. male with past medical history of CAD (s/p BMS to LAD in 2001, DES to proximal LAD in 11/2015, patent stents by repeat cath in 08/2016), HTN, HLD, Hypothyroidism, and agent orange exposure who is being seen today for the evaluation of chest pain at the request of Dr. Olevia Bowens.   History of Present Illness    Mr. Champney was last evaluated by Dr. Domenic Polite in 03/2017 and reported having intermittent episodes of chest discomfort which had overall been stable over the past several months. Given his recent cardiac catheterization showed patent stents with moderate disease along the RPDA, continued medical management was recommended.   He called the office on 09/05/2017 reporting episodes of chest discomfort and dyspnea over the past few weeks, therefore he was advised to go to the ED for further evaluation. In talking with the patient and his wife today, he reports starting to have worsening chest discomfort approximately 2 weeks ago which he describes as a pressure along his sternum. Reports he had been working in his shop and the pain started all at once with associated discomfort down his left arm. He sat down and symptoms resolved within 20 minutes. Since then, he has reported worsening episodes of chest discomfort and dyspnea on exertion which is occurring with activity. Now notices this when walking up the hill in his backyard. Reports symptoms are similar to when he required stent placement in the past. He initially equated some of his chest discomfort to acid reflux but has been taking Tums with no improvement in his symptoms. He does not utilize sublingual nitroglycerin regularly due to this causing frequent headaches. He denies any  recent orthopnea, PND, lower extremity edema, or palpitations. No recent travel.  Initial labs show WBC 7.1, Hgb 13.5, platelets 245, Na+ 135, K+ 4.0, and creatinine 0.92.  BNP at 16. TSH 5.877. Initial and cyclic troponin values have been negative. CXR shows no active cardiopulmonary disease. EKG shows NSR, HR 62, with LAD, and no acute ST changes when compared to prior tracings.    Past Medical History:  Diagnosis Date  . Agent orange exposure   . Arthritis   . Coronary artery disease    BMS to mid LAD 2001, DES to proximal LAD 2017  . Enlarged prostate    XRT 2016  . Headache    Sinus headaches   . History of depression   . Hyperlipidemia   . Hypothyroidism   . Prostate cancer ([place]) 07/2014   hormonal therapy, external beam radiation therapy  . PTSD (post-traumatic stress disorder)     Past Surgical History:  Procedure Laterality Date  . CARDIAC CATHETERIZATION N/A 12/12/2015   Procedure: Left Heart Cath and Coronary Angiography;  Surgeon: Peter M Martinique, MD;  Location: [facility];  Service: Cardiovascular;  Laterality: N/A;  . CARDIAC CATHETERIZATION N/A 12/12/2015   Procedure: Coronary Stent Intervention;  Surgeon: Peter M Martinique, MD;  Location: [facility];  Service: Cardiovascular;  Laterality: N/A;  . HERNIA REPAIR Right   . LEFT HEART CATH AND CORONARY ANGIOGRAPHY N/A 09/02/2016   Procedure: Left Heart Cath and Coronary Angiography;  Surgeon: Leonie Man, MD;  Location: [facility];  Service: Cardiovascular;  Laterality: N/A;  . PROSTATE BIOPSY N/A 08/28/2014   Procedure: BIOPSY TRANSRECTAL ULTRASONIC PROSTATE (TUBP);  Surgeon: Rana Snare, MD;  Location: [facility];  Service: Urology;  Laterality: N/A;  . TRANSURETHRAL RESECTION OF PROSTATE N/A 08/28/2014   Procedure: TRANSURETHRAL RESECTION OF THE PROSTATE WITH GYRUS INSTRUMENTS;  Surgeon: Rana Snare, MD;  Location: [facility];  Service: Urology;  Laterality: N/A;     Home Medications:  Prior to  Admission medications   Medication Sig Start Date End Date Taking? Authorizing Provider  aspirin EC 81 MG tablet Take 1 tablet (81 mg total) by mouth daily. 12/09/15  Yes Satira Sark, MD  clopidogrel (PLAVIX) 75 MG tablet Take 1 tablet (75 mg total) by mouth daily. 01/08/16  Yes Satira Sark, MD  nitroGLYCERIN (NITROSTAT) 0.4 MG SL tablet Place 0.4 mg under the tongue every 5 (five) minutes as needed for chest pain. X 3 doses   Yes [provider]  levothyroxine (SYNTHROID, LEVOTHROID) 75 MCG tablet Take 75 mcg by mouth daily before breakfast.    [provider]  rosuvastatin (CRESTOR) 20 MG tablet Take 1 tablet (20 mg total) by mouth daily. Patient not taking: Reported on 09/05/2017 12/12/15   Barrett, Evelene Croon, PA-C  sulfamethoxazole-trimethoprim (BACTRIM DS) 800-160 MG tablet Take 1 tablet by mouth 2 (two) times daily. Patient not taking: Reported on 09/05/2017 08/26/17   Chevis Pretty, FNP    Inpatient Medications: Scheduled Meds: . aspirin EC  81 mg Oral Daily  . clopidogrel  75 mg Oral Daily  . enoxaparin (LOVENOX) injection  40 mg Subcutaneous Q24H  . famotidine  20 mg Oral Daily  . levothyroxine  75 mcg Oral QAC breakfast  . rosuvastatin  20 mg Oral Daily   Continuous Infusions:  PRN Meds: nitroGLYCERIN  Allergies:    Allergies  Allergen Reactions  . Lipitor [Atorvastatin]     Muscles aches    Social History:   Social History   Socioeconomic History  . Marital status: Married    Spouse name: Not on file  . Number of children: Not on file  . Years of education: Not on file  . Highest education level: Not on file  Occupational History  . Occupation: retired  Scientific laboratory technician  . Financial resource strain: Not on file  . Food insecurity:    Worry: Not on file    Inability: Not on file  . Transportation needs:    Medical: Not on file    Non-medical: Not on file  Tobacco Use  . Smoking status: Never Smoker  . Smokeless tobacco:  Former Systems developer    Types: Chew  Substance and Sexual Activity  . Alcohol use: No  . Drug use: No  . Sexual activity: Yes  Lifestyle  . Physical activity:    Days per week: Not on file    Minutes per session: Not on file  . Stress: Not on file  Relationships  . Social connections:    Talks on phone: Not on file    Gets together: Not on file    Attends religious service: Not on file    Active member of club or organization: Not on file    Attends meetings of clubs or organizations: Not on file    Relationship status: Not on file  . Intimate partner violence:    Fear of current or ex partner: Not on file    Emotionally abused: Not on file    Physically abused: Not on file  Forced sexual activity: Not on file  Other Topics Concern  . Not on file  Social History Narrative   Married   5 children     Family History:    Family History  Problem Relation Age of Onset  . Arthritis Mother   . Heart attack Father       Review of Systems    General:  No chills, fever, night sweats or weight changes.  Cardiovascular:  No edema, orthopnea, palpitations, paroxysmal nocturnal dyspnea. Positive for chest pain and dyspnea on exertion.  Dermatological: No rash, lesions/masses Respiratory: No cough, dyspnea Urologic: No hematuria, dysuria Abdominal:   No nausea, vomiting, diarrhea, bright red blood per rectum, melena, or hematemesis Neurologic:  No visual changes, wkns, changes in mental status. All other systems reviewed and are otherwise negative except as noted above.  Physical Exam/Data    Vitals:   09/05/17 2000 09/05/17 2015 09/05/17 2124 09/06/17 0548  BP: 118/67  (!) 145/81 105/66  Pulse: (!) 51 (!) 51 (!) 50 (!) 43  Resp: 15 14 16    Temp:   97.8 F (36.6 C) (!) 97.5 F (36.4 C)  TempSrc:   Oral Oral  SpO2: 96% 96% 98% 99%  Weight:   159 lb 3.2 oz (72.2 kg)   Height:   5\' 8"  (1.727 m)     Intake/Output Summary (Last 24 hours) at 09/06/2017 0737 Last data filed at  09/06/2017 0000 Gross per 24 hour  Intake 360 ml  Output -  Net 360 ml   Filed Weights   09/05/17 1750 09/05/17 2124  Weight: 164 lb (74.4 kg) 159 lb 3.2 oz (72.2 kg)   Body mass index is 24.21 kg/m.   General: Pleasant, Caucasian male appearing in NAD Psych: Normal affect. Neuro: Alert and oriented X 3. Moves all extremities spontaneously. HEENT: Normal  Neck: Supple without bruits or JVD. Lungs:  Resp regular and unlabored, CTA without wheezing or rales. Heart: RRR no s3, s4, or murmurs. Abdomen: Soft, non-tender, non-distended, BS + x 4.  Extremities: No clubbing, cyanosis or edema. DP/PT/Radials 2+ and equal bilaterally.   EKG:  The EKG was personally reviewed and demonstrates: NSR, HR 62, with LAD, and no acute ST changes when compared to prior tracings.    Telemetry:  Telemetry was personally reviewed and demonstrates: Sinus bradycardia, HR in 40's to 50's.    Labs/Studies     Relevant CV Studies:  Cardiac Catheterization: 09/02/2016  Ost RCA lesion, 45 %stenosed.  RPDA lesion, 60 %stenosed. Stable  Prox LAD DES stent, 20 %stenosed. - Overlaps Mid LAD BMS stent, 20 %stenosed.  The left ventricular systolic function is normal. The left ventricular ejection fraction is 55-65% by visual estimate.  LV end diastolic pressure is normal.   Angiographically only moderate disease in the ostial RCA and RPDA noted. No significant change from 2017 to explain sudden onset of progressive angina.  He was quite hypertensive during the procedure which would suggest possible hypertensive heart disease and may be microvascular ischemia, however there is nothing to explain resting chest pain/pressure.  If symptoms were to persist, could consider noninvasive stress testing to evaluate the PDA lesion, however angiographically did not appear to be any different than last cath.   Plan: Return to Short Stay unit for TR band removal. Anticipate discharge later today.  Defer  management to PCP and cardiologist   Laboratory Data:  Chemistry Recent Labs  Lab 09/05/17 1840  NA 135  K 4.0  CL 105  CO2  23  GLUCOSE 98  BUN 14  CREATININE 0.92  CALCIUM 8.6*  GFRNONAA >60  GFRAA >60  ANIONGAP 7    Recent Labs  Lab 09/05/17 1840  PROT 7.2  ALBUMIN 4.3  AST 23  ALT 24  ALKPHOS 69  BILITOT 0.5   Hematology Recent Labs  Lab 09/05/17 1840  WBC 7.1  RBC 4.39  HGB 13.5  HCT 39.8  MCV 90.7  MCH 30.8  MCHC 33.9  RDW 13.4  PLT 245   Cardiac Enzymes Recent Labs  Lab 09/05/17 1840 09/05/17 2302 09/06/17 0524  TROPONINI <0.03 <0.03 <0.03   No results for input(s): TROPIPOC in the last 168 hours.  BNP Recent Labs  Lab 09/05/17 1840  BNP 16.0    DDimer No results for input(s): DDIMER in the last 168 hours.  Radiology/Studies:  Dg Chest 2 View  Result Date: 09/05/2017 CLINICAL DATA:  Chest pain and shortness of breath. EXAM: CHEST - 2 VIEW COMPARISON:  None FINDINGS: Atherosclerotic calcifications noted within the thoracic aorta. Lad coronary artery stent noted. The heart size and mediastinal contours are within normal limits. Both lungs are clear. The visualized skeletal structures are unremarkable. IMPRESSION: No active cardiopulmonary disease. Aortic Atherosclerosis (ICD10-I70.0). Electronically Signed   By: Kerby Moors M.D.   On: 09/05/2017 18:24     Assessment & Plan    1. Chest Pain concerning for UnstableAngina - The patient has a history of known CAD as outlined above and presents with symptoms of worsening chest discomfort and dyspnea on exertion starting approximately 2 weeks ago. He is now getting short of breath and having chest discomfort when ambulating around his home or in his yard which was not previously an issue. Reports symptoms are identical to when he required stent placement in the past. Denies any recent orthopnea, PND, lower extremity edema, or palpitations. No recent travel.  - Initial and cyclic troponin values  have been negative. EKG shows NSR, HR 62, with LAD, and no acute ST changes when compared to prior tracings.  - will discuss further with Dr. Harl Bowie, but given his presenting symptoms will likely need a repeat cardiac catheterization for definitive evaluation. He did consume a fruit bar at 0700 but breakfast has been held. Continue ASA, Plavix, and statin. Not on BB therapy given baseline bradycardia. Consider addition of Imdur or low-dose Amlodipine prior to discharge for anti-anginal benefit.   2. CAD - s/p BMS to LAD in 2001 and DES to proximal LAD in 11/2015 with patent stents by repeat cath in 08/2016. Moderate disease along the RPDA noted at that time with continued medical management recommended.  - continue ASA, Plavix, and statin therapy.   3. HTN - BP has overall been well controlled at 105/66 - 145/81 since admission. - not on any anti-hypertensive medications PTA.   4. HLD - Followed by PCP. Goal LDL is less than 70 in the setting of known CAD. Remains on Crestor 20 mg daily.  5. Hypothyroidism - TSH slightly elevated to 5.877 on admission. Will check Free T4.  - continue Synthroid 75 mcg daily.    For questions or updates, please contact [facility] Please consult [email or web address] for contact info under Cardiology/STEMI.  Signed, Erma Heritage, PA-C 09/06/2017, 7:37 AM Pager: [phone]  Attending note Patient seen and discussed with PA Ahmed Prima, I agree with her documentation above. 77 yo male history of CAD with prior stenting as reported above, last cath 08/2016 with patent stent and modrate RPDA disease.  Presents with chest pain. Main episode occurred 3 weeks ago, severe 9/10 aching pain mid chest radiating down left arm, just waited out symptoms. Since that time significant exertional dyspnea at just 50 feet, intermittent exertional chest pain. Thought he has been having some heart burn, taking extra tums and not improved.   K 4 Cr 0.92 WBC 7.1 Hgb 13.5 plt 245  BNP 16 Mg 2.4 Phos 3.1 TSH 5.9  Trop neg x 3 CXR no acute process EKG SR, no acute ischemic changes.   Symptoms concerning for possible unstable angina in patient with known CAD. Pretest probability for obstructive disease too high for noninvasive testing, will plan for cath, transfer today.   Carlyle Dolly MD

## 2017-09-06 NOTE — Interval H&P Note (Signed)
History and Physical Interval Note:  09/06/2017 3:00 PM  Keith Olson.  has presented today for cardiac cath with the diagnosis of CAD with unstable angina  The various methods of treatment have been discussed with the patient and family. After consideration of risks, benefits and other options for treatment, the patient has consented to  Procedure(s): LEFT HEART CATH AND CORONARY ANGIOGRAPHY (N/A) as a surgical intervention .  The patient's history has been reviewed, patient examined, no change in status, stable for surgery.  I have reviewed the patient's chart and labs.  Questions were answered to the patient's satisfaction.    Cath Lab Visit (complete for each Cath Lab visit)  Clinical Evaluation Leading to the Procedure:   ACS: No.  Non-ACS:    Anginal Classification: CCS III  Anti-ischemic medical therapy: No Therapy  Non-Invasive Test Results: No non-invasive testing performed  Prior CABG: No previous CABG         Lauree Chandler

## 2017-09-06 NOTE — Consult Note (Addendum)
Cardiology Consult    Patient ID: Keith Olson.; [id number]; 05/20/1940   Admit date: 09/05/2017 Date of Consult: 09/06/2017  Primary Care Provider: Dettinger, Fransisca Kaufmann, MD Primary Cardiologist: Rozann Lesches, MD   Patient Profile    Keith Olson. is a 77 y.o. male with past medical history of CAD (s/p BMS to LAD in 2001, DES to proximal LAD in 11/2015, patent stents by repeat cath in 08/2016), HTN, HLD, Hypothyroidism, and agent orange exposure who is being seen today for the evaluation of chest pain at the request of Dr. Olevia Bowens.   History of Present Illness    [name] was last evaluated by Dr. Domenic Polite in 03/2017 and reported having intermittent episodes of chest discomfort which had overall been stable over the past several months. Given his recent cardiac catheterization showed patent stents with moderate disease along the RPDA, continued medical management was recommended.   He called the office on 09/05/2017 reporting episodes of chest discomfort and dyspnea over the past few weeks, therefore he was advised to go to the ED for further evaluation. In talking with the patient and his wife today, he reports starting to have worsening chest discomfort approximately 2 weeks ago which he describes as a pressure along his sternum. Reports he had been working in his shop and the pain started all at once with associated discomfort down his left arm. He sat down and symptoms resolved within 20 minutes. Since then, he has reported worsening episodes of chest discomfort and dyspnea on exertion which is occurring with activity. Now notices this when walking up the hill in his backyard. Reports symptoms are similar to when he required stent placement in the past. He initially equated some of his chest discomfort to acid reflux but has been taking Tums with no improvement in his symptoms. He does not utilize sublingual nitroglycerin regularly due to this causing frequent headaches. He denies any  recent orthopnea, PND, lower extremity edema, or palpitations. No recent travel.  Initial labs show WBC 7.1, Hgb 13.5, platelets 245, Na+ 135, K+ 4.0, and creatinine 0.92.  BNP at 16. TSH 5.877. Initial and cyclic troponin values have been negative. CXR shows no active cardiopulmonary disease. EKG shows NSR, HR 62, with LAD, and no acute ST changes when compared to prior tracings.    Past Medical History:  Diagnosis Date  . Agent orange exposure   . Arthritis   . Coronary artery disease    BMS to mid LAD 2001, DES to proximal LAD 2017  . Enlarged prostate    XRT 2016  . Headache    Sinus headaches   . History of depression   . Hyperlipidemia   . Hypothyroidism   . Prostate cancer ([place]) 07/2014   hormonal therapy, external beam radiation therapy  . PTSD (post-traumatic stress disorder)     Past Surgical History:  Procedure Laterality Date  . CARDIAC CATHETERIZATION N/A 12/12/2015   Procedure: Left Heart Cath and Coronary Angiography;  Surgeon: Peter M Martinique, MD;  Location: [facility];  Service: Cardiovascular;  Laterality: N/A;  . CARDIAC CATHETERIZATION N/A 12/12/2015   Procedure: Coronary Stent Intervention;  Surgeon: Peter M Martinique, MD;  Location: [facility];  Service: Cardiovascular;  Laterality: N/A;  . HERNIA REPAIR Right   . LEFT HEART CATH AND CORONARY ANGIOGRAPHY N/A 09/02/2016   Procedure: Left Heart Cath and Coronary Angiography;  Surgeon: Leonie Man, MD;  Location: [facility];  Service: Cardiovascular;  Laterality: N/A;  . PROSTATE BIOPSY N/A 08/28/2014   Procedure: BIOPSY TRANSRECTAL ULTRASONIC PROSTATE (TUBP);  Surgeon: Rana Snare, MD;  Location: [facility];  Service: Urology;  Laterality: N/A;  . TRANSURETHRAL RESECTION OF PROSTATE N/A 08/28/2014   Procedure: TRANSURETHRAL RESECTION OF THE PROSTATE WITH GYRUS INSTRUMENTS;  Surgeon: Rana Snare, MD;  Location: [facility];  Service: Urology;  Laterality: N/A;     Home Medications:  Prior to  Admission medications   Medication Sig Start Date End Date Taking? Authorizing Provider  aspirin EC 81 MG tablet Take 1 tablet (81 mg total) by mouth daily. 12/09/15  Yes Satira Sark, MD  clopidogrel (PLAVIX) 75 MG tablet Take 1 tablet (75 mg total) by mouth daily. 01/08/16  Yes Satira Sark, MD  nitroGLYCERIN (NITROSTAT) 0.4 MG SL tablet Place 0.4 mg under the tongue every 5 (five) minutes as needed for chest pain. X 3 doses   Yes [provider]  levothyroxine (SYNTHROID, LEVOTHROID) 75 MCG tablet Take 75 mcg by mouth daily before breakfast.    [provider]  rosuvastatin (CRESTOR) 20 MG tablet Take 1 tablet (20 mg total) by mouth daily. Patient not taking: Reported on 09/05/2017 12/12/15   Barrett, Evelene Croon, PA-C  sulfamethoxazole-trimethoprim (BACTRIM DS) 800-160 MG tablet Take 1 tablet by mouth 2 (two) times daily. Patient not taking: Reported on 09/05/2017 08/26/17   Chevis Pretty, FNP    Inpatient Medications: Scheduled Meds: . aspirin EC  81 mg Oral Daily  . clopidogrel  75 mg Oral Daily  . enoxaparin (LOVENOX) injection  40 mg Subcutaneous Q24H  . famotidine  20 mg Oral Daily  . levothyroxine  75 mcg Oral QAC breakfast  . rosuvastatin  20 mg Oral Daily   Continuous Infusions:  PRN Meds: nitroGLYCERIN  Allergies:    Allergies  Allergen Reactions  . Lipitor [Atorvastatin]     Muscles aches    Social History:   Social History   Socioeconomic History  . Marital status: Married    Spouse name: Not on file  . Number of children: Not on file  . Years of education: Not on file  . Highest education level: Not on file  Occupational History  . Occupation: retired  Scientific laboratory technician  . Financial resource strain: Not on file  . Food insecurity:    Worry: Not on file    Inability: Not on file  . Transportation needs:    Medical: Not on file    Non-medical: Not on file  Tobacco Use  . Smoking status: Never Smoker  . Smokeless tobacco:  Former Systems developer    Types: Chew  Substance and Sexual Activity  . Alcohol use: No  . Drug use: No  . Sexual activity: Yes  Lifestyle  . Physical activity:    Days per week: Not on file    Minutes per session: Not on file  . Stress: Not on file  Relationships  . Social connections:    Talks on phone: Not on file    Gets together: Not on file    Attends religious service: Not on file    Active member of club or organization: Not on file    Attends meetings of clubs or organizations: Not on file    Relationship status: Not on file  . Intimate partner violence:    Fear of current or ex partner: Not on file    Emotionally abused: Not on file    Physically abused: Not on file  Forced sexual activity: Not on file  Other Topics Concern  . Not on file  Social History Narrative   Married   5 children     Family History:    Family History  Problem Relation Age of Onset  . Arthritis Mother   . Heart attack Father       Review of Systems    General:  No chills, fever, night sweats or weight changes.  Cardiovascular:  No edema, orthopnea, palpitations, paroxysmal nocturnal dyspnea. Positive for chest pain and dyspnea on exertion.  Dermatological: No rash, lesions/masses Respiratory: No cough, dyspnea Urologic: No hematuria, dysuria Abdominal:   No nausea, vomiting, diarrhea, bright red blood per rectum, melena, or hematemesis Neurologic:  No visual changes, wkns, changes in mental status. All other systems reviewed and are otherwise negative except as noted above.  Physical Exam/Data    Vitals:   09/05/17 2000 09/05/17 2015 09/05/17 2124 09/06/17 0548  BP: 118/67  (!) 145/81 105/66  Pulse: (!) 51 (!) 51 (!) 50 (!) 43  Resp: 15 14 16    Temp:   97.8 F (36.6 C) (!) 97.5 F (36.4 C)  TempSrc:   Oral Oral  SpO2: 96% 96% 98% 99%  Weight:   159 lb 3.2 oz (72.2 kg)   Height:   5\' 8"  (1.727 m)     Intake/Output Summary (Last 24 hours) at 09/06/2017 0737 Last data filed at  09/06/2017 0000 Gross per 24 hour  Intake 360 ml  Output -  Net 360 ml   Filed Weights   09/05/17 1750 09/05/17 2124  Weight: 164 lb (74.4 kg) 159 lb 3.2 oz (72.2 kg)   Body mass index is 24.21 kg/m.   General: Pleasant, Caucasian male appearing in NAD Psych: Normal affect. Neuro: Alert and oriented X 3. Moves all extremities spontaneously. HEENT: Normal  Neck: Supple without bruits or JVD. Lungs:  Resp regular and unlabored, CTA without wheezing or rales. Heart: RRR no s3, s4, or murmurs. Abdomen: Soft, non-tender, non-distended, BS + x 4.  Extremities: No clubbing, cyanosis or edema. DP/PT/Radials 2+ and equal bilaterally.   EKG:  The EKG was personally reviewed and demonstrates: NSR, HR 62, with LAD, and no acute ST changes when compared to prior tracings.    Telemetry:  Telemetry was personally reviewed and demonstrates: Sinus bradycardia, HR in 40's to 50's.    Labs/Studies     Relevant CV Studies:  Cardiac Catheterization: 09/02/2016  Ost RCA lesion, 45 %stenosed.  RPDA lesion, 60 %stenosed. Stable  Prox LAD DES stent, 20 %stenosed. - Overlaps Mid LAD BMS stent, 20 %stenosed.  The left ventricular systolic function is normal. The left ventricular ejection fraction is 55-65% by visual estimate.  LV end diastolic pressure is normal.   Angiographically only moderate disease in the ostial RCA and RPDA noted. No significant change from 2017 to explain sudden onset of progressive angina.  He was quite hypertensive during the procedure which would suggest possible hypertensive heart disease and may be microvascular ischemia, however there is nothing to explain resting chest pain/pressure.  If symptoms were to persist, could consider noninvasive stress testing to evaluate the PDA lesion, however angiographically did not appear to be any different than last cath.   Plan: Return to Short Stay unit for TR band removal. Anticipate discharge later today.  Defer  management to PCP and cardiologist   Laboratory Data:  Chemistry Recent Labs  Lab 09/05/17 1840  NA 135  K 4.0  CL 105  CO2  23  GLUCOSE 98  BUN 14  CREATININE 0.92  CALCIUM 8.6*  GFRNONAA >60  GFRAA >60  ANIONGAP 7    Recent Labs  Lab 09/05/17 1840  PROT 7.2  ALBUMIN 4.3  AST 23  ALT 24  ALKPHOS 69  BILITOT 0.5   Hematology Recent Labs  Lab 09/05/17 1840  WBC 7.1  RBC 4.39  HGB 13.5  HCT 39.8  MCV 90.7  MCH 30.8  MCHC 33.9  RDW 13.4  PLT 245   Cardiac Enzymes Recent Labs  Lab 09/05/17 1840 09/05/17 2302 09/06/17 0524  TROPONINI <0.03 <0.03 <0.03   No results for input(s): TROPIPOC in the last 168 hours.  BNP Recent Labs  Lab 09/05/17 1840  BNP 16.0    DDimer No results for input(s): DDIMER in the last 168 hours.  Radiology/Studies:  Dg Chest 2 View  Result Date: 09/05/2017 CLINICAL DATA:  Chest pain and shortness of breath. EXAM: CHEST - 2 VIEW COMPARISON:  None FINDINGS: Atherosclerotic calcifications noted within the thoracic aorta. Lad coronary artery stent noted. The heart size and mediastinal contours are within normal limits. Both lungs are clear. The visualized skeletal structures are unremarkable. IMPRESSION: No active cardiopulmonary disease. Aortic Atherosclerosis (ICD10-I70.0). Electronically Signed   By: Kerby Moors M.D.   On: 09/05/2017 18:24     Assessment & Plan    1. Chest Pain concerning for UnstableAngina - The patient has a history of known CAD as outlined above and presents with symptoms of worsening chest discomfort and dyspnea on exertion starting approximately 2 weeks ago. He is now getting short of breath and having chest discomfort when ambulating around his home or in his yard which was not previously an issue. Reports symptoms are identical to when he required stent placement in the past. Denies any recent orthopnea, PND, lower extremity edema, or palpitations. No recent travel.  - Initial and cyclic troponin values  have been negative. EKG shows NSR, HR 62, with LAD, and no acute ST changes when compared to prior tracings.  - will discuss further with Dr. Harl Bowie, but given his presenting symptoms will likely need a repeat cardiac catheterization for definitive evaluation. He did consume a fruit bar at 0700 but breakfast has been held. Continue ASA, Plavix, and statin. Not on BB therapy given baseline bradycardia. Consider addition of Imdur or low-dose Amlodipine prior to discharge for anti-anginal benefit.   2. CAD - s/p BMS to LAD in 2001 and DES to proximal LAD in 11/2015 with patent stents by repeat cath in 08/2016. Moderate disease along the RPDA noted at that time with continued medical management recommended.  - continue ASA, Plavix, and statin therapy.   3. HTN - BP has overall been well controlled at 105/66 - 145/81 since admission. - not on any anti-hypertensive medications PTA.   4. HLD - Followed by PCP. Goal LDL is less than 70 in the setting of known CAD. Remains on Crestor 20 mg daily.  5. Hypothyroidism - TSH slightly elevated to 5.877 on admission. Will check Free T4.  - continue Synthroid 75 mcg daily.    For questions or updates, please contact [place] Please consult [email or web address] for contact info under Cardiology/STEMI.  Signed, Erma Heritage, PA-C 09/06/2017, 7:37 AM Pager: [phone]  Attending note Patient seen and discussed with PA Ahmed Prima, I agree with her documentation above. 78 yo male history of CAD with prior stenting as reported above, last cath 08/2016 with patent stent and modrate RPDA disease.  Presents with chest pain. Main episode occurred 3 weeks ago, severe 9/10 aching pain mid chest radiating down left arm, just waited out symptoms. Since that time significant exertional dyspnea at just 50 feet, intermittent exertional chest pain. Thought he has been having some heart burn, taking extra tums and not improved.   K 4 Cr 0.92 WBC 7.1 Hgb 13.5 plt 245  BNP 16 Mg 2.4 Phos 3.1 TSH 5.9  Trop neg x 3 CXR no acute process EKG SR, no acute ischemic changes.   Symptoms concerning for possible unstable angina in patient with known CAD. Pretest probability for obstructive disease too high for noninvasive testing, will plan for cath, transfer today.   Carlyle Dolly MD

## 2017-09-06 NOTE — Progress Notes (Signed)
PROGRESS NOTE    [name].  TMH:[id number] DOB: 09/13/1940 DOA: 09/05/2017 PCP: Dettinger, Fransisca Kaufmann, MD   Brief Narrative:   Keith Olson. is a 77 y.o. male with medical history significant of agent orange exposure, osteoarthritis, enlarged prostate, prostate cancer, history of sinus headaches, depression, anxiety, history of PTSD, hyperlipidemia, hypothyroidism, CAD, history of 2 stent placements who is coming to the emergency department with complaints of progressively worse dyspnea on exertion for the past 3 weeks weeks associated with previous sternal chest pressure, palpitations and nausea, which gets relief with rest.  He mentions that he ran out of his Plavix about a month ago.  He has continued taking daily aspirin.  He has been admitted with unstable angina and has been seen by cardiology with plans to transfer to [facility] for cardiac catheterization.  Assessment & Plan:   Principal Problem:   Chest pain Active Problems:   Hyperlipidemia   BPH (benign prostatic hyperplasia)   CAD (coronary artery disease)   Hypothyroidism   1. Chest pain concerning for unstable angina in setting of known CAD.  Transfer for cardiac catheterization per cardiology to [facility] and patient will be admitted to cardiology service.  Troponin and EKG findings currently negative. 2. Headache.  Appears to be either sinus versus tension headache.  Patient did not receive nitroglycerin here.  Will try Tylenol and Toradol for pain relief. 3. CAD with prior stents to LAD.  Continue aspirin, Plavix, and statin therapy. 4. Hypertension.  Well controlled. 5. Dyslipidemia.  Crestor 20 mg daily. 6. Hypothyroidism.  TSH 5.877.  T4 to be assessed by cardiology.  Continue Synthroid 75 mcg daily.   DVT prophylaxis: Lovenox Code Status: Full Family Communication: Family at bedside Disposition Plan: Transfer to [facility] for cardiac catheterization   Consultants:   Cardiology  Procedures:    None  Antimicrobials:   None   Subjective: Patient seen and evaluated today with no new acute complaints or concerns. No acute concerns or events noted overnight.  He does have complaints of headache this morning that is mostly periorbital.  He had some headache last night which was relieved with Toradol.  Objective: Vitals:   09/05/17 2015 09/05/17 2124 09/06/17 0548 09/06/17 0849  BP:  (!) 145/81 105/66   Pulse: (!) 51 (!) 50 (!) 43   Resp: 14 16    Temp:  97.8 F (36.6 C) (!) 97.5 F (36.4 C)   TempSrc:  Oral Oral   SpO2: 96% 98% 99% 94%  Weight:  72.2 kg (159 lb 3.2 oz)    Height:  5\' 8"  (1.727 m)      Intake/Output Summary (Last 24 hours) at 09/06/2017 1008 Last data filed at 09/06/2017 0000 Gross per 24 hour  Intake 360 ml  Output -  Net 360 ml   Filed Weights   09/05/17 1750 09/05/17 2124  Weight: 74.4 kg (164 lb) 72.2 kg (159 lb 3.2 oz)    Examination:  General exam: Appears calm and comfortable  Respiratory system: Clear to auscultation. Respiratory effort normal. Cardiovascular system: S1 & S2 heard, RRR. No JVD, murmurs, rubs, gallops or clicks. No pedal edema. Gastrointestinal system: Abdomen is nondistended, soft and nontender. No organomegaly or masses felt. Normal bowel sounds heard. Central nervous system: Alert and oriented. No focal neurological deficits. Extremities: Symmetric 5 x 5 power. Skin: No rashes, lesions or ulcers Psychiatry: Judgement and insight appear normal. Mood & affect appropriate.     Data Reviewed: I have personally  reviewed following labs and imaging studies  CBC: Recent Labs  Lab 09/05/17 1840  WBC 7.1  NEUTROABS 2.9  HGB 13.5  HCT 39.8  MCV 90.7  PLT 973   Basic Metabolic Panel: Recent Labs  Lab 09/05/17 1840  NA 135  K 4.0  CL 105  CO2 23  GLUCOSE 98  BUN 14  CREATININE 0.92  CALCIUM 8.6*  MG 2.4  PHOS 3.1   GFR: Estimated Creatinine Clearance: 67.1 mL/min (by C-G formula based on SCr of 0.92  mg/dL). Liver Function Tests: Recent Labs  Lab 09/05/17 1840  AST 23  ALT 24  ALKPHOS 69  BILITOT 0.5  PROT 7.2  ALBUMIN 4.3   No results for input(s): LIPASE, AMYLASE in the last 168 hours. No results for input(s): AMMONIA in the last 168 hours. Coagulation Profile: No results for input(s): INR, PROTIME in the last 168 hours. Cardiac Enzymes: Recent Labs  Lab 09/05/17 1840 09/05/17 2302 09/06/17 0524  TROPONINI <0.03 <0.03 <0.03   BNP (last 3 results) No results for input(s): PROBNP in the last 8760 hours. HbA1C: No results for input(s): HGBA1C in the last 72 hours. CBG: No results for input(s): GLUCAP in the last 168 hours. Lipid Profile: No results for input(s): CHOL, HDL, LDLCALC, TRIG, CHOLHDL, LDLDIRECT in the last 72 hours. Thyroid Function Tests: Recent Labs    09/05/17 1840  TSH 5.877*   Anemia Panel: No results for input(s): VITAMINB12, FOLATE, FERRITIN, TIBC, IRON, RETICCTPCT in the last 72 hours. Sepsis Labs: No results for input(s): PROCALCITON, LATICACIDVEN in the last 168 hours.  No results found for this or any previous visit (from the past 240 hour(s)).       Radiology Studies: Dg Chest 2 View  Result Date: 09/05/2017 CLINICAL DATA:  Chest pain and shortness of breath. EXAM: CHEST - 2 VIEW COMPARISON:  None FINDINGS: Atherosclerotic calcifications noted within the thoracic aorta. Lad coronary artery stent noted. The heart size and mediastinal contours are within normal limits. Both lungs are clear. The visualized skeletal structures are unremarkable. IMPRESSION: No active cardiopulmonary disease. Aortic Atherosclerosis (ICD10-I70.0). Electronically Signed   By: Kerby Moors M.D.   On: 09/05/2017 18:24        Scheduled Meds: . aspirin EC  81 mg Oral Daily  . clopidogrel  75 mg Oral Daily  . famotidine  20 mg Oral Daily  . levothyroxine  75 mcg Oral QAC breakfast  . rosuvastatin  20 mg Oral Daily   Continuous Infusions: . sodium  chloride 3 mL/kg/hr (09/06/17 0947)   Followed by  . sodium chloride    . heparin 1,000 Units/hr (09/06/17 0946)     LOS: 0 days    Time spent: 30 minutes    Diallo Ponder Darleen Crocker, DO [facility] Pager [phone]  If 7PM-7AM, please contact night-coverage [email or web address] Password [email or web address] 09/06/2017, 10:08 AM

## 2017-09-07 ENCOUNTER — Encounter (HOSPITAL_COMMUNITY): Payer: Self-pay | Admitting: Cardiovascular Disease

## 2017-09-07 ENCOUNTER — Telehealth: Payer: Self-pay | Admitting: *Deleted

## 2017-09-07 DIAGNOSIS — Z955 Presence of coronary angioplasty implant and graft: Secondary | ICD-10-CM

## 2017-09-07 DIAGNOSIS — E7800 Pure hypercholesterolemia, unspecified: Secondary | ICD-10-CM

## 2017-09-07 LAB — BASIC METABOLIC PANEL
Anion gap: 7 (ref 5–15)
BUN: 8 mg/dL (ref 8–23)
CO2: 22 mmol/L (ref 22–32)
Calcium: 8.4 mg/dL — ABNORMAL LOW (ref 8.9–10.3)
Chloride: 110 mmol/L (ref 98–111)
Creatinine, Ser: 0.78 mg/dL (ref 0.61–1.24)
GFR calc Af Amer: >60 mL/min (ref >60)
GFR calc non Af Amer: >60 mL/min (ref >60)
Glucose, Bld: 92 mg/dL (ref 70–99)
Potassium: 4.2 mmol/L (ref 3.5–5.1)
Sodium: 139 mmol/L (ref 135–145)

## 2017-09-07 LAB — CBC
HCT: 37.2 % — ABNORMAL LOW (ref 39.0–52.0)
Hemoglobin: 12.1 g/dL — ABNORMAL LOW (ref 13.0–17.0)
MCH: 29.7 pg (ref 26.0–34.0)
MCHC: 32.5 g/dL (ref 30.0–36.0)
MCV: 91.2 fL (ref 78.0–100.0)
Platelets: 230 10*3/uL (ref 150–400)
RBC: 4.08 MIL/uL — ABNORMAL LOW (ref 4.22–5.81)
RDW: 13.2 % (ref 11.5–15.5)
WBC: 5.7 10*3/uL (ref 4.0–10.5)

## 2017-09-07 LAB — T4, FREE: Free T4: 0.87 ng/dL (ref 0.82–1.77)

## 2017-09-07 LAB — LIPID PANEL
Cholesterol: 204 mg/dL — ABNORMAL HIGH (ref 0–200)
HDL: 25 mg/dL — ABNORMAL LOW (ref >40)
LDL Cholesterol: 142 mg/dL — ABNORMAL HIGH (ref 0–99)
Total CHOL/HDL Ratio: 8.2 RATIO
Triglycerides: 184 mg/dL — ABNORMAL HIGH (ref <150)
VLDL: 37 mg/dL (ref 0–40)

## 2017-09-07 MED ORDER — CLOPIDOGREL BISULFATE 75 MG PO TABS: 75.0000 mg | ORAL_TABLET | Freq: Every day | ORAL | 3 refills | Status: DC

## 2017-09-07 MED ORDER — ROSUVASTATIN CALCIUM 40 MG PO TABS: 40.0000 mg | ORAL_TABLET | Freq: Every day | ORAL | 6 refills | Status: DC

## 2017-09-07 MED ORDER — NITROGLYCERIN 0.4 MG SL SUBL: 0.4000 mg | SUBLINGUAL_TABLET | SUBLINGUAL | 12 refills | Status: AC | PRN

## 2017-09-07 MED FILL — Verapamil HCl IV Soln 2.5 MG/ML: INTRAVENOUS | Qty: 2 | Status: AC

## 2017-09-07 MED FILL — Nitroglycerin IV Soln 100 MCG/ML in D5W: INTRA_ARTERIAL | Qty: 10 | Status: AC

## 2017-09-07 NOTE — Discharge Summary (Addendum)
Discharge Summary    Patient ID: Keith Seaver Machia.,  MRN: [id number], DOB/AGE: Oct 29, 1940 77 y.o.  Admit date: 09/05/2017 Discharge date: 09/07/2017  Primary Care Provider: Dettinger, Fransisca Kaufmann Primary Cardiologist: Rozann Lesches, MD  Discharge Diagnoses    Principal Problem:   Unstable angina [facility]) Active Problems:   Hyperlipidemia   BPH (benign prostatic hyperplasia)   Chest pain   CAD (coronary artery disease)   Hypothyroidism   Allergies Allergies  Allergen Reactions  . Lipitor [Atorvastatin]     Muscles aches    Diagnostic Studies/Procedures    CORONARY STENT INTERVENTION  09/06/17  LEFT HEART CATH AND CORONARY ANGIOGRAPHY  Conclusion     Prox RCA lesion is 40% stenosed.  Dist RCA lesion is 40% stenosed.  Mid RCA lesion is 30% stenosed.  RPDA lesion is 50% stenosed.  Prox Cx lesion is 20% stenosed.  Ost LAD to Prox LAD lesion is 40% stenosed.  Prox LAD to Mid LAD lesion is 95% stenosed.  A drug-eluting stent was successfully placed using a STENT SYNERGY DES [id number].  Post intervention, there is a 0% residual stenosis.  Post intervention, there is a 0% residual stenosis.  The left ventricular systolic function is normal.  LV end diastolic pressure is normal.  The left ventricular ejection fraction is 55-65% by visual estimate.  There is no mitral valve regurgitation.  1. Patent proximal LAD stents with mild to moderate restenosis. Severe stenosis mid LAD beyond the old stents.  2. Successful PTCA/DES x 1 proximal and mid LAD 3. Moderate non-obstructive disease in the RCA 4. Normal LV systolic function  Recommendations: Will continue statin. No beta blocker with bradycardia. Aggrastat drip for 6 hours given thrombus in vessel during PCI.  Recommend uninterrupted dual antiplatelet therapy with Aspirin 81mg  daily and Clopidogrel 75mg  daily for a minimum of 12 months (ACS - Class I recommendation).   Echo 09/06/17 Study Conclusions  -  Left ventricle: The cavity size was normal. Wall thickness was increased increased in a pattern of mild to moderate LVH. Systolic function was normal. The estimated ejection fraction was in the range of 60% to 65%. Wall motion was normal; there were no regional wall motion abnormalities. Doppler parameters are consistent with abnormal left ventricular relaxation (grade 1 diastolic dysfunction). - Aortic valve: There was mild regurgitation. Valve area (VTI): 3.91 cm^2. Valve area (Vmax): 3.45 cm^2. Valve area (Vmean): 3.68 cm^2. - Technically adequate study.    History of Present Illness     77 y.o. male with past medical history of CAD (s/p BMS to LAD in 2001, DES to proximal LAD in 11/2015, patent stents by repeat cath in 08/2016), HLD, Hypothyroidism, [facility] for unstable [facility].   Presented to [facility] ER with worsening chest discomfort approximately 2 weeks ago which he described as a pressure along his sternum. Worse with exertion with SOB. Radiating to left arm. Resolved pain with rest. Similar to prior angina. Due to worsening symptoms advised to go to ER by clinic staff.   Initial labs show WBC 7.1, Hgb 13.5, platelets 245, Na+ 135, K+ 4.0, and creatinine 0.92.  BNP at 16. TSH 5.877. Initial and cyclic troponin values have been negative. CXR shows no active cardiopulmonary disease. EKG shows NSR, HR 62, with LAD, and no acute ST changes when compared to prior tracings.   Hospital Course     Consultants: IM as attending at [facility]  1. Unstable angina -  Troponin negative. EKG without acute changes. Given his presenting symptoms Dr. Harl Bowie recomended a repeat cardiac catheterization for definitive evaluation. Started on heparin and transferred to [facility] Cath showed patent proximal LAD stents with mild to moderate restenosis. Severe stenosis mid LAD beyond the old stents. S/p successful PTCA/DES x 1  proximal and mid LAD. Treated with aggrastat drip for 6 hours given thrombus in vessel during PCI. recommend uninterrupted dual antiplatelet therapy with Aspirin 81mg  daily and Clopidogrel 75mg  daily for a minimum of 12 months. No BB given bradycardia. Continue statin. Ambulated well without angina. R radial cath site without angina. Outpatient CRP II.   2. HLD - 09/07/2017: Cholesterol 204; HDL 25; LDL Cholesterol 142; Triglycerides 184; VLDL 37 - Will increase Crestor to 40mg  qd. LDL goal less than 70.  Lipid panel and LFTS in 6 weeks. allergic to Lipitor. If not able to tolerate higher dose, consider lipid clinic referral for PCSK9 inhibitor.   3. Abnormal TSH - TSH of 5.8. Follow up with PCP. No change today.   Discharge Vitals Blood pressure (!) 142/54, pulse (!) 50, temperature 98.5 F (36.9 C), resp. rate 13, height 5\' 8"  (1.727 m), weight 160 lb 4.4 oz (72.7 kg), SpO2 98 %.  Filed Weights   09/05/17 1750 09/05/17 2124 09/07/17 0612  Weight: 164 lb (74.4 kg) 159 lb 3.2 oz (72.2 kg) 160 lb 4.4 oz (72.7 kg)   Physical Exam  Constitutional: He is oriented to person, place, and time and well-developed, well-nourished, and in no distress.  HENT:  Head: Normocephalic and atraumatic.  Eyes: Pupils are equal, round, and reactive to light. Conjunctivae and EOM are normal.  Neck: Normal range of motion. Neck supple.  Cardiovascular: Normal rate and regular rhythm.  R radial cath site without hematoma  Pulmonary/Chest: Effort normal and breath sounds normal.  Abdominal: Soft. Bowel sounds are normal.  Musculoskeletal: Normal range of motion.  Neurological: He is alert and oriented to person, place, and time.  Skin: Skin is warm and dry.  Psychiatric: Affect normal.   Labs & Radiologic Studies    CBC Recent Labs    09/05/17 1840 09/07/17 0223  WBC 7.1 5.7  NEUTROABS 2.9  --   HGB 13.5 12.1*  HCT 39.8 37.2*  MCV 90.7 91.2  PLT 245 259   Basic Metabolic Panel Recent Labs     09/05/17 1840 09/07/17 0223  NA 135 139  K 4.0 4.2  CL 105 110  CO2 23 22  GLUCOSE 98 92  BUN 14 8  CREATININE 0.92 0.78  CALCIUM 8.6* 8.4*  MG 2.4  --   PHOS 3.1  --    Liver Function Tests Recent Labs    09/05/17 1840  AST 23  ALT 24  ALKPHOS 69  BILITOT 0.5  PROT 7.2  ALBUMIN 4.3   Cardiac Enzymes Recent Labs    09/05/17 1840 09/05/17 2302 09/06/17 0524  TROPONINI <0.03 <0.03 <0.03   Fasting Lipid Panel Recent Labs    09/07/17 0223  CHOL 204*  HDL 25*  LDLCALC 142*  TRIG 184*  CHOLHDL 8.2   Thyroid Function Tests Recent Labs    09/05/17 1840  TSH 5.877*   _____________  Dg Chest 2 View  Result Date: 09/05/2017 CLINICAL DATA:  Chest pain and shortness of breath. EXAM: CHEST - 2 VIEW COMPARISON:  None FINDINGS: Atherosclerotic calcifications noted within the thoracic aorta. Lad coronary artery stent noted. The heart size and mediastinal contours are within normal limits. Both lungs are  clear. The visualized skeletal structures are unremarkable. IMPRESSION: No active cardiopulmonary disease. Aortic Atherosclerosis (ICD10-I70.0). Electronically Signed   By: Kerby Moors M.D.   On: 09/05/2017 18:24   Disposition   Pt is being discharged home today in good condition.  Follow-up Plans & Appointments    Follow-up Information    Satira Sark, MD. Go on 09/08/2017.   Specialty:  Cardiology Why:  @9am  for follow up.  Contact information: [place] [address] [phone]        Dettinger, Fransisca Kaufmann, MD Follow up in 4 day(s).   Specialties:  Family Medicine, Cardiology Why:  for abnormal TSH Contact information: [place] [address] [phone]          Discharge Instructions    Amb Referral to Cardiac Rehabilitation   Complete by:  As directed    Diagnosis:  Coronary Stents   Diet - low sodium heart healthy   Complete by:  As directed    Discharge instructions   Complete by:  As directed    No driving  for 48 hours. No lifting over 5 lbs for 1 week. No sexual activity for 1 week.  Keep procedure site clean & dry. If you notice increased pain, swelling, bleeding or pus, call/return!  You may shower, but no soaking baths/hot tubs/pools for 1 week.   Increase activity slowly   Complete by:  As directed       Discharge Medications   Allergies as of 09/07/2017      Reactions   Lipitor [atorvastatin]    Muscles aches      Medication List    STOP taking these medications   sulfamethoxazole-trimethoprim 800-160 MG tablet Commonly known as:  BACTRIM DS     TAKE these medications   aspirin EC 81 MG tablet Take 1 tablet (81 mg total) by mouth daily.   clopidogrel 75 MG tablet Commonly known as:  PLAVIX Take 1 tablet (75 mg total) by mouth daily.   levothyroxine 75 MCG tablet Commonly known as:  SYNTHROID, LEVOTHROID Take 75 mcg by mouth daily before breakfast.   nitroGLYCERIN 0.4 MG SL tablet Commonly known as:  NITROSTAT Place 1 tablet (0.4 mg total) under the tongue every 5 (five) minutes as needed for chest pain. X 3 doses   rosuvastatin 40 MG tablet Commonly known as:  CRESTOR Take 1 tablet (40 mg total) by mouth daily. What changed:    medication strength  how much to take        Acute coronary syndrome (MI, NSTEMI, STEMI, etc) this admission?: No.    Outstanding Labs/Studies   LFT and lipid panel in 6 weeks.   Duration of Discharge Encounter   Greater than 30 minutes including physician time.  Jarrett Soho, PA 09/07/2017, 9:41 AM

## 2017-09-07 NOTE — Telephone Encounter (Signed)
Call Completed and Appointment Scheduled: Yes, Date: 09/16/17  with Dr Dettinger    DISCHARGE INFORMATION Date of Discharge:09/07/17  Discharge Facility: [facility]  Principal Discharge Diagnosis: atypical chest pain  Patient and/or caregiver is knowledgeable of his/her condition(s) and treatment: Yes. Spoke with patient and his wife.   MEDICATION RECONCILIATION Current medication list reviewed with patient:Yes  Outpatient Encounter Medications as of 09/07/2017  Medication Sig  . aspirin EC 81 MG tablet Take 1 tablet (81 mg total) by mouth daily.  . clopidogrel (PLAVIX) 75 MG tablet Take 1 tablet (75 mg total) by mouth daily.  Marland Kitchen levothyroxine (SYNTHROID, LEVOTHROID) 75 MCG tablet Take 75 mcg by mouth daily before breakfast.  . nitroGLYCERIN (NITROSTAT) 0.4 MG SL tablet Place 1 tablet (0.4 mg total) under the tongue every 5 (five) minutes as needed for chest pain. X 3 doses  . rosuvastatin (CRESTOR) 40 MG tablet Take 1 tablet (40 mg total) by mouth daily.   Facility-Administered Encounter Medications as of 09/07/2017  Medication  . 0.9 %  sodium chloride infusion  . acetaminophen (TYLENOL) tablet 650 mg  . ALPRAZolam (XANAX) tablet 1 mg  . aspirin EC tablet 81 mg  . clopidogrel (PLAVIX) tablet 75 mg  . famotidine (PEPCID) tablet 20 mg  . levothyroxine (SYNTHROID, LEVOTHROID) tablet 75 mcg  . nitroGLYCERIN (NITROSTAT) SL tablet 0.4 mg  . rosuvastatin (CRESTOR) tablet 20 mg  . sodium chloride flush (NS) 0.9 % injection 3 mL  . sodium chloride flush (NS) 0.9 % injection 3 mL  . zolpidem (AMBIEN) tablet 5 mg    Discharge Medications reviewed and reconciled with current medications.yes  Patient is able to obtain needed medications:Yes  ACTIVITIES OF DAILY LIVING  Is the patient able to perform his/her own ADLs: Yes.    Patient is receiving home health services: No.  PATIENT EDUCATION Questions/Concerns Discussed:  TSH elevated. Has hx of hypothyroidism but according to wife he is  noncompliant with medications. He started taking levothyroxine when it was first prescribed but then stopped a while back.   Need to address this at [facility] visit.   Reminded them of cardiology appointment tomorrow in [place] with Dr Domenic Polite

## 2017-09-07 NOTE — Telephone Encounter (Signed)
Left message for patient or wife to call back. I need to schedule a hospital follow up appointment for transitional care management and review a few things with the.

## 2017-09-07 NOTE — Progress Notes (Signed)
CARDIAC REHAB PHASE I   PRE:  Rate/Rhythm: 53 SB  BP:  Supine: 130/60  Sitting:   Standing:    SaO2: 97%RA  MODE:  Ambulation: 800 ft   POST:  Rate/Rhythm: 69 SR  BP:  Supine:   Sitting: 142/54  Standing:    SaO2: 97%RA 0810-0900 Pt walked 800 ft on RA with steady gait and no CP. Tolerated well. Education completed with pt who voiced understanding. Stressed importance of taking meds and not running out. Stressed importance of plavix with stent. Reviewed NTG use, risk factors, gave heart healthy diet and ex ed. Pt likes to walk. Discussed CRP 2 and will refer to [facility] program. Pt had run out of statin also. Notified RN that pt needs new prescription.   Graylon Good, RN BSN  09/07/2017 8:56 AM

## 2017-09-07 NOTE — Consult Note (Signed)
            [facility] CM Primary Care Navigator  09/07/2017  Keith Olson Asch. 02-17-1941 [id number]   [name] see patient at the bedside to identify possible discharge needs but he was already dischargedhomeper staff.  PerMD note,patient presented with chest pain. (unstable angina, coronary artery disease status post cardiac cath with stent intervention)  Primary care provider's office is listed as providing transition of care (TOC) follow-up.  Patient hasdischarge instruction to follow-up withprimary care provider in 4 days and cardiology follow-up on 09/08/17.   For additional questions please contact:  Edwena Felty A. Quintana Canelo, BSN, RN-BC [facility] PRIMARY CARE Navigator Cell: [phone]

## 2017-09-08 ENCOUNTER — Encounter: Payer: Self-pay | Admitting: Cardiology

## 2017-09-08 ENCOUNTER — Ambulatory Visit: Payer: Medicare Other | Admitting: Cardiology

## 2017-09-08 VITALS — BP 126/66 | HR 55 | Ht 68.0 in | Wt 163.4 lb

## 2017-09-08 DIAGNOSIS — E782 Mixed hyperlipidemia: Secondary | ICD-10-CM | POA: Diagnosis not present

## 2017-09-08 DIAGNOSIS — I25119 Atherosclerotic heart disease of native coronary artery with unspecified angina pectoris: Secondary | ICD-10-CM

## 2017-09-08 DIAGNOSIS — R001 Bradycardia, unspecified: Secondary | ICD-10-CM | POA: Diagnosis not present

## 2017-09-08 NOTE — Progress Notes (Signed)
Cardiology Office Note  Date: 09/08/2017   ID: Keith Toniann Fail., DOB [birth year], MRN [id number]  PCP: Dettinger, Fransisca Kaufmann, MD  Primary Cardiologist: Rozann Lesches, MD   Chief Complaint  Patient presents with  . Coronary Artery Disease    History of Present Illness: Keith Richardo Popoff. is a 77 y.o. male last seen in January.  He presents today for a post hospital visit, in fact he was just discharged yesterday from [facility].  I reviewed extensive records.  He presented with unstable angina, negative troponin I levels.  He underwent cardiac catheterization on July 9 demonstrating patent proximal LAD stent sites with mild to moderate in-stent restenosis, but severe stenosis in the mid LAD beyond the previous intervention sites, managed with DES.  Otherwise he had moderate nonobstructive RCA disease that was managed medically.  He is here with his wife today.  States that he feels much better.  He reports compliance with his medications, no pain or bleeding at his right radial access site.  We did talk about referral to cardiac rehabilitation, already made.  Crestor was increased to 40 mg daily based on recent LDL of 142.  Past Medical History:  Diagnosis Date  . Agent orange exposure   . Arthritis   . Coronary artery disease    BMS to mid LAD 2001, DES to proximal LAD 2017  . Enlarged prostate    XRT 2016  . Headache    Sinus headaches   . History of depression   . Hyperlipidemia   . Hypothyroidism   . Prostate cancer ([place]) 07/2014   hormonal therapy, external beam radiation therapy  . PTSD (post-traumatic stress disorder)     Past Surgical History:  Procedure Laterality Date  . CARDIAC CATHETERIZATION N/A 12/12/2015   Procedure: Left Heart Cath and Coronary Angiography;  Surgeon: Peter M Martinique, MD;  Location: [place] [facility];  Service: Cardiovascular;  Laterality: N/A;  . CARDIAC CATHETERIZATION N/A 12/12/2015   Procedure: Coronary Stent Intervention;  Surgeon: Peter  M Martinique, MD;  Location: [place] [facility];  Service: Cardiovascular;  Laterality: N/A;  . CORONARY STENT INTERVENTION N/A 09/06/2017   Procedure: CORONARY STENT INTERVENTION;  Surgeon: Burnell Blanks, MD;  Location: [place] [facility];  Service: Cardiovascular;  Laterality: N/A;  . HERNIA REPAIR Right   . LEFT HEART CATH AND CORONARY ANGIOGRAPHY N/A 09/02/2016   Procedure: Left Heart Cath and Coronary Angiography;  Surgeon: Leonie Man, MD;  Location: [place] [facility];  Service: Cardiovascular;  Laterality: N/A;  . LEFT HEART CATH AND CORONARY ANGIOGRAPHY N/A 09/06/2017   Procedure: LEFT HEART CATH AND CORONARY ANGIOGRAPHY;  Surgeon: Burnell Blanks, MD;  Location: [place] [facility];  Service: Cardiovascular;  Laterality: N/A;  . PROSTATE BIOPSY N/A 08/28/2014   Procedure: BIOPSY TRANSRECTAL ULTRASONIC PROSTATE (TUBP);  Surgeon: Rana Snare, MD;  Location: [facility];  Service: Urology;  Laterality: N/A;  . TRANSURETHRAL RESECTION OF PROSTATE N/A 08/28/2014   Procedure: TRANSURETHRAL RESECTION OF THE PROSTATE WITH GYRUS INSTRUMENTS;  Surgeon: Rana Snare, MD;  Location: [facility];  Service: Urology;  Laterality: N/A;    Current Outpatient Medications  Medication Sig Dispense Refill  . aspirin EC 81 MG tablet Take 1 tablet (81 mg total) by mouth daily.    . clopidogrel (PLAVIX) 75 MG tablet Take 1 tablet (75 mg total) by mouth daily. 90 tablet 3  . levothyroxine (SYNTHROID, LEVOTHROID) 75 MCG tablet Take 75 mcg by mouth daily before breakfast.    .  nitroGLYCERIN (NITROSTAT) 0.4 MG SL tablet Place 1 tablet (0.4 mg total) under the tongue every 5 (five) minutes as needed for chest pain. X 3 doses 25 tablet 12  . rosuvastatin (CRESTOR) 40 MG tablet Take 1 tablet (40 mg total) by mouth daily. 30 tablet 6   No current facility-administered medications for this visit.    Allergies:  Lipitor [atorvastatin]   Social History: The patient  reports that he has never smoked. He has quit  using smokeless tobacco. His smokeless tobacco use included chew. He reports that he does not drink alcohol or use drugs.   ROS:  Please see the history of present illness. Otherwise, complete review of systems is positive for none.  All other systems are reviewed and negative.   Physical Exam: VS:  BP 126/66   Pulse (!) 55   Ht 5\' 8"  (1.727 m)   Wt 163 lb 6.4 oz (74.1 kg)   SpO2 95%   BMI 24.84 kg/m , BMI Body mass index is 24.84 kg/m.  Wt Readings from Last 3 Encounters:  09/08/17 163 lb 6.4 oz (74.1 kg)  09/07/17 160 lb 4.4 oz (72.7 kg)  08/26/17 164 lb (74.4 kg)    General: Patient appears comfortable at rest. HEENT: Conjunctiva and lids normal, oropharynx clear. Neck: Supple, no elevated JVP or carotid bruits, no thyromegaly. Lungs: Clear to auscultation, nonlabored breathing at rest. Cardiac: Regular rate and rhythm, no S3 or significant systolic murmur, no pericardial rub. Abdomen: Soft, nontender, bowel sounds present. Extremities: No pitting edema, distal pulses 2+.  Right radial access site with clean bandage. Skin: Warm and dry. Musculoskeletal: No kyphosis. Neuropsychiatric: Alert and oriented x3, affect grossly appropriate.  ECG: I personally reviewed the tracing from 09/07/2017 which showed sinus bradycardia at 49 bpm.  Recent Labwork: 09/05/2017: ALT 24; AST 23; B Natriuretic Peptide 16.0; Magnesium 2.4; TSH 5.877 09/07/2017: BUN 8; Creatinine, Ser 0.78; Hemoglobin 12.1; Platelets 230; Potassium 4.2; Sodium 139     Component Value Date/Time   CHOL 204 (H) 09/07/2017 0223   TRIG 184 (H) 09/07/2017 0223   HDL 25 (L) 09/07/2017 0223   CHOLHDL 8.2 09/07/2017 0223   VLDL 37 09/07/2017 0223   LDLCALC 142 (H) 09/07/2017 0223    Other Studies Reviewed Today:  Cardiac catheterization 09/06/2017:  Prox RCA lesion is 40% stenosed.  Dist RCA lesion is 40% stenosed.  Mid RCA lesion is 30% stenosed.  RPDA lesion is 50% stenosed.  Prox Cx lesion is 20%  stenosed.  Ost LAD to Prox LAD lesion is 40% stenosed.  Prox LAD to Mid LAD lesion is 95% stenosed.  A drug-eluting stent was successfully placed using a STENT SYNERGY DES [id number].  Post intervention, there is a 0% residual stenosis.  Post intervention, there is a 0% residual stenosis.  The left ventricular systolic function is normal.  LV end diastolic pressure is normal.  The left ventricular ejection fraction is 55-65% by visual estimate.  There is no mitral valve regurgitation.   1. Patent proximal LAD stents with mild to moderate restenosis. Severe stenosis mid LAD beyond the old stents.  2. Successful PTCA/DES x 1 proximal and mid LAD 3. Moderate non-obstructive disease in the RCA 4. Normal LV systolic function  Echocardiogram 09/06/2017: Study Conclusions  - Left ventricle: The cavity size was normal. Wall thickness was   increased increased in a pattern of mild to moderate LVH.   Systolic function was normal. The estimated ejection fraction was   in the range of  60% to 65%. Wall motion was normal; there were no   regional wall motion abnormalities. Doppler parameters are   consistent with abnormal left ventricular relaxation (grade 1   diastolic dysfunction). - Aortic valve: There was mild regurgitation. Valve area (VTI):   3.91 cm^2. Valve area (Vmax): 3.45 cm^2. Valve area (Vmean): 3.68   cm^2. - Technically adequate study.  Assessment and Plan:  1.  CAD with history of BMS to the mid LAD in 2001 as well as DES to the proximal LAD in 2017.  He is recently status post presentation with unstable angina and placement of DES in the mid LAD distribution distal to prior stent sites.  LVEF normal range.  Plan to continue aspirin and Plavix.  Referral made to cardiac rehabilitation.  2.  Mixed hyperlipidemia recent LDL 142.  Agree with increase in Crestor to 40 mg daily for now.  Follow-up FLP and LFTs in 8 weeks.  3.  Sinus bradycardia at rest, rate typically in the  50s.  He is currently not on a beta-blocker.  Current medicines were reviewed with the patient today.   Orders Placed This Encounter  Procedures  . Lipid Profile  . Hepatic function panel    Disposition: Follow-up in 3 months.  Signed, Satira Sark, MD, [facility] 09/08/2017 9:17 AM    [facility] [place], [place], [place] [address] Phone: [phone]; Fax: [phone]

## 2017-09-08 NOTE — Patient Instructions (Signed)
Medication Instructions:  Your physician recommends that you continue on your current medications as directed. Please refer to the Current Medication list given to you today.  Labwork: LIVER/LIPIDS Orders given today   Testing/Procedures: NONE  Follow-Up: Your physician recommends that you schedule a follow-up appointment in: 3 MONTHS WITH DR. MCDOWELL  Any Other Special Instructions Will Be Listed Below (If Applicable).  If you need a refill on your cardiac medications before your next appointment, please call your pharmacy.

## 2017-09-16 ENCOUNTER — Ambulatory Visit (INDEPENDENT_AMBULATORY_CARE_PROVIDER_SITE_OTHER): Payer: Medicare Other | Admitting: Family Medicine

## 2017-09-16 ENCOUNTER — Encounter: Payer: Self-pay | Admitting: Family Medicine

## 2017-09-16 VITALS — BP 133/70 | HR 51 | Temp 96.8°F | Ht 68.0 in | Wt 163.0 lb

## 2017-09-16 DIAGNOSIS — Z23 Encounter for immunization: Secondary | ICD-10-CM

## 2017-09-16 DIAGNOSIS — I25110 Atherosclerotic heart disease of native coronary artery with unstable angina pectoris: Secondary | ICD-10-CM

## 2017-09-16 LAB — CBC WITH DIFFERENTIAL/PLATELET
Basophils Absolute: 0.1 10*3/uL (ref 0.0–0.2)
Basos: 2 %
EOS (ABSOLUTE): 0.2 10*3/uL (ref 0.0–0.4)
Eos: 3 %
Hematocrit: 41.2 % (ref 37.5–51.0)
Hemoglobin: 13.5 g/dL (ref 13.0–17.7)
Immature Grans (Abs): 0 10*3/uL (ref 0.0–0.1)
Immature Granulocytes: 0 %
Lymphocytes Absolute: 2.1 10*3/uL (ref 0.7–3.1)
Lymphs: 32 %
MCH: 30.2 pg (ref 26.6–33.0)
MCHC: 32.8 g/dL (ref 31.5–35.7)
MCV: 92 fL (ref 79–97)
Monocytes Absolute: 0.6 10*3/uL (ref 0.1–0.9)
Monocytes: 10 %
Neutrophils Absolute: 3.4 10*3/uL (ref 1.4–7.0)
Neutrophils: 53 %
Platelets: 263 10*3/uL (ref 150–450)
RBC: 4.47 x10E6/uL (ref 4.14–5.80)
RDW: 13.2 % (ref 12.3–15.4)
WBC: 6.4 10*3/uL (ref 3.4–10.8)

## 2017-09-16 LAB — CMP14+EGFR
ALT: 25 IU/L (ref 0–44)
AST: 21 IU/L (ref 0–40)
Albumin/Globulin Ratio: 1.7 (ref 1.2–2.2)
Albumin: 4.3 g/dL (ref 3.5–4.8)
Alkaline Phosphatase: 71 IU/L (ref 39–117)
BUN/Creatinine Ratio: 10 (ref 10–24)
BUN: 7 mg/dL — ABNORMAL LOW (ref 8–27)
Bilirubin Total: <0.2 mg/dL (ref 0.0–1.2)
CO2: 21 mmol/L (ref 20–29)
Calcium: 9.2 mg/dL (ref 8.6–10.2)
Chloride: 106 mmol/L (ref 96–106)
Creatinine, Ser: 0.69 mg/dL — ABNORMAL LOW (ref 0.76–1.27)
GFR calc Af Amer: 107 mL/min/{1.73_m2} (ref >59)
GFR calc non Af Amer: 93 mL/min/{1.73_m2} (ref >59)
Globulin, Total: 2.6 g/dL (ref 1.5–4.5)
Glucose: 84 mg/dL (ref 65–99)
Potassium: 4.5 mmol/L (ref 3.5–5.2)
Sodium: 141 mmol/L (ref 134–144)
Total Protein: 6.9 g/dL (ref 6.0–8.5)

## 2017-09-16 MED ORDER — LEVOTHYROXINE SODIUM 75 MCG PO TABS: 75.0000 ug | ORAL_TABLET | Freq: Every day | ORAL | 3 refills | Status: DC

## 2017-09-16 NOTE — Progress Notes (Signed)
BP 133/70 (BP Location: Left Arm)   Pulse (!) 51   Temp (!) 96.8 F (36 C) (Oral)   Ht '5\' 8"'$  (1.727 m)   Wt 163 lb (73.9 kg)   BMI 24.78 kg/m    Subjective:    Patient ID: Keith Toniann Fail., male    DOB: 08-Jun-1940, 77 y.o.   MRN: [id number]  HPI: Keith Olson. is a 77 y.o. male presenting on 09/16/2017 for Hospitalization Follow-up ([facility] - discharged 7/10.)   HPI Hospital follow-up/transitional care Patient is coming in for hospital follow-up/transitional care.  He was having chest pain and ended up finding the reason to put in a stent because of underlying CAD, he now has 3 stents in total but they placed one this time.  He went into the hospital on 09/05/2017 and was discharged on 09/07/2017.  He said he was having a lot of chest pressure and tightness for a week before he went into the hospital and then finally went in because he developed shortness of breath along with it.  He has already had hospital follow-up with cardiology as well.  He denies any further chest pain or shortness of breath since leaving the hospital.  He was contacted by our office on 09/07/2017 and this appointment was made.  Relevant past medical, surgical, family and social history reviewed and updated as indicated. Interim medical history since our last visit reviewed. Allergies and medications reviewed and updated.  Review of Systems  Constitutional: Negative for chills and fever.  Respiratory: Negative for shortness of breath and wheezing.   Cardiovascular: Negative for chest pain and leg swelling.  Musculoskeletal: Negative for back pain and gait problem.  Skin: Negative for rash.  Neurological: Negative for dizziness, weakness, light-headedness and numbness.  All other systems reviewed and are negative.   Per HPI unless specifically indicated above   Allergies as of 09/16/2017      Reactions   Lipitor [atorvastatin]    Muscles aches      Medication List        Accurate as of 09/16/17  9:45  AM. Always use your most recent med list.          aspirin EC 81 MG tablet Take 1 tablet (81 mg total) by mouth daily.   clopidogrel 75 MG tablet Commonly known as:  PLAVIX Take 1 tablet (75 mg total) by mouth daily.   levothyroxine 75 MCG tablet Commonly known as:  SYNTHROID, LEVOTHROID Take 1 tablet (75 mcg total) by mouth daily before breakfast.   nitroGLYCERIN 0.4 MG SL tablet Commonly known as:  NITROSTAT Place 1 tablet (0.4 mg total) under the tongue every 5 (five) minutes as needed for chest pain. X 3 doses   rosuvastatin 40 MG tablet Commonly known as:  CRESTOR Take 1 tablet (40 mg total) by mouth daily.          Objective:    BP 133/70 (BP Location: Left Arm)   Pulse (!) 51   Temp (!) 96.8 F (36 C) (Oral)   Ht '5\' 8"'$  (1.727 m)   Wt 163 lb (73.9 kg)   BMI 24.78 kg/m   Wt Readings from Last 3 Encounters:  09/16/17 163 lb (73.9 kg)  09/08/17 163 lb 6.4 oz (74.1 kg)  09/07/17 160 lb 4.4 oz (72.7 kg)    Physical Exam  Constitutional: He is oriented to person, place, and time. He appears well-developed and well-nourished. No distress.  Eyes: Conjunctivae are normal. No scleral icterus.  Neck: Neck supple. No thyromegaly present.  Cardiovascular: Normal rate, regular rhythm, normal heart sounds and intact distal pulses.  No murmur heard. Pulmonary/Chest: Effort normal and breath sounds normal. No respiratory distress. He has no wheezes.  Musculoskeletal: Normal range of motion. He exhibits no edema.  Lymphadenopathy:    He has no cervical adenopathy.  Neurological: He is alert and oriented to person, place, and time. Coordination normal.  Skin: Skin is warm and dry. No rash noted. He is not diaphoretic.  Psychiatric: He has a normal mood and affect. His behavior is normal.  Nursing note and vitals reviewed.       Assessment & Plan:   Problem List Items Addressed This Visit      Cardiovascular and Mediastinum   CAD (coronary artery disease) - Primary    Relevant Orders   CBC with Differential/Platelet   CMP14+EGFR    he had one stent placed and feels a lot better, is following with cardiology.  Saw them on 09/08/2016 and has another appointment in 3 months with them.  Follow up plan: Return in about 2 months (around 11/17/2017), or if symptoms worsen or fail to improve, for thyroid recheck.  Counseling provided for all of the vaccine components Orders Placed This Encounter  Procedures  . Pneumococcal conjugate vaccine 13-valent  . CBC with Differential/Platelet  . [place], MD [facility] 09/16/2017, 9:45 AM

## 2017-09-28 ENCOUNTER — Ambulatory Visit (INDEPENDENT_AMBULATORY_CARE_PROVIDER_SITE_OTHER): Payer: Medicare Other | Admitting: *Deleted

## 2017-09-28 VITALS — BP 134/74 | HR 54 | Ht 68.0 in | Wt 160.0 lb

## 2017-09-28 DIAGNOSIS — Z0000 Encounter for general adult medical examination without abnormal findings: Secondary | ICD-10-CM | POA: Diagnosis not present

## 2017-09-28 NOTE — Patient Instructions (Addendum)
Please continue your health diet and lifestyle.   Please review the information given on Advance Directives and if you complete these please bring our office a copy to be filed in your chart.   Please follow up with Dr. Warrick Parisian as scheduled.   I will put in the order for the cologard test, and this will be sent to you in the mail to complete.   Thank you for coming in for your Annual Wellness Visit today!    Preventive Care 23 Years and Older, Male Preventive care refers to lifestyle choices and visits with your health care provider that can promote health and wellness. What does preventive care include?  A yearly physical exam. This is also called an annual well check.  Dental exams once or twice a year.  Routine eye exams. Ask your health care provider how often you should have your eyes checked.  Personal lifestyle choices, including: ? Daily care of your teeth and gums. ? Regular physical activity. ? Eating a healthy diet. ? Avoiding tobacco and drug use. ? Limiting alcohol use. ? Practicing safe sex. ? Taking low doses of aspirin every day. ? Taking vitamin and mineral supplements as recommended by your health care provider. What happens during an annual well check? The services and screenings done by your health care provider during your annual well check will depend on your age, overall health, lifestyle risk factors, and family history of disease. Counseling Your health care provider may ask you questions about your:  Alcohol use.  Tobacco use.  Drug use.  Emotional well-being.  Home and relationship well-being.  Sexual activity.  Eating habits.  History of falls.  Memory and ability to understand (cognition).  Work and work Statistician.  Screening You may have the following tests or measurements:  Height, weight, and BMI.  Blood pressure.  Lipid and cholesterol levels. These may be checked every 5 years, or more frequently if you are over 19  years old.  Skin check.  Lung cancer screening. You may have this screening every year starting at age 59 if you have a 30-pack-year history of smoking and currently smoke or have quit within the past 15 years.  Fecal occult blood test (FOBT) of the stool. You may have this test every year starting at age 31.  Flexible sigmoidoscopy or colonoscopy. You may have a sigmoidoscopy every 5 years or a colonoscopy every 10 years starting at age 77.  Prostate cancer screening. Recommendations will vary depending on your family history and other risks.  Hepatitis C blood test.  Hepatitis B blood test.  Sexually transmitted disease (STD) testing.  Diabetes screening. This is done by checking your blood sugar (glucose) after you have not eaten for a while (fasting). You may have this done every 1-3 years.  Abdominal aortic aneurysm (AAA) screening. You may need this if you are a current or former smoker.  Osteoporosis. You may be screened starting at age 3 if you are at high risk.  Talk with your health care provider about your test results, treatment options, and if necessary, the need for more tests. Vaccines Your health care provider may recommend certain vaccines, such as:  Influenza vaccine. This is recommended every year.  Tetanus, diphtheria, and acellular pertussis (Tdap, Td) vaccine. You may need a Td booster every 10 years.  Varicella vaccine. You may need this if you have not been vaccinated.  Zoster vaccine. You may need this after age 78.  Measles, mumps, and rubella (MMR) vaccine.  You may need at least one dose of MMR if you were born in 1957 or later. You may also need a second dose.  Pneumococcal 13-valent conjugate (PCV13) vaccine. One dose is recommended after age 45.  Pneumococcal polysaccharide (PPSV23) vaccine. One dose is recommended after age 54.  Meningococcal vaccine. You may need this if you have certain conditions.  Hepatitis A vaccine. You may need this  if you have certain conditions or if you travel or work in places where you may be exposed to hepatitis A.  Hepatitis B vaccine. You may need this if you have certain conditions or if you travel or work in places where you may be exposed to hepatitis B.  Haemophilus influenzae type b (Hib) vaccine. You may need this if you have certain risk factors.  Talk to your health care provider about which screenings and vaccines you need and how often you need them. This information is not intended to replace advice given to you by your health care provider. Make sure you discuss any questions you have with your health care provider. Document Released: 03/14/2015 Document Revised: 11/05/2015 Document Reviewed: 12/17/2014 Elsevier Interactive Patient Education  [employer].

## 2017-09-28 NOTE — Progress Notes (Signed)
Subjective:   Keith Olson. is a 77 y.o. male who presents for an Initial Medicare Annual Wellness Visit.  Keith Olson is accompanied today by his 81 year old grand daughter who he and his wife raise.  He enjoys watching his granddaughter play on her travel volleyball team, watching television and reading.  He lives at home with his wife and granddaughter.  He has 5 children and 9 grandchildren.  He enjoys attending church with his family.  Keith Olson reports one ER visit, hospitalization, and surgical procedure all occurring 2 weeks ago due to atypical chest pain with a stent placement.  He had 2 previous stents, and now has a total of 3 stents.  He feels his health this year is about the same as it was last year.   Review of Systems   Genitourinary - groin pain  All other systems negative today  Cardiac Risk Factors include: advanced age (>38men, >34 women);male gender;dyslipidemia    Objective:    Today's Vitals   09/28/17 1033  BP: 134/74  Pulse: (!) 54  Weight: 160 lb (72.6 kg)  Height: 5\' 8"  (1.727 m)  PainSc: 5   PainLoc: Groin   Body mass index is 24.33 kg/m.  Advanced Directives 09/28/2017 09/05/2017 09/05/2017 09/02/2016 12/12/2015 09/19/2014 08/28/2014  Does Patient Have a Medical Advance Directive? No No No No No No No  Would patient like information on creating a medical advance directive? Yes (MAU/Ambulatory/Procedural Areas - Information given) No - Patient declined No - Patient declined No - Patient declined No - patient declined information Yes - Educational materials given No - patient declined information    Current Medications (verified) Outpatient Encounter Medications as of 09/28/2017  Medication Sig  . aspirin EC 81 MG tablet Take 1 tablet (81 mg total) by mouth daily.  . clopidogrel (PLAVIX) 75 MG tablet Take 1 tablet (75 mg total) by mouth daily.  Marland Kitchen levothyroxine (SYNTHROID, LEVOTHROID) 75 MCG tablet Take 1 tablet (75 mcg total) by mouth daily before breakfast.    . nitroGLYCERIN (NITROSTAT) 0.4 MG SL tablet Place 1 tablet (0.4 mg total) under the tongue every 5 (five) minutes as needed for chest pain. X 3 doses  . rosuvastatin (CRESTOR) 40 MG tablet Take 1 tablet (40 mg total) by mouth daily.   No facility-administered encounter medications on file as of 09/28/2017.     Allergies (verified) Lipitor [atorvastatin]   History: Past Medical History:  Diagnosis Date  . Agent orange exposure   . Arthritis   . Coronary artery disease    BMS to mid LAD 2001, DES to proximal LAD 2017  . Enlarged prostate    XRT 2016  . Headache    Sinus headaches   . History of depression   . Hyperlipidemia   . Hypothyroidism   . Prostate cancer ([place]) 07/2014   hormonal therapy, external beam radiation therapy  . PTSD (post-traumatic stress disorder)    Past Surgical History:  Procedure Laterality Date  . CARDIAC CATHETERIZATION N/A 12/12/2015   Procedure: Left Heart Cath and Coronary Angiography;  Surgeon: Peter M Martinique, MD;  Location: [facility];  Service: Cardiovascular;  Laterality: N/A;  . CARDIAC CATHETERIZATION N/A 12/12/2015   Procedure: Coronary Stent Intervention;  Surgeon: Peter M Martinique, MD;  Location: [facility];  Service: Cardiovascular;  Laterality: N/A;  . CORONARY STENT INTERVENTION N/A 09/06/2017   Procedure: CORONARY STENT INTERVENTION;  Surgeon: Burnell Blanks, MD;  Location: [facility];  Service: Cardiovascular;  Laterality: N/A;  . HERNIA REPAIR Right   . LEFT HEART CATH AND CORONARY ANGIOGRAPHY N/A 09/02/2016   Procedure: Left Heart Cath and Coronary Angiography;  Surgeon: Leonie Man, MD;  Location: [facility];  Service: Cardiovascular;  Laterality: N/A;  . LEFT HEART CATH AND CORONARY ANGIOGRAPHY N/A 09/06/2017   Procedure: LEFT HEART CATH AND CORONARY ANGIOGRAPHY;  Surgeon: Burnell Blanks, MD;  Location: [facility];  Service: Cardiovascular;  Laterality: N/A;  . PROSTATE BIOPSY  N/A 08/28/2014   Procedure: BIOPSY TRANSRECTAL ULTRASONIC PROSTATE (TUBP);  Surgeon: Rana Snare, MD;  Location: [facility];  Service: Urology;  Laterality: N/A;  . TRANSURETHRAL RESECTION OF PROSTATE N/A 08/28/2014   Procedure: TRANSURETHRAL RESECTION OF THE PROSTATE WITH GYRUS INSTRUMENTS;  Surgeon: Rana Snare, MD;  Location: [facility];  Service: Urology;  Laterality: N/A;   Family History  Problem Relation Age of Onset  . Arthritis Mother   . Heart attack Father    Social History   Socioeconomic History  . Marital status: Married    Spouse name: Not on file  . Number of children: Not on file  . Years of education: Not on file  . Highest education level: Not on file  Occupational History  . Occupation: retired  Scientific laboratory technician  . Financial resource strain: Not on file  . Food insecurity:    Worry: Not on file    Inability: Not on file  . Transportation needs:    Medical: Not on file    Non-medical: Not on file  Tobacco Use  . Smoking status: Never Smoker  . Smokeless tobacco: Former Systems developer    Types: Chew  Substance and Sexual Activity  . Alcohol use: No  . Drug use: No  . Sexual activity: Yes  Lifestyle  . Physical activity:    Days per week: Not on file    Minutes per session: Not on file  . Stress: Not on file  Relationships  . Social connections:    Talks on phone: Not on file    Gets together: Not on file    Attends religious service: Not on file    Active member of club or organization: Not on file    Attends meetings of clubs or organizations: Not on file    Relationship status: Not on file  Other Topics Concern  . Not on file  Social History Narrative   Married   5 children   Tobacco Counseling Counseling given: No   Clinical Intake:     Pain Score: 5    Groin pain- patient states this pain is chronic and related to history of prostate cancer and treatments.                Activities of Daily Living In your present state of health, do you  have any difficulty performing the following activities: 09/28/2017 09/06/2017  Hearing? N N  Vision? N N  Difficulty concentrating or making decisions? Y N  Comment Trouble remembering at times  -  Walking or climbing stairs? N Y  Dressing or bathing? N N  Doing errands, shopping? N N  Preparing Food and eating ? N -  Using the Toilet? N -  In the past six months, have you accidently leaked urine? N -  Do you have problems with loss of bowel control? N -  Comment Constipation -  Managing your Medications? N -  Managing your Finances? N -  Housekeeping or managing  your Housekeeping? N -  Some recent data might be hidden     Immunizations and Health Maintenance Immunization History  Administered Date(s) Administered  . Pneumococcal Conjugate-13 09/16/2017  . Td 08/26/2017   Health Maintenance Due  Topic Date Due  . COLONOSCOPY  03/24/1992   Patient agreeable to complete Cologuard.  He will check with his insurance company to ensure it is a covered benefit, and call back to let us know.    Patient Care Team: Dettinger, Fransisca Kaufmann, MD as PCP - General (Family Medicine) Satira Sark, MD as PCP - Cardiology (Cardiology) Tyler Pita, MD as Consulting Physician (Radiation Oncology)      Assessment:   This is a routine wellness examination for Keith Olson.  Hearing/Vision screen No hearing deficit noted. Patient sees eye doctor annually at the [facility] in [place], [place].  States he sees well with his glasses.  He has bilateral cataracts, and these are being monitored by his eye doctor.   Dietary issues and exercise activities discussed:  Keith Olson states he eats 3 meals per day and snacks as needed.  He enjoys cooking vegetables and pinto beans, and this is what most of his meals consist of.  He eats fish and lean ground beef, and beans for protein.  Usually has toast and honey from his honey bees for breakfast.  Advised grilling, baking, broiling or sauteeing are the healthiest  options for preparing meals.   Current Exercise Habits: Home exercise routine, Type of exercise: walking, Time (Minutes): 60, Frequency (Times/Week): 7, Weekly Exercise (Minutes/Week): 420, Intensity: Mild, Exercise limited by: cardiac condition(s);orthopedic condition(s)  Goals    . Patient Stated     Continue healthy diet and active lifestyle.       Depression Screen PHQ 2/9 Scores 09/28/2017 09/16/2017 07/04/2017 11/26/2016  PHQ - 2 Score 0 0 0 6  PHQ- 9 Score - - - 18    Fall Risk Fall Risk  09/28/2017 09/16/2017 07/04/2017 07/13/2016 06/03/2016  Falls in the past year? No No No No No    Is the patient's home free of loose throw rugs in walkways, pet beds, electrical cords, etc?   yes      Grab bars in the bathroom? no      Handrails on the stairs?   yes      Adequate lighting?   yes    Cognitive Function: MMSE - Mini Mental State Exam 09/28/2017  Orientation to time 5  Orientation to Place 5  Registration 3  Attention/ Calculation 2  Recall 2  Language- name 2 objects 2  Language- repeat 1  Language- follow 3 step command 3  Language- read & follow direction 1  Write a sentence 1  Copy design 1  Total score 26        Screening Tests Health Maintenance  Topic Date Due  . COLONOSCOPY  03/24/1992  . INFLUENZA VACCINE  10/28/2017 (Originally 09/29/2017)  . PNA vac Low Risk Adult (2 of 2 - PPSV23) 09/17/2018  . TETANUS/TDAP  08/27/2027    Qualifies for Shingles Vaccine? Declined today  Cancer Screenings: Lung: Low Dose CT Chest recommended if Age 29-80 years, 30 pack-year currently smoking OR have quit w/in 15years. Patient does not qualify. Colorectal: patient plans to do Cologuard if covered by insurance, he will let our office know after checking benefits  Additional Screenings:  Hepatitis C Screening: not indicated      Plan:     Continue your healthy diet and lifestyle.  Review  the information given on Advance Directives and if you complete these please bring  our office a copy to be filed in your chart.  Follow up with Dr. Warrick Parisian as scheduled.  Call to let office know if Cologuard is covered benefit with your insurance, and test will be ordered at your request.   I have personally reviewed and noted the following in the patient's chart:   . Medical and social history . Use of alcohol, tobacco or illicit drugs  . Current medications and supplements . Functional ability and status . Nutritional status . Physical activity . Advanced directives . List of other physicians . Hospitalizations, surgeries, and ER visits in previous 12 months . Vitals . Screenings to include cognitive, depression, and falls . Referrals and appointments  In addition, I have reviewed and discussed with patient certain preventive protocols, quality metrics, and best practice recommendations. A written personalized care plan for preventive services as well as general preventive health recommendations were provided to patient.     Denyce Robert, RN   09/28/2017

## 2017-09-29 ENCOUNTER — Telehealth: Payer: Self-pay | Admitting: Family Medicine

## 2017-10-07 ENCOUNTER — Encounter: Payer: Self-pay | Admitting: Family Medicine

## 2017-10-07 ENCOUNTER — Ambulatory Visit (INDEPENDENT_AMBULATORY_CARE_PROVIDER_SITE_OTHER): Payer: Medicare Other | Admitting: Family Medicine

## 2017-10-07 VITALS — BP 128/73 | HR 73 | Temp 96.9°F | Ht 68.0 in | Wt 161.8 lb

## 2017-10-07 DIAGNOSIS — G43C0 Periodic headache syndromes in child or adult, not intractable: Secondary | ICD-10-CM

## 2017-10-07 MED ORDER — SUMATRIPTAN SUCCINATE 25 MG PO TABS: 25.0000 mg | ORAL_TABLET | ORAL | 3 refills | Status: DC | PRN

## 2017-10-07 MED ORDER — METHYLPREDNISOLONE ACETATE 80 MG/ML IJ SUSP
80.0000 mg | Freq: Once | INTRAMUSCULAR | Status: AC
  Administered 2017-10-07: 80 mg via INTRAMUSCULAR

## 2017-10-07 MED ORDER — KETOROLAC TROMETHAMINE 60 MG/2ML IM SOLN
60.0000 mg | Freq: Once | INTRAMUSCULAR | Status: AC
  Administered 2017-10-07: 60 mg via INTRAMUSCULAR

## 2017-10-07 NOTE — Progress Notes (Signed)
BP 128/73   Pulse 73   Temp (!) 96.9 F (36.1 C) (Oral)   Ht 5\' 8"  (1.727 m)   Wt 161 lb 12.8 oz (73.4 kg)   BMI 24.60 kg/m    Subjective:    Patient ID: Keith Olson., male    DOB: Oct 08, 1940, 77 y.o.   MRN: [id number]  HPI: Keith Olson. is a 77 y.o. male presenting on 10/07/2017 for Migraine (x 7-8 days)   HPI Headache/migraine Patient is coming in with complaints of headache.  He says mostly its frontal and temporal headache and describes it as a pressure and pounding and pulsating.  He does have sensitivity to lights and sounds.  He also says he has some pounding behind his eyes on both sides.  He says is very similar to when he had migraines before and is been trying to manage it at home with medications but it does not seem to be getting better.  He is coming in today to see if he can get anything to help with this.  He says he gets these about 2 or 3 times a year.  He rates the pain is moderate in intensity today but it has been severe frequently  Relevant past medical, surgical, family and social history reviewed and updated as indicated. Interim medical history since our last visit reviewed. Allergies and medications reviewed and updated.  Review of Systems  Constitutional: Negative for chills and fever.  Eyes: Positive for photophobia. Negative for visual disturbance.  Respiratory: Negative for shortness of breath and wheezing.   Cardiovascular: Negative for chest pain and leg swelling.  Musculoskeletal: Negative for back pain and gait problem.  Skin: Negative for rash.  Neurological: Positive for headaches. Negative for dizziness, weakness, light-headedness and numbness.  All other systems reviewed and are negative.   Per HPI unless specifically indicated above   Allergies as of 10/07/2017      Reactions   Lipitor [atorvastatin]    Muscles aches      Medication List        Accurate as of 10/07/17 11:28 AM. Always use your most recent med list.          aspirin EC 81 MG tablet Take 1 tablet (81 mg total) by mouth daily.   clopidogrel 75 MG tablet Commonly known as:  PLAVIX Take 1 tablet (75 mg total) by mouth daily.   levothyroxine 75 MCG tablet Commonly known as:  SYNTHROID, LEVOTHROID Take 1 tablet (75 mcg total) by mouth daily before breakfast.   nitroGLYCERIN 0.4 MG SL tablet Commonly known as:  NITROSTAT Place 1 tablet (0.4 mg total) under the tongue every 5 (five) minutes as needed for chest pain. X 3 doses   rosuvastatin 40 MG tablet Commonly known as:  CRESTOR Take 1 tablet (40 mg total) by mouth daily.   SUMAtriptan 25 MG tablet Commonly known as:  IMITREX Take 1 tablet (25 mg total) by mouth every 2 (two) hours as needed for migraine. May repeat in 2 hours if headache persists or recurs.          Objective:    BP 128/73   Pulse 73   Temp (!) 96.9 F (36.1 C) (Oral)   Ht 5\' 8"  (1.727 m)   Wt 161 lb 12.8 oz (73.4 kg)   BMI 24.60 kg/m   Wt Readings from Last 3 Encounters:  10/07/17 161 lb 12.8 oz (73.4 kg)  09/28/17 160 lb (72.6 kg)  09/16/17 163 lb (73.9  kg)    Physical Exam  Constitutional: He is oriented to person, place, and time. He appears well-developed and well-nourished. No distress.  Eyes: Conjunctivae are normal. No scleral icterus.  Neck: Neck supple. No thyromegaly present.  Cardiovascular: Normal rate, regular rhythm, normal heart sounds and intact distal pulses.  No murmur heard. Pulmonary/Chest: Effort normal and breath sounds normal. No respiratory distress. He has no wheezes.  Musculoskeletal: Normal range of motion. He exhibits no edema.  Lymphadenopathy:    He has no cervical adenopathy.  Neurological: He is alert and oriented to person, place, and time. He displays normal reflexes. No cranial nerve deficit or sensory deficit. He exhibits normal muscle tone. Coordination normal.  Skin: Skin is warm and dry. No rash noted. He is not diaphoretic.  Psychiatric: He has a normal mood and  affect. His behavior is normal.  Nursing note and vitals reviewed.      Assessment & Plan:   Problem List Items Addressed This Visit    None    Visit Diagnoses    Periodic headache syndrome, not intractable    -  Primary   Relevant Medications   methylPREDNISolone acetate (DEPO-MEDROL) injection 80 mg (Completed)   ketorolac (TORADOL) injection 60 mg (Completed)   SUMAtriptan (IMITREX) 25 MG tablet      Follow up plan: Return if symptoms worsen or Olson to improve.  Counseling provided for all of the vaccine components No orders of the defined types were placed in this encounter.   Caryl Pina, MD [facility] 10/07/2017, 11:28 AM

## 2017-11-14 ENCOUNTER — Other Ambulatory Visit: Payer: Self-pay | Admitting: Family Medicine

## 2017-11-14 DIAGNOSIS — M19012 Primary osteoarthritis, left shoulder: Secondary | ICD-10-CM

## 2017-11-14 DIAGNOSIS — M1812 Unilateral primary osteoarthritis of first carpometacarpal joint, left hand: Secondary | ICD-10-CM

## 2017-11-14 DIAGNOSIS — M160 Bilateral primary osteoarthritis of hip: Secondary | ICD-10-CM

## 2017-11-14 DIAGNOSIS — M19011 Primary osteoarthritis, right shoulder: Secondary | ICD-10-CM

## 2017-11-15 ENCOUNTER — Ambulatory Visit: Payer: Medicare Other | Admitting: Family Medicine

## 2017-12-12 NOTE — Progress Notes (Deleted)
Cardiology Office Note  Date: 12/12/2017   ID: Keith Toniann Fail., DOB [birth year], MRN [id number]  PCP: Dettinger, Fransisca Kaufmann, MD  Primary Cardiologist: Rozann Lesches, MD   No chief complaint on file.   History of Present Illness: Keith Amelia Macken. is a 77 y.o. male last seen in July.  We discussed follow-up LFTs and FLP.  Past Medical History:  Diagnosis Date  . Agent orange exposure   . Arthritis   . Coronary artery disease    BMS to mid LAD 2001, DES to proximal LAD 2017  . Enlarged prostate    XRT 2016  . Headache    Sinus headaches   . History of depression   . Hyperlipidemia   . Hypothyroidism   . Prostate cancer (Success) 07/2014   hormonal therapy, external beam radiation therapy  . PTSD (post-traumatic stress disorder)     Past Surgical History:  Procedure Laterality Date  . CARDIAC CATHETERIZATION N/A 12/12/2015   Procedure: Left Heart Cath and Coronary Angiography;  Surgeon: Peter M Martinique, MD;  Location: [facility];  Service: Cardiovascular;  Laterality: N/A;  . CARDIAC CATHETERIZATION N/A 12/12/2015   Procedure: Coronary Stent Intervention;  Surgeon: Peter M Martinique, MD;  Location: [facility];  Service: Cardiovascular;  Laterality: N/A;  . CORONARY STENT INTERVENTION N/A 09/06/2017   Procedure: CORONARY STENT INTERVENTION;  Surgeon: Burnell Blanks, MD;  Location: [facility];  Service: Cardiovascular;  Laterality: N/A;  . HERNIA REPAIR Right   . LEFT HEART CATH AND CORONARY ANGIOGRAPHY N/A 09/02/2016   Procedure: Left Heart Cath and Coronary Angiography;  Surgeon: Leonie Man, MD;  Location: [facility];  Service: Cardiovascular;  Laterality: N/A;  . LEFT HEART CATH AND CORONARY ANGIOGRAPHY N/A 09/06/2017   Procedure: LEFT HEART CATH AND CORONARY ANGIOGRAPHY;  Surgeon: Burnell Blanks, MD;  Location: [facility];  Service: Cardiovascular;  Laterality: N/A;  . PROSTATE BIOPSY N/A 08/28/2014   Procedure:  BIOPSY TRANSRECTAL ULTRASONIC PROSTATE (TUBP);  Surgeon: Rana Snare, MD;  Location: [facility];  Service: Urology;  Laterality: N/A;  . TRANSURETHRAL RESECTION OF PROSTATE N/A 08/28/2014   Procedure: TRANSURETHRAL RESECTION OF THE PROSTATE WITH GYRUS INSTRUMENTS;  Surgeon: Rana Snare, MD;  Location: [facility];  Service: Urology;  Laterality: N/A;    Current Outpatient Medications  Medication Sig Dispense Refill  . aspirin EC 81 MG tablet Take 1 tablet (81 mg total) by mouth daily.    . clopidogrel (PLAVIX) 75 MG tablet Take 1 tablet (75 mg total) by mouth daily. 90 tablet 3  . diclofenac sodium (VOLTAREN) 1 % GEL APPLY 2 GRAMS TOPICALLY FOUR TIMES DAILY 100 g 2  . levothyroxine (SYNTHROID, LEVOTHROID) 75 MCG tablet Take 1 tablet (75 mcg total) by mouth daily before breakfast. 90 tablet 3  . nitroGLYCERIN (NITROSTAT) 0.4 MG SL tablet Place 1 tablet (0.4 mg total) under the tongue every 5 (five) minutes as needed for chest pain. X 3 doses 25 tablet 12  . rosuvastatin (CRESTOR) 40 MG tablet Take 1 tablet (40 mg total) by mouth daily. 30 tablet 6  . SUMAtriptan (IMITREX) 25 MG tablet Take 1 tablet (25 mg total) by mouth every 2 (two) hours as needed for migraine. May repeat in 2 hours if headache persists or recurs. 10 tablet 3   No current facility-administered medications for this visit.    Allergies:  Lipitor [atorvastatin]   Social History: The patient  reports that he has  never smoked. He has quit using smokeless tobacco.  His smokeless tobacco use included chew. He reports that he does not drink alcohol or use drugs.   Family History: The patient's family history includes Arthritis in his mother; Heart attack in his father.   ROS:  Please see the history of present illness. Otherwise, complete review of systems is positive for {NONE DEFAULTED:18576::"none"}.  All other systems are reviewed and negative.   Physical Exam: VS:  There were no vitals taken for this visit., BMI There is no height  or weight on file to calculate BMI.  Wt Readings from Last 3 Encounters:  10/07/17 161 lb 12.8 oz (73.4 kg)  09/28/17 160 lb (72.6 kg)  09/16/17 163 lb (73.9 kg)    General: Patient appears comfortable at rest. HEENT: Conjunctiva and lids normal, oropharynx clear with moist mucosa. Neck: Supple, no elevated JVP or carotid bruits, no thyromegaly. Lungs: Clear to auscultation, nonlabored breathing at rest. Cardiac: Regular rate and rhythm, no S3 or significant systolic murmur, no pericardial rub. Abdomen: Soft, nontender, no hepatomegaly, bowel sounds present, no guarding or rebound. Extremities: No pitting edema, distal pulses 2+. Skin: Warm and dry. Musculoskeletal: No kyphosis. Neuropsychiatric: Alert and oriented x3, affect grossly appropriate.  ECG: I personally reviewed the tracing from 09/07/2017 which showed sinus bradycardia.  Recent Labwork: 09/05/2017: B Natriuretic Peptide 16.0; Magnesium 2.4; TSH 5.877 09/16/2017: ALT 25; AST 21; BUN 7; Creatinine, Ser 0.69; Hemoglobin 13.5; Platelets 263; Potassium 4.5; Sodium 141     Component Value Date/Time   CHOL 204 (H) 09/07/2017 0223   TRIG 184 (H) 09/07/2017 0223   HDL 25 (L) 09/07/2017 0223   CHOLHDL 8.2 09/07/2017 0223   VLDL 37 09/07/2017 0223   LDLCALC 142 (H) 09/07/2017 0223    Other Studies Reviewed Today:  Cardiac catheterization 09/06/2017:  Prox RCA lesion is 40% stenosed.  Dist RCA lesion is 40% stenosed.  Mid RCA lesion is 30% stenosed.  RPDA lesion is 50% stenosed.  Prox Cx lesion is 20% stenosed.  Ost LAD to Prox LAD lesion is 40% stenosed.  Prox LAD to Mid LAD lesion is 95% stenosed.  A drug-eluting stent was successfully placed using a STENT SYNERGY DES [id number].  Post intervention, there is a 0% residual stenosis.  Post intervention, there is a 0% residual stenosis.  The left ventricular systolic function is normal.  LV end diastolic pressure is normal.  The left ventricular ejection fraction  is 55-65% by visual estimate.  There is no mitral valve regurgitation.  1. Patent proximal LAD stents with mild to moderate restenosis. Severe stenosis mid LAD beyond the old stents.  2. Successful PTCA/DES x 1 proximal and mid LAD 3. Moderate non-obstructive disease in the RCA 4. Normal LV systolic function  Echocardiogram 09/06/2017: Study Conclusions  - Left ventricle: The cavity size was normal. Wall thickness was increased increased in a pattern of mild to moderate LVH. Systolic function was normal. The estimated ejection fraction was in the range of 60% to 65%. Wall motion was normal; there were no regional wall motion abnormalities. Doppler parameters are consistent with abnormal left ventricular relaxation (grade 1 diastolic dysfunction). - Aortic valve: There was mild regurgitation. Valve area (VTI): 3.91 cm^2. Valve area (Vmax): 3.45 cm^2. Valve area (Vmean): 3.68 cm^2. - Technically adequate study.  Assessment and Plan:    Current medicines were reviewed with the patient today.  No orders of the defined types were placed in this encounter.   Disposition:  Signed, Aloha Gell.  Domenic Polite, MD, [facility] 12/12/2017 12:43 PM    [facility] [place], [place], [place] [address] Phone: [phone]; Fax: [phone]

## 2017-12-14 ENCOUNTER — Encounter: Payer: Self-pay | Admitting: Cardiology

## 2017-12-14 ENCOUNTER — Ambulatory Visit: Payer: Medicare Other | Admitting: Cardiology

## 2017-12-20 ENCOUNTER — Telehealth: Payer: Self-pay | Admitting: Family Medicine

## 2017-12-20 NOTE — Telephone Encounter (Signed)
Called - spoke with Son - he will relay a message that pt needs a CPE soon with Dr Building control surveyor - he will call back and arrange that.  (he has [employer]-medicare and can have a CPE yearly for free)  Needs PSA (last was 06/2014 and was elevated. He sees urology as well = he may need to contact them

## 2017-12-21 ENCOUNTER — Ambulatory Visit (INDEPENDENT_AMBULATORY_CARE_PROVIDER_SITE_OTHER): Payer: Medicare Other | Admitting: Family Medicine

## 2017-12-21 ENCOUNTER — Encounter: Payer: Self-pay | Admitting: Family Medicine

## 2017-12-21 VITALS — BP 136/78 | HR 57 | Temp 97.7°F | Ht 68.0 in | Wt 161.0 lb

## 2017-12-21 DIAGNOSIS — R3914 Feeling of incomplete bladder emptying: Secondary | ICD-10-CM | POA: Diagnosis not present

## 2017-12-21 DIAGNOSIS — C61 Malignant neoplasm of prostate: Secondary | ICD-10-CM | POA: Diagnosis not present

## 2017-12-21 DIAGNOSIS — R300 Dysuria: Secondary | ICD-10-CM

## 2017-12-21 DIAGNOSIS — R338 Other retention of urine: Secondary | ICD-10-CM | POA: Diagnosis not present

## 2017-12-21 DIAGNOSIS — R3912 Poor urinary stream: Secondary | ICD-10-CM | POA: Diagnosis not present

## 2017-12-21 DIAGNOSIS — R339 Retention of urine, unspecified: Secondary | ICD-10-CM

## 2017-12-21 LAB — URINALYSIS, COMPLETE
Bilirubin, UA: NEGATIVE
Glucose, UA: NEGATIVE
Ketones, UA: NEGATIVE
Leukocytes, UA: NEGATIVE
Nitrite, UA: NEGATIVE
Protein, UA: NEGATIVE
RBC, UA: NEGATIVE
Specific Gravity, UA: <1.005 — ABNORMAL LOW (ref 1.005–1.030)
Urobilinogen, Ur: 0.2 mg/dL (ref 0.2–1.0)
pH, UA: 6 (ref 5.0–7.5)

## 2017-12-21 LAB — MICROSCOPIC EXAMINATION
Bacteria, UA: NONE SEEN
Epithelial Cells (non renal): NONE SEEN /hpf (ref 0–10)
RBC, UA: NONE SEEN /hpf (ref 0–2)
Renal Epithel, UA: NONE SEEN /hpf
WBC, UA: NONE SEEN /hpf (ref 0–5)

## 2017-12-21 MED ORDER — ACETAMINOPHEN-CODEINE #3 300-30 MG PO TABS: 1.0000 | ORAL_TABLET | Freq: Four times a day (QID) | ORAL | 0 refills | Status: DC | PRN

## 2017-12-21 NOTE — Progress Notes (Signed)
BP 136/78   Pulse (!) 57   Temp 97.7 F (36.5 C) (Oral)   Ht 5\' 8"  (1.727 m)   Wt 161 lb (73 kg)   BMI 24.48 kg/m    Subjective:    Patient ID: Keith Toniann Fail., male    DOB: 1940-06-11, 77 y.o.   MRN: [id number]  HPI: Keith Janiel Crisostomo. is a 77 y.o. male presenting on 12/21/2017 for prostate pain (Has cancer and states that it hurts and burns. Wants PSA tested and also wants something for pain.)   HPI Pain with urination and urinary retention Patient comes in complaining of pain with urination and urinary retention that has been causing issues over the past couple weeks.  He says is been increasing in severity and frequency that he cannot completely empty.  He denies any hematuria or fevers or chills or flank pain but does feel some bladder fullness and pressure and pain with that that extends down into his scrotum and hurts when he has to try to urinate and he says he has to sit there him push a lot to even start urination.  He has a history of prostate cancer and has a urologist and Dr. ball.  Relevant past medical, surgical, family and social history reviewed and updated as indicated. Interim medical history since our last visit reviewed. Allergies and medications reviewed and updated.  Review of Systems  Constitutional: Negative for chills and fever.  Respiratory: Negative for shortness of breath and wheezing.   Cardiovascular: Negative for chest pain and leg swelling.  Gastrointestinal: Positive for abdominal pain.  Genitourinary: Positive for decreased urine volume, difficulty urinating, dysuria, frequency and urgency. Negative for hematuria.  Musculoskeletal: Negative for back pain and gait problem.  Skin: Negative for rash.  All other systems reviewed and are negative.   Per HPI unless specifically indicated above   Allergies as of 12/21/2017      Reactions   Lipitor [atorvastatin]    Muscles aches      Medication List        Accurate as of 12/21/17 10:15 AM.  Always use your most recent med list.          aspirin EC 81 MG tablet Take 1 tablet (81 mg total) by mouth daily.   clopidogrel 75 MG tablet Commonly known as:  PLAVIX Take 1 tablet (75 mg total) by mouth daily.   levothyroxine 75 MCG tablet Commonly known as:  SYNTHROID, LEVOTHROID Take 1 tablet (75 mcg total) by mouth daily before breakfast.   nitroGLYCERIN 0.4 MG SL tablet Commonly known as:  NITROSTAT Place 1 tablet (0.4 mg total) under the tongue every 5 (five) minutes as needed for chest pain. X 3 doses   rosuvastatin 40 MG tablet Commonly known as:  CRESTOR Take 1 tablet (40 mg total) by mouth daily.   SUMAtriptan 25 MG tablet Commonly known as:  IMITREX Take 1 tablet (25 mg total) by mouth every 2 (two) hours as needed for migraine. May repeat in 2 hours if headache persists or recurs.          Objective:    BP 136/78   Pulse (!) 57   Temp 97.7 F (36.5 C) (Oral)   Ht 5\' 8"  (1.727 m)   Wt 161 lb (73 kg)   BMI 24.48 kg/m   Wt Readings from Last 3 Encounters:  12/21/17 161 lb (73 kg)  10/07/17 161 lb 12.8 oz (73.4 kg)  09/28/17 160 lb (72.6 kg)  Physical Exam  Constitutional: He is oriented to person, place, and time. He appears well-developed and well-nourished. No distress.  Eyes: Conjunctivae are normal. No scleral icterus.  Pulmonary/Chest: He has no wheezes.  Abdominal: Soft. Bowel sounds are normal. He exhibits no distension and no mass. There is tenderness (Lower abdominal tenderness and fullness). There is no guarding.  Genitourinary: Right testis shows tenderness (Tenderness extending down into the scrotum). Right testis shows no swelling. Left testis shows tenderness. Left testis shows no swelling.  Neurological: He is alert and oriented to person, place, and time. Coordination normal.  Skin: Skin is warm and dry. No rash noted. He is not diaphoretic.  Psychiatric: He has a normal mood and affect. His behavior is normal.  Nursing note and  vitals reviewed.  Patient declined rectal exam because he is going to see urology  Urinalysis: No signs of infection, clear urinalysis    Assessment & Plan:   Problem List Items Addressed This Visit      Genitourinary   Prostate cancer    Other Visit Diagnoses    Dysuria       Relevant Orders   Urinalysis, Complete   Urinary retention          Called down to [facility] and they will see him today at 2 PM with Dr. Gloriann Loan because of urinary retention Follow up plan: Return if symptoms worsen or fail to improve.  Counseling provided for all of the vaccine components Orders Placed This Encounter  Procedures  . Urinalysis, Complete  . PSA, total and free    Caryl Pina, MD [facility] 12/21/2017, 10:15 AM

## 2017-12-22 LAB — PSA, TOTAL AND FREE
PSA, Free Pct: <10 %
PSA, Free: <0.02 ng/mL
Prostate Specific Ag, Serum: 0.2 ng/mL (ref 0.0–4.0)

## 2017-12-28 ENCOUNTER — Telehealth: Payer: Self-pay | Admitting: Family Medicine

## 2017-12-28 NOTE — Telephone Encounter (Signed)
Patient aware and verbalizes understanding about lab results.

## 2018-01-18 ENCOUNTER — Encounter: Payer: Self-pay | Admitting: Family Medicine

## 2018-01-18 ENCOUNTER — Ambulatory Visit (INDEPENDENT_AMBULATORY_CARE_PROVIDER_SITE_OTHER): Payer: Medicare Other | Admitting: Family Medicine

## 2018-01-18 VITALS — BP 132/76 | HR 60 | Temp 96.1°F | Ht 68.0 in | Wt 162.2 lb

## 2018-01-18 DIAGNOSIS — R251 Tremor, unspecified: Secondary | ICD-10-CM

## 2018-01-18 MED ORDER — TOPIRAMATE 25 MG PO TABS: 25.0000 mg | ORAL_TABLET | Freq: Two times a day (BID) | ORAL | 3 refills | Status: DC

## 2018-01-18 NOTE — Progress Notes (Signed)
BP 132/76   Pulse 60   Temp (!) 96.1 F (35.6 C) (Oral)   Ht _0  (1.727 m)   Wt 162 lb 3.2 oz (73.6 kg)   BMI 24.66 kg/m    Subjective:    Patient ID: Keith Toniann Fail., male    DOB: 1940-10-12, 77 y.o.   MRN: [id number]  HPI: Keith Olson. is a 76 y.o. male presenting on 01/18/2018 for hand tremor (x 2-3  months and has gotten worse )   HPI Tremor Patient is coming in for tremor.  He says he had a tremor over the past 4 months he is essentially noticed it that is been worsening every week to where he is even having trouble now holding things like his coffee without spilling them because the tremor has been worsening in both of his hands.  He also feels like the tremor is going through his whole body and feels that even in his chest and sometimes even his face.  The tremor is mainly visible in his hands though.  His wife says she notices it when she hugs them as well.  He is concerned because of how rapidly is been increasing.  He denies any differences in numbness or weakness or focal changes.  He denies any difficulties walking.  Relevant past medical, surgical, family and social history reviewed and updated as indicated. Interim medical history since our last visit reviewed. Allergies and medications reviewed and updated.  Review of Systems  Constitutional: Negative for chills and fever.  Respiratory: Negative for shortness of breath and wheezing.   Cardiovascular: Negative for chest pain and leg swelling.  Musculoskeletal: Negative for back pain and gait problem.  Skin: Negative for rash.  Neurological: Positive for tremors. Negative for dizziness, facial asymmetry, weakness, light-headedness and numbness.  All other systems reviewed and are negative.   Per HPI unless specifically indicated above        Objective:    BP 132/76   Pulse 60   Temp (!) 96.1 F (35.6 C) (Oral)   Ht _1  (1.727 m)   Wt 162 lb 3.2 oz (73.6 kg)   BMI 24.66 kg/m   Wt Readings from  Last 3 Encounters:  01/18/18 162 lb 3.2 oz (73.6 kg)  12/21/17 161 lb (73 kg)  10/07/17 161 lb 12.8 oz (73.4 kg)    Physical Exam  Constitutional: He is oriented to person, place, and time. He appears well-developed and well-nourished. No distress.  Eyes: Conjunctivae are normal. No scleral icterus.  Cardiovascular: Normal rate, regular rhythm, normal heart sounds and intact distal pulses.  No murmur heard. Pulmonary/Chest: Effort normal and breath sounds normal. No respiratory distress. He has no wheezes.  Musculoskeletal: Normal range of motion. He exhibits no edema.  Neurological: He is alert and oriented to person, place, and time. He displays tremor (Fine tremor in both hands at rest). He displays normal reflexes. No cranial nerve deficit or sensory deficit. He exhibits normal muscle tone. Coordination and gait normal.  No abnormal gait or facies, no cogwheeling rigidity  Skin: Skin is warm and dry. No rash noted. He is not diaphoretic.  Psychiatric: He has a normal mood and affect. His behavior is normal.  Nursing note and vitals reviewed.       Assessment & Plan:   Problem List Items Addressed This Visit    None    Visit Diagnoses    Tremor    -  Primary   Likely essential tremor, he  says is been worsening over the past few months every week, will try Topamax and neurology and labs   Relevant Medications   topiramate (TOPAMAX) 25 MG tablet   Other Relevant Orders   TSH   CMP14+EGFR   CBC with Differential/Platelet   Vitamin B12   Folate   Ambulatory referral to Neurology     We will check labs and try Topamax for possible essential tremor, not likely a great candidate for propranolol because his heart rate already 60, will also do referral to neurology  Follow up plan: Return in about 3 months (around 04/20/2018), or if symptoms worsen or fail to improve, for Recheck tremor and anxiety.  Counseling provided for all of the vaccine components Orders Placed This  Encounter  Procedures  . TSH  . CMP14+EGFR  . CBC with Differential/Platelet  . Vitamin B12  . Folate  . Ambulatory referral to Neurology    Caryl Pina, MD [facility] 01/18/2018, 5:12 PM

## 2018-01-19 LAB — CBC WITH DIFFERENTIAL/PLATELET
Basophils Absolute: 0.1 10*3/uL (ref 0.0–0.2)
Basos: 1 %
EOS (ABSOLUTE): 0.3 10*3/uL (ref 0.0–0.4)
Eos: 4 %
Hematocrit: 43.5 % (ref 37.5–51.0)
Hemoglobin: 14.6 g/dL (ref 13.0–17.7)
Immature Grans (Abs): 0 10*3/uL (ref 0.0–0.1)
Immature Granulocytes: 0 %
Lymphocytes Absolute: 3.1 10*3/uL (ref 0.7–3.1)
Lymphs: 42 %
MCH: 30.8 pg (ref 26.6–33.0)
MCHC: 33.6 g/dL (ref 31.5–35.7)
MCV: 92 fL (ref 79–97)
Monocytes Absolute: 0.8 10*3/uL (ref 0.1–0.9)
Monocytes: 11 %
Neutrophils Absolute: 3.2 10*3/uL (ref 1.4–7.0)
Neutrophils: 42 %
Platelets: 254 10*3/uL (ref 150–450)
RBC: 4.74 x10E6/uL (ref 4.14–5.80)
RDW: 12.6 % (ref 12.3–15.4)
WBC: 7.5 10*3/uL (ref 3.4–10.8)

## 2018-01-19 LAB — CMP14+EGFR
ALT: 39 IU/L (ref 0–44)
AST: 31 IU/L (ref 0–40)
Albumin/Globulin Ratio: 1.7 (ref 1.2–2.2)
Albumin: 4.6 g/dL (ref 3.5–4.8)
Alkaline Phosphatase: 64 IU/L (ref 39–117)
BUN/Creatinine Ratio: 12 (ref 10–24)
BUN: 9 mg/dL (ref 8–27)
Bilirubin Total: <0.2 mg/dL (ref 0.0–1.2)
CO2: 24 mmol/L (ref 20–29)
Calcium: 9.4 mg/dL (ref 8.6–10.2)
Chloride: 102 mmol/L (ref 96–106)
Creatinine, Ser: 0.77 mg/dL (ref 0.76–1.27)
GFR calc Af Amer: 103 mL/min/{1.73_m2} (ref >59)
GFR calc non Af Amer: 89 mL/min/{1.73_m2} (ref >59)
Globulin, Total: 2.7 g/dL (ref 1.5–4.5)
Glucose: 97 mg/dL (ref 65–99)
Potassium: 4.5 mmol/L (ref 3.5–5.2)
Sodium: 141 mmol/L (ref 134–144)
Total Protein: 7.3 g/dL (ref 6.0–8.5)

## 2018-01-19 LAB — TSH: TSH: 0.465 u[IU]/mL (ref 0.450–4.500)

## 2018-01-19 LAB — FOLATE: Folate: 5.2 ng/mL (ref >3.0)

## 2018-01-19 LAB — VITAMIN B12: Vitamin B-12: 495 pg/mL (ref 232–1245)

## 2018-03-09 ENCOUNTER — Encounter: Payer: Self-pay | Admitting: Family

## 2018-03-09 ENCOUNTER — Ambulatory Visit (INDEPENDENT_AMBULATORY_CARE_PROVIDER_SITE_OTHER): Payer: Medicare Other | Admitting: Family

## 2018-03-09 VITALS — BP 140/70 | HR 52 | Temp 96.9°F | Ht 68.0 in | Wt 163.4 lb

## 2018-03-09 DIAGNOSIS — J019 Acute sinusitis, unspecified: Secondary | ICD-10-CM | POA: Diagnosis not present

## 2018-03-09 MED ORDER — ACETAMINOPHEN-CODEINE #3 300-30 MG PO TABS: 1.0000 | ORAL_TABLET | Freq: Four times a day (QID) | ORAL | 0 refills | Status: DC | PRN

## 2018-03-09 MED ORDER — AMOXICILLIN-POT CLAVULANATE 875-125 MG PO TABS: 1.0000 | ORAL_TABLET | Freq: Two times a day (BID) | ORAL | 0 refills | Status: DC

## 2018-03-09 NOTE — Progress Notes (Signed)
Subjective:    Patient ID: Keith Toniann Fail., male    DOB: 04/14/1940, 78 y.o.   MRN: [id number]  Chief Complaint  Patient presents with  . Sinusitis    about month, lots of OTCs have not helped    Sinusitis  The current episode started more than 1 month ago. The problem has been gradually worsening since onset. His pain is at a severity of 6/10. The pain is moderate. Associated symptoms include congestion, coughing, headaches, a hoarse voice, shortness of breath, sinus pressure, sneezing and a sore throat. Pertinent negatives include no chills or ear pain. Past treatments include oral decongestants. The treatment provided mild relief.      Review of Systems  Constitutional: Negative for chills.  HENT: Positive for congestion, hoarse voice, sinus pressure, sneezing and sore throat. Negative for ear pain.   Respiratory: Positive for cough and shortness of breath.   Neurological: Positive for headaches.  All other systems reviewed and are negative.      Objective:   Physical Exam Vitals signs reviewed.  Constitutional:      General: He is not in acute distress.    Appearance: He is well-developed.  HENT:     Head: Normocephalic.     Nose: Mucosal edema and rhinorrhea present.     Right Sinus: Maxillary sinus tenderness and frontal sinus tenderness present.     Left Sinus: Maxillary sinus tenderness and frontal sinus tenderness present.     Mouth/Throat:     Pharynx: Posterior oropharyngeal erythema present.  Eyes:     General:        Right eye: No discharge.        Left eye: No discharge.     Pupils: Pupils are equal, round, and reactive to light.  Neck:     Musculoskeletal: Normal range of motion and neck supple.     Thyroid: No thyromegaly.  Cardiovascular:     Rate and Rhythm: Normal rate and regular rhythm.     Heart sounds: Normal heart sounds. No murmur.  Pulmonary:     Effort: Pulmonary effort is normal. No respiratory distress.     Breath sounds: Normal  breath sounds. No wheezing.  Abdominal:     General: Bowel sounds are normal. There is no distension.     Palpations: Abdomen is soft.     Tenderness: There is no abdominal tenderness.  Musculoskeletal: Normal range of motion.        General: No tenderness.  Skin:    General: Skin is warm and dry.     Findings: No erythema or rash.  Neurological:     Mental Status: He is alert and oriented to person, place, and time.     Cranial Nerves: No cranial nerve deficit.     Deep Tendon Reflexes: Reflexes are normal and symmetric.  Psychiatric:        Behavior: Behavior normal.        Thought Content: Thought content normal.        Judgment: Judgment normal.       BP 140/70   Pulse (!) 52   Temp (!) 96.9 F (36.1 C) (Oral)   Ht 5\' 8"  (1.727 m)   Wt 163 lb 6.4 oz (74.1 kg)   BMI 24.84 kg/m      Assessment & Plan:  Keith Toniann Fail. comes in today with chief complaint of Sinusitis (about month, lots of OTCs have not helped)   Diagnosis and orders addressed:  1. Acute non-recurrent  sinusitis, unspecified location - Take meds as prescribed - Use a cool mist humidifier  -Use saline nose sprays frequently -Force fluids -For any cough or congestion  Use plain Mucinex- regular strength or max strength is fine -For fever or aces or pains- take tylenol or ibuprofen. -Throat lozenges if help -New toothbrush in 3 days RTO if symptoms worsen or do not improve  - amoxicillin-clavulanate (AUGMENTIN) 875-125 MG tablet; Take 1 tablet by mouth 2 (two) times daily.  Dispense: 20 tablet; Refill: 0   Evelina Dun, FNP

## 2018-03-09 NOTE — Patient Instructions (Signed)
Sinusitis, Adult  Sinusitis is inflammation of your sinuses. Sinuses are hollow spaces in the bones around your face. Your sinuses are located:   Around your eyes.   In the middle of your forehead.   Behind your nose.   In your cheekbones.  Mucus normally drains out of your sinuses. When your nasal tissues become inflamed or swollen, mucus can become trapped or blocked. This allows bacteria, viruses, and fungi to grow, which leads to infection. Most infections of the sinuses are caused by a virus.  Sinusitis can develop quickly. It can last for up to 4 weeks (acute) or for more than 12 weeks (chronic). Sinusitis often develops after a cold.  What are the causes?  This condition is caused by anything that creates swelling in the sinuses or stops mucus from draining. This includes:   Allergies.   Asthma.   Infection from bacteria or viruses.   Deformities or blockages in your nose or sinuses.   Abnormal growths in the nose (nasal polyps).   Pollutants, such as chemicals or irritants in the air.   Infection from fungi (rare).  What increases the risk?  You are more likely to develop this condition if you:   Have a weak body defense system (immune system).   Do a lot of swimming or diving.   Overuse nasal sprays.   Smoke.  What are the signs or symptoms?  The main symptoms of this condition are pain and a feeling of pressure around the affected sinuses. Other symptoms include:   Stuffy nose or congestion.   Thick drainage from your nose.   Swelling and warmth over the affected sinuses.   Headache.   Upper toothache.   A cough that may get worse at night.   Extra mucus that collects in the throat or the back of the nose (postnasal drip).   Decreased sense of smell and taste.   Fatigue.   A fever.   Sore throat.   Bad breath.  How is this diagnosed?  This condition is diagnosed based on:   Your symptoms.   Your medical history.   A physical exam.   Tests to find out if your condition is  acute or chronic. This may include:  ? Checking your nose for nasal polyps.  ? Viewing your sinuses using a device that has a light (endoscope).  ? Testing for allergies or bacteria.  ? Imaging tests, such as an MRI or CT scan.  In rare cases, a bone biopsy may be done to rule out more serious types of fungal sinus disease.  How is this treated?  Treatment for sinusitis depends on the cause and whether your condition is chronic or acute.   If caused by a virus, your symptoms should go away on their own within 10 days. You may be given medicines to relieve symptoms. They include:  ? Medicines that shrink swollen nasal passages (topical intranasal decongestants).  ? Medicines that treat allergies (antihistamines).  ? A spray that eases inflammation of the nostrils (topical intranasal corticosteroids).  ? Rinses that help get rid of thick mucus in your nose (nasal saline washes).   If caused by bacteria, your health care provider may recommend waiting to see if your symptoms improve. Most bacterial infections will get better without antibiotic medicine. You may be given antibiotics if you have:  ? A severe infection.  ? A weak immune system.   If caused by narrow nasal passages or nasal polyps, you may need   to have surgery.  Follow these instructions at home:  Medicines   Take, use, or apply over-the-counter and prescription medicines only as told by your health care provider. These may include nasal sprays.   If you were prescribed an antibiotic medicine, take it as told by your health care provider. Do not stop taking the antibiotic even if you start to feel better.  Hydrate and humidify     Drink enough fluid to keep your urine pale yellow. Staying hydrated will help to thin your mucus.   Use a cool mist humidifier to keep the humidity level in your home above 50%.   Inhale steam for 10-15 minutes, 3-4 times a day, or as told by your health care provider. You can do this in the bathroom while a hot shower is  running.   Limit your exposure to cool or dry air.  Rest   Rest as much as possible.   Sleep with your head raised (elevated).   Make sure you get enough sleep each night.  General instructions     Apply a warm, moist washcloth to your face 3-4 times a day or as told by your health care provider. This will help with discomfort.   Wash your hands often with soap and water to reduce your exposure to germs. If soap and water are not available, use hand sanitizer.   Do not smoke. Avoid being around people who are smoking (secondhand smoke).   Keep all follow-up visits as told by your health care provider. This is important.  Contact a health care provider if:   You have a fever.   Your symptoms get worse.   Your symptoms do not improve within 10 days.  Get help right away if:   You have a severe headache.   You have persistent vomiting.   You have severe pain or swelling around your face or eyes.   You have vision problems.   You develop confusion.   Your neck is stiff.   You have trouble breathing.  Summary   Sinusitis is soreness and inflammation of your sinuses. Sinuses are hollow spaces in the bones around your face.   This condition is caused by nasal tissues that become inflamed or swollen. The swelling traps or blocks the flow of mucus. This allows bacteria, viruses, and fungi to grow, which leads to infection.   If you were prescribed an antibiotic medicine, take it as told by your health care provider. Do not stop taking the antibiotic even if you start to feel better.   Keep all follow-up visits as told by your health care provider. This is important.  This information is not intended to replace advice given to you by your health care provider. Make sure you discuss any questions you have with your health care provider.  Document Released: 02/15/2005 Document Revised: 07/18/2017 Document Reviewed: 07/18/2017  Elsevier Interactive Patient Education  2019 [employer]

## 2018-04-21 ENCOUNTER — Other Ambulatory Visit: Payer: Self-pay | Admitting: Family Medicine

## 2018-04-21 DIAGNOSIS — G43C0 Periodic headache syndromes in child or adult, not intractable: Secondary | ICD-10-CM

## 2018-04-29 ENCOUNTER — Other Ambulatory Visit: Payer: Self-pay | Admitting: Family Medicine

## 2018-04-29 DIAGNOSIS — M1812 Unilateral primary osteoarthritis of first carpometacarpal joint, left hand: Secondary | ICD-10-CM

## 2018-04-29 DIAGNOSIS — M19011 Primary osteoarthritis, right shoulder: Secondary | ICD-10-CM

## 2018-04-29 DIAGNOSIS — M19012 Primary osteoarthritis, left shoulder: Secondary | ICD-10-CM

## 2018-04-29 DIAGNOSIS — M160 Bilateral primary osteoarthritis of hip: Secondary | ICD-10-CM

## 2018-05-22 ENCOUNTER — Ambulatory Visit (INDEPENDENT_AMBULATORY_CARE_PROVIDER_SITE_OTHER): Payer: Medicare Other | Admitting: Family Medicine

## 2018-05-22 ENCOUNTER — Encounter: Payer: Self-pay | Admitting: Family Medicine

## 2018-05-22 ENCOUNTER — Other Ambulatory Visit: Payer: Self-pay

## 2018-05-22 VITALS — BP 139/72 | HR 56 | Temp 97.3°F | Ht 68.0 in | Wt 161.8 lb

## 2018-05-22 DIAGNOSIS — R10819 Abdominal tenderness, unspecified site: Secondary | ICD-10-CM | POA: Diagnosis not present

## 2018-05-22 DIAGNOSIS — R339 Retention of urine, unspecified: Secondary | ICD-10-CM

## 2018-05-22 LAB — URINALYSIS, COMPLETE
Bilirubin, UA: NEGATIVE
Glucose, UA: NEGATIVE
Ketones, UA: NEGATIVE
Leukocytes, UA: NEGATIVE
Nitrite, UA: NEGATIVE
Protein, UA: NEGATIVE
RBC, UA: NEGATIVE
Specific Gravity, UA: 1.02 (ref 1.005–1.030)
Urobilinogen, Ur: 0.2 mg/dL (ref 0.2–1.0)
pH, UA: 5.5 (ref 5.0–7.5)

## 2018-05-22 LAB — MICROSCOPIC EXAMINATION
Bacteria, UA: NONE SEEN
Epithelial Cells (non renal): NONE SEEN /hpf (ref 0–10)
RBC, UA: NONE SEEN /hpf (ref 0–2)
Renal Epithel, UA: NONE SEEN /hpf
WBC, UA: NONE SEEN /hpf (ref 0–5)

## 2018-05-22 MED ORDER — ACETAMINOPHEN-CODEINE #3 300-30 MG PO TABS: 1.0000 | ORAL_TABLET | Freq: Four times a day (QID) | ORAL | 0 refills | Status: DC | PRN

## 2018-05-22 NOTE — Progress Notes (Signed)
BP 139/72    Pulse (!) 56    Temp (!) 97.3 F (36.3 C) (Oral)    Ht _0  (1.727 m)    Wt 161 lb 12.8 oz (73.4 kg)    BMI 24.60 kg/m    Subjective:   Patient ID: Keith Olson., male    DOB: [birth year], 78 y.o.   MRN: [id number]  HPI: Keith Olson. is a 78 y.o. male presenting on 05/22/2018 for Groin Pain (x 1-2 weeks)   HPI Patient comes in complaining of groin pain and lower abdominal pain that is been bothering him over the past 1 to 2 weeks.  He says he has also been having trouble emptying his bladder and difficulty with urination and has been having to strain significantly to have regular urination.  He says that is been gradually coming on but has been worse over the past couple weeks as well.  He denies any fevers or chills.  He does say that the pubic and abdominal pain does radiate around to both sides on the kidneys.  Patient has a history of prostate cancer  Relevant past medical, surgical, family and social history reviewed and updated as indicated. Interim medical history since our last visit reviewed. Allergies and medications reviewed and updated.  Review of Systems  Constitutional: Negative for chills and fever.  Respiratory: Negative for shortness of breath and wheezing.   Cardiovascular: Negative for chest pain and leg swelling.  Gastrointestinal: Positive for abdominal pain. Negative for constipation, diarrhea, nausea and vomiting.  Genitourinary: Positive for decreased urine volume, difficulty urinating and scrotal swelling. Negative for discharge, dysuria, frequency, hematuria, penile pain, penile swelling and testicular pain.  Musculoskeletal: Negative for back pain and gait problem.  Skin: Negative for rash.  All other systems reviewed and are negative.   Per HPI unless specifically indicated above   Allergies as of 05/22/2018      Reactions   Lipitor [atorvastatin]    Muscles aches      Medication List       Accurate as of May 22, 2018  3:47  PM. Always use your most recent med list.        aspirin EC 81 MG tablet Take 1 tablet (81 mg total) by mouth daily.   clopidogrel 75 MG tablet Commonly known as:  PLAVIX Take 1 tablet (75 mg total) by mouth daily.   diclofenac sodium 1 % Gel Commonly known as:  VOLTAREN APPLY 2 GRAMS TOPICALLY 4 TIMES DAILY   levothyroxine 75 MCG tablet Commonly known as:  SYNTHROID, LEVOTHROID Take 1 tablet (75 mcg total) by mouth daily before breakfast.   nitroGLYCERIN 0.4 MG SL tablet Commonly known as:  NITROSTAT Place 1 tablet (0.4 mg total) under the tongue every 5 (five) minutes as needed for chest pain. X 3 doses   rosuvastatin 40 MG tablet Commonly known as:  CRESTOR Take 1 tablet (40 mg total) by mouth daily.   SUMAtriptan 25 MG tablet Commonly known as:  IMITREX TAKE 1 TABLET BY MOUTH EVERY 2 HOURS AS NEEDED MIGRAINE  MAY  REPEAT  IN  2  HOURS  IF  HEADACHE  PERSISTS  OR  RECURS   topiramate 25 MG tablet Commonly known as:  Topamax Take 1 tablet (25 mg total) by mouth 2 (two) times daily.        Objective:   BP 139/72    Pulse (!) 56    Temp (!) 97.3 F (36.3 C) (Oral)  Ht _0  (1.727 m)    Wt 161 lb 12.8 oz (73.4 kg)    BMI 24.60 kg/m   Wt Readings from Last 3 Encounters:  05/22/18 161 lb 12.8 oz (73.4 kg)  03/09/18 163 lb 6.4 oz (74.1 kg)  01/18/18 162 lb 3.2 oz (73.6 kg)    Physical Exam Vitals signs and nursing note reviewed.  Constitutional:      General: He is not in acute distress.    Appearance: He is well-developed. He is not diaphoretic.  Eyes:     General: No scleral icterus.    Conjunctiva/sclera: Conjunctivae normal.  Neck:     Thyroid: No thyromegaly.  Cardiovascular:     Rate and Rhythm: Normal rate and regular rhythm.     Heart sounds: Normal heart sounds. No murmur.  Pulmonary:     Effort: Pulmonary effort is normal. No respiratory distress.     Breath sounds: Normal breath sounds. No wheezing.  Abdominal:     General: Abdomen is flat.  Bowel sounds are normal. There is no distension.     Palpations: There is no mass.     Tenderness: There is abdominal tenderness. There is no right CVA tenderness, left CVA tenderness, guarding or rebound.     Hernia: No hernia is present. There is no hernia in the right inguinal area or left inguinal area.  Genitourinary:    Penis: No tenderness or discharge.      Scrotum/Testes:        Right: Tenderness present. Mass, swelling, testicular hydrocele or varicocele not present.        Left: Tenderness present. Mass, swelling, testicular hydrocele or varicocele not present.     Epididymis:     Right: Normal.     Left: Normal.     Comments: Tenderness in the inguinal region bilaterally and suprapubic tenderness Musculoskeletal: Normal range of motion.  Lymphadenopathy:     Lower Body: No right inguinal adenopathy. No left inguinal adenopathy.  Skin:    General: Skin is warm and dry.     Findings: No rash.  Neurological:     Mental Status: He is alert and oriented to person, place, and time.     Coordination: Coordination normal.  Psychiatric:        Behavior: Behavior normal.     Urinalysis: Clear no sign of infection  Assessment & Plan:   Problem List Items Addressed This Visit    None    Visit Diagnoses    Suprapubic tenderness    -  Primary   Relevant Orders   Urinalysis, Complete   Urine Culture   PSA, total and free   CBC with Differential/Platelet   CMP14+EGFR   Urinary retention           Called Urology and patient has an appt tomorrow 3/24 at 8 am with Jinny [place] nurse practitioner with urology Follow up plan: Return if symptoms worsen or Olson to improve.  Counseling provided for all of the vaccine components Orders Placed This Encounter  Procedures   Urine Culture   Urinalysis, Complete   PSA, total and free   CBC with Differential/Platelet   CMP14+EGFR    Caryl Pina, MD [facility] 05/22/2018, 3:47 PM

## 2018-05-23 DIAGNOSIS — N411 Chronic prostatitis: Secondary | ICD-10-CM | POA: Diagnosis not present

## 2018-05-23 LAB — URINE CULTURE: Organism ID, Bacteria: NO GROWTH

## 2018-05-24 ENCOUNTER — Telehealth: Payer: Self-pay | Admitting: Family Medicine

## 2018-05-24 NOTE — Telephone Encounter (Signed)
Patient aware and verbalizes understanding- states he will call pharmacy.

## 2018-05-24 NOTE — Telephone Encounter (Signed)
Patient states he did get Imitrex, but needs refill and lost bottle.  Advised patient he can call pharmacy and they can look up prescription without prescription number.

## 2018-05-24 NOTE — Telephone Encounter (Signed)
It looks like I did send in the sumatriptan, did he not get it, it was called in February 20

## 2018-05-24 NOTE — Telephone Encounter (Signed)
Patient seen 05/22/2018 by Dr. Warrick Parisian.  Will forward to him to advise on migraine medication.

## 2018-06-12 ENCOUNTER — Telehealth: Payer: Self-pay | Admitting: Cardiology

## 2018-06-12 NOTE — Telephone Encounter (Addendum)
C/o intermittent pressure/chest pain rated 3-4/10 that started 2-3 days ago with sob with activity. Active chest pain rated 2-3/10 and sob. Denies n/v, coughing, congestion, fever, swelling. Denies lifting or pulling anything heavy. Has not used nitroglycerin. Offered virtual visit and patient agrees to a telephone call since he doesn't have a cell phone. Virtual visit scheduled for 06/13/2018 @4 :00 pm with Domenic Polite. Advised if symptoms get worse, to go to the ED for an evaluation. Verbalized understanding.   Patient verbally consented for a telehealth visits with [facility] and understands that her insurance company will be billed for the encounter.

## 2018-06-12 NOTE — Telephone Encounter (Signed)
Medications and allergies reviewed with patient  Unable to check BP, HR and weight prior to visit  No new lab work or hospitalizations since last visit

## 2018-06-12 NOTE — Progress Notes (Deleted)
{Choose 1 Note Type (Telehealth Visit or Telephone Visit):[phone]}   Evaluation Performed:  Follow-up visit  Date:  06/12/2018   ID:  Keith Toniann Fail., DOB [birth year], MRN [id number]  Patient Location: Home  Provider Location: Home  PCP:  Dettinger, Fransisca Kaufmann, MD  Cardiologist:  Rozann Lesches, MD  Chief Complaint:  ***  History of Present Illness:    Keith Olson. is a 78 y.o. male who presents via audio/video conferencing for a telehealth visit today.  He was last seen in July 2019.  Recent call to the office noted with report of chest pain and dyspnea on exertion, this visit was scheduled by Ms. Anderson.  Most recent coronary intervention was placement of DES to the mid LAD distal to prior stent sites in the setting of unstable angina back in July 2019.  The patient {does/does not:200015} have symptoms concerning for COVID-19 infection (fever, chills, cough, or new shortness of breath).    Past Medical History:  Diagnosis Date  . Agent orange exposure   . Arthritis   . Coronary artery disease    BMS to mid LAD 2001, DES to proximal LAD 2017  . Enlarged prostate    XRT 2016  . Headache    Sinus headaches   . History of depression   . Hyperlipidemia   . Hypothyroidism   . Prostate cancer ([place]) 07/2014   hormonal therapy, external beam radiation therapy  . PTSD (post-traumatic stress disorder)    Past Surgical History:  Procedure Laterality Date  . CARDIAC CATHETERIZATION N/A 12/12/2015   Procedure: Left Heart Cath and Coronary Angiography;  Surgeon: Peter M Martinique, MD;  Location: [facility];  Service: Cardiovascular;  Laterality: N/A;  . CARDIAC CATHETERIZATION N/A 12/12/2015   Procedure: Coronary Stent Intervention;  Surgeon: Peter M Martinique, MD;  Location: [facility];  Service: Cardiovascular;  Laterality: N/A;  . CORONARY STENT INTERVENTION N/A 09/06/2017   Procedure: CORONARY STENT INTERVENTION;  Surgeon: Burnell Blanks, MD;  Location:  [facility];  Service: Cardiovascular;  Laterality: N/A;  . HERNIA REPAIR Right   . LEFT HEART CATH AND CORONARY ANGIOGRAPHY N/A 09/02/2016   Procedure: Left Heart Cath and Coronary Angiography;  Surgeon: Leonie Man, MD;  Location: [facility];  Service: Cardiovascular;  Laterality: N/A;  . LEFT HEART CATH AND CORONARY ANGIOGRAPHY N/A 09/06/2017   Procedure: LEFT HEART CATH AND CORONARY ANGIOGRAPHY;  Surgeon: Burnell Blanks, MD;  Location: [facility];  Service: Cardiovascular;  Laterality: N/A;  . PROSTATE BIOPSY N/A 08/28/2014   Procedure: BIOPSY TRANSRECTAL ULTRASONIC PROSTATE (TUBP);  Surgeon: Rana Snare, MD;  Location: [facility];  Service: Urology;  Laterality: N/A;  . TRANSURETHRAL RESECTION OF PROSTATE N/A 08/28/2014   Procedure: TRANSURETHRAL RESECTION OF THE PROSTATE WITH GYRUS INSTRUMENTS;  Surgeon: Rana Snare, MD;  Location: [facility];  Service: Urology;  Laterality: N/A;     No outpatient medications have been marked as taking for the 06/13/18 encounter (Appointment) with Satira Sark, MD.     Allergies:   Lipitor [atorvastatin]   Social History   Tobacco Use  . Smoking status: Never Smoker  . Smokeless tobacco: Former Systems developer    Types: Chew  Substance Use Topics  . Alcohol use: No  . Drug use: No     Family Hx: The patient's family history includes Arthritis in his mother; Heart attack in his father.  ROS:   Please see the history of present illness.    ***  All other systems reviewed and are negative.   Prior CV studies:   The following studies were reviewed today:  Cardiac catheterization 09/06/2017:  Prox RCA lesion is 40% stenosed.  Dist RCA lesion is 40% stenosed.  Mid RCA lesion is 30% stenosed.  RPDA lesion is 50% stenosed.  Prox Cx lesion is 20% stenosed.  Ost LAD to Prox LAD lesion is 40% stenosed.  Prox LAD to Mid LAD lesion is 95% stenosed.  A drug-eluting stent was successfully placed using a STENT SYNERGY DES  [id number].  Post intervention, there is a 0% residual stenosis.  Post intervention, there is a 0% residual stenosis.  The left ventricular systolic function is normal.  LV end diastolic pressure is normal.  The left ventricular ejection fraction is 55-65% by visual estimate.  There is no mitral valve regurgitation.  1. Patent proximal LAD stents with mild to moderate restenosis. Severe stenosis mid LAD beyond the old stents.  2. Successful PTCA/DES x 1 proximal and mid LAD 3. Moderate non-obstructive disease in the RCA 4. Normal LV systolic function  Echocardiogram 09/06/2017: Study Conclusions  - Left ventricle: The cavity size was normal. Wall thickness was increased increased in a pattern of mild to moderate LVH. Systolic function was normal. The estimated ejection fraction was in the range of 60% to 65%. Wall motion was normal; there were no regional wall motion abnormalities. Doppler parameters are consistent with abnormal left ventricular relaxation (grade 1 diastolic dysfunction). - Aortic valve: There was mild regurgitation. Valve area (VTI): 3.91 cm^2. Valve area (Vmax): 3.45 cm^2. Valve area (Vmean): 3.68 cm^2. - Technically adequate study.  Labs/Other Tests and Data Reviewed:    EKG: I personally reviewed his ECG from 09/08/2017 which showed sinus rhythm with leftward axis and borderline low voltage.  Recent Labs: 09/05/2017: B Natriuretic Peptide 16.0; Magnesium 2.4 01/18/2018: ALT 39; BUN 9; Creatinine, Ser 0.77; Hemoglobin 14.6; Platelets 254; Potassium 4.5; Sodium 141; TSH 0.465   Recent Lipid Panel Lab Results  Component Value Date/Time   CHOL 204 (H) 09/07/2017 02:23 AM   TRIG 184 (H) 09/07/2017 02:23 AM   HDL 25 (L) 09/07/2017 02:23 AM   CHOLHDL 8.2 09/07/2017 02:23 AM   LDLCALC 142 (H) 09/07/2017 02:23 AM    Wt Readings from Last 3 Encounters:  05/22/18 161 lb 12.8 oz (73.4 kg)  03/09/18 163 lb 6.4 oz (74.1 kg)  01/18/18 162 lb 3.2  oz (73.6 kg)     Objective:    Vital Signs:  There were no vitals taken for this visit.   Well nourished, well developed male in no*** acute distress. ***  ASSESSMENT & PLAN:    1. ***  COVID-19 Education: The signs and symptoms of COVID-19 were discussed with the patient and how to seek care for testing (follow up with PCP or arrange E-visit).  ***The importance of social distancing was discussed today.  Time:   Today, I have spent *** minutes with the patient with telehealth technology discussing the above problems.     Medication Adjustments/Labs and Tests Ordered: Current medicines are reviewed at length with the patient today.  Concerns regarding medicines are outlined above.  Tests Ordered: No orders of the defined types were placed in this encounter.   Medication Changes: No orders of the defined types were placed in this encounter.   Disposition:  Follow up {follow up:15908}  Signed, Rozann Lesches, MD  06/12/2018 1:16 PM    [facility]

## 2018-06-12 NOTE — Telephone Encounter (Signed)
Pt c/o of Chest Pain: 1. Are you having CP right now?  Yes (sale of 1-10) 3  2. Are you experiencing any other symptoms (ex. SOB, nausea, vomiting, sweating)?  Shortness of breath  3. How long have you been experiencing CP? 2 days  4. Is your CP continuous or coming and going? No - tightness in chest  5. Have you taken Nitroglycerin? No

## 2018-06-13 ENCOUNTER — Telehealth: Payer: Medicare Other | Admitting: Cardiology

## 2018-07-17 ENCOUNTER — Other Ambulatory Visit: Payer: Self-pay | Admitting: Family Medicine

## 2018-07-17 DIAGNOSIS — M19012 Primary osteoarthritis, left shoulder: Secondary | ICD-10-CM

## 2018-07-17 DIAGNOSIS — M160 Bilateral primary osteoarthritis of hip: Secondary | ICD-10-CM

## 2018-07-17 DIAGNOSIS — M19011 Primary osteoarthritis, right shoulder: Secondary | ICD-10-CM

## 2018-07-17 DIAGNOSIS — M1812 Unilateral primary osteoarthritis of first carpometacarpal joint, left hand: Secondary | ICD-10-CM

## 2018-08-14 DIAGNOSIS — K121 Other forms of stomatitis: Secondary | ICD-10-CM | POA: Diagnosis not present

## 2018-08-14 DIAGNOSIS — R490 Dysphonia: Secondary | ICD-10-CM | POA: Diagnosis not present

## 2018-09-14 ENCOUNTER — Other Ambulatory Visit: Payer: Self-pay | Admitting: Family Medicine

## 2018-09-14 DIAGNOSIS — M160 Bilateral primary osteoarthritis of hip: Secondary | ICD-10-CM

## 2018-09-14 DIAGNOSIS — M19012 Primary osteoarthritis, left shoulder: Secondary | ICD-10-CM

## 2018-09-14 DIAGNOSIS — M19011 Primary osteoarthritis, right shoulder: Secondary | ICD-10-CM

## 2018-09-14 DIAGNOSIS — M1812 Unilateral primary osteoarthritis of first carpometacarpal joint, left hand: Secondary | ICD-10-CM

## 2018-09-17 ENCOUNTER — Other Ambulatory Visit: Payer: Self-pay | Admitting: Family Medicine

## 2018-09-17 DIAGNOSIS — G43C0 Periodic headache syndromes in child or adult, not intractable: Secondary | ICD-10-CM

## 2018-10-06 ENCOUNTER — Other Ambulatory Visit: Payer: Self-pay | Admitting: Family Medicine

## 2018-10-06 DIAGNOSIS — G43C0 Periodic headache syndromes in child or adult, not intractable: Secondary | ICD-10-CM

## 2018-10-06 MED ORDER — SUMATRIPTAN SUCCINATE 25 MG PO TABS: ORAL_TABLET | ORAL | 0 refills | Status: DC

## 2018-10-06 NOTE — Addendum Note (Signed)
Addended by: Wardell Heath on: 10/06/2018 04:46 PM   Modules accepted: Orders

## 2018-10-13 ENCOUNTER — Other Ambulatory Visit: Payer: Self-pay | Admitting: Family Medicine

## 2018-10-13 DIAGNOSIS — M160 Bilateral primary osteoarthritis of hip: Secondary | ICD-10-CM

## 2018-10-13 DIAGNOSIS — M19012 Primary osteoarthritis, left shoulder: Secondary | ICD-10-CM

## 2018-10-13 DIAGNOSIS — M1812 Unilateral primary osteoarthritis of first carpometacarpal joint, left hand: Secondary | ICD-10-CM

## 2018-10-13 DIAGNOSIS — M19011 Primary osteoarthritis, right shoulder: Secondary | ICD-10-CM

## 2018-10-31 ENCOUNTER — Ambulatory Visit: Payer: Medicare Other | Admitting: Urology

## 2018-11-03 ENCOUNTER — Other Ambulatory Visit: Payer: Self-pay

## 2018-11-03 ENCOUNTER — Ambulatory Visit: Payer: Medicare Other | Admitting: Urology

## 2018-11-03 DIAGNOSIS — C61 Malignant neoplasm of prostate: Secondary | ICD-10-CM | POA: Diagnosis not present

## 2018-11-10 ENCOUNTER — Other Ambulatory Visit: Payer: Self-pay

## 2018-11-13 ENCOUNTER — Other Ambulatory Visit: Payer: Self-pay

## 2018-11-13 ENCOUNTER — Ambulatory Visit (INDEPENDENT_AMBULATORY_CARE_PROVIDER_SITE_OTHER): Payer: Medicare Other | Admitting: Family Medicine

## 2018-11-13 ENCOUNTER — Encounter: Payer: Self-pay | Admitting: Family Medicine

## 2018-11-13 VITALS — BP 138/73 | HR 58 | Temp 97.3°F | Resp 18 | Ht 68.0 in | Wt 152.2 lb

## 2018-11-13 DIAGNOSIS — Z8659 Personal history of other mental and behavioral disorders: Secondary | ICD-10-CM

## 2018-11-13 DIAGNOSIS — E039 Hypothyroidism, unspecified: Secondary | ICD-10-CM

## 2018-11-13 DIAGNOSIS — C61 Malignant neoplasm of prostate: Secondary | ICD-10-CM

## 2018-11-13 DIAGNOSIS — R251 Tremor, unspecified: Secondary | ICD-10-CM

## 2018-11-13 MED ORDER — DULOXETINE HCL 30 MG PO CPEP: 30.0000 mg | ORAL_CAPSULE | Freq: Every day | ORAL | 0 refills | Status: DC

## 2018-11-13 NOTE — Progress Notes (Signed)
Subjective:  Patient ID: Keith Olson., male    DOB: May 02, 1940  Age: 78 y.o. MRN: [id number]  CC: Anxiety (x 2- 72month and states it is getting worse)   HPI English Keith Olson presents for severe anxiety as a result of PTSD. It is worsening so much that he feels he is shaking inside. He is having tremors in the hands. [place]orsened recently with diagnosis of Ca prostate. He also has been off of his thyroid medication for 6 months.  [facility]/13/2018 01/18/2018 12/21/2017  Decreased Interest '3 1 2  '$ Down, Depressed, Hopeless '3 1 2  '$ PHQ - 2 Score '6 2 4  '$ Altered sleeping 0 0 0  Tired, decreased energy '3 1 1  '$ Change in appetite 1 0 0  Feeling bad or failure about yourself  2 0 0  Trouble concentrating 3 0 1  Moving slowly or fidgety/restless 0 0 1  Suicidal thoughts 1 0 0  PHQ-9 Score '16 3 7  '$ Difficult doing work/chores Not difficult at all - -  Some recent data might be hidden    History Keith Olson has a past medical history of Agent orange exposure, Arthritis, Coronary artery disease, Enlarged prostate, Headache, History of depression, Hyperlipidemia, Hypothyroidism, [place] and PTSD (post-traumatic stress disorder).   He has a past surgical history that includes Hernia repair (Right); Transurethral resection of prostate (N/A, 08/28/2014); Prostate biopsy (N/A, 08/28/2014); Cardiac catheterization (N/A, 12/12/2015); Cardiac catheterization (N/A, 12/12/2015); LEFT HEART CATH AND CORONARY ANGIOGRAPHY (N/A, 09/02/2016); LEFT HEART CATH AND CORONARY ANGIOGRAPHY (N/A, 09/06/2017); and CORONARY STENT INTERVENTION (N/A, 09/06/2017).   His family history includes Arthritis in his mother; Heart attack in his father.He reports that he has never smoked. He has quit using smokeless tobacco.  His smokeless tobacco use included chew. He reports that he does not drink alcohol or use drugs.    ROS Review of Systems   Constitutional: Positive for fatigue.  HENT: Negative.   Eyes: Negative for visual disturbance.  Respiratory: Negative for cough and shortness of breath.   Cardiovascular: Negative for chest pain and leg swelling.  Gastrointestinal: Negative for abdominal pain, diarrhea, nausea and vomiting.  Genitourinary: Negative for difficulty urinating.  Musculoskeletal: Negative for arthralgias and myalgias.  Skin: Negative for rash.  Neurological: Positive for tremors. Negative for headaches.  Psychiatric/Behavioral: Positive for agitation. Negative for sleep disturbance. The patient is nervous/anxious.     Objective:  BP 138/73   Pulse (!) 58   Temp (!) 97.3 F (36.3 C) (Temporal)   Resp 18   Ht '5\' 8"'$  (1.727 m)   Wt 152 lb 3.2 oz (69 kg)   SpO2 97%   BMI 23.14 kg/m   BP Readings from Last 3 Encounters:  11/13/18 138/73  05/22/18 139/72  03/09/18 140/70    Wt Readings from Last 3 Encounters:  11/13/18 152 lb 3.2 oz (69 kg)  05/22/18 161 lb 12.8 oz (73.4 kg)  03/09/18 163 lb 6.4 oz (74.1 kg)     Physical Exam Constitutional:      General: He is not in acute distress.    Appearance: He is well-developed.  HENT:     Head: Normocephalic and atraumatic.     Right Ear: External ear normal.     Left Ear: External ear normal.     Nose: Nose normal.  Eyes:     Conjunctiva/sclera: Conjunctivae normal.     Pupils: Pupils are equal, round, and reactive  to light.  Neck:     Musculoskeletal: Normal range of motion and neck supple.  Cardiovascular:     Rate and Rhythm: Normal rate and regular rhythm.     Heart sounds: Normal heart sounds. No murmur.  Pulmonary:     Effort: Pulmonary effort is normal. No respiratory distress.     Breath sounds: Normal breath sounds. No wheezing or rales.  Abdominal:     Palpations: Abdomen is soft.     Tenderness: There is no abdominal tenderness.  Musculoskeletal: Normal range of motion.  Skin:    General: Skin is warm and dry.  Neurological:      Mental Status: He is alert and oriented to person, place, and time.     Motor: Tremor (both hands, rest and with movement.) present.     Coordination: Coordination abnormal.     Deep Tendon Reflexes: Reflexes are normal and symmetric.  Psychiatric:        Thought Content: Thought content normal.        Judgment: Judgment normal.       Assessment & Plan:   Keith Olson was seen today for anxiety.  Diagnoses and all orders for this visit:  Tremor -     CBC with Differential/Platelet -     CMP14+EGFR -     TSH -     T4, Free  Acquired hypothyroidism  Malignant neoplasm of prostate ([place])  History of depression  Other orders -     DULoxetine (CYMBALTA) 30 MG capsule; Take 1 capsule (30 mg total) by mouth daily. For one week then two daily. Take with a full stomach at suppertime       I have discontinued Keith Wanamaker Jr.'s doxycycline. I am also having him start on DULoxetine. Additionally, I am having him maintain his aspirin EC, nitroGLYCERIN, rosuvastatin, clopidogrel, levothyroxine, topiramate, acetaminophen-codeine, tamsulosin, SUMAtriptan, and diclofenac sodium.  Allergies as of 11/13/2018      Reactions   Lipitor [atorvastatin]    Muscles aches      Medication List       Accurate as of November 13, 2018 11:59 PM. If you have any questions, ask your nurse or doctor.        STOP taking these medications   doxycycline 100 MG capsule Commonly known as: VIBRAMYCIN Stopped by: Claretta Fraise, MD     TAKE these medications   acetaminophen-codeine 300-30 MG tablet Commonly known as: TYLENOL #3 Take 1 tablet by mouth every 6 (six) hours as needed for moderate pain.   aspirin EC 81 MG tablet Take 1 tablet (81 mg total) by mouth daily.   clopidogrel 75 MG tablet Commonly known as: PLAVIX Take 1 tablet (75 mg total) by mouth daily.   diclofenac sodium 1 % Gel Commonly known as: VOLTAREN APPLY 2 GRAMS TOPICALLY FOUR TIMES DAILY   DULoxetine 30 MG capsule  Commonly known as: Cymbalta Take 1 capsule (30 mg total) by mouth daily. For one week then two daily. Take with a full stomach at suppertime Started by: Claretta Fraise, MD   levothyroxine 75 MCG tablet Commonly known as: SYNTHROID Take 1 tablet (75 mcg total) by mouth daily before breakfast.   nitroGLYCERIN 0.4 MG SL tablet Commonly known as: NITROSTAT Place 1 tablet (0.4 mg total) under the tongue every 5 (five) minutes as needed for chest pain. X 3 doses   rosuvastatin 40 MG tablet Commonly known as: CRESTOR Take 1 tablet (40 mg total) by mouth daily.   SUMAtriptan 25 MG  tablet Commonly known as: IMITREX TAKE 1 TABLET BY MOUTH EVERY 2 HOURS AS NEEDED FOR MIGRAINESMAY REPEAT IN 2 HOURS IF HEADACHE PERSISTS OR RECURS   tamsulosin 0.4 MG Caps capsule Commonly known as: FLOMAX Take 0.4 mg by mouth at bedtime.   topiramate 25 MG tablet Commonly known as: Topamax Take 1 tablet (25 mg total) by mouth 2 (two) times daily.        Follow-up: No follow-ups on file.  Claretta Fraise, M.D.

## 2018-11-14 LAB — CMP14+EGFR
ALT: 23 IU/L (ref 0–44)
AST: 27 IU/L (ref 0–40)
Albumin/Globulin Ratio: 1.8 (ref 1.2–2.2)
Albumin: 4.8 g/dL — ABNORMAL HIGH (ref 3.7–4.7)
Alkaline Phosphatase: 63 IU/L (ref 39–117)
BUN/Creatinine Ratio: 10 (ref 10–24)
BUN: 11 mg/dL (ref 8–27)
Bilirubin Total: 0.3 mg/dL (ref 0.0–1.2)
CO2: 19 mmol/L — ABNORMAL LOW (ref 20–29)
Calcium: 9.4 mg/dL (ref 8.6–10.2)
Chloride: 103 mmol/L (ref 96–106)
Creatinine, Ser: 1.09 mg/dL (ref 0.76–1.27)
GFR calc Af Amer: 76 mL/min/{1.73_m2} (ref >59)
GFR calc non Af Amer: 66 mL/min/{1.73_m2} (ref >59)
Globulin, Total: 2.6 g/dL (ref 1.5–4.5)
Glucose: 81 mg/dL (ref 65–99)
Potassium: 4.8 mmol/L (ref 3.5–5.2)
Sodium: 138 mmol/L (ref 134–144)
Total Protein: 7.4 g/dL (ref 6.0–8.5)

## 2018-11-14 LAB — CBC WITH DIFFERENTIAL/PLATELET
Basophils Absolute: 0.1 10*3/uL (ref 0.0–0.2)
Basos: 1 %
EOS (ABSOLUTE): 0.2 10*3/uL (ref 0.0–0.4)
Eos: 3 %
Hematocrit: 41.3 % (ref 37.5–51.0)
Hemoglobin: 14.2 g/dL (ref 13.0–17.7)
Immature Grans (Abs): 0 10*3/uL (ref 0.0–0.1)
Immature Granulocytes: 0 %
Lymphocytes Absolute: 2 10*3/uL (ref 0.7–3.1)
Lymphs: 35 %
MCH: 30.8 pg (ref 26.6–33.0)
MCHC: 34.4 g/dL (ref 31.5–35.7)
MCV: 90 fL (ref 79–97)
Monocytes Absolute: 0.5 10*3/uL (ref 0.1–0.9)
Monocytes: 9 %
Neutrophils Absolute: 2.9 10*3/uL (ref 1.4–7.0)
Neutrophils: 52 %
Platelets: 260 10*3/uL (ref 150–450)
RBC: 4.61 x10E6/uL (ref 4.14–5.80)
RDW: 13.3 % (ref 11.6–15.4)
WBC: 5.6 10*3/uL (ref 3.4–10.8)

## 2018-11-14 LAB — TSH: TSH: 2.85 u[IU]/mL (ref 0.450–4.500)

## 2018-11-14 LAB — T4, FREE: Free T4: 0.94 ng/dL (ref 0.82–1.77)

## 2018-11-14 NOTE — Progress Notes (Signed)
Hello Aloysuis,  Your lab result is normal and/or stable.Some minor variations that are not significant are commonly marked abnormal, but do not represent any medical problem for you.  Best regards, Claretta Fraise, M.D.

## 2018-11-20 ENCOUNTER — Encounter: Payer: Self-pay | Admitting: Family Medicine

## 2018-12-08 ENCOUNTER — Other Ambulatory Visit: Payer: Self-pay | Admitting: Family Medicine

## 2018-12-08 ENCOUNTER — Ambulatory Visit: Payer: Medicare Other | Admitting: Urology

## 2018-12-08 DIAGNOSIS — G43C0 Periodic headache syndromes in child or adult, not intractable: Secondary | ICD-10-CM

## 2018-12-12 ENCOUNTER — Ambulatory Visit (INDEPENDENT_AMBULATORY_CARE_PROVIDER_SITE_OTHER): Payer: Medicare Other | Admitting: Family Medicine

## 2018-12-12 NOTE — Progress Notes (Signed)
Attempted to call patient, left a voicemail

## 2018-12-20 ENCOUNTER — Other Ambulatory Visit: Payer: Self-pay | Admitting: Family Medicine

## 2018-12-20 DIAGNOSIS — M1812 Unilateral primary osteoarthritis of first carpometacarpal joint, left hand: Secondary | ICD-10-CM

## 2018-12-20 DIAGNOSIS — M19011 Primary osteoarthritis, right shoulder: Secondary | ICD-10-CM

## 2018-12-20 DIAGNOSIS — M160 Bilateral primary osteoarthritis of hip: Secondary | ICD-10-CM

## 2018-12-20 DIAGNOSIS — M19012 Primary osteoarthritis, left shoulder: Secondary | ICD-10-CM

## 2019-01-24 DIAGNOSIS — M130 Polyarthritis, unspecified: Secondary | ICD-10-CM | POA: Diagnosis not present

## 2019-05-03 ENCOUNTER — Emergency Department (HOSPITAL_COMMUNITY): Payer: Medicare Other

## 2019-05-03 ENCOUNTER — Encounter (HOSPITAL_COMMUNITY): Payer: Self-pay | Admitting: Emergency Medicine

## 2019-05-03 ENCOUNTER — Telehealth: Payer: Self-pay | Admitting: Cardiology

## 2019-05-03 ENCOUNTER — Inpatient Hospital Stay (HOSPITAL_COMMUNITY)
Admission: EM | Admit: 2019-05-03 | Discharge: 2019-05-04 | DRG: 251 | Disposition: A | Discharge status: Home / Self Care | Payer: Medicare Other | Attending: Cardiovascular Disease | Admitting: Cardiovascular Disease

## 2019-05-03 ENCOUNTER — Other Ambulatory Visit: Payer: Self-pay

## 2019-05-03 DIAGNOSIS — I2510 Atherosclerotic heart disease of native coronary artery without angina pectoris: Secondary | ICD-10-CM | POA: Diagnosis present

## 2019-05-03 DIAGNOSIS — E039 Hypothyroidism, unspecified: Secondary | ICD-10-CM | POA: Diagnosis not present

## 2019-05-03 DIAGNOSIS — Z9079 Acquired absence of other genital organ(s): Secondary | ICD-10-CM

## 2019-05-03 DIAGNOSIS — M1990 Unspecified osteoarthritis, unspecified site: Secondary | ICD-10-CM | POA: Diagnosis present

## 2019-05-03 DIAGNOSIS — Z79899 Other long term (current) drug therapy: Secondary | ICD-10-CM

## 2019-05-03 DIAGNOSIS — Z8546 Personal history of malignant neoplasm of prostate: Secondary | ICD-10-CM

## 2019-05-03 DIAGNOSIS — Z8261 Family history of arthritis: Secondary | ICD-10-CM

## 2019-05-03 DIAGNOSIS — I25110 Atherosclerotic heart disease of native coronary artery with unstable angina pectoris: Secondary | ICD-10-CM | POA: Diagnosis not present

## 2019-05-03 DIAGNOSIS — F4310 Post-traumatic stress disorder, unspecified: Secondary | ICD-10-CM | POA: Diagnosis present

## 2019-05-03 DIAGNOSIS — I200 Unstable angina: Secondary | ICD-10-CM | POA: Diagnosis not present

## 2019-05-03 DIAGNOSIS — Z955 Presence of coronary angioplasty implant and graft: Secondary | ICD-10-CM | POA: Diagnosis not present

## 2019-05-03 DIAGNOSIS — Z8249 Family history of ischemic heart disease and other diseases of the circulatory system: Secondary | ICD-10-CM | POA: Diagnosis not present

## 2019-05-03 DIAGNOSIS — Z923 Personal history of irradiation: Secondary | ICD-10-CM

## 2019-05-03 DIAGNOSIS — R079 Chest pain, unspecified: Secondary | ICD-10-CM | POA: Diagnosis not present

## 2019-05-03 DIAGNOSIS — F411 Generalized anxiety disorder: Secondary | ICD-10-CM | POA: Diagnosis present

## 2019-05-03 DIAGNOSIS — Z7902 Long term (current) use of antithrombotics/antiplatelets: Secondary | ICD-10-CM | POA: Diagnosis not present

## 2019-05-03 DIAGNOSIS — Z888 Allergy status to other drugs, medicaments and biological substances status: Secondary | ICD-10-CM | POA: Diagnosis not present

## 2019-05-03 DIAGNOSIS — Z7982 Long term (current) use of aspirin: Secondary | ICD-10-CM | POA: Diagnosis not present

## 2019-05-03 DIAGNOSIS — R001 Bradycardia, unspecified: Secondary | ICD-10-CM | POA: Diagnosis not present

## 2019-05-03 DIAGNOSIS — I10 Essential (primary) hypertension: Secondary | ICD-10-CM | POA: Diagnosis not present

## 2019-05-03 DIAGNOSIS — F329 Major depressive disorder, single episode, unspecified: Secondary | ICD-10-CM | POA: Diagnosis present

## 2019-05-03 DIAGNOSIS — Z20822 Contact with and (suspected) exposure to covid-19: Secondary | ICD-10-CM | POA: Diagnosis not present

## 2019-05-03 DIAGNOSIS — Z79890 Hormone replacement therapy (postmenopausal): Secondary | ICD-10-CM

## 2019-05-03 DIAGNOSIS — N400 Enlarged prostate without lower urinary tract symptoms: Secondary | ICD-10-CM | POA: Diagnosis present

## 2019-05-03 DIAGNOSIS — E785 Hyperlipidemia, unspecified: Secondary | ICD-10-CM | POA: Diagnosis not present

## 2019-05-03 LAB — PROTIME-INR
INR: 1.1 (ref 0.8–1.2)
Prothrombin Time: 14.4 seconds (ref 11.4–15.2)

## 2019-05-03 LAB — BASIC METABOLIC PANEL
Anion gap: 8 (ref 5–15)
BUN: 12 mg/dL (ref 8–23)
CO2: 24 mmol/L (ref 22–32)
Calcium: 9.1 mg/dL (ref 8.9–10.3)
Chloride: 105 mmol/L (ref 98–111)
Creatinine, Ser: 0.79 mg/dL (ref 0.61–1.24)
GFR calc Af Amer: >60 mL/min (ref >60)
GFR calc non Af Amer: >60 mL/min (ref >60)
Glucose, Bld: 106 mg/dL — ABNORMAL HIGH (ref 70–99)
Potassium: 3.7 mmol/L (ref 3.5–5.1)
Sodium: 137 mmol/L (ref 135–145)

## 2019-05-03 LAB — CBC
HCT: 43.5 % (ref 39.0–52.0)
Hemoglobin: 14.1 g/dL (ref 13.0–17.0)
MCH: 30.9 pg (ref 26.0–34.0)
MCHC: 32.4 g/dL (ref 30.0–36.0)
MCV: 95.2 fL (ref 80.0–100.0)
Platelets: 233 10*3/uL (ref 150–400)
RBC: 4.57 MIL/uL (ref 4.22–5.81)
RDW: 13 % (ref 11.5–15.5)
WBC: 8.5 10*3/uL (ref 4.0–10.5)
nRBC: 0 % (ref 0.0–0.2)

## 2019-05-03 LAB — HEMOGLOBIN A1C
Hgb A1c MFr Bld: 5.7 % — ABNORMAL HIGH (ref 4.8–5.6)
Mean Plasma Glucose: 116.89 mg/dL

## 2019-05-03 LAB — TROPONIN I (HIGH SENSITIVITY)
Troponin I (High Sensitivity): 2 ng/L (ref <18)
Troponin I (High Sensitivity): 2 ng/L (ref <18)

## 2019-05-03 LAB — APTT: aPTT: 29 seconds (ref 24–36)

## 2019-05-03 IMAGING — DX DG CHEST 2V
2 series · 2 of 2 positions shown · non-contrast
Comparison: Radiograph [DATE]

CLINICAL DATA: Chest pain

EXAM:
CHEST - 2 VIEW

[chest pa]
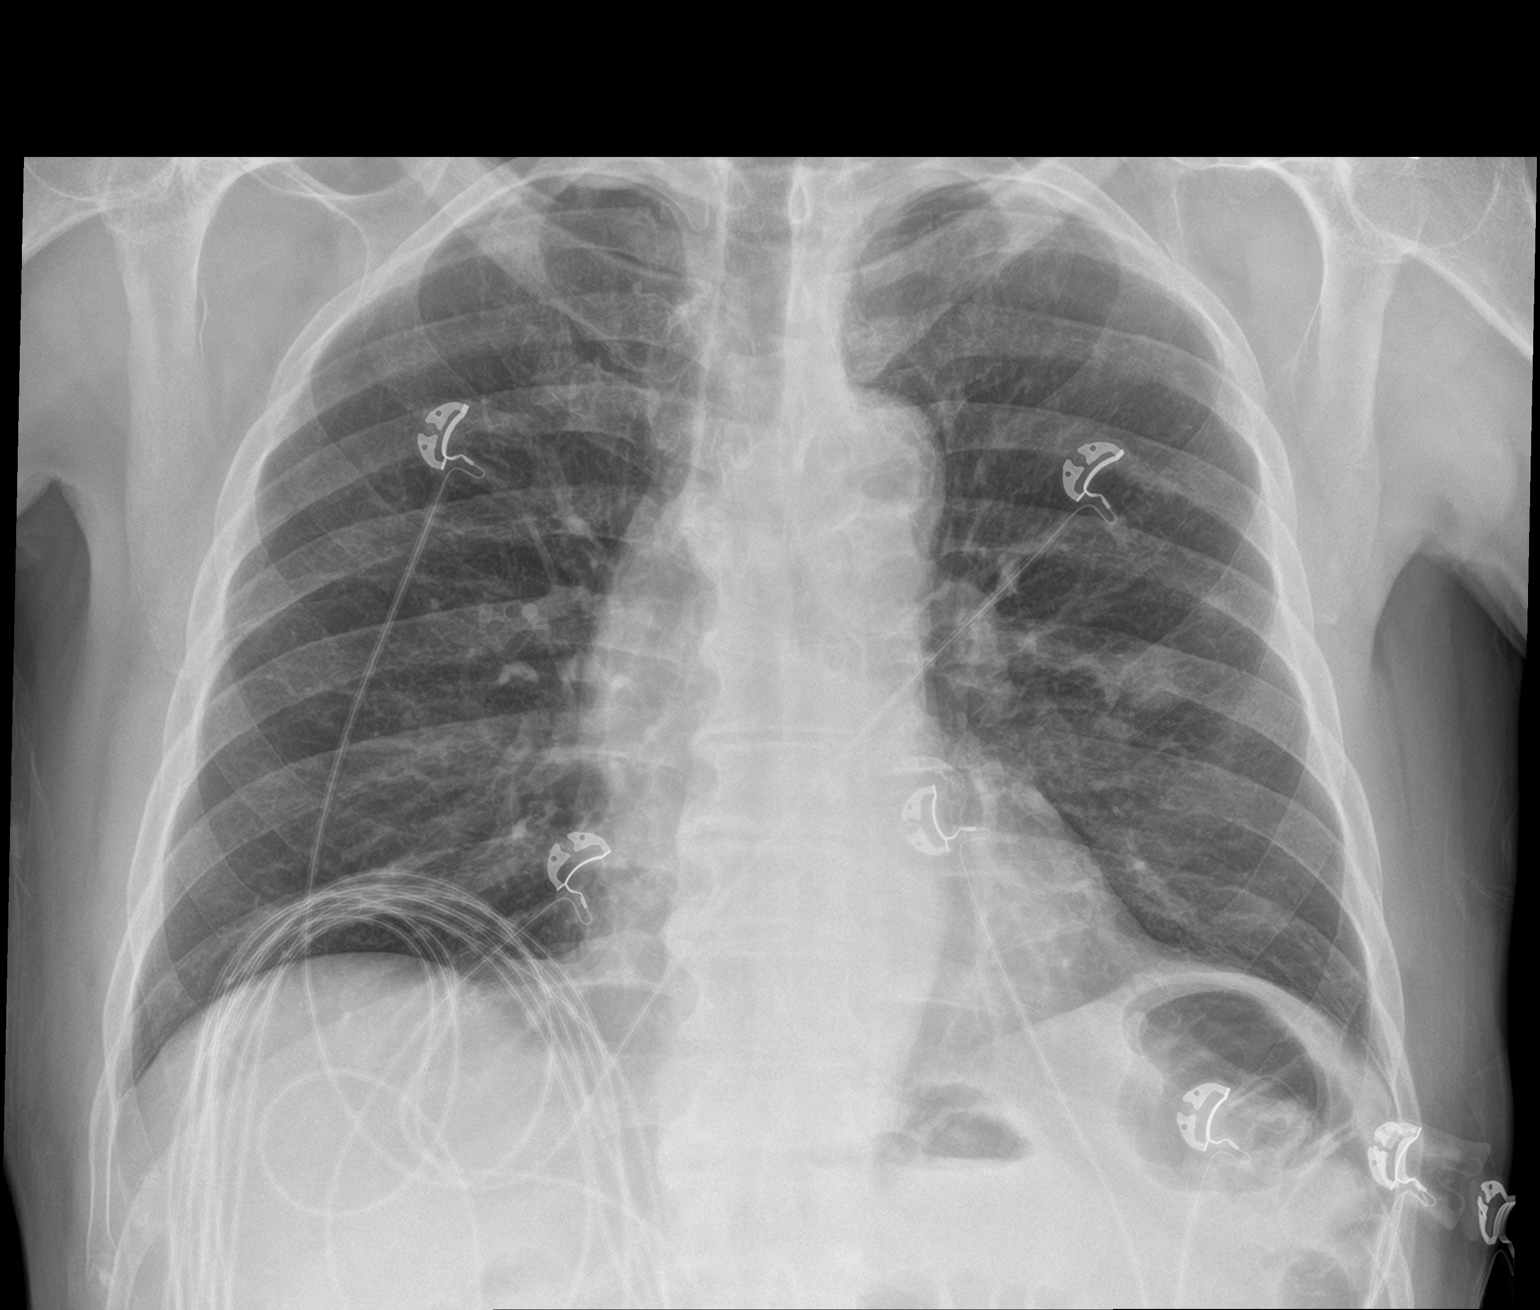

[chest lat]
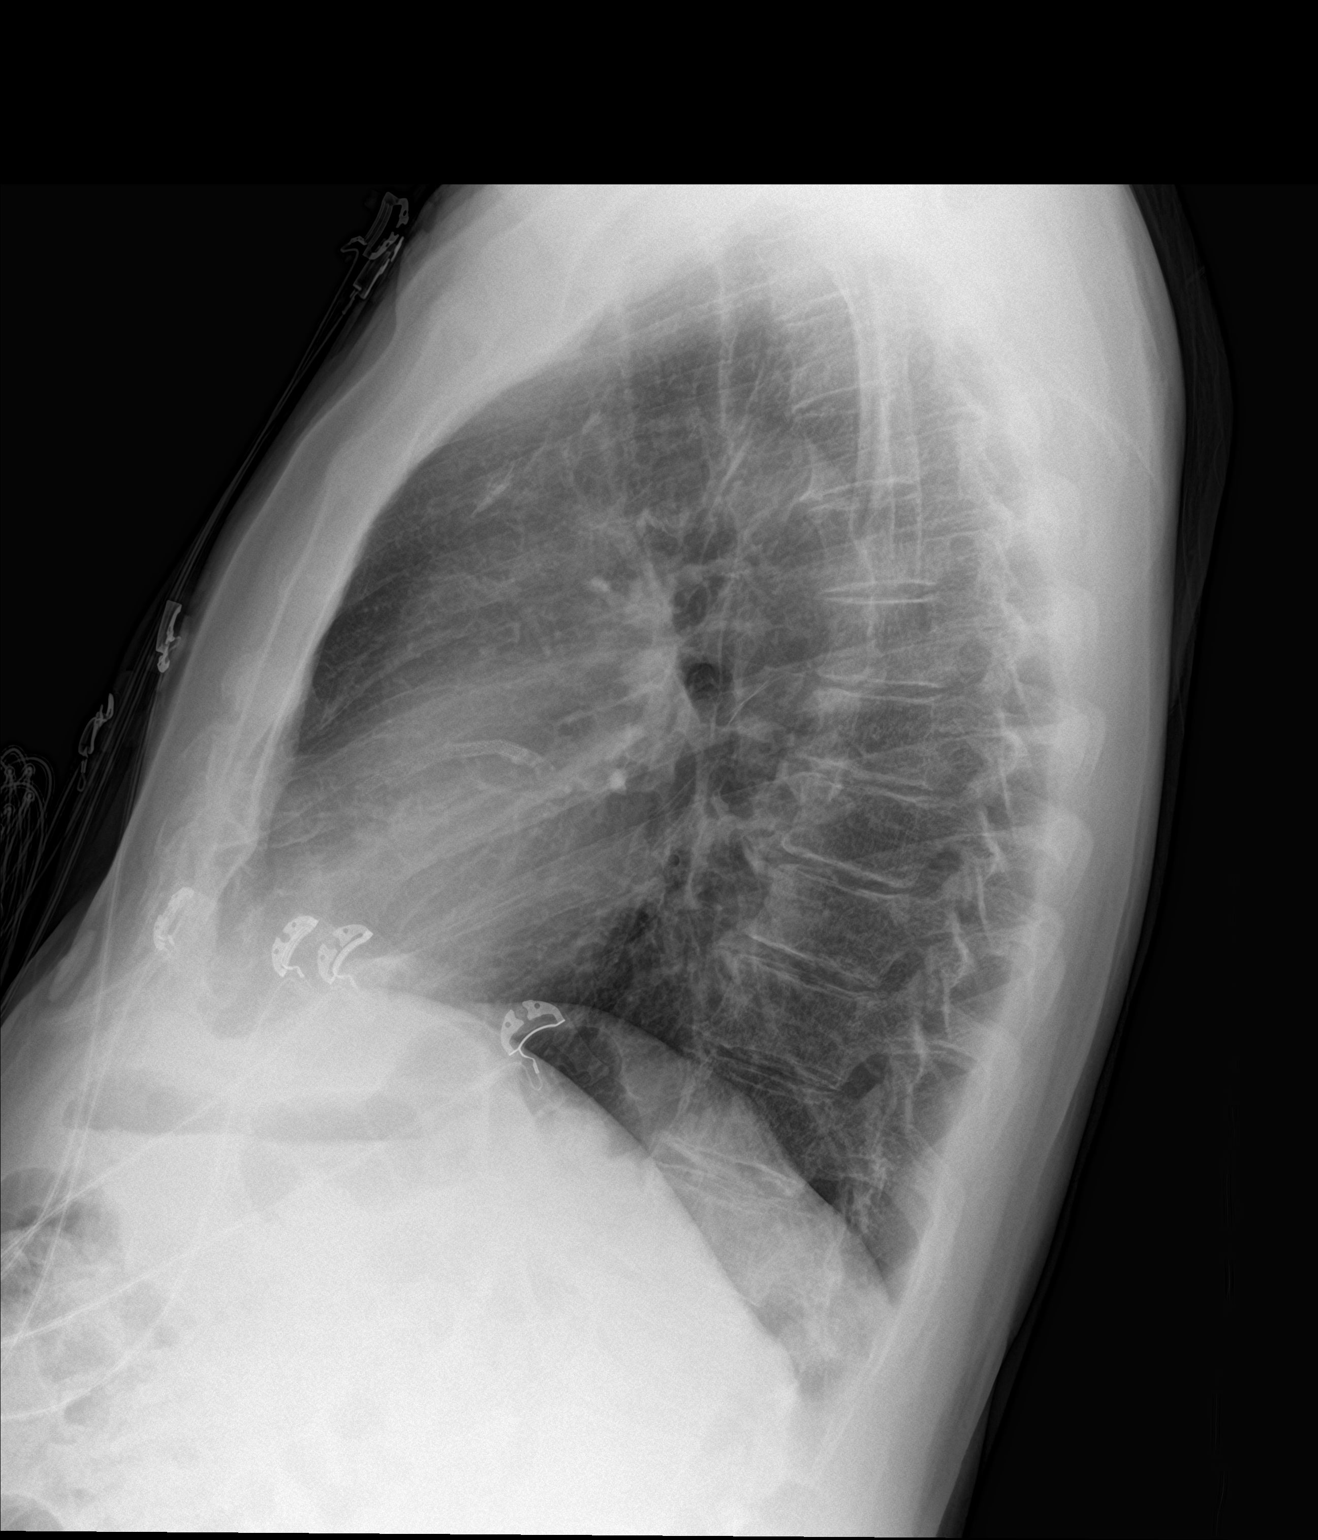

[2 of 2 positions shown; findings below may reference images not displayed]

FINDINGS: No consolidation, features of edema, pneumothorax, or effusion.
Pulmonary vascularity is normally distributed. The aorta is
calcified. Coronary stent projects over the heart on lateral film.
The remaining cardiomediastinal contours are unremarkable. No acute
osseous or soft tissue abnormality. Degenerative changes are present
in the imaged spine and shoulders.
IMPRESSION: No acute cardiopulmonary disease.

## 2019-05-03 MED ORDER — TAMSULOSIN HCL 0.4 MG PO CAPS
0.4000 mg | ORAL_CAPSULE | Freq: Every day | ORAL | Status: DC
  Administered 2019-05-03: 0.4 mg via ORAL
  Filled 2019-05-03: qty 1

## 2019-05-03 MED ORDER — NITROGLYCERIN IN D5W 200-5 MCG/ML-% IV SOLN
5.0000 ug/min | INTRAVENOUS | Status: DC
  Administered 2019-05-03: 5 ug/min via INTRAVENOUS
  Filled 2019-05-03: qty 250

## 2019-05-03 MED ORDER — HEPARIN BOLUS VIA INFUSION
4000.0000 [IU] | Freq: Once | INTRAVENOUS | Status: AC
  Administered 2019-05-03: 4000 [IU] via INTRAVENOUS

## 2019-05-03 MED ORDER — HEPARIN (PORCINE) 25000 UT/250ML-% IV SOLN
1000.0000 [IU]/h | INTRAVENOUS | Status: DC
  Administered 2019-05-03: 1000 [IU]/h via INTRAVENOUS
  Filled 2019-05-03: qty 250

## 2019-05-03 MED ORDER — SODIUM CHLORIDE 0.9% FLUSH: 3.0000 mL | Freq: Once | INTRAVENOUS | Status: DC

## 2019-05-03 MED ORDER — CLOPIDOGREL BISULFATE 75 MG PO TABS
75.0000 mg | ORAL_TABLET | Freq: Every day | ORAL | Status: DC
  Administered 2019-05-04: 75 mg via ORAL
  Filled 2019-05-03: qty 1

## 2019-05-03 MED ORDER — ACETAMINOPHEN 325 MG PO TABS
650.0000 mg | ORAL_TABLET | ORAL | Status: DC | PRN
  Administered 2019-05-04 (×2): 650 mg via ORAL
  Filled 2019-05-03 (×2): qty 2

## 2019-05-03 MED ORDER — ROSUVASTATIN CALCIUM 20 MG PO TABS
40.0000 mg | ORAL_TABLET | Freq: Every day | ORAL | Status: DC
  Administered 2019-05-04: 40 mg via ORAL
  Filled 2019-05-03: qty 2

## 2019-05-03 MED ORDER — LEVOTHYROXINE SODIUM 75 MCG PO TABS
75.0000 ug | ORAL_TABLET | Freq: Every day | ORAL | Status: DC
  Administered 2019-05-04: 75 ug via ORAL
  Filled 2019-05-03: qty 1

## 2019-05-03 MED ORDER — ONDANSETRON HCL 4 MG/2ML IJ SOLN
4.0000 mg | Freq: Four times a day (QID) | INTRAMUSCULAR | Status: DC | PRN
  Administered 2019-05-04: 4 mg via INTRAVENOUS
  Filled 2019-05-03: qty 2

## 2019-05-03 MED ORDER — NITROGLYCERIN 0.4 MG SL SUBL
0.4000 mg | SUBLINGUAL_TABLET | Freq: Once | SUBLINGUAL | Status: AC
  Administered 2019-05-03: 0.4 mg via SUBLINGUAL
  Filled 2019-05-03: qty 1

## 2019-05-03 MED ORDER — ASPIRIN EC 81 MG PO TBEC
81.0000 mg | DELAYED_RELEASE_TABLET | Freq: Every day | ORAL | Status: DC
  Administered 2019-05-04: 81 mg via ORAL
  Filled 2019-05-03: qty 1

## 2019-05-03 MED ORDER — ASPIRIN 81 MG PO CHEW
162.0000 mg | CHEWABLE_TABLET | Freq: Once | ORAL | Status: AC
  Administered 2019-05-03: 162 mg via ORAL
  Filled 2019-05-03: qty 2

## 2019-05-03 MED ORDER — ALPRAZOLAM 0.5 MG PO TABS: 0.5000 mg | ORAL_TABLET | Freq: Two times a day (BID) | ORAL | Status: DC | PRN

## 2019-05-03 MED ORDER — SUMATRIPTAN SUCCINATE 25 MG PO TABS
25.0000 mg | ORAL_TABLET | Freq: Two times a day (BID) | ORAL | Status: DC | PRN
  Administered 2019-05-03: 25 mg via ORAL
  Filled 2019-05-03 (×3): qty 1

## 2019-05-03 MED ORDER — DICLOFENAC SODIUM 1 % TD GEL
2.0000 g | Freq: Four times a day (QID) | TRANSDERMAL | Status: DC | PRN
  Administered 2019-05-04: 2 g via TOPICAL
  Filled 2019-05-03: qty 100

## 2019-05-03 NOTE — ED Notes (Signed)
[place]

## 2019-05-03 NOTE — Progress Notes (Signed)
ANTICOAGULATION CONSULT NOTE - Initial Consult  Pharmacy Consult for heparin Indication: chest pain/ACS  Allergies  Allergen Reactions  . Lipitor [Atorvastatin]     Muscles aches    Patient Measurements: Height: 5\' 7"  (170.2 cm) Weight: 145 lb (65.8 kg) IBW/kg (Calculated) : 66.1 Heparin Dosing Weight: 66kg  Vital Signs: Temp: 98.2 F (36.8 C) (03/04 1455) Temp Source: Oral (03/04 1455) BP: 161/82 (03/04 1800) Pulse Rate: 55 (03/04 1800)  Labs: Recent Labs    05/03/19 1504  HGB 14.1  HCT 43.5  PLT 233  CREATININE 0.79  TROPONINIHS 2    Estimated Creatinine Clearance: 72 mL/min (by C-G formula based on SCr of 0.79 mg/dL).   Medical History: Past Medical History:  Diagnosis Date  . Agent orange exposure   . Arthritis   . Coronary artery disease    BMS to mid LAD 2001, DES to proximal LAD 2017  . Enlarged prostate    XRT 2016  . Headache    Sinus headaches   . History of depression   . Hyperlipidemia   . Hypothyroidism   . Prostate cancer ([place]) 07/2014   hormonal therapy, external beam radiation therapy  . PTSD (post-traumatic stress disorder)     Medications:  (Not in a hospital admission)  Scheduled:  . sodium chloride flush  3 mL Intravenous Once    Assessment: Pt with a hx of CAD who presented with new onset CP. Heparin has been ordered for anticoagulation. CBC within normal limit. He is not on AC outpt.   Goal of Therapy:  Heparin level 0.3-0.7 units/ml Monitor platelets by anticoagulation protocol: Yes   Plan:  Heparin bolus 4000 units x1 Heparin infusion 1000 units/hr F/u with 6 hr HL Daily HL and CBC  Onnie Boer, PharmD, BCIDP, AAHIVP, CPP Infectious Disease Pharmacist 05/03/2019 6:29 PM

## 2019-05-03 NOTE — ED Triage Notes (Signed)
Patient complains of chest pain onset 3 days ago. 1 Nitro taken at 1300 w/o relief. Patient had cardio cath with 3 stents in 2014-2015. Pain radiates to the left arm at times. Denies N/V and diaphoresis.

## 2019-05-03 NOTE — ED Notes (Signed)
Obtained receiving nurse phone # via Butch Penny, on [address] at [facility] to call [phone] for report- no answer

## 2019-05-03 NOTE — H&P (Signed)
Cardiology Admission History and Physical:   Patient ID: Keith Olson. MRN: [id number]; DOB: 1940-10-11   Admission date: 05/03/2019  Primary Care Provider: Dettinger, Keith Kaufmann, MD Primary Cardiologist: Keith Lesches, MD  Primary Electrophysiologist:  None   Chief Complaint:  Chest pain/unstable angina  Patient Profile:   Keith Olson. is a 79 y.o. male with past medical history of CAD (s/p BMS to LAD 2001, DES to prox LAD 2017, patent stents by cath 08/2016, DES to prox/mid LAD 08/2017), hyperlpidemia, hypothyroidism and agent orange exposure who presented to [facility] on 05/03/2019 with chest pain concerning for unstable angina.  History of Present Illness:   Keith. Keith Olson has a past medical history as above.  He has CAD status post multiple stents with last cardiac cath in 08/2017 that showed patent proximal LAD stents with mild to moderate restenosis.  He also had severe stenosis of the mid LAD beyond the old stents that was treated with PTCA/DES x1.  He also had otherwise moderate nonobstructive RCA disease that was managed medically.  Crestor was increased to 40 mg daily based on an LDL of 142.  The patient is noted to have an allergy to atorvastatin.  Continued on aspirin and Plavix.  Not on beta-blocker due to baseline bradycardia in the 50s.  He was last seen in the office by Dr. Domenic Olson in 08/2017 for hospital follow-up.  Keith Olson called Dr. Myles Olson office today with complaints of chest pain/dizziness/shortness of breath for the last 2 days.  He reportedly had taken nitroglycerin that gave him a headache but no relief of his chest pain.  The patient was advised to go to the ED at [facility].    In the ED he reported constant left-sided chest pressure that briefly radiated down the left arm.  His chest pain was reported as persistent for 3 days and was not worse with activity, position or deep breathing.  He was given aspirin 162 mg.  High-sensitivity troponins were 2 on  both checks.  EKG was nonischemic.  Chest x-ray without acute findings. On admission, he endorses improvement of his pain on heparin and nitro drips. He notes ongoing 3/10 chest discomfort with minor shortness of breath. He denies any lightheadedness, dizziness, nausea, palpitations, orthopnea, PND or diaphoresis.  Heart Pathway Score:     Past Medical History:  Diagnosis Date  . Agent orange exposure   . Arthritis   . Coronary artery disease    BMS to mid LAD 2001, DES to proximal LAD 2017  . Enlarged prostate    XRT 2016  . Headache    Sinus headaches   . History of depression   . Hyperlipidemia   . Hypothyroidism   . Prostate cancer ([place]) 07/2014   hormonal therapy, external beam radiation therapy  . PTSD (post-traumatic stress disorder)     Past Surgical History:  Procedure Laterality Date  . CARDIAC CATHETERIZATION N/A 12/12/2015   Procedure: Left Heart Cath and Coronary Angiography;  Surgeon: Peter M Martinique, MD;  Location: [place] [facility];  Service: Cardiovascular;  Laterality: N/A;  . CARDIAC CATHETERIZATION N/A 12/12/2015   Procedure: Coronary Stent Intervention;  Surgeon: Peter M Martinique, MD;  Location: [place] CV [facility];  Service: Cardiovascular;  Laterality: N/A;  . CORONARY STENT INTERVENTION N/A 09/06/2017   Procedure: CORONARY STENT INTERVENTION;  Surgeon: Burnell Blanks, MD;  Location: [place] CV LAB;  Service: Cardiovascular;  Laterality: N/A;  . HERNIA REPAIR Right   . LEFT HEART  CATH AND CORONARY ANGIOGRAPHY N/A 09/02/2016   Procedure: Left Heart Cath and Coronary Angiography;  Surgeon: Leonie Man, MD;  Location: [place] CV LAB;  Service: Cardiovascular;  Laterality: N/A;  . LEFT HEART CATH AND CORONARY ANGIOGRAPHY N/A 09/06/2017   Procedure: LEFT HEART CATH AND CORONARY ANGIOGRAPHY;  Surgeon: Burnell Blanks, MD;  Location: [place] CV [facility];  Service: Cardiovascular;  Laterality: N/A;  . PROSTATE BIOPSY N/A 08/28/2014   Procedure:  BIOPSY TRANSRECTAL ULTRASONIC PROSTATE (TUBP);  Surgeon: Rana Snare, MD;  Location: [facility];  Service: Urology;  Laterality: N/A;  . TRANSURETHRAL RESECTION OF PROSTATE N/A 08/28/2014   Procedure: TRANSURETHRAL RESECTION OF THE PROSTATE WITH GYRUS INSTRUMENTS;  Surgeon: Rana Snare, MD;  Location: [facility];  Service: Urology;  Laterality: N/A;     Medications Prior to Admission: Prior to Admission medications   Medication Sig Start Date End Date Taking? Authorizing Provider  acetaminophen-codeine (TYLENOL #3) 300-30 MG tablet Take 1 tablet by mouth every 6 (six) hours as needed for moderate pain. 05/22/18   Dettinger, Keith Kaufmann, MD  aspirin EC 81 MG tablet Take 1 tablet (81 mg total) by mouth daily. 12/09/15   Satira Sark, MD  clopidogrel (PLAVIX) 75 MG tablet Take 1 tablet (75 mg total) by mouth daily. 09/07/17   Bhagat, Crista Luria, PA  diclofenac sodium (VOLTAREN) 1 % GEL APPLY 2 GRAMS TOPICALLY 4 TIMES DAILY 12/21/18   Dettinger, Keith Kaufmann, MD  DULoxetine (CYMBALTA) 30 MG capsule Take 1 capsule (30 mg total) by mouth daily. For one week then two daily. Take with a full stomach at suppertime 11/13/18   Claretta Fraise, MD  levothyroxine (SYNTHROID, LEVOTHROID) 75 MCG tablet Take 1 tablet (75 mcg total) by mouth daily before breakfast. 09/16/17   Dettinger, Keith Kaufmann, MD  nitroGLYCERIN (NITROSTAT) 0.4 MG SL tablet Place 1 tablet (0.4 mg total) under the tongue every 5 (five) minutes as needed for chest pain. X 3 doses 09/07/17   Bhagat, Bhavinkumar, PA  rosuvastatin (CRESTOR) 40 MG tablet Take 1 tablet (40 mg total) by mouth daily. 09/07/17   Bhagat, Bhavinkumar, PA  SUMAtriptan (IMITREX) 25 MG tablet TAKE 1 TABLET BY MOUTH EVERY 2 HOURS AS NEEDED FOR MIGRAINE. MAY REPEAT IN 2 HOURS IF HEADACHE PERSISTS OR RECURS 12/08/18   Dettinger, Keith Kaufmann, MD  tamsulosin (FLOMAX) 0.4 MG CAPS capsule Take 0.4 mg by mouth at bedtime. 05/23/18   [provider]  topiramate (TOPAMAX) 25 MG tablet Take 1  tablet (25 mg total) by mouth 2 (two) times daily. 01/18/18   Dettinger, Keith Kaufmann, MD     Allergies:    Allergies  Allergen Reactions  . Lipitor [Atorvastatin]     Muscles aches    Social History:   Social History   Socioeconomic History  . Marital status: Married    Spouse name: Not on file  . Number of children: Not on file  . Years of education: Not on file  . Highest education level: Not on file  Occupational History  . Occupation: retired  Tobacco Use  . Smoking status: Never Smoker  . Smokeless tobacco: Former Systems developer    Types: Chew  Substance and Sexual Activity  . Alcohol use: No  . Drug use: No  . Sexual activity: Yes  Other Topics Concern  . Not on file  Social History Narrative   Married   5 children   Social Determinants of Health   Financial Resource Strain:   . Difficulty of Paying  Living Expenses: Not on file  Food Insecurity:   . Worried About Charity fundraiser in the Last Year: Not on file  . Ran Out of Food in the Last Year: Not on file  Transportation Needs:   . Lack of Transportation (Medical): Not on file  . Lack of Transportation (Non-Medical): Not on file  Physical Activity:   . Days of Exercise per Week: Not on file  . Minutes of Exercise per Session: Not on file  Stress:   . Feeling of Stress : Not on file  Social Connections:   . Frequency of Communication with Friends and Family: Not on file  . Frequency of Social Gatherings with Friends and Family: Not on file  . Attends Religious Services: Not on file  . Active Member of Clubs or Organizations: Not on file  . Attends Archivist Meetings: Not on file  . Marital Status: Not on file  Intimate Partner Violence:   . Fear of Current or Ex-Partner: Not on file  . Emotionally Abused: Not on file  . Physically Abused: Not on file  . Sexually Abused: Not on file    Family History:   The patient's family history includes Arthritis in his mother; Heart attack in his father.     ROS:  Please see the history of present illness.  All other ROS reviewed and negative.     Physical Exam/Data:   Vitals:   05/03/19 2030 05/03/19 2100 05/03/19 2115 05/03/19 2220  BP: 125/69 119/65 95/82 111/70  Pulse: (!) 51 (!) 49 (!) 57   Resp: 13 15 18 18   Temp:      TempSrc:      SpO2: 96% 100% 100% 100%  Weight:    67.8 kg  Height:    5\' 7"  (1.702 m)   No intake or output data in the 24 hours ending 05/03/19 2250 Last 3 Weights 05/03/2019 05/03/2019 11/13/2018  Weight (lbs) 149 lb 8 oz 145 lb 152 lb 3.2 oz  Weight (kg) 67.813 kg 65.772 kg 69.037 kg     Body mass index is 23.42 kg/m.  General:  Well nourished, well developed, in no acute distress HEENT: normal Lymph: no adenopathy Neck: no JVD Endocrine:  No thryomegaly Cardiac:  normal S1, S2; RRR; no murmur  Lungs:  clear to auscultation bilaterally, no wheezing, rhonchi or rales  Abd: soft, nontender, no hepatomegaly  Ext: no edema Musculoskeletal:  No deformities, BUE and BLE strength normal and equal Skin: warm and dry  Neuro:  CNs 2-12 intact, no focal abnormalities noted Psych:  Normal affect    EKG:  The ECG that was done was personally reviewed and demonstrates Normal sinus rhythm, 60 bpm, LAD, mild T wave inversions inferiorly   Relevant CV Studies:  CORONARY STENT INTERVENTION7/9/19  LEFT HEART CATH AND CORONARY ANGIOGRAPHY  Conclusion     Prox RCA lesion is 40% stenosed.  Dist RCA lesion is 40% stenosed.  Mid RCA lesion is 30% stenosed.  RPDA lesion is 50% stenosed.  Prox Cx lesion is 20% stenosed.  Ost LAD to Prox LAD lesion is 40% stenosed.  Prox LAD to Mid LAD lesion is 95% stenosed.  A drug-eluting stent was successfully placed using a STENT SYNERGY DES [id number].  Post intervention, there is a 0% residual stenosis.  Post intervention, there is a 0% residual stenosis.  The left ventricular systolic function is normal.  LV end diastolic pressure is normal.  The left  ventricular ejection fraction is  55-65% by visual estimate.  There is no mitral valve regurgitation.  1. Patent proximal LAD stents with mild to moderate restenosis. Severe stenosis mid LAD beyond the old stents.  2. Successful PTCA/DES x 1 proximal and mid LAD 3. Moderate non-obstructive disease in the RCA 4. Normal LV systolic function  Recommendations: Will continue statin. No beta blocker with bradycardia. Aggrastat drip for 6 hours given thrombus in vessel during PCI.  Recommend uninterrupted dual antiplatelet therapy with Aspirin 81mg  daily and Clopidogrel 75mg  daily for a minimum of 12 months (ACS - Class I recommendation).   Echo 09/06/17 Study Conclusions  - Left ventricle: The cavity size was normal. Wall thickness was increased increased in a pattern of mild to moderate LVH. Systolic function was normal. The estimated ejection fraction was in the range of 60% to 65%. Wall motion was normal; there were no regional wall motion abnormalities. Doppler parameters are consistent with abnormal left ventricular relaxation (grade 1 diastolic dysfunction). - Aortic valve: There was mild regurgitation. Valve area (VTI): 3.91 cm^2. Valve area (Vmax): 3.45 cm^2. Valve area (Vmean): 3.68 cm^2. - Technically adequate study.    Laboratory Data:  High Sensitivity Troponin:   Recent Labs  Lab 05/03/19 1504 05/03/19 1716  TROPONINIHS 2 2      Chemistry Recent Labs  Lab 05/03/19 1504  NA 137  K 3.7  CL 105  CO2 24  GLUCOSE 106*  BUN 12  CREATININE 0.79  CALCIUM 9.1  GFRNONAA >60  GFRAA >60  ANIONGAP 8    No results for input(s): PROT, ALBUMIN, AST, ALT, ALKPHOS, BILITOT in the last 168 hours. Hematology Recent Labs  Lab 05/03/19 1504  WBC 8.5  RBC 4.57  HGB 14.1  HCT 43.5  MCV 95.2  MCH 30.9  MCHC 32.4  RDW 13.0  PLT 233   BNPNo results for input(s): BNP, PROBNP in the last 168 hours.  DDimer No results for input(s): DDIMER in the last  168 hours.   Radiology/Studies:  DG Chest 2 View  Result Date: 05/03/2019 CLINICAL DATA:  Chest pain EXAM: CHEST - 2 VIEW COMPARISON:  Radiograph 09/24/2013 FINDINGS: No consolidation, features of edema, pneumothorax, or effusion. Pulmonary vascularity is normally distributed. The aorta is calcified. Coronary stent projects over the heart on lateral film. The remaining cardiomediastinal contours are unremarkable. No acute osseous or soft tissue abnormality. Degenerative changes are present in the imaged spine and shoulders. IMPRESSION: No acute cardiopulmonary disease. Electronically Signed   By: Lovena Le M.D.   On: 05/03/2019 15:44    TIMI Risk Score for Unstable Angina or Non-ST Elevation MI:   The patient's TIMI risk score is 5, which indicates a 26% risk of all cause mortality, new or recurrent myocardial infarction or need for urgent revascularization in the next 14 days.   Assessment and Plan:    CAD with unstable angina- Has a history of multiple stents and presents with ongoing chest pain with negative troponins consistent with unstable angina. He continues on medical therapy with aspirin 81 mg, Plavix 75 mg and high intensity statin.  No beta-blocker due to history of baseline bradycardia. -Patient has not followed up since 08/2017 after his last stent. - NPO at midnight for LHC in AM - Check lipid panel, HgA1c and TSH  Hyperlipidemia- Reported allergy to atorvastatin. He does not think he was taking rosuvastatin at home. - Repeat lipid panel - Restart rosuvastatin 40mg    Elevated blood pressure -Patient has no history of hypertension.  Blood pressure was  elevated 140s-160s/70s-80s in the ED at [place] to monitor and treat as needed.   Severity of Illness: The appropriate patient status for this patient is INPATIENT. Inpatient status is judged to be reasonable and necessary in order to provide the required intensity of service to ensure the patient's  safety. The patient's presenting symptoms, physical exam findings, and initial radiographic and laboratory data in the context of their chronic comorbidities is felt to place them at high risk for further clinical deterioration. Furthermore, it is not anticipated that the patient will be medically stable for discharge from the hospital within 2 midnights of admission. The following factors support the patient status of inpatient.   " The patient's presenting symptoms include chest pain. " The worrisome physical exam findings include chest pain. " The chronic co-morbidities include CAD   * I certify that at the point of admission it is my clinical judgment that the patient will require inpatient hospital care spanning beyond 2 midnights from the point of admission due to high intensity of service, high risk for further deterioration and high frequency of surveillance required.*    For questions or updates, please contact [place] Please consult [email or web address] for contact info under        Signed, Princella Pellegrini, MD  05/03/2019 10:50 PM

## 2019-05-03 NOTE — Telephone Encounter (Signed)
Pt c/o of Chest Pain: 1. Are you having CP right now? YES      (SCALE OF 1-10 )   PAIN IS 8 2. Are you experiencing any other symptoms (ex. SOB, nausea, vomiting, sweating)?  NO 3. How long have you been experiencing CP?  2-3 DAYS  4. Is your CP continuous or coming and going? CONTINUOUS  5. Have you taken Nitroglycerin? YES took 2 hours ago. Still having pain.

## 2019-05-03 NOTE — Telephone Encounter (Signed)
Pt c/o chest pain/dizziness/SOB for the last 2 days - taken NTG a few hours ago which gave him a headache with no relief in chest pain - pt agreeable for ED evaluation and will have son drive him to [facility] ED

## 2019-05-03 NOTE — ED Notes (Addendum)
Security has brought back pocket knife. Have given to patient to take to [facility]

## 2019-05-03 NOTE — ED Provider Notes (Signed)
[facility] EMERGENCY DEPARTMENT Provider Note   CSN: [id number] Arrival date & time: 05/03/19  1431     History Chief Complaint  Patient presents with  . Chest Pain    Keith Olson. is a 79 y.o. male w/ hx of LAD stent 2001, DES to proximal LAD 2017, HLD, presenting to the ED with chest pain.  Patient reports he had gradual onset of chest pain approximately 3 days ago while sitting on the couch.  He was not engaging in physical activity at the time.  He describes a constant pressure sensation in the left side of his chest.  Briefly it radiated down his left arm and he felt like his left arm was "going to explode".  However that left arm symptom has resolved.  His chest pain has remained persistent for 3 days.  It is not worsened by activity or position.  It is not worsened by deep inspiration.  He is to get a similar to the chest pain he had a few years ago when he had his most recent stent put in his chest.  He did take 1 sublingual nitroglycerin tablet did not get any relief of his chest pain.  He says his tablets are 79 years old.  He denies hx of smoking, HTN.  Reports very significant family hx of MI with most male members having MI, including an uncle at age 21.  No known hx of aneurysm  He reports coughing for several days.  No fevers, chills, nausea, vomiting, congestion.  No hemoptysis or asymmetric LE edema. Patient denies personal or family history of DVT or PE. No recent hormone use (including OCP); travel for >6 hours; prolonged immobilization for greater than 3 days; surgeries or trauma in the last 4 weeks; or malignancy with treatment within 6 months.   HPI     Past Medical History:  Diagnosis Date  . Agent orange exposure   . Arthritis   . Coronary artery disease    BMS to mid LAD 2001, DES to proximal LAD 2017  . Enlarged prostate    XRT 2016  . Headache    Sinus headaches   . History of depression   . Hyperlipidemia   . Hypothyroidism   . Prostate  cancer ([place]) 07/2014   hormonal therapy, external beam radiation therapy  . PTSD (post-traumatic stress disorder)     Patient Active Problem List   Diagnosis Date Noted  . Unstable angina ([place])   . History of depression 09/05/2017  . Hypothyroidism 09/05/2017  . Depression, major, single episode, moderate ([place]) 11/26/2016  . Prostatitis 11/26/2016  . Rectal bleeding 02/25/2016  . Pain management contract signed 01/27/2016  . Constipation 04/22/2015  . Lower abdominal pain 04/22/2015  . Anxiety state 12/16/2014  . Malignant neoplasm of prostate ([place]) 09/19/2014  . 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 Hyperlipidemia 05/22/2012  . BPH (benign prostatic hyperplasia) 05/22/2012  . Chest pain 02/24/2012  . CAD (coronary artery disease) 02/24/2012    Past Surgical History:  Procedure Laterality Date  . CARDIAC CATHETERIZATION N/A 12/12/2015   Procedure: Left Heart Cath and Coronary Angiography;  Surgeon: Peter M Martinique, MD;  Location: [place] [facility];  Service: Cardiovascular;  Laterality: N/A;  . CARDIAC CATHETERIZATION N/A 12/12/2015   Procedure: Coronary Stent Intervention;  Surgeon: Peter M Martinique, MD;  Location: [place] [facility];  Service: Cardiovascular;  Laterality: N/A;  . CORONARY STENT INTERVENTION N/A 09/06/2017   Procedure: CORONARY STENT INTERVENTION;  Surgeon: Angelena Form,  Annita Brod, MD;  Location: [place] [facility];  Service: Cardiovascular;  Laterality: N/A;  . HERNIA REPAIR Right   . LEFT HEART CATH AND CORONARY ANGIOGRAPHY N/A 09/02/2016   Procedure: Left Heart Cath and Coronary Angiography;  Surgeon: Leonie Man, MD;  Location: [place] [facility];  Service: Cardiovascular;  Laterality: N/A;  . LEFT HEART CATH AND CORONARY ANGIOGRAPHY N/A 09/06/2017   Procedure: LEFT HEART CATH AND CORONARY ANGIOGRAPHY;  Surgeon: Burnell Blanks, MD;  Location: [place] [facility];  Service: Cardiovascular;  Laterality: N/A;  . PROSTATE BIOPSY N/A 08/28/2014    Procedure: BIOPSY TRANSRECTAL ULTRASONIC PROSTATE (TUBP);  Surgeon: Rana Snare, MD;  Location: [facility];  Service: Urology;  Laterality: N/A;  . TRANSURETHRAL RESECTION OF PROSTATE N/A 08/28/2014   Procedure: TRANSURETHRAL RESECTION OF THE PROSTATE WITH GYRUS INSTRUMENTS;  Surgeon: Rana Snare, MD;  Location: [facility];  Service: Urology;  Laterality: N/A;       Family History  Problem Relation Age of Onset  . Arthritis Mother   . Heart attack Father     Social History   Tobacco Use  . Smoking status: Never Smoker  . Smokeless tobacco: Former Systems developer    Types: Chew  Substance Use Topics  . Alcohol use: No  . Drug use: No    Home Medications Prior to Admission medications   Medication Sig Start Date End Date Taking? Authorizing Provider  ALPRAZolam Duanne Moron) 0.5 MG tablet Take 0.5 mg by mouth 2 (two) times daily as needed. 04/05/19  Yes [provider]  aspirin EC 81 MG tablet Take 1 tablet (81 mg total) by mouth daily. 12/09/15  Yes Satira Sark, MD  celecoxib (CELEBREX) 100 MG capsule Take 1 capsule by mouth 2 (two) times daily as needed. 01/24/19  Yes [provider]  diclofenac sodium (VOLTAREN) 1 % GEL APPLY 2 GRAMS TOPICALLY 4 TIMES DAILY Patient taking differently: Apply 2 g topically 4 (four) times daily as needed.  12/21/18  Yes Dettinger, Fransisca Kaufmann, MD  levothyroxine (SYNTHROID, LEVOTHROID) 75 MCG tablet Take 1 tablet (75 mcg total) by mouth daily before breakfast. 09/16/17  Yes Dettinger, Fransisca Kaufmann, MD  nitroGLYCERIN (NITROSTAT) 0.4 MG SL tablet Place 1 tablet (0.4 mg total) under the tongue every 5 (five) minutes as needed for chest pain. X 3 doses Patient taking differently: Place 0.4 mg under the tongue every 5 (five) minutes as needed for chest pain.  09/07/17  Yes Bhagat, Bhavinkumar, PA  rosuvastatin (CRESTOR) 40 MG tablet Take 1 tablet (40 mg total) by mouth daily. 09/07/17  Yes Bhagat, Bhavinkumar, PA  SUMAtriptan (IMITREX) 25 MG tablet TAKE 1 TABLET BY  MOUTH EVERY 2 HOURS AS NEEDED FOR MIGRAINE. MAY REPEAT IN 2 HOURS IF HEADACHE PERSISTS OR RECURS Patient taking differently: Take 25 mg by mouth every 2 (two) hours as needed for migraine or headache. TAKE 1 TABLET BY MOUTH EVERY 2 HOURS AS NEEDED FOR MIGRAINE. MAY REPEAT IN 2 HOURS IF HEADACHE PERSISTS OR RECURS 12/08/18  Yes Dettinger, Fransisca Kaufmann, MD  tamsulosin (FLOMAX) 0.4 MG CAPS capsule Take 0.4 mg by mouth at bedtime. 05/23/18  Yes [provider]    Allergies    Lipitor [atorvastatin]  Review of Systems   Review of Systems  Constitutional: Negative for chills and fever.  Respiratory: Positive for cough and shortness of breath.   Cardiovascular: Positive for chest pain. Negative for palpitations.  Gastrointestinal: Negative for abdominal pain, nausea and vomiting.  Skin: Negative for pallor  and rash.  Neurological: Negative for syncope and headaches.  All other systems reviewed and are negative.   Physical Exam Updated Vital Signs BP (!) 104/59 (BP Location: Left Arm)   Pulse (!) 52   Temp (!) 97.5 F (36.4 C) (Oral)   Resp 17   Ht 5\' 7"  (1.702 m)   Wt 67.4 kg   SpO2 100%   BMI 23.26 kg/m   Physical Exam Vitals and nursing note reviewed.  Constitutional:      Appearance: He is well-developed.  HENT:     Head: Normocephalic and atraumatic.  Eyes:     Conjunctiva/sclera: Conjunctivae normal.  Cardiovascular:     Rate and Rhythm: Normal rate and regular rhythm.     Heart sounds: No murmur.  Pulmonary:     Effort: Pulmonary effort is normal. No respiratory distress.     Breath sounds: Normal breath sounds.  Abdominal:     Palpations: Abdomen is soft.     Tenderness: There is no abdominal tenderness.  Musculoskeletal:     Cervical back: Neck supple.     Right lower leg: No edema.     Left lower leg: No tenderness. No edema.  Skin:    General: Skin is warm and dry.  Neurological:     General: No focal deficit present.     Mental Status: He is alert and  oriented to person, place, and time.  Psychiatric:        Mood and Affect: Mood normal.        Behavior: Behavior normal.     ED Results / Procedures / Treatments   Labs (all labs ordered are listed, but only abnormal results are displayed) Labs Reviewed  BASIC METABOLIC PANEL - Abnormal; Notable for the following components:      Result Value   Glucose, Bld 106 (*)    All other components within normal limits  HEMOGLOBIN A1C - Abnormal; Notable for the following components:   Hgb A1c MFr Bld 5.7 (*)    All other components within normal limits  LIPID PANEL - Abnormal; Notable for the following components:   Cholesterol 214 (*)    HDL 34 (*)    LDL Cholesterol 154 (*)    All other components within normal limits  SARS CORONAVIRUS 2 BY RT PCR (DIASORIN)  CBC  APTT  PROTIME-INR  TSH  T4, FREE  HEPARIN LEVEL (UNFRACTIONATED)  HEPARIN LEVEL (UNFRACTIONATED)  TROPONIN I (HIGH SENSITIVITY)  TROPONIN I (HIGH SENSITIVITY)    EKG EKG Interpretation  Date/Time:  Thursday May 03 2019 14:49:27 EST Ventricular Rate:  60 PR Interval:  170 QRS Duration: 88 QT Interval:  410 QTC Calculation: 410 R Axis:   -39 Text Interpretation: Normal sinus rhythm Left axis deviation Abnormal ECG No STEMI Confirmed by Octaviano Glow [phone]) on 05/03/2019 3:08:17 PM   Radiology DG Chest 2 View  Result Date: 05/03/2019 CLINICAL DATA:  Chest pain EXAM: CHEST - 2 VIEW COMPARISON:  Radiograph 09/24/2013 FINDINGS: No consolidation, features of edema, pneumothorax, or effusion. Pulmonary vascularity is normally distributed. The aorta is calcified. Coronary stent projects over the heart on lateral film. The remaining cardiomediastinal contours are unremarkable. No acute osseous or soft tissue abnormality. Degenerative changes are present in the imaged spine and shoulders. IMPRESSION: No acute cardiopulmonary disease. Electronically Signed   By: Lovena Le M.D.   On: 05/03/2019 15:44     Procedures .Critical Care Performed by: Wyvonnia Dusky, MD Authorized by: Wyvonnia Dusky, MD  Critical care provider statement:    Critical care time (minutes):  42   Critical care was necessary to treat or prevent imminent or life-threatening deterioration of the following conditions:  Cardiac failure   Critical care was time spent personally by me on the following activities:  Discussions with consultants, evaluation of patient's response to treatment, examination of patient, ordering and performing treatments and interventions, ordering and review of laboratory studies, ordering and review of radiographic studies, pulse oximetry, re-evaluation of patient's condition, obtaining history from patient or surrogate and review of old charts Comments:     Unstable angina requiring IV heparin and IV nitro infusion for pain control   (including critical care time)  Medications Ordered in ED Medications  sodium chloride flush (NS) 0.9 % injection 3 mL (3 mLs Intravenous Not Given 05/03/19 1520)  nitroGLYCERIN 50 mg in dextrose 5 % 250 mL (0.2 mg/mL) infusion (10 mcg/min Intravenous Rate/Dose Verify 05/04/19 0600)  heparin bolus via infusion 4,000 Units (4,000 Units Intravenous Bolus from Bag 05/03/19 1841)    Followed by  heparin ADULT infusion 100 units/mL (25000 units/263mL sodium chloride 0.45%) (1,000 Units/hr Intravenous Rate/Dose Verify 05/04/19 0600)  aspirin EC tablet 81 mg (81 mg Oral Given 05/04/19 0939)  SUMAtriptan (IMITREX) tablet 25 mg (25 mg Oral Given 05/03/19 2333)  rosuvastatin (CRESTOR) tablet 40 mg (40 mg Oral Given 05/04/19 0938)  ALPRAZolam (XANAX) tablet 0.5 mg (has no administration in time range)  levothyroxine (SYNTHROID) tablet 75 mcg (75 mcg Oral Given 05/04/19 0459)  tamsulosin (FLOMAX) capsule 0.4 mg (0.4 mg Oral Given 05/03/19 2333)  diclofenac sodium (VOLTAREN) 1 % transdermal gel 2 g (has no administration in time range)  acetaminophen (TYLENOL) tablet 650 mg (650 mg  Oral Given 05/04/19 1116)  ondansetron (ZOFRAN) injection 4 mg (4 mg Intravenous Given 05/04/19 1036)  clopidogrel (PLAVIX) tablet 75 mg (75 mg Oral Given 05/04/19 0939)  pneumococcal 23 valent vaccine (PNEUMOVAX-23) injection 0.5 mL (has no administration in time range)  influenza vaccine adjuvanted (FLUAD) injection 0.5 mL (has no administration in time range)  aspirin chewable tablet 162 mg (162 mg Oral Given 05/03/19 1559)  nitroGLYCERIN (NITROSTAT) SL tablet 0.4 mg (0.4 mg Sublingual Given 05/03/19 1600)    ED Course  I have reviewed the triage vital signs and the nursing notes.  Pertinent labs & imaging results that were available during my care of the patient were reviewed by me and considered in my medical decision making (see chart for details).   79 yo male presenting to ED with chest pain onset 3 days ago. Has sig family hx of CAD, and he himself has had 3 stents in the past  ECG on arrival is non-ischemia.  We'll check troponin  DG chest without evidence of PTX or PNA.  No fever either.  Doubt COVID-19.  Aortic dissection is lower on my differential as he is only mildly HTN here.  He has equal pulses and BP in both arms.   (163/78 right and 163/83 left).  There is no hx of aneurysm.   He looks comfortable now, not diaphrotic or in significant distress.  We'll give 162 mg aspirin (he took 1-2 baby this morning) and 1 SL nitro to see if he gets any relief.  No hx of reflux or gerd.   I'll discuss with cardiology as he is a higher risk candidate for ACS.  Final Clinical Impression(s) / ED Diagnoses Final diagnoses:  Unstable angina ([place])    Rx / DC Orders ED  Discharge Orders    None       Wyvonnia Dusky, MD 05/04/19 1204

## 2019-05-04 ENCOUNTER — Other Ambulatory Visit: Payer: Self-pay

## 2019-05-04 ENCOUNTER — Encounter (HOSPITAL_COMMUNITY)
Admission: EM | Disposition: A | Discharge status: Home / Self Care | Payer: Self-pay | Source: Home / Self Care | Attending: Internal Medicine

## 2019-05-04 DIAGNOSIS — E785 Hyperlipidemia, unspecified: Secondary | ICD-10-CM

## 2019-05-04 DIAGNOSIS — I200 Unstable angina: Secondary | ICD-10-CM

## 2019-05-04 DIAGNOSIS — I2510 Atherosclerotic heart disease of native coronary artery without angina pectoris: Secondary | ICD-10-CM

## 2019-05-04 HISTORY — PX: LEFT HEART CATH AND CORONARY ANGIOGRAPHY: CATH118249

## 2019-05-04 LAB — LIPID PANEL
Cholesterol: 214 mg/dL — ABNORMAL HIGH (ref 0–200)
HDL: 34 mg/dL — ABNORMAL LOW (ref >40)
LDL Cholesterol: 154 mg/dL — ABNORMAL HIGH (ref 0–99)
Total CHOL/HDL Ratio: 6.3 RATIO
Triglycerides: 129 mg/dL (ref <150)
VLDL: 26 mg/dL (ref 0–40)

## 2019-05-04 LAB — TSH: TSH: 3.193 u[IU]/mL (ref 0.350–4.500)

## 2019-05-04 LAB — HEPARIN LEVEL (UNFRACTIONATED)
Heparin Unfractionated: 0.56 IU/mL (ref 0.30–0.70)
Heparin Unfractionated: 0.62 IU/mL (ref 0.30–0.70)

## 2019-05-04 LAB — T4, FREE: Free T4: 0.71 ng/dL (ref 0.61–1.12)

## 2019-05-04 LAB — SARS CORONAVIRUS 2 BY RT PCR (DIASORIN): SARS Coronavirus 2: NEGATIVE

## 2019-05-04 SURGERY — LEFT HEART CATH AND CORONARY ANGIOGRAPHY
Anesthesia: LOCAL

## 2019-05-04 MED ORDER — SODIUM CHLORIDE 0.9 % WEIGHT BASED INFUSION
1.0000 mL/kg/h | INTRAVENOUS | Status: DC
  Administered 2019-05-04: 1 mL/kg/h via INTRAVENOUS

## 2019-05-04 MED ORDER — LIDOCAINE HCL (PF) 1 % IJ SOLN
INTRAMUSCULAR | Status: DC | PRN
  Administered 2019-05-04: 2 mL via INTRADERMAL

## 2019-05-04 MED ORDER — MIDAZOLAM HCL 2 MG/2ML IJ SOLN
INTRAMUSCULAR | Status: DC | PRN
  Administered 2019-05-04: 1 mg via INTRAVENOUS

## 2019-05-04 MED ORDER — FENTANYL CITRATE (PF) 100 MCG/2ML IJ SOLN
INTRAMUSCULAR | Status: DC | PRN
  Administered 2019-05-04: 25 ug via INTRAVENOUS

## 2019-05-04 MED ORDER — INFLUENZA VAC A&B SA ADJ QUAD 0.5 ML IM PRSY: 0.5000 mL | PREFILLED_SYRINGE | INTRAMUSCULAR | Status: DC

## 2019-05-04 MED ORDER — LIDOCAINE HCL (PF) 1 % IJ SOLN
INTRAMUSCULAR | Status: AC
  Filled 2019-05-04: qty 30

## 2019-05-04 MED ORDER — SODIUM CHLORIDE 0.9 % WEIGHT BASED INFUSION
3.0000 mL/kg/h | INTRAVENOUS | Status: DC
  Administered 2019-05-04: 3 mL/kg/h via INTRAVENOUS

## 2019-05-04 MED ORDER — SODIUM CHLORIDE 0.9% FLUSH: 3.0000 mL | Freq: Two times a day (BID) | INTRAVENOUS | Status: DC

## 2019-05-04 MED ORDER — HEPARIN (PORCINE) IN NACL 1000-0.9 UT/500ML-% IV SOLN
INTRAVENOUS | Status: DC | PRN
  Administered 2019-05-04 (×2): 500 mL

## 2019-05-04 MED ORDER — VERAPAMIL HCL 2.5 MG/ML IV SOLN
INTRAVENOUS | Status: DC | PRN
  Administered 2019-05-04: 10 mL via INTRA_ARTERIAL

## 2019-05-04 MED ORDER — SODIUM CHLORIDE 0.9 % WEIGHT BASED INFUSION: 1.0000 mL/kg/h | INTRAVENOUS | Status: AC

## 2019-05-04 MED ORDER — SODIUM CHLORIDE 0.9 % IV SOLN: 250.0000 mL | INTRAVENOUS | Status: DC | PRN

## 2019-05-04 MED ORDER — HEPARIN SODIUM (PORCINE) 1000 UNIT/ML IJ SOLN
INTRAMUSCULAR | Status: AC
  Filled 2019-05-04: qty 1

## 2019-05-04 MED ORDER — SODIUM CHLORIDE 0.9% FLUSH: 3.0000 mL | INTRAVENOUS | Status: DC | PRN

## 2019-05-04 MED ORDER — MIDAZOLAM HCL 2 MG/2ML IJ SOLN
INTRAMUSCULAR | Status: AC
  Filled 2019-05-04: qty 2

## 2019-05-04 MED ORDER — HEPARIN (PORCINE) IN NACL 1000-0.9 UT/500ML-% IV SOLN
INTRAVENOUS | Status: AC
  Filled 2019-05-04: qty 1000

## 2019-05-04 MED ORDER — PNEUMOCOCCAL VAC POLYVALENT 25 MCG/0.5ML IJ INJ: 0.5000 mL | INJECTION | INTRAMUSCULAR | Status: DC

## 2019-05-04 MED ORDER — FENTANYL CITRATE (PF) 100 MCG/2ML IJ SOLN
INTRAMUSCULAR | Status: AC
  Filled 2019-05-04: qty 2

## 2019-05-04 MED ORDER — HEPARIN SODIUM (PORCINE) 1000 UNIT/ML IJ SOLN
INTRAMUSCULAR | Status: DC | PRN
  Administered 2019-05-04: 3500 [IU] via INTRAVENOUS

## 2019-05-04 MED ORDER — ACETAMINOPHEN 325 MG PO TABS: 325.0000 mg | ORAL_TABLET | Freq: Once | ORAL | Status: DC

## 2019-05-04 MED ORDER — IOHEXOL 350 MG/ML SOLN
INTRAVENOUS | Status: DC | PRN
  Administered 2019-05-04: 65 mL via INTRA_ARTERIAL

## 2019-05-04 MED ORDER — VERAPAMIL HCL 2.5 MG/ML IV SOLN
INTRAVENOUS | Status: AC
  Filled 2019-05-04: qty 2

## 2019-05-04 SURGICAL SUPPLY — 10 items
CATH 5FR JL3.5 JR4 ANG PIG MP (CATHETERS) ×1 IMPLANT
CATH INFINITI 5FR AL1 (CATHETERS) ×1 IMPLANT
GLIDESHEATH SLEND SS 6F .021 (SHEATH) ×1 IMPLANT
GUIDEWIRE INQWIRE 1.5J.035X260 (WIRE) IMPLANT
INQWIRE 1.5J .035X260CM (WIRE) ×2
KIT HEART LEFT (KITS) ×2 IMPLANT
PACK CARDIAC CATHETERIZATION (CUSTOM PROCEDURE TRAY) ×2 IMPLANT
SYR MEDRAD MARK 7 150ML (SYRINGE) ×2 IMPLANT
TRANSDUCER W/STOPCOCK (MISCELLANEOUS) ×2 IMPLANT
TUBING CIL FLEX 10 FLL-RA (TUBING) ×2 IMPLANT

## 2019-05-04 NOTE — Progress Notes (Signed)
[place] for heparin Indication: chest pain/ACS  Allergies  Allergen Reactions  . Lipitor [Atorvastatin]     Muscles aches    Patient Measurements: Height: 5\' 7"  (170.2 cm) Weight: 148 lb 8 oz (67.4 kg) IBW/kg (Calculated) : 66.1 Heparin Dosing Weight: 66kg  Vital Signs: Temp: 97.5 F (36.4 C) (03/05 0444) Temp Source: Oral (03/05 0444) BP: 104/59 (03/05 0800) Pulse Rate: 52 (03/05 0416)  Labs: Recent Labs    05/03/19 1504 05/03/19 1716 05/04/19 0311 05/04/19 1039  HGB 14.1  --   --   --   HCT 43.5  --   --   --   PLT 233  --   --   --   APTT 29  --   --   --   LABPROT 14.4  --   --   --   INR 1.1  --   --   --   HEPARINUNFRC  --   --  0.56 0.62  CREATININE 0.79  --   --   --   TROPONINIHS 2 2  --   --     Estimated Creatinine Clearance: 72.3 mL/min (by C-G formula based on SCr of 0.79 mg/dL).    Assessment: Pt with a hx of CAD who presented with new onset CP. Heparin has been ordered for anticoagulation. CBC within normal limit. He is not on AC outpt.   Heparin level remains therapeutic at 0.62, on 1000 units/hr. No s/sx of bleeding or infusion issues.   Goal of Therapy:  Heparin level 0.3-0.7 units/ml Monitor platelets by anticoagulation protocol: Yes   Plan:  Continue heparin infusion 1000 units/hr F/u plan for cardiac cath today Monitor daily HL, CBC, and for s/sx of bleeding   Antonietta Jewel, PharmD, [place] Pharmacist  Phone: [phone]  Please check AMION for all [place] phone numbers After 10:00 PM, call [place] [phone] 05/04/2019 11:46 AM

## 2019-05-04 NOTE — Discharge Summary (Signed)
Discharge Summary    Patient ID: Keith Olson. MRN: [id number]; DOB: 1940/08/30  Admit date: 05/03/2019 Discharge date: 05/04/2019  Primary Care Provider: Dettinger, Fransisca Kaufmann, MD  Primary Cardiologist: Rozann Lesches, MD  Primary Electrophysiologist:  None   Discharge Diagnoses    Principal Problem:   Unstable angina [facility]) Active Problems:   Hyperlipidemia   Anxiety state   CAD (coronary artery disease)    Diagnostic Studies/Procedures    Left heart cath 05/04/19:  Previously placed Prox LAD to Mid LAD drug eluting stent is widely patent.  Balloon angioplasty was performed.  Previously placed Ost LAD to Prox LAD drug eluting stent is widely patent.  Balloon angioplasty was performed.  Prox Cx lesion is 20% stenosed.  Prox RCA lesion is 40% stenosed.  Dist RCA lesion is 40% stenosed.  Mid RCA lesion is 30% stenosed.  RPDA lesion is 50% stenosed.  The left ventricular systolic function is normal.  LV end diastolic pressure is normal.  The left ventricular ejection fraction is 55-65% by visual estimate.   1. Nonobstructive CAD. The stents in the LAD are widely patent. 2. Normal LV function 3. Normal LVEDP  Plan: continue medical management. May be a candidate for DC this PM. _____________   History of Present Illness     Keith Olson. is a 79 y.o. male with with past medical history of CAD (s/p BMS to LAD 2001, DES to prox LAD 2017, patent stents by cath 08/2016, DES to prox/mid LAD 08/2017), hyperlpidemia, hypothyroidism and agent orange exposure who presented to [facility] on 05/03/2019 with chest pain concerning for unstable angina.  [name] has a past medical history as above.  He has CAD status post multiple stents with last cardiac cath in 08/2017 that showed patent proximal LAD stents with mild to moderate restenosis.  He also had severe stenosis of the mid LAD beyond the old stents that was treated with PTCA/DES x1.  He also had otherwise  moderate nonobstructive RCA disease that was managed medically.  Crestor was increased to 40 mg daily based on an LDL of 142.  The patient is noted to have an allergy to atorvastatin.  Continued on aspirin and Plavix.  Not on beta-blocker due to baseline bradycardia in the 50s.  He was last seen in the office by Dr. Domenic Polite in 08/2017 for hospital follow-up.  [name] called Dr. Myles Gip office today with complaints of chest pain/dizziness/shortness of breath for the last 2 days.  He reportedly had taken nitroglycerin that gave him a headache but no relief of his chest pain.  The patient was advised to go to the ED at [facility]    In the ED he reported constant left-sided chest pressure that briefly radiated down the left arm.  His chest pain was reported as persistent for 3 days and was not worse with activity, position or deep breathing.  He was given aspirin 162 mg.  High-sensitivity troponins were 2 on both checks.  EKG was nonischemic.  Chest x-ray without acute findings. On admission, he reports improvement of his pain on heparin and nitro drips. He notes ongoing 3/10 chest discomfort with minor shortness of breath. He denies any lightheadedness, dizziness, nausea, palpitations, orthopnea, PND or diaphoresis.  Hospital Course     Consultants: none  Chest pain concerning for unstable angina CAD s/p LAD stent with mild to moderate restenosis followed by 95% stenosis treated with DES in 2019 Chest pain improved with nitro and heparin drips. EKG  with nonspecific T wave abnormality. Trops x 2 negative. Given his known disease, patient underwent repeat heart catheterization today. Angiography revealed patent LAD stent and nonobstructive disease in the RCA. He will continue ASA monotherapy. No beta blocker due to sinus bradycardia.    Hyperlipidemia with LDL goal < 70 05/04/2019: Cholesterol 214; HDL 34; LDL Cholesterol 154; Triglycerides 129; VLDL 26 Has been on crestor 40 mg and not at goal.  May benefit from referral to lipid clinic.    Pt seen and examined by Dr. Burt Knack and deemed stable for discharge once TR band is removed. Follow up has been arranged.   Did the patient have an acute coronary syndrome (MI, NSTEMI, STEMI, etc) this admission?:  No                               Did the patient have a percutaneous coronary intervention (stent / angioplasty)?:  No.   _____________  Discharge Vitals Blood pressure 139/72, pulse 64, temperature (!) 97.5 F (36.4 C), temperature source Oral, resp. rate 15, height 5\' 7"  (1.702 m), weight 67.4 kg, SpO2 98 %.  Filed Weights   05/03/19 1456 05/03/19 2220 05/04/19 0416  Weight: 65.8 kg 67.8 kg 67.4 kg    Labs & Radiologic Studies    CBC Recent Labs    05/03/19 1504  WBC 8.5  HGB 14.1  HCT 43.5  MCV 95.2  PLT 466   Basic Metabolic Panel Recent Labs    05/03/19 1504  NA 137  K 3.7  CL 105  CO2 24  GLUCOSE 106*  BUN 12  CREATININE 0.79  CALCIUM 9.1   Liver Function Tests No results for input(s): AST, ALT, ALKPHOS, BILITOT, PROT, ALBUMIN in the last 72 hours. No results for input(s): LIPASE, AMYLASE in the last 72 hours. High Sensitivity Troponin:   Recent Labs  Lab 05/03/19 1504 05/03/19 1716  TROPONINIHS 2 2    BNP Invalid input(s): POCBNP D-Dimer No results for input(s): DDIMER in the last 72 hours. Hemoglobin A1C Recent Labs    05/03/19 2259  HGBA1C 5.7*   Fasting Lipid Panel Recent Labs    05/04/19 0311  CHOL 214*  HDL 34*  LDLCALC 154*  TRIG 129  CHOLHDL 6.3   Thyroid Function Tests Recent Labs    05/03/19 2259  TSH 3.193   _____________  DG Chest 2 View  Result Date: 05/03/2019 CLINICAL DATA:  Chest pain EXAM: CHEST - 2 VIEW COMPARISON:  Radiograph 09/24/2013 FINDINGS: No consolidation, features of edema, pneumothorax, or effusion. Pulmonary vascularity is normally distributed. The aorta is calcified. Coronary stent projects over the heart on lateral film. The remaining  cardiomediastinal contours are unremarkable. No acute osseous or soft tissue abnormality. Degenerative changes are present in the imaged spine and shoulders. IMPRESSION: No acute cardiopulmonary disease. Electronically Signed   By: Lovena Le M.D.   On: 05/03/2019 15:44   CARDIAC CATHETERIZATION  Result Date: 05/04/2019  Previously placed Prox LAD to Mid LAD drug eluting stent is widely patent.  Balloon angioplasty was performed.  Previously placed Ost LAD to Prox LAD drug eluting stent is widely patent.  Balloon angioplasty was performed.  Prox Cx lesion is 20% stenosed.  Prox RCA lesion is 40% stenosed.  Dist RCA lesion is 40% stenosed.  Mid RCA lesion is 30% stenosed.  RPDA lesion is 50% stenosed.  The left ventricular systolic function is normal.  LV end diastolic pressure is normal.  The left ventricular ejection fraction is 55-65% by visual estimate.  1. Nonobstructive CAD. The stents in the LAD are widely patent. 2. Normal LV function 3. Normal LVEDP Plan: continue medical management. May be a candidate for DC this PM.   Disposition   Pt is being discharged home today in good condition.  Follow-up Plans & Appointments    Follow-up Information    Imogene Burn, PA-C Follow up on 05/21/2019.   Specialty: Cardiology Why: 1:30pm Contact information: [place] [phone]            Discharge Medications   Allergies as of 05/04/2019      Reactions   Lipitor [atorvastatin]    Muscles aches      Medication List    TAKE these medications   ALPRAZolam 0.5 MG tablet Commonly known as: XANAX Take 0.5 mg by mouth 2 (two) times daily as needed.   aspirin EC 81 MG tablet Take 1 tablet (81 mg total) by mouth daily.   celecoxib 100 MG capsule Commonly known as: CELEBREX Take 1 capsule by mouth 2 (two) times daily as needed.   diclofenac sodium 1 % Gel Commonly known as: VOLTAREN APPLY 2 GRAMS TOPICALLY 4 TIMES DAILY What changed: See  the new instructions.   levothyroxine 75 MCG tablet Commonly known as: SYNTHROID Take 1 tablet (75 mcg total) by mouth daily before breakfast.   nitroGLYCERIN 0.4 MG SL tablet Commonly known as: NITROSTAT Place 1 tablet (0.4 mg total) under the tongue every 5 (five) minutes as needed for chest pain. X 3 doses What changed: additional instructions   rosuvastatin 40 MG tablet Commonly known as: CRESTOR Take 1 tablet (40 mg total) by mouth daily.   SUMAtriptan 25 MG tablet Commonly known as: IMITREX TAKE 1 TABLET BY MOUTH EVERY 2 HOURS AS NEEDED FOR MIGRAINE. MAY REPEAT IN 2 HOURS IF HEADACHE PERSISTS OR RECURS What changed:   how much to take  how to take this  when to take this  reasons to take this   tamsulosin 0.4 MG Caps capsule Commonly known as: FLOMAX Take 0.4 mg by mouth at bedtime.          Outstanding Labs/Studies   none  Duration of Discharge Encounter   Greater than 30 minutes including physician time.  Signed, [place], PA 05/04/2019, 4:07 PM

## 2019-05-04 NOTE — Progress Notes (Signed)
Spoke with cath lab staff, no need for PIV at this time.

## 2019-05-04 NOTE — Progress Notes (Signed)
[facility] for heparin Indication: chest pain/ACS  Allergies  Allergen Reactions  . Lipitor [Atorvastatin]     Muscles aches    Patient Measurements: Height: 5\' 7"  (170.2 cm) Weight: 149 lb 8 oz (67.8 kg) IBW/kg (Calculated) : 66.1 Heparin Dosing Weight: 66kg  Vital Signs: Temp: 98 F (36.7 C) (03/05 0002) Temp Source: Oral (03/05 0002) BP: 94/49 (03/05 0116) Pulse Rate: 57 (03/04 2115)  Labs: Recent Labs    05/03/19 1504 05/03/19 1716 05/04/19 0311  HGB 14.1  --   --   HCT 43.5  --   --   PLT 233  --   --   APTT 29  --   --   LABPROT 14.4  --   --   INR 1.1  --   --   HEPARINUNFRC  --   --  0.56  CREATININE 0.79  --   --   TROPONINIHS 2 2  --     Estimated Creatinine Clearance: 72.3 mL/min (by C-G formula based on SCr of 0.79 mg/dL).    Assessment: Pt with a hx of CAD who presented with new onset CP. Heparin has been ordered for anticoagulation. CBC within normal limit. He is not on AC outpt.   Heparin level therapeutic (0.56) on gtt at 1000 units/hr. No bleeding noted.  Goal of Therapy:  Heparin level 0.3-0.7 units/ml Monitor platelets by anticoagulation protocol: Yes   Plan:  Continue heparin infusion 1000 units/hr F/u 6 hr confirmation heparin level  Sherlon Handing, PharmD, BCPS Please see amion for complete clinical pharmacist phone list 05/04/2019 3:51 AM

## 2019-05-04 NOTE — Progress Notes (Signed)
ED CSW at [facility] ED covering [facility] on 2nd shift received a cal from [facility] stating pt is being discharged and needs a CODE 44.  CSW spoke to TO Advanced Care Supervisor who assisted the ED SW with administering the CODE 44 which was delivered to the pt telephonically by phone.  Pt provided the CSW with verbal permission for the CSW to sign the CODE 44 stating pt voived understanding.  Pt voiced understanding and was appreciative of the CSW.  CSW printed a copy of the CODE 44, sent it to the [facility] CN who provided the CODE 44 to the pt in printed 64.  Please reconsult if future social work needs arise.  CSW signing off, as social work intervention is no longer needed.  Alphonse Guild. Yenny Kosa, LCSW, LCAS, CSI Transitions of Care Clinical Social Worker Care Coordination Department Ph: [phone]

## 2019-05-04 NOTE — H&P (View-Only) (Signed)
Progress Note  Patient Name: Keith Olson. Date of Encounter: 05/04/2019  Primary Cardiologist: Rozann Lesches, MD   Subjective   Pt chest pain free now. Home nitro did not relieve CP - nitro was 79 years old. Nitro at [employer] did relieve his chest pain. Feels like prior angina.   Pending COVID-19 test.  Inpatient Medications    Scheduled Meds: . aspirin EC  81 mg Oral Daily  . clopidogrel  75 mg Oral Daily  . [START ON 05/05/2019] influenza vaccine adjuvanted  0.5 mL Intramuscular Tomorrow-1000  . levothyroxine  75 mcg Oral Q0600  . [START ON 05/05/2019] pneumococcal 23 valent vaccine  0.5 mL Intramuscular Tomorrow-1000  . rosuvastatin  40 mg Oral Daily  . sodium chloride flush  3 mL Intravenous Once  . tamsulosin  0.4 mg Oral QHS   Continuous Infusions: . heparin 1,000 Units/hr (05/04/19 0600)  . nitroGLYCERIN 10 mcg/min (05/04/19 0600)   PRN Meds: acetaminophen, ALPRAZolam, diclofenac sodium, ondansetron (ZOFRAN) IV, SUMAtriptan   Vital Signs    Vitals:   05/04/19 0116 05/04/19 0316 05/04/19 0416 05/04/19 0444  BP: (!) 94/49 118/69 103/60   Pulse:   (!) 52   Resp: 18  17   Temp:    (!) 97.5 F (36.4 C)  TempSrc:    Oral  SpO2: 100%  100%   Weight:   67.4 kg   Height:        Intake/Output Summary (Last 24 hours) at 05/04/2019 0649 Last data filed at 05/04/2019 0600 Gross per 24 hour  Intake 194.96 ml  Output 300 ml  Net -105.04 ml   Last 3 Weights 05/04/2019 05/03/2019 05/03/2019  Weight (lbs) 148 lb 8 oz 149 lb 8 oz 145 lb  Weight (kg) 67.359 kg 67.813 kg 65.772 kg      Telemetry    Sinus bradycardia in the 50s - Personally Reviewed  ECG    No new tracings - Personally Reviewed  Physical Exam   GEN: No acute distress.   Neck: No JVD Cardiac: RRR, no murmurs, rubs, or gallops.  Respiratory: Clear to auscultation bilaterally. GI: Soft, nontender, non-distended  MS: No edema; No deformity. Neuro:  Nonfocal  Psych: Normal affect   Labs    High  Sensitivity Troponin:   Recent Labs  Lab 05/03/19 1504 05/03/19 1716  TROPONINIHS 2 2      Chemistry Recent Labs  Lab 05/03/19 1504  NA 137  K 3.7  CL 105  CO2 24  GLUCOSE 106*  BUN 12  CREATININE 0.79  CALCIUM 9.1  GFRNONAA >60  GFRAA >60  ANIONGAP 8     Hematology Recent Labs  Lab 05/03/19 1504  WBC 8.5  RBC 4.57  HGB 14.1  HCT 43.5  MCV 95.2  MCH 30.9  MCHC 32.4  RDW 13.0  PLT 233    BNPNo results for input(s): BNP, PROBNP in the last 168 hours.   DDimer No results for input(s): DDIMER in the last 168 hours.   Radiology    DG Chest 2 View  Result Date: 05/03/2019 CLINICAL DATA:  Chest pain EXAM: CHEST - 2 VIEW COMPARISON:  Radiograph 09/24/2013 FINDINGS: No consolidation, features of edema, pneumothorax, or effusion. Pulmonary vascularity is normally distributed. The aorta is calcified. Coronary stent projects over the heart on lateral film. The remaining cardiomediastinal contours are unremarkable. No acute osseous or soft tissue abnormality. Degenerative changes are present in the imaged spine and shoulders. IMPRESSION: No acute cardiopulmonary disease. Electronically Signed  By: Lovena Le M.D.   On: 05/03/2019 15:44    Cardiac Studies   CORONARY STENT INTERVENTION7/9/19  LEFT HEART CATH AND CORONARY ANGIOGRAPHY  Conclusion     Prox RCA lesion is 40% stenosed.  Dist RCA lesion is 40% stenosed.  Mid RCA lesion is 30% stenosed.  RPDA lesion is 50% stenosed.  Prox Cx lesion is 20% stenosed.  Ost LAD to Prox LAD lesion is 40% stenosed.  Prox LAD to Mid LAD lesion is 95% stenosed.  A drug-eluting stent was successfully placed using a STENT SYNERGY DES [id number].  Post intervention, there is a 0% residual stenosis.  Post intervention, there is a 0% residual stenosis.  The left ventricular systolic function is normal.  LV end diastolic pressure is normal.  The left ventricular ejection fraction is 55-65% by visual  estimate.  There is no mitral valve regurgitation.  1. Patent proximal LAD stents with mild to moderate restenosis. Severe stenosis mid LAD beyond the old stents.  2. Successful PTCA/DES x 1 proximal and mid LAD 3. Moderate non-obstructive disease in the RCA 4. Normal LV systolic function  Recommendations: Will continue statin. No beta blocker with bradycardia. Aggrastat drip for 6 hours given thrombus in vessel during PCI.  Recommend uninterrupted dual antiplatelet therapy with Aspirin 81mg  daily and Clopidogrel 75mg  daily for a minimum of 12 months (ACS - Class I recommendation).   Echo 09/06/17 Study Conclusions  - Left ventricle: The cavity size was normal. Wall thickness was increased increased in a pattern of mild to moderate LVH. Systolic function was normal. The estimated ejection fraction was in the range of 60% to 65%. Wall motion was normal; there were no regional wall motion abnormalities. Doppler parameters are consistent with abnormal left ventricular relaxation (grade 1 diastolic dysfunction). - Aortic valve: There was mild regurgitation. Valve area (VTI): 3.91 cm^2. Valve area (Vmax): 3.45 cm^2. Valve area (Vmean): 3.68 cm^2. - Technically adequate study.   Patient Profile     79 y.o. male with past medical history of CAD (s/p BMS to LAD 2001, DES to prox LAD 2017, patent stents by cath 08/2016, DES to prox/mid LAD 08/2017), hyperlpidemia, hypothyroidism and agent orange exposure who presented to [facility] on 05/03/2019 with chest pain concerning for unstable angina.  Assessment & Plan    1. Unstable angina 2. Known CAD s/p LAD stent with mild to moderate restenosis by heart cath 2019 treated with DES to midLAD - known moderate nonobstructive CAD in RCA - pt presents with symptoms concerning for unstable angina - started three days ago - hs troponin x 2 negative - EKG with TWI inferior leads, new - CP improved on heprin and nitro  gtt's - will plan for repeat cardiac catheterization today - COVID-19 pending - continue ASA and plavix   3. Hypertension - no home medications listed - pressure marginal overnight   4. Hyperlipidemia - on 40 mg crestor - 05/04/2019: Cholesterol 214; HDL 34; LDL Cholesterol 154; Triglycerides 129; VLDL 26   5. Sinus bradycardia - no BB      For questions or updates, please contact [place] Please consult [email or web address] for contact info under        Signed, Ledora Bottcher, PA  05/04/2019, 6:49 AM    Patient seen, examined. Available data reviewed. Agree with findings, assessment, and plan as outlined by Doreene Adas, PA.  The patient is currently chest pain-free on IV heparin and nitroglycerin.  He remains on dual antiplatelet therapy with aspirin  and clopidogrel.  On my exam, he is alert, oriented, in no distress.  HEENT is normal, JVP is normal, lungs are clear, heart is regular rate and rhythm no murmur gallop, skin is warm dry with no rash, abdomen is soft and nontender, extremities have no edema.  EKG shows sinus rhythm with nonspecific T wave abnormality.  Troponins are negative x2.  Despite the negative high-sensitivity troponins, the patient presents with classic symptoms of unstable angina.  He states that his chest pressure and shortness of breath with activity are exactly like what he is experienced in the past when he has been found to have obstructive CAD. Chest pain resolved with initiation of IV NTG.  I have recommended definitive evaluation with cardiac cath and possible PCI. I have reviewed the risks, indications, and alternatives to cardiac catheterization, possible angioplasty, and stenting with the patient. Risks include but are not limited to bleeding, infection, vascular injury, stroke, myocardial infection, arrhythmia, kidney injury, radiation-related injury in the case of prolonged fluoroscopy use, emergency cardiac surgery, and death. The patient understands  the risks of serious complication is 1-2 in 1443 with diagnostic cardiac cath and 1-2% or less with angioplasty/stenting.   Sherren Mocha, M.D. 05/04/2019 9:19 AM

## 2019-05-04 NOTE — Interval H&P Note (Signed)
History and Physical Interval Note:  05/04/2019 2:24 PM  Keith Olson.  has presented today for surgery, with the diagnosis of unstable angina.  The various methods of treatment have been discussed with the patient and family. After consideration of risks, benefits and other options for treatment, the patient has consented to  Procedure(s): LEFT HEART CATH AND CORONARY ANGIOGRAPHY (N/A) as a surgical intervention.  The patient's history has been reviewed, patient examined, no change in status, stable for surgery.  I have reviewed the patient's chart and labs.  Questions were answered to the patient's satisfaction.   Cath Lab Visit (complete for each Cath Lab visit)  Clinical Evaluation Leading to the Procedure:   ACS: Yes.    Non-ACS:    Anginal Classification: CCS III  Anti-ischemic medical therapy: Maximal Therapy (2 or more classes of medications)  Non-Invasive Test Results: No non-invasive testing performed  Prior CABG: No previous CABG        Collier [facility] [facility] 05/04/2019 2:24 PM

## 2019-05-04 NOTE — Progress Notes (Signed)
Code 44 noted - attempted to contact CM on call. No success. Spoke with TOC supervisor, Evelena Peat, who was able to get things worked out. Code 44 paper given to paper, AVS printed and reviewed with patient by primary nurse Otila Kluver.

## 2019-05-04 NOTE — Progress Notes (Addendum)
Progress Note  Patient Name: Keith Olson. Date of Encounter: 05/04/2019  Primary Cardiologist: Rozann Lesches, MD   Subjective   Pt chest pain free now. Home nitro did not relieve CP - nitro was 79 years old. Nitro at [employer] did relieve his chest pain. Feels like prior angina.   Pending COVID-19 test.  Inpatient Medications    Scheduled Meds: . aspirin EC  81 mg Oral Daily  . clopidogrel  75 mg Oral Daily  . [START ON 05/05/2019] influenza vaccine adjuvanted  0.5 mL Intramuscular Tomorrow-1000  . levothyroxine  75 mcg Oral Q0600  . [START ON 05/05/2019] pneumococcal 23 valent vaccine  0.5 mL Intramuscular Tomorrow-1000  . rosuvastatin  40 mg Oral Daily  . sodium chloride flush  3 mL Intravenous Once  . tamsulosin  0.4 mg Oral QHS   Continuous Infusions: . heparin 1,000 Units/hr (05/04/19 0600)  . nitroGLYCERIN 10 mcg/min (05/04/19 0600)   PRN Meds: acetaminophen, ALPRAZolam, diclofenac sodium, ondansetron (ZOFRAN) IV, SUMAtriptan   Vital Signs    Vitals:   05/04/19 0116 05/04/19 0316 05/04/19 0416 05/04/19 0444  BP: (!) 94/49 118/69 103/60   Pulse:   (!) 52   Resp: 18  17   Temp:    (!) 97.5 F (36.4 C)  TempSrc:    Oral  SpO2: 100%  100%   Weight:   67.4 kg   Height:        Intake/Output Summary (Last 24 hours) at 05/04/2019 0649 Last data filed at 05/04/2019 0600 Gross per 24 hour  Intake 194.96 ml  Output 300 ml  Net -105.04 ml   Last 3 Weights 05/04/2019 05/03/2019 05/03/2019  Weight (lbs) 148 lb 8 oz 149 lb 8 oz 145 lb  Weight (kg) 67.359 kg 67.813 kg 65.772 kg      Telemetry    Sinus bradycardia in the 50s - Personally Reviewed  ECG    No new tracings - Personally Reviewed  Physical Exam   GEN: No acute distress.   Neck: No JVD Cardiac: RRR, no murmurs, rubs, or gallops.  Respiratory: Clear to auscultation bilaterally. GI: Soft, nontender, non-distended  MS: No edema; No deformity. Neuro:  Nonfocal  Psych: Normal affect   Labs    High  Sensitivity Troponin:   Recent Labs  Lab 05/03/19 1504 05/03/19 1716  TROPONINIHS 2 2      Chemistry Recent Labs  Lab 05/03/19 1504  NA 137  K 3.7  CL 105  CO2 24  GLUCOSE 106*  BUN 12  CREATININE 0.79  CALCIUM 9.1  GFRNONAA >60  GFRAA >60  ANIONGAP 8     Hematology Recent Labs  Lab 05/03/19 1504  WBC 8.5  RBC 4.57  HGB 14.1  HCT 43.5  MCV 95.2  MCH 30.9  MCHC 32.4  RDW 13.0  PLT 233    BNPNo results for input(s): BNP, PROBNP in the last 168 hours.   DDimer No results for input(s): DDIMER in the last 168 hours.   Radiology    DG Chest 2 View  Result Date: 05/03/2019 CLINICAL DATA:  Chest pain EXAM: CHEST - 2 VIEW COMPARISON:  Radiograph 09/24/2013 FINDINGS: No consolidation, features of edema, pneumothorax, or effusion. Pulmonary vascularity is normally distributed. The aorta is calcified. Coronary stent projects over the heart on lateral film. The remaining cardiomediastinal contours are unremarkable. No acute osseous or soft tissue abnormality. Degenerative changes are present in the imaged spine and shoulders. IMPRESSION: No acute cardiopulmonary disease. Electronically Signed  By: Lovena Le M.D.   On: 05/03/2019 15:44    Cardiac Studies   CORONARY STENT INTERVENTION7/9/19  LEFT HEART CATH AND CORONARY ANGIOGRAPHY  Conclusion     Prox RCA lesion is 40% stenosed.  Dist RCA lesion is 40% stenosed.  Mid RCA lesion is 30% stenosed.  RPDA lesion is 50% stenosed.  Prox Cx lesion is 20% stenosed.  Ost LAD to Prox LAD lesion is 40% stenosed.  Prox LAD to Mid LAD lesion is 95% stenosed.  A drug-eluting stent was successfully placed using a STENT SYNERGY DES [id number].  Post intervention, there is a 0% residual stenosis.  Post intervention, there is a 0% residual stenosis.  The left ventricular systolic function is normal.  LV end diastolic pressure is normal.  The left ventricular ejection fraction is 55-65% by visual  estimate.  There is no mitral valve regurgitation.  1. Patent proximal LAD stents with mild to moderate restenosis. Severe stenosis mid LAD beyond the old stents.  2. Successful PTCA/DES x 1 proximal and mid LAD 3. Moderate non-obstructive disease in the RCA 4. Normal LV systolic function  Recommendations: Will continue statin. No beta blocker with bradycardia. Aggrastat drip for 6 hours given thrombus in vessel during PCI.  Recommend uninterrupted dual antiplatelet therapy with Aspirin 81mg  daily and Clopidogrel 75mg  daily for a minimum of 12 months (ACS - Class I recommendation).   Echo 09/06/17 Study Conclusions  - Left ventricle: The cavity size was normal. Wall thickness was increased increased in a pattern of mild to moderate LVH. Systolic function was normal. The estimated ejection fraction was in the range of 60% to 65%. Wall motion was normal; there were no regional wall motion abnormalities. Doppler parameters are consistent with abnormal left ventricular relaxation (grade 1 diastolic dysfunction). - Aortic valve: There was mild regurgitation. Valve area (VTI): 3.91 cm^2. Valve area (Vmax): 3.45 cm^2. Valve area (Vmean): 3.68 cm^2. - Technically adequate study.   Patient Profile     79 y.o. male with past medical history of CAD (s/p BMS to LAD 2001, DES to prox LAD 2017, patent stents by cath 08/2016, DES to prox/mid LAD 08/2017), hyperlpidemia, hypothyroidism and agent orange exposure who presented to [facility] on 05/03/2019 with chest pain concerning for unstable angina.  Assessment & Plan    1. Unstable angina 2. Known CAD s/p LAD stent with mild to moderate restenosis by heart cath 2019 treated with DES to midLAD - known moderate nonobstructive CAD in RCA - pt presents with symptoms concerning for unstable angina - started three days ago - hs troponin x 2 negative - EKG with TWI inferior leads, new - CP improved on heprin and nitro  gtt's - will plan for repeat cardiac catheterization today - COVID-19 pending - continue ASA and plavix   3. Hypertension - no home medications listed - pressure marginal overnight   4. Hyperlipidemia - on 40 mg crestor - 05/04/2019: Cholesterol 214; HDL 34; LDL Cholesterol 154; Triglycerides 129; VLDL 26   5. Sinus bradycardia - no BB      For questions or updates, please contact [place] Please consult [email or web address] for contact info under        Signed, Ledora Bottcher, PA  05/04/2019, 6:49 AM    Patient seen, examined. Available data reviewed. Agree with findings, assessment, and plan as outlined by Doreene Adas, PA.  The patient is currently chest pain-free on IV heparin and nitroglycerin.  He remains on dual antiplatelet therapy with aspirin  and clopidogrel.  On my exam, he is alert, oriented, in no distress.  HEENT is normal, JVP is normal, lungs are clear, heart is regular rate and rhythm no murmur gallop, skin is warm dry with no rash, abdomen is soft and nontender, extremities have no edema.  EKG shows sinus rhythm with nonspecific T wave abnormality.  Troponins are negative x2.  Despite the negative high-sensitivity troponins, the patient presents with classic symptoms of unstable angina.  He states that his chest pressure and shortness of breath with activity are exactly like what he is experienced in the past when he has been found to have obstructive CAD. Chest pain resolved with initiation of IV NTG.  I have recommended definitive evaluation with cardiac cath and possible PCI. I have reviewed the risks, indications, and alternatives to cardiac catheterization, possible angioplasty, and stenting with the patient. Risks include but are not limited to bleeding, infection, vascular injury, stroke, myocardial infection, arrhythmia, kidney injury, radiation-related injury in the case of prolonged fluoroscopy use, emergency cardiac surgery, and death. The patient understands  the risks of serious complication is 1-2 in 9702 with diagnostic cardiac cath and 1-2% or less with angioplasty/stenting.   Sherren Mocha, M.D. 05/04/2019 9:19 AM

## 2019-05-16 NOTE — Progress Notes (Deleted)
Cardiology Office Note    Date:  05/16/2019   ID:  Keith Olson., DOB 12-Nov-1940, MRN [id number]  PCP:  Dettinger, Fransisca Kaufmann, MD  Cardiologist: Rozann Lesches, MD EPS: None  No chief complaint on file.   History of Present Illness:  Keith Olson. is a 79 y.o. male with history of CAD (s/p BMS to LAD 2001, DES to prox LAD 2017, patent stents by cath 08/2016, DES to prox/mid LAD7/2019),  hyperlpidemia, hypothyroidism and agent orange exposure   Patient admitted with chest pain 04/2019 and underwent cath that showed patent stents and nonobstructive RCA. Continue on ASA, no Beta blocker due to bradycardia.  LDL 154 on Crestor 40 mg daily allergy to atorvastatin.  May benefit from lipid clinic referral.   Past Medical History:  Diagnosis Date  . Agent orange exposure   . Arthritis   . Coronary artery disease    BMS to mid LAD 2001, DES to proximal LAD 2017  . Enlarged prostate    XRT 2016  . Headache    Sinus headaches   . History of depression   . Hyperlipidemia   . Hypothyroidism   . Prostate cancer ([place]) 07/2014   hormonal therapy, external beam radiation therapy  . PTSD (post-traumatic stress disorder)     Past Surgical History:  Procedure Laterality Date  . CARDIAC CATHETERIZATION N/A 12/12/2015   Procedure: Left Heart Cath and Coronary Angiography;  Surgeon: Peter M Martinique, MD;  Location: [facility];  Service: Cardiovascular;  Laterality: N/A;  . CARDIAC CATHETERIZATION N/A 12/12/2015   Procedure: Coronary Stent Intervention;  Surgeon: Peter M Martinique, MD;  Location: [facility];  Service: Cardiovascular;  Laterality: N/A;  . CORONARY STENT INTERVENTION N/A 09/06/2017   Procedure: CORONARY STENT INTERVENTION;  Surgeon: Burnell Blanks, MD;  Location: [facility];  Service: Cardiovascular;  Laterality: N/A;  . HERNIA REPAIR Right   . LEFT HEART CATH AND CORONARY ANGIOGRAPHY N/A 09/02/2016   Procedure: Left Heart Cath and Coronary  Angiography;  Surgeon: Leonie Man, MD;  Location: [facility];  Service: Cardiovascular;  Laterality: N/A;  . LEFT HEART CATH AND CORONARY ANGIOGRAPHY N/A 09/06/2017   Procedure: LEFT HEART CATH AND CORONARY ANGIOGRAPHY;  Surgeon: Burnell Blanks, MD;  Location: [facility];  Service: Cardiovascular;  Laterality: N/A;  . LEFT HEART CATH AND CORONARY ANGIOGRAPHY N/A 05/04/2019   Procedure: LEFT HEART CATH AND CORONARY ANGIOGRAPHY;  Surgeon: Martinique, Peter M, MD;  Location: [facility];  Service: Cardiovascular;  Laterality: N/A;  . PROSTATE BIOPSY N/A 08/28/2014   Procedure: BIOPSY TRANSRECTAL ULTRASONIC PROSTATE (TUBP);  Surgeon: Rana Snare, MD;  Location: [facility];  Service: Urology;  Laterality: N/A;  . TRANSURETHRAL RESECTION OF PROSTATE N/A 08/28/2014   Procedure: TRANSURETHRAL RESECTION OF THE PROSTATE WITH GYRUS INSTRUMENTS;  Surgeon: Rana Snare, MD;  Location: [facility];  Service: Urology;  Laterality: N/A;    Current Medications: No outpatient medications have been marked as taking for the 05/21/19 encounter (Appointment) with Imogene Burn, PA-C.     Allergies:   Lipitor [atorvastatin]   Social History   Socioeconomic History  . Marital status: Married    Spouse name: Not on file  . Number of children: Not on file  . Years of education: Not on file  . Highest education level: Not on file  Occupational History  . Occupation: retired  Tobacco Use  . Smoking status: Never Smoker  .  Smokeless tobacco: Former Systems developer    Types: Chew  Substance and Sexual Activity  . Alcohol use: No  . Drug use: No  . Sexual activity: Yes  Other Topics Concern  . Not on file  Social History Narrative   Married   5 children   Social Determinants of Health   Financial Resource Strain:   . Difficulty of Paying Living Expenses:   Food Insecurity:   . Worried About Charity fundraiser in the Last Year:   . Arboriculturist in the Last Year:   Transportation Needs:    . Film/video editor (Medical):   Marland Kitchen Lack of Transportation (Non-Medical):   Physical Activity:   . Days of Exercise per Week:   . Minutes of Exercise per Session:   Stress:   . Feeling of Stress :   Social Connections:   . Frequency of Communication with Friends and Family:   . Frequency of Social Gatherings with Friends and Family:   . Attends Religious Services:   . Active Member of Clubs or Organizations:   . Attends Archivist Meetings:   Marland Kitchen Marital Status:      Family History:  The patient's ***family history includes Arthritis in his mother; Heart attack in his father.   ROS:   Please see the history of present illness.    ROS All other systems reviewed and are negative.   PHYSICAL EXAM:   VS:  There were no vitals taken for this visit.  Physical Exam  GEN: Well nourished, well developed, in no acute distress  HEENT: normal  Neck: no JVD, carotid bruits, or masses Cardiac:RRR; no murmurs, rubs, or gallops  Respiratory:  clear to auscultation bilaterally, normal work of breathing GI: soft, nontender, nondistended, + BS Ext: without cyanosis, clubbing, or edema, Good distal pulses bilaterally MS: no deformity or atrophy  Skin: warm and dry, no rash Neuro:  Alert and Oriented x 3, Strength and sensation are intact Psych: euthymic mood, full affect  Wt Readings from Last 3 Encounters:  05/04/19 148 lb 8 oz (67.4 kg)  11/13/18 152 lb 3.2 oz (69 kg)  05/22/18 161 lb 12.8 oz (73.4 kg)      Studies/Labs Reviewed:   EKG:  EKG is*** ordered today.  The ekg ordered today demonstrates ***  Recent Labs: 11/13/2018: ALT 23 05/03/2019: BUN 12; Creatinine, Ser 0.79; Hemoglobin 14.1; Platelets 233; Potassium 3.7; Sodium 137; TSH 3.193   Lipid Panel    Component Value Date/Time   CHOL 214 (H) 05/04/2019 0311   TRIG 129 05/04/2019 0311   HDL 34 (L) 05/04/2019 0311   CHOLHDL 6.3 05/04/2019 0311   VLDL 26 05/04/2019 0311   LDLCALC 154 (H) 05/04/2019 0311     Additional studies/ records that were reviewed today include:  Left heart cath 05/04/19:  Previously placed Prox LAD to Mid LAD drug eluting stent is widely patent.  Balloon angioplasty was performed.  Previously placed Ost LAD to Prox LAD drug eluting stent is widely patent.  Balloon angioplasty was performed.  Prox Cx lesion is 20% stenosed.  Prox RCA lesion is 40% stenosed.  Dist RCA lesion is 40% stenosed.  Mid RCA lesion is 30% stenosed.  RPDA lesion is 50% stenosed.  The left ventricular systolic function is normal.  LV end diastolic pressure is normal.  The left ventricular ejection fraction is 55-65% by visual estimate.   1. Nonobstructive CAD. The stents in the LAD are widely patent. 2. Normal LV  function 3. Normal LVEDP   Plan: continue medical management. May be a candidate for DC this PM. _____________     ASSESSMENT:    1. Coronary artery disease involving native coronary artery of native heart without angina pectoris   2. Mixed hyperlipidemia      PLAN:  In order of problems listed above:   CAD s/p BMS to LAD 2001, DES to prox LAD 2017, patent stents by cath 08/2016, DES to prox/mid LAD7/2019,  chest pain 05/04/2019 and underwent cath that showed patent stents and nonobstructive RCA.   [id number] on crestor with atorvastatin allergy. Refer to lipid clinic      Medication Adjustments/Labs and Tests Ordered: Current medicines are reviewed at length with the patient today.  Concerns regarding medicines are outlined above.  Medication changes, Labs and Tests ordered today are listed in the Patient Instructions below. There are no Patient Instructions on file for this visit.   Signed, Ermalinda Barrios, PA-C  05/16/2019 1:17 PM    [facility] [address], [place], [place]  [address] Phone: [phone]; Fax: [phone]

## 2019-05-21 ENCOUNTER — Ambulatory Visit: Payer: Medicare Other | Admitting: Physician Assistant

## 2019-05-24 ENCOUNTER — Encounter: Payer: Self-pay | Admitting: Physician Assistant

## 2019-06-20 DIAGNOSIS — M130 Polyarthritis, unspecified: Secondary | ICD-10-CM | POA: Diagnosis not present

## 2019-06-20 DIAGNOSIS — E039 Hypothyroidism, unspecified: Secondary | ICD-10-CM | POA: Diagnosis not present

## 2019-06-20 DIAGNOSIS — I10 Essential (primary) hypertension: Secondary | ICD-10-CM | POA: Diagnosis not present

## 2019-06-20 DIAGNOSIS — R748 Abnormal levels of other serum enzymes: Secondary | ICD-10-CM | POA: Diagnosis not present

## 2019-06-26 ENCOUNTER — Other Ambulatory Visit: Payer: Self-pay

## 2019-06-27 ENCOUNTER — Emergency Department (HOSPITAL_COMMUNITY): Payer: Medicare Other

## 2019-06-27 ENCOUNTER — Inpatient Hospital Stay (HOSPITAL_COMMUNITY)
Admission: EM | Admit: 2019-06-27 | Discharge: 2019-07-06 | DRG: 543 | Disposition: A | Discharge status: Home Health | Payer: Medicare Other | Attending: Internal Medicine | Admitting: Internal Medicine

## 2019-06-27 ENCOUNTER — Other Ambulatory Visit: Payer: Self-pay

## 2019-06-27 ENCOUNTER — Encounter (HOSPITAL_COMMUNITY): Payer: Self-pay | Admitting: Emergency Medicine

## 2019-06-27 DIAGNOSIS — M8458XA Pathological fracture in neoplastic disease, other specified site, initial encounter for fracture: Secondary | ICD-10-CM | POA: Diagnosis present

## 2019-06-27 DIAGNOSIS — Z20822 Contact with and (suspected) exposure to covid-19: Secondary | ICD-10-CM | POA: Diagnosis not present

## 2019-06-27 DIAGNOSIS — C7951 Secondary malignant neoplasm of bone: Secondary | ICD-10-CM | POA: Diagnosis not present

## 2019-06-27 DIAGNOSIS — R110 Nausea: Secondary | ICD-10-CM | POA: Diagnosis not present

## 2019-06-27 DIAGNOSIS — T402X5A Adverse effect of other opioids, initial encounter: Secondary | ICD-10-CM | POA: Diagnosis not present

## 2019-06-27 DIAGNOSIS — Z955 Presence of coronary angioplasty implant and graft: Secondary | ICD-10-CM

## 2019-06-27 DIAGNOSIS — R634 Abnormal weight loss: Secondary | ICD-10-CM | POA: Diagnosis present

## 2019-06-27 DIAGNOSIS — R590 Localized enlarged lymph nodes: Secondary | ICD-10-CM | POA: Diagnosis not present

## 2019-06-27 DIAGNOSIS — I7789 Other specified disorders of arteries and arterioles: Secondary | ICD-10-CM | POA: Diagnosis not present

## 2019-06-27 DIAGNOSIS — Z9079 Acquired absence of other genital organ(s): Secondary | ICD-10-CM

## 2019-06-27 DIAGNOSIS — M48061 Spinal stenosis, lumbar region without neurogenic claudication: Secondary | ICD-10-CM | POA: Diagnosis not present

## 2019-06-27 DIAGNOSIS — M5442 Lumbago with sciatica, left side: Secondary | ICD-10-CM | POA: Diagnosis not present

## 2019-06-27 DIAGNOSIS — C7802 Secondary malignant neoplasm of left lung: Secondary | ICD-10-CM | POA: Diagnosis present

## 2019-06-27 DIAGNOSIS — C61 Malignant neoplasm of prostate: Secondary | ICD-10-CM | POA: Diagnosis not present

## 2019-06-27 DIAGNOSIS — Z923 Personal history of irradiation: Secondary | ICD-10-CM

## 2019-06-27 DIAGNOSIS — R896 Abnormal cytological findings in specimens from other organs, systems and tissues: Secondary | ICD-10-CM | POA: Diagnosis not present

## 2019-06-27 DIAGNOSIS — K219 Gastro-esophageal reflux disease without esophagitis: Secondary | ICD-10-CM | POA: Diagnosis not present

## 2019-06-27 DIAGNOSIS — Z8659 Personal history of other mental and behavioral disorders: Secondary | ICD-10-CM | POA: Diagnosis not present

## 2019-06-27 DIAGNOSIS — M5440 Lumbago with sciatica, unspecified side: Secondary | ICD-10-CM | POA: Diagnosis not present

## 2019-06-27 DIAGNOSIS — R338 Other retention of urine: Secondary | ICD-10-CM | POA: Diagnosis not present

## 2019-06-27 DIAGNOSIS — C801 Malignant (primary) neoplasm, unspecified: Secondary | ICD-10-CM | POA: Diagnosis not present

## 2019-06-27 DIAGNOSIS — R42 Dizziness and giddiness: Secondary | ICD-10-CM | POA: Diagnosis not present

## 2019-06-27 DIAGNOSIS — E871 Hypo-osmolality and hyponatremia: Secondary | ICD-10-CM | POA: Diagnosis not present

## 2019-06-27 DIAGNOSIS — R4020 Unspecified coma: Secondary | ICD-10-CM | POA: Diagnosis not present

## 2019-06-27 DIAGNOSIS — I25110 Atherosclerotic heart disease of native coronary artery with unstable angina pectoris: Secondary | ICD-10-CM | POA: Diagnosis not present

## 2019-06-27 DIAGNOSIS — Z888 Allergy status to other drugs, medicaments and biological substances status: Secondary | ICD-10-CM

## 2019-06-27 DIAGNOSIS — F321 Major depressive disorder, single episode, moderate: Secondary | ICD-10-CM | POA: Diagnosis not present

## 2019-06-27 DIAGNOSIS — R918 Other nonspecific abnormal finding of lung field: Secondary | ICD-10-CM | POA: Diagnosis not present

## 2019-06-27 DIAGNOSIS — Z743 Need for continuous supervision: Secondary | ICD-10-CM | POA: Diagnosis not present

## 2019-06-27 DIAGNOSIS — Z03818 Encounter for observation for suspected exposure to other biological agents ruled out: Secondary | ICD-10-CM | POA: Diagnosis not present

## 2019-06-27 DIAGNOSIS — K5903 Drug induced constipation: Secondary | ICD-10-CM | POA: Diagnosis not present

## 2019-06-27 DIAGNOSIS — Z7982 Long term (current) use of aspirin: Secondary | ICD-10-CM

## 2019-06-27 DIAGNOSIS — Z79899 Other long term (current) drug therapy: Secondary | ICD-10-CM

## 2019-06-27 DIAGNOSIS — R1030 Lower abdominal pain, unspecified: Secondary | ICD-10-CM | POA: Diagnosis not present

## 2019-06-27 DIAGNOSIS — N401 Enlarged prostate with lower urinary tract symptoms: Secondary | ICD-10-CM | POA: Diagnosis present

## 2019-06-27 DIAGNOSIS — R001 Bradycardia, unspecified: Secondary | ICD-10-CM | POA: Diagnosis present

## 2019-06-27 DIAGNOSIS — Z8546 Personal history of malignant neoplasm of prostate: Secondary | ICD-10-CM

## 2019-06-27 DIAGNOSIS — F411 Generalized anxiety disorder: Secondary | ICD-10-CM | POA: Diagnosis present

## 2019-06-27 DIAGNOSIS — Z85118 Personal history of other malignant neoplasm of bronchus and lung: Secondary | ICD-10-CM | POA: Diagnosis not present

## 2019-06-27 DIAGNOSIS — I255 Ischemic cardiomyopathy: Secondary | ICD-10-CM | POA: Diagnosis present

## 2019-06-27 DIAGNOSIS — Z6822 Body mass index (BMI) 22.0-22.9, adult: Secondary | ICD-10-CM

## 2019-06-27 DIAGNOSIS — M5489 Other dorsalgia: Secondary | ICD-10-CM | POA: Diagnosis not present

## 2019-06-27 DIAGNOSIS — E039 Hypothyroidism, unspecified: Secondary | ICD-10-CM | POA: Diagnosis present

## 2019-06-27 DIAGNOSIS — Z79890 Hormone replacement therapy (postmenopausal): Secondary | ICD-10-CM

## 2019-06-27 DIAGNOSIS — G893 Neoplasm related pain (acute) (chronic): Secondary | ICD-10-CM | POA: Diagnosis not present

## 2019-06-27 DIAGNOSIS — K5900 Constipation, unspecified: Secondary | ICD-10-CM | POA: Diagnosis not present

## 2019-06-27 DIAGNOSIS — Z8261 Family history of arthritis: Secondary | ICD-10-CM

## 2019-06-27 DIAGNOSIS — E785 Hyperlipidemia, unspecified: Secondary | ICD-10-CM | POA: Diagnosis not present

## 2019-06-27 DIAGNOSIS — G834 Cauda equina syndrome: Secondary | ICD-10-CM | POA: Diagnosis present

## 2019-06-27 DIAGNOSIS — I2510 Atherosclerotic heart disease of native coronary artery without angina pectoris: Secondary | ICD-10-CM | POA: Diagnosis not present

## 2019-06-27 DIAGNOSIS — M549 Dorsalgia, unspecified: Secondary | ICD-10-CM | POA: Diagnosis not present

## 2019-06-27 DIAGNOSIS — Z87891 Personal history of nicotine dependence: Secondary | ICD-10-CM

## 2019-06-27 DIAGNOSIS — M5441 Lumbago with sciatica, right side: Secondary | ICD-10-CM | POA: Diagnosis not present

## 2019-06-27 DIAGNOSIS — R846 Abnormal cytological findings in specimens from respiratory organs and thorax: Secondary | ICD-10-CM | POA: Diagnosis not present

## 2019-06-27 DIAGNOSIS — Z515 Encounter for palliative care: Secondary | ICD-10-CM | POA: Diagnosis not present

## 2019-06-27 DIAGNOSIS — Z8249 Family history of ischemic heart disease and other diseases of the circulatory system: Secondary | ICD-10-CM

## 2019-06-27 DIAGNOSIS — I10 Essential (primary) hypertension: Secondary | ICD-10-CM | POA: Diagnosis not present

## 2019-06-27 LAB — COMPREHENSIVE METABOLIC PANEL
ALT: 23 U/L (ref 0–44)
AST: 28 U/L (ref 15–41)
Albumin: 4.5 g/dL (ref 3.5–5.0)
Alkaline Phosphatase: 306 U/L — ABNORMAL HIGH (ref 38–126)
Anion gap: 14 (ref 5–15)
BUN: 17 mg/dL (ref 8–23)
CO2: 23 mmol/L (ref 22–32)
Calcium: 9.6 mg/dL (ref 8.9–10.3)
Chloride: 98 mmol/L (ref 98–111)
Creatinine, Ser: 0.61 mg/dL (ref 0.61–1.24)
GFR calc Af Amer: >60 mL/min (ref >60)
GFR calc non Af Amer: >60 mL/min (ref >60)
Glucose, Bld: 137 mg/dL — ABNORMAL HIGH (ref 70–99)
Potassium: 4.2 mmol/L (ref 3.5–5.1)
Sodium: 135 mmol/L (ref 135–145)
Total Bilirubin: 0.7 mg/dL (ref 0.3–1.2)
Total Protein: 7.7 g/dL (ref 6.5–8.1)

## 2019-06-27 LAB — URINALYSIS, ROUTINE W REFLEX MICROSCOPIC
Bilirubin Urine: NEGATIVE
Glucose, UA: NEGATIVE mg/dL
Hgb urine dipstick: NEGATIVE
Ketones, ur: 5 mg/dL — AB
Leukocytes,Ua: NEGATIVE
Nitrite: NEGATIVE
Protein, ur: NEGATIVE mg/dL
Specific Gravity, Urine: 1.043 — ABNORMAL HIGH (ref 1.005–1.030)
pH: 6 (ref 5.0–8.0)

## 2019-06-27 LAB — CBC WITH DIFFERENTIAL/PLATELET
Abs Immature Granulocytes: 0.03 10*3/uL (ref 0.00–0.07)
Basophils Absolute: 0.1 10*3/uL (ref 0.0–0.1)
Basophils Relative: 1 %
Eosinophils Absolute: 0 10*3/uL (ref 0.0–0.5)
Eosinophils Relative: 0 %
HCT: 43.5 % (ref 39.0–52.0)
Hemoglobin: 14.6 g/dL (ref 13.0–17.0)
Immature Granulocytes: 0 %
Lymphocytes Relative: 14 %
Lymphs Abs: 1.1 10*3/uL (ref 0.7–4.0)
MCH: 31.7 pg (ref 26.0–34.0)
MCHC: 33.6 g/dL (ref 30.0–36.0)
MCV: 94.4 fL (ref 80.0–100.0)
Monocytes Absolute: 0.5 10*3/uL (ref 0.1–1.0)
Monocytes Relative: 6 %
Neutro Abs: 6.6 10*3/uL (ref 1.7–7.7)
Neutrophils Relative %: 79 %
Platelets: 244 10*3/uL (ref 150–400)
RBC: 4.61 MIL/uL (ref 4.22–5.81)
RDW: 12.8 % (ref 11.5–15.5)
WBC: 8.4 10*3/uL (ref 4.0–10.5)
nRBC: 0 % (ref 0.0–0.2)

## 2019-06-27 LAB — RESPIRATORY PANEL BY RT PCR (FLU A&B, COVID)
Influenza A by PCR: NEGATIVE
Influenza B by PCR: NEGATIVE
SARS Coronavirus 2 by RT PCR: NEGATIVE

## 2019-06-27 IMAGING — MR MR LUMBAR SPINE WO/W CM
4 of 9 series · 12 of 48 positions shown · IV contrast (gadavist)
Comparison: None.

CLINICAL DATA: Back pain, history of prostate cancer, new suspected
lung cancer

EXAM:
MRI CERVICAL, THORACIC AND LUMBAR SPINE WITHOUT AND WITH CONTRAST
CONTRAST:  7 mL Gadavist
TECHNIQUE: Multiplanar and multiecho pulse sequences of the cervical spine, to
include the craniocervical junction and cervicothoracic junction,
and thoracic and lumbar spine, were obtained without intravenous
contrast.

[Series 1: T2 · sagittal · 4.0mm · 0.47mm/px · 3 of 15 slices shown (1 of 3)]
[im 1/15]
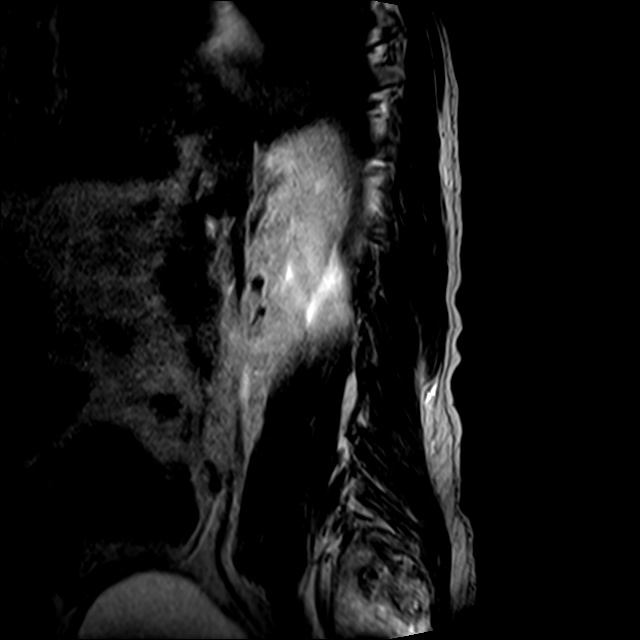
[im 10/15]
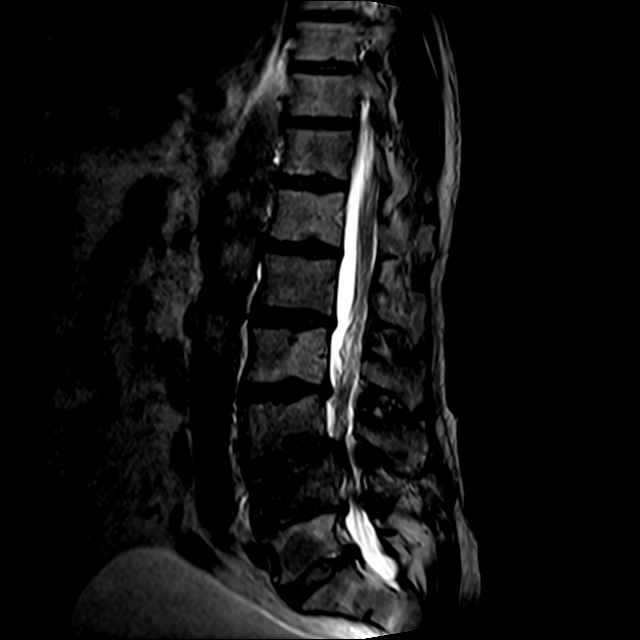
[im 15/15]
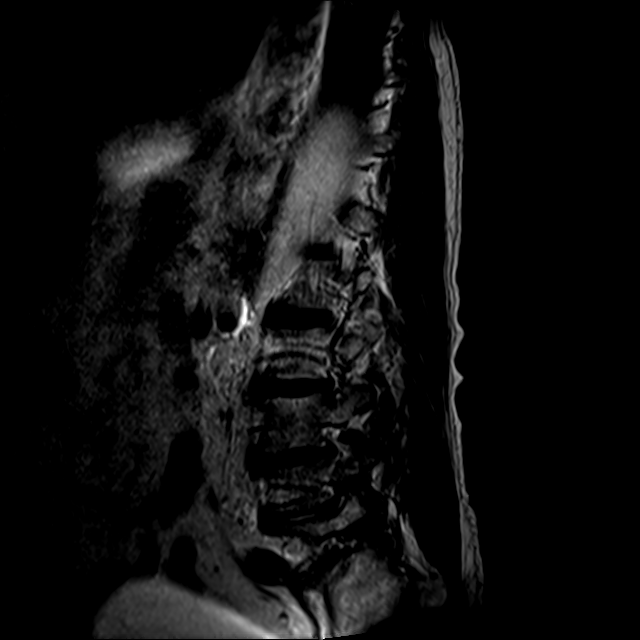

[Series 2: T1 · sagittal · 4.0mm · 0.47mm/px · 3 of 15 slices shown]
[im 1/15]
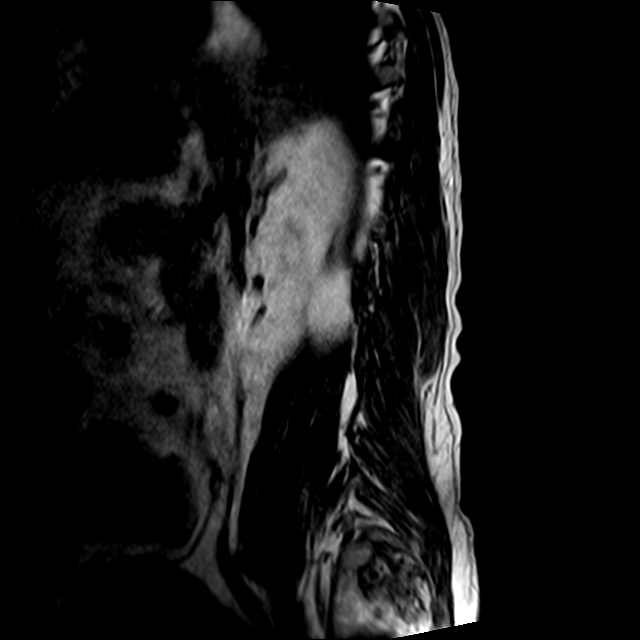
[im 10/15]
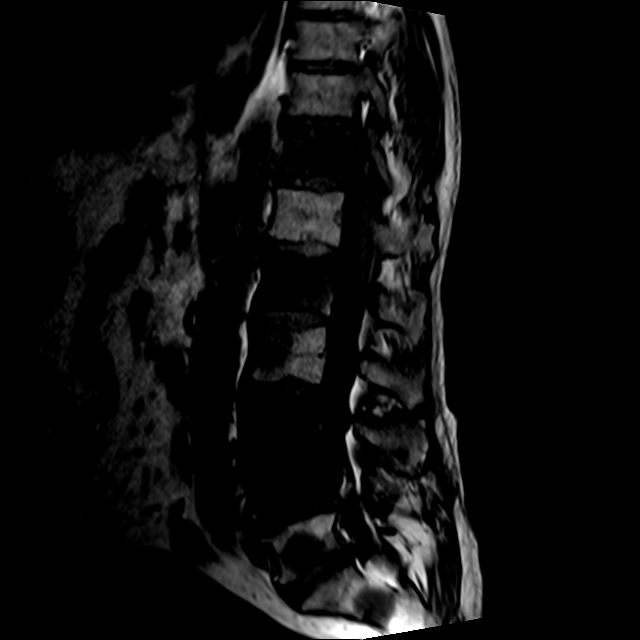
[im 15/15]
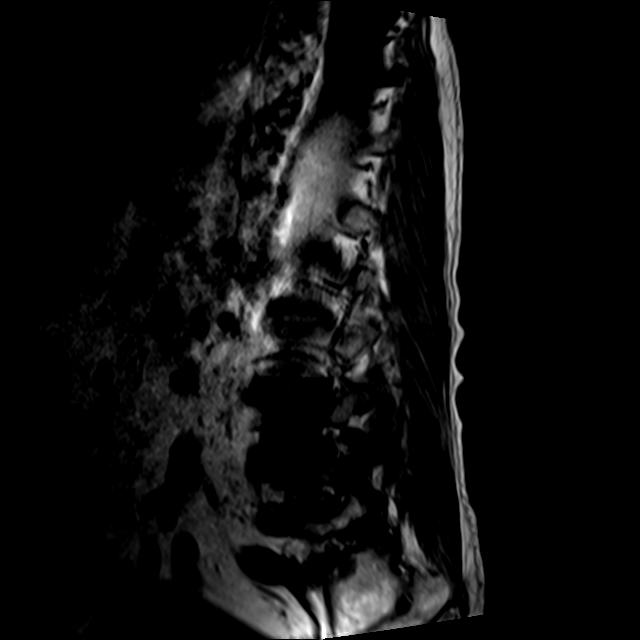

[Series 3: T2 · sagittal · 4.0mm · 0.47mm/px · 3 of 14 slices shown (2 of 3)]
[im 1/14]
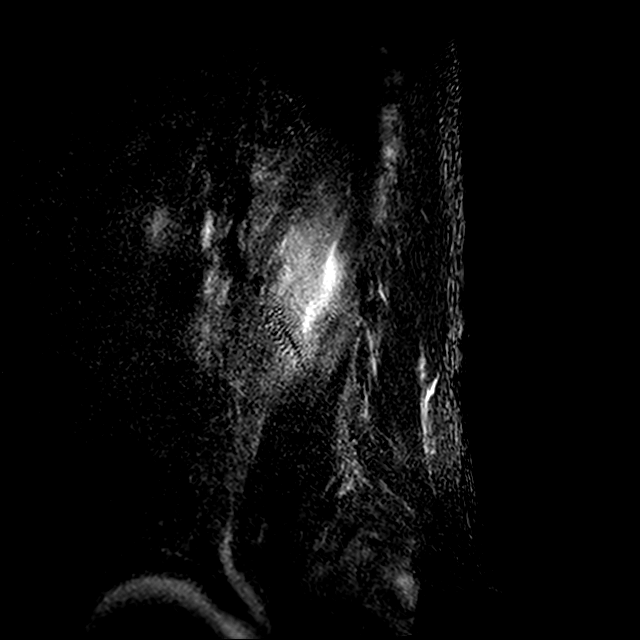
[im 7/14]
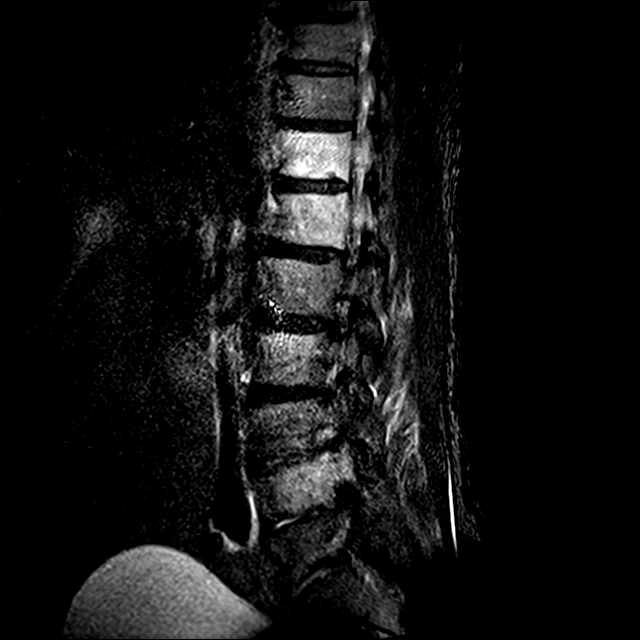
[im 14/14]
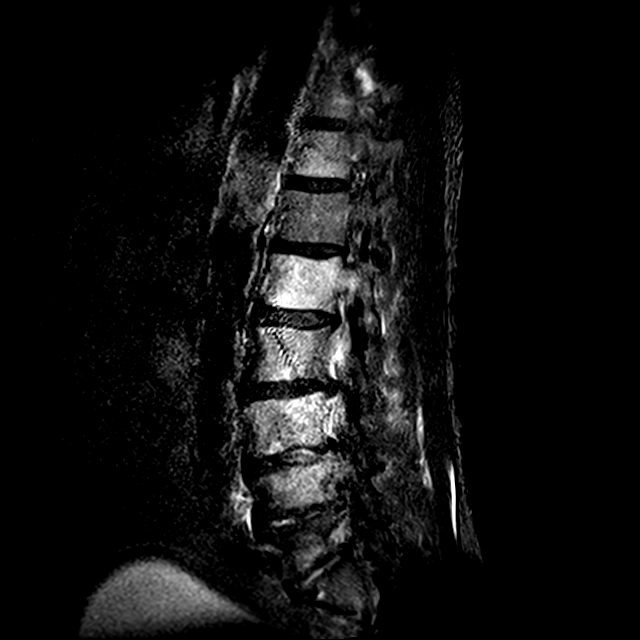

[Series 7: T2 · axial · 4.0mm · 0.41mm/px · z∈[-441,-288]mm · 3 of 42 slices shown (3 of 3)]
[im 5/42]
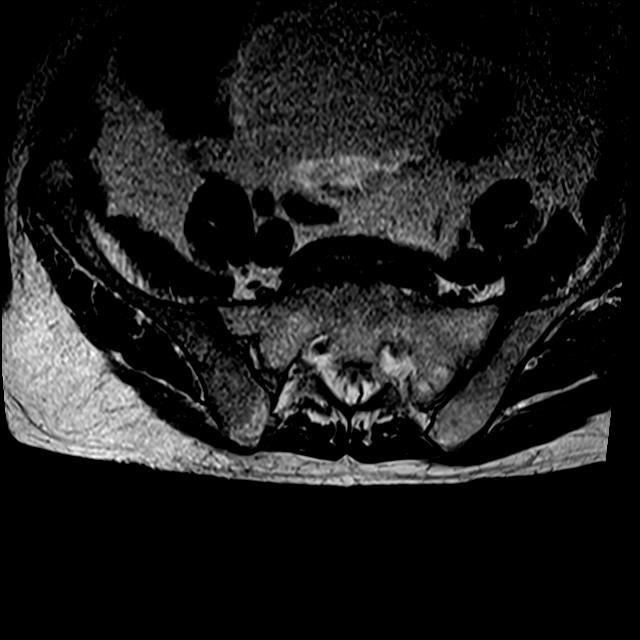
[im 23/42]
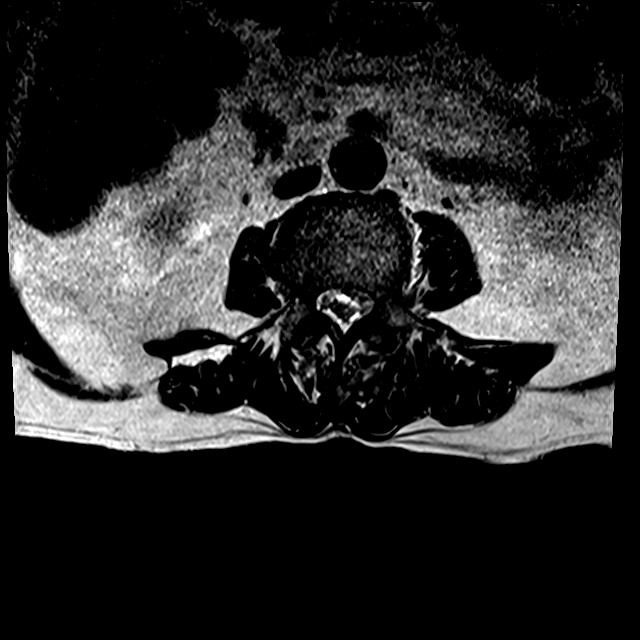
[im 37/42]
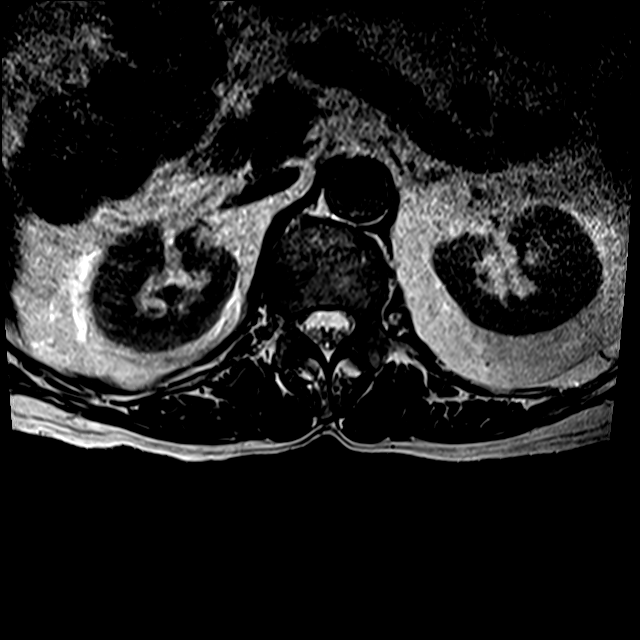

[12 of 48 positions shown; findings below may reference images not displayed]

FINDINGS: MRI CERVICAL SPINE

Motion artifact is present

Alignment: Anteroposterior alignment is maintained.

Vertebrae: There is abnormal marrow signal at multiple levels with
greatest involvement of C3 and C4 vertebral bodies.

There no compression deformity. No significant epidural disease
noted.

Cord: No definite abnormal signal within the above limitation.

Posterior Fossa, vertebral arteries, paraspinal tissues:
Unremarkable.

Disc levels: Multilevel disc bulges, endplate osteophytes, and facet
and uncovertebral hypertrophy. There is no high-grade canal
stenosis. Multilevel foraminal stenosis is present and poorly
evaluated due to artifact.

MRI THORACIC SPINE

Motion artifact is present

Alignment:  Anteroposterior alignment is maintained.

Vertebrae: Multifocal abnormal marrow signal with greatest
involvement of T2-T4 and T11 and T12. There is mild to moderate
compression deformity of T3 with less than 50% loss of height.
Probable mild ventral epidural extension at this level. There is
some foraminal extension at T2-T3, T3-T4, and T4-T5 levels.

Cord:  No abnormal signal within the above limitation.

Paraspinal and other soft tissues: Intrathoracic findings are better
evaluated on the prior chest CT.

Disc levels: Mild degenerative changes are present without
significant degenerative stenosis.

MRI LUMBAR SPINE

Motion artifact is present.

Segmentation: Standard.

Alignment:  Mild degenerative listhesis.

Vertebrae: There is multifocal abnormal marrow signal throughout the
lumbosacral spine and imaged bony pelvis. There is mild compression
deformity of L4 with less than 50% loss of height. Ventral epidural
disease is present at this level with resulting severe canal
stenosis and crowding of cauda equina.

Conus medullaris and cauda equina: Conus extends to the L1 level.

Paraspinal and other soft tissues: Unremarkable

Disc levels:

L1-L2:  Disc bulge.  No significant canal or foraminal stenosis.

L2-L3: Disc bulge with endplate osteophytic ridging and facet
arthropathy with ligamentum flavum infolding. Moderate canal
stenosis. Mild foraminal stenosis.

L3-L4: Disc bulge with endplate osteophytic ridging and marked facet
arthropathy with ligamentum flavum infolding. Marked canal stenosis
with effacement of the lateral recesses. Moderate foraminal
stenosis.

L4-L5: Disc bulge with endplate osteophytic ridging and moderate
right and marked left facet arthropathy with ligamentum flavum
infolding. Moderate to marked canal stenosis. Moderate to marked
foraminal stenosis.

L5-S1: Disc bulge with endplate osteophytic ridging and moderate
facet arthropathy. No canal stenosis. Moderate to marked foraminal
stenosis.
IMPRESSION: Motion degraded study with suboptimal evaluation.

Diffuse osseous metastatic disease. Mild to moderate compression
deformity of T3 with mild ventral epidural extension. No cord
compression. Mild compression deformity of L4 with ventral epidural
extension resulting in severe canal stenosis and crowding of cauda
equina.

Multilevel degenerative changes, greatest at lumbar levels with
significant canal stenosis at L3-L4 and L4-L5.

These results were called by telephone at the time of interpretation
on [DATE] at [DATE] to provider Dr. AYAVOO, who verbally
acknowledged these results.

## 2019-06-27 IMAGING — MR MR CERVICAL SPINE WO/W CM
4 of 10 series · 18 of 48 positions shown · IV contrast (gadavist)
Comparison: None.

CLINICAL DATA: Back pain, history of prostate cancer, new suspected
lung cancer

EXAM:
MRI CERVICAL, THORACIC AND LUMBAR SPINE WITHOUT AND WITH CONTRAST
CONTRAST:  7 mL Gadavist
TECHNIQUE: Multiplanar and multiecho pulse sequences of the cervical spine, to
include the craniocervical junction and cervicothoracic junction,
and thoracic and lumbar spine, were obtained without intravenous
contrast.

[Series 2: T2 · sagittal · 3.0mm · 0.38mm/px · 2 of 15 slices shown (1 of 2)]
[im 1/15]
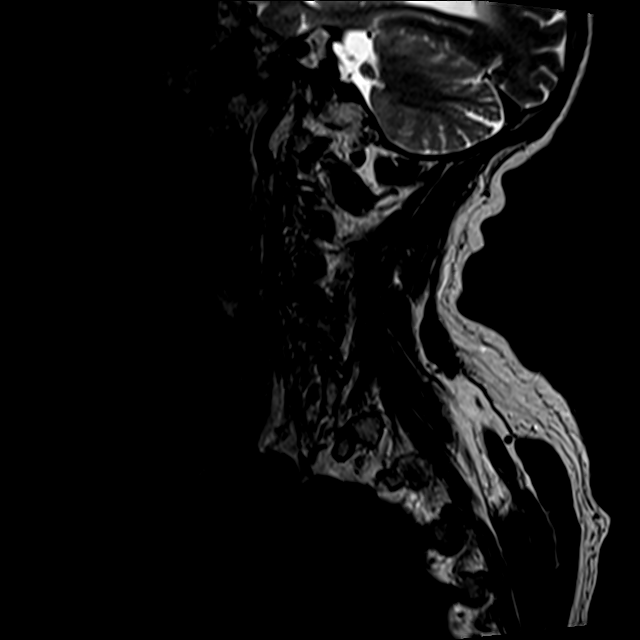
[im 15/15]
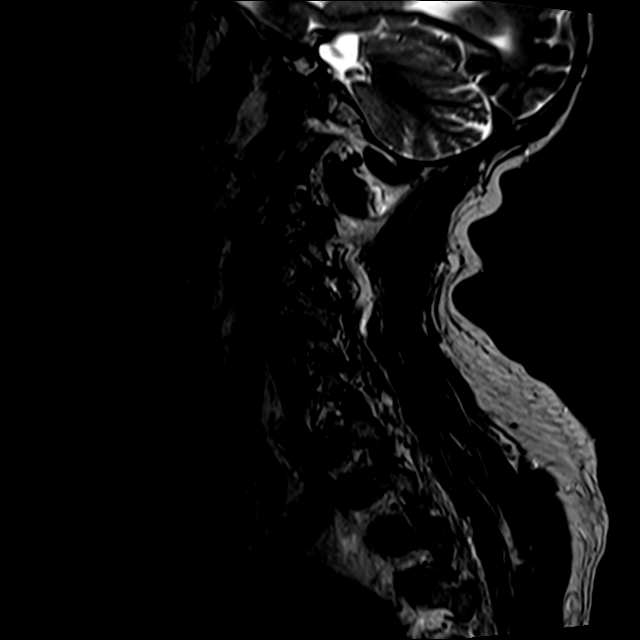

[Series 6: T2 · axial · 3.0mm · 0.43mm/px · z∈[-27,+96]mm · 8 of 36 slices shown (2 of 2)]
[im 1/36]
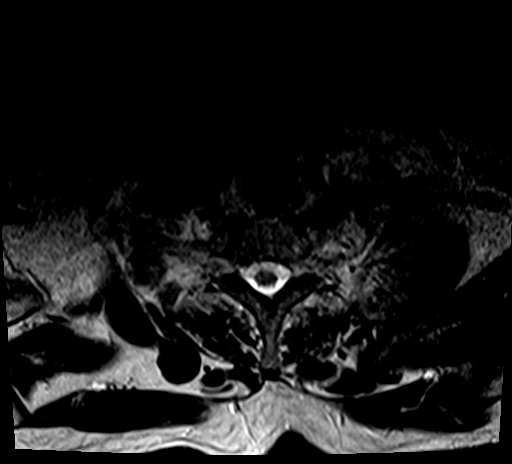
[im 6/36]
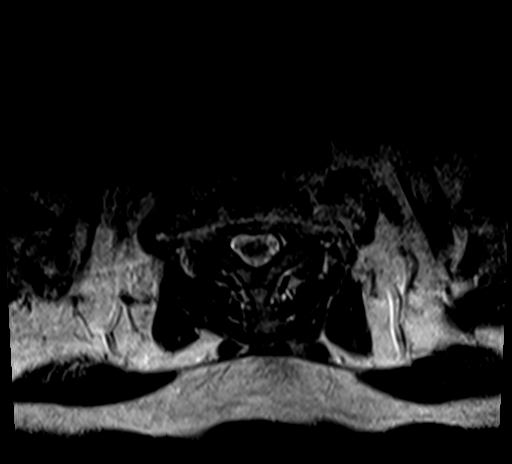
[im 11/36]
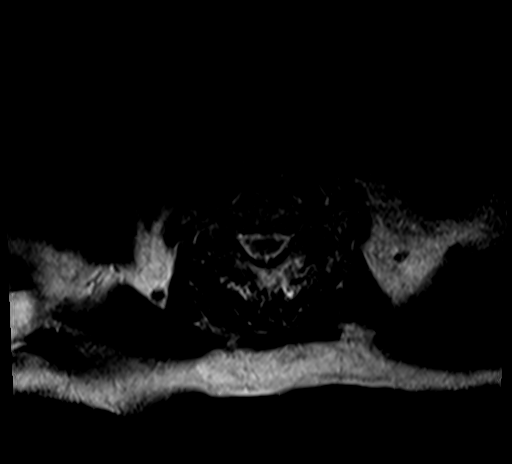
[im 16/36]
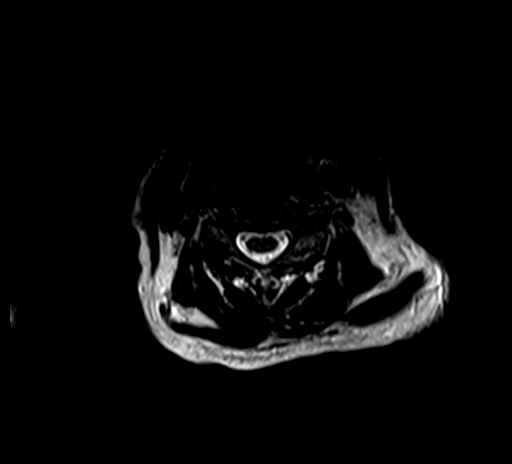
[im 21/36]
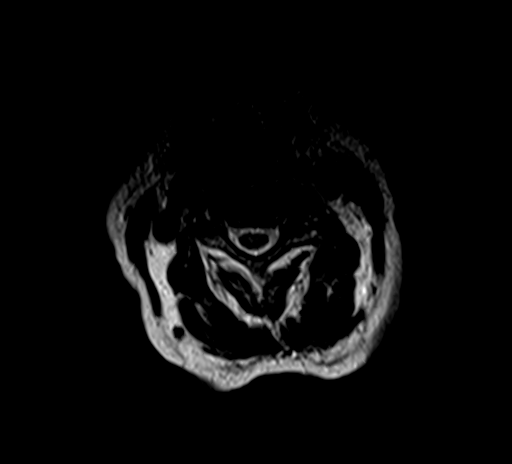
[im 26/36]
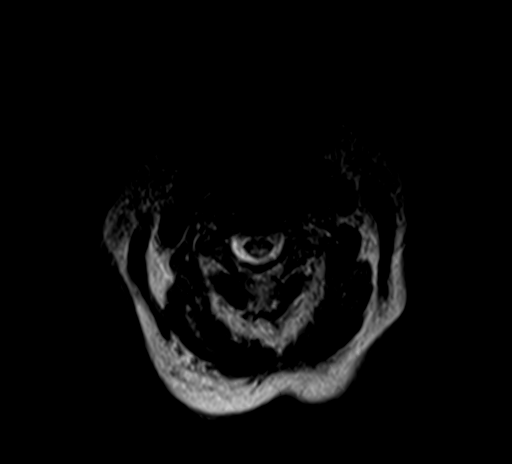
[im 31/36]
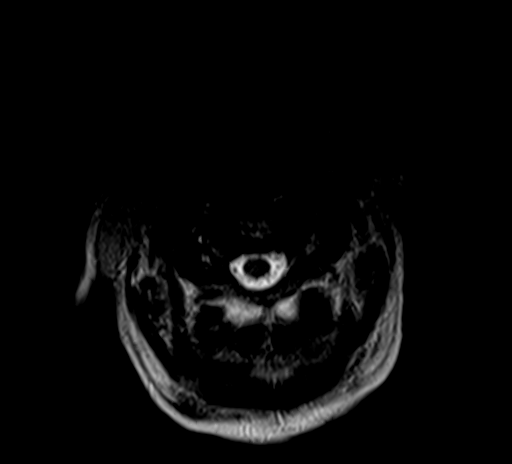
[im 36/36]
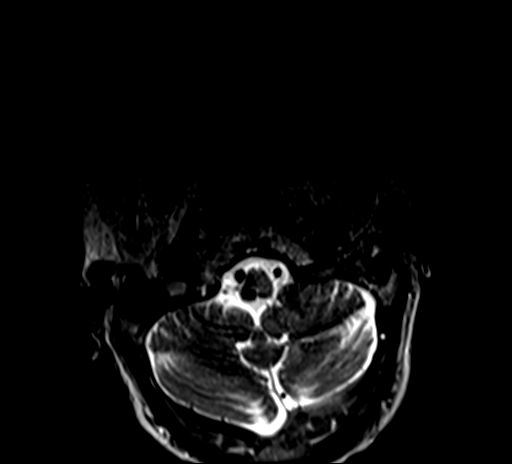

[Series 7: T1 · axial · 3.0mm · 0.43mm/px · z∈[-27,+79]mm · 5 of 36 slices shown (1 of 2)]
[im 1/36]
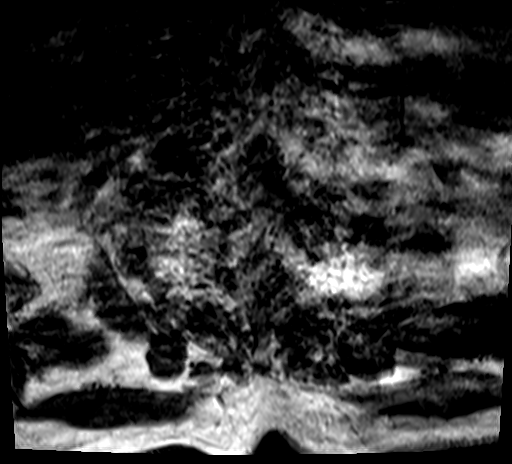
[im 6/36]
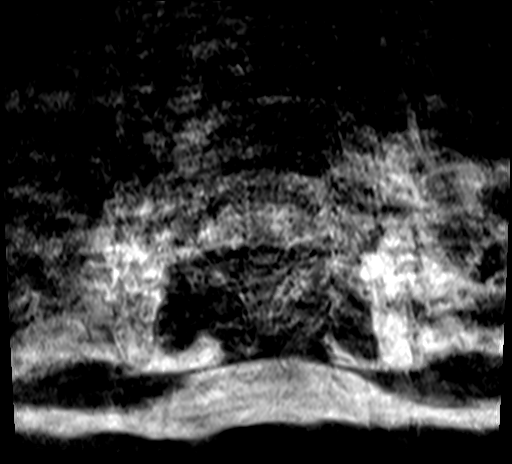
[im 11/36]
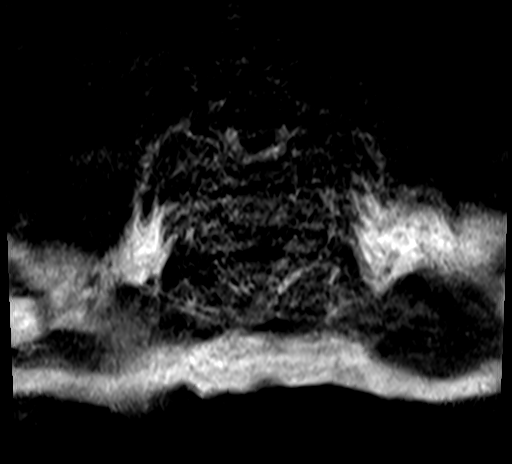
[im 21/36]
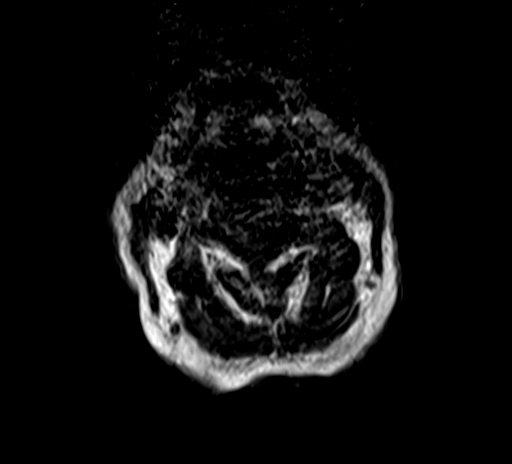
[im 31/36]
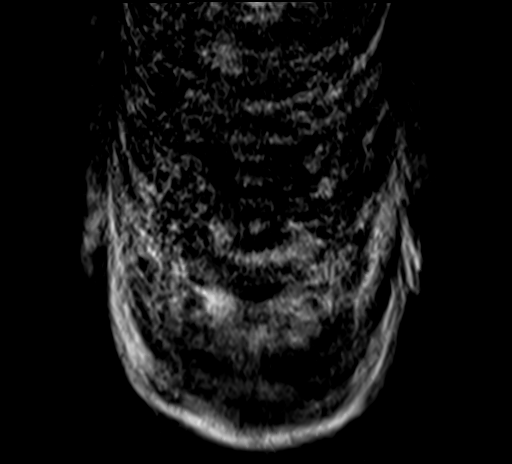

[Series 8: T1 · axial · 4.0mm · 0.43mm/px · z∈[-12,+77]mm · 3 of 19 slices shown (2 of 2)]
[im 1/19]
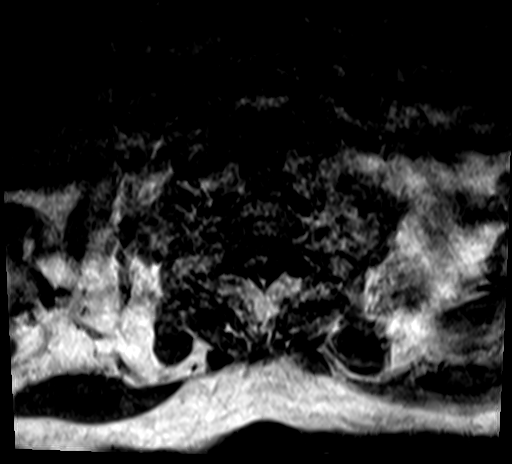
[im 13/19]
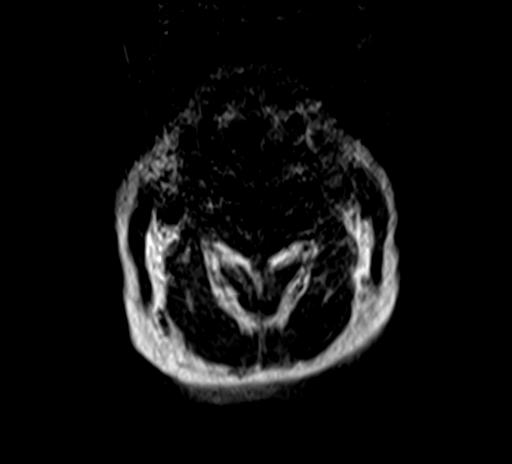
[im 19/19]
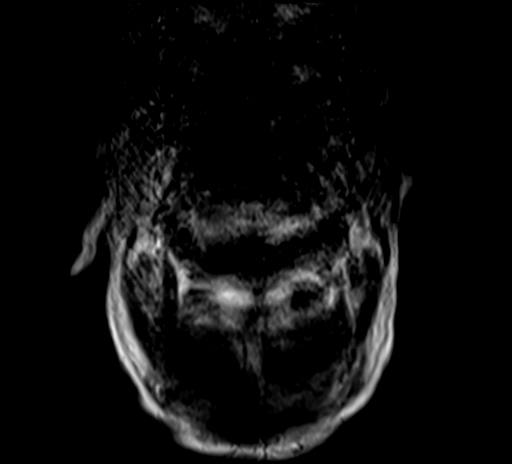

[18 of 48 positions shown; findings below may reference images not displayed]

FINDINGS: MRI CERVICAL SPINE

Motion artifact is present

Alignment: Anteroposterior alignment is maintained.

Vertebrae: There is abnormal marrow signal at multiple levels with
greatest involvement of C3 and C4 vertebral bodies.

There no compression deformity. No significant epidural disease
noted.

Cord: No definite abnormal signal within the above limitation.

Posterior Fossa, vertebral arteries, paraspinal tissues:
Unremarkable.

Disc levels: Multilevel disc bulges, endplate osteophytes, and facet
and uncovertebral hypertrophy. There is no high-grade canal
stenosis. Multilevel foraminal stenosis is present and poorly
evaluated due to artifact.

MRI THORACIC SPINE

Motion artifact is present

Alignment:  Anteroposterior alignment is maintained.

Vertebrae: Multifocal abnormal marrow signal with greatest
involvement of T2-T4 and T11 and T12. There is mild to moderate
compression deformity of T3 with less than 50% loss of height.
Probable mild ventral epidural extension at this level. There is
some foraminal extension at T2-T3, T3-T4, and T4-T5 levels.

Cord:  No abnormal signal within the above limitation.

Paraspinal and other soft tissues: Intrathoracic findings are better
evaluated on the prior chest CT.

Disc levels: Mild degenerative changes are present without
significant degenerative stenosis.

MRI LUMBAR SPINE

Motion artifact is present.

Segmentation: Standard.

Alignment:  Mild degenerative listhesis.

Vertebrae: There is multifocal abnormal marrow signal throughout the
lumbosacral spine and imaged bony pelvis. There is mild compression
deformity of L4 with less than 50% loss of height. Ventral epidural
disease is present at this level with resulting severe canal
stenosis and crowding of cauda equina.

Conus medullaris and cauda equina: Conus extends to the L1 level.

Paraspinal and other soft tissues: Unremarkable

Disc levels:

L1-L2:  Disc bulge.  No significant canal or foraminal stenosis.

L2-L3: Disc bulge with endplate osteophytic ridging and facet
arthropathy with ligamentum flavum infolding. Moderate canal
stenosis. Mild foraminal stenosis.

L3-L4: Disc bulge with endplate osteophytic ridging and marked facet
arthropathy with ligamentum flavum infolding. Marked canal stenosis
with effacement of the lateral recesses. Moderate foraminal
stenosis.

L4-L5: Disc bulge with endplate osteophytic ridging and moderate
right and marked left facet arthropathy with ligamentum flavum
infolding. Moderate to marked canal stenosis. Moderate to marked
foraminal stenosis.

L5-S1: Disc bulge with endplate osteophytic ridging and moderate
facet arthropathy. No canal stenosis. Moderate to marked foraminal
stenosis.
IMPRESSION: Motion degraded study with suboptimal evaluation.

Diffuse osseous metastatic disease. Mild to moderate compression
deformity of T3 with mild ventral epidural extension. No cord
compression. Mild compression deformity of L4 with ventral epidural
extension resulting in severe canal stenosis and crowding of cauda
equina.

Multilevel degenerative changes, greatest at lumbar levels with
significant canal stenosis at L3-L4 and L4-L5.

These results were called by telephone at the time of interpretation
on [DATE] at [DATE] to provider Dr. AYAVOO, who verbally
acknowledged these results.

## 2019-06-27 IMAGING — MR MR THORACIC SPINE WO/W CM
4 of 13 series · 14 of 48 positions shown · IV contrast (gadavist)
Comparison: None.

CLINICAL DATA: Back pain, history of prostate cancer, new suspected
lung cancer

EXAM:
MRI CERVICAL, THORACIC AND LUMBAR SPINE WITHOUT AND WITH CONTRAST
CONTRAST:  7 mL Gadavist
TECHNIQUE: Multiplanar and multiecho pulse sequences of the cervical spine, to
include the craniocervical junction and cervicothoracic junction,
and thoracic and lumbar spine, were obtained without intravenous
contrast.

[Series 14: T2 · sagittal · 3.0mm · 0.59mm/px · 4 of 15 slices shown (1 of 4)]
[im 1/15]
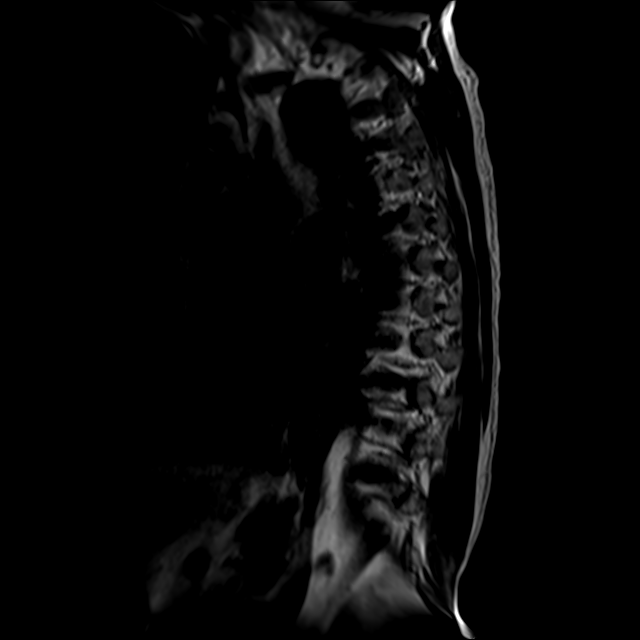
[im 5/15]
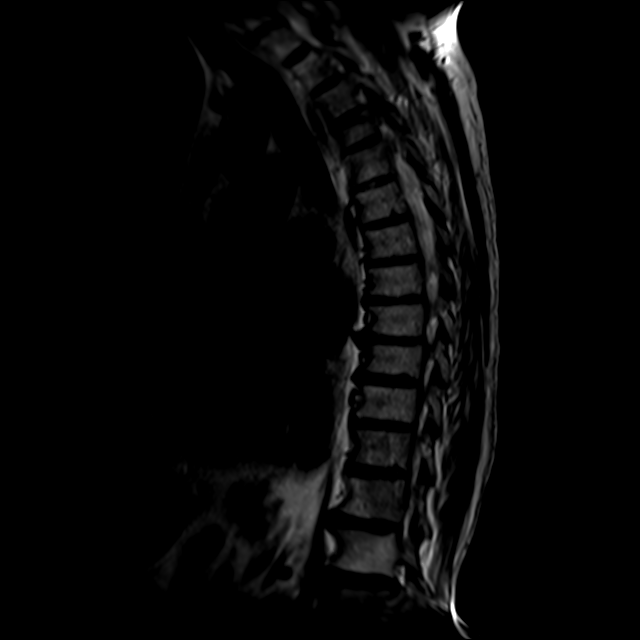
[im 10/15]
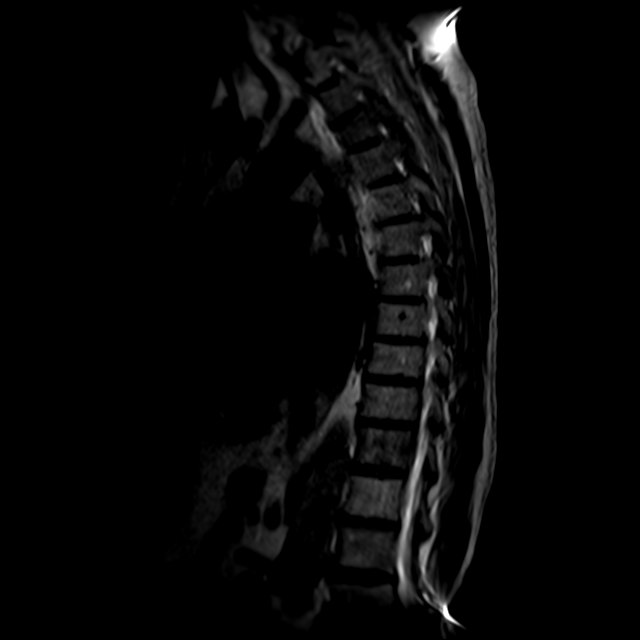
[im 15/15]
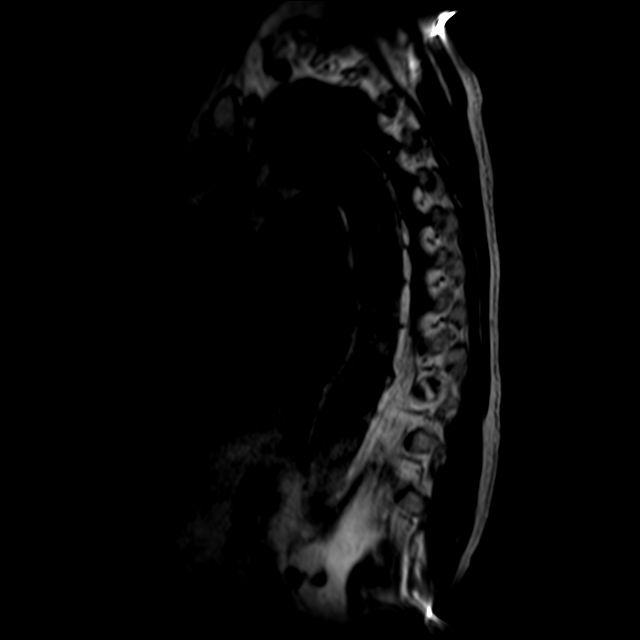

[Series 17: T2 · sagittal · 4.0mm · 0.59mm/px · 3 of 14 slices shown (2 of 4)]
[im 1/14]
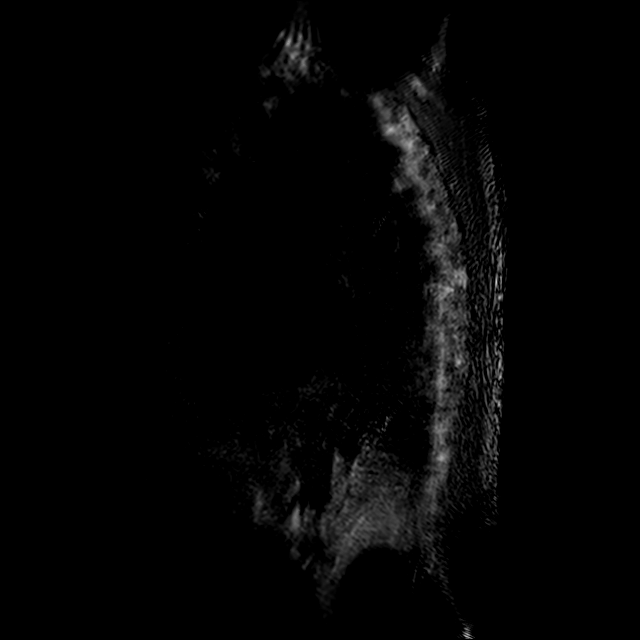
[im 7/14]
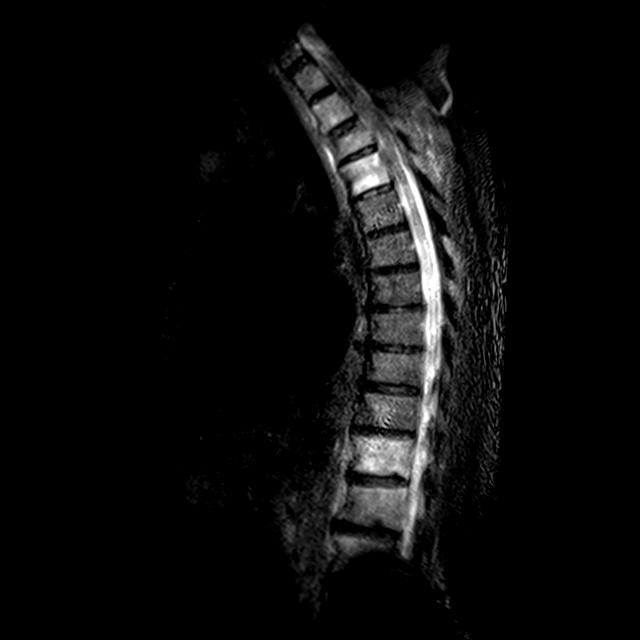
[im 14/14]
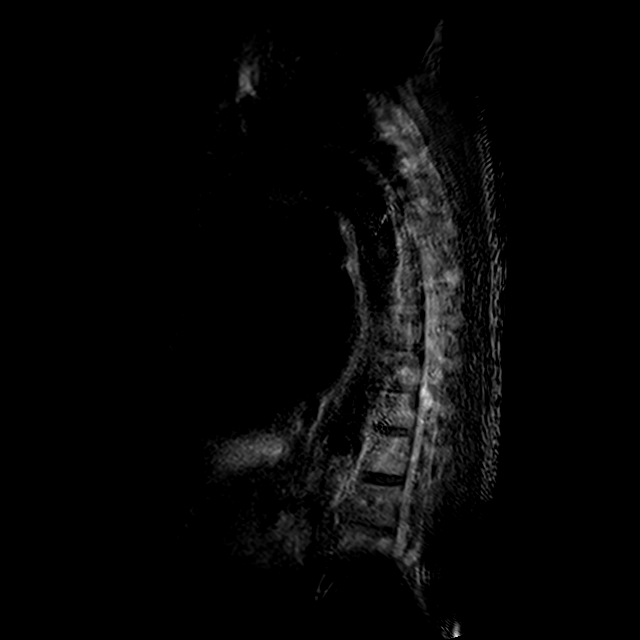

[Series 18: T2 · axial · 5.0mm · 0.47mm/px · z∈[-121,-45]mm · 4 of 16 slices shown (3 of 4)]
[im 1/16]
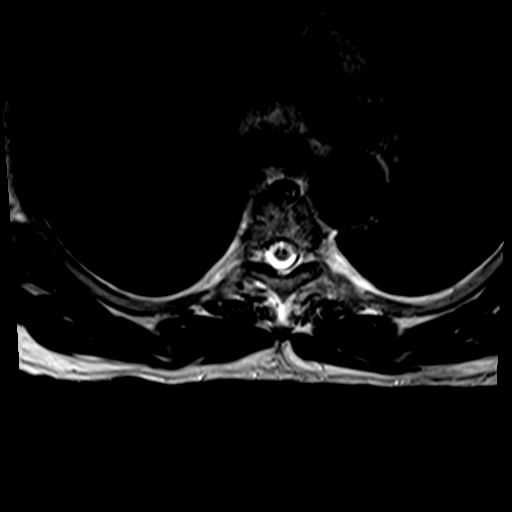
[im 6/16]
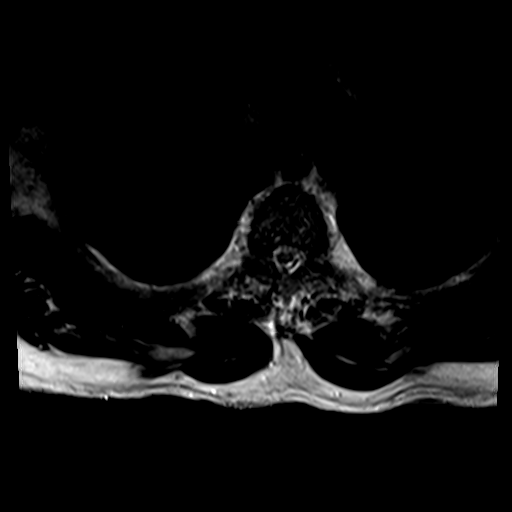
[im 11/16]
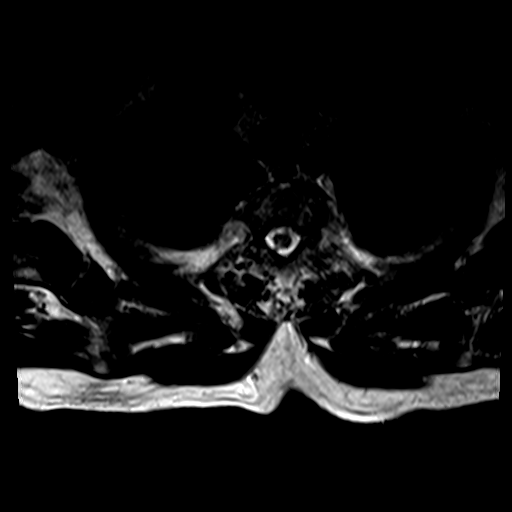
[im 16/16]
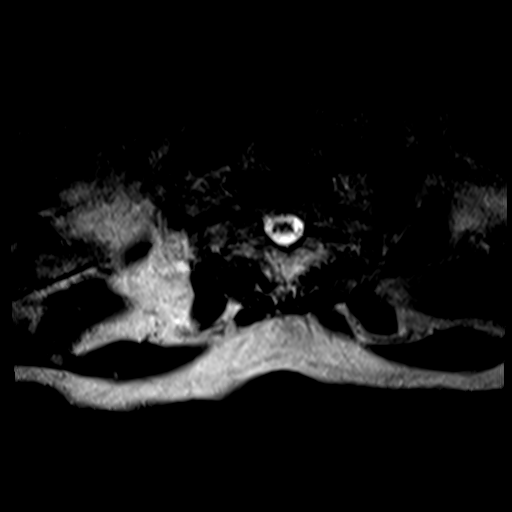

[Series 19: T2 · axial · 5.0mm · 0.47mm/px · z∈[-288,-184]mm · 3 of 20 slices shown (4 of 4)]
[im 1/20]
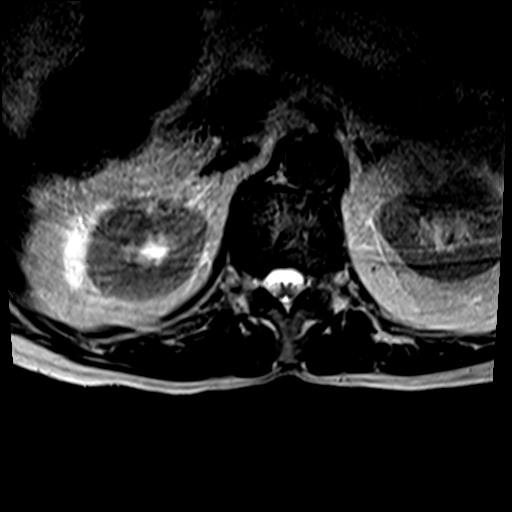
[im 13/20]
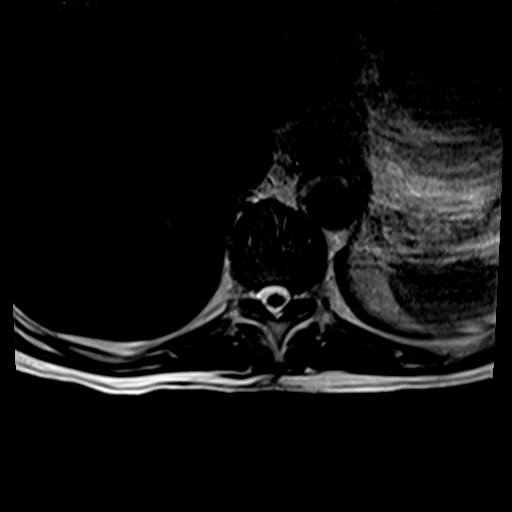
[im 20/20]
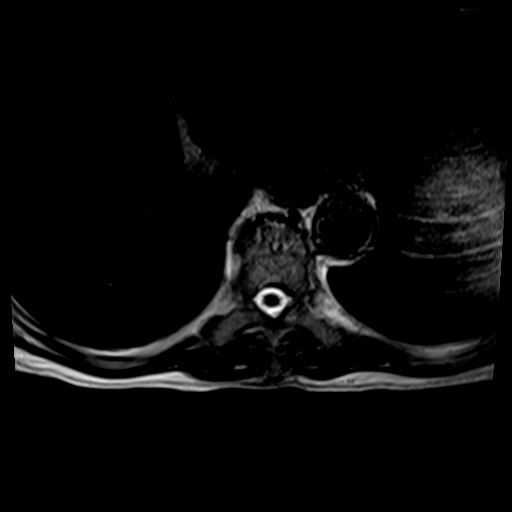

[14 of 48 positions shown; findings below may reference images not displayed]

FINDINGS: MRI CERVICAL SPINE

Motion artifact is present

Alignment: Anteroposterior alignment is maintained.

Vertebrae: There is abnormal marrow signal at multiple levels with
greatest involvement of C3 and C4 vertebral bodies.

There no compression deformity. No significant epidural disease
noted.

Cord: No definite abnormal signal within the above limitation.

Posterior Fossa, vertebral arteries, paraspinal tissues:
Unremarkable.

Disc levels: Multilevel disc bulges, endplate osteophytes, and facet
and uncovertebral hypertrophy. There is no high-grade canal
stenosis. Multilevel foraminal stenosis is present and poorly
evaluated due to artifact.

MRI THORACIC SPINE

Motion artifact is present

Alignment:  Anteroposterior alignment is maintained.

Vertebrae: Multifocal abnormal marrow signal with greatest
involvement of T2-T4 and T11 and T12. There is mild to moderate
compression deformity of T3 with less than 50% loss of height.
Probable mild ventral epidural extension at this level. There is
some foraminal extension at T2-T3, T3-T4, and T4-T5 levels.

Cord:  No abnormal signal within the above limitation.

Paraspinal and other soft tissues: Intrathoracic findings are better
evaluated on the prior chest CT.

Disc levels: Mild degenerative changes are present without
significant degenerative stenosis.

MRI LUMBAR SPINE

Motion artifact is present.

Segmentation: Standard.

Alignment:  Mild degenerative listhesis.

Vertebrae: There is multifocal abnormal marrow signal throughout the
lumbosacral spine and imaged bony pelvis. There is mild compression
deformity of L4 with less than 50% loss of height. Ventral epidural
disease is present at this level with resulting severe canal
stenosis and crowding of cauda equina.

Conus medullaris and cauda equina: Conus extends to the L1 level.

Paraspinal and other soft tissues: Unremarkable

Disc levels:

L1-L2:  Disc bulge.  No significant canal or foraminal stenosis.

L2-L3: Disc bulge with endplate osteophytic ridging and facet
arthropathy with ligamentum flavum infolding. Moderate canal
stenosis. Mild foraminal stenosis.

L3-L4: Disc bulge with endplate osteophytic ridging and marked facet
arthropathy with ligamentum flavum infolding. Marked canal stenosis
with effacement of the lateral recesses. Moderate foraminal
stenosis.

L4-L5: Disc bulge with endplate osteophytic ridging and moderate
right and marked left facet arthropathy with ligamentum flavum
infolding. Moderate to marked canal stenosis. Moderate to marked
foraminal stenosis.

L5-S1: Disc bulge with endplate osteophytic ridging and moderate
facet arthropathy. No canal stenosis. Moderate to marked foraminal
stenosis.
IMPRESSION: Motion degraded study with suboptimal evaluation.

Diffuse osseous metastatic disease. Mild to moderate compression
deformity of T3 with mild ventral epidural extension. No cord
compression. Mild compression deformity of L4 with ventral epidural
extension resulting in severe canal stenosis and crowding of cauda
equina.

Multilevel degenerative changes, greatest at lumbar levels with
significant canal stenosis at L3-L4 and L4-L5.

These results were called by telephone at the time of interpretation
on [DATE] at [DATE] to provider Dr. AYAVOO, who verbally
acknowledged these results.

## 2019-06-27 IMAGING — CT CT ANGIO CHEST-ABD-PELV FOR DISSECTION W/ AND WO/W CM
2 of 7 series · 11 of 46 positions shown, 12 images · non-contrast
Comparison: CT the abdomen and pelvis [DATE].

CLINICAL DATA: 79-year-old male with history of severe lower
abdominal and back pain.

EXAM:
CT ANGIOGRAPHY CHEST, ABDOMEN AND PELVIS
TECHNIQUE: Non-contrast CT of the chest was initially obtained.

[Series 5: axial arterial · axial · arterial · 0.72mm/px · z∈[+752,+1289]mm · 8 of 209 slices shown, 9 images]
[im 15/209  soft-tissue]
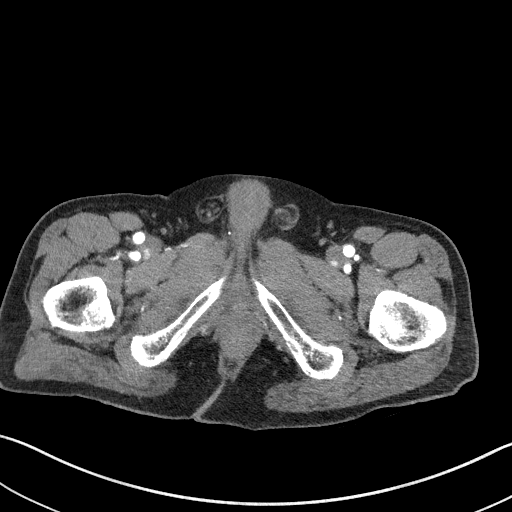
[im 15/209  bone]
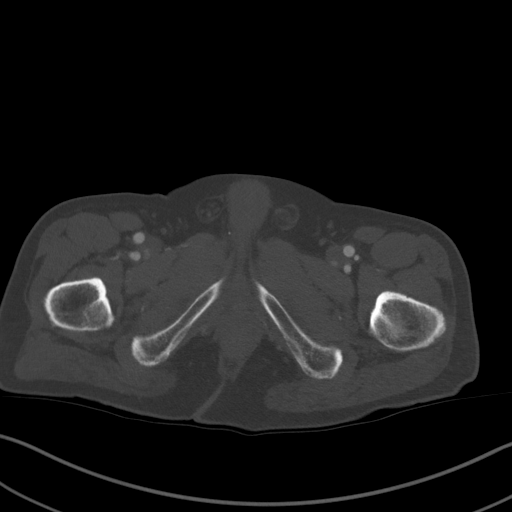
[im 45/209  soft-tissue]
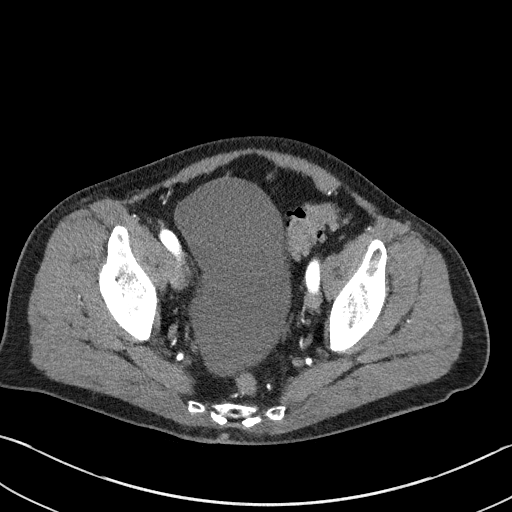
[im 75/209  soft-tissue]
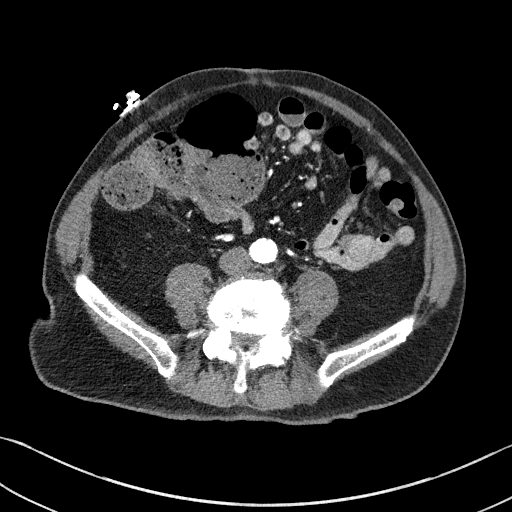
[im 90/209  soft-tissue]
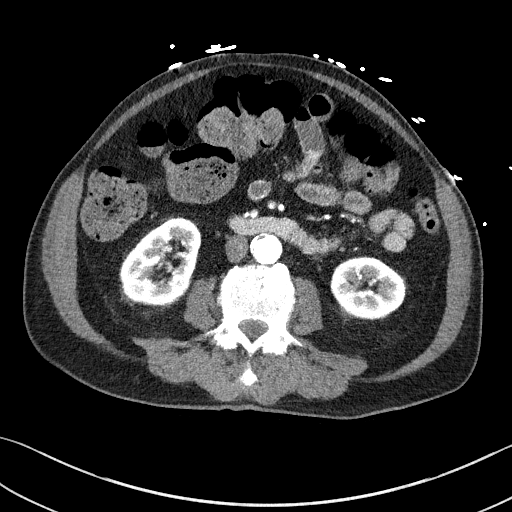
[im 119/209  soft-tissue]
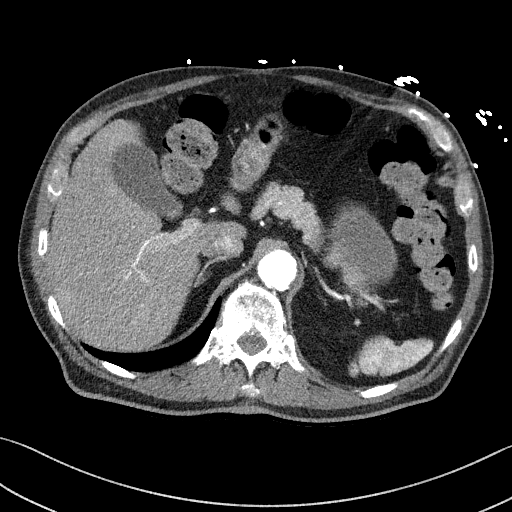
[im 134/209  soft-tissue]
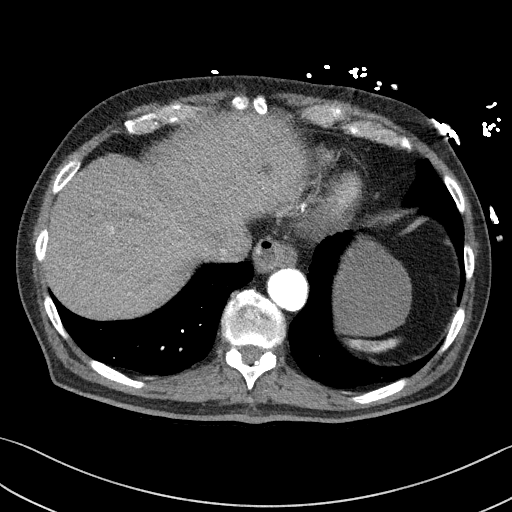
[im 164/209  soft-tissue]
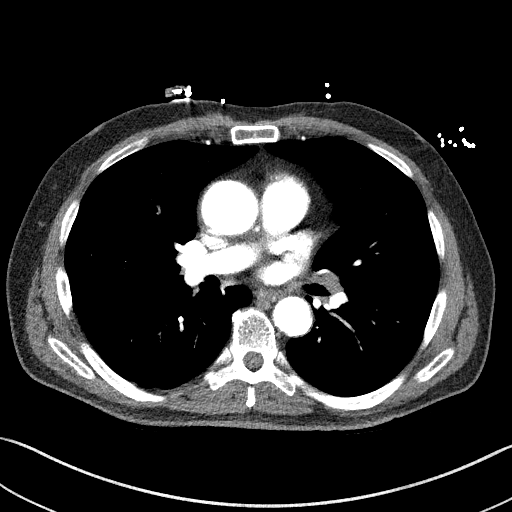
[im 194/209  soft-tissue]
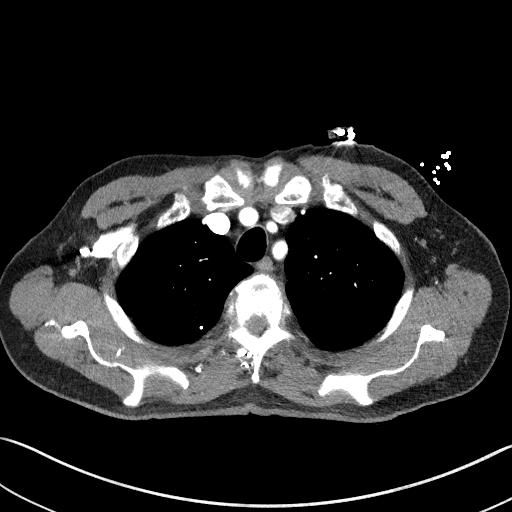

[Series 9: cor soft · coronal · 0.72mm/px · 3 of 150 slices shown]
[im 38/150  soft-tissue]
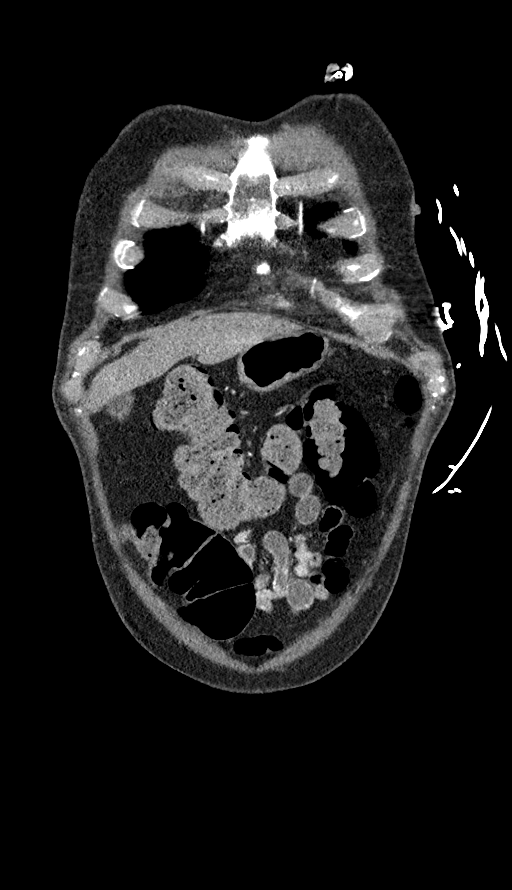
[im 75/150  soft-tissue]
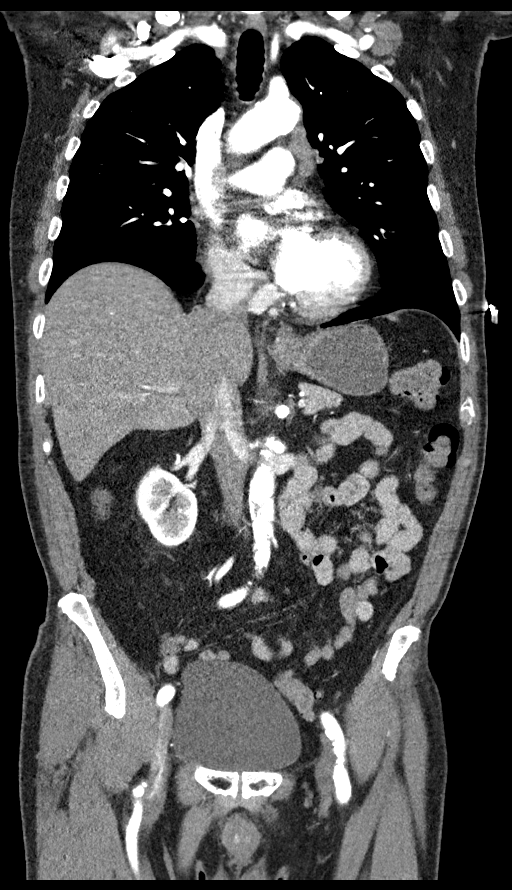
[im 112/150  soft-tissue]
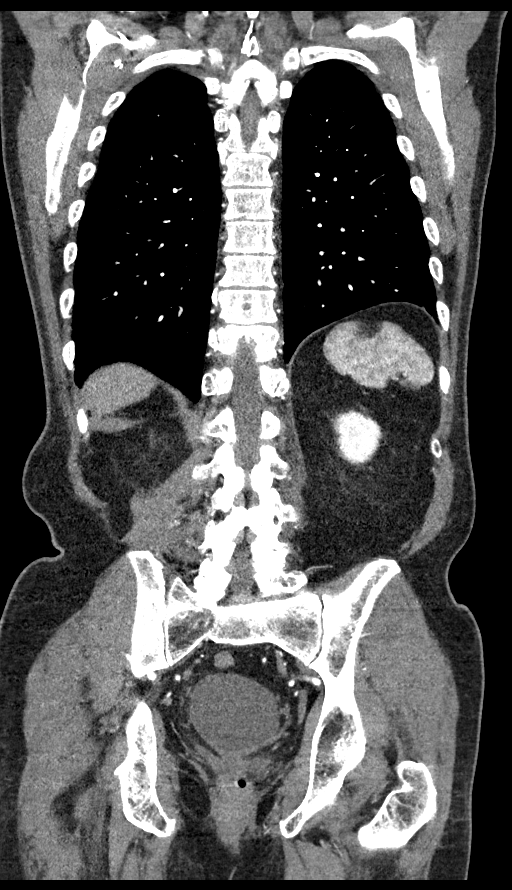

[11 of 46 positions shown; findings below may reference images not displayed]

Multidetector CT imaging through the chest, abdomen and pelvis was
performed using the standard protocol during bolus administration of
intravenous contrast. Multiplanar reconstructed images and MIPs were
obtained and reviewed to evaluate the vascular anatomy.

CONTRAST:  100mL OMNIPAQUE IOHEXOL 350 MG/ML SOLN
FINDINGS: CTA CHEST FINDINGS

Cardiovascular: Precontrast images demonstrate no crescentic high
attenuation associated with the wall of the thoracic aorta to
suggest acute intramural hematoma. No aneurysm or dissection of the
thoracic aorta. Ectasia of ascending thoracic aorta (4.0 cm in
diameter). Mid thoracic aortic arch measures 2.9 cm in diameter.
Descending thoracic aorta measures 2.8 cm in diameter. Heart size is
normal. There is no significant pericardial fluid, thickening or
pericardial calcification. There is aortic atherosclerosis, as well
as atherosclerosis of the great vessels of the mediastinum and the
coronary arteries, including calcified atherosclerotic plaque in the
left main, left anterior descending, left circumflex and right
coronary arteries.

Mediastinum/Nodes: Enlarged AP window lymph node measuring 1.7 cm in
short axis. Borderline enlarged left hilar lymph nodes measuring up
to 1.3 cm in short axis. Small hiatal hernia. No axillary
lymphadenopathy.

Lungs/Pleura: Multiple pulmonary nodules are noted, largest of which
is in the left upper lobe (axial image 63 of series 7 and sagittal
image 141 of series 10) measuring 2.5 x 1.6 x 1.5 cm. This lesion
has macrolobulated slightly ill-defined margins. Several other
smaller pulmonary nodules are noted including a 7 x 4 mm left upper
lobe nodule (axial image 65 of series 7), and a 1.0 x 0.5 cm nodule
in the superior segment of the right lower lobe (axial image 44 of
series 7). No acute consolidative airspace disease. No pleural
effusions.

Musculoskeletal: There are no aggressive appearing lytic or blastic
lesions noted in the visualized portions of the skeleton.

Review of the MIP images confirms the above findings.

CTA ABDOMEN AND PELVIS FINDINGS

VASCULAR

Aorta: Normal caliber aorta without aneurysm, dissection, vasculitis
or significant stenosis. Extensive atherosclerotic disease
throughout the abdominal aorta.

Celiac: Patent without evidence of dissection, vasculitis or
significant stenosis. Mild ectasia of the proximal celiac axis which
measures 8 mm in diameter.

SMA: Patent without evidence of aneurysm, dissection, vasculitis or
significant stenosis.

Renals: Both renal arteries are patent without evidence of aneurysm,
dissection, vasculitis, fibromuscular dysplasia or significant
stenosis.

IMA: Patent without evidence of aneurysm, dissection, vasculitis or
significant stenosis.

Inflow: Patent without evidence of aneurysm, dissection, vasculitis
or significant stenosis.

Veins: No obvious venous abnormality within the limitations of this
arterial phase study.

Review of the MIP images confirms the above findings.

NON-VASCULAR

Hepatobiliary: 1.2 cm low-attenuation lesion in segment 2 of the
liver, compatible with a simple cyst. No other larger more
suspicious appearing pulmonary nodules or masses are noted. No intra
or extrahepatic biliary ductal dilatation. Gallbladder is normal in
appearance.

Pancreas: No pancreatic mass. No pancreatic ductal dilatation. No
pancreatic or peripancreatic fluid collections or inflammatory
changes.

Spleen: Unremarkable.

Adrenals/Urinary Tract: Subcentimeter low-attenuation lesion in the
posterior aspect of the interpolar region of the left kidney, too
small to characterize, but statistically likely to represent a tiny
cyst. Right kidney and bilateral adrenal glands are normal in
appearance. No hydroureteronephrosis. Urinary bladder is normal in
appearance.

Stomach/Bowel: Normal appearance of the stomach. No pathologic
dilatation of small bowel or colon. Numerous colonic diverticulae
are noted, without surrounding inflammatory changes to suggest an
acute diverticulitis at this time. The appendix is not confidently
identified and may be surgically absent. Regardless, there are no
inflammatory changes noted adjacent to the cecum to suggest the
presence of an acute appendicitis at this time.

Lymphatic: No lymphadenopathy noted in the abdomen or pelvis.

Reproductive: Fiducial markers in the prostate gland.

Other: No significant volume of ascites.  No pneumoperitoneum.

Musculoskeletal: There are no aggressive appearing lytic or blastic
lesions noted in the visualized portions of the skeleton.

Review of the MIP images confirms the above findings.
IMPRESSION: 1. No acute findings are noted in the chest, abdomen or pelvis to
account for the patient's history of abdominal pain.
2. Multiple pulmonary nodules in the lungs bilaterally, largest of
which is a left upper lobe pulmonary nodule measuring 2.5 x 1.6 x
1.5 cm. This is associated with left hilar and AP window
lymphadenopathy. Findings are highly concerning for primary
bronchogenic carcinoma with metastatic disease to the lymph nodes
and lungs in the thorax. Further evaluation with PET-CT is
recommended in the near future. No definite signs of metastatic
disease in the abdomen or pelvis.
3. Colonic diverticulosis without evidence of acute diverticulitis
at this time.
4. Aortic atherosclerosis, in addition to left main and 3 vessel
coronary artery disease. Assessment for potential risk factor
modification, dietary therapy or pharmacologic therapy may be
warranted, if clinically indicated.
5. Ectasia of the ascending thoracic aorta (4.0 cm in diameter).
Recommend annual imaging followup by CTA or MRA. This recommendation
follows [3W] ACCF/AHA/AATS/ACR/ASA/SCA/AIDA/AIDA/AIDA/AIDA Guidelines
for the Diagnosis and Management of Patients with Thoracic Aortic
Disease. Circulation. [3W]; 121: [id number]. Aortic aneurysm NOS
([3W]-[3W]).
6. Additional incidental findings, as above.

## 2019-06-27 MED ORDER — IOHEXOL 350 MG/ML SOLN
100.0000 mL | Freq: Once | INTRAVENOUS | Status: AC | PRN
  Administered 2019-06-27: 100 mL via INTRAVENOUS

## 2019-06-27 MED ORDER — ALPRAZOLAM 0.5 MG PO TABS
0.5000 mg | ORAL_TABLET | Freq: Two times a day (BID) | ORAL | Status: DC | PRN
  Administered 2019-06-30 – 2019-07-05 (×5): 0.5 mg via ORAL
  Filled 2019-06-27 (×6): qty 1

## 2019-06-27 MED ORDER — DEXAMETHASONE SODIUM PHOSPHATE 4 MG/ML IJ SOLN
4.0000 mg | Freq: Four times a day (QID) | INTRAMUSCULAR | Status: DC
  Administered 2019-06-28: 4 mg via INTRAVENOUS
  Filled 2019-06-27: qty 1

## 2019-06-27 MED ORDER — SODIUM CHLORIDE 0.9 % IV BOLUS
500.0000 mL | Freq: Once | INTRAVENOUS | Status: AC
  Administered 2019-06-27: 500 mL via INTRAVENOUS

## 2019-06-27 MED ORDER — MORPHINE SULFATE (PF) 4 MG/ML IV SOLN
6.0000 mg | Freq: Once | INTRAVENOUS | Status: AC
  Administered 2019-06-27: 6 mg via INTRAVENOUS
  Filled 2019-06-27: qty 2

## 2019-06-27 MED ORDER — HEPARIN SODIUM (PORCINE) 5000 UNIT/ML IJ SOLN
5000.0000 [IU] | Freq: Three times a day (TID) | INTRAMUSCULAR | Status: DC
  Administered 2019-06-27 – 2019-07-06 (×25): 5000 [IU] via SUBCUTANEOUS
  Filled 2019-06-27 (×24): qty 1

## 2019-06-27 MED ORDER — HYDROCODONE-ACETAMINOPHEN 10-325 MG PO TABS
1.0000 | ORAL_TABLET | Freq: Four times a day (QID) | ORAL | Status: DC | PRN
  Filled 2019-06-27: qty 1

## 2019-06-27 MED ORDER — TAMSULOSIN HCL 0.4 MG PO CAPS
0.4000 mg | ORAL_CAPSULE | Freq: Every day | ORAL | Status: DC
  Administered 2019-06-27 – 2019-07-05 (×7): 0.4 mg via ORAL
  Filled 2019-06-27 (×7): qty 1

## 2019-06-27 MED ORDER — ROSUVASTATIN CALCIUM 20 MG PO TABS
40.0000 mg | ORAL_TABLET | Freq: Every day | ORAL | Status: DC
  Administered 2019-06-27 – 2019-07-06 (×10): 40 mg via ORAL
  Filled 2019-06-27 (×13): qty 2

## 2019-06-27 MED ORDER — ASPIRIN EC 81 MG PO TBEC
81.0000 mg | DELAYED_RELEASE_TABLET | Freq: Every day | ORAL | Status: DC
  Administered 2019-06-28 – 2019-07-06 (×9): 81 mg via ORAL
  Filled 2019-06-27 (×9): qty 1

## 2019-06-27 MED ORDER — MORPHINE SULFATE (PF) 4 MG/ML IV SOLN
4.0000 mg | Freq: Once | INTRAVENOUS | Status: AC
  Administered 2019-06-27: 4 mg via INTRAVENOUS
  Filled 2019-06-27: qty 1

## 2019-06-27 MED ORDER — FENTANYL CITRATE (PF) 100 MCG/2ML IJ SOLN
50.0000 ug | INTRAMUSCULAR | Status: DC | PRN
  Administered 2019-06-27 – 2019-06-28 (×5): 50 ug via INTRAVENOUS
  Filled 2019-06-27 (×5): qty 2

## 2019-06-27 MED ORDER — DEXAMETHASONE SODIUM PHOSPHATE 10 MG/ML IJ SOLN
10.0000 mg | Freq: Once | INTRAMUSCULAR | Status: AC
  Administered 2019-06-27: 10 mg via INTRAVENOUS
  Filled 2019-06-27: qty 1

## 2019-06-27 MED ORDER — HYDROMORPHONE HCL 1 MG/ML IJ SOLN
INTRAMUSCULAR | Status: AC
  Filled 2019-06-27: qty 1

## 2019-06-27 MED ORDER — GADOBUTROL 1 MMOL/ML IV SOLN: 7.0000 mL | Freq: Once | INTRAVENOUS | Status: DC | PRN

## 2019-06-27 MED ORDER — GADOBUTROL 1 MMOL/ML IV SOLN
7.0000 mL | Freq: Once | INTRAVENOUS | Status: AC | PRN
  Administered 2019-06-27: 7 mL via INTRAVENOUS

## 2019-06-27 MED ORDER — HYDROMORPHONE HCL 1 MG/ML IJ SOLN
1.0000 mg | Freq: Once | INTRAMUSCULAR | Status: AC
  Administered 2019-06-27: 1 mg via INTRAVENOUS
  Filled 2019-06-27: qty 1

## 2019-06-27 MED ORDER — LEVOTHYROXINE SODIUM 50 MCG PO TABS
75.0000 ug | ORAL_TABLET | Freq: Every day | ORAL | Status: DC
  Administered 2019-06-28 – 2019-07-06 (×9): 75 ug via ORAL
  Filled 2019-06-27 (×9): qty 1

## 2019-06-27 MED ORDER — PANTOPRAZOLE SODIUM 40 MG PO TBEC
40.0000 mg | DELAYED_RELEASE_TABLET | Freq: Every day | ORAL | Status: DC
  Administered 2019-06-28 – 2019-07-06 (×9): 40 mg via ORAL
  Filled 2019-06-27 (×9): qty 1

## 2019-06-27 NOTE — ED Notes (Signed)
Placed pt. On 2L of O2. Pts. O2 was in the 80's.

## 2019-06-27 NOTE — Discharge Instructions (Signed)
You need PET scan and repeat CTA of thoracic aorta in the future.

## 2019-06-27 NOTE — ED Triage Notes (Signed)
Low back pain and prostate pain since yesterday. Pt did yard work yesterday.  Took Norco this morning with no relief.

## 2019-06-27 NOTE — ED Provider Notes (Signed)
[facility] EMERGENCY DEPARTMENT Provider Note   CSN: [id number] Arrival date & time: 06/27/19  1022     History Chief Complaint  Patient presents with  . Back Pain    Keith Steen Bisig. is a 79 y.o. male.  Patient with history of prostate cancer, multiple radiation doses in the past, no current treatment, coronary artery disease presents with worsening pain for the past 3 days.  Gradually worsening now severe in the lower back mild radiation down the legs.  No history of aortic pathology known.  Patient feels like his get a pass out.  Mild lower abdominal pelvic pain with it.  Patient does not know if he has spread of his cancer.  No fever chills or cough.  No shortness of breath.  No chest pain.  Pain constant also worse with movement.        Past Medical History:  Diagnosis Date  . Agent orange exposure   . Arthritis   . Coronary artery disease    BMS to mid LAD 2001, DES to proximal LAD 2017  . Enlarged prostate    XRT 2016  . Headache    Sinus headaches   . History of depression   . Hyperlipidemia   . Hypothyroidism   . Prostate cancer ([place]) 07/2014   hormonal therapy, external beam radiation therapy  . PTSD (post-traumatic stress disorder)     Patient Active Problem List   Diagnosis Date Noted  . Unstable angina ([place])   . History of depression 09/05/2017  . Hypothyroidism 09/05/2017  . Depression, major, single episode, moderate ([place]) 11/26/2016  . Prostatitis 11/26/2016  . Rectal bleeding 02/25/2016  . Pain management contract signed 01/27/2016  . Constipation 04/22/2015  . Lower abdominal pain 04/22/2015  . Anxiety state 12/16/2014  . Malignant neoplasm of prostate ([place]) 09/19/2014  . 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 Hyperlipidemia 05/22/2012  . BPH (benign prostatic hyperplasia) 05/22/2012  . Chest pain 02/24/2012  . CAD (coronary artery disease) 02/24/2012    Past Surgical History:  Procedure Laterality Date  . CARDIAC CATHETERIZATION  N/A 12/12/2015   Procedure: Left Heart Cath and Coronary Angiography;  Surgeon: Peter M Martinique, MD;  Location: [place] [facility];  Service: Cardiovascular;  Laterality: N/A;  . CARDIAC CATHETERIZATION N/A 12/12/2015   Procedure: Coronary Stent Intervention;  Surgeon: Peter M Martinique, MD;  Location: [place] [facility];  Service: Cardiovascular;  Laterality: N/A;  . CORONARY STENT INTERVENTION N/A 09/06/2017   Procedure: CORONARY STENT INTERVENTION;  Surgeon: Burnell Blanks, MD;  Location: [place] [facility];  Service: Cardiovascular;  Laterality: N/A;  . HERNIA REPAIR Right   . LEFT HEART CATH AND CORONARY ANGIOGRAPHY N/A 09/02/2016   Procedure: Left Heart Cath and Coronary Angiography;  Surgeon: Leonie Man, MD;  Location: [place] [facility];  Service: Cardiovascular;  Laterality: N/A;  . LEFT HEART CATH AND CORONARY ANGIOGRAPHY N/A 09/06/2017   Procedure: LEFT HEART CATH AND CORONARY ANGIOGRAPHY;  Surgeon: Burnell Blanks, MD;  Location: [place] [facility];  Service: Cardiovascular;  Laterality: N/A;  . LEFT HEART CATH AND CORONARY ANGIOGRAPHY N/A 05/04/2019   Procedure: LEFT HEART CATH AND CORONARY ANGIOGRAPHY;  Surgeon: Martinique, Peter M, MD;  Location: [place] [facility];  Service: Cardiovascular;  Laterality: N/A;  . PROSTATE BIOPSY N/A 08/28/2014   Procedure: BIOPSY TRANSRECTAL ULTRASONIC PROSTATE (TUBP);  Surgeon: Rana Snare, MD;  Location: [facility];  Service: Urology;  Laterality: N/A;  . TRANSURETHRAL RESECTION OF PROSTATE  N/A 08/28/2014   Procedure: TRANSURETHRAL RESECTION OF THE PROSTATE WITH GYRUS INSTRUMENTS;  Surgeon: Rana Snare, MD;  Location: [facility];  Service: Urology;  Laterality: N/A;       Family History  Problem Relation Age of Onset  . Arthritis Mother   . Heart attack Father     Social History   Tobacco Use  . Smoking status: Never Smoker  . Smokeless tobacco: Former Systems developer    Types: Chew  Substance Use Topics  . Alcohol use: No  . Drug use: No     Home Medications Prior to Admission medications   Medication Sig Start Date End Date Taking? Authorizing Provider  ALPRAZolam Duanne Moron) 0.5 MG tablet Take 0.5 mg by mouth 2 (two) times daily as needed. 04/05/19   [provider]  aspirin EC 81 MG tablet Take 1 tablet (81 mg total) by mouth daily. 12/09/15   Satira Sark, MD  celecoxib (CELEBREX) 100 MG capsule Take 1 capsule by mouth 2 (two) times daily as needed. 01/24/19   [provider]  diclofenac sodium (VOLTAREN) 1 % GEL APPLY 2 GRAMS TOPICALLY 4 TIMES DAILY Patient taking differently: Apply 2 g topically 4 (four) times daily as needed.  12/21/18   Dettinger, Fransisca Kaufmann, MD  levothyroxine (SYNTHROID, LEVOTHROID) 75 MCG tablet Take 1 tablet (75 mcg total) by mouth daily before breakfast. 09/16/17   Dettinger, Fransisca Kaufmann, MD  nitroGLYCERIN (NITROSTAT) 0.4 MG SL tablet Place 1 tablet (0.4 mg total) under the tongue every 5 (five) minutes as needed for chest pain. X 3 doses Patient taking differently: Place 0.4 mg under the tongue every 5 (five) minutes as needed for chest pain.  09/07/17   Bhagat, Crista Luria, PA  rosuvastatin (CRESTOR) 40 MG tablet Take 1 tablet (40 mg total) by mouth daily. 09/07/17   Bhagat, Crista Luria, PA  SUMAtriptan (IMITREX) 25 MG tablet TAKE 1 TABLET BY MOUTH EVERY 2 HOURS AS NEEDED FOR MIGRAINE. MAY REPEAT IN 2 HOURS IF HEADACHE PERSISTS OR RECURS Patient taking differently: Take 25 mg by mouth every 2 (two) hours as needed for migraine or headache. TAKE 1 TABLET BY MOUTH EVERY 2 HOURS AS NEEDED FOR MIGRAINE. MAY REPEAT IN 2 HOURS IF HEADACHE PERSISTS OR RECURS 12/08/18   Dettinger, Fransisca Kaufmann, MD  tamsulosin (FLOMAX) 0.4 MG CAPS capsule Take 0.4 mg by mouth at bedtime. 05/23/18   [provider]    Allergies    Lipitor [atorvastatin]  Review of Systems   Review of Systems  Constitutional: Negative for chills and fever.  HENT: Negative for congestion.   Eyes: Negative for visual  disturbance.  Respiratory: Negative for shortness of breath.   Cardiovascular: Negative for chest pain.  Gastrointestinal: Positive for abdominal pain. Negative for vomiting.  Genitourinary: Negative for dysuria and flank pain.  Musculoskeletal: Positive for back pain. Negative for neck pain and neck stiffness.  Skin: Negative for rash.  Neurological: Positive for light-headedness. Negative for weakness, numbness and headaches.    Physical Exam Updated Vital Signs BP (!) 164/75   Pulse (!) 53   Temp 97.7 F (36.5 C) (Oral)   Resp 10   Ht 5\' 8"  (1.727 m)   Wt 65.8 kg   SpO2 96%   BMI 22.05 kg/m   Physical Exam Vitals and nursing note reviewed.  Constitutional:      Appearance: He is well-developed.  HENT:     Head: Normocephalic and atraumatic.  Eyes:     General:  Right eye: No discharge.        Left eye: No discharge.     Conjunctiva/sclera: Conjunctivae normal.  Neck:     Trachea: No tracheal deviation.  Cardiovascular:     Rate and Rhythm: Normal rate and regular rhythm.  Pulmonary:     Effort: Pulmonary effort is normal.     Breath sounds: Normal breath sounds.  Abdominal:     General: There is no distension.     Palpations: Abdomen is soft.     Tenderness: There is no abdominal tenderness. There is no guarding.  Musculoskeletal:        General: Tenderness (parapsinal and midline lumbar) present.     Cervical back: Normal range of motion and neck supple.  Skin:    General: Skin is warm.     Findings: No rash.  Neurological:     General: No focal deficit present.     Mental Status: He is alert and oriented to person, place, and time.     GCS: GCS eye subscore is 4. GCS verbal subscore is 5. GCS motor subscore is 6.     Comments: Patient has 5+ strength all extremities bilateral, sensation intact in lower extremities equal.  Psychiatric:        Mood and Affect: Mood is anxious.     ED Results / Procedures / Treatments   Labs (all labs ordered are  listed, but only abnormal results are displayed) Labs Reviewed  COMPREHENSIVE METABOLIC PANEL - Abnormal; Notable for the following components:      Result Value   Glucose, Bld 137 (*)    Alkaline Phosphatase 306 (*)    All other components within normal limits  CBC WITH DIFFERENTIAL/PLATELET  URINALYSIS, ROUTINE W REFLEX MICROSCOPIC    EKG EKG Interpretation  Date/Time:  Wednesday June 27 2019 11:28:00 EDT Ventricular Rate:  52 PR Interval:    QRS Duration: 97 QT Interval:  478 QTC Calculation: 445 R Axis:   1 Text Interpretation: Sinus rhythm Short PR interval Borderline repolarization abnormality Baseline wander in lead(s) V1 Confirmed by Elnora Morrison [phone]) on 06/27/2019 12:33:18 PM   Radiology No results found.  Procedures Procedures (including critical care time)  Medications Ordered in ED Medications  fentaNYL (SUBLIMAZE) injection 50 mcg (50 mcg Intravenous Given 06/27/19 1258)  morphine 4 MG/ML injection 6 mg (6 mg Intravenous Given 06/27/19 1120)  sodium chloride 0.9 % bolus 500 mL (0 mLs Intravenous Stopped 06/27/19 1247)  iohexol (OMNIPAQUE) 350 MG/ML injection 100 mL (100 mLs Intravenous Contrast Given 06/27/19 1137)  HYDROmorphone (DILAUDID) injection 1 mg (1 mg Intravenous Given 06/27/19 1149)    ED Course  I have reviewed the triage vital signs and the nursing notes.  Pertinent labs & imaging results that were available during my care of the patient were reviewed by me and considered in my medical decision making (see chart for details).    MDM Rules/Calculators/A&P                      Patient with prostate cancer history recently in remission presents with severe lower back and pelvic pain.  Discussed differential diagnosis including aortic pathology, malignancy related, kidney stone, other.  CT scan obtained delayed and report, results reviewed showing enlarged thoracic aorta no dissection, likely pulmonary cancer/metastasis, no acute abnormality to  explain pain.  MRI with and without contrast ordered for further delineation.  Patient required 3 IV pain medicines to improve the pain.  Blood pressure elevated  partially due to pain.  Patient care will be signed out to follow-up MRI results and likely admit for pain control. Final Clinical Impression(s) / ED Diagnoses Final diagnoses:  Acute bilateral low back pain with sciatica, sciatica laterality unspecified  Multiple lung nodules on CT  Enlarged thoracic aorta [facility])    Rx / DC Orders ED Discharge Orders    None       Elnora Morrison, MD 06/28/19 1538

## 2019-06-27 NOTE — H&P (Signed)
TRH H&P   Patient Demographics:    Keith Olson, is a 79 y.o. male  MRN: [id number]   DOB - [birth year]  Admit Date - 06/27/2019  Outpatient Primary MD for the patient is Dettinger, Fransisca Kaufmann, MD  Referring MD/NP/PA: Dr Vanita Panda.  Patient coming from: Home  Chief Complaint  Patient presents with  . Back Pain      HPI:    Keith Olson  is a 79 y.o. male, history of prostate cancer, status post multiple radiation therapy in 2016, CAD, hyperlipidemia, hypothyroidism, patient presents to ED secondary to complaints of severe lower back pain, with radiation to lower extremities, he reports this weakness has been going on for last 2 weeks, but significantly worsened over last 3 days, reports is feeling like he was getting to the best out, as well he does report some abdominal and pelvic pain, which has been chronic, he denies any chest pain, shortness of breath, diarrhea, constipation, fever or chills, no cough, no nausea or vomiting. - in ED patient had CT a chest/abdomen/pelvis, negative for PE, but significant for new mass suspicious for bronchogenic carcinoma with lymph involvement, he had MRI cervical/thoracic/lumbar spine which was significant for metastatic osseous disease, with T3 fracture but no cord compression, L4 fracture with severe spinal stenosis and cauda equina narrowing, ED discussed with neuro surgery who recommended IV steroids to [facility], otherwise no significant lab abnormalities. -Of note patient had recent cardiac cath by cardiology in March of this year, with patent stents.     Review of systems:    In addition to the HPI above,  No Fever-chills, headedness No Headache, No changes with Vision or hearing, No problems swallowing food or Liquids, No Chest pain, Cough or Shortness of Breath, Reports abdominal pain, No Nausea or Vommitting, Bowel movements  are regular, No Blood in stool or Urine, No dysuria, No new skin rashes or bruises, No new joints pains-aches, reports back pain, with unsteady gait. No new weakness, tingling, numbness in any extremity, No recent weight gain or loss, No polyuria, polydypsia or polyphagia, No significant Mental Stressors.  A full 10 point Review of Systems was done, except as stated above, all other Review of Systems were negative.   With Past History of the following :    Past Medical History:  Diagnosis Date  . Agent orange exposure   . Arthritis   . Coronary artery disease    BMS to mid LAD 2001, DES to proximal LAD 2017  . Enlarged prostate    XRT 2016  . Headache    Sinus headaches   . History of depression   . Hyperlipidemia   . Hypothyroidism   . Prostate cancer ([place]) 07/2014   hormonal therapy, external beam radiation therapy  . PTSD (post-traumatic stress disorder)       Past Surgical History:  Procedure Laterality Date  .  CARDIAC CATHETERIZATION N/A 12/12/2015   Procedure: Left Heart Cath and Coronary Angiography;  Surgeon: Peter M Martinique, MD;  Location: [facility];  Service: Cardiovascular;  Laterality: N/A;  . CARDIAC CATHETERIZATION N/A 12/12/2015   Procedure: Coronary Stent Intervention;  Surgeon: Peter M Martinique, MD;  Location: [facility];  Service: Cardiovascular;  Laterality: N/A;  . CORONARY STENT INTERVENTION N/A 09/06/2017   Procedure: CORONARY STENT INTERVENTION;  Surgeon: Burnell Blanks, MD;  Location: [facility];  Service: Cardiovascular;  Laterality: N/A;  . HERNIA REPAIR Right   . LEFT HEART CATH AND CORONARY ANGIOGRAPHY N/A 09/02/2016   Procedure: Left Heart Cath and Coronary Angiography;  Surgeon: Leonie Man, MD;  Location: [facility];  Service: Cardiovascular;  Laterality: N/A;  . LEFT HEART CATH AND CORONARY ANGIOGRAPHY N/A 09/06/2017   Procedure: LEFT HEART CATH AND CORONARY ANGIOGRAPHY;  Surgeon: Burnell Blanks, MD;  Location: [facility];  Service: Cardiovascular;  Laterality: N/A;  . LEFT HEART CATH AND CORONARY ANGIOGRAPHY N/A 05/04/2019   Procedure: LEFT HEART CATH AND CORONARY ANGIOGRAPHY;  Surgeon: Martinique, Peter M, MD;  Location: [facility];  Service: Cardiovascular;  Laterality: N/A;  . PROSTATE BIOPSY N/A 08/28/2014   Procedure: BIOPSY TRANSRECTAL ULTRASONIC PROSTATE (TUBP);  Surgeon: Rana Snare, MD;  Location: [facility];  Service: Urology;  Laterality: N/A;  . TRANSURETHRAL RESECTION OF PROSTATE N/A 08/28/2014   Procedure: TRANSURETHRAL RESECTION OF THE PROSTATE WITH GYRUS INSTRUMENTS;  Surgeon: Rana Snare, MD;  Location: [facility];  Service: Urology;  Laterality: N/A;      Social History:     Social History   Tobacco Use  . Smoking status: Never Smoker  . Smokeless tobacco: Former Systems developer    Types: Chew  Substance Use Topics  . Alcohol use: No        Family History :     Family History  Problem Relation Age of Onset  . Arthritis Mother   . Heart attack Father       Home Medications:   Prior to Admission medications   Medication Sig Start Date End Date Taking? Authorizing Provider  aspirin EC 81 MG tablet Take 1 tablet (81 mg total) by mouth daily. 12/09/15  Yes Satira Sark, MD  diclofenac sodium (VOLTAREN) 1 % GEL APPLY 2 GRAMS TOPICALLY 4 TIMES DAILY Patient taking differently: Apply 2 g topically 4 (four) times daily as needed.  12/21/18  Yes Dettinger, Fransisca Kaufmann, MD  HYDROcodone-acetaminophen (NORCO) 10-325 MG tablet Take 1 tablet by mouth every 6 (six) hours as needed. 06/26/19  Yes [provider]  levothyroxine (SYNTHROID, LEVOTHROID) 75 MCG tablet Take 1 tablet (75 mcg total) by mouth daily before breakfast. 09/16/17  Yes Dettinger, Fransisca Kaufmann, MD  tamsulosin (FLOMAX) 0.4 MG CAPS capsule Take 0.4 mg by mouth at bedtime. 05/23/18  Yes [provider]  ALPRAZolam Duanne Moron) 0.5 MG tablet Take 0.5 mg by mouth 2 (two) times daily as needed.  04/05/19   [provider]  celecoxib (CELEBREX) 100 MG capsule Take 100 capsules by mouth 2 (two) times daily as needed.  01/24/19   [provider]  nitroGLYCERIN (NITROSTAT) 0.4 MG SL tablet Place 1 tablet (0.4 mg total) under the tongue every 5 (five) minutes as needed for chest pain. X 3 doses Patient taking differently: Place 0.4 mg under the tongue every 5 (five) minutes as needed for chest pain.  09/07/17   Leanor Kail, PA  rosuvastatin (CRESTOR) 40 MG tablet Take 1 tablet (40 mg total) by mouth daily. Patient not taking: Reported on 06/27/2019 09/07/17   Leanor Kail, PA  SUMAtriptan (IMITREX) 25 MG tablet TAKE 1 TABLET BY MOUTH EVERY 2 HOURS AS NEEDED FOR MIGRAINE. MAY REPEAT IN 2 HOURS IF HEADACHE PERSISTS OR RECURS Patient taking differently: Take 25 mg by mouth every 2 (two) hours as needed for migraine or headache. TAKE 1 TABLET BY MOUTH EVERY 2 HOURS AS NEEDED FOR MIGRAINE. MAY REPEAT IN 2 HOURS IF HEADACHE PERSISTS OR RECURS 12/08/18   Dettinger, Fransisca Kaufmann, MD     Allergies:     Allergies  Allergen Reactions  . Lipitor [Atorvastatin]     Muscles aches     Physical Exam:   Vitals  Blood pressure (!) 149/68, pulse (!) 58, temperature 97.7 F (36.5 C), temperature source Oral, resp. rate 16, height 5\' 8"  (1.727 m), weight 65.8 kg, SpO2 94 %.   1. General developed male, laying in bed, in no apparent distress  2. Normal affect and insight, Not Suicidal or Homicidal, Awake Alert, Oriented X 3.  3. No F.N deficits, ALL C.Nerves Intact, Strength 5/5 all 4 extremities, Sensation intact all 4 extremities, Plantars down going.  4. Ears and Eyes appear Normal, Conjunctivae clear, PERRLA. Moist Oral Mucosa.  5. Supple Neck, No JVD, No cervical lymphadenopathy appriciated, No Carotid Bruits.  6. Symmetrical Chest wall movement, Good air movement bilaterally, CTAB.  7. RRR, No Gallops, Rubs or Murmurs, No Parasternal Heave.  8. Positive Bowel  Sounds, Abdomen Soft, No tenderness, No organomegaly appriciated,No rebound -guarding or rigidity.  9.  No Cyanosis, Normal Skin Turgor, No Skin Rash or Bruise.  10. Good muscle tone,  joints appear normal , no effusions, Normal ROM.  Patient with paraspinal and midline lumbar tenderness     Data Review:    CBC Recent Labs  Lab 06/27/19 1107  WBC 8.4  HGB 14.6  HCT 43.5  PLT 244  MCV 94.4  MCH 31.7  MCHC 33.6  RDW 12.8  LYMPHSABS 1.1  MONOABS 0.5  EOSABS 0.0  BASOSABS 0.1   ------------------------------------------------------------------------------------------------------------------  Chemistries  Recent Labs  Lab 06/27/19 1107  NA 135  K 4.2  CL 98  CO2 23  GLUCOSE 137*  BUN 17  CREATININE 0.61  CALCIUM 9.6  AST 28  ALT 23  ALKPHOS 306*  BILITOT 0.7   ------------------------------------------------------------------------------------------------------------------ estimated creatinine clearance is 72 mL/min (by C-G formula based on SCr of 0.61 mg/dL). ------------------------------------------------------------------------------------------------------------------ No results for input(s): TSH, T4TOTAL, T3FREE, THYROIDAB in the last 72 hours.  Invalid input(s): FREET3  Coagulation profile No results for input(s): INR, PROTIME in the last 168 hours. ------------------------------------------------------------------------------------------------------------------- No results for input(s): DDIMER in the last 72 hours. -------------------------------------------------------------------------------------------------------------------  Cardiac Enzymes No results for input(s): CKMB, TROPONINI, MYOGLOBIN in the last 168 hours.  Invalid input(s): CK ------------------------------------------------------------------------------------------------------------------    Component Value Date/Time   BNP 16.0 09/05/2017 1840      ---------------------------------------------------------------------------------------------------------------  Urinalysis    Component Value Date/Time   COLORURINE YELLOW 06/27/2019 1756   APPEARANCEUR CLEAR 06/27/2019 1756   APPEARANCEUR Clear 05/22/2018 1547   LABSPEC 1.043 (H) 06/27/2019 1756   PHURINE 6.0 06/27/2019 1756   GLUCOSEU NEGATIVE 06/27/2019 1756   HGBUR NEGATIVE 06/27/2019 1756   BILIRUBINUR NEGATIVE 06/27/2019 1756   BILIRUBINUR Negative 05/22/2018 1547   KETONESUR 5 (A) 06/27/2019 1756   PROTEINUR NEGATIVE 06/27/2019 1756   UROBILINOGEN negative 03/07/2014 1059   NITRITE NEGATIVE 06/27/2019 1756  LEUKOCYTESUR NEGATIVE 06/27/2019 1756    ----------------------------------------------------------------------------------------------------------------   Imaging Results:    MR CERVICAL SPINE W WO CONTRAST  Result Date: 06/27/2019 CLINICAL DATA:  Back pain, history of prostate cancer, new suspected lung cancer EXAM: MRI CERVICAL, THORACIC AND LUMBAR SPINE WITHOUT AND WITH CONTRAST CONTRAST:  7 mL Gadavist TECHNIQUE: Multiplanar and multiecho pulse sequences of the cervical spine, to include the craniocervical junction and cervicothoracic junction, and thoracic and lumbar spine, were obtained without intravenous contrast. COMPARISON:  None. FINDINGS: MRI CERVICAL SPINE Motion artifact is present Alignment: Anteroposterior alignment is maintained. Vertebrae: There is abnormal marrow signal at multiple levels with greatest involvement of C3 and C4 vertebral bodies. There no compression deformity. No significant epidural disease noted. Cord: No definite abnormal signal within the above limitation. Posterior Fossa, vertebral arteries, paraspinal tissues: Unremarkable. Disc levels: Multilevel disc bulges, endplate osteophytes, and facet and uncovertebral hypertrophy. There is no high-grade canal stenosis. Multilevel foraminal stenosis is present and poorly evaluated due to  artifact. MRI THORACIC SPINE Motion artifact is present Alignment:  Anteroposterior alignment is maintained. Vertebrae: Multifocal abnormal marrow signal with greatest involvement of T2-T4 and T11 and T12. There is mild to moderate compression deformity of T3 with less than 50% loss of height. Probable mild ventral epidural extension at this level. There is some foraminal extension at T2-T3, T3-T4, and T4-T5 levels. Cord:  No abnormal signal within the above limitation. Paraspinal and other soft tissues: Intrathoracic findings are better evaluated on the prior chest CT. Disc levels: Mild degenerative changes are present without significant degenerative stenosis. MRI LUMBAR SPINE Motion artifact is present. Segmentation: Standard. Alignment:  Mild degenerative listhesis. Vertebrae: There is multifocal abnormal marrow signal throughout the lumbosacral spine and imaged bony pelvis. There is mild compression deformity of L4 with less than 50% loss of height. Ventral epidural disease is present at this level with resulting severe canal stenosis and crowding of cauda equina. Conus medullaris and cauda equina: Conus extends to the L1 level. Paraspinal and other soft tissues: Unremarkable Disc levels: L1-L2:  Disc bulge.  No significant canal or foraminal stenosis. L2-L3: Disc bulge with endplate osteophytic ridging and facet arthropathy with ligamentum flavum infolding. Moderate canal stenosis. Mild foraminal stenosis. L3-L4: Disc bulge with endplate osteophytic ridging and marked facet arthropathy with ligamentum flavum infolding. Marked canal stenosis with effacement of the lateral recesses. Moderate foraminal stenosis. L4-L5: Disc bulge with endplate osteophytic ridging and moderate right and marked left facet arthropathy with ligamentum flavum infolding. Moderate to marked canal stenosis. Moderate to marked foraminal stenosis. L5-S1: Disc bulge with endplate osteophytic ridging and moderate facet arthropathy. No canal  stenosis. Moderate to marked foraminal stenosis. IMPRESSION: Motion degraded study with suboptimal evaluation. Diffuse osseous metastatic disease. Mild to moderate compression deformity of T3 with mild ventral epidural extension. No cord compression. Mild compression deformity of L4 with ventral epidural extension resulting in severe canal stenosis and crowding of cauda equina. Multilevel degenerative changes, greatest at lumbar levels with significant canal stenosis at L3-L4 and L4-L5. These results were called by telephone at the time of interpretation on 06/27/2019 at 6:21 pm to provider Dr. Vanita Panda, who verbally acknowledged these results. Electronically Signed   By: Macy Mis M.D.   On: 06/27/2019 18:22   MR THORACIC SPINE W WO CONTRAST  Result Date: 06/27/2019 CLINICAL DATA:  Back pain, history of prostate cancer, new suspected lung cancer EXAM: MRI CERVICAL, THORACIC AND LUMBAR SPINE WITHOUT AND WITH CONTRAST CONTRAST:  7 mL Gadavist TECHNIQUE: Multiplanar and multiecho pulse  sequences of the cervical spine, to include the craniocervical junction and cervicothoracic junction, and thoracic and lumbar spine, were obtained without intravenous contrast. COMPARISON:  None. FINDINGS: MRI CERVICAL SPINE Motion artifact is present Alignment: Anteroposterior alignment is maintained. Vertebrae: There is abnormal marrow signal at multiple levels with greatest involvement of C3 and C4 vertebral bodies. There no compression deformity. No significant epidural disease noted. Cord: No definite abnormal signal within the above limitation. Posterior Fossa, vertebral arteries, paraspinal tissues: Unremarkable. Disc levels: Multilevel disc bulges, endplate osteophytes, and facet and uncovertebral hypertrophy. There is no high-grade canal stenosis. Multilevel foraminal stenosis is present and poorly evaluated due to artifact. MRI THORACIC SPINE Motion artifact is present Alignment:  Anteroposterior alignment is  maintained. Vertebrae: Multifocal abnormal marrow signal with greatest involvement of T2-T4 and T11 and T12. There is mild to moderate compression deformity of T3 with less than 50% loss of height. Probable mild ventral epidural extension at this level. There is some foraminal extension at T2-T3, T3-T4, and T4-T5 levels. Cord:  No abnormal signal within the above limitation. Paraspinal and other soft tissues: Intrathoracic findings are better evaluated on the prior chest CT. Disc levels: Mild degenerative changes are present without significant degenerative stenosis. MRI LUMBAR SPINE Motion artifact is present. Segmentation: Standard. Alignment:  Mild degenerative listhesis. Vertebrae: There is multifocal abnormal marrow signal throughout the lumbosacral spine and imaged bony pelvis. There is mild compression deformity of L4 with less than 50% loss of height. Ventral epidural disease is present at this level with resulting severe canal stenosis and crowding of cauda equina. Conus medullaris and cauda equina: Conus extends to the L1 level. Paraspinal and other soft tissues: Unremarkable Disc levels: L1-L2:  Disc bulge.  No significant canal or foraminal stenosis. L2-L3: Disc bulge with endplate osteophytic ridging and facet arthropathy with ligamentum flavum infolding. Moderate canal stenosis. Mild foraminal stenosis. L3-L4: Disc bulge with endplate osteophytic ridging and marked facet arthropathy with ligamentum flavum infolding. Marked canal stenosis with effacement of the lateral recesses. Moderate foraminal stenosis. L4-L5: Disc bulge with endplate osteophytic ridging and moderate right and marked left facet arthropathy with ligamentum flavum infolding. Moderate to marked canal stenosis. Moderate to marked foraminal stenosis. L5-S1: Disc bulge with endplate osteophytic ridging and moderate facet arthropathy. No canal stenosis. Moderate to marked foraminal stenosis. IMPRESSION: Motion degraded study with  suboptimal evaluation. Diffuse osseous metastatic disease. Mild to moderate compression deformity of T3 with mild ventral epidural extension. No cord compression. Mild compression deformity of L4 with ventral epidural extension resulting in severe canal stenosis and crowding of cauda equina. Multilevel degenerative changes, greatest at lumbar levels with significant canal stenosis at L3-L4 and L4-L5. These results were called by telephone at the time of interpretation on 06/27/2019 at 6:21 pm to provider Dr. Vanita Panda, who verbally acknowledged these results. Electronically Signed   By: Macy Mis M.D.   On: 06/27/2019 18:22   MR Lumbar Spine W Wo Contrast  Result Date: 06/27/2019 CLINICAL DATA:  Back pain, history of prostate cancer, new suspected lung cancer EXAM: MRI CERVICAL, THORACIC AND LUMBAR SPINE WITHOUT AND WITH CONTRAST CONTRAST:  7 mL Gadavist TECHNIQUE: Multiplanar and multiecho pulse sequences of the cervical spine, to include the craniocervical junction and cervicothoracic junction, and thoracic and lumbar spine, were obtained without intravenous contrast. COMPARISON:  None. FINDINGS: MRI CERVICAL SPINE Motion artifact is present Alignment: Anteroposterior alignment is maintained. Vertebrae: There is abnormal marrow signal at multiple levels with greatest involvement of C3 and C4 vertebral bodies. There no  compression deformity. No significant epidural disease noted. Cord: No definite abnormal signal within the above limitation. Posterior Fossa, vertebral arteries, paraspinal tissues: Unremarkable. Disc levels: Multilevel disc bulges, endplate osteophytes, and facet and uncovertebral hypertrophy. There is no high-grade canal stenosis. Multilevel foraminal stenosis is present and poorly evaluated due to artifact. MRI THORACIC SPINE Motion artifact is present Alignment:  Anteroposterior alignment is maintained. Vertebrae: Multifocal abnormal marrow signal with greatest involvement of T2-T4 and T11  and T12. There is mild to moderate compression deformity of T3 with less than 50% loss of height. Probable mild ventral epidural extension at this level. There is some foraminal extension at T2-T3, T3-T4, and T4-T5 levels. Cord:  No abnormal signal within the above limitation. Paraspinal and other soft tissues: Intrathoracic findings are better evaluated on the prior chest CT. Disc levels: Mild degenerative changes are present without significant degenerative stenosis. MRI LUMBAR SPINE Motion artifact is present. Segmentation: Standard. Alignment:  Mild degenerative listhesis. Vertebrae: There is multifocal abnormal marrow signal throughout the lumbosacral spine and imaged bony pelvis. There is mild compression deformity of L4 with less than 50% loss of height. Ventral epidural disease is present at this level with resulting severe canal stenosis and crowding of cauda equina. Conus medullaris and cauda equina: Conus extends to the L1 level. Paraspinal and other soft tissues: Unremarkable Disc levels: L1-L2:  Disc bulge.  No significant canal or foraminal stenosis. L2-L3: Disc bulge with endplate osteophytic ridging and facet arthropathy with ligamentum flavum infolding. Moderate canal stenosis. Mild foraminal stenosis. L3-L4: Disc bulge with endplate osteophytic ridging and marked facet arthropathy with ligamentum flavum infolding. Marked canal stenosis with effacement of the lateral recesses. Moderate foraminal stenosis. L4-L5: Disc bulge with endplate osteophytic ridging and moderate right and marked left facet arthropathy with ligamentum flavum infolding. Moderate to marked canal stenosis. Moderate to marked foraminal stenosis. L5-S1: Disc bulge with endplate osteophytic ridging and moderate facet arthropathy. No canal stenosis. Moderate to marked foraminal stenosis. IMPRESSION: Motion degraded study with suboptimal evaluation. Diffuse osseous metastatic disease. Mild to moderate compression deformity of T3 with  mild ventral epidural extension. No cord compression. Mild compression deformity of L4 with ventral epidural extension resulting in severe canal stenosis and crowding of cauda equina. Multilevel degenerative changes, greatest at lumbar levels with significant canal stenosis at L3-L4 and L4-L5. These results were called by telephone at the time of interpretation on 06/27/2019 at 6:21 pm to provider Dr. Vanita Panda, who verbally acknowledged these results. Electronically Signed   By: Macy Mis M.D.   On: 06/27/2019 18:22   CT Angio Chest/Abd/Pel for Dissection W and/or Wo Contrast  Result Date: 06/27/2019 CLINICAL DATA:  79 year old male with history of severe lower abdominal and back pain. EXAM: CT ANGIOGRAPHY CHEST, ABDOMEN AND PELVIS TECHNIQUE: Non-contrast CT of the chest was initially obtained. Multidetector CT imaging through the chest, abdomen and pelvis was performed using the standard protocol during bolus administration of intravenous contrast. Multiplanar reconstructed images and MIPs were obtained and reviewed to evaluate the vascular anatomy. CONTRAST:  149mL OMNIPAQUE IOHEXOL 350 MG/ML SOLN COMPARISON:  CT the abdomen and pelvis 04/20/2012. FINDINGS: CTA CHEST FINDINGS Cardiovascular: Precontrast images demonstrate no crescentic high attenuation associated with the wall of the thoracic aorta to suggest acute intramural hematoma. No aneurysm or dissection of the thoracic aorta. 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 Mid thoracic aortic arch measures 2.9 cm in diameter. Descending thoracic aorta measures 2.8 cm in diameter. Heart size is normal. There is no significant pericardial  fluid, thickening or pericardial calcification. There is aortic atherosclerosis, as well as atherosclerosis of the great vessels of the mediastinum and the coronary arteries, including calcified atherosclerotic plaque in the left main, left anterior descending, left circumflex and right coronary arteries.  Mediastinum/Nodes: Enlarged AP window lymph node measuring 1.7 cm in short axis. Borderline enlarged left hilar lymph nodes measuring up to 1.3 cm in short axis. Small hiatal hernia. No axillary lymphadenopathy. Lungs/Pleura: Multiple pulmonary nodules are noted, largest of which is in the left upper lobe (axial image 63 of series 7 and sagittal image 141 of series 10) measuring 2.5 x 1.6 x 1.5 cm. This lesion has macrolobulated slightly ill-defined margins. Several other smaller pulmonary nodules are noted including a 7 x 4 mm left upper lobe nodule (axial image 65 of series 7), and a 1.0 x 0.5 cm nodule in the superior segment of the right lower lobe (axial image 44 of series 7). No acute consolidative airspace disease. No pleural effusions. Musculoskeletal: There are no aggressive appearing lytic or blastic lesions noted in the visualized portions of the skeleton. Review of the MIP images confirms the above findings. CTA ABDOMEN AND PELVIS FINDINGS VASCULAR Aorta: Normal caliber aorta without aneurysm, dissection, vasculitis or significant stenosis. Extensive atherosclerotic disease throughout the abdominal aorta. Celiac: Patent without evidence of dissection, vasculitis or significant stenosis. Mild ectasia of the proximal celiac axis which measures 8 mm in diameter. SMA: Patent without evidence of aneurysm, dissection, vasculitis or significant stenosis. Renals: Both renal arteries are patent without evidence of aneurysm, dissection, vasculitis, fibromuscular dysplasia or significant stenosis. IMA: Patent without evidence of aneurysm, dissection, vasculitis or significant stenosis. Inflow: Patent without evidence of aneurysm, dissection, vasculitis or significant stenosis. Veins: No obvious venous abnormality within the limitations of this arterial phase study. Review of the MIP images confirms the above findings. NON-VASCULAR Hepatobiliary: 1.2 cm low-attenuation lesion in segment 2 of the liver, compatible  with a simple cyst. No other larger more suspicious appearing pulmonary nodules or masses are noted. No intra or extrahepatic biliary ductal dilatation. Gallbladder is normal in appearance. Pancreas: No pancreatic mass. No pancreatic ductal dilatation. No pancreatic or peripancreatic fluid collections or inflammatory changes. Spleen: Unremarkable. Adrenals/Urinary Tract: Subcentimeter low-attenuation lesion in the posterior aspect of the interpolar region of the left kidney, too small to characterize, but statistically likely to represent a tiny cyst. Right kidney and bilateral adrenal glands are normal in appearance. No hydroureteronephrosis. Urinary bladder is normal in appearance. Stomach/Bowel: Normal appearance of the stomach. No pathologic dilatation of small bowel or colon. Numerous colonic diverticulae are noted, without surrounding inflammatory changes to suggest an acute diverticulitis at this time. The appendix is not confidently identified and may be surgically absent. Regardless, there are no inflammatory changes noted adjacent to the cecum to suggest the presence of an acute appendicitis at this time. Lymphatic: No lymphadenopathy noted in the abdomen or pelvis. Reproductive: Fiducial markers in the prostate gland. Other: No significant volume of ascites.  No pneumoperitoneum. Musculoskeletal: There are no aggressive appearing lytic or blastic lesions noted in the visualized portions of the skeleton. Review of the MIP images confirms the above findings. IMPRESSION: 1. No acute findings are noted in the chest, abdomen or pelvis to account for the patient's history of abdominal pain. 2. Multiple pulmonary nodules in the lungs bilaterally, largest of which is a left upper lobe pulmonary nodule measuring 2.5 x 1.6 x 1.5 cm. This is associated with left hilar and AP window lymphadenopathy. Findings are highly concerning  for primary bronchogenic carcinoma with metastatic disease to the lymph nodes and lungs  in the thorax. Further evaluation with PET-CT is recommended in the near future. No definite signs of metastatic disease in the abdomen or pelvis. 3. Colonic diverticulosis without evidence of acute diverticulitis at this time. 4. Aortic atherosclerosis, in addition to left main and 3 vessel coronary artery disease. Assessment for potential risk factor modification, dietary therapy or pharmacologic therapy may be warranted, if clinically indicated. 5. 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 Recommend annual imaging followup by CTA or MRA. This recommendation follows 2010 ACCF/AHA/AATS/ACR/ASA/SCA/SCAI/SIR/STS/SVM Guidelines for the Diagnosis and Management of Patients with Thoracic Aortic Disease. Circulation. 2010; 121: K440-N027. Aortic aneurysm NOS (ICD10-I71.9). 6. Additional incidental findings, as above. Electronically Signed   By: Vinnie Langton M.D.   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 -Findings significant for T3 and L4 fracture, no cord compression but evidence of L4 severe canal stenosis and crowding in caudal [place]. -ED physician discussed with neurosurgery, no need for emergent surgery, he recommended to transfer to [facility], he will be evaluated tomorrow by neurosurgery, recommendation for IV steroids, received IV Decadron 10 mg in ED, will continue 4 mg IV every 6 hours. -Discussed with oncology Dr. Lorenso Courier, he will evaluate tomorrow when patient gets to [facility] long but  further recommendation regarding work-up, may be new lung primary versus recurrent prostate cancer, he  recommended to send PSA which we will do. -Continue with pain control.  History of CAD -Denies any chest pain or shortness of breath -With recent cardiac cath with patent stents, continue with aspirin, statin currently denies any chest pain -No Beta-blocker due to sinus bradycardia per cardiology documentation  Hyperlipidemia -Continue with home statin  BPH -Continue with Flomax  Hypothyroidism -Continue with Synthroid    DVT Prophylaxis Heparin   AM Labs Ordered, also please review Full Orders  Family Communication: Admission, patients condition and plan of care including tests being ordered have been discussed with the patient and son Darlin Coco [phone] who indicate understanding and agree with the plan and Code Status.  Code Status Full   Likely DC to Home  Condition GUARDED    Consults called: neurosurgery Dr Christella Noa by ED, oncology Dr Lorenso Courier  consulted  Admission status:  Inpatient  Time spent in minutes : 60 minutes   Phillips Climes M.D on 06/27/2019 at 8:31 PM  Between 7am to [facility] - Office  [phone]

## 2019-06-27 NOTE — ED Notes (Signed)
Dr Reather Converse at bedside preforming bedside US.

## 2019-06-27 NOTE — ED Notes (Signed)
Pt taken to CT at this time.

## 2019-06-27 NOTE — Progress Notes (Signed)
Patient admitted to [address]. MD notified. Order placed to transfer patient to [place] notified to transfer patient. Report called to [employer] [facility].

## 2019-06-27 NOTE — ED Provider Notes (Signed)
Update: I discussed the MRI results with the patient, already having spoken with our radiology colleagues about them, and reviewing the images myself. Patient remains awake and alert. However, in discussing his presentation with her nursing colleagues, is clear that the patient is incapable of ambulating by himself, is soon as he is upright he is stability is negligible, and ability to walk without complete assistance is similar.  7:42 PM I discussed patient's case with our neurosurgery colleague, Dr. Christella Noa.  No negation for emergent surgery.  Patient will start Decadron, and Dr. Christella Noa or one of his colleagues will see the patient in the morning.  With consideration of the patient's numerous metastatic lesions in his spine, the epidural process identified as well, the patient will require admission, transfer to our affiliated hospital, Dr. Elvina Sidle.    Keith Muskrat, MD 06/27/19 [phone]

## 2019-06-27 NOTE — Progress Notes (Signed)
Patient transported to [facility] via [place]. Belongings transferred with patient.

## 2019-06-28 ENCOUNTER — Encounter (HOSPITAL_COMMUNITY): Payer: Self-pay | Admitting: Internal Medicine

## 2019-06-28 ENCOUNTER — Ambulatory Visit
Admit: 2019-06-28 | Discharge: 2019-06-28 | Disposition: A | Discharge status: Home / Self Care | Payer: Medicare Other | Attending: Radiation Oncology | Admitting: Radiation Oncology

## 2019-06-28 DIAGNOSIS — R918 Other nonspecific abnormal finding of lung field: Secondary | ICD-10-CM

## 2019-06-28 DIAGNOSIS — C7951 Secondary malignant neoplasm of bone: Secondary | ICD-10-CM

## 2019-06-28 DIAGNOSIS — C61 Malignant neoplasm of prostate: Secondary | ICD-10-CM

## 2019-06-28 DIAGNOSIS — M5440 Lumbago with sciatica, unspecified side: Secondary | ICD-10-CM

## 2019-06-28 DIAGNOSIS — C801 Malignant (primary) neoplasm, unspecified: Secondary | ICD-10-CM

## 2019-06-28 LAB — PSA: Prostatic Specific Antigen: 239 ng/mL — ABNORMAL HIGH (ref 0.00–4.00)

## 2019-06-28 MED ORDER — DEXAMETHASONE SODIUM PHOSPHATE 4 MG/ML IJ SOLN
4.0000 mg | Freq: Two times a day (BID) | INTRAMUSCULAR | Status: DC
  Administered 2019-06-28 – 2019-07-06 (×16): 4 mg via INTRAVENOUS
  Filled 2019-06-28 (×16): qty 1

## 2019-06-28 MED ORDER — HYDROCODONE-ACETAMINOPHEN 10-325 MG PO TABS
1.0000 | ORAL_TABLET | Freq: Four times a day (QID) | ORAL | Status: DC | PRN
  Administered 2019-06-28 – 2019-07-01 (×3): 1 via ORAL
  Filled 2019-06-28: qty 1
  Filled 2019-06-28: qty 2
  Filled 2019-06-28: qty 1

## 2019-06-28 MED ORDER — METHOCARBAMOL 1000 MG/10ML IJ SOLN
1000.0000 mg | Freq: Three times a day (TID) | INTRAVENOUS | Status: AC
  Administered 2019-06-28 – 2019-06-30 (×8): 1000 mg via INTRAVENOUS
  Filled 2019-06-28 (×3): qty 1000
  Filled 2019-06-28 (×3): qty 10
  Filled 2019-06-28: qty 1000
  Filled 2019-06-28: qty 10

## 2019-06-28 MED ORDER — METHOCARBAMOL 1000 MG/10ML IJ SOLN: 1000.0000 mg | Freq: Three times a day (TID) | INTRAVENOUS | Status: DC

## 2019-06-28 MED ORDER — HYDROMORPHONE HCL 1 MG/ML IJ SOLN
1.0000 mg | INTRAMUSCULAR | Status: DC | PRN
  Administered 2019-06-28 – 2019-07-03 (×24): 1 mg via INTRAVENOUS
  Filled 2019-06-28 (×26): qty 1

## 2019-06-28 MED ORDER — METHOCARBAMOL 1000 MG/10ML IJ SOLN: 500.0000 mg | Freq: Three times a day (TID) | INTRAVENOUS | Status: DC

## 2019-06-28 MED ORDER — MORPHINE SULFATE (PF) 2 MG/ML IV SOLN
2.0000 mg | INTRAVENOUS | Status: DC | PRN
  Administered 2019-06-28: 2 mg via INTRAVENOUS
  Filled 2019-06-28: qty 1

## 2019-06-28 MED ORDER — LEUPROLIDE ACETATE 7.5 MG IM KIT: 7.5000 mg | PACK | Freq: Once | INTRAMUSCULAR | Status: DC

## 2019-06-28 MED ORDER — BICALUTAMIDE 50 MG PO TABS
50.0000 mg | ORAL_TABLET | Freq: Every day | ORAL | Status: DC
  Administered 2019-06-29 – 2019-07-06 (×8): 50 mg via ORAL
  Filled 2019-06-28 (×8): qty 1

## 2019-06-28 NOTE — Progress Notes (Signed)
  Radiation Oncology         [phone]) [phone] ________________________________  Name: Keith Olson. MRN: [id number]  Date: 06/28/2019  DOB: Jan 26, 1941  SIMULATION AND TREATMENT PLANNING NOTE    ICD-10-CM   1. 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 Malignant neoplasm of prostate ([place])  C61     DIAGNOSIS:  79 y.o. patient with lumbar metastasis  NARRATIVE:  The patient was brought to the [place].  Identity was confirmed.  All relevant records and images related to the planned course of therapy were reviewed.  The patient freely provided informed written consent to proceed with treatment after reviewing the details related to the planned course of therapy. The consent form was witnessed and verified by the simulation staff.  Then, the patient was set-up in a stable reproducible  supine position for radiation therapy.  CT images were obtained.  Surface markings were placed.  The CT images were loaded into the planning software.  Then the target and avoidance structures were contoured including kidneys.  Treatment planning then occurred.  The radiation prescription was entered and confirmed.  Then, I designed and supervised the construction of a total of 3 medically necessary complex treatment devices with VacLoc positioner and 2 MLCs to shield kidneys.  I have requested : 3D Simulation  I have requested a DVH of the following structures: Left Kidney, Right Kidney and target.  PLAN:  The patient will receive 30 Gy in 10 fractions.  ________________________________  Sheral Apley Tammi Klippel, M.D.

## 2019-06-28 NOTE — Progress Notes (Signed)
[place]         [phone] ________________________________  Initial inpatient Consultation  Name: Keith Olson. MRN: [id number]  Date: 06/28/2019  DOB: [birth year]  REFERRING PHYSICIAN: Maryanna Shape, NP  DIAGNOSIS:  79 yo man with a painful L4 metastasis in a background of both prostate cancer and bronchogenic carcinoma.   - Stage IV    ICD-10-CM   1. Malignant neoplasm of prostate ([place])  C61   2. 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 is a 79 y.o. male who with known history prostate cancer, and newly diagnosed bronchogenic cancer, whom has not felt right about pelvic region for approximately 6 months. He states he has had increased pain in his lower back and pelvic region for approximately a month, with a severe exacerbation over last two weeks, and much worse on day of admission. He has had difficulty with bowel movements, and states urination has been difficult also. Mri today at [facility] revealed multiple metastatic lesions in the spine, and pathologic fractures in the thoracic and lumbar spine. I was called for surgical evaluation. Keith Olson states he has difficulty ambulating due to pain, not weakness MRI shows multifocal abnormal marrow signal throughout the lumbosacral spine and imaged bony pelvis. There is mild compression deformity of L4 with less than 50% loss of height. Ventral epidural disease is present at this level with resulting severe canal stenosis and crowding of cauda equina I have been asked to see him for palliative radiotherapy.  PREVIOUS RADIATION THERAPY: Yes prostate  Past Medical History:  Diagnosis Date  . Agent orange exposure   . Arthritis   . Coronary artery disease    BMS to mid LAD 2001, DES to proximal LAD 2017  . Enlarged prostate    XRT 2016  . Headache    Sinus headaches   . History of depression   .  Hyperlipidemia   . Hypothyroidism   . Prostate cancer ([place]) 07/2014   hormonal therapy, external beam radiation therapy  . PTSD (post-traumatic stress disorder)   :  Past Surgical History:  Procedure Laterality Date  . CARDIAC CATHETERIZATION N/A 12/12/2015   Procedure: Left Heart Cath and Coronary Angiography;  Surgeon: Peter M Martinique, MD;  Location: [place] [facility];  Service: Cardiovascular;  Laterality: N/A;  . CARDIAC CATHETERIZATION N/A 12/12/2015   Procedure: Coronary Stent Intervention;  Surgeon: Peter M Martinique, MD;  Location: [place] [facility];  Service: Cardiovascular;  Laterality: N/A;  . CORONARY STENT INTERVENTION N/A 09/06/2017   Procedure: CORONARY STENT INTERVENTION;  Surgeon: Burnell Blanks, MD;  Location: [place] [facility];  Service: Cardiovascular;  Laterality: N/A;  . HERNIA REPAIR Right   . LEFT HEART CATH AND CORONARY ANGIOGRAPHY N/A 09/02/2016   Procedure: Left Heart Cath and Coronary Angiography;  Surgeon: Leonie Man, MD;  Location: [place] [facility];  Service: Cardiovascular;  Laterality: N/A;  . LEFT HEART CATH AND CORONARY ANGIOGRAPHY N/A 09/06/2017   Procedure: LEFT HEART CATH AND CORONARY ANGIOGRAPHY;  Surgeon: Burnell Blanks, MD;  Location: [place] [facility];  Service: Cardiovascular;  Laterality: N/A;  . LEFT HEART CATH AND CORONARY ANGIOGRAPHY N/A 05/04/2019   Procedure: LEFT HEART CATH AND CORONARY ANGIOGRAPHY;  Surgeon: Martinique, Peter M, MD;  Location: [place] [facility];  Service: Cardiovascular;  Laterality: N/A;  . PROSTATE BIOPSY N/A  08/28/2014   Procedure: BIOPSY TRANSRECTAL ULTRASONIC PROSTATE (TUBP);  Surgeon: Rana Snare, MD;  Location: [facility];  Service: Urology;  Laterality: N/A;  . TRANSURETHRAL RESECTION OF PROSTATE N/A 08/28/2014   Procedure: TRANSURETHRAL RESECTION OF THE PROSTATE WITH GYRUS INSTRUMENTS;  Surgeon: Rana Snare, MD;  Location: [facility];  Service: Urology;  Laterality: N/A;  :  No current facility-administered  medications for this encounter. No current outpatient medications on file.  Facility-Administered Medications Ordered in Other Encounters:  .  ALPRAZolam (XANAX) tablet 0.5 mg, 0.5 mg, Oral, BID PRN, Elgergawy, Silver Huguenin, MD .  aspirin EC tablet 81 mg, 81 mg, Oral, Daily, Elgergawy, Silver Huguenin, MD, 81 mg at 06/28/19 0931 .  dexamethasone (DECADRON) injection 4 mg, 4 mg, Intravenous, Q12H, Ashok Pall, MD, 4 mg at 06/28/19 0930 .  gadobutrol (GADAVIST) 1 MMOL/ML injection 7 mL, 7 mL, Intravenous, Once PRN, Elgergawy, Silver Huguenin, MD .  heparin injection 5,000 Units, 5,000 Units, Subcutaneous, Q8H, Elgergawy, Silver Huguenin, MD, 5,000 Units at 06/28/19 0559 .  HYDROcodone-acetaminophen (NORCO) 10-325 MG per tablet 1-2 tablet, 1-2 tablet, Oral, Q6H PRN, Oretha Milch D, MD, 1 tablet at 06/28/19 1152 .  [START ON 06/29/2019] leuprolide (LUPRON) injection 7.5 mg, 7.5 mg, Intramuscular, Once, Curcio, Kristin R, NP .  levothyroxine (SYNTHROID) tablet 75 mcg, 75 mcg, Oral, QAC breakfast, Elgergawy, Silver Huguenin, MD, 75 mcg at 06/28/19 0559 .  methocarbamol (ROBAXIN) 1,000 mg in dextrose 5 % 100 mL IVPB, 1,000 mg, Intravenous, Q8H, Nettey, Shayla D, MD .  morphine 2 MG/ML injection 2 mg, 2 mg, Intravenous, Q3H PRN, Oretha Milch D, MD, 2 mg at 06/28/19 1212 .  pantoprazole (PROTONIX) EC tablet 40 mg, 40 mg, Oral, Daily, Elgergawy, Silver Huguenin, MD, 40 mg at 06/28/19 0930 .  rosuvastatin (CRESTOR) tablet 40 mg, 40 mg, Oral, Daily, Elgergawy, Silver Huguenin, MD, 40 mg at 06/28/19 0931 .  tamsulosin (FLOMAX) capsule 0.4 mg, 0.4 mg, Oral, QHS, Elgergawy, Silver Huguenin, MD, 0.4 mg at 06/27/19 2231:  Allergies  Allergen Reactions  . Lipitor [Atorvastatin]     Muscles aches  :  Family History  Problem Relation Age of Onset  . Arthritis Mother   . Heart attack Father   :  Social History   Socioeconomic History  . Marital status: Married    Spouse name: Not on file  . Number of children: 5  . Years of education: Not on file    . Highest education level: Not on file  Occupational History  . Occupation: retired  Tobacco Use  . Smoking status: Never Smoker  . Smokeless tobacco: Former Systems developer    Types: Chew  Substance and Sexual Activity  . Alcohol use: No  . Drug use: No  . Sexual activity: Yes  Other Topics Concern  . Not on file  Social History Narrative   Married   5 children   Social Determinants of Health   Financial Resource Strain:   . Difficulty of Paying Living Expenses:   Food Insecurity:   . Worried About Charity fundraiser in the Last Year:   . Arboriculturist in the Last Year:   Transportation Needs:   . Film/video editor (Medical):   Marland Kitchen Lack of Transportation (Non-Medical):   Physical Activity:   . Days of Exercise per Week:   . Minutes of Exercise per Session:   Stress:   . Feeling of Stress :   Social Connections:   . Frequency of Communication with Friends and  Family:   . Frequency of Social Gatherings with Friends and Family:   . Attends Religious Services:   . Active Member of Clubs or Organizations:   . Attends Archivist Meetings:   Marland Kitchen Marital Status:   Intimate Partner Violence:   . Fear of Current or Ex-Partner:   . Emotionally Abused:   Marland Kitchen Physically Abused:   . Sexually Abused:   :  REVIEW OF SYSTEMS:  A 15 point review of systems is documented in the electronic medical record. This was obtained by the nursing staff. However, I reviewed this with the patient to discuss relevant findings and make appropriate changes.  Pertinent items are noted in HPI.   PHYSICAL EXAM:  There were no vitals taken for this visit. per neurosurgery -  Constitutional: He is oriented to person, place, and time. He appears well-developed and well-nourished. He appears distressed.  HENT:  Head: Normocephalic and atraumatic.  Right Ear: External ear normal.  Left Ear: External ear normal.  Nose: Nose normal.  Mouth/Throat: Oropharynx is clear and moist.  Eyes: Pupils are  equal, round, and reactive to light. Conjunctivae and EOM are normal.  Cardiovascular: Normal rate, regular rhythm and normal heart sounds.  Respiratory: Effort normal and breath sounds normal.  GI: Soft. Bowel sounds are normal.  Musculoskeletal:        General: Normal range of motion.     Cervical back: Normal range of motion and neck supple.  Neurological: He is alert and oriented to person, place, and time. He has normal reflexes. He displays normal reflexes. No cranial nerve deficit or sensory deficit. He exhibits normal muscle tone. Coordination normal.  Gait not assessed  Skin: Skin is warm and dry.  Psychiatric: He has a normal mood and affect. His behavior is normal. Judgment and thought content normal.   KPS = 40  100 - Normal; no complaints; no evidence of disease. 90   - Able to carry on normal activity; minor signs or symptoms of disease. 80   - Normal activity with effort; some signs or symptoms of disease. 59   - Cares for self; unable to carry on normal activity or to do active work. 60   - Requires occasional assistance, but is able to care for most of his personal needs. 50   - Requires considerable assistance and frequent medical care. 73   - Disabled; requires special care and assistance. 55   - Severely disabled; hospital admission is indicated although death not imminent. 28   - Very sick; hospital admission necessary; active supportive treatment necessary. 10   - Moribund; fatal processes progressing rapidly. 0     - Dead  Karnofsky DA, Abelmann [place], Craver LS and Burchenal [facility] [phone]) The use of the nitrogen mustards in the palliative treatment of carcinoma: with particular reference to bronchogenic carcinoma Cancer 1 634-56  LABORATORY DATA:  Lab Results  Component Value Date   WBC 8.4 06/27/2019   HGB 14.6 06/27/2019   HCT 43.5 06/27/2019   MCV 94.4 06/27/2019   PLT 244 06/27/2019   Lab Results  Component Value Date   NA 135 06/27/2019   K 4.2 06/27/2019    CL 98 06/27/2019   CO2 23 06/27/2019   Lab Results  Component Value Date   ALT 23 06/27/2019   AST 28 06/27/2019   ALKPHOS 306 (H) 06/27/2019   BILITOT 0.7 06/27/2019     RADIOGRAPHY: MR CERVICAL SPINE W WO CONTRAST  Result Date: 06/27/2019 CLINICAL DATA:  Back pain, history of prostate cancer, new suspected lung cancer EXAM: MRI CERVICAL, THORACIC AND LUMBAR SPINE WITHOUT AND WITH CONTRAST CONTRAST:  7 mL Gadavist TECHNIQUE: Multiplanar and multiecho pulse sequences of the cervical spine, to include the craniocervical junction and cervicothoracic junction, and thoracic and lumbar spine, were obtained without intravenous contrast. COMPARISON:  None. FINDINGS: MRI CERVICAL SPINE Motion artifact is present Alignment: Anteroposterior alignment is maintained. Vertebrae: There is abnormal marrow signal at multiple levels with greatest involvement of C3 and C4 vertebral bodies. There no compression deformity. No significant epidural disease noted. Cord: No definite abnormal signal within the above limitation. Posterior Fossa, vertebral arteries, paraspinal tissues: Unremarkable. Disc levels: Multilevel disc bulges, endplate osteophytes, and facet and uncovertebral hypertrophy. There is no high-grade canal stenosis. Multilevel foraminal stenosis is present and poorly evaluated due to artifact. MRI THORACIC SPINE Motion artifact is present Alignment:  Anteroposterior alignment is maintained. Vertebrae: Multifocal abnormal marrow signal with greatest involvement of T2-T4 and T11 and T12. There is mild to moderate compression deformity of T3 with less than 50% loss of height. Probable mild ventral epidural extension at this level. There is some foraminal extension at T2-T3, T3-T4, and T4-T5 levels. Cord:  No abnormal signal within the above limitation. Paraspinal and other soft tissues: Intrathoracic findings are better evaluated on the prior chest CT. Disc levels: Mild degenerative changes are present without  significant degenerative stenosis. MRI LUMBAR SPINE Motion artifact is present. Segmentation: Standard. Alignment:  Mild degenerative listhesis. Vertebrae: There is multifocal abnormal marrow signal throughout the lumbosacral spine and imaged bony pelvis. There is mild compression deformity of L4 with less than 50% loss of height. Ventral epidural disease is present at this level with resulting severe canal stenosis and crowding of cauda equina. Conus medullaris and cauda equina: Conus extends to the L1 level. Paraspinal and other soft tissues: Unremarkable Disc levels: L1-L2:  Disc bulge.  No significant canal or foraminal stenosis. L2-L3: Disc bulge with endplate osteophytic ridging and facet arthropathy with ligamentum flavum infolding. Moderate canal stenosis. Mild foraminal stenosis. L3-L4: Disc bulge with endplate osteophytic ridging and marked facet arthropathy with ligamentum flavum infolding. Marked canal stenosis with effacement of the lateral recesses. Moderate foraminal stenosis. L4-L5: Disc bulge with endplate osteophytic ridging and moderate right and marked left facet arthropathy with ligamentum flavum infolding. Moderate to marked canal stenosis. Moderate to marked foraminal stenosis. L5-S1: Disc bulge with endplate osteophytic ridging and moderate facet arthropathy. No canal stenosis. Moderate to marked foraminal stenosis. IMPRESSION: Motion degraded study with suboptimal evaluation. Diffuse osseous metastatic disease. Mild to moderate compression deformity of T3 with mild ventral epidural extension. No cord compression. Mild compression deformity of L4 with ventral epidural extension resulting in severe canal stenosis and crowding of cauda equina. Multilevel degenerative changes, greatest at lumbar levels with significant canal stenosis at L3-L4 and L4-L5. These results were called by telephone at the time of interpretation on 06/27/2019 at 6:21 pm to provider Dr. Vanita Panda, who verbally acknowledged  these results. Electronically Signed   By: Macy Mis M.D.   On: 06/27/2019 18:22   MR THORACIC SPINE W WO CONTRAST  Result Date: 06/27/2019 CLINICAL DATA:  Back pain, history of prostate cancer, new suspected lung cancer EXAM: MRI CERVICAL, THORACIC AND LUMBAR SPINE WITHOUT AND WITH CONTRAST CONTRAST:  7 mL Gadavist TECHNIQUE: Multiplanar and multiecho pulse sequences of the cervical spine, to include the craniocervical junction and cervicothoracic junction, and thoracic and lumbar spine, were obtained without intravenous contrast. COMPARISON:  None. FINDINGS: MRI  CERVICAL SPINE Motion artifact is present Alignment: Anteroposterior alignment is maintained. Vertebrae: There is abnormal marrow signal at multiple levels with greatest involvement of C3 and C4 vertebral bodies. There no compression deformity. No significant epidural disease noted. Cord: No definite abnormal signal within the above limitation. Posterior Fossa, vertebral arteries, paraspinal tissues: Unremarkable. Disc levels: Multilevel disc bulges, endplate osteophytes, and facet and uncovertebral hypertrophy. There is no high-grade canal stenosis. Multilevel foraminal stenosis is present and poorly evaluated due to artifact. MRI THORACIC SPINE Motion artifact is present Alignment:  Anteroposterior alignment is maintained. Vertebrae: Multifocal abnormal marrow signal with greatest involvement of T2-T4 and T11 and T12. There is mild to moderate compression deformity of T3 with less than 50% loss of height. Probable mild ventral epidural extension at this level. There is some foraminal extension at T2-T3, T3-T4, and T4-T5 levels. Cord:  No abnormal signal within the above limitation. Paraspinal and other soft tissues: Intrathoracic findings are better evaluated on the prior chest CT. Disc levels: Mild degenerative changes are present without significant degenerative stenosis. MRI LUMBAR SPINE Motion artifact is present. Segmentation: Standard.  Alignment:  Mild degenerative listhesis. Vertebrae: There is multifocal abnormal marrow signal throughout the lumbosacral spine and imaged bony pelvis. There is mild compression deformity of L4 with less than 50% loss of height. Ventral epidural disease is present at this level with resulting severe canal stenosis and crowding of cauda equina. Conus medullaris and cauda equina: Conus extends to the L1 level. Paraspinal and other soft tissues: Unremarkable Disc levels: L1-L2:  Disc bulge.  No significant canal or foraminal stenosis. L2-L3: Disc bulge with endplate osteophytic ridging and facet arthropathy with ligamentum flavum infolding. Moderate canal stenosis. Mild foraminal stenosis. L3-L4: Disc bulge with endplate osteophytic ridging and marked facet arthropathy with ligamentum flavum infolding. Marked canal stenosis with effacement of the lateral recesses. Moderate foraminal stenosis. L4-L5: Disc bulge with endplate osteophytic ridging and moderate right and marked left facet arthropathy with ligamentum flavum infolding. Moderate to marked canal stenosis. Moderate to marked foraminal stenosis. L5-S1: Disc bulge with endplate osteophytic ridging and moderate facet arthropathy. No canal stenosis. Moderate to marked foraminal stenosis. IMPRESSION: Motion degraded study with suboptimal evaluation. Diffuse osseous metastatic disease. Mild to moderate compression deformity of T3 with mild ventral epidural extension. No cord compression. Mild compression deformity of L4 with ventral epidural extension resulting in severe canal stenosis and crowding of cauda equina. Multilevel degenerative changes, greatest at lumbar levels with significant canal stenosis at L3-L4 and L4-L5. These results were called by telephone at the time of interpretation on 06/27/2019 at 6:21 pm to provider Dr. Vanita Panda, who verbally acknowledged these results. Electronically Signed   By: Macy Mis M.D.   On: 06/27/2019 18:22   MR Lumbar  Spine W Wo Contrast  Result Date: 06/27/2019 CLINICAL DATA:  Back pain, history of prostate cancer, new suspected lung cancer EXAM: MRI CERVICAL, THORACIC AND LUMBAR SPINE WITHOUT AND WITH CONTRAST CONTRAST:  7 mL Gadavist TECHNIQUE: Multiplanar and multiecho pulse sequences of the cervical spine, to include the craniocervical junction and cervicothoracic junction, and thoracic and lumbar spine, were obtained without intravenous contrast. COMPARISON:  None. FINDINGS: MRI CERVICAL SPINE Motion artifact is present Alignment: Anteroposterior alignment is maintained. Vertebrae: There is abnormal marrow signal at multiple levels with greatest involvement of C3 and C4 vertebral bodies. There no compression deformity. No significant epidural disease noted. Cord: No definite abnormal signal within the above limitation. Posterior Fossa, vertebral arteries, paraspinal tissues: Unremarkable. Disc levels: Multilevel disc bulges,  endplate osteophytes, and facet and uncovertebral hypertrophy. There is no high-grade canal stenosis. Multilevel foraminal stenosis is present and poorly evaluated due to artifact. MRI THORACIC SPINE Motion artifact is present Alignment:  Anteroposterior alignment is maintained. Vertebrae: Multifocal abnormal marrow signal with greatest involvement of T2-T4 and T11 and T12. There is mild to moderate compression deformity of T3 with less than 50% loss of height. Probable mild ventral epidural extension at this level. There is some foraminal extension at T2-T3, T3-T4, and T4-T5 levels. Cord:  No abnormal signal within the above limitation. Paraspinal and other soft tissues: Intrathoracic findings are better evaluated on the prior chest CT. Disc levels: Mild degenerative changes are present without significant degenerative stenosis. MRI LUMBAR SPINE Motion artifact is present. Segmentation: Standard. Alignment:  Mild degenerative listhesis. Vertebrae: There is multifocal abnormal marrow signal throughout  the lumbosacral spine and imaged bony pelvis. There is mild compression deformity of L4 with less than 50% loss of height. Ventral epidural disease is present at this level with resulting severe canal stenosis and crowding of cauda equina. Conus medullaris and cauda equina: Conus extends to the L1 level. Paraspinal and other soft tissues: Unremarkable Disc levels: L1-L2:  Disc bulge.  No significant canal or foraminal stenosis. L2-L3: Disc bulge with endplate osteophytic ridging and facet arthropathy with ligamentum flavum infolding. Moderate canal stenosis. Mild foraminal stenosis. L3-L4: Disc bulge with endplate osteophytic ridging and marked facet arthropathy with ligamentum flavum infolding. Marked canal stenosis with effacement of the lateral recesses. Moderate foraminal stenosis. L4-L5: Disc bulge with endplate osteophytic ridging and moderate right and marked left facet arthropathy with ligamentum flavum infolding. Moderate to marked canal stenosis. Moderate to marked foraminal stenosis. L5-S1: Disc bulge with endplate osteophytic ridging and moderate facet arthropathy. No canal stenosis. Moderate to marked foraminal stenosis. IMPRESSION: Motion degraded study with suboptimal evaluation. Diffuse osseous metastatic disease. Mild to moderate compression deformity of T3 with mild ventral epidural extension. No cord compression. Mild compression deformity of L4 with ventral epidural extension resulting in severe canal stenosis and crowding of cauda equina. Multilevel degenerative changes, greatest at lumbar levels with significant canal stenosis at L3-L4 and L4-L5. These results were called by telephone at the time of interpretation on 06/27/2019 at 6:21 pm to provider Dr. Vanita Panda, who verbally acknowledged these results. Electronically Signed   By: Macy Mis M.D.   On: 06/27/2019 18:22   CT Angio Chest/Abd/Pel for Dissection W and/or Wo Contrast  Result Date: 06/27/2019 CLINICAL DATA:  79 year old male  with history of severe lower abdominal and back pain. EXAM: CT ANGIOGRAPHY CHEST, ABDOMEN AND PELVIS TECHNIQUE: Non-contrast CT of the chest was initially obtained. Multidetector CT imaging through the chest, abdomen and pelvis was performed using the standard protocol during bolus administration of intravenous contrast. Multiplanar reconstructed images and MIPs were obtained and reviewed to evaluate the vascular anatomy. CONTRAST:  123mL OMNIPAQUE IOHEXOL 350 MG/ML SOLN COMPARISON:  CT the abdomen and pelvis 04/20/2012. FINDINGS: CTA CHEST FINDINGS Cardiovascular: Precontrast images demonstrate no crescentic high attenuation associated with the wall of the thoracic aorta to suggest acute intramural hematoma. No aneurysm or dissection of the thoracic aorta. 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 Mid thoracic aortic arch measures 2.9 cm in diameter. Descending thoracic aorta measures 2.8 cm in diameter. Heart size is normal. There is no significant pericardial fluid, thickening or pericardial calcification. There is aortic atherosclerosis, as well as atherosclerosis of the great vessels of the mediastinum and the coronary arteries, including calcified atherosclerotic plaque  in the left main, left anterior descending, left circumflex and right coronary arteries. Mediastinum/Nodes: Enlarged AP window lymph node measuring 1.7 cm in short axis. Borderline enlarged left hilar lymph nodes measuring up to 1.3 cm in short axis. Small hiatal hernia. No axillary lymphadenopathy. Lungs/Pleura: Multiple pulmonary nodules are noted, largest of which is in the left upper lobe (axial image 63 of series 7 and sagittal image 141 of series 10) measuring 2.5 x 1.6 x 1.5 cm. This lesion has macrolobulated slightly ill-defined margins. Several other smaller pulmonary nodules are noted including a 7 x 4 mm left upper lobe nodule (axial image 65 of series 7), and a 1.0 x 0.5 cm nodule in the superior segment of the right  lower lobe (axial image 44 of series 7). No acute consolidative airspace disease. No pleural effusions. Musculoskeletal: There are no aggressive appearing lytic or blastic lesions noted in the visualized portions of the skeleton. Review of the MIP images confirms the above findings. CTA ABDOMEN AND PELVIS FINDINGS VASCULAR Aorta: Normal caliber aorta without aneurysm, dissection, vasculitis or significant stenosis. Extensive atherosclerotic disease throughout the abdominal aorta. Celiac: Patent without evidence of dissection, vasculitis or significant stenosis. Mild ectasia of the proximal celiac axis which measures 8 mm in diameter. SMA: Patent without evidence of aneurysm, dissection, vasculitis or significant stenosis. Renals: Both renal arteries are patent without evidence of aneurysm, dissection, vasculitis, fibromuscular dysplasia or significant stenosis. IMA: Patent without evidence of aneurysm, dissection, vasculitis or significant stenosis. Inflow: Patent without evidence of aneurysm, dissection, vasculitis or significant stenosis. Veins: No obvious venous abnormality within the limitations of this arterial phase study. Review of the MIP images confirms the above findings. NON-VASCULAR Hepatobiliary: 1.2 cm low-attenuation lesion in segment 2 of the liver, compatible with a simple cyst. No other larger more suspicious appearing pulmonary nodules or masses are noted. No intra or extrahepatic biliary ductal dilatation. Gallbladder is normal in appearance. Pancreas: No pancreatic mass. No pancreatic ductal dilatation. No pancreatic or peripancreatic fluid collections or inflammatory changes. Spleen: Unremarkable. Adrenals/Urinary Tract: Subcentimeter low-attenuation lesion in the posterior aspect of the interpolar region of the left kidney, too small to characterize, but statistically likely to represent a tiny cyst. Right kidney and bilateral adrenal glands are normal in appearance. No hydroureteronephrosis.  Urinary bladder is normal in appearance. Stomach/Bowel: Normal appearance of the stomach. No pathologic dilatation of small bowel or colon. Numerous colonic diverticulae are noted, without surrounding inflammatory changes to suggest an acute diverticulitis at this time. The appendix is not confidently identified and may be surgically absent. Regardless, there are no inflammatory changes noted adjacent to the cecum to suggest the presence of an acute appendicitis at this time. Lymphatic: No lymphadenopathy noted in the abdomen or pelvis. Reproductive: Fiducial markers in the prostate gland. Other: No significant volume of ascites.  No pneumoperitoneum. Musculoskeletal: There are no aggressive appearing lytic or blastic lesions noted in the visualized portions of the skeleton. Review of the MIP images confirms the above findings. IMPRESSION: 1. No acute findings are noted in the chest, abdomen or pelvis to account for the patient's history of abdominal pain. 2. Multiple pulmonary nodules in the lungs bilaterally, largest of which is a left upper lobe pulmonary nodule measuring 2.5 x 1.6 x 1.5 cm. This is associated with left hilar and AP window lymphadenopathy. 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 Further evaluation with PET-CT is recommended in the near future. No definite  signs of metastatic disease in the abdomen or pelvis. 3. Colonic diverticulosis without evidence of acute diverticulitis at this time. 4. Aortic atherosclerosis, in addition to left main and 3 vessel coronary artery disease. Assessment for potential risk factor modification, dietary therapy or pharmacologic therapy may be warranted, if clinically indicated. 5. 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 Recommend annual imaging followup by CTA or MRA. This recommendation follows 2010 ACCF/AHA/AATS/ACR/ASA/SCA/SCAI/SIR/STS/SVM  Guidelines for the Diagnosis and Management of Patients with Thoracic Aortic Disease. Circulation. 2010; 121: L579-J282. Aortic aneurysm NOS (ICD10-I71.9). 6. Additional incidental findings, as above. Electronically Signed   By: Vinnie Langton M.D.   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  He may benefit from palliative radiotherapy.  PLAN:Today, I talked to the patient and family about the findings and work-up thus far.  We discussed the natural history of spinal metastases and general treatment, highlighting the role of radiotherapy in the management.  We discussed the available radiation techniques, and focused on the details of logistics and delivery.  We reviewed the anticipated acute and late sequelae associated with radiation in this setting.  The patient was encouraged to ask questions that I answered to the best of my ability.  I filled out a patient counseling form during our discussion including treatment diagrams.  We retained a copy for our records.  The patient would like to proceed with radiation and will be scheduled for CT simulation today and start radiation today emergently.   I spent 60 minutes minutes reviewing his record, reviewing his films and talking with him face to face and more than 50% of that time was spent in counseling and/or coordination of care.   ------------------------------------------------   Tyler Pita, MD [facility] Director and Director of [facility]re[facility]ios[facility] Direct Dial: [phone]  Fax: [phone] [email or web address]  Skype  [email or web address]

## 2019-06-28 NOTE — Progress Notes (Signed)
Patient ID: Keith Toniann Fail., male   DOB: Dec 23, 1940, 79 y.o.   MRN: [id number] Films reviewed. Has multiple metastatic lesions throughout the spine. Unlikely to be operative candidate. 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 Recommend oncologic review, if severe back pain then would recommend lumbar bracing, tlso. Rad oncology also should be involved.

## 2019-06-28 NOTE — Consult Note (Addendum)
[place]  [phone] [phone]   MEDICAL ONCOLOGY - INITIAL CONSULTATION  Referral MD: Dr. 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  HPI: Keith Olson is a 79 year old male with past medical history significant for prostate cancer diagnosed in 2016-status post radiation by Dr. Tammi Klippel, CAD, hyperlipidemia, hypothyroidism.  The patient presented to the hospital with severe back pain with radiation of the pain to the lower extremities.  The patient also had weakness for 2 weeks prior to admission which had significantly worsened over the 3 days prior to presenting to the emergency room.  Work-up in the emergency room included a CT angiogram of the chest, abdomen, pelvis which showed multiple pulmonary nodules in the lungs bilaterally the largest which is in the left upper lobe measuring 2.5 x 1.6 x 1.5 cm which is associated with left hilar lymphadenopathy and concerning for primary bronchogenic carcinoma.  He had an MRI of the cervical, thoracic, and lumbar spine which showed diffuse osseous metastatic disease, mild to moderate compression deformity of T3 with mild ventral epidural extension, mild compression deformity of L4 with ventral epidural extension resulting in severe canal stenosis and crowding of cauda equina.  The case was discussed with neurosurgery who recommended IV steroids and transfer to [facility].  The patient has been evaluated by neurosurgery who indicated the patient is nonoperative candidate given the extensive spinal involvement and he had no weakness necessitating urgent decompression.  It was recommended for radiation oncology and medical oncology consults.  His PSA was obtained on admission and was elevated at 239.0.  His most recent PSA available to me was last performed on 12/21/2017 and it was 0.2 at that time.  Today, Keith Olson  reports ongoing back pain.  He is taking pain medications about every 2 hours.  He reports that his back pain was so severe that he really could not function well at home.  He reports poor appetite and a weight loss of about 5 to 10 pounds.  He reports having headaches all the time which started around the time that his back pain started.  He also reports dizziness and feels as though he is off balance.  He reports some difficulty urinating and has not had a bowel movement 5 days.  He reports nausea without vomiting.  He also reports night sweats.  He currently denies chest pain and shortness of breath.  No bleeding reported.  The patient is married.  He cares for his wife that has Alzheimer's disease.  He lives in [place], [place]..  He has 5 children.  Denies history of alcohol and tobacco use.  Medical oncology was asked see the patient to make recommendations regarding his widespread metastatic cancer.   Past Medical History:  Diagnosis Date  . Agent orange exposure   . Arthritis   . Coronary artery disease    BMS to mid LAD 2001, DES to proximal LAD 2017  . Enlarged prostate    XRT 2016  . Headache    Sinus headaches   . History of depression   . Hyperlipidemia   . Hypothyroidism   . Prostate cancer ([place]) 07/2014   hormonal therapy, external beam radiation therapy  . PTSD (post-traumatic stress disorder)   :  Past Surgical History:  Procedure Laterality Date  . CARDIAC CATHETERIZATION N/A 12/12/2015   Procedure: Left Heart Cath and Coronary Angiography;  Surgeon: Peter M Martinique, MD;  Location: [place] [facility];  Service: Cardiovascular;  Laterality: N/A;  . CARDIAC CATHETERIZATION N/A 12/12/2015   Procedure: Coronary Stent Intervention;  Surgeon: Peter M Martinique, MD;  Location: [place] [facility];  Service: Cardiovascular;  Laterality: N/A;  . CORONARY STENT INTERVENTION N/A 09/06/2017   Procedure: CORONARY STENT INTERVENTION;  Surgeon: Burnell Blanks, MD;  Location:  [place] [facility];  Service: Cardiovascular;  Laterality: N/A;  . HERNIA REPAIR Right   . LEFT HEART CATH AND CORONARY ANGIOGRAPHY N/A 09/02/2016   Procedure: Left Heart Cath and Coronary Angiography;  Surgeon: Leonie Man, MD;  Location: [place] [facility];  Service: Cardiovascular;  Laterality: N/A;  . LEFT HEART CATH AND CORONARY ANGIOGRAPHY N/A 09/06/2017   Procedure: LEFT HEART CATH AND CORONARY ANGIOGRAPHY;  Surgeon: Burnell Blanks, MD;  Location: [place] [facility];  Service: Cardiovascular;  Laterality: N/A;  . LEFT HEART CATH AND CORONARY ANGIOGRAPHY N/A 05/04/2019   Procedure: LEFT HEART CATH AND CORONARY ANGIOGRAPHY;  Surgeon: Martinique, Peter M, MD;  Location: [place] [facility];  Service: Cardiovascular;  Laterality: N/A;  . PROSTATE BIOPSY N/A 08/28/2014   Procedure: BIOPSY TRANSRECTAL ULTRASONIC PROSTATE (TUBP);  Surgeon: Rana Snare, MD;  Location: [facility];  Service: Urology;  Laterality: N/A;  . TRANSURETHRAL RESECTION OF PROSTATE N/A 08/28/2014   Procedure: TRANSURETHRAL RESECTION OF THE PROSTATE WITH GYRUS INSTRUMENTS;  Surgeon: Rana Snare, MD;  Location: [facility];  Service: Urology;  Laterality: N/A;  :  Current Facility-Administered Medications  Medication Dose Route Frequency Provider Last Rate Last Admin  . ALPRAZolam (XANAX) tablet 0.5 mg  0.5 mg Oral BID PRN Elgergawy, Silver Huguenin, MD      . aspirin EC tablet 81 mg  81 mg Oral Daily Elgergawy, Silver Huguenin, MD   81 mg at 06/28/19 0931  . dexamethasone (DECADRON) injection 4 mg  4 mg Intravenous Q12H Ashok Pall, MD   4 mg at 06/28/19 0930  . fentaNYL (SUBLIMAZE) injection 50 mcg  50 mcg Intravenous Q2H PRN Elgergawy, Silver Huguenin, MD   50 mcg at 06/28/19 0946  . gadobutrol (GADAVIST) 1 MMOL/ML injection 7 mL  7 mL Intravenous Once PRN Elgergawy, Silver Huguenin, MD      . heparin injection 5,000 Units  5,000 Units Subcutaneous Q8H Elgergawy, Silver Huguenin, MD   5,000 Units at 06/28/19 0559  . HYDROcodone-acetaminophen (NORCO) 10-325 MG  per tablet 1 tablet  1 tablet Oral Q6H PRN Elgergawy, Silver Huguenin, MD      . levothyroxine (SYNTHROID) tablet 75 mcg  75 mcg Oral QAC breakfast Elgergawy, Silver Huguenin, MD   75 mcg at 06/28/19 0559  . pantoprazole (PROTONIX) EC tablet 40 mg  40 mg Oral Daily Elgergawy, Silver Huguenin, MD   40 mg at 06/28/19 0930  . rosuvastatin (CRESTOR) tablet 40 mg  40 mg Oral Daily Elgergawy, Silver Huguenin, MD   40 mg at 06/28/19 0931  . tamsulosin (FLOMAX) capsule 0.4 mg  0.4 mg Oral QHS Elgergawy, Silver Huguenin, MD   0.4 mg at 06/27/19 2231     Allergies  Allergen Reactions  . Lipitor [Atorvastatin]     Muscles aches  :  Family History  Problem Relation Age of Onset  . Arthritis Mother   . Heart attack Father   :  Social History   Socioeconomic History  . Marital status: Married    Spouse name: Not on file  . Number of children: 5  . Years of education: Not on file  .  Highest education level: Not on file  Occupational History  . Occupation: retired  Tobacco Use  . Smoking status: Never Smoker  . Smokeless tobacco: Former Systems developer    Types: Chew  Substance and Sexual Activity  . Alcohol use: No  . Drug use: No  . Sexual activity: Yes  Other Topics Concern  . Not on file  Social History Narrative   Married   5 children   Social Determinants of Health   Financial Resource Strain:   . Difficulty of Paying Living Expenses:   Food Insecurity:   . Worried About Charity fundraiser in the Last Year:   . Arboriculturist in the Last Year:   Transportation Needs:   . Film/video editor (Medical):   Marland Kitchen Lack of Transportation (Non-Medical):   Physical Activity:   . Days of Exercise per Week:   . Minutes of Exercise per Session:   Stress:   . Feeling of Stress :   Social Connections:   . Frequency of Communication with Friends and Family:   . Frequency of Social Gatherings with Friends and Family:   . Attends Religious Services:   . Active Member of Clubs or Organizations:   . Attends Theatre manager Meetings:   Marland Kitchen Marital Status:   Intimate Partner Violence:   . Fear of Current or Ex-Partner:   . Emotionally Abused:   Marland Kitchen Physically Abused:   . Sexually Abused:   :  Review of Systems: A comprehensive 14 point review of systems was negative except as noted in the HPI.  Exam: Patient Vitals for the past 24 hrs:  BP Temp Temp src Pulse Resp SpO2 Height Weight  06/28/19 0933 110/66 98 F (36.7 C) Oral 68 16 92 % -- --  06/28/19 0536 126/65 98.3 F (36.8 C) Oral 65 18 93 % -- --  06/28/19 0050 (!) 144/71 98.3 F (36.8 C) Oral 66 19 90 % -- --  06/27/19 2307 (!) 151/74 -- -- (!) 59 -- 94 % -- --  06/27/19 2206 (!) 193/85 98.1 F (36.7 C) -- 62 19 97 % -- --  06/27/19 2030 140/78 -- -- -- 13 -- -- --  06/27/19 2000 (!) 144/81 -- -- 70 16 93 % -- --  06/27/19 1930 (!) 169/86 -- -- 61 14 92 % -- --  06/27/19 1900 (!) 149/68 -- -- (!) 58 16 94 % -- --  06/27/19 1830 (!) 176/79 -- -- 61 19 (!) 88 % -- --  06/27/19 1800 (!) 164/90 -- -- 60 15 95 % -- --  06/27/19 1430 (!) 163/75 -- -- (!) 56 18 97 % -- --  06/27/19 1400 (!) 173/79 -- -- (!) 50 17 97 % -- --  06/27/19 1347 -- -- -- -- -- 96 % -- --  06/27/19 1330 (!) 164/75 -- -- (!) 53 10 95 % -- --  06/27/19 1301 (!) 160/78 -- -- (!) 50 13 96 % -- --  06/27/19 1230 (!) 175/71 -- -- -- 13 -- -- --  06/27/19 1200 (!) 168/74 -- -- (!) 52 12 96 % -- --  06/27/19 1130 (!) 159/78 -- -- (!) 50 16 100 % -- --  06/27/19 1056 (!) 165/72 -- -- (!) 47 -- 100 % -- --  06/27/19 1031 (!) 204/178 97.7 F (36.5 C) Oral (!) 46 18 98 % -- --  06/27/19 1028 -- -- -- -- -- -- 5\' 8"  (1.727 m) 65.8 kg  General:  well-nourished in no acute distress.   Eyes:  no scleral icterus.   ENT:  There were no oropharyngeal lesions.   Neck was without thyromegaly.   Lymphatics:  Negative cervical, supraclavicular or axillary adenopathy.   Respiratory: lungs were clear bilaterally without wheezing or crackles.   Cardiovascular:  Regular rate and  rhythm, S1/S2, without murmur, rub or gallop.  There was no pedal edema.   GI:  abdomen was soft, flat, nontender, nondistended, without organomegaly.   Musculoskeletal:  no spinal tenderness of palpation of vertebral spine.   Skin exam was without echymosis, petichae.   Neuro exam was nonfocal. Patient was alert and oriented.  Attention was good.   Language was appropriate.  Mood was normal without depression.  Speech was not pressured.  Thought content was not tangential.     Lab Results  Component Value Date   WBC 8.4 06/27/2019   HGB 14.6 06/27/2019   HCT 43.5 06/27/2019   PLT 244 06/27/2019   GLUCOSE 137 (H) 06/27/2019   CHOL 214 (H) 05/04/2019   TRIG 129 05/04/2019   HDL 34 (L) 05/04/2019   LDLCALC 154 (H) 05/04/2019   ALT 23 06/27/2019   AST 28 06/27/2019   NA 135 06/27/2019   K 4.2 06/27/2019   CL 98 06/27/2019   CREATININE 0.61 06/27/2019   BUN 17 06/27/2019   CO2 23 06/27/2019    MR CERVICAL SPINE W WO CONTRAST  Result Date: 06/27/2019 CLINICAL DATA:  Back pain, history of prostate cancer, new suspected lung cancer EXAM: MRI CERVICAL, THORACIC AND LUMBAR SPINE WITHOUT AND WITH CONTRAST CONTRAST:  7 mL Gadavist TECHNIQUE: Multiplanar and multiecho pulse sequences of the cervical spine, to include the craniocervical junction and cervicothoracic junction, and thoracic and lumbar spine, were obtained without intravenous contrast. COMPARISON:  None. FINDINGS: MRI CERVICAL SPINE Motion artifact is present Alignment: Anteroposterior alignment is maintained. Vertebrae: There is abnormal marrow signal at multiple levels with greatest involvement of C3 and C4 vertebral bodies. There no compression deformity. No significant epidural disease noted. Cord: No definite abnormal signal within the above limitation. Posterior Fossa, vertebral arteries, paraspinal tissues: Unremarkable. Disc levels: Multilevel disc bulges, endplate osteophytes, and facet and uncovertebral hypertrophy. There is  no high-grade canal stenosis. Multilevel foraminal stenosis is present and poorly evaluated due to artifact. MRI THORACIC SPINE Motion artifact is present Alignment:  Anteroposterior alignment is maintained. Vertebrae: Multifocal abnormal marrow signal with greatest involvement of T2-T4 and T11 and T12. There is mild to moderate compression deformity of T3 with less than 50% loss of height. Probable mild ventral epidural extension at this level. There is some foraminal extension at T2-T3, T3-T4, and T4-T5 levels. Cord:  No abnormal signal within the above limitation. Paraspinal and other soft tissues: Intrathoracic findings are better evaluated on the prior chest CT. Disc levels: Mild degenerative changes are present without significant degenerative stenosis. MRI LUMBAR SPINE Motion artifact is present. Segmentation: Standard. Alignment:  Mild degenerative listhesis. Vertebrae: There is multifocal abnormal marrow signal throughout the lumbosacral spine and imaged bony pelvis. There is mild compression deformity of L4 with less than 50% loss of height. Ventral epidural disease is present at this level with resulting severe canal stenosis and crowding of cauda equina. Conus medullaris and cauda equina: Conus extends to the L1 level. Paraspinal and other soft tissues: Unremarkable Disc levels: L1-L2:  Disc bulge.  No significant canal or foraminal stenosis. L2-L3: Disc bulge with endplate osteophytic ridging and facet arthropathy with ligamentum flavum infolding.  Moderate canal stenosis. Mild foraminal stenosis. L3-L4: Disc bulge with endplate osteophytic ridging and marked facet arthropathy with ligamentum flavum infolding. Marked canal stenosis with effacement of the lateral recesses. Moderate foraminal stenosis. L4-L5: Disc bulge with endplate osteophytic ridging and moderate right and marked left facet arthropathy with ligamentum flavum infolding. Moderate to marked canal stenosis. Moderate to marked foraminal  stenosis. L5-S1: Disc bulge with endplate osteophytic ridging and moderate facet arthropathy. No canal stenosis. Moderate to marked foraminal stenosis. IMPRESSION: Motion degraded study with suboptimal evaluation. Diffuse osseous metastatic disease. Mild to moderate compression deformity of T3 with mild ventral epidural extension. No cord compression. Mild compression deformity of L4 with ventral epidural extension resulting in severe canal stenosis and crowding of cauda equina. Multilevel degenerative changes, greatest at lumbar levels with significant canal stenosis at L3-L4 and L4-L5. These results were called by telephone at the time of interpretation on 06/27/2019 at 6:21 pm to provider Dr. Vanita Panda, who verbally acknowledged these results. Electronically Signed   By: Macy Mis M.D.   On: 06/27/2019 18:22   MR THORACIC SPINE W WO CONTRAST  Result Date: 06/27/2019 CLINICAL DATA:  Back pain, history of prostate cancer, new suspected lung cancer EXAM: MRI CERVICAL, THORACIC AND LUMBAR SPINE WITHOUT AND WITH CONTRAST CONTRAST:  7 mL Gadavist TECHNIQUE: Multiplanar and multiecho pulse sequences of the cervical spine, to include the craniocervical junction and cervicothoracic junction, and thoracic and lumbar spine, were obtained without intravenous contrast. COMPARISON:  None. FINDINGS: MRI CERVICAL SPINE Motion artifact is present Alignment: Anteroposterior alignment is maintained. Vertebrae: There is abnormal marrow signal at multiple levels with greatest involvement of C3 and C4 vertebral bodies. There no compression deformity. No significant epidural disease noted. Cord: No definite abnormal signal within the above limitation. Posterior Fossa, vertebral arteries, paraspinal tissues: Unremarkable. Disc levels: Multilevel disc bulges, endplate osteophytes, and facet and uncovertebral hypertrophy. There is no high-grade canal stenosis. Multilevel foraminal stenosis is present and poorly evaluated due to  artifact. MRI THORACIC SPINE Motion artifact is present Alignment:  Anteroposterior alignment is maintained. Vertebrae: Multifocal abnormal marrow signal with greatest involvement of T2-T4 and T11 and T12. There is mild to moderate compression deformity of T3 with less than 50% loss of height. Probable mild ventral epidural extension at this level. There is some foraminal extension at T2-T3, T3-T4, and T4-T5 levels. Cord:  No abnormal signal within the above limitation. Paraspinal and other soft tissues: Intrathoracic findings are better evaluated on the prior chest CT. Disc levels: Mild degenerative changes are present without significant degenerative stenosis. MRI LUMBAR SPINE Motion artifact is present. Segmentation: Standard. Alignment:  Mild degenerative listhesis. Vertebrae: There is multifocal abnormal marrow signal throughout the lumbosacral spine and imaged bony pelvis. There is mild compression deformity of L4 with less than 50% loss of height. Ventral epidural disease is present at this level with resulting severe canal stenosis and crowding of cauda equina. Conus medullaris and cauda equina: Conus extends to the L1 level. Paraspinal and other soft tissues: Unremarkable Disc levels: L1-L2:  Disc bulge.  No significant canal or foraminal stenosis. L2-L3: Disc bulge with endplate osteophytic ridging and facet arthropathy with ligamentum flavum infolding. Moderate canal stenosis. Mild foraminal stenosis. L3-L4: Disc bulge with endplate osteophytic ridging and marked facet arthropathy with ligamentum flavum infolding. Marked canal stenosis with effacement of the lateral recesses. Moderate foraminal stenosis. L4-L5: Disc bulge with endplate osteophytic ridging and moderate right and marked left facet arthropathy with ligamentum flavum infolding. Moderate to marked canal stenosis. Moderate to  marked foraminal stenosis. L5-S1: Disc bulge with endplate osteophytic ridging and moderate facet arthropathy. No canal  stenosis. Moderate to marked foraminal stenosis. IMPRESSION: Motion degraded study with suboptimal evaluation. Diffuse osseous metastatic disease. Mild to moderate compression deformity of T3 with mild ventral epidural extension. No cord compression. Mild compression deformity of L4 with ventral epidural extension resulting in severe canal stenosis and crowding of cauda equina. Multilevel degenerative changes, greatest at lumbar levels with significant canal stenosis at L3-L4 and L4-L5. These results were called by telephone at the time of interpretation on 06/27/2019 at 6:21 pm to provider Dr. Vanita Panda, who verbally acknowledged these results. Electronically Signed   By: Macy Mis M.D.   On: 06/27/2019 18:22   MR Lumbar Spine W Wo Contrast  Result Date: 06/27/2019 CLINICAL DATA:  Back pain, history of prostate cancer, new suspected lung cancer EXAM: MRI CERVICAL, THORACIC AND LUMBAR SPINE WITHOUT AND WITH CONTRAST CONTRAST:  7 mL Gadavist TECHNIQUE: Multiplanar and multiecho pulse sequences of the cervical spine, to include the craniocervical junction and cervicothoracic junction, and thoracic and lumbar spine, were obtained without intravenous contrast. COMPARISON:  None. FINDINGS: MRI CERVICAL SPINE Motion artifact is present Alignment: Anteroposterior alignment is maintained. Vertebrae: There is abnormal marrow signal at multiple levels with greatest involvement of C3 and C4 vertebral bodies. There no compression deformity. No significant epidural disease noted. Cord: No definite abnormal signal within the above limitation. Posterior Fossa, vertebral arteries, paraspinal tissues: Unremarkable. Disc levels: Multilevel disc bulges, endplate osteophytes, and facet and uncovertebral hypertrophy. There is no high-grade canal stenosis. Multilevel foraminal stenosis is present and poorly evaluated due to artifact. MRI THORACIC SPINE Motion artifact is present Alignment:  Anteroposterior alignment is maintained.  Vertebrae: Multifocal abnormal marrow signal with greatest involvement of T2-T4 and T11 and T12. There is mild to moderate compression deformity of T3 with less than 50% loss of height. Probable mild ventral epidural extension at this level. There is some foraminal extension at T2-T3, T3-T4, and T4-T5 levels. Cord:  No abnormal signal within the above limitation. Paraspinal and other soft tissues: Intrathoracic findings are better evaluated on the prior chest CT. Disc levels: Mild degenerative changes are present without significant degenerative stenosis. MRI LUMBAR SPINE Motion artifact is present. Segmentation: Standard. Alignment:  Mild degenerative listhesis. Vertebrae: There is multifocal abnormal marrow signal throughout the lumbosacral spine and imaged bony pelvis. There is mild compression deformity of L4 with less than 50% loss of height. Ventral epidural disease is present at this level with resulting severe canal stenosis and crowding of cauda equina. Conus medullaris and cauda equina: Conus extends to the L1 level. Paraspinal and other soft tissues: Unremarkable Disc levels: L1-L2:  Disc bulge.  No significant canal or foraminal stenosis. L2-L3: Disc bulge with endplate osteophytic ridging and facet arthropathy with ligamentum flavum infolding. Moderate canal stenosis. Mild foraminal stenosis. L3-L4: Disc bulge with endplate osteophytic ridging and marked facet arthropathy with ligamentum flavum infolding. Marked canal stenosis with effacement of the lateral recesses. Moderate foraminal stenosis. L4-L5: Disc bulge with endplate osteophytic ridging and moderate right and marked left facet arthropathy with ligamentum flavum infolding. Moderate to marked canal stenosis. Moderate to marked foraminal stenosis. L5-S1: Disc bulge with endplate osteophytic ridging and moderate facet arthropathy. No canal stenosis. Moderate to marked foraminal stenosis. IMPRESSION: Motion degraded study with suboptimal  evaluation. Diffuse osseous metastatic disease. Mild to moderate compression deformity of T3 with mild ventral epidural extension. No cord compression. Mild compression deformity of L4 with ventral epidural extension resulting  in severe canal stenosis and crowding of cauda equina. Multilevel degenerative changes, greatest at lumbar levels with significant canal stenosis at L3-L4 and L4-L5. These results were called by telephone at the time of interpretation on 06/27/2019 at 6:21 pm to provider Dr. Vanita Panda, who verbally acknowledged these results. Electronically Signed   By: Macy Mis M.D.   On: 06/27/2019 18:22   CT Angio Chest/Abd/Pel for Dissection W and/or Wo Contrast  Result Date: 06/27/2019 CLINICAL DATA:  79 year old male with history of severe lower abdominal and back pain. EXAM: CT ANGIOGRAPHY CHEST, ABDOMEN AND PELVIS TECHNIQUE: Non-contrast CT of the chest was initially obtained. Multidetector CT imaging through the chest, abdomen and pelvis was performed using the standard protocol during bolus administration of intravenous contrast. Multiplanar reconstructed images and MIPs were obtained and reviewed to evaluate the vascular anatomy. CONTRAST:  152mL OMNIPAQUE IOHEXOL 350 MG/ML SOLN COMPARISON:  CT the abdomen and pelvis 04/20/2012. FINDINGS: CTA CHEST FINDINGS Cardiovascular: Precontrast images demonstrate no crescentic high attenuation associated with the wall of the thoracic aorta to suggest acute intramural hematoma. No aneurysm or dissection of the thoracic aorta. 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 Mid thoracic aortic arch measures 2.9 cm in diameter. Descending thoracic aorta measures 2.8 cm in diameter. Heart size is normal. There is no significant pericardial fluid, thickening or pericardial calcification. There is aortic atherosclerosis, as well as atherosclerosis of the great vessels of the mediastinum and the coronary arteries, including calcified  atherosclerotic plaque in the left main, left anterior descending, left circumflex and right coronary arteries. Mediastinum/Nodes: Enlarged AP window lymph node measuring 1.7 cm in short axis. Borderline enlarged left hilar lymph nodes measuring up to 1.3 cm in short axis. Small hiatal hernia. No axillary lymphadenopathy. Lungs/Pleura: Multiple pulmonary nodules are noted, largest of which is in the left upper lobe (axial image 63 of series 7 and sagittal image 141 of series 10) measuring 2.5 x 1.6 x 1.5 cm. This lesion has macrolobulated slightly ill-defined margins. Several other smaller pulmonary nodules are noted including a 7 x 4 mm left upper lobe nodule (axial image 65 of series 7), and a 1.0 x 0.5 cm nodule in the superior segment of the right lower lobe (axial image 44 of series 7). No acute consolidative airspace disease. No pleural effusions. Musculoskeletal: There are no aggressive appearing lytic or blastic lesions noted in the visualized portions of the skeleton. Review of the MIP images confirms the above findings. CTA ABDOMEN AND PELVIS FINDINGS VASCULAR Aorta: Normal caliber aorta without aneurysm, dissection, vasculitis or significant stenosis. Extensive atherosclerotic disease throughout the abdominal aorta. Celiac: Patent without evidence of dissection, vasculitis or significant stenosis. Mild ectasia of the proximal celiac axis which measures 8 mm in diameter. SMA: Patent without evidence of aneurysm, dissection, vasculitis or significant stenosis. Renals: Both renal arteries are patent without evidence of aneurysm, dissection, vasculitis, fibromuscular dysplasia or significant stenosis. IMA: Patent without evidence of aneurysm, dissection, vasculitis or significant stenosis. Inflow: Patent without evidence of aneurysm, dissection, vasculitis or significant stenosis. Veins: No obvious venous abnormality within the limitations of this arterial phase study. Review of the MIP images confirms the  above findings. NON-VASCULAR Hepatobiliary: 1.2 cm low-attenuation lesion in segment 2 of the liver, compatible with a simple cyst. No other larger more suspicious appearing pulmonary nodules or masses are noted. No intra or extrahepatic biliary ductal dilatation. Gallbladder is normal in appearance. Pancreas: No pancreatic mass. No pancreatic ductal dilatation. No pancreatic or peripancreatic fluid collections or inflammatory  changes. Spleen: Unremarkable. Adrenals/Urinary Tract: Subcentimeter low-attenuation lesion in the posterior aspect of the interpolar region of the left kidney, too small to characterize, but statistically likely to represent a tiny cyst. Right kidney and bilateral adrenal glands are normal in appearance. No hydroureteronephrosis. Urinary bladder is normal in appearance. Stomach/Bowel: Normal appearance of the stomach. No pathologic dilatation of small bowel or colon. Numerous colonic diverticulae are noted, without surrounding inflammatory changes to suggest an acute diverticulitis at this time. The appendix is not confidently identified and may be surgically absent. Regardless, there are no inflammatory changes noted adjacent to the cecum to suggest the presence of an acute appendicitis at this time. Lymphatic: No lymphadenopathy noted in the abdomen or pelvis. Reproductive: Fiducial markers in the prostate gland. Other: No significant volume of ascites.  No pneumoperitoneum. Musculoskeletal: There are no aggressive appearing lytic or blastic lesions noted in the visualized portions of the skeleton. Review of the MIP images confirms the above findings. IMPRESSION: 1. No acute findings are noted in the chest, abdomen or pelvis to account for the patient's history of abdominal pain. 2. Multiple pulmonary nodules in the lungs bilaterally, largest of which is a left upper lobe pulmonary nodule measuring 2.5 x 1.6 x 1.5 cm. This is associated with left hilar and AP window lymphadenopathy.  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 Further evaluation with PET-CT is recommended in the near future. No definite signs of metastatic disease in the abdomen or pelvis. 3. Colonic diverticulosis without evidence of acute diverticulitis at this time. 4. Aortic atherosclerosis, in addition to left main and 3 vessel coronary artery disease. Assessment for potential risk factor modification, dietary therapy or pharmacologic therapy may be warranted, if clinically indicated. 5. 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 Recommend annual imaging followup by CTA or MRA. This recommendation follows 2010 ACCF/AHA/AATS/ACR/ASA/SCA/SCAI/SIR/STS/SVM Guidelines for the Diagnosis and Management of Patients with Thoracic Aortic Disease. Circulation. 2010; 121: S505-L976. Aortic aneurysm NOS (ICD10-I71.9). 6. Additional incidental findings, as above. Electronically Signed   By: Vinnie Langton M.D.   On: 06/27/2019 12:26     MR CERVICAL SPINE W WO CONTRAST  Result Date: 06/27/2019 CLINICAL DATA:  Back pain, history of prostate cancer, new suspected lung cancer EXAM: MRI CERVICAL, THORACIC AND LUMBAR SPINE WITHOUT AND WITH CONTRAST CONTRAST:  7 mL Gadavist TECHNIQUE: Multiplanar and multiecho pulse sequences of the cervical spine, to include the craniocervical junction and cervicothoracic junction, and thoracic and lumbar spine, were obtained without intravenous contrast. COMPARISON:  None. FINDINGS: MRI CERVICAL SPINE Motion artifact is present Alignment: Anteroposterior alignment is maintained. Vertebrae: There is abnormal marrow signal at multiple levels with greatest involvement of C3 and C4 vertebral bodies. There no compression deformity. No significant epidural disease noted. Cord: No definite abnormal signal within the above limitation. Posterior Fossa, vertebral arteries, paraspinal tissues:  Unremarkable. Disc levels: Multilevel disc bulges, endplate osteophytes, and facet and uncovertebral hypertrophy. There is no high-grade canal stenosis. Multilevel foraminal stenosis is present and poorly evaluated due to artifact. MRI THORACIC SPINE Motion artifact is present Alignment:  Anteroposterior alignment is maintained. Vertebrae: Multifocal abnormal marrow signal with greatest involvement of T2-T4 and T11 and T12. There is mild to moderate compression deformity of T3 with less than 50% loss of height. Probable mild ventral epidural extension at this level. There is some foraminal extension at T2-T3, T3-T4, and T4-T5 levels. Cord:  No abnormal signal within the  above limitation. Paraspinal and other soft tissues: Intrathoracic findings are better evaluated on the prior chest CT. Disc levels: Mild degenerative changes are present without significant degenerative stenosis. MRI LUMBAR SPINE Motion artifact is present. Segmentation: Standard. Alignment:  Mild degenerative listhesis. Vertebrae: There is multifocal abnormal marrow signal throughout the lumbosacral spine and imaged bony pelvis. There is mild compression deformity of L4 with less than 50% loss of height. Ventral epidural disease is present at this level with resulting severe canal stenosis and crowding of cauda equina. Conus medullaris and cauda equina: Conus extends to the L1 level. Paraspinal and other soft tissues: Unremarkable Disc levels: L1-L2:  Disc bulge.  No significant canal or foraminal stenosis. L2-L3: Disc bulge with endplate osteophytic ridging and facet arthropathy with ligamentum flavum infolding. Moderate canal stenosis. Mild foraminal stenosis. L3-L4: Disc bulge with endplate osteophytic ridging and marked facet arthropathy with ligamentum flavum infolding. Marked canal stenosis with effacement of the lateral recesses. Moderate foraminal stenosis. L4-L5: Disc bulge with endplate osteophytic ridging and moderate right and marked  left facet arthropathy with ligamentum flavum infolding. Moderate to marked canal stenosis. Moderate to marked foraminal stenosis. L5-S1: Disc bulge with endplate osteophytic ridging and moderate facet arthropathy. No canal stenosis. Moderate to marked foraminal stenosis. IMPRESSION: Motion degraded study with suboptimal evaluation. Diffuse osseous metastatic disease. Mild to moderate compression deformity of T3 with mild ventral epidural extension. No cord compression. Mild compression deformity of L4 with ventral epidural extension resulting in severe canal stenosis and crowding of cauda equina. Multilevel degenerative changes, greatest at lumbar levels with significant canal stenosis at L3-L4 and L4-L5. These results were called by telephone at the time of interpretation on 06/27/2019 at 6:21 pm to provider Dr. Vanita Panda, who verbally acknowledged these results. Electronically Signed   By: Macy Mis M.D.   On: 06/27/2019 18:22   MR THORACIC SPINE W WO CONTRAST  Result Date: 06/27/2019 CLINICAL DATA:  Back pain, history of prostate cancer, new suspected lung cancer EXAM: MRI CERVICAL, THORACIC AND LUMBAR SPINE WITHOUT AND WITH CONTRAST CONTRAST:  7 mL Gadavist TECHNIQUE: Multiplanar and multiecho pulse sequences of the cervical spine, to include the craniocervical junction and cervicothoracic junction, and thoracic and lumbar spine, were obtained without intravenous contrast. COMPARISON:  None. FINDINGS: MRI CERVICAL SPINE Motion artifact is present Alignment: Anteroposterior alignment is maintained. Vertebrae: There is abnormal marrow signal at multiple levels with greatest involvement of C3 and C4 vertebral bodies. There no compression deformity. No significant epidural disease noted. Cord: No definite abnormal signal within the above limitation. Posterior Fossa, vertebral arteries, paraspinal tissues: Unremarkable. Disc levels: Multilevel disc bulges, endplate osteophytes, and facet and uncovertebral  hypertrophy. There is no high-grade canal stenosis. Multilevel foraminal stenosis is present and poorly evaluated due to artifact. MRI THORACIC SPINE Motion artifact is present Alignment:  Anteroposterior alignment is maintained. Vertebrae: Multifocal abnormal marrow signal with greatest involvement of T2-T4 and T11 and T12. There is mild to moderate compression deformity of T3 with less than 50% loss of height. Probable mild ventral epidural extension at this level. There is some foraminal extension at T2-T3, T3-T4, and T4-T5 levels. Cord:  No abnormal signal within the above limitation. Paraspinal and other soft tissues: Intrathoracic findings are better evaluated on the prior chest CT. Disc levels: Mild degenerative changes are present without significant degenerative stenosis. MRI LUMBAR SPINE Motion artifact is present. Segmentation: Standard. Alignment:  Mild degenerative listhesis. Vertebrae: There is multifocal abnormal marrow signal throughout the lumbosacral spine and imaged bony pelvis. There is  mild compression deformity of L4 with less than 50% loss of height. Ventral epidural disease is present at this level with resulting severe canal stenosis and crowding of cauda equina. Conus medullaris and cauda equina: Conus extends to the L1 level. Paraspinal and other soft tissues: Unremarkable Disc levels: L1-L2:  Disc bulge.  No significant canal or foraminal stenosis. L2-L3: Disc bulge with endplate osteophytic ridging and facet arthropathy with ligamentum flavum infolding. Moderate canal stenosis. Mild foraminal stenosis. L3-L4: Disc bulge with endplate osteophytic ridging and marked facet arthropathy with ligamentum flavum infolding. Marked canal stenosis with effacement of the lateral recesses. Moderate foraminal stenosis. L4-L5: Disc bulge with endplate osteophytic ridging and moderate right and marked left facet arthropathy with ligamentum flavum infolding. Moderate to marked canal stenosis. Moderate to  marked foraminal stenosis. L5-S1: Disc bulge with endplate osteophytic ridging and moderate facet arthropathy. No canal stenosis. Moderate to marked foraminal stenosis. IMPRESSION: Motion degraded study with suboptimal evaluation. Diffuse osseous metastatic disease. Mild to moderate compression deformity of T3 with mild ventral epidural extension. No cord compression. Mild compression deformity of L4 with ventral epidural extension resulting in severe canal stenosis and crowding of cauda equina. Multilevel degenerative changes, greatest at lumbar levels with significant canal stenosis at L3-L4 and L4-L5. These results were called by telephone at the time of interpretation on 06/27/2019 at 6:21 pm to provider Dr. Vanita Panda, who verbally acknowledged these results. Electronically Signed   By: Macy Mis M.D.   On: 06/27/2019 18:22   MR Lumbar Spine W Wo Contrast  Result Date: 06/27/2019 CLINICAL DATA:  Back pain, history of prostate cancer, new suspected lung cancer EXAM: MRI CERVICAL, THORACIC AND LUMBAR SPINE WITHOUT AND WITH CONTRAST CONTRAST:  7 mL Gadavist TECHNIQUE: Multiplanar and multiecho pulse sequences of the cervical spine, to include the craniocervical junction and cervicothoracic junction, and thoracic and lumbar spine, were obtained without intravenous contrast. COMPARISON:  None. FINDINGS: MRI CERVICAL SPINE Motion artifact is present Alignment: Anteroposterior alignment is maintained. Vertebrae: There is abnormal marrow signal at multiple levels with greatest involvement of C3 and C4 vertebral bodies. There no compression deformity. No significant epidural disease noted. Cord: No definite abnormal signal within the above limitation. Posterior Fossa, vertebral arteries, paraspinal tissues: Unremarkable. Disc levels: Multilevel disc bulges, endplate osteophytes, and facet and uncovertebral hypertrophy. There is no high-grade canal stenosis. Multilevel foraminal stenosis is present and poorly  evaluated due to artifact. MRI THORACIC SPINE Motion artifact is present Alignment:  Anteroposterior alignment is maintained. Vertebrae: Multifocal abnormal marrow signal with greatest involvement of T2-T4 and T11 and T12. There is mild to moderate compression deformity of T3 with less than 50% loss of height. Probable mild ventral epidural extension at this level. There is some foraminal extension at T2-T3, T3-T4, and T4-T5 levels. Cord:  No abnormal signal within the above limitation. Paraspinal and other soft tissues: Intrathoracic findings are better evaluated on the prior chest CT. Disc levels: Mild degenerative changes are present without significant degenerative stenosis. MRI LUMBAR SPINE Motion artifact is present. Segmentation: Standard. Alignment:  Mild degenerative listhesis. Vertebrae: There is multifocal abnormal marrow signal throughout the lumbosacral spine and imaged bony pelvis. There is mild compression deformity of L4 with less than 50% loss of height. Ventral epidural disease is present at this level with resulting severe canal stenosis and crowding of cauda equina. Conus medullaris and cauda equina: Conus extends to the L1 level. Paraspinal and other soft tissues: Unremarkable Disc levels: L1-L2:  Disc bulge.  No significant canal or foraminal  stenosis. L2-L3: Disc bulge with endplate osteophytic ridging and facet arthropathy with ligamentum flavum infolding. Moderate canal stenosis. Mild foraminal stenosis. L3-L4: Disc bulge with endplate osteophytic ridging and marked facet arthropathy with ligamentum flavum infolding. Marked canal stenosis with effacement of the lateral recesses. Moderate foraminal stenosis. L4-L5: Disc bulge with endplate osteophytic ridging and moderate right and marked left facet arthropathy with ligamentum flavum infolding. Moderate to marked canal stenosis. Moderate to marked foraminal stenosis. L5-S1: Disc bulge with endplate osteophytic ridging and moderate facet  arthropathy. No canal stenosis. Moderate to marked foraminal stenosis. IMPRESSION: Motion degraded study with suboptimal evaluation. Diffuse osseous metastatic disease. Mild to moderate compression deformity of T3 with mild ventral epidural extension. No cord compression. Mild compression deformity of L4 with ventral epidural extension resulting in severe canal stenosis and crowding of cauda equina. Multilevel degenerative changes, greatest at lumbar levels with significant canal stenosis at L3-L4 and L4-L5. These results were called by telephone at the time of interpretation on 06/27/2019 at 6:21 pm to provider Dr. Vanita Panda, who verbally acknowledged these results. Electronically Signed   By: Macy Mis M.D.   On: 06/27/2019 18:22   CT Angio Chest/Abd/Pel for Dissection W and/or Wo Contrast  Result Date: 06/27/2019 CLINICAL DATA:  79 year old male with history of severe lower abdominal and back pain. EXAM: CT ANGIOGRAPHY CHEST, ABDOMEN AND PELVIS TECHNIQUE: Non-contrast CT of the chest was initially obtained. Multidetector CT imaging through the chest, abdomen and pelvis was performed using the standard protocol during bolus administration of intravenous contrast. Multiplanar reconstructed images and MIPs were obtained and reviewed to evaluate the vascular anatomy. CONTRAST:  18mL OMNIPAQUE IOHEXOL 350 MG/ML SOLN COMPARISON:  CT the abdomen and pelvis 04/20/2012. FINDINGS: CTA CHEST FINDINGS Cardiovascular: Precontrast images demonstrate no crescentic high attenuation associated with the wall of the thoracic aorta to suggest acute intramural hematoma. No aneurysm or dissection of the thoracic aorta. 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 Mid thoracic aortic arch measures 2.9 cm in diameter. Descending thoracic aorta measures 2.8 cm in diameter. Heart size is normal. There is no significant pericardial fluid, thickening or pericardial calcification. There is aortic atherosclerosis, as well  as atherosclerosis of the great vessels of the mediastinum and the coronary arteries, including calcified atherosclerotic plaque in the left main, left anterior descending, left circumflex and right coronary arteries. Mediastinum/Nodes: Enlarged AP window lymph node measuring 1.7 cm in short axis. Borderline enlarged left hilar lymph nodes measuring up to 1.3 cm in short axis. Small hiatal hernia. No axillary lymphadenopathy. Lungs/Pleura: Multiple pulmonary nodules are noted, largest of which is in the left upper lobe (axial image 63 of series 7 and sagittal image 141 of series 10) measuring 2.5 x 1.6 x 1.5 cm. This lesion has macrolobulated slightly ill-defined margins. Several other smaller pulmonary nodules are noted including a 7 x 4 mm left upper lobe nodule (axial image 65 of series 7), and a 1.0 x 0.5 cm nodule in the superior segment of the right lower lobe (axial image 44 of series 7). No acute consolidative airspace disease. No pleural effusions. Musculoskeletal: There are no aggressive appearing lytic or blastic lesions noted in the visualized portions of the skeleton. Review of the MIP images confirms the above findings. CTA ABDOMEN AND PELVIS FINDINGS VASCULAR Aorta: Normal caliber aorta without aneurysm, dissection, vasculitis or significant stenosis. Extensive atherosclerotic disease throughout the abdominal aorta. Celiac: Patent without evidence of dissection, vasculitis or significant stenosis. Mild ectasia of the proximal celiac axis which measures 8  mm in diameter. SMA: Patent without evidence of aneurysm, dissection, vasculitis or significant stenosis. Renals: Both renal arteries are patent without evidence of aneurysm, dissection, vasculitis, fibromuscular dysplasia or significant stenosis. IMA: Patent without evidence of aneurysm, dissection, vasculitis or significant stenosis. Inflow: Patent without evidence of aneurysm, dissection, vasculitis or significant stenosis. Veins: No obvious venous  abnormality within the limitations of this arterial phase study. Review of the MIP images confirms the above findings. NON-VASCULAR Hepatobiliary: 1.2 cm low-attenuation lesion in segment 2 of the liver, compatible with a simple cyst. No other larger more suspicious appearing pulmonary nodules or masses are noted. No intra or extrahepatic biliary ductal dilatation. Gallbladder is normal in appearance. Pancreas: No pancreatic mass. No pancreatic ductal dilatation. No pancreatic or peripancreatic fluid collections or inflammatory changes. Spleen: Unremarkable. Adrenals/Urinary Tract: Subcentimeter low-attenuation lesion in the posterior aspect of the interpolar region of the left kidney, too small to characterize, but statistically likely to represent a tiny cyst. Right kidney and bilateral adrenal glands are normal in appearance. No hydroureteronephrosis. Urinary bladder is normal in appearance. Stomach/Bowel: Normal appearance of the stomach. No pathologic dilatation of small bowel or colon. Numerous colonic diverticulae are noted, without surrounding inflammatory changes to suggest an acute diverticulitis at this time. The appendix is not confidently identified and may be surgically absent. Regardless, there are no inflammatory changes noted adjacent to the cecum to suggest the presence of an acute appendicitis at this time. Lymphatic: No lymphadenopathy noted in the abdomen or pelvis. Reproductive: Fiducial markers in the prostate gland. Other: No significant volume of ascites.  No pneumoperitoneum. Musculoskeletal: There are no aggressive appearing lytic or blastic lesions noted in the visualized portions of the skeleton. Review of the MIP images confirms the above findings. IMPRESSION: 1. No acute findings are noted in the chest, abdomen or pelvis to account for the patient's history of abdominal pain. 2. Multiple pulmonary nodules in the lungs bilaterally, largest of which is a left upper lobe pulmonary nodule  measuring 2.5 x 1.6 x 1.5 cm. This is associated with left hilar and AP window lymphadenopathy. 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 Further evaluation with PET-CT is recommended in the near future. No definite signs of metastatic disease in the abdomen or pelvis. 3. Colonic diverticulosis without evidence of acute diverticulitis at this time. 4. Aortic atherosclerosis, in addition to left main and 3 vessel coronary artery disease. Assessment for potential risk factor modification, dietary therapy or pharmacologic therapy may be warranted, if clinically indicated. 5. 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 Recommend annual imaging followup by CTA or MRA. This recommendation follows 2010 ACCF/AHA/AATS/ACR/ASA/SCA/SCAI/SIR/STS/SVM Guidelines for the Diagnosis and Management of Patients with Thoracic Aortic Disease. Circulation. 2010; 121: J825-K539. Aortic aneurysm NOS (ICD10-I71.9). 6. Additional incidental findings, as above. Electronically Signed   By: Vinnie Langton M.D.   On: 06/27/2019 12:26   Assessment and Plan:   Widespread metastatic cancer - ?  Prostate versus new lung cancer -Findings on CT scans, MRI, and PSA level were discussed with the patient. -We discussed that findings likely represent recurrent prostate cancer or possibly a new lung cancer. -Recommend continuation of dexamethasone. -Discussed with Dr. Tammi Klippel of radiation oncology who plans for simulation and initiation of treatment today. -We will obtain testosterone level and start bicalutamide 50 mg daily tomorrow AM. Plan for bicalutamide 50 mg daily x 28 days with lupron shots to start at approximately 14  days into treatment (likely to be administered in outpatient setting).  -Recommend pulmonology consult to determine if lung nodule and hilar adenopathy is amenable to bronchoscopy with biopsy. -Further  recommendations per Dr. Lorenso Courier when seen later today.  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 -Palliative radiation to L4 Per radiation oncology.  History of CAD -Recent cardiac cath with stent placement. -remains on aspirin and statin.  Hyperlipidemia -Continue statin  BPH -Continue Flomax  Hypothyroidism -Continue Synthroid  Thank you for this referral.   Mikey Bussing, DNP, AGPCNP-BC, AOCNP   I have read the above note and examined in the patient. I agree with the above plan.  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 On 12/21/2017 his PSA was noted to be 0.2. On 06/27/2019 he presented to [facility] ED with intractable back pain which had been present x 3 months and worsened over the last month. MRI of the spine was performed which showed diffuse osseus metastatic disease with several canal stensis and crowding of the cauda equina. Due to the need for Neurosurgery and radiation oncology evaluation he was transferred to [facility] for further evaluation and management. Initial labs showed a PSA 239. A CT C/A/P revealed numerous pulmonary nodules with the largest being a 2.5 x 1.6 x 1.5 cm lesion in the left upper lobe.   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 At this time I favor metastatic prostate as his sole malignancy.  --Recommend consultation with pulmonary service for consideration of biopsy of lung mass. If findings show prostate cancer we can safely assume  his metastatic lesions are prostate. If lung primary, patient will require further workup/evaluation to determine which is causing his bone lesions.  --will begin androgen deprivation therapy ASAP. Start with bicalutamide 50 mg PO daily. After 14 days of treatment can start Lupron therapy. Upfront bicalutamide serves to prevent painful flare of bone lesions with Fontenelle agonists.  --patient notes he would want his cancer care administered at [facility] in the outpatient setting. We will set him up with an appointment at time of d/c. If he were to change his mind we would be happy to assume his care here. --appreciate assistance of Rad/Onc in management of compressing spinal lesion --Oncology will continue to follow closely while patient is in house.   Please do not hesitate to call or page with any questions or concerns regarding the care of this patient.  Ledell Peoples, MD Department of Hematology/Oncology [place] [facility] Phone: [phone] Pager: [phone] Email: [email or web address]

## 2019-06-28 NOTE — Progress Notes (Signed)
[facility]  PROGRESS NOTE  Keith Olson. QHU:[id number] DOB: Aug 03, 1940 DOA: 06/27/2019 PCP: Dettinger, Fransisca Kaufmann, MD Admit date - 06/27/2019   Admitting Physician Albertine Patricia, MD  Outpatient Primary MD for the patient is Dettinger, Fransisca Kaufmann, MD  LOS - 1 Brief Narrative   Keith Olson. is a 79 y.o. year old male with medical history significant for history of prostate cancer status post radiation therapy (2016) who presented on 06/27/2019 with ongoing lower back and pelvic area pain for 6 months that acutely worsened over the past 2 weeks.  Was previously treated by his PCP with Vicodin with no improvement in pain and recommended to go to ED.  Patient presented to [facility], CT of chest abdomen pelvis was negative for PE new mass concerning for bronchogenic carcinoma lymph node involvement.  MRI cervical/thoracic/lumbar spine showed extensive metastatic osseous lesions with T3 fracture but no cord compression, as well as L4 fracture with severe spinal stenosis and crowding of cauda equina.  Patient was transferred from [facility] to [place] [facility] on 4/28 where he was evaluated by neurosurgery who recommends nonoperative treatment by radiation and oncology.    Subjective  Today reports some improvement in pain with IV pain medication.  Denies any weakness.  A & P   Diffuse spinal infiltration with pathologic fractures (T3, L4)/severe lumbar stenosis with crowding of cauda equina without cord compression.  Concerning for either recurrence of prostate cancer with metastasis or new bronchogenic carcinoma based off spinal imaging.  Has significant pain from infiltrates but no weakness on exam.  No need for surgery given all levels of the lumbar spine infiltrated with tumor and no urgent need for decompression with no red flag symptoms currently ( no weakness, bowel/urine incontinence) -Evaluated by neurosurgeon Dr. Christella Noa on 4/29, not an operative candidate given no need for urgent  decompression and extensive spinal involvement -Oncology consulted, will also discuss with radiation oncology -IV Decadron 4 mg twice daily  Acute back pain, severe.  Pathologic fractures with diffuse marrow infiltration from malignant process mentioned above.  Still in quite a bit of pain with minimal exertion despite being bedrest -Add IV morphine 2 mg every 2 hours for severe pain -Schedule IV Robaxin 1 mg every 8 hours for 3 days -Continue Norco 1-2 tabs 10/325 mg every 6 hours as needed moderate pain  Multiple pulmonary nodules.  Largest in left upper lobe measuring 2 x 5 by 1 x 6 x 1.5 cm, macrolobulated and associated with left hilar lymphadenopathy.  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  Completed radiation therapy in 2016 with no reported recurrence. -PSA pending  Hypothyroidism, stable -continue Synthroid 75 mcg daily  GERD, stable -continue Protonix 20 mg daily  Hyperlipidemia, stable -Continue simvastatin  BPH, stable -Continue Flomax  CAD, stable.  Asymptomatic.  Previously treated stents -Continue aspirin    Family Communication  : None  Code Status : Full  Disposition Plan  :  Patient is from home. Anticipated d/c date:  > 3 days. Barriers to d/c or necessity for inpatient status:  Needs IV pain control for significant back pain, essential metastatic disease of spine needs close monitoring of neurologic status, will likely need to start radiation therapy Consults  : Neurosurgery, oncology,  Procedures  : None  DVT Prophylaxis  :  SCDs  Lab Results  Component Value Date   PLT 244 06/27/2019    Diet :  Diet Order            Diet Heart Room service appropriate? Yes; Fluid consistency: Thin  Diet effective now               Inpatient Medications Scheduled Meds: . aspirin EC   81 mg Oral Daily  . dexamethasone (DECADRON) injection  4 mg Intravenous Q12H  . heparin  5,000 Units Subcutaneous Q8H  . levothyroxine  75 mcg Oral QAC breakfast  . pantoprazole  40 mg Oral Daily  . rosuvastatin  40 mg Oral Daily  . tamsulosin  0.4 mg Oral QHS   Continuous Infusions: . methocarbamol (ROBAXIN) IV     PRN Meds:.ALPRAZolam, gadobutrol, HYDROcodone-acetaminophen, morphine injection  Antibiotics  :   Anti-infectives (From admission, onward)   None       Objective   Vitals:   06/27/19 2307 06/28/19 0050 06/28/19 0536 06/28/19 0933  BP: (!) 151/74 (!) 144/71 126/65 110/66  Pulse: (!) 59 66 65 68  Resp:  19 18 16   Temp:  98.3 F (36.8 C) 98.3 F (36.8 C) 98 F (36.7 C)  TempSrc:  Oral Oral Oral  SpO2: 94% 90% 93% 92%  Weight:      Height:        SpO2: 92 % O2 Flow Rate (L/min): 2 L/min  Wt Readings from Last 3 Encounters:  06/27/19 65.8 kg  05/04/19 67.4 kg  11/13/18 69 kg     Intake/Output Summary (Last 24 hours) at 06/28/2019 1232 Last data filed at 06/27/2019 1810 Gross per 24 hour  Intake 1000 ml  Output --  Net 1000 ml    Physical Exam:     Awake Alert, Oriented X 3, Normal affect Move extremities limited by pain but no weakness, no appreciated deficits [place].AT, Normal respiratory effort on room air, CTAB RRR,No Gallops,Rubs or new Murmurs,  +ve B.Sounds, Abd Soft, No tenderness, No rebound, guarding or rigidity. No Cyanosis, No new Rash or bruise     I have personally reviewed the following:   Data Reviewed:  CBC Recent Labs  Lab 06/27/19 1107  WBC 8.4  HGB 14.6  HCT 43.5  PLT 244  MCV 94.4  MCH 31.7  MCHC 33.6  RDW 12.8  LYMPHSABS 1.1  MONOABS 0.5  EOSABS 0.0  BASOSABS 0.1    Chemistries  Recent Labs  Lab 06/27/19 1107  NA 135  K 4.2  CL 98  CO2 23  GLUCOSE 137*  BUN 17  CREATININE 0.61  CALCIUM 9.6  AST 28  ALT 23  ALKPHOS 306*  BILITOT 0.7    ------------------------------------------------------------------------------------------------------------------ No results for input(s): CHOL, HDL, LDLCALC, TRIG, CHOLHDL, LDLDIRECT in the last 72 hours.  Lab Results  Component Value Date   HGBA1C 5.7 (H) 05/03/2019   ------------------------------------------------------------------------------------------------------------------ No results for input(s): TSH, T4TOTAL, T3FREE, THYROIDAB in the last 72 hours.  Invalid input(s): FREET3 ------------------------------------------------------------------------------------------------------------------ No results for input(s): VITAMINB12, FOLATE, FERRITIN, TIBC, IRON, RETICCTPCT in the last 72 hours.  Coagulation profile No results for input(s): INR, PROTIME in the last 168 hours.  No results for input(s): DDIMER in the last 72 hours.  Cardiac Enzymes No results for input(s): CKMB, TROPONINI, MYOGLOBIN in the last 168 hours.  Invalid input(s): CK ------------------------------------------------------------------------------------------------------------------    Component Value Date/Time   BNP 16.0 09/05/2017 1840    Micro Results Recent Results (from the past 240 hour(s))  Respiratory Panel by RT PCR (  Flu A&B, Covid) - Nasopharyngeal Swab     Status: None   Collection Time: 06/27/19  8:23 PM   Specimen: Nasopharyngeal Swab  Result Value Ref Range Status   SARS Coronavirus 2 by RT PCR NEGATIVE NEGATIVE Final    Comment: (NOTE) SARS-CoV-2 target nucleic acids are NOT DETECTED. The SARS-CoV-2 RNA is generally detectable in upper respiratoy specimens during the acute phase of infection. The lowest concentration of SARS-CoV-2 viral copies this assay can detect is 131 copies/mL. A negative result does not preclude SARS-Cov-2 infection and should not be used as the sole basis for treatment or other patient management decisions. A negative result may occur with  improper  specimen collection/handling, submission of specimen other than nasopharyngeal swab, presence of viral mutation(s) within the areas targeted by this assay, and inadequate number of viral copies (<131 copies/mL). A negative result must be combined with clinical observations, patient history, and epidemiological information. The expected result is Negative. Fact Sheet for Patients:  PinkCheek.be Fact Sheet for Healthcare Providers:  GravelBags.it This test is not yet ap proved or cleared by the [place] FDA and  has been authorized for detection and/or diagnosis of SARS-CoV-2 by FDA under an Emergency Use Authorization (EUA). This EUA will remain  in effect (meaning this test can be used) for the duration of the COVID-19 declaration under Section 564(b)(1) of the Act, 21 U.S.C. section 360bbb-3(b)(1), unless the authorization is terminated or revoked sooner.    Influenza A by PCR NEGATIVE NEGATIVE Final   Influenza B by PCR NEGATIVE NEGATIVE Final    Comment: (NOTE) The Xpert Xpress SARS-CoV-2/FLU/RSV assay is intended as an aid in  the diagnosis of influenza from Nasopharyngeal swab specimens and  should not be used as a sole basis for treatment. Nasal washings and  aspirates are unacceptable for Xpert Xpress SARS-CoV-2/FLU/RSV  testing. Fact Sheet for Patients: PinkCheek.be Fact Sheet for Healthcare Providers: GravelBags.it This test is not yet approved or cleared by the [place] FDA and  has been authorized for detection and/or diagnosis of SARS-CoV-2 by  FDA under an Emergency Use Authorization (EUA). This EUA will remain  in effect (meaning this test can be used) for the duration of the  Covid-19 declaration under Section 564(b)(1) of the Act, 21  U.S.C. section 360bbb-3(b)(1), unless the authorization is  terminated or revoked. Performed at [facility], [address], [place], [place] [address]     Radiology Reports MR CERVICAL SPINE W WO CONTRAST  Result Date: 06/27/2019 CLINICAL DATA:  Back pain, history of prostate cancer, new suspected lung cancer EXAM: MRI CERVICAL, THORACIC AND LUMBAR SPINE WITHOUT AND WITH CONTRAST CONTRAST:  7 mL Gadavist TECHNIQUE: Multiplanar and multiecho pulse sequences of the cervical spine, to include the craniocervical junction and cervicothoracic junction, and thoracic and lumbar spine, were obtained without intravenous contrast. COMPARISON:  None. FINDINGS: MRI CERVICAL SPINE Motion artifact is present Alignment: Anteroposterior alignment is maintained. Vertebrae: There is abnormal marrow signal at multiple levels with greatest involvement of C3 and C4 vertebral bodies. There no compression deformity. No significant epidural disease noted. Cord: No definite abnormal signal within the above limitation. Posterior Fossa, vertebral arteries, paraspinal tissues: Unremarkable. Disc levels: Multilevel disc bulges, endplate osteophytes, and facet and uncovertebral hypertrophy. There is no high-grade canal stenosis. Multilevel foraminal stenosis is present and poorly evaluated due to artifact. MRI THORACIC SPINE Motion artifact is present Alignment:  Anteroposterior alignment is maintained. Vertebrae: Multifocal abnormal marrow signal with greatest involvement of T2-T4 and T11 and  T12. There is mild to moderate compression deformity of T3 with less than 50% loss of height. Probable mild ventral epidural extension at this level. There is some foraminal extension at T2-T3, T3-T4, and T4-T5 levels. Cord:  No abnormal signal within the above limitation. Paraspinal and other soft tissues: Intrathoracic findings are better evaluated on the prior chest CT. Disc levels: Mild degenerative changes are present without significant degenerative stenosis. MRI LUMBAR SPINE Motion artifact is present. Segmentation: Standard. Alignment:  Mild  degenerative listhesis. Vertebrae: There is multifocal abnormal marrow signal throughout the lumbosacral spine and imaged bony pelvis. There is mild compression deformity of L4 with less than 50% loss of height. Ventral epidural disease is present at this level with resulting severe canal stenosis and crowding of cauda equina. Conus medullaris and cauda equina: Conus extends to the L1 level. Paraspinal and other soft tissues: Unremarkable Disc levels: L1-L2:  Disc bulge.  No significant canal or foraminal stenosis. L2-L3: Disc bulge with endplate osteophytic ridging and facet arthropathy with ligamentum flavum infolding. Moderate canal stenosis. Mild foraminal stenosis. L3-L4: Disc bulge with endplate osteophytic ridging and marked facet arthropathy with ligamentum flavum infolding. Marked canal stenosis with effacement of the lateral recesses. Moderate foraminal stenosis. L4-L5: Disc bulge with endplate osteophytic ridging and moderate right and marked left facet arthropathy with ligamentum flavum infolding. Moderate to marked canal stenosis. Moderate to marked foraminal stenosis. L5-S1: Disc bulge with endplate osteophytic ridging and moderate facet arthropathy. No canal stenosis. Moderate to marked foraminal stenosis. IMPRESSION: Motion degraded study with suboptimal evaluation. Diffuse osseous metastatic disease. Mild to moderate compression deformity of T3 with mild ventral epidural extension. No cord compression. Mild compression deformity of L4 with ventral epidural extension resulting in severe canal stenosis and crowding of cauda equina. Multilevel degenerative changes, greatest at lumbar levels with significant canal stenosis at L3-L4 and L4-L5. These results were called by telephone at the time of interpretation on 06/27/2019 at 6:21 pm to provider Dr. Vanita Panda, who verbally acknowledged these results. Electronically Signed   By: Macy Mis M.D.   On: 06/27/2019 18:22   MR THORACIC SPINE W WO  CONTRAST  Result Date: 06/27/2019 CLINICAL DATA:  Back pain, history of prostate cancer, new suspected lung cancer EXAM: MRI CERVICAL, THORACIC AND LUMBAR SPINE WITHOUT AND WITH CONTRAST CONTRAST:  7 mL Gadavist TECHNIQUE: Multiplanar and multiecho pulse sequences of the cervical spine, to include the craniocervical junction and cervicothoracic junction, and thoracic and lumbar spine, were obtained without intravenous contrast. COMPARISON:  None. FINDINGS: MRI CERVICAL SPINE Motion artifact is present Alignment: Anteroposterior alignment is maintained. Vertebrae: There is abnormal marrow signal at multiple levels with greatest involvement of C3 and C4 vertebral bodies. There no compression deformity. No significant epidural disease noted. Cord: No definite abnormal signal within the above limitation. Posterior Fossa, vertebral arteries, paraspinal tissues: Unremarkable. Disc levels: Multilevel disc bulges, endplate osteophytes, and facet and uncovertebral hypertrophy. There is no high-grade canal stenosis. Multilevel foraminal stenosis is present and poorly evaluated due to artifact. MRI THORACIC SPINE Motion artifact is present Alignment:  Anteroposterior alignment is maintained. Vertebrae: Multifocal abnormal marrow signal with greatest involvement of T2-T4 and T11 and T12. There is mild to moderate compression deformity of T3 with less than 50% loss of height. Probable mild ventral epidural extension at this level. There is some foraminal extension at T2-T3, T3-T4, and T4-T5 levels. Cord:  No abnormal signal within the above limitation. Paraspinal and other soft tissues: Intrathoracic findings are better evaluated on the prior chest  CT. Disc levels: Mild degenerative changes are present without significant degenerative stenosis. MRI LUMBAR SPINE Motion artifact is present. Segmentation: Standard. Alignment:  Mild degenerative listhesis. Vertebrae: There is multifocal abnormal marrow signal throughout the  lumbosacral spine and imaged bony pelvis. There is mild compression deformity of L4 with less than 50% loss of height. Ventral epidural disease is present at this level with resulting severe canal stenosis and crowding of cauda equina. Conus medullaris and cauda equina: Conus extends to the L1 level. Paraspinal and other soft tissues: Unremarkable Disc levels: L1-L2:  Disc bulge.  No significant canal or foraminal stenosis. L2-L3: Disc bulge with endplate osteophytic ridging and facet arthropathy with ligamentum flavum infolding. Moderate canal stenosis. Mild foraminal stenosis. L3-L4: Disc bulge with endplate osteophytic ridging and marked facet arthropathy with ligamentum flavum infolding. Marked canal stenosis with effacement of the lateral recesses. Moderate foraminal stenosis. L4-L5: Disc bulge with endplate osteophytic ridging and moderate right and marked left facet arthropathy with ligamentum flavum infolding. Moderate to marked canal stenosis. Moderate to marked foraminal stenosis. L5-S1: Disc bulge with endplate osteophytic ridging and moderate facet arthropathy. No canal stenosis. Moderate to marked foraminal stenosis. IMPRESSION: Motion degraded study with suboptimal evaluation. Diffuse osseous metastatic disease. Mild to moderate compression deformity of T3 with mild ventral epidural extension. No cord compression. Mild compression deformity of L4 with ventral epidural extension resulting in severe canal stenosis and crowding of cauda equina. Multilevel degenerative changes, greatest at lumbar levels with significant canal stenosis at L3-L4 and L4-L5. These results were called by telephone at the time of interpretation on 06/27/2019 at 6:21 pm to provider Dr. Vanita Panda, who verbally acknowledged these results. Electronically Signed   By: Macy Mis M.D.   On: 06/27/2019 18:22   MR Lumbar Spine W Wo Contrast  Result Date: 06/27/2019 CLINICAL DATA:  Back pain, history of prostate cancer, new  suspected lung cancer EXAM: MRI CERVICAL, THORACIC AND LUMBAR SPINE WITHOUT AND WITH CONTRAST CONTRAST:  7 mL Gadavist TECHNIQUE: Multiplanar and multiecho pulse sequences of the cervical spine, to include the craniocervical junction and cervicothoracic junction, and thoracic and lumbar spine, were obtained without intravenous contrast. COMPARISON:  None. FINDINGS: MRI CERVICAL SPINE Motion artifact is present Alignment: Anteroposterior alignment is maintained. Vertebrae: There is abnormal marrow signal at multiple levels with greatest involvement of C3 and C4 vertebral bodies. There no compression deformity. No significant epidural disease noted. Cord: No definite abnormal signal within the above limitation. Posterior Fossa, vertebral arteries, paraspinal tissues: Unremarkable. Disc levels: Multilevel disc bulges, endplate osteophytes, and facet and uncovertebral hypertrophy. There is no high-grade canal stenosis. Multilevel foraminal stenosis is present and poorly evaluated due to artifact. MRI THORACIC SPINE Motion artifact is present Alignment:  Anteroposterior alignment is maintained. Vertebrae: Multifocal abnormal marrow signal with greatest involvement of T2-T4 and T11 and T12. There is mild to moderate compression deformity of T3 with less than 50% loss of height. Probable mild ventral epidural extension at this level. There is some foraminal extension at T2-T3, T3-T4, and T4-T5 levels. Cord:  No abnormal signal within the above limitation. Paraspinal and other soft tissues: Intrathoracic findings are better evaluated on the prior chest CT. Disc levels: Mild degenerative changes are present without significant degenerative stenosis. MRI LUMBAR SPINE Motion artifact is present. Segmentation: Standard. Alignment:  Mild degenerative listhesis. Vertebrae: There is multifocal abnormal marrow signal throughout the lumbosacral spine and imaged bony pelvis. There is mild compression deformity of L4 with less than  50% loss of height. Ventral epidural disease  is present at this level with resulting severe canal stenosis and crowding of cauda equina. Conus medullaris and cauda equina: Conus extends to the L1 level. Paraspinal and other soft tissues: Unremarkable Disc levels: L1-L2:  Disc bulge.  No significant canal or foraminal stenosis. L2-L3: Disc bulge with endplate osteophytic ridging and facet arthropathy with ligamentum flavum infolding. Moderate canal stenosis. Mild foraminal stenosis. L3-L4: Disc bulge with endplate osteophytic ridging and marked facet arthropathy with ligamentum flavum infolding. Marked canal stenosis with effacement of the lateral recesses. Moderate foraminal stenosis. L4-L5: Disc bulge with endplate osteophytic ridging and moderate right and marked left facet arthropathy with ligamentum flavum infolding. Moderate to marked canal stenosis. Moderate to marked foraminal stenosis. L5-S1: Disc bulge with endplate osteophytic ridging and moderate facet arthropathy. No canal stenosis. Moderate to marked foraminal stenosis. IMPRESSION: Motion degraded study with suboptimal evaluation. Diffuse osseous metastatic disease. Mild to moderate compression deformity of T3 with mild ventral epidural extension. No cord compression. Mild compression deformity of L4 with ventral epidural extension resulting in severe canal stenosis and crowding of cauda equina. Multilevel degenerative changes, greatest at lumbar levels with significant canal stenosis at L3-L4 and L4-L5. These results were called by telephone at the time of interpretation on 06/27/2019 at 6:21 pm to provider Dr. Vanita Panda, who verbally acknowledged these results. Electronically Signed   By: Macy Mis M.D.   On: 06/27/2019 18:22   CT Angio Chest/Abd/Pel for Dissection W and/or Wo Contrast  Result Date: 06/27/2019 CLINICAL DATA:  79 year old male with history of severe lower abdominal and back pain. EXAM: CT ANGIOGRAPHY CHEST, ABDOMEN AND PELVIS  TECHNIQUE: Non-contrast CT of the chest was initially obtained. Multidetector CT imaging through the chest, abdomen and pelvis was performed using the standard protocol during bolus administration of intravenous contrast. Multiplanar reconstructed images and MIPs were obtained and reviewed to evaluate the vascular anatomy. CONTRAST:  147mL OMNIPAQUE IOHEXOL 350 MG/ML SOLN COMPARISON:  CT the abdomen and pelvis 04/20/2012. FINDINGS: CTA CHEST FINDINGS Cardiovascular: Precontrast images demonstrate no crescentic high attenuation associated with the wall of the thoracic aorta to suggest acute intramural hematoma. No aneurysm or dissection of the thoracic aorta. 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 Mid thoracic aortic arch measures 2.9 cm in diameter. Descending thoracic aorta measures 2.8 cm in diameter. Heart size is normal. There is no significant pericardial fluid, thickening or pericardial calcification. There is aortic atherosclerosis, as well as atherosclerosis of the great vessels of the mediastinum and the coronary arteries, including calcified atherosclerotic plaque in the left main, left anterior descending, left circumflex and right coronary arteries. Mediastinum/Nodes: Enlarged AP window lymph node measuring 1.7 cm in short axis. Borderline enlarged left hilar lymph nodes measuring up to 1.3 cm in short axis. Small hiatal hernia. No axillary lymphadenopathy. Lungs/Pleura: Multiple pulmonary nodules are noted, largest of which is in the left upper lobe (axial image 63 of series 7 and sagittal image 141 of series 10) measuring 2.5 x 1.6 x 1.5 cm. This lesion has macrolobulated slightly ill-defined margins. Several other smaller pulmonary nodules are noted including a 7 x 4 mm left upper lobe nodule (axial image 65 of series 7), and a 1.0 x 0.5 cm nodule in the superior segment of the right lower lobe (axial image 44 of series 7). No acute consolidative airspace disease. No pleural  effusions. Musculoskeletal: There are no aggressive appearing lytic or blastic lesions noted in the visualized portions of the skeleton. Review of the MIP images confirms the above findings.  CTA ABDOMEN AND PELVIS FINDINGS VASCULAR Aorta: Normal caliber aorta without aneurysm, dissection, vasculitis or significant stenosis. Extensive atherosclerotic disease throughout the abdominal aorta. Celiac: Patent without evidence of dissection, vasculitis or significant stenosis. Mild ectasia of the proximal celiac axis which measures 8 mm in diameter. SMA: Patent without evidence of aneurysm, dissection, vasculitis or significant stenosis. Renals: Both renal arteries are patent without evidence of aneurysm, dissection, vasculitis, fibromuscular dysplasia or significant stenosis. IMA: Patent without evidence of aneurysm, dissection, vasculitis or significant stenosis. Inflow: Patent without evidence of aneurysm, dissection, vasculitis or significant stenosis. Veins: No obvious venous abnormality within the limitations of this arterial phase study. Review of the MIP images confirms the above findings. NON-VASCULAR Hepatobiliary: 1.2 cm low-attenuation lesion in segment 2 of the liver, compatible with a simple cyst. No other larger more suspicious appearing pulmonary nodules or masses are noted. No intra or extrahepatic biliary ductal dilatation. Gallbladder is normal in appearance. Pancreas: No pancreatic mass. No pancreatic ductal dilatation. No pancreatic or peripancreatic fluid collections or inflammatory changes. Spleen: Unremarkable. Adrenals/Urinary Tract: Subcentimeter low-attenuation lesion in the posterior aspect of the interpolar region of the left kidney, too small to characterize, but statistically likely to represent a tiny cyst. Right kidney and bilateral adrenal glands are normal in appearance. No hydroureteronephrosis. Urinary bladder is normal in appearance. Stomach/Bowel: Normal appearance of the stomach. No  pathologic dilatation of small bowel or colon. Numerous colonic diverticulae are noted, without surrounding inflammatory changes to suggest an acute diverticulitis at this time. The appendix is not confidently identified and may be surgically absent. Regardless, there are no inflammatory changes noted adjacent to the cecum to suggest the presence of an acute appendicitis at this time. Lymphatic: No lymphadenopathy noted in the abdomen or pelvis. Reproductive: Fiducial markers in the prostate gland. Other: No significant volume of ascites.  No pneumoperitoneum. Musculoskeletal: There are no aggressive appearing lytic or blastic lesions noted in the visualized portions of the skeleton. Review of the MIP images confirms the above findings. IMPRESSION: 1. No acute findings are noted in the chest, abdomen or pelvis to account for the patient's history of abdominal pain. 2. Multiple pulmonary nodules in the lungs bilaterally, largest of which is a left upper lobe pulmonary nodule measuring 2.5 x 1.6 x 1.5 cm. This is associated with left hilar and AP window lymphadenopathy. 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 Further evaluation with PET-CT is recommended in the near future. No definite signs of metastatic disease in the abdomen or pelvis. 3. Colonic diverticulosis without evidence of acute diverticulitis at this time. 4. Aortic atherosclerosis, in addition to left main and 3 vessel coronary artery disease. Assessment for potential risk factor modification, dietary therapy or pharmacologic therapy may be warranted, if clinically indicated. 5. 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 Recommend annual imaging followup by CTA or MRA. This recommendation follows 2010 ACCF/AHA/AATS/ACR/ASA/SCA/SCAI/SIR/STS/SVM Guidelines for the Diagnosis and Management of Patients with Thoracic Aortic Disease. Circulation.  2010; 121: Q330-Q762. Aortic aneurysm NOS (ICD10-I71.9). 6. Additional incidental findings, as above. Electronically Signed   By: Vinnie Langton M.D.   On: 06/27/2019 12:26     Time Spent in minutes  30     Desiree Hane M.D on 06/28/2019 at 12:32 PM  To page go to [email or web address] - password [facility]

## 2019-06-28 NOTE — Progress Notes (Addendum)
Keith Olson. is a 79 y.o. male patient admitted from [facility] awake, alert - oriented  X 4 - no acute distress noted.  VSS - Blood pressure (!) 144/71, pulse 66, temperature 98.3 F (36.8 C), temperature source Oral, resp. rate 19, height 5\' 8"  (1.727 m), weight 65.8 kg, SpO2 90 %. RA.   IV in place, occlusive dsg intact without redness.  Orientation to room, and floor completed.  Patient declined safety video at this time.  Admission INP armband ID verified with patient/family, and in place.   SR up x 2, fall assessment complete, with patient and family able to verbalize understanding of risk associated with falls, and verbalized understanding to call nsg before up out of bed.  Call light within reach, patient able to voice, and demonstrate understanding.  Skin, clean-dry- intact without evidence of bruising, or skin tears.    No evidence of skin break down noted on exam.    Will cont to eval and treat per MD orders.  Darnelle Catalan, RN 06/28/2019 1:05 AM

## 2019-06-28 NOTE — Consult Note (Signed)
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 is an 79 y.o. male.  HPI: with known history prostate CA, and newly diagnosed bronchogenic CA, whom has not felt right about pelvic region for approximately 41months. He states he has had increased pain in his lower back and pelvic region for approximately a month, with a severe exacerbation over last two weeks, and much worse on day of admission. He has had difficulty with bowel movements, and states urination has been difficult also. Mri today at [facility] revealed multiple metastatic lesions in the spine, and pathologic fractures in the thoracic and lumbar spine. I was called for surgical evaluation. [name] states he has difficulty ambulating due to pain, not weakness.   Past Medical History:  Diagnosis Date  . Agent orange exposure   . Arthritis   . Coronary artery disease    BMS to mid LAD 2001, DES to proximal LAD 2017  . Enlarged prostate    XRT 2016  . Headache    Sinus headaches   . History of depression   . Hyperlipidemia   . Hypothyroidism   . Prostate cancer ([place]) 07/2014   hormonal therapy, external beam radiation therapy  . PTSD (post-traumatic stress disorder)     Past Surgical History:  Procedure Laterality Date  . CARDIAC CATHETERIZATION N/A 12/12/2015   Procedure: Left Heart Cath and Coronary Angiography;  Surgeon: Peter M Martinique, MD;  Location: [place] [facility];  Service: Cardiovascular;  Laterality: N/A;  . CARDIAC CATHETERIZATION N/A 12/12/2015   Procedure: Coronary Stent Intervention;  Surgeon: Peter M Martinique, MD;  Location: [place] [facility];  Service: Cardiovascular;  Laterality: N/A;  . CORONARY STENT INTERVENTION N/A 09/06/2017   Procedure: CORONARY STENT INTERVENTION;  Surgeon: Burnell Blanks, MD;  Location: [place] [facility];  Service: Cardiovascular;  Laterality: N/A;  . HERNIA REPAIR Right   .  LEFT HEART CATH AND CORONARY ANGIOGRAPHY N/A 09/02/2016   Procedure: Left Heart Cath and Coronary Angiography;  Surgeon: Leonie Man, MD;  Location: [place] [facility];  Service: Cardiovascular;  Laterality: N/A;  . LEFT HEART CATH AND CORONARY ANGIOGRAPHY N/A 09/06/2017   Procedure: LEFT HEART CATH AND CORONARY ANGIOGRAPHY;  Surgeon: Burnell Blanks, MD;  Location: [place] [facility];  Service: Cardiovascular;  Laterality: N/A;  . LEFT HEART CATH AND CORONARY ANGIOGRAPHY N/A 05/04/2019   Procedure: LEFT HEART CATH AND CORONARY ANGIOGRAPHY;  Surgeon: Martinique, Peter M, MD;  Location: [place] [facility];  Service: Cardiovascular;  Laterality: N/A;  . PROSTATE BIOPSY N/A 08/28/2014   Procedure: BIOPSY TRANSRECTAL ULTRASONIC PROSTATE (TUBP);  Surgeon: Rana Snare, MD;  Location: [facility];  Service: Urology;  Laterality: N/A;  . TRANSURETHRAL RESECTION OF PROSTATE N/A 08/28/2014   Procedure: TRANSURETHRAL RESECTION OF THE PROSTATE WITH GYRUS INSTRUMENTS;  Surgeon: Rana Snare, MD;  Location: [facility];  Service: Urology;  Laterality: N/A;    Family History  Problem Relation Age of Onset  . Arthritis Mother   . Heart attack Father     Social History:  reports that he has never smoked. He has quit using smokeless tobacco.  His smokeless tobacco use included chew. He reports that he does not drink alcohol or use drugs.  Allergies:  Allergies  Allergen Reactions  . Lipitor [Atorvastatin]     Muscles aches    Medications: I have reviewed the patient's current medications.  Results for orders placed or  performed during the hospital encounter of 06/27/19 (from the past 48 hour(s))  CBC with Differential     Status: None   Collection Time: 06/27/19 11:07 AM  Result Value Ref Range   WBC 8.4 4.0 - 10.5 K/uL   RBC 4.61 4.22 - 5.81 MIL/uL   Hemoglobin 14.6 13.0 - 17.0 g/dL   HCT 43.5 39.0 - 52.0 %   MCV 94.4 80.0 - 100.0 fL   MCH 31.7 26.0 - 34.0 pg   MCHC 33.6 30.0 - 36.0 g/dL   RDW 12.8  11.5 - 15.5 %   Platelets 244 150 - 400 K/uL   nRBC 0.0 0.0 - 0.2 %   Neutrophils Relative % 79 %   Neutro Abs 6.6 1.7 - 7.7 K/uL   Lymphocytes Relative 14 %   Lymphs Abs 1.1 0.7 - 4.0 K/uL   Monocytes Relative 6 %   Monocytes Absolute 0.5 0.1 - 1.0 K/uL   Eosinophils Relative 0 %   Eosinophils Absolute 0.0 0.0 - 0.5 K/uL   Basophils Relative 1 %   Basophils Absolute 0.1 0.0 - 0.1 K/uL   Immature Granulocytes 0 %   Abs Immature Granulocytes 0.03 0.00 - 0.07 K/uL    Comment: Performed at [facility], [address] [place], [place] [address]  Comprehensive metabolic panel     Status: Abnormal   Collection Time: 06/27/19 11:07 AM  Result Value Ref Range   Sodium 135 135 - 145 mmol/L   Potassium 4.2 3.5 - 5.1 mmol/L   Chloride 98 98 - 111 mmol/L   CO2 23 22 - 32 mmol/L   Glucose, Bld 137 (H) 70 - 99 mg/dL    Comment: Glucose reference range applies only to samples taken after fasting for at least 8 hours.   BUN 17 8 - 23 mg/dL   Creatinine, Ser 0.61 0.61 - 1.24 mg/dL   Calcium 9.6 8.9 - 10.3 mg/dL   Total Protein 7.7 6.5 - 8.1 g/dL   Albumin 4.5 3.5 - 5.0 g/dL   AST 28 15 - 41 U/L   ALT 23 0 - 44 U/L   Alkaline Phosphatase 306 (H) 38 - 126 U/L   Total Bilirubin 0.7 0.3 - 1.2 mg/dL   GFR calc non Af Amer >60 >60 mL/min   GFR calc Af Amer >60 >60 mL/min   Anion gap 14 5 - 15    Comment: Performed at [facility], [address] [place], [place] [address]  Urinalysis, Routine w reflex microscopic     Status: Abnormal   Collection Time: 06/27/19  5:56 PM  Result Value Ref Range   Color, Urine YELLOW YELLOW   APPearance CLEAR CLEAR   Specific Gravity, Urine 1.043 (H) 1.005 - 1.030   pH 6.0 5.0 - 8.0   Glucose, UA NEGATIVE NEGATIVE mg/dL   Hgb urine dipstick NEGATIVE NEGATIVE   Bilirubin Urine NEGATIVE NEGATIVE   Ketones, ur 5 (A) NEGATIVE mg/dL   Protein, ur NEGATIVE NEGATIVE mg/dL   Nitrite NEGATIVE NEGATIVE   Leukocytes,Ua NEGATIVE NEGATIVE    Comment: Performed at  [facility], [address], [place],  [address]  Respiratory Panel by RT PCR (Flu A&B, Covid) - Nasopharyngeal Swab     Status: None   Collection Time: 06/27/19  8:23 PM   Specimen: Nasopharyngeal Swab  Result Value Ref Range   SARS Coronavirus 2 by RT PCR NEGATIVE NEGATIVE    Comment: (NOTE) SARS-CoV-2 target nucleic acids are NOT DETECTED. The SARS-CoV-2 RNA is  generally detectable in upper respiratoy specimens during the acute phase of infection. The lowest concentration of SARS-CoV-2 viral copies this assay can detect is 131 copies/mL. A negative result does not preclude SARS-Cov-2 infection and should not be used as the sole basis for treatment or other patient management decisions. A negative result may occur with  improper specimen collection/handling, submission of specimen other than nasopharyngeal swab, presence of viral mutation(s) within the areas targeted by this assay, and inadequate number of viral copies (<131 copies/mL). A negative result must be combined with clinical observations, patient history, and epidemiological information. The expected result is Negative. Fact Sheet for Patients:  PinkCheek.be Fact Sheet for Healthcare Providers:  GravelBags.it This test is not yet ap proved or cleared by the [place] FDA and  has been authorized for detection and/or diagnosis of SARS-CoV-2 by FDA under an Emergency Use Authorization (EUA). This EUA will remain  in effect (meaning this test can be used) for the duration of the COVID-19 declaration under Section 564(b)(1) of the Act, 21 U.S.C. section 360bbb-3(b)(1), unless the authorization is terminated or revoked sooner.    Influenza A by PCR NEGATIVE NEGATIVE   Influenza B by PCR NEGATIVE NEGATIVE    Comment: (NOTE) The Xpert Xpress SARS-CoV-2/FLU/RSV assay is intended as an aid in  the diagnosis of influenza from Nasopharyngeal swab specimens and   should not be used as a sole basis for treatment. Nasal washings and  aspirates are unacceptable for Xpert Xpress SARS-CoV-2/FLU/RSV  testing. Fact Sheet for Patients: PinkCheek.be Fact Sheet for Healthcare Providers: GravelBags.it This test is not yet approved or cleared by the [place] FDA and  has been authorized for detection and/or diagnosis of SARS-CoV-2 by  FDA under an Emergency Use Authorization (EUA). This EUA will remain  in effect (meaning this test can be used) for the duration of the  Covid-19 declaration under Section 564(b)(1) of the Act, 21  U.S.C. section 360bbb-3(b)(1), unless the authorization is  terminated or revoked. Performed at [facility], [address] [place], [place] [address]     MR CERVICAL SPINE W WO CONTRAST  Result Date: 06/27/2019 CLINICAL DATA:  Back pain, history of prostate cancer, new suspected lung cancer EXAM: MRI CERVICAL, THORACIC AND LUMBAR SPINE WITHOUT AND WITH CONTRAST CONTRAST:  7 mL Gadavist TECHNIQUE: Multiplanar and multiecho pulse sequences of the cervical spine, to include the craniocervical junction and cervicothoracic junction, and thoracic and lumbar spine, were obtained without intravenous contrast. COMPARISON:  None. FINDINGS: MRI CERVICAL SPINE Motion artifact is present Alignment: Anteroposterior alignment is maintained. Vertebrae: There is abnormal marrow signal at multiple levels with greatest involvement of C3 and C4 vertebral bodies. There no compression deformity. No significant epidural disease noted. Cord: No definite abnormal signal within the above limitation. Posterior Fossa, vertebral arteries, paraspinal tissues: Unremarkable. Disc levels: Multilevel disc bulges, endplate osteophytes, and facet and uncovertebral hypertrophy. There is no high-grade canal stenosis. Multilevel foraminal stenosis is present and poorly evaluated due to artifact. MRI THORACIC SPINE  Motion artifact is present Alignment:  Anteroposterior alignment is maintained. Vertebrae: Multifocal abnormal marrow signal with greatest involvement of T2-T4 and T11 and T12. There is mild to moderate compression deformity of T3 with less than 50% loss of height. Probable mild ventral epidural extension at this level. There is some foraminal extension at T2-T3, T3-T4, and T4-T5 levels. Cord:  No abnormal signal within the above limitation. Paraspinal and other soft tissues: Intrathoracic findings are better evaluated on the prior chest CT. Disc levels:  Mild degenerative changes are present without significant degenerative stenosis. MRI LUMBAR SPINE Motion artifact is present. Segmentation: Standard. Alignment:  Mild degenerative listhesis. Vertebrae: There is multifocal abnormal marrow signal throughout the lumbosacral spine and imaged bony pelvis. There is mild compression deformity of L4 with less than 50% loss of height. Ventral epidural disease is present at this level with resulting severe canal stenosis and crowding of cauda equina. Conus medullaris and cauda equina: Conus extends to the L1 level. Paraspinal and other soft tissues: Unremarkable Disc levels: L1-L2:  Disc bulge.  No significant canal or foraminal stenosis. L2-L3: Disc bulge with endplate osteophytic ridging and facet arthropathy with ligamentum flavum infolding. Moderate canal stenosis. Mild foraminal stenosis. L3-L4: Disc bulge with endplate osteophytic ridging and marked facet arthropathy with ligamentum flavum infolding. Marked canal stenosis with effacement of the lateral recesses. Moderate foraminal stenosis. L4-L5: Disc bulge with endplate osteophytic ridging and moderate right and marked left facet arthropathy with ligamentum flavum infolding. Moderate to marked canal stenosis. Moderate to marked foraminal stenosis. L5-S1: Disc bulge with endplate osteophytic ridging and moderate facet arthropathy. No canal stenosis. Moderate to marked  foraminal stenosis. IMPRESSION: Motion degraded study with suboptimal evaluation. Diffuse osseous metastatic disease. Mild to moderate compression deformity of T3 with mild ventral epidural extension. No cord compression. Mild compression deformity of L4 with ventral epidural extension resulting in severe canal stenosis and crowding of cauda equina. Multilevel degenerative changes, greatest at lumbar levels with significant canal stenosis at L3-L4 and L4-L5. These results were called by telephone at the time of interpretation on 06/27/2019 at 6:21 pm to provider Dr. Vanita Panda, who verbally acknowledged these results. Electronically Signed   By: Macy Mis M.D.   On: 06/27/2019 18:22   MR THORACIC SPINE W WO CONTRAST  Result Date: 06/27/2019 CLINICAL DATA:  Back pain, history of prostate cancer, new suspected lung cancer EXAM: MRI CERVICAL, THORACIC AND LUMBAR SPINE WITHOUT AND WITH CONTRAST CONTRAST:  7 mL Gadavist TECHNIQUE: Multiplanar and multiecho pulse sequences of the cervical spine, to include the craniocervical junction and cervicothoracic junction, and thoracic and lumbar spine, were obtained without intravenous contrast. COMPARISON:  None. FINDINGS: MRI CERVICAL SPINE Motion artifact is present Alignment: Anteroposterior alignment is maintained. Vertebrae: There is abnormal marrow signal at multiple levels with greatest involvement of C3 and C4 vertebral bodies. There no compression deformity. No significant epidural disease noted. Cord: No definite abnormal signal within the above limitation. Posterior Fossa, vertebral arteries, paraspinal tissues: Unremarkable. Disc levels: Multilevel disc bulges, endplate osteophytes, and facet and uncovertebral hypertrophy. There is no high-grade canal stenosis. Multilevel foraminal stenosis is present and poorly evaluated due to artifact. MRI THORACIC SPINE Motion artifact is present Alignment:  Anteroposterior alignment is maintained. Vertebrae: Multifocal  abnormal marrow signal with greatest involvement of T2-T4 and T11 and T12. There is mild to moderate compression deformity of T3 with less than 50% loss of height. Probable mild ventral epidural extension at this level. There is some foraminal extension at T2-T3, T3-T4, and T4-T5 levels. Cord:  No abnormal signal within the above limitation. Paraspinal and other soft tissues: Intrathoracic findings are better evaluated on the prior chest CT. Disc levels: Mild degenerative changes are present without significant degenerative stenosis. MRI LUMBAR SPINE Motion artifact is present. Segmentation: Standard. Alignment:  Mild degenerative listhesis. Vertebrae: There is multifocal abnormal marrow signal throughout the lumbosacral spine and imaged bony pelvis. There is mild compression deformity of L4 with less than 50% loss of height. Ventral epidural disease is present at this  level with resulting severe canal stenosis and crowding of cauda equina. Conus medullaris and cauda equina: Conus extends to the L1 level. Paraspinal and other soft tissues: Unremarkable Disc levels: L1-L2:  Disc bulge.  No significant canal or foraminal stenosis. L2-L3: Disc bulge with endplate osteophytic ridging and facet arthropathy with ligamentum flavum infolding. Moderate canal stenosis. Mild foraminal stenosis. L3-L4: Disc bulge with endplate osteophytic ridging and marked facet arthropathy with ligamentum flavum infolding. Marked canal stenosis with effacement of the lateral recesses. Moderate foraminal stenosis. L4-L5: Disc bulge with endplate osteophytic ridging and moderate right and marked left facet arthropathy with ligamentum flavum infolding. Moderate to marked canal stenosis. Moderate to marked foraminal stenosis. L5-S1: Disc bulge with endplate osteophytic ridging and moderate facet arthropathy. No canal stenosis. Moderate to marked foraminal stenosis. IMPRESSION: Motion degraded study with suboptimal evaluation. Diffuse osseous  metastatic disease. Mild to moderate compression deformity of T3 with mild ventral epidural extension. No cord compression. Mild compression deformity of L4 with ventral epidural extension resulting in severe canal stenosis and crowding of cauda equina. Multilevel degenerative changes, greatest at lumbar levels with significant canal stenosis at L3-L4 and L4-L5. These results were called by telephone at the time of interpretation on 06/27/2019 at 6:21 pm to provider Dr. Vanita Panda, who verbally acknowledged these results. Electronically Signed   By: Macy Mis M.D.   On: 06/27/2019 18:22   MR Lumbar Spine W Wo Contrast  Result Date: 06/27/2019 CLINICAL DATA:  Back pain, history of prostate cancer, new suspected lung cancer EXAM: MRI CERVICAL, THORACIC AND LUMBAR SPINE WITHOUT AND WITH CONTRAST CONTRAST:  7 mL Gadavist TECHNIQUE: Multiplanar and multiecho pulse sequences of the cervical spine, to include the craniocervical junction and cervicothoracic junction, and thoracic and lumbar spine, were obtained without intravenous contrast. COMPARISON:  None. FINDINGS: MRI CERVICAL SPINE Motion artifact is present Alignment: Anteroposterior alignment is maintained. Vertebrae: There is abnormal marrow signal at multiple levels with greatest involvement of C3 and C4 vertebral bodies. There no compression deformity. No significant epidural disease noted. Cord: No definite abnormal signal within the above limitation. Posterior Fossa, vertebral arteries, paraspinal tissues: Unremarkable. Disc levels: Multilevel disc bulges, endplate osteophytes, and facet and uncovertebral hypertrophy. There is no high-grade canal stenosis. Multilevel foraminal stenosis is present and poorly evaluated due to artifact. MRI THORACIC SPINE Motion artifact is present Alignment:  Anteroposterior alignment is maintained. Vertebrae: Multifocal abnormal marrow signal with greatest involvement of T2-T4 and T11 and T12. There is mild to moderate  compression deformity of T3 with less than 50% loss of height. Probable mild ventral epidural extension at this level. There is some foraminal extension at T2-T3, T3-T4, and T4-T5 levels. Cord:  No abnormal signal within the above limitation. Paraspinal and other soft tissues: Intrathoracic findings are better evaluated on the prior chest CT. Disc levels: Mild degenerative changes are present without significant degenerative stenosis. MRI LUMBAR SPINE Motion artifact is present. Segmentation: Standard. Alignment:  Mild degenerative listhesis. Vertebrae: There is multifocal abnormal marrow signal throughout the lumbosacral spine and imaged bony pelvis. There is mild compression deformity of L4 with less than 50% loss of height. Ventral epidural disease is present at this level with resulting severe canal stenosis and crowding of cauda equina. Conus medullaris and cauda equina: Conus extends to the L1 level. Paraspinal and other soft tissues: Unremarkable Disc levels: L1-L2:  Disc bulge.  No significant canal or foraminal stenosis. L2-L3: Disc bulge with endplate osteophytic ridging and facet arthropathy with ligamentum flavum infolding. Moderate canal stenosis. Mild  foraminal stenosis. L3-L4: Disc bulge with endplate osteophytic ridging and marked facet arthropathy with ligamentum flavum infolding. Marked canal stenosis with effacement of the lateral recesses. Moderate foraminal stenosis. L4-L5: Disc bulge with endplate osteophytic ridging and moderate right and marked left facet arthropathy with ligamentum flavum infolding. Moderate to marked canal stenosis. Moderate to marked foraminal stenosis. L5-S1: Disc bulge with endplate osteophytic ridging and moderate facet arthropathy. No canal stenosis. Moderate to marked foraminal stenosis. IMPRESSION: Motion degraded study with suboptimal evaluation. Diffuse osseous metastatic disease. Mild to moderate compression deformity of T3 with mild ventral epidural extension. No  cord compression. Mild compression deformity of L4 with ventral epidural extension resulting in severe canal stenosis and crowding of cauda equina. Multilevel degenerative changes, greatest at lumbar levels with significant canal stenosis at L3-L4 and L4-L5. These results were called by telephone at the time of interpretation on 06/27/2019 at 6:21 pm to provider Dr. Vanita Panda, who verbally acknowledged these results. Electronically Signed   By: Macy Mis M.D.   On: 06/27/2019 18:22   CT Angio Chest/Abd/Pel for Dissection W and/or Wo Contrast  Result Date: 06/27/2019 CLINICAL DATA:  79 year old male with history of severe lower abdominal and back pain. EXAM: CT ANGIOGRAPHY CHEST, ABDOMEN AND PELVIS TECHNIQUE: Non-contrast CT of the chest was initially obtained. Multidetector CT imaging through the chest, abdomen and pelvis was performed using the standard protocol during bolus administration of intravenous contrast. Multiplanar reconstructed images and MIPs were obtained and reviewed to evaluate the vascular anatomy. CONTRAST:  143mL OMNIPAQUE IOHEXOL 350 MG/ML SOLN COMPARISON:  CT the abdomen and pelvis 04/20/2012. FINDINGS: CTA CHEST FINDINGS Cardiovascular: Precontrast images demonstrate no crescentic high attenuation associated with the wall of the thoracic aorta to suggest acute intramural hematoma. No aneurysm or dissection of the thoracic aorta. 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 Mid thoracic aortic arch measures 2.9 cm in diameter. Descending thoracic aorta measures 2.8 cm in diameter. Heart size is normal. There is no significant pericardial fluid, thickening or pericardial calcification. There is aortic atherosclerosis, as well as atherosclerosis of the great vessels of the mediastinum and the coronary arteries, including calcified atherosclerotic plaque in the left main, left anterior descending, left circumflex and right coronary arteries. Mediastinum/Nodes: Enlarged AP  window lymph node measuring 1.7 cm in short axis. Borderline enlarged left hilar lymph nodes measuring up to 1.3 cm in short axis. Small hiatal hernia. No axillary lymphadenopathy. Lungs/Pleura: Multiple pulmonary nodules are noted, largest of which is in the left upper lobe (axial image 63 of series 7 and sagittal image 141 of series 10) measuring 2.5 x 1.6 x 1.5 cm. This lesion has macrolobulated slightly ill-defined margins. Several other smaller pulmonary nodules are noted including a 7 x 4 mm left upper lobe nodule (axial image 65 of series 7), and a 1.0 x 0.5 cm nodule in the superior segment of the right lower lobe (axial image 44 of series 7). No acute consolidative airspace disease. No pleural effusions. Musculoskeletal: There are no aggressive appearing lytic or blastic lesions noted in the visualized portions of the skeleton. Review of the MIP images confirms the above findings. CTA ABDOMEN AND PELVIS FINDINGS VASCULAR Aorta: Normal caliber aorta without aneurysm, dissection, vasculitis or significant stenosis. Extensive atherosclerotic disease throughout the abdominal aorta. Celiac: Patent without evidence of dissection, vasculitis or significant stenosis. Mild ectasia of the proximal celiac axis which measures 8 mm in diameter. SMA: Patent without evidence of aneurysm, dissection, vasculitis or significant stenosis. Renals: Both renal arteries are  patent without evidence of aneurysm, dissection, vasculitis, fibromuscular dysplasia or significant stenosis. IMA: Patent without evidence of aneurysm, dissection, vasculitis or significant stenosis. Inflow: Patent without evidence of aneurysm, dissection, vasculitis or significant stenosis. Veins: No obvious venous abnormality within the limitations of this arterial phase study. Review of the MIP images confirms the above findings. NON-VASCULAR Hepatobiliary: 1.2 cm low-attenuation lesion in segment 2 of the liver, compatible with a simple cyst. No other  larger more suspicious appearing pulmonary nodules or masses are noted. No intra or extrahepatic biliary ductal dilatation. Gallbladder is normal in appearance. Pancreas: No pancreatic mass. No pancreatic ductal dilatation. No pancreatic or peripancreatic fluid collections or inflammatory changes. Spleen: Unremarkable. Adrenals/Urinary Tract: Subcentimeter low-attenuation lesion in the posterior aspect of the interpolar region of the left kidney, too small to characterize, but statistically likely to represent a tiny cyst. Right kidney and bilateral adrenal glands are normal in appearance. No hydroureteronephrosis. Urinary bladder is normal in appearance. Stomach/Bowel: Normal appearance of the stomach. No pathologic dilatation of small bowel or colon. Numerous colonic diverticulae are noted, without surrounding inflammatory changes to suggest an acute diverticulitis at this time. The appendix is not confidently identified and may be surgically absent. Regardless, there are no inflammatory changes noted adjacent to the cecum to suggest the presence of an acute appendicitis at this time. Lymphatic: No lymphadenopathy noted in the abdomen or pelvis. Reproductive: Fiducial markers in the prostate gland. Other: No significant volume of ascites.  No pneumoperitoneum. Musculoskeletal: There are no aggressive appearing lytic or blastic lesions noted in the visualized portions of the skeleton. Review of the MIP images confirms the above findings. IMPRESSION: 1. No acute findings are noted in the chest, abdomen or pelvis to account for the patient's history of abdominal pain. 2. Multiple pulmonary nodules in the lungs bilaterally, largest of which is a left upper lobe pulmonary nodule measuring 2.5 x 1.6 x 1.5 cm. This is associated with left hilar and AP window lymphadenopathy. 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 Further  evaluation with PET-CT is recommended in the near future. No definite signs of metastatic disease in the abdomen or pelvis. 3. Colonic diverticulosis without evidence of acute diverticulitis at this time. 4. Aortic atherosclerosis, in addition to left main and 3 vessel coronary artery disease. Assessment for potential risk factor modification, dietary therapy or pharmacologic therapy may be warranted, if clinically indicated. 5. 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 Recommend annual imaging followup by CTA or MRA. This recommendation follows 2010 ACCF/AHA/AATS/ACR/ASA/SCA/SCAI/SIR/STS/SVM Guidelines for the Diagnosis and Management of Patients with Thoracic Aortic Disease. Circulation. 2010; 121: W098-J191. Aortic aneurysm NOS (ICD10-I71.9). 6. Additional incidental findings, as above. Electronically Signed   By: Vinnie Langton M.D.   On: 06/27/2019 12:26    Review of Systems  Constitutional: Positive for activity change.  HENT: Negative.   Eyes: Negative.   Respiratory: Negative.   Cardiovascular: Negative.   Endocrine: Negative.   Genitourinary: Positive for decreased urine volume and difficulty urinating.  Musculoskeletal: Positive for back pain.  Skin: Negative.   Allergic/Immunologic: Negative.   Neurological: Negative.   Hematological: Negative.   Psychiatric/Behavioral: Negative.    Blood pressure (!) 144/71, pulse 66, temperature 98.3 F (36.8 C), temperature source Oral, resp. rate 19, height 5\' 8"  (1.727 m), weight 65.8 kg, SpO2 90 %. Physical Exam  Constitutional: He is oriented to person, place, and time. He appears well-developed and well-nourished.  He appears distressed.  HENT:  Head: Normocephalic and atraumatic.  Right Ear: External ear normal.  Left Ear: External ear normal.  Nose: Nose normal.  Mouth/Throat: Oropharynx is clear and moist.  Eyes: Pupils are equal, round, and reactive to light. Conjunctivae and EOM are normal.  Cardiovascular:  Normal rate, regular rhythm and normal heart sounds.  Respiratory: Effort normal and breath sounds normal.  GI: Soft. Bowel sounds are normal.  Musculoskeletal:        General: Normal range of motion.     Cervical back: Normal range of motion and neck supple.  Neurological: He is alert and oriented to person, place, and time. He has normal reflexes. He displays normal reflexes. No cranial nerve deficit or sensory deficit. He exhibits normal muscle tone. Coordination normal.  Gait not assessed  Skin: Skin is warm and dry.  Psychiatric: He has a normal mood and affect. His behavior is normal. Judgment and thought content normal.    Assessment/Plan: Keith Olson. is a 79 y.o. 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 Has possible new bronchogenic CA dx based on admission scans. Does not appear to be an operative candidate given extensive spinal involvement. No weakness necessitating urgent decompression. Would be best served I believe with non operative treatments. Recommend Rad onc involvement in addition to oncology. Diagnosis can be obtained with needle biopsy via IR. Call if there are questions. Pain medication has been given and seems to be helping. May continue decadron, I will change dosing.   Ashok Pall 06/28/2019, 2:22 AM

## 2019-06-29 ENCOUNTER — Ambulatory Visit
Admit: 2019-06-29 | Discharge: 2019-06-29 | Disposition: A | Discharge status: Home / Self Care | Payer: Medicare Other | Attending: Radiation Oncology | Admitting: Radiation Oncology

## 2019-06-29 DIAGNOSIS — E871 Hypo-osmolality and hyponatremia: Secondary | ICD-10-CM

## 2019-06-29 DIAGNOSIS — E039 Hypothyroidism, unspecified: Secondary | ICD-10-CM | POA: Diagnosis not present

## 2019-06-29 DIAGNOSIS — C61 Malignant neoplasm of prostate: Secondary | ICD-10-CM | POA: Diagnosis not present

## 2019-06-29 DIAGNOSIS — I2510 Atherosclerotic heart disease of native coronary artery without angina pectoris: Secondary | ICD-10-CM | POA: Diagnosis not present

## 2019-06-29 DIAGNOSIS — M5442 Lumbago with sciatica, left side: Secondary | ICD-10-CM | POA: Diagnosis not present

## 2019-06-29 LAB — BASIC METABOLIC PANEL
Anion gap: 8 (ref 5–15)
BUN: 20 mg/dL (ref 8–23)
CO2: 25 mmol/L (ref 22–32)
Calcium: 8.7 mg/dL — ABNORMAL LOW (ref 8.9–10.3)
Chloride: 96 mmol/L — ABNORMAL LOW (ref 98–111)
Creatinine, Ser: 0.73 mg/dL (ref 0.61–1.24)
GFR calc Af Amer: >60 mL/min (ref >60)
GFR calc non Af Amer: >60 mL/min (ref >60)
Glucose, Bld: 121 mg/dL — ABNORMAL HIGH (ref 70–99)
Potassium: 4.1 mmol/L (ref 3.5–5.1)
Sodium: 129 mmol/L — ABNORMAL LOW (ref 135–145)

## 2019-06-29 LAB — CBC
HCT: 39.8 % (ref 39.0–52.0)
Hemoglobin: 13.3 g/dL (ref 13.0–17.0)
MCH: 31.5 pg (ref 26.0–34.0)
MCHC: 33.4 g/dL (ref 30.0–36.0)
MCV: 94.3 fL (ref 80.0–100.0)
Platelets: 202 10*3/uL (ref 150–400)
RBC: 4.22 MIL/uL (ref 4.22–5.81)
RDW: 12.9 % (ref 11.5–15.5)
WBC: 13.2 10*3/uL — ABNORMAL HIGH (ref 4.0–10.5)
nRBC: 0 % (ref 0.0–0.2)

## 2019-06-29 LAB — TESTOSTERONE: Testosterone: 210 ng/dL — ABNORMAL LOW (ref 264–916)

## 2019-06-29 MED ORDER — SODIUM CHLORIDE 0.9 % IV SOLN: INTRAVENOUS | Status: AC

## 2019-06-29 NOTE — Care Management Important Message (Signed)
Important Message  Patient Details IM Letter given to Marney Doctor RN Case Manager to present to the Patient Name: Keith Olson. MRN: [id number] Date of Birth: 12-Jan-1941   Medicare Important Message Given:  Yes     Kerin Salen 06/29/2019, 10:18 AM

## 2019-06-29 NOTE — Consult Note (Signed)
NAME:  Keith Olson., MRN:  [id number], DOB:  Jul 18, 1940, LOS: 2 ADMISSION DATE:  06/27/2019, CONSULTATION DATE: April 30 REFERRING MD: Dr. Lonny Prude, CHIEF COMPLAINT: Weakness in legs  Brief History   79 year old male with a past medical history significant for prostate cancer presented with cauda equina syndrome, also found to have an abnormal nodule in his left upper lobe worrisome for bronchogenic carcinoma.  Pulmonary and critical care medicine consulted for further evaluation.  History of present illness   See details above.  This is a pleasant 79 year old male who in 2016 was diagnosed with prostate cancer which was treated with hormonal therapy and external beam radiation therapy.  He also has a history of coronary disease and recently underwent a left heart catheterization a few weeks ago which showed patent stents and medical management was recommended.  He has noted abnormal back pain and pain in his pelvis for several months.  Unfortunately he recently developed trouble urinating and having bowel movements so he presented to the hospital for further evaluation.  He was noted to have leg weakness and imaging studies revealed evidence of spinal metastatic disease.  A CT scan of his chest showed metastatic nodules throughout his lung with one particularly large nodule near a bronchus in the left upper lobe.  He has never smoked cigarettes.  He denies shortness of breath, recent cough or hemoptysis.  Pulmonary and critical care medicine was consulted for further evaluation.  Since admission to the hospital he has started external beam radiation therapy.  Past Medical History  Agent orange exposure Coronary artery disease Prostate cancer Depression Hyperlipidemia Hypothyroidism PTSD  Significant Hospital Events     Consults:  Radiation Oncology Neurosurgery  Pulmonary  Procedures:    Significant Diagnostic Tests:  April 30 CT angiogram chest showed no pulmonary embolism,  multiple pulmonary nodules with the largest being 2.5 x 1.6 x 1.5 in the left upper lobe.  Left hilar and AP window lymphadenopathy also noted.  Findings are highly concerning for bronchogenic carcinoma.  Micro Data:    Antimicrobials:     Interim history/subjective:    Objective   Blood pressure (!) 142/73, pulse 63, temperature 97.9 F (36.6 C), temperature source Oral, resp. rate 18, height 5\' 8"  (1.727 m), weight 65.8 kg, SpO2 92 %.        Intake/Output Summary (Last 24 hours) at 06/29/2019 1812 Last data filed at 06/29/2019 1400 Gross per 24 hour  Intake 320 ml  Output 600 ml  Net -280 ml   Filed Weights   06/27/19 1028  Weight: 65.8 kg    Examination: General:  Mild distress from urinary bladder distension HENT: NCAT OP clear PULM: CTA B, normal effort CV: RRR, no mgr GI: BS+, soft, nontender MSK: normal bulk and tone Neuro: awake, alert, no distress, MAEW   Resolved Hospital Problem list     Assessment & Plan:  79 year old male admitted with cauda equina syndrome in the setting of radiographic evidence of metastatic malignancy.  There is a distinct large left upper lobe nodule which is worrisome for bronchogenic carcinoma though I suppose it is possible this could be related to prostate cancer.  There is associated hilar adenopathy on that side.  In discussion with our advanced pulmonary diagnostic team it seems that the best approach may be to perform conventional bronchoscopy with endobronchial ultrasound for further assessment.  The primary focus of his treatment at this time needs to be on his cauda equina syndrome, input from radiation oncology, neurosurgery  greatly appreciated.  Plan: We will plan to coordinate his bronchoscopy with endobronchial ultrasound with his radiation treatment.  This could be performed while he of the still an inpatient or could also be performed as an outpatient depending on his preference and how treatment goes. We will follow  peripherally and will make arrangements for bronchoscopy next week.  Best practice:   Per [facility]  Labs   CBC: Recent Labs  Lab 06/27/19 1107 06/29/19 0456  WBC 8.4 13.2*  NEUTROABS 6.6  --   HGB 14.6 13.3  HCT 43.5 39.8  MCV 94.4 94.3  PLT 244 789    Basic Metabolic Panel: Recent Labs  Lab 06/27/19 1107 06/29/19 0456  NA 135 129*  K 4.2 4.1  CL 98 96*  CO2 23 25  GLUCOSE 137* 121*  BUN 17 20  CREATININE 0.61 0.73  CALCIUM 9.6 8.7*   GFR: Estimated Creatinine Clearance: 72 mL/min (by C-G formula based on SCr of 0.73 mg/dL). Recent Labs  Lab 06/27/19 1107 06/29/19 0456  WBC 8.4 13.2*    Liver Function Tests: Recent Labs  Lab 06/27/19 1107  AST 28  ALT 23  ALKPHOS 306*  BILITOT 0.7  PROT 7.7  ALBUMIN 4.5   No results for input(s): LIPASE, AMYLASE in the last 168 hours. No results for input(s): AMMONIA in the last 168 hours.  ABG No results found for: PHART, PCO2ART, PO2ART, HCO3, TCO2, ACIDBASEDEF, O2SAT   Coagulation Profile: No results for input(s): INR, PROTIME in the last 168 hours.  Cardiac Enzymes: No results for input(s): CKTOTAL, CKMB, CKMBINDEX, TROPONINI in the last 168 hours.  HbA1C: Hgb A1c MFr Bld  Date/Time Value Ref Range Status  05/03/2019 10:59 PM 5.7 (H) 4.8 - 5.6 % Final    Comment:    (NOTE) Pre diabetes:          5.7%-6.4% Diabetes:              >6.4% Glycemic control for   <7.0% adults with diabetes     CBG: No results for input(s): GLUCAP in the last 168 hours.  Review of Systems:   Gen: Denies fever, chills, weight change, fatigue, night sweats HEENT: Denies blurred vision, double vision, hearing loss, tinnitus, sinus congestion, rhinorrhea, sore throat, neck stiffness, dysphagia PULM:per HPI CV: Denies chest pain, edema, orthopnea, paroxysmal nocturnal dyspnea, palpitations GI: Denies abdominal pain, nausea, vomiting, diarrhea, hematochezia, melena, constipation, change in bowel habits GU: Denies dysuria,  hematuria, polyuria, oliguria, urethral discharge Endocrine: Denies hot or cold intolerance, polyuria, polyphagia or appetite change Derm: Denies rash, dry skin, scaling or peeling skin change Heme: Denies easy bruising, bleeding, bleeding gums Neuro: Denies headache, numbness, weakness, slurred speech, loss of memory or consciousness   Past Medical History  He,  has a past medical history of Agent orange exposure, Arthritis, Coronary artery disease, Enlarged prostate, Headache, History of depression, Hyperlipidemia, Hypothyroidism, Prostate cancer ([place]) (07/2014), and PTSD (post-traumatic stress disorder).   Surgical History    Past Surgical History:  Procedure Laterality Date  . CARDIAC CATHETERIZATION N/A 12/12/2015   Procedure: Left Heart Cath and Coronary Angiography;  Surgeon: Peter M Martinique, MD;  Location: [facility];  Service: Cardiovascular;  Laterality: N/A;  . CARDIAC CATHETERIZATION N/A 12/12/2015   Procedure: Coronary Stent Intervention;  Surgeon: Peter M Martinique, MD;  Location: [facility];  Service: Cardiovascular;  Laterality: N/A;  . CORONARY STENT INTERVENTION N/A 09/06/2017   Procedure: CORONARY STENT INTERVENTION;  Surgeon: Burnell Blanks, MD;  Location: [facility];  Service: Cardiovascular;  Laterality: N/A;  . HERNIA REPAIR Right   . LEFT HEART CATH AND CORONARY ANGIOGRAPHY N/A 09/02/2016   Procedure: Left Heart Cath and Coronary Angiography;  Surgeon: Leonie Man, MD;  Location: [facility];  Service: Cardiovascular;  Laterality: N/A;  . LEFT HEART CATH AND CORONARY ANGIOGRAPHY N/A 09/06/2017   Procedure: LEFT HEART CATH AND CORONARY ANGIOGRAPHY;  Surgeon: Burnell Blanks, MD;  Location: [facility];  Service: Cardiovascular;  Laterality: N/A;  . LEFT HEART CATH AND CORONARY ANGIOGRAPHY N/A 05/04/2019   Procedure: LEFT HEART CATH AND CORONARY ANGIOGRAPHY;  Surgeon: Martinique, Peter M, MD;  Location: [facility];   Service: Cardiovascular;  Laterality: N/A;  . PROSTATE BIOPSY N/A 08/28/2014   Procedure: BIOPSY TRANSRECTAL ULTRASONIC PROSTATE (TUBP);  Surgeon: Rana Snare, MD;  Location: [facility];  Service: Urology;  Laterality: N/A;  . TRANSURETHRAL RESECTION OF PROSTATE N/A 08/28/2014   Procedure: TRANSURETHRAL RESECTION OF THE PROSTATE WITH GYRUS INSTRUMENTS;  Surgeon: Rana Snare, MD;  Location: [facility];  Service: Urology;  Laterality: N/A;     Social History   reports that he has never smoked. He has quit using smokeless tobacco.  His smokeless tobacco use included chew. He reports that he does not drink alcohol or use drugs.   Family History   His family history includes Arthritis in his mother; Heart attack in his father.   Allergies Allergies  Allergen Reactions  . Lipitor [Atorvastatin]     Muscles aches     Home Medications  Prior to Admission medications   Medication Sig Start Date End Date Taking? Authorizing Provider  aspirin EC 81 MG tablet Take 1 tablet (81 mg total) by mouth daily. 12/09/15  Yes Satira Sark, MD  diclofenac sodium (VOLTAREN) 1 % GEL APPLY 2 GRAMS TOPICALLY 4 TIMES DAILY Patient taking differently: Apply 2 g topically 4 (four) times daily as needed.  12/21/18  Yes Dettinger, Fransisca Kaufmann, MD  HYDROcodone-acetaminophen (NORCO) 10-325 MG tablet Take 1 tablet by mouth every 6 (six) hours as needed. 06/26/19  Yes [provider]  levothyroxine (SYNTHROID, LEVOTHROID) 75 MCG tablet Take 1 tablet (75 mcg total) by mouth daily before breakfast. 09/16/17  Yes Dettinger, Fransisca Kaufmann, MD  tamsulosin (FLOMAX) 0.4 MG CAPS capsule Take 0.4 mg by mouth at bedtime. 05/23/18  Yes [provider]  ALPRAZolam Duanne Moron) 0.5 MG tablet Take 0.5 mg by mouth 2 (two) times daily as needed. 04/05/19   [provider]  celecoxib (CELEBREX) 100 MG capsule Take 100 capsules by mouth 2 (two) times daily as needed.  01/24/19   [provider]  nitroGLYCERIN (NITROSTAT)  0.4 MG SL tablet Place 1 tablet (0.4 mg total) under the tongue every 5 (five) minutes as needed for chest pain. X 3 doses Patient taking differently: Place 0.4 mg under the tongue every 5 (five) minutes as needed for chest pain.  09/07/17   Bhagat, Crista Luria, PA  rosuvastatin (CRESTOR) 40 MG tablet Take 1 tablet (40 mg total) by mouth daily. Patient not taking: Reported on 06/27/2019 09/07/17   Leanor Kail, PA  SUMAtriptan (IMITREX) 25 MG tablet TAKE 1 TABLET BY MOUTH EVERY 2 HOURS AS NEEDED FOR MIGRAINE. MAY REPEAT IN 2 HOURS IF HEADACHE PERSISTS OR RECURS Patient taking differently: Take 25 mg by mouth every 2 (two) hours as needed for migraine or headache. TAKE 1 TABLET BY MOUTH EVERY 2 HOURS AS NEEDED FOR MIGRAINE. MAY  REPEAT IN 2 HOURS IF HEADACHE PERSISTS OR RECURS 12/08/18   Dettinger, Fransisca Kaufmann, MD     Critical care time: n/a     Roselie Awkward, MD [facility] Pager: [phone] Cell: [phone] If no response, call [phone]

## 2019-06-29 NOTE — Progress Notes (Signed)
[facility]  PROGRESS NOTE  Keith Olson. DTO:[id number] DOB: 05/20/1940 DOA: 06/27/2019 PCP: Dettinger, Fransisca Kaufmann, MD Admit date - 06/27/2019   Admitting Physician Albertine Patricia, MD  Outpatient Primary MD for the patient is Dettinger, Fransisca Kaufmann, MD  LOS - 2 Brief Narrative   Keith Olson. is a 79 y.o. year old male with medical history significant for history of prostate cancer status post radiation therapy (2016) who presented on 06/27/2019 with ongoing lower back and pelvic area pain for 6 months that acutely worsened over the past 2 weeks.  Was previously treated by his PCP with Vicodin with no improvement in pain and recommended to go to ED.  Patient presented to [facility], CT of chest abdomen pelvis was negative for PE new mass concerning for bronchogenic carcinoma lymph node involvement.  MRI cervical/thoracic/lumbar spine showed extensive metastatic osseous lesions with T3 fracture but no cord compression, as well as L4 fracture with severe spinal stenosis and crowding of cauda equina.  Patient was transferred from [facility] to [facility] on 4/28 where he was evaluated by neurosurgery who recommends nonoperative treatment by radiation and oncology.    Subjective  Today still having back pain but significantly improved with current pain medications.  Tolerated radiation started yesterday  A & P   Diffuse spinal infiltration with pathologic fractures (T3, L4)/severe lumbar stenosis with crowding of cauda equina without cord compression.  Concerning for either recurrence of prostate cancer (PSA quite elevated )with metastasis or new bronchogenic carcinoma (given large lung nodule on CT chest imaging ).  Has significant pain from infiltrates but no weakness on exam.  Not a candidate for surgery based off neurosurgery evaluation given all levels of the lumbar spine infiltrated with tumor and no urgent need for decompression with no red flag symptoms currently ( no weakness,  bowel/urine incontinence).  Started emergent radiation on 4/29 -Evaluated by neurosurgeon Dr. Christella Noa on 4/29, not an operative candidate given no need for urgent decompression and extensive spinal involvement -Oncology consulted, -Currently undergoing palliative r radiotherapy by radiation oncology -IV Decadron 4 mg twice daily  Acute back pain, severe.  Pathologic fractures with diffuse marrow infiltration from malignant process mentioned above.  Still in quite a bit of pain with minimal exertion despite being bedrest -Add IV morphine 2 mg every 2 hours for severe pain -Schedule IV Robaxin 1 mg every 8 hours for 3 days -Continue Norco 1-2 tabs 10/325 mg every 6 hours as needed moderate pain  Multiple pulmonary nodules.  Largest in left upper lobe measuring 2 x 5 by 1 x 6 x 1.5 cm, macrolobulated and associated with left hilar lymphadenopathy.  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  Completed radiation therapy in 2016 with no reported recurrence.  Repeat PSA 239 -Oncology consulted and following, started on bicalutamide (energy deprivation therapy), after 14 days of treatment can start Lupron therapy  Hyponatremia, suspect related to diminished oral intake.  Looks a bit lying down examination.  Not on any medications that could be contributing -Gentle IV fluids x24 hours -Repeat BMP in a.m.  Hypothyroidism, stable -continue Synthroid 75 mcg daily  GERD, stable -continue Protonix 20 mg daily  Hyperlipidemia, stable -Continue simvastatin  BPH, stable -Continue Flomax  CAD, stable.  Asymptomatic.  Previously treated stents -Continue aspirin    Family Communication  : Son updated at bedside.  Wife also present    Code Status : Full  Disposition Plan  :  Patient is from home. Anticipated d/c date:  > 3 days. Barriers to d/c or necessity for inpatient status:  Needs IV pain control for significant back pain,metastatic disease of spine needs close monitoring of neurologic status, started emergent radiation therapy, pulmonary arranging for bronchoscopy Consults  : Neurosurgery, oncology,  Procedures  : Radiation therapy started 4/29  DVT Prophylaxis  :  SCDs  Lab Results  Component Value Date   PLT 202 06/29/2019    Diet :  Diet Order            Diet Heart Room service appropriate? Yes; Fluid consistency: Thin  Diet effective now               Inpatient Medications Scheduled Meds: . aspirin EC  81 mg Oral Daily  . bicalutamide  50 mg Oral Daily  . dexamethasone (DECADRON) injection  4 mg Intravenous Q12H  . heparin  5,000 Units Subcutaneous Q8H  . levothyroxine  75 mcg Oral QAC breakfast  . pantoprazole  40 mg Oral Daily  . rosuvastatin  40 mg Oral Daily  . tamsulosin  0.4 mg Oral QHS   Continuous Infusions: . sodium chloride 75 mL/hr at 06/29/19 1146  . methocarbamol (ROBAXIN) IV 1,000 mg (06/29/19 2155)   PRN Meds:.ALPRAZolam, gadobutrol, HYDROcodone-acetaminophen, HYDROmorphone (DILAUDID) injection  Antibiotics  :   Anti-infectives (From admission, onward)   None       Objective   Vitals:   06/28/19 2039 06/29/19 0539 06/29/19 1651 06/29/19 2153  BP: (!) 143/72 136/70 (!) 142/73 (!) 161/76  Pulse: (!) 59 (!) 56 63 61  Resp: 18 18 18 16   Temp: 97.9 F (36.6 C) 97.6 F (36.4 C) 97.9 F (36.6 C) 98.3 F (36.8 C)  TempSrc: Oral Oral Oral Oral  SpO2: 92% 91% 92% 93%  Weight:      Height:        SpO2: 93 % O2 Flow Rate (L/min): 2 L/min  Wt Readings from Last 3 Encounters:  06/27/19 65.8 kg  05/04/19 67.4 kg  11/13/18 69 kg     Intake/Output Summary (Last 24 hours) at 06/29/2019 2204 Last data filed at 06/29/2019 1904 Gross per 24 hour  Intake 680 ml   Output 800 ml  Net -120 ml    Physical Exam:     Awake Alert, Oriented X 3, Normal affect Dry oral mucosa Move extremities limited by pain but no weakness or loss of sensation, no appreciated deficits [place].AT, Normal respiratory effort on room air, CTAB RRR,No Gallops,Rubs or new Murmurs,  +ve B.Sounds, Abd Soft, No tenderness, No rebound, guarding or rigidity. No Cyanosis, No new Rash or bruise     I have personally reviewed the following:   Data Reviewed:  CBC Recent Labs  Lab 06/27/19 1107 06/29/19 0456  WBC 8.4 13.2*  HGB 14.6 13.3  HCT 43.5 39.8  PLT 244 202  MCV 94.4 94.3  MCH 31.7 31.5  MCHC 33.6 33.4  RDW 12.8 12.9  LYMPHSABS 1.1  --   MONOABS 0.5  --   EOSABS 0.0  --   BASOSABS 0.1  --     Chemistries  Recent Labs  Lab 06/27/19 1107 06/29/19 0456  NA 135 129*  K 4.2 4.1  CL 98 96*  CO2 23 25  GLUCOSE 137* 121*  BUN 17 20  CREATININE 0.61 0.73  CALCIUM 9.6 8.7*  AST 28  --   ALT 23  --   ALKPHOS 306*  --   BILITOT 0.7  --    ------------------------------------------------------------------------------------------------------------------ No results for input(s): CHOL, HDL, LDLCALC, TRIG, CHOLHDL, LDLDIRECT in the last 72 hours.  Lab Results  Component Value Date   HGBA1C 5.7 (H) 05/03/2019   ------------------------------------------------------------------------------------------------------------------ No results for input(s): TSH, T4TOTAL, T3FREE, THYROIDAB in the last 72 hours.  Invalid input(s): FREET3 ------------------------------------------------------------------------------------------------------------------ No results for input(s): VITAMINB12, FOLATE, FERRITIN, TIBC, IRON, RETICCTPCT in the last 72 hours.  Coagulation profile No results for input(s): INR, PROTIME in the last 168 hours.  No results for input(s): DDIMER in the last 72 hours.  Cardiac Enzymes No results for input(s): CKMB, TROPONINI, MYOGLOBIN in the  last 168 hours.  Invalid input(s): CK ------------------------------------------------------------------------------------------------------------------    Component Value Date/Time   BNP 16.0 09/05/2017 1840    Micro Results Recent Results (from the past 240 hour(s))  Respiratory Panel by RT PCR (Flu A&B, Covid) - Nasopharyngeal Swab     Status: None   Collection Time: 06/27/19  8:23 PM   Specimen: Nasopharyngeal Swab  Result Value Ref Range Status   SARS Coronavirus 2 by RT PCR NEGATIVE NEGATIVE Final    Comment: (NOTE) SARS-CoV-2 target nucleic acids are NOT DETECTED. The SARS-CoV-2 RNA is generally detectable in upper respiratoy specimens during the acute phase of infection. The lowest concentration of SARS-CoV-2 viral copies this assay can detect is 131 copies/mL. A negative result does not preclude SARS-Cov-2 infection and should not be used as the sole basis for treatment or other patient management decisions. A negative result may occur with  improper specimen collection/handling, submission of specimen other than nasopharyngeal swab, presence of viral mutation(s) within the areas targeted by this assay, and inadequate number of viral copies (<131 copies/mL). A negative result must be combined with clinical observations, patient history, and epidemiological information. The expected result is Negative. Fact Sheet for Patients:  PinkCheek.be Fact Sheet for Healthcare Providers:  GravelBags.it This test is not yet ap proved or cleared by the [place] FDA and  has been authorized for detection and/or diagnosis of SARS-CoV-2 by FDA under an Emergency Use Authorization (EUA). This EUA will remain  in effect (meaning this test can be used) for the duration of the COVID-19 declaration under Section 564(b)(1) of the Act, 21 U.S.C. section 360bbb-3(b)(1), unless the authorization is terminated or revoked sooner.     Influenza A by PCR NEGATIVE NEGATIVE Final   Influenza B by PCR NEGATIVE NEGATIVE Final    Comment: (NOTE) The Xpert Xpress SARS-CoV-2/FLU/RSV assay is intended as an aid in  the diagnosis of influenza from Nasopharyngeal swab specimens and  should not be used as a sole basis for treatment. Nasal washings and  aspirates are unacceptable for Xpert Xpress SARS-CoV-2/FLU/RSV  testing. Fact Sheet for Patients: PinkCheek.be Fact Sheet for Healthcare Providers: GravelBags.it This test is not yet approved or cleared by the [place] FDA and  has been authorized for detection and/or diagnosis of SARS-CoV-2 by  FDA under an Emergency Use Authorization (EUA). This EUA will remain  in effect (meaning this test can be used) for the duration of the  Covid-19 declaration under Section 564(b)(1) of the Act, 21  U.S.C. section 360bbb-3(b)(1), unless the authorization is  terminated or revoked. Performed at [facility], [address], [place], [place] [address]     Radiology Reports [place] [facility]  WO CONTRAST  Result Date: 06/27/2019 CLINICAL DATA:  Back pain, history of prostate cancer, new suspected lung cancer EXAM: MRI CERVICAL, THORACIC AND LUMBAR SPINE WITHOUT AND WITH CONTRAST CONTRAST:  7 mL Gadavist TECHNIQUE: Multiplanar and multiecho pulse sequences of the cervical spine, to include the craniocervical junction and cervicothoracic junction, and thoracic and lumbar spine, were obtained without intravenous contrast. COMPARISON:  None. FINDINGS: MRI CERVICAL SPINE Motion artifact is present Alignment: Anteroposterior alignment is maintained. Vertebrae: There is abnormal marrow signal at multiple levels with greatest involvement of C3 and C4 vertebral bodies. There no compression deformity. No significant epidural disease noted. Cord: No definite abnormal signal within the above limitation. Posterior Fossa, vertebral arteries,  paraspinal tissues: Unremarkable. Disc levels: Multilevel disc bulges, endplate osteophytes, and facet and uncovertebral hypertrophy. There is no high-grade canal stenosis. Multilevel foraminal stenosis is present and poorly evaluated due to artifact. MRI THORACIC SPINE Motion artifact is present Alignment:  Anteroposterior alignment is maintained. Vertebrae: Multifocal abnormal marrow signal with greatest involvement of T2-T4 and T11 and T12. There is mild to moderate compression deformity of T3 with less than 50% loss of height. Probable mild ventral epidural extension at this level. There is some foraminal extension at T2-T3, T3-T4, and T4-T5 levels. Cord:  No abnormal signal within the above limitation. Paraspinal and other soft tissues: Intrathoracic findings are better evaluated on the prior chest CT. Disc levels: Mild degenerative changes are present without significant degenerative stenosis. MRI LUMBAR SPINE Motion artifact is present. Segmentation: Standard. Alignment:  Mild degenerative listhesis. Vertebrae: There is multifocal abnormal marrow signal throughout the lumbosacral spine and imaged bony pelvis. There is mild compression deformity of L4 with less than 50% loss of height. Ventral epidural disease is present at this level with resulting severe canal stenosis and crowding of cauda equina. Conus medullaris and cauda equina: Conus extends to the L1 level. Paraspinal and other soft tissues: Unremarkable Disc levels: L1-L2:  Disc bulge.  No significant canal or foraminal stenosis. L2-L3: Disc bulge with endplate osteophytic ridging and facet arthropathy with ligamentum flavum infolding. Moderate canal stenosis. Mild foraminal stenosis. L3-L4: Disc bulge with endplate osteophytic ridging and marked facet arthropathy with ligamentum flavum infolding. Marked canal stenosis with effacement of the lateral recesses. Moderate foraminal stenosis. L4-L5: Disc bulge with endplate osteophytic ridging and moderate  right and marked left facet arthropathy with ligamentum flavum infolding. Moderate to marked canal stenosis. Moderate to marked foraminal stenosis. L5-S1: Disc bulge with endplate osteophytic ridging and moderate facet arthropathy. No canal stenosis. Moderate to marked foraminal stenosis. IMPRESSION: Motion degraded study with suboptimal evaluation. Diffuse osseous metastatic disease. Mild to moderate compression deformity of T3 with mild ventral epidural extension. No cord compression. Mild compression deformity of L4 with ventral epidural extension resulting in severe canal stenosis and crowding of cauda equina. Multilevel degenerative changes, greatest at lumbar levels with significant canal stenosis at L3-L4 and L4-L5. These results were called by telephone at the time of interpretation on 06/27/2019 at 6:21 pm to provider Dr. Vanita Panda, who verbally acknowledged these results. Electronically Signed   By: Macy Mis M.D.   On: 06/27/2019 18:22   MR THORACIC SPINE W WO CONTRAST  Result Date: 06/27/2019 CLINICAL DATA:  Back pain, history of prostate cancer, new suspected lung cancer EXAM: MRI CERVICAL, THORACIC AND LUMBAR SPINE WITHOUT AND WITH CONTRAST CONTRAST:  7 mL Gadavist TECHNIQUE: Multiplanar and multiecho pulse sequences of the cervical spine, to include the craniocervical junction and cervicothoracic junction, and thoracic and lumbar spine, were  obtained without intravenous contrast. COMPARISON:  None. FINDINGS: MRI CERVICAL SPINE Motion artifact is present Alignment: Anteroposterior alignment is maintained. Vertebrae: There is abnormal marrow signal at multiple levels with greatest involvement of C3 and C4 vertebral bodies. There no compression deformity. No significant epidural disease noted. Cord: No definite abnormal signal within the above limitation. Posterior Fossa, vertebral arteries, paraspinal tissues: Unremarkable. Disc levels: Multilevel disc bulges, endplate osteophytes, and facet and  uncovertebral hypertrophy. There is no high-grade canal stenosis. Multilevel foraminal stenosis is present and poorly evaluated due to artifact. MRI THORACIC SPINE Motion artifact is present Alignment:  Anteroposterior alignment is maintained. Vertebrae: Multifocal abnormal marrow signal with greatest involvement of T2-T4 and T11 and T12. There is mild to moderate compression deformity of T3 with less than 50% loss of height. Probable mild ventral epidural extension at this level. There is some foraminal extension at T2-T3, T3-T4, and T4-T5 levels. Cord:  No abnormal signal within the above limitation. Paraspinal and other soft tissues: Intrathoracic findings are better evaluated on the prior chest CT. Disc levels: Mild degenerative changes are present without significant degenerative stenosis. MRI LUMBAR SPINE Motion artifact is present. Segmentation: Standard. Alignment:  Mild degenerative listhesis. Vertebrae: There is multifocal abnormal marrow signal throughout the lumbosacral spine and imaged bony pelvis. There is mild compression deformity of L4 with less than 50% loss of height. Ventral epidural disease is present at this level with resulting severe canal stenosis and crowding of cauda equina. Conus medullaris and cauda equina: Conus extends to the L1 level. Paraspinal and other soft tissues: Unremarkable Disc levels: L1-L2:  Disc bulge.  No significant canal or foraminal stenosis. L2-L3: Disc bulge with endplate osteophytic ridging and facet arthropathy with ligamentum flavum infolding. Moderate canal stenosis. Mild foraminal stenosis. L3-L4: Disc bulge with endplate osteophytic ridging and marked facet arthropathy with ligamentum flavum infolding. Marked canal stenosis with effacement of the lateral recesses. Moderate foraminal stenosis. L4-L5: Disc bulge with endplate osteophytic ridging and moderate right and marked left facet arthropathy with ligamentum flavum infolding. Moderate to marked canal  stenosis. Moderate to marked foraminal stenosis. L5-S1: Disc bulge with endplate osteophytic ridging and moderate facet arthropathy. No canal stenosis. Moderate to marked foraminal stenosis. IMPRESSION: Motion degraded study with suboptimal evaluation. Diffuse osseous metastatic disease. Mild to moderate compression deformity of T3 with mild ventral epidural extension. No cord compression. Mild compression deformity of L4 with ventral epidural extension resulting in severe canal stenosis and crowding of cauda equina. Multilevel degenerative changes, greatest at lumbar levels with significant canal stenosis at L3-L4 and L4-L5. These results were called by telephone at the time of interpretation on 06/27/2019 at 6:21 pm to provider Dr. Vanita Panda, who verbally acknowledged these results. Electronically Signed   By: Macy Mis M.D.   On: 06/27/2019 18:22   MR Lumbar Spine W Wo Contrast  Result Date: 06/27/2019 CLINICAL DATA:  Back pain, history of prostate cancer, new suspected lung cancer EXAM: MRI CERVICAL, THORACIC AND LUMBAR SPINE WITHOUT AND WITH CONTRAST CONTRAST:  7 mL Gadavist TECHNIQUE: Multiplanar and multiecho pulse sequences of the cervical spine, to include the craniocervical junction and cervicothoracic junction, and thoracic and lumbar spine, were obtained without intravenous contrast. COMPARISON:  None. FINDINGS: MRI CERVICAL SPINE Motion artifact is present Alignment: Anteroposterior alignment is maintained. Vertebrae: There is abnormal marrow signal at multiple levels with greatest involvement of C3 and C4 vertebral bodies. There no compression deformity. No significant epidural disease noted. Cord: No definite abnormal signal within the above limitation. Posterior Fossa, vertebral  arteries, paraspinal tissues: Unremarkable. Disc levels: Multilevel disc bulges, endplate osteophytes, and facet and uncovertebral hypertrophy. There is no high-grade canal stenosis. Multilevel foraminal stenosis is  present and poorly evaluated due to artifact. MRI THORACIC SPINE Motion artifact is present Alignment:  Anteroposterior alignment is maintained. Vertebrae: Multifocal abnormal marrow signal with greatest involvement of T2-T4 and T11 and T12. There is mild to moderate compression deformity of T3 with less than 50% loss of height. Probable mild ventral epidural extension at this level. There is some foraminal extension at T2-T3, T3-T4, and T4-T5 levels. Cord:  No abnormal signal within the above limitation. Paraspinal and other soft tissues: Intrathoracic findings are better evaluated on the prior chest CT. Disc levels: Mild degenerative changes are present without significant degenerative stenosis. MRI LUMBAR SPINE Motion artifact is present. Segmentation: Standard. Alignment:  Mild degenerative listhesis. Vertebrae: There is multifocal abnormal marrow signal throughout the lumbosacral spine and imaged bony pelvis. There is mild compression deformity of L4 with less than 50% loss of height. Ventral epidural disease is present at this level with resulting severe canal stenosis and crowding of cauda equina. Conus medullaris and cauda equina: Conus extends to the L1 level. Paraspinal and other soft tissues: Unremarkable Disc levels: L1-L2:  Disc bulge.  No significant canal or foraminal stenosis. L2-L3: Disc bulge with endplate osteophytic ridging and facet arthropathy with ligamentum flavum infolding. Moderate canal stenosis. Mild foraminal stenosis. L3-L4: Disc bulge with endplate osteophytic ridging and marked facet arthropathy with ligamentum flavum infolding. Marked canal stenosis with effacement of the lateral recesses. Moderate foraminal stenosis. L4-L5: Disc bulge with endplate osteophytic ridging and moderate right and marked left facet arthropathy with ligamentum flavum infolding. Moderate to marked canal stenosis. Moderate to marked foraminal stenosis. L5-S1: Disc bulge with endplate osteophytic ridging and  moderate facet arthropathy. No canal stenosis. Moderate to marked foraminal stenosis. IMPRESSION: Motion degraded study with suboptimal evaluation. Diffuse osseous metastatic disease. Mild to moderate compression deformity of T3 with mild ventral epidural extension. No cord compression. Mild compression deformity of L4 with ventral epidural extension resulting in severe canal stenosis and crowding of cauda equina. Multilevel degenerative changes, greatest at lumbar levels with significant canal stenosis at L3-L4 and L4-L5. These results were called by telephone at the time of interpretation on 06/27/2019 at 6:21 pm to provider Dr. Vanita Panda, who verbally acknowledged these results. Electronically Signed   By: Macy Mis M.D.   On: 06/27/2019 18:22   CT Angio Chest/Abd/Pel for Dissection W and/or Wo Contrast  Result Date: 06/27/2019 CLINICAL DATA:  79 year old male with history of severe lower abdominal and back pain. EXAM: CT ANGIOGRAPHY CHEST, ABDOMEN AND PELVIS TECHNIQUE: Non-contrast CT of the chest was initially obtained. Multidetector CT imaging through the chest, abdomen and pelvis was performed using the standard protocol during bolus administration of intravenous contrast. Multiplanar reconstructed images and MIPs were obtained and reviewed to evaluate the vascular anatomy. CONTRAST:  14mL OMNIPAQUE IOHEXOL 350 MG/ML SOLN COMPARISON:  CT the abdomen and pelvis 04/20/2012. FINDINGS: CTA CHEST FINDINGS Cardiovascular: Precontrast images demonstrate no crescentic high attenuation associated with the wall of the thoracic aorta to suggest acute intramural hematoma. No aneurysm or dissection of the thoracic aorta. 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 Mid thoracic aortic arch measures 2.9 cm in diameter. Descending thoracic aorta measures 2.8 cm in diameter. Heart size is normal. There is no significant pericardial fluid, thickening or pericardial calcification. There is aortic  atherosclerosis, as well as atherosclerosis of the great vessels of  the mediastinum and the coronary arteries, including calcified atherosclerotic plaque in the left main, left anterior descending, left circumflex and right coronary arteries. Mediastinum/Nodes: Enlarged AP window lymph node measuring 1.7 cm in short axis. Borderline enlarged left hilar lymph nodes measuring up to 1.3 cm in short axis. Small hiatal hernia. No axillary lymphadenopathy. Lungs/Pleura: Multiple pulmonary nodules are noted, largest of which is in the left upper lobe (axial image 63 of series 7 and sagittal image 141 of series 10) measuring 2.5 x 1.6 x 1.5 cm. This lesion has macrolobulated slightly ill-defined margins. Several other smaller pulmonary nodules are noted including a 7 x 4 mm left upper lobe nodule (axial image 65 of series 7), and a 1.0 x 0.5 cm nodule in the superior segment of the right lower lobe (axial image 44 of series 7). No acute consolidative airspace disease. No pleural effusions. Musculoskeletal: There are no aggressive appearing lytic or blastic lesions noted in the visualized portions of the skeleton. Review of the MIP images confirms the above findings. CTA ABDOMEN AND PELVIS FINDINGS VASCULAR Aorta: Normal caliber aorta without aneurysm, dissection, vasculitis or significant stenosis. Extensive atherosclerotic disease throughout the abdominal aorta. Celiac: Patent without evidence of dissection, vasculitis or significant stenosis. Mild ectasia of the proximal celiac axis which measures 8 mm in diameter. SMA: Patent without evidence of aneurysm, dissection, vasculitis or significant stenosis. Renals: Both renal arteries are patent without evidence of aneurysm, dissection, vasculitis, fibromuscular dysplasia or significant stenosis. IMA: Patent without evidence of aneurysm, dissection, vasculitis or significant stenosis. Inflow: Patent without evidence of aneurysm, dissection, vasculitis or significant stenosis.  Veins: No obvious venous abnormality within the limitations of this arterial phase study. Review of the MIP images confirms the above findings. NON-VASCULAR Hepatobiliary: 1.2 cm low-attenuation lesion in segment 2 of the liver, compatible with a simple cyst. No other larger more suspicious appearing pulmonary nodules or masses are noted. No intra or extrahepatic biliary ductal dilatation. Gallbladder is normal in appearance. Pancreas: No pancreatic mass. No pancreatic ductal dilatation. No pancreatic or peripancreatic fluid collections or inflammatory changes. Spleen: Unremarkable. Adrenals/Urinary Tract: Subcentimeter low-attenuation lesion in the posterior aspect of the interpolar region of the left kidney, too small to characterize, but statistically likely to represent a tiny cyst. Right kidney and bilateral adrenal glands are normal in appearance. No hydroureteronephrosis. Urinary bladder is normal in appearance. Stomach/Bowel: Normal appearance of the stomach. No pathologic dilatation of small bowel or colon. Numerous colonic diverticulae are noted, without surrounding inflammatory changes to suggest an acute diverticulitis at this time. The appendix is not confidently identified and may be surgically absent. Regardless, there are no inflammatory changes noted adjacent to the cecum to suggest the presence of an acute appendicitis at this time. Lymphatic: No lymphadenopathy noted in the abdomen or pelvis. Reproductive: Fiducial markers in the prostate gland. Other: No significant volume of ascites.  No pneumoperitoneum. Musculoskeletal: There are no aggressive appearing lytic or blastic lesions noted in the visualized portions of the skeleton. Review of the MIP images confirms the above findings. IMPRESSION: 1. No acute findings are noted in the chest, abdomen or pelvis to account for the patient's history of abdominal pain. 2. Multiple pulmonary nodules in the lungs bilaterally, largest of which is a left  upper lobe pulmonary nodule measuring 2.5 x 1.6 x 1.5 cm. This is associated with left hilar and AP window lymphadenopathy. 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 Further evaluation  with PET-CT is recommended in the near future. No definite signs of metastatic disease in the abdomen or pelvis. 3. Colonic diverticulosis without evidence of acute diverticulitis at this time. 4. Aortic atherosclerosis, in addition to left main and 3 vessel coronary artery disease. Assessment for potential risk factor modification, dietary therapy or pharmacologic therapy may be warranted, if clinically indicated. 5. 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 Recommend annual imaging followup by CTA or MRA. This recommendation follows 2010 ACCF/AHA/AATS/ACR/ASA/SCA/SCAI/SIR/STS/SVM Guidelines for the Diagnosis and Management of Patients with Thoracic Aortic Disease. Circulation. 2010; 121: X381-W299. Aortic aneurysm NOS (ICD10-I71.9). 6. Additional incidental findings, as above. Electronically Signed   By: Vinnie Langton M.D.   On: 06/27/2019 12:26     Time Spent in minutes  30     Desiree Hane M.D on 06/29/2019 at 10:04 PM  To page go to [email or web address] - password [facility]

## 2019-06-29 NOTE — Progress Notes (Signed)
Patient is complaining of increased weakness/sensation to right side of his body at this time.  More so in his rt leg than rt. arm. Patient has excellent pulses all the way down and movement(plantar flexion, dorsal flexion).  He complains of decreased sensation all the way down his right side and reports difficulty feeling touch (w/varying pressures).  Patient's speech and mentation WNL.  Sensation seems to be only issue here. On call hospitalist notified and is coming to evaluate patient.Roderick Pee

## 2019-06-30 ENCOUNTER — Other Ambulatory Visit: Payer: Self-pay | Admitting: Pulmonary Disease

## 2019-06-30 DIAGNOSIS — E039 Hypothyroidism, unspecified: Secondary | ICD-10-CM

## 2019-06-30 DIAGNOSIS — M5442 Lumbago with sciatica, left side: Secondary | ICD-10-CM | POA: Diagnosis not present

## 2019-06-30 DIAGNOSIS — C61 Malignant neoplasm of prostate: Secondary | ICD-10-CM | POA: Diagnosis not present

## 2019-06-30 DIAGNOSIS — M5441 Lumbago with sciatica, right side: Secondary | ICD-10-CM

## 2019-06-30 DIAGNOSIS — R918 Other nonspecific abnormal finding of lung field: Secondary | ICD-10-CM | POA: Diagnosis not present

## 2019-06-30 LAB — BASIC METABOLIC PANEL
Anion gap: 10 (ref 5–15)
BUN: 15 mg/dL (ref 8–23)
CO2: 23 mmol/L (ref 22–32)
Calcium: 8.7 mg/dL — ABNORMAL LOW (ref 8.9–10.3)
Chloride: 97 mmol/L — ABNORMAL LOW (ref 98–111)
Creatinine, Ser: 0.66 mg/dL (ref 0.61–1.24)
GFR calc Af Amer: >60 mL/min (ref >60)
GFR calc non Af Amer: >60 mL/min (ref >60)
Glucose, Bld: 153 mg/dL — ABNORMAL HIGH (ref 70–99)
Potassium: 3.9 mmol/L (ref 3.5–5.1)
Sodium: 130 mmol/L — ABNORMAL LOW (ref 135–145)

## 2019-06-30 LAB — CBC
HCT: 40.5 % (ref 39.0–52.0)
Hemoglobin: 13.6 g/dL (ref 13.0–17.0)
MCH: 31.5 pg (ref 26.0–34.0)
MCHC: 33.6 g/dL (ref 30.0–36.0)
MCV: 93.8 fL (ref 80.0–100.0)
Platelets: 196 10*3/uL (ref 150–400)
RBC: 4.32 MIL/uL (ref 4.22–5.81)
RDW: 12.5 % (ref 11.5–15.5)
WBC: 9.9 10*3/uL (ref 4.0–10.5)
nRBC: 0 % (ref 0.0–0.2)

## 2019-06-30 MED ORDER — CHLORHEXIDINE GLUCONATE CLOTH 2 % EX PADS
6.0000 | MEDICATED_PAD | Freq: Every day | CUTANEOUS | Status: DC
  Administered 2019-07-01 – 2019-07-06 (×6): 6 via TOPICAL

## 2019-06-30 MED ORDER — GABAPENTIN 300 MG PO CAPS
300.0000 mg | ORAL_CAPSULE | Freq: Three times a day (TID) | ORAL | Status: DC
  Administered 2019-06-30 – 2019-07-03 (×8): 300 mg via ORAL
  Filled 2019-06-30 (×8): qty 1

## 2019-06-30 NOTE — Progress Notes (Signed)
NAME:  Keith Olson., MRN:  [id number], DOB:  11/12/1940, LOS: 3 ADMISSION DATE:  06/27/2019, CONSULTATION DATE: April 30 REFERRING MD: Dr. Lonny Prude, CHIEF COMPLAINT: Leg weakness  Brief History   79 year old male with a past medical history significant for prostate cancer presented with cauda equina syndrome, also found to have an abnormal nodule in his left upper lobe worrisome for bronchogenic carcinoma.  Pulmonary and critical care medicine consulted for further evaluation.  History of present illness   See details above.  This is a pleasant 79 year old male who in 2016 was diagnosed with prostate cancer which was treated with hormonal therapy and external beam radiation therapy.  He also has a history of coronary disease and recently underwent a left heart catheterization a few weeks ago which showed patent stents and medical management was recommended.  He has noted abnormal back pain and pain in his pelvis for several months.  Unfortunately he recently developed trouble urinating and having bowel movements so he presented to the hospital for further evaluation.  He was noted to have leg weakness and imaging studies revealed evidence of spinal metastatic disease.  A CT scan of his chest showed metastatic nodules throughout his lung with one particularly large nodule near a bronchus in the left upper lobe.  He has never smoked cigarettes.  He denies shortness of breath, recent cough or hemoptysis.  Pulmonary and critical care medicine was consulted for further evaluation.  Since admission to the hospital he has started external beam radiation therapy.  Past Medical History   Past Medical History:  Diagnosis Date  . Agent orange exposure   . Arthritis   . Coronary artery disease    BMS to mid LAD 2001, DES to proximal LAD 2017  . Enlarged prostate    XRT 2016  . Headache    Sinus headaches   . History of depression   . Hyperlipidemia   . Hypothyroidism   . Prostate cancer ([place]) 07/2014    hormonal therapy, external beam radiation therapy  . PTSD (post-traumatic stress disorder)    Significant Hospital Events    Consults:  Radiation Oncology Neurosurgery  Pulmonary   Procedures:    Significant Diagnostic Tests:  April 30 CT angiogram chest showed no pulmonary embolism, multiple pulmonary nodules with the largest being 2.5 x 1.6 x 1.5 in the left upper lobe.  Left hilar and AP window lymphadenopathy also noted.  Findings are highly concerning for bronchogenic carcinoma.  Micro Data:  None  Antimicrobials:  None  Interim history/subjective:   Has no complaints today or concerns  Objective   Blood pressure (!) 163/86, pulse 76, temperature 98 F (36.7 C), temperature source Oral, resp. rate 18, height 5\' 8"  (1.727 m), weight 65.8 kg, SpO2 98 %.        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  History of prostate cancer -Post radiation treatment in 2016 -PSA is elevated to 39 -This may be the source of his metastatic deposits  Hyponatremia -Monitor  Hypothyroidism -On Synthroid  BPH  Coronary artery disease  Labs   CBC: Recent Labs  Lab 06/27/19 1107 06/29/19 0456 06/30/19 0549  WBC 8.4 13.2* 9.9  NEUTROABS 6.6  --   --   HGB 14.6 13.3 13.6  HCT 43.5 39.8 40.5  MCV  94.4 94.3 93.8  PLT 244 202 124    Basic Metabolic Panel: Recent Labs  Lab 06/27/19 1107 06/29/19 0456 06/30/19 0549  NA 135 129* 130*  K 4.2 4.1 3.9  CL 98 96* 97*  CO2 23 25 23   GLUCOSE 137* 121* 153*  BUN 17 20 15   CREATININE 0.61 0.73 0.66  CALCIUM 9.6 8.7* 8.7*   GFR: Estimated Creatinine Clearance: 72 mL/min (by C-G formula based on SCr of 0.66 mg/dL). Recent Labs  Lab 06/27/19 1107 06/29/19 0456 06/30/19 0549  WBC 8.4 13.2* 9.9    Liver Function Tests: Recent Labs  Lab 06/27/19 1107  AST 28  ALT 23  ALKPHOS 306*  BILITOT 0.7  PROT 7.7  ALBUMIN 4.5   No results for input(s): LIPASE, AMYLASE in the last 168 hours. No results for input(s): AMMONIA in the last 168 hours.  ABG No results found for: PHART, PCO2ART, PO2ART, HCO3, TCO2, ACIDBASEDEF, O2SAT   Coagulation Profile: No results for input(s): INR, PROTIME in the last 168 hours.  Cardiac Enzymes: No results for input(s): CKTOTAL, CKMB, CKMBINDEX, TROPONINI in the last 168 hours.  HbA1C: Hgb A1c MFr Bld  Date/Time Value Ref Range Status  05/03/2019 10:59 PM 5.7 (H) 4.8 - 5.6 % Final    Comment:    (NOTE) Pre diabetes:          5.7%-6.4% Diabetes:              >6.4% Glycemic control for   <7.0% adults with diabetes     CBG: No results for input(s): GLUCAP in the last 168 hours.  Review of Systems:   Back pain and discomfort is better Respiratory status is stable  Past Medical History  He,  has a past medical history of Agent orange exposure, Arthritis, Coronary artery disease, Enlarged prostate, Headache, History of depression, Hyperlipidemia, Hypothyroidism, Prostate cancer ([place]) (07/2014), and PTSD (post-traumatic stress disorder).   Surgical History    Past Surgical History:  Procedure Laterality Date  . CARDIAC CATHETERIZATION N/A 12/12/2015   Procedure: Left Heart Cath and Coronary Angiography;  Surgeon: Peter M Martinique, MD;  Location: [place] [facility];  Service:  Cardiovascular;  Laterality: N/A;  . CARDIAC CATHETERIZATION N/A 12/12/2015   Procedure: Coronary Stent Intervention;  Surgeon: Peter M Martinique, MD;  Location: [place] [facility];  Service: Cardiovascular;  Laterality: N/A;  . CORONARY STENT INTERVENTION N/A 09/06/2017   Procedure: CORONARY STENT INTERVENTION;  Surgeon: Burnell Blanks, MD;  Location: [place] [facility];  Service: Cardiovascular;  Laterality: N/A;  . HERNIA REPAIR Right   . LEFT HEART CATH AND CORONARY ANGIOGRAPHY N/A 09/02/2016   Procedure: Left Heart Cath and Coronary Angiography;  Surgeon: Leonie Man, MD;  Location: [place] [facility];  Service: Cardiovascular;  Laterality: N/A;  . LEFT HEART CATH AND CORONARY ANGIOGRAPHY N/A 09/06/2017   Procedure: LEFT HEART CATH AND CORONARY ANGIOGRAPHY;  Surgeon: Burnell Blanks, MD;  Location: [place] [facility];  Service: Cardiovascular;  Laterality: N/A;  . LEFT HEART CATH AND CORONARY ANGIOGRAPHY N/A 05/04/2019   Procedure: LEFT HEART CATH AND CORONARY ANGIOGRAPHY;  Surgeon: Martinique, Peter M, MD;  Location: [place] [facility];  Service: Cardiovascular;  Laterality: N/A;  . PROSTATE BIOPSY N/A 08/28/2014   Procedure: BIOPSY TRANSRECTAL ULTRASONIC PROSTATE (TUBP);  Surgeon: Rana Snare, MD;  Location: [facility];  Service: Urology;  Laterality: N/A;  . TRANSURETHRAL RESECTION OF PROSTATE N/A 08/28/2014   Procedure: TRANSURETHRAL RESECTION OF THE PROSTATE WITH GYRUS INSTRUMENTS;  Surgeon: Rana Snare, MD;  Location: [facility];  Service: Urology;  Laterality: N/A;     Social History   reports that he has never smoked. He has quit using smokeless tobacco.  His smokeless tobacco use included chew. He reports that he does not drink alcohol or use drugs.   Family History   His family history includes Arthritis in his mother; Heart attack in his father.   Allergies Allergies  Allergen Reactions  . Lipitor [Atorvastatin]     Muscles aches    Sherrilyn Rist, MD [facility]  Pager: [phone]

## 2019-06-30 NOTE — Plan of Care (Signed)
Discussed with patient plan of care for the evening, pain management and urination issues with some teach back displayed.  Talked about why is refusing his Flomax medication and discussed how it works.  He stated it doesn't work for him.  Told patient to discuss with his MD tomorrow.

## 2019-06-30 NOTE — Progress Notes (Signed)
PROGRESS NOTE    Rynell Toniann Fail.  GUY:[id number] DOB: 1940-06-13 DOA: 06/27/2019 PCP: Dettinger, Fransisca Kaufmann, MD    Brief Narrative:  79 year old retired [place] [employer] who served in [employer] with a history of ischemic cardiomyopathy,prostate cancer status post radiation therapy (2016) who presented on 06/27/2019 with ongoing lower back and pelvic area pain for 6 months that acutely worsened over the past 2 weeks.  He was was previously treated by his PCP with Vicodin with no improvement in pain and recommended to go to ED.  Patient presented to [facility], CT of chest abdomen pelvis was negative for PE new mass concerning for bronchogenic carcinoma lymph node involvement.  MRI cervical/thoracic/lumbar spine showed extensive metastatic osseous lesions with T3 fracture but no cord compression, as well as L4 fracture with severe spinal stenosis and crowding of cauda equina.  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   -recurrence of prostate cancer (PSA quite elevated )with metastasis VS new bronchogenic carcinoma (given large lung nodule on CT chest imaging ).    - Not a candidate for surgery based off neurosurgery evaluation given all levels of the lumbar spine infiltrated with tumor and no urgent need for decompression with no red flag symptoms currently ( no  weakness, bowel/urine incontinence).  Started emergent radiation on 4/29  -Oncology consulted, -Currently undergoing palliative r radiotherapy by radiation oncology -IV Decadron 4 mg twice daily -Plan for bicalutamide 50 mg daily x 28 days with lupron shots to start at approximately 14 days into treatment (likely to be administered in outpatient setting).   #Acute back pain, severe.   -Dilaudid 2 mg every 2 hours for severe pain -Schedule IV Robaxin 1 mg every 8 hours for 3 days -Continue Norco 1-2 tabs 10/325 mg every 6 hours as needed moderate pain -Gabapentin.  #Multiple pulmonary nodules.  Largest in left upper lobe measuring 2 x 5 by 1 x 6 x 1.5 cm, macrolobulated and associated with left hilar lymphadenopathy.  Findings highly concerning for primary bronchogenic carcinoma with metastatic disease to the lymph nodes and lungs in the thorax -Normal respiratory effort on room air -Oncology consulted -Bronch with EUS arranged for 07/02/19.   #Hypothyroidism, stable -continue Synthroid 75 mcg daily  #GERD, stable -continue Protonix 20 mg daily  #Hyperlipidemia, stable -Continue simvastatin  #BPH, stable -Continue Flomax  #CAD, stable.  Asymptomatic.  Previously treated stents -Continue aspirin   DVT prophylaxis: Lovenox   code Status: Full code Family Communication: None   status is: Inpatient  Remains inpatient appropriate because:Inpatient level of care appropriate due to severity of illness   Dispo: The patient is from: Home              Anticipated d/c is to: SNF              Anticipated d/c date is: > 3 days  Patient currently is not medically stable to d/c.          Consultants:  Radiation oncology, critical care/pulmonary, oncology.  Procedures: Radiation to spine, bronchoscopy with endoscopic ultrasound and biopsy  Antimicrobials: None  Subjective: Patient has no new complaints.  Reports pain is reasonably  controlled.  Objective: Vitals:   06/29/19 1651 06/29/19 2153 06/30/19 0527 06/30/19 1328  BP: (!) 142/73 (!) 161/76 (!) 163/86 131/68  Pulse: 63 61 76 69  Resp: 18 16 18 18   Temp: 97.9 F (36.6 C) 98.3 F (36.8 C) 98 F (36.7 C) (!) 97 F (36.1 C)  TempSrc: Oral Oral Oral   SpO2: 92% 93% 98% 94%  Weight:      Height:        Intake/Output Summary (Last 24 hours) at 06/30/2019 1853 Last data filed at 06/30/2019 1644 Gross per 24 hour  Intake 2564.86 ml  Output 4125 ml  Net -1560.14 ml   Filed Weights   06/27/19 1028  Weight: 65.8 kg    Examination:  General exam: Appears calm and comfortable no apparent distress.  Looks younger for age. Respiratory system: Clear to auscultation. Respiratory effort normal. Cardiovascular system: S1 & S2 heard, RRR. No JVD, murmurs, rubs, gallops or clicks. No pedal edema. Gastrointestinal system: Abdomen is nondistended, soft and nontender. No organomegaly or masses felt. Normal bowel sounds heard. Central nervous system: Alert and oriented. No focal neurological deficits. Extremities: Symmetric 5 x 5 power. Skin: No rashes, lesions or ulcers Psychiatry: Judgement and insight appear normal. Mood & affect appropriate.     Data Reviewed: I have personally reviewed following labs and imaging studies  CBC: Recent Labs  Lab 06/27/19 1107 06/29/19 0456 06/30/19 0549  WBC 8.4 13.2* 9.9  NEUTROABS 6.6  --   --   HGB 14.6 13.3 13.6  HCT 43.5 39.8 40.5  MCV 94.4 94.3 93.8  PLT 244 202 751   Basic Metabolic Panel: Recent Labs  Lab 06/27/19 1107 06/29/19 0456 06/30/19 0549  NA 135 129* 130*  K 4.2 4.1 3.9  CL 98 96* 97*  CO2 23 25 23   GLUCOSE 137* 121* 153*  BUN 17 20 15   CREATININE 0.61 0.73 0.66  CALCIUM 9.6 8.7* 8.7*   GFR: Estimated Creatinine Clearance: 72 mL/min (by C-G formula based on SCr of 0.66 mg/dL). Liver Function Tests: Recent Labs  Lab 06/27/19 1107  AST 28  ALT 23  ALKPHOS 306*  BILITOT 0.7  PROT 7.7   ALBUMIN 4.5   No results for input(s): LIPASE, AMYLASE in the last 168 hours. No results for input(s): AMMONIA in the last 168 hours. Coagulation Profile: No results for input(s): INR, PROTIME in the last 168 hours. Cardiac Enzymes: No results for input(s): CKTOTAL, CKMB, CKMBINDEX, TROPONINI in the last 168 hours. BNP (last 3 results) No results for input(s): PROBNP in the last 8760 hours. HbA1C: No results for input(s): HGBA1C in the last 72 hours. CBG: No results for input(s): GLUCAP in the last 168 hours. Lipid Profile: No results for input(s): CHOL, HDL, LDLCALC, TRIG, CHOLHDL, LDLDIRECT in the last 72 hours. Thyroid Function Tests: No results for input(s): TSH, T4TOTAL, FREET4, T3FREE, THYROIDAB in the last 72 hours. Anemia Panel: No results for input(s): VITAMINB12, FOLATE, FERRITIN, TIBC, IRON, RETICCTPCT in the last 72 hours. Sepsis Labs: No results for input(s): PROCALCITON, LATICACIDVEN in the last 168 hours.  Recent Results (from the past 240 hour(s))  Respiratory Panel by RT PCR (Flu A&B, Covid) - Nasopharyngeal Swab  Status: None   Collection Time: 06/27/19  8:23 PM   Specimen: Nasopharyngeal Swab  Result Value Ref Range Status   SARS Coronavirus 2 by RT PCR NEGATIVE NEGATIVE Final    Comment: (NOTE) SARS-CoV-2 target nucleic acids are NOT DETECTED. The SARS-CoV-2 RNA is generally detectable in upper respiratoy specimens during the acute phase of infection. The lowest concentration of SARS-CoV-2 viral copies this assay can detect is 131 copies/mL. A negative result does not preclude SARS-Cov-2 infection and should not be used as the sole basis for treatment or other patient management decisions. A negative result may occur with  improper specimen collection/handling, submission of specimen other than nasopharyngeal swab, presence of viral mutation(s) within the areas targeted by this assay, and inadequate number of viral copies (<131 copies/mL). A negative  result must be combined with clinical observations, patient history, and epidemiological information. The expected result is Negative. Fact Sheet for Patients:  PinkCheek.be Fact Sheet for Healthcare Providers:  GravelBags.it This test is not yet ap proved or cleared by the [place] FDA and  has been authorized for detection and/or diagnosis of SARS-CoV-2 by FDA under an Emergency Use Authorization (EUA). This EUA will remain  in effect (meaning this test can be used) for the duration of the COVID-19 declaration under Section 564(b)(1) of the Act, 21 U.S.C. section 360bbb-3(b)(1), unless the authorization is terminated or revoked sooner.    Influenza A by PCR NEGATIVE NEGATIVE Final   Influenza B by PCR NEGATIVE NEGATIVE Final    Comment: (NOTE) The Xpert Xpress SARS-CoV-2/FLU/RSV assay is intended as an aid in  the diagnosis of influenza from Nasopharyngeal swab specimens and  should not be used as a sole basis for treatment. Nasal washings and  aspirates are unacceptable for Xpert Xpress SARS-CoV-2/FLU/RSV  testing. Fact Sheet for Patients: PinkCheek.be Fact Sheet for Healthcare Providers: GravelBags.it This test is not yet approved or cleared by the [place] FDA and  has been authorized for detection and/or diagnosis of SARS-CoV-2 by  FDA under an Emergency Use Authorization (EUA). This EUA will remain  in effect (meaning this test can be used) for the duration of the  Covid-19 declaration under Section 564(b)(1) of the Act, 21  U.S.C. section 360bbb-3(b)(1), unless the authorization is  terminated or revoked. Performed at [facility], [address], [place],  [address]          Radiology Studies: No results found.      Scheduled Meds: . aspirin EC  81 mg Oral Daily  . bicalutamide  50 mg Oral Daily  . Chlorhexidine Gluconate Cloth   6 each Topical Daily  . dexamethasone (DECADRON) injection  4 mg Intravenous Q12H  . gabapentin  300 mg Oral TID  . heparin  5,000 Units Subcutaneous Q8H  . levothyroxine  75 mcg Oral QAC breakfast  . pantoprazole  40 mg Oral Daily  . rosuvastatin  40 mg Oral Daily  . tamsulosin  0.4 mg Oral QHS   Continuous Infusions: . methocarbamol (ROBAXIN) IV 1,000 mg (06/30/19 1428)     LOS: 3 days    Time spent: Greater than 35 minutes    Lennin Osmond, MD [facility]   To contact the attending provider between 7A-7P or the covering provider during after hours 7P-7A, please log into the web site [email or web address] and access using universal [place] password for that web site. If you do not have the password, please call the hospital operator.  06/30/2019, 6:53 PM

## 2019-07-01 DIAGNOSIS — R918 Other nonspecific abnormal finding of lung field: Secondary | ICD-10-CM | POA: Diagnosis not present

## 2019-07-01 DIAGNOSIS — C7951 Secondary malignant neoplasm of bone: Principal | ICD-10-CM

## 2019-07-01 DIAGNOSIS — M5442 Lumbago with sciatica, left side: Secondary | ICD-10-CM | POA: Diagnosis not present

## 2019-07-01 DIAGNOSIS — C801 Malignant (primary) neoplasm, unspecified: Secondary | ICD-10-CM

## 2019-07-01 DIAGNOSIS — C61 Malignant neoplasm of prostate: Secondary | ICD-10-CM | POA: Diagnosis not present

## 2019-07-01 NOTE — Plan of Care (Signed)
Discussed with patient plan of care for the evening, pain management and procedure for tomorrow with some teach back displayed.

## 2019-07-01 NOTE — H&P (View-Only) (Signed)
NAME:  Keith Amontae Ng., MRN:  [id number], DOB:  [birth year], LOS: 4 ADMISSION DATE:  06/27/2019, CONSULTATION DATE: April 30 REFERRING MD: Dr. Lonny Prude, CHIEF COMPLAINT: Leg weakness  Brief History   79 year old male with a past medical history significant for prostate cancer presented with cauda equina syndrome, also found to have an abnormal nodule in his left upper lobe worrisome for bronchogenic carcinoma.  Pulmonary and critical care medicine consulted for further evaluation.  History of present illness   See details above.  This is a pleasant 79 year old male who in 2016 was diagnosed with prostate cancer which was treated with hormonal therapy and external beam radiation therapy.  He also has a history of coronary disease and recently underwent a left heart catheterization a few weeks ago which showed patent stents and medical management was recommended.  He has noted abnormal back pain and pain in his pelvis for several months.  Unfortunately he recently developed trouble urinating and having bowel movements so he presented to the hospital for further evaluation.  He was noted to have leg weakness and imaging studies revealed evidence of spinal metastatic disease.  A CT scan of his chest showed metastatic nodules throughout his lung with one particularly large nodule near a bronchus in the left upper lobe.  He has never smoked cigarettes.  He denies shortness of breath, recent cough or hemoptysis.  Pulmonary and critical care medicine was consulted for further evaluation.  Since admission to the hospital he has started external beam radiation therapy.  Past Medical History   Past Medical History:  Diagnosis Date  . Agent orange exposure   . Arthritis   . Coronary artery disease    BMS to mid LAD 2001, DES to proximal LAD 2017  . Enlarged prostate    XRT 2016  . Headache    Sinus headaches   . History of depression   . Hyperlipidemia   . Hypothyroidism   . Prostate cancer ([place]) 07/2014    hormonal therapy, external beam radiation therapy  . PTSD (post-traumatic stress disorder)    Significant Hospital Events    Consults:  Radiation Oncology Neurosurgery  Pulmonary  Procedures:    Significant Diagnostic Tests:  April 30 CT angiogram chest showed no pulmonary embolism, multiple pulmonary nodules with the largest being 2.5 x 1.6 x 1.5 in the left upper lobe.  Left hilar and AP window lymphadenopathy also noted.  Findings are highly concerning for bronchogenic carcinoma.  Micro Data:  None  Antimicrobials:  None  Interim history/subjective:   Has no complaints today or concerns  Objective   Blood pressure 138/71, pulse 66, temperature 97.6 F (36.4 C), temperature source Oral, resp. rate 16, height 5\' 8"  (1.727 m), weight 65.8 kg, SpO2 91 %.        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 from midnight  Continue pain management   .  History of prostate cancer -Post radiation treatment in 2016 -PSA is elevated to 239 -This may be the source of his metastatic deposits  The likelihood of him having 2 primary malignancies is a possibility Metastatic prostate cancer and a lung primary  Hyponatremia -Monitor  Hypothyroidism -On Synthroid  BPH  Coronary  artery disease  Discussed with patient and family members today at bedside Questions about bronchoscopy answered  Labs   CBC: Recent Labs  Lab 06/27/19 1107 06/29/19 0456 06/30/19 0549  WBC 8.4 13.2* 9.9  NEUTROABS 6.6  --   --   HGB 14.6 13.3 13.6  HCT 43.5 39.8 40.5  MCV 94.4 94.3 93.8  PLT 244 202 814    Basic Metabolic Panel: Recent Labs  Lab 06/27/19 1107 06/29/19 0456 06/30/19 0549  NA 135 129* 130*  K 4.2 4.1 3.9  CL 98 96* 97*  CO2 23 25 23   GLUCOSE 137* 121* 153*  BUN 17 20 15   CREATININE 0.61 0.73 0.66  CALCIUM 9.6 8.7* 8.7*   GFR: Estimated Creatinine Clearance: 72 mL/min (by C-G formula based on SCr of 0.66 mg/dL). Recent Labs  Lab 06/27/19 1107 06/29/19 0456 06/30/19 0549  WBC 8.4 13.2* 9.9    Liver Function Tests: Recent Labs  Lab 06/27/19 1107  AST 28  ALT 23  ALKPHOS 306*  BILITOT 0.7  PROT 7.7  ALBUMIN 4.5   No results for input(s): LIPASE, AMYLASE in the last 168 hours. No results for input(s): AMMONIA in the last 168 hours.  ABG No results found for: PHART, PCO2ART, PO2ART, HCO3, TCO2, ACIDBASEDEF, O2SAT   Coagulation Profile: No results for input(s): INR, PROTIME in the last 168 hours.  Cardiac Enzymes: No results for input(s): CKTOTAL, CKMB, CKMBINDEX, TROPONINI in the last 168 hours.  HbA1C: Hgb A1c MFr Bld  Date/Time Value Ref Range Status  05/03/2019 10:59 PM 5.7 (H) 4.8 - 5.6 % Final    Comment:    (NOTE) Pre diabetes:          5.7%-6.4% Diabetes:              >6.4% Glycemic control for   <7.0% adults with diabetes     CBG: No results for input(s): GLUCAP in the last 168 hours.  Review of Systems:   Back pain and discomfort is better Respiratory status is stable  Past Medical History  He,  has a past medical history of Agent orange exposure, Arthritis, Coronary artery disease, Enlarged prostate, Headache, History of depression, Hyperlipidemia, Hypothyroidism, Prostate cancer ([place]) (07/2014), and PTSD  (post-traumatic stress disorder).   Surgical History    Past Surgical History:  Procedure Laterality Date  . CARDIAC CATHETERIZATION N/A 12/12/2015   Procedure: Left Heart Cath and Coronary Angiography;  Surgeon: Peter M Martinique, MD;  Location: [place] [facility];  Service: Cardiovascular;  Laterality: N/A;  . CARDIAC CATHETERIZATION N/A 12/12/2015   Procedure: Coronary Stent Intervention;  Surgeon: Peter M Martinique, MD;  Location: [place] [facility];  Service: Cardiovascular;  Laterality: N/A;  . CORONARY STENT INTERVENTION N/A 09/06/2017   Procedure: CORONARY STENT INTERVENTION;  Surgeon: Burnell Blanks, MD;  Location: [place] [facility];  Service: Cardiovascular;  Laterality: N/A;  . HERNIA REPAIR Right   . LEFT HEART CATH AND CORONARY ANGIOGRAPHY N/A 09/02/2016   Procedure: Left Heart Cath and Coronary Angiography;  Surgeon: Leonie Man, MD;  Location: [place] [facility];  Service: Cardiovascular;  Laterality: N/A;  . LEFT HEART CATH AND CORONARY ANGIOGRAPHY N/A 09/06/2017   Procedure: LEFT HEART CATH AND CORONARY ANGIOGRAPHY;  Surgeon: Burnell Blanks, MD;  Location: [place] [facility];  Service: Cardiovascular;  Laterality: N/A;  . LEFT HEART CATH AND CORONARY ANGIOGRAPHY N/A 05/04/2019   Procedure: LEFT HEART CATH AND CORONARY ANGIOGRAPHY;  Surgeon: Martinique, Peter M, MD;  Location: [place] [facility];  Service: Cardiovascular;  Laterality: N/A;  . PROSTATE BIOPSY N/A 08/28/2014   Procedure: BIOPSY TRANSRECTAL ULTRASONIC PROSTATE (TUBP);  Surgeon: Rana Snare, MD;  Location: [facility];  Service: Urology;  Laterality: N/A;  . TRANSURETHRAL RESECTION OF PROSTATE N/A 08/28/2014   Procedure: TRANSURETHRAL RESECTION OF THE PROSTATE WITH GYRUS INSTRUMENTS;  Surgeon: Rana Snare, MD;  Location: [facility];  Service: Urology;  Laterality: N/A;     Social History   reports that he has never smoked. He has quit using smokeless tobacco.  His smokeless tobacco use included chew. He reports  that he does not drink alcohol or use drugs.   Family History   His family history includes Arthritis in his mother; Heart attack in his father.   Allergies Allergies  Allergen Reactions  . Lipitor [Atorvastatin]     Muscles aches    Sherrilyn Rist, MD [facility] Pager: [phone]

## 2019-07-01 NOTE — Progress Notes (Signed)
PROGRESS NOTE    Keith Olson.  NOB:[id number] DOB: Jun 14, 1940 DOA: 06/27/2019 PCP: Dettinger, Fransisca Kaufmann, MD    Brief Narrative:  79 year old retired [place] [employer] who served in [employer] with a history of ischemic cardiomyopathy,prostate cancer status post radiation therapy (2016) who presented on 06/27/2019 with ongoing lower back and pelvic area pain for 6 months that acutely worsened over the past 2 weeks.  He was was previously treated by his PCP with Vicodin with no improvement in pain and recommended to go to ED.  Patient presented to [facility], CT of chest abdomen pelvis was negative for PE new mass concerning for bronchogenic carcinoma lymph node involvement.  MRI cervical/thoracic/lumbar spine showed extensive metastatic osseous lesions with T3 fracture but no cord compression, as well as L4 fracture with severe spinal stenosis and crowding of cauda equina.  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)   Acute bilateral low back pain with sciatica   Multiple lung nodules on CT   Hyponatremia   #Diffuse spinal infiltration with pathologic fractures (T3, L4)/severe lumbar stenosis with crowding of cauda equina without cord compression.   -recurrence of prostate cancer (PSA quite elevated )with metastasis VS new bronchogenic carcinoma (given large lung nodule on CT chest imaging ).    - Not a candidate for back surgery per neurosurgery evaluation given all levels of the lumbar spine infiltrated with tumor and no urgent need for decompression with no red flag symptoms currently ( no  weakness, bowel/urine incontinence).  Started emergent radiation on 4/29  -Currently undergoing palliative r radiotherapy by radiation oncology -IV Decadron 4 mg twice daily -Plan for bicalutamide 50 mg daily x 28 days with lupron shots to start at approximately 14 days into treatment (likely to be administered in outpatient setting).   #Acute back pain, severe.   -Dilaudid 2 mg every 2 hours for severe pain -Schedule IV Robaxin 1 mg every 8 hours for 3 days -Continue Norco 1-2 tabs 10/325 mg every 6 hours as needed moderate pain -Gabapentin.  #Multiple pulmonary nodules.  Largest in left upper lobe measuring 2 x 5 by 1 x 6 x 1.5 cm, macrolobulated and associated with left hilar lymphadenopathy.  Findings highly concerning for primary bronchogenic carcinoma with metastatic disease to the lymph nodes and lungs in the thorax -Normal respiratory effort on room air -Oncology consulted -Bronch with EUS arranged for 07/02/19.NPO tonight.    #Hypothyroidism, stable -continue Synthroid 75 mcg daily  #GERD, stable -continue Protonix 20 mg daily  #Hyperlipidemia, stable -Continue simvastatin  #BPH, stable -Continue Flomax  #CAD, stable.  Asymptomatic.  Previously treated stents -Continue aspirin   DVT prophylaxis: Lovenox   code Status: Full code Family Communication: None   status is: Inpatient  Remains inpatient appropriate because:Inpatient level of care appropriate due to severity of illness   Dispo: The patient is from: Home              Anticipated d/c is to: SNF              Anticipated d/c date is: > 3 days  Patient currently is not medically stable to d/c.          Consultants:  Radiation oncology, critical care/pulmonary, oncology.  Procedures: Radiation to spine, bronchoscopy with endoscopic ultrasound and biopsy  Antimicrobials: None  Subjective: Patient has no new complaints.  Reports pain is reasonably  controlled.  Objective: Vitals:   06/30/19 1328 06/30/19 2111 07/01/19 0540 07/01/19 1245  BP: 131/68 (!) 159/81 138/71 (!) 146/78  Pulse: 69 62 66 61  Resp: 18 18 16 16   Temp: (!) 97 F (36.1 C) 97.7 F (36.5 C) 97.6 F (36.4 C) 97.7 F (36.5 C)  TempSrc:  Oral Oral Oral  SpO2: 94% 94% 91% 96%  Weight:      Height:        Intake/Output Summary (Last 24 hours) at 07/01/2019 1423 Last data filed at 07/01/2019 0115 Gross per 24 hour  Intake 532.24 ml  Output 2500 ml  Net -1967.76 ml   Filed Weights   06/27/19 1028  Weight: 65.8 kg    Examination:  General exam: Appears calm and comfortable no apparent distress.  Looks younger for age. Respiratory system: Clear to auscultation. Respiratory effort normal. Cardiovascular system: S1 & S2 heard, RRR. No JVD, murmurs, rubs, gallops or clicks. No pedal edema. Gastrointestinal system: Abdomen is nondistended, soft and nontender. No organomegaly or masses felt. Normal bowel sounds heard. Central nervous system: Alert and oriented. No focal neurological deficits. Extremities: Symmetric 5 x 5 power. Skin: No rashes, lesions or ulcers Psychiatry: Judgement and insight appear normal. Mood & affect appropriate.     Data Reviewed: I have personally reviewed following labs and imaging studies  CBC: Recent Labs  Lab 06/27/19 1107 06/29/19 0456 06/30/19 0549  WBC 8.4 13.2* 9.9  NEUTROABS 6.6  --   --   HGB 14.6 13.3 13.6  HCT 43.5 39.8 40.5  MCV 94.4 94.3 93.8  PLT 244 202 103   Basic Metabolic Panel: Recent Labs  Lab 06/27/19 1107 06/29/19 0456 06/30/19 0549  NA 135 129* 130*  K 4.2 4.1 3.9  CL 98 96* 97*  CO2 23 25 23   GLUCOSE 137* 121* 153*  BUN 17 20 15   CREATININE 0.61 0.73 0.66  CALCIUM 9.6 8.7* 8.7*   GFR: Estimated Creatinine Clearance: 72 mL/min (by C-G formula based on SCr of 0.66 mg/dL). Liver Function Tests: Recent Labs  Lab 06/27/19 1107  AST 28  ALT 23  ALKPHOS 306*  BILITOT 0.7  PROT 7.7   ALBUMIN 4.5   No results for input(s): LIPASE, AMYLASE in the last 168 hours. No results for input(s): AMMONIA in the last 168 hours. Coagulation Profile: No results for input(s): INR, PROTIME in the last 168 hours. Cardiac Enzymes: No results for input(s): CKTOTAL, CKMB, CKMBINDEX, TROPONINI in the last 168 hours. BNP (last 3 results) No results for input(s): PROBNP in the last 8760 hours. HbA1C: No results for input(s): HGBA1C in the last 72 hours. CBG: No results for input(s): GLUCAP in the last 168 hours. Lipid Profile: No results for input(s): CHOL, HDL, LDLCALC, TRIG, CHOLHDL, LDLDIRECT in the last 72 hours. Thyroid Function Tests: No results for input(s): TSH, T4TOTAL, FREET4, T3FREE, THYROIDAB in the last 72 hours. Anemia Panel: No results for input(s): VITAMINB12, FOLATE, FERRITIN, TIBC, IRON, RETICCTPCT in the last 72 hours. Sepsis Labs: No results for input(s): PROCALCITON, LATICACIDVEN in the last 168 hours.  Recent Results (from the past 240 hour(s))  Respiratory Panel by RT PCR (Flu A&B, Covid) - Nasopharyngeal Swab  Status: None   Collection Time: 06/27/19  8:23 PM   Specimen: Nasopharyngeal Swab  Result Value Ref Range Status   SARS Coronavirus 2 by RT PCR NEGATIVE NEGATIVE Final    Comment: (NOTE) SARS-CoV-2 target nucleic acids are NOT DETECTED. The SARS-CoV-2 RNA is generally detectable in upper respiratoy specimens during the acute phase of infection. The lowest concentration of SARS-CoV-2 viral copies this assay can detect is 131 copies/mL. A negative result does not preclude SARS-Cov-2 infection and should not be used as the sole basis for treatment or other patient management decisions. A negative result may occur with  improper specimen collection/handling, submission of specimen other than nasopharyngeal swab, presence of viral mutation(s) within the areas targeted by this assay, and inadequate number of viral copies (<131 copies/mL). A negative  result must be combined with clinical observations, patient history, and epidemiological information. The expected result is Negative. Fact Sheet for Patients:  PinkCheek.be Fact Sheet for Healthcare Providers:  GravelBags.it This test is not yet ap proved or cleared by the [place] FDA and  has been authorized for detection and/or diagnosis of SARS-CoV-2 by FDA under an Emergency Use Authorization (EUA). This EUA will remain  in effect (meaning this test can be used) for the duration of the COVID-19 declaration under Section 564(b)(1) of the Act, 21 U.S.C. section 360bbb-3(b)(1), unless the authorization is terminated or revoked sooner.    Influenza A by PCR NEGATIVE NEGATIVE Final   Influenza B by PCR NEGATIVE NEGATIVE Final    Comment: (NOTE) The Xpert Xpress SARS-CoV-2/FLU/RSV assay is intended as an aid in  the diagnosis of influenza from Nasopharyngeal swab specimens and  should not be used as a sole basis for treatment. Nasal washings and  aspirates are unacceptable for Xpert Xpress SARS-CoV-2/FLU/RSV  testing. Fact Sheet for Patients: PinkCheek.be Fact Sheet for Healthcare Providers: GravelBags.it This test is not yet approved or cleared by the [place] FDA and  has been authorized for detection and/or diagnosis of SARS-CoV-2 by  FDA under an Emergency Use Authorization (EUA). This EUA will remain  in effect (meaning this test can be used) for the duration of the  Covid-19 declaration under Section 564(b)(1) of the Act, 21  U.S.C. section 360bbb-3(b)(1), unless the authorization is  terminated or revoked. Performed at [facility], [address] [place],  [address]          Radiology Studies: No results found.      Scheduled Meds: . aspirin EC  81 mg Oral Daily  . bicalutamide  50 mg Oral Daily  . Chlorhexidine Gluconate Cloth   6 each Topical Daily  . dexamethasone (DECADRON) injection  4 mg Intravenous Q12H  . gabapentin  300 mg Oral TID  . heparin  5,000 Units Subcutaneous Q8H  . levothyroxine  75 mcg Oral QAC breakfast  . pantoprazole  40 mg Oral Daily  . rosuvastatin  40 mg Oral Daily  . tamsulosin  0.4 mg Oral QHS   Continuous Infusions:    LOS: 4 days    Time spent: Greater than 35 minutes    Marcellus Pulliam, MD [facility]   To contact the attending provider between 7A-7P or the covering provider during after hours 7P-7A, please log into the web site [email or web address] and access using universal [place] password for that web site. If you do not have the password, please call the hospital operator.  07/01/2019, 2:23 PM

## 2019-07-01 NOTE — Plan of Care (Signed)
Discussed with patient plan of care for the evening, pain management and the importance of taking the flomax with some teach back displayed.

## 2019-07-01 NOTE — Progress Notes (Signed)
NAME:  Keith Yunior Jain., MRN:  [id number], DOB:  [birth year], LOS: 4 ADMISSION DATE:  06/27/2019, CONSULTATION DATE: April 30 REFERRING MD: Dr. Lonny Prude, CHIEF COMPLAINT: Leg weakness  Brief History   79 year old male with a past medical history significant for prostate cancer presented with cauda equina syndrome, also found to have an abnormal nodule in his left upper lobe worrisome for bronchogenic carcinoma.  Pulmonary and critical care medicine consulted for further evaluation.  History of present illness   See details above.  This is a pleasant 79 year old male who in 2016 was diagnosed with prostate cancer which was treated with hormonal therapy and external beam radiation therapy.  He also has a history of coronary disease and recently underwent a left heart catheterization a few weeks ago which showed patent stents and medical management was recommended.  He has noted abnormal back pain and pain in his pelvis for several months.  Unfortunately he recently developed trouble urinating and having bowel movements so he presented to the hospital for further evaluation.  He was noted to have leg weakness and imaging studies revealed evidence of spinal metastatic disease.  A CT scan of his chest showed metastatic nodules throughout his lung with one particularly large nodule near a bronchus in the left upper lobe.  He has never smoked cigarettes.  He denies shortness of breath, recent cough or hemoptysis.  Pulmonary and critical care medicine was consulted for further evaluation.  Since admission to the hospital he has started external beam radiation therapy.  Past Medical History   Past Medical History:  Diagnosis Date  . Agent orange exposure   . Arthritis   . Coronary artery disease    BMS to mid LAD 2001, DES to proximal LAD 2017  . Enlarged prostate    XRT 2016  . Headache    Sinus headaches   . History of depression   . Hyperlipidemia   . Hypothyroidism   . Prostate cancer ([place]) 07/2014    hormonal therapy, external beam radiation therapy  . PTSD (post-traumatic stress disorder)    Significant Hospital Events    Consults:  Radiation Oncology Neurosurgery  Pulmonary  Procedures:    Significant Diagnostic Tests:  April 30 CT angiogram chest showed no pulmonary embolism, multiple pulmonary nodules with the largest being 2.5 x 1.6 x 1.5 in the left upper lobe.  Left hilar and AP window lymphadenopathy also noted.  Findings are highly concerning for bronchogenic carcinoma.  Micro Data:  None  Antimicrobials:  None  Interim history/subjective:   Has no complaints today or concerns  Objective   Blood pressure 138/71, pulse 66, temperature 97.6 F (36.4 C), temperature source Oral, resp. rate 16, height 5\' 8"  (1.727 m), weight 65.8 kg, SpO2 91 %.        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 from midnight  Continue pain management   .  History of prostate cancer -Post radiation treatment in 2016 -PSA is elevated to 239 -This may be the source of his metastatic deposits  The likelihood of him having 2 primary malignancies is a possibility Metastatic prostate cancer and a lung primary  Hyponatremia -Monitor  Hypothyroidism -On Synthroid  BPH  Coronary  artery disease  Discussed with patient and family members today at bedside Questions about bronchoscopy answered  Labs   CBC: Recent Labs  Lab 06/27/19 1107 06/29/19 0456 06/30/19 0549  WBC 8.4 13.2* 9.9  NEUTROABS 6.6  --   --   HGB 14.6 13.3 13.6  HCT 43.5 39.8 40.5  MCV 94.4 94.3 93.8  PLT 244 202 470    Basic Metabolic Panel: Recent Labs  Lab 06/27/19 1107 06/29/19 0456 06/30/19 0549  NA 135 129* 130*  K 4.2 4.1 3.9  CL 98 96* 97*  CO2 23 25 23   GLUCOSE 137* 121* 153*  BUN 17 20 15   CREATININE 0.61 0.73 0.66  CALCIUM 9.6 8.7* 8.7*   GFR: Estimated Creatinine Clearance: 72 mL/min (by C-G formula based on SCr of 0.66 mg/dL). Recent Labs  Lab 06/27/19 1107 06/29/19 0456 06/30/19 0549  WBC 8.4 13.2* 9.9    Liver Function Tests: Recent Labs  Lab 06/27/19 1107  AST 28  ALT 23  ALKPHOS 306*  BILITOT 0.7  PROT 7.7  ALBUMIN 4.5   No results for input(s): LIPASE, AMYLASE in the last 168 hours. No results for input(s): AMMONIA in the last 168 hours.  ABG No results found for: PHART, PCO2ART, PO2ART, HCO3, TCO2, ACIDBASEDEF, O2SAT   Coagulation Profile: No results for input(s): INR, PROTIME in the last 168 hours.  Cardiac Enzymes: No results for input(s): CKTOTAL, CKMB, CKMBINDEX, TROPONINI in the last 168 hours.  HbA1C: Hgb A1c MFr Bld  Date/Time Value Ref Range Status  05/03/2019 10:59 PM 5.7 (H) 4.8 - 5.6 % Final    Comment:    (NOTE) Pre diabetes:          5.7%-6.4% Diabetes:              >6.4% Glycemic control for   <7.0% adults with diabetes     CBG: No results for input(s): GLUCAP in the last 168 hours.  Review of Systems:   Back pain and discomfort is better Respiratory status is stable  Past Medical History  He,  has a past medical history of Agent orange exposure, Arthritis, Coronary artery disease, Enlarged prostate, Headache, History of depression, Hyperlipidemia, Hypothyroidism, Prostate cancer ([place]) (07/2014), and PTSD  (post-traumatic stress disorder).   Surgical History    Past Surgical History:  Procedure Laterality Date  . CARDIAC CATHETERIZATION N/A 12/12/2015   Procedure: Left Heart Cath and Coronary Angiography;  Surgeon: Peter M Martinique, MD;  Location: [facility];  Service: Cardiovascular;  Laterality: N/A;  . CARDIAC CATHETERIZATION N/A 12/12/2015   Procedure: Coronary Stent Intervention;  Surgeon: Peter M Martinique, MD;  Location: [facility];  Service: Cardiovascular;  Laterality: N/A;  . CORONARY STENT INTERVENTION N/A 09/06/2017   Procedure: CORONARY STENT INTERVENTION;  Surgeon: Burnell Blanks, MD;  Location: [facility];  Service: Cardiovascular;  Laterality: N/A;  . HERNIA REPAIR Right   . LEFT HEART CATH AND CORONARY ANGIOGRAPHY N/A 09/02/2016   Procedure: Left Heart Cath and Coronary Angiography;  Surgeon: Leonie Man, MD;  Location: [facility];  Service: Cardiovascular;  Laterality: N/A;  . LEFT HEART CATH AND CORONARY ANGIOGRAPHY N/A 09/06/2017   Procedure: LEFT HEART CATH AND CORONARY ANGIOGRAPHY;  Surgeon: Burnell Blanks, MD;  Location: [facility];  Service: Cardiovascular;  Laterality: N/A;  . LEFT HEART CATH AND CORONARY ANGIOGRAPHY N/A 05/04/2019   Procedure: LEFT HEART CATH AND CORONARY ANGIOGRAPHY;  Surgeon: Martinique, Peter M, MD;  Location: [facility];  Service: Cardiovascular;  Laterality: N/A;  . PROSTATE BIOPSY N/A 08/28/2014   Procedure: BIOPSY TRANSRECTAL ULTRASONIC PROSTATE (TUBP);  Surgeon: Rana Snare, MD;  Location: [facility];  Service: Urology;  Laterality: N/A;  . TRANSURETHRAL RESECTION OF PROSTATE N/A 08/28/2014   Procedure: TRANSURETHRAL RESECTION OF THE PROSTATE WITH GYRUS INSTRUMENTS;  Surgeon: Rana Snare, MD;  Location: [facility];  Service: Urology;  Laterality: N/A;     Social History   reports that he has never smoked. He has quit using smokeless tobacco.  His smokeless tobacco use included chew. He reports  that he does not drink alcohol or use drugs.   Family History   His family history includes Arthritis in his mother; Heart attack in his father.   Allergies Allergies  Allergen Reactions  . Lipitor [Atorvastatin]     Muscles aches    Sherrilyn Rist, MD [facility] Pager: [phone]

## 2019-07-02 ENCOUNTER — Inpatient Hospital Stay (HOSPITAL_COMMUNITY): Payer: Medicare Other | Admitting: Anesthesiology

## 2019-07-02 ENCOUNTER — Other Ambulatory Visit: Payer: Self-pay

## 2019-07-02 ENCOUNTER — Encounter (HOSPITAL_COMMUNITY)
Admission: EM | Disposition: A | Discharge status: Home Health | Payer: Self-pay | Source: Home / Self Care | Attending: Internal Medicine

## 2019-07-02 ENCOUNTER — Ambulatory Visit
Admission: RE | Admit: 2019-07-02 | Discharge: 2019-07-02 | Disposition: A | Discharge status: Home / Self Care | Payer: Medicare Other | Source: Ambulatory Visit | Attending: Radiation Oncology | Admitting: Radiation Oncology

## 2019-07-02 DIAGNOSIS — C801 Malignant (primary) neoplasm, unspecified: Secondary | ICD-10-CM | POA: Insufficient documentation

## 2019-07-02 DIAGNOSIS — C7951 Secondary malignant neoplasm of bone: Secondary | ICD-10-CM | POA: Insufficient documentation

## 2019-07-02 DIAGNOSIS — R918 Other nonspecific abnormal finding of lung field: Secondary | ICD-10-CM | POA: Diagnosis not present

## 2019-07-02 DIAGNOSIS — C61 Malignant neoplasm of prostate: Secondary | ICD-10-CM | POA: Insufficient documentation

## 2019-07-02 DIAGNOSIS — Z510 Encounter for antineoplastic radiation therapy: Secondary | ICD-10-CM | POA: Insufficient documentation

## 2019-07-02 HISTORY — PX: BRONCHIAL WASHINGS: SHX5105

## 2019-07-02 HISTORY — PX: BRONCHIAL NEEDLE ASPIRATION BIOPSY: SHX5106

## 2019-07-02 HISTORY — PX: ENDOBRONCHIAL ULTRASOUND: SHX5096

## 2019-07-02 HISTORY — PX: FLEXIBLE BRONCHOSCOPY: SHX5094

## 2019-07-02 HISTORY — PX: BRONCHIAL BRUSHINGS: SHX5108

## 2019-07-02 SURGERY — ENDOBRONCHIAL ULTRASOUND (EBUS)
Anesthesia: General

## 2019-07-02 MED ORDER — FENTANYL CITRATE (PF) 100 MCG/2ML IJ SOLN
INTRAMUSCULAR | Status: AC
  Filled 2019-07-02: qty 2

## 2019-07-02 MED ORDER — EPHEDRINE SULFATE-NACL 50-0.9 MG/10ML-% IV SOSY
PREFILLED_SYRINGE | INTRAVENOUS | Status: DC | PRN
  Administered 2019-07-02: 10 mg via INTRAVENOUS

## 2019-07-02 MED ORDER — FENTANYL CITRATE (PF) 100 MCG/2ML IJ SOLN
INTRAMUSCULAR | Status: DC | PRN
  Administered 2019-07-02: 100 ug via INTRAVENOUS
  Administered 2019-07-02 (×2): 50 ug via INTRAVENOUS

## 2019-07-02 MED ORDER — PROPOFOL 10 MG/ML IV BOLUS
INTRAVENOUS | Status: DC | PRN
  Administered 2019-07-02: 120 mg via INTRAVENOUS

## 2019-07-02 MED ORDER — DEXAMETHASONE SODIUM PHOSPHATE 10 MG/ML IJ SOLN
INTRAMUSCULAR | Status: DC | PRN
  Administered 2019-07-02: 10 mg via INTRAVENOUS

## 2019-07-02 MED ORDER — SUGAMMADEX SODIUM 200 MG/2ML IV SOLN
INTRAVENOUS | Status: DC | PRN
  Administered 2019-07-02: 200 mg via INTRAVENOUS

## 2019-07-02 MED ORDER — LIDOCAINE 2% (20 MG/ML) 5 ML SYRINGE
INTRAMUSCULAR | Status: DC | PRN
  Administered 2019-07-02: 100 mg via INTRAVENOUS

## 2019-07-02 MED ORDER — LACTATED RINGERS IV SOLN: INTRAVENOUS | Status: DC

## 2019-07-02 MED ORDER — ROCURONIUM BROMIDE 10 MG/ML (PF) SYRINGE
PREFILLED_SYRINGE | INTRAVENOUS | Status: DC | PRN
  Administered 2019-07-02: 50 mg via INTRAVENOUS
  Administered 2019-07-02: 20 mg via INTRAVENOUS

## 2019-07-02 MED ORDER — ONDANSETRON HCL 4 MG/2ML IJ SOLN
INTRAMUSCULAR | Status: DC | PRN
  Administered 2019-07-02: 4 mg via INTRAVENOUS

## 2019-07-02 NOTE — Anesthesia Postprocedure Evaluation (Signed)
Anesthesia Post Note  Patient: [name].  Procedure(s) Performed: ENDOBRONCHIAL ULTRASOUND (N/A ) BRONCHIAL NEEDLE ASPIRATION BIOPSIES BRONCHIAL WASHINGS BRONCHIAL BRUSHINGS     Patient location during evaluation: Endoscopy Anesthesia Type: General Level of consciousness: awake and alert Pain management: pain level controlled Vital Signs Assessment: post-procedure vital signs reviewed and stable Respiratory status: spontaneous breathing, nonlabored ventilation and respiratory function stable Cardiovascular status: blood pressure returned to baseline and stable Postop Assessment: no apparent nausea or vomiting Anesthetic complications: no    Last Vitals:  Vitals:   07/02/19 1520 07/02/19 1525  BP: (!) 144/60   Pulse: 69 71  Resp: 11 16  Temp:    SpO2: 93% 93%    Last Pain:  Vitals:   07/02/19 1525  TempSrc:   PainSc: 7                  Kyley Laurel E Zohar Laing

## 2019-07-02 NOTE — Plan of Care (Signed)
Discussed with patient plan of care for the evening, pain management and bedtime routine with some teach back displayed

## 2019-07-02 NOTE — Op Note (Signed)
  Name:  Keith Olson. MRN:  [id number] DOB:  Apr 07, 1940  PROCEDURE NOTE  Procedure(s): Flexible bronchoscopy (07867) Brushing (54492) of the LUL Bronchial alveolar lavage (01007) of the LUL Endobronchial ultrasound (12197) Transbronchial needle aspiration (58832) of the 10L station   Indications:  Hilar & AP window lymphadenopathy with LUL lung mass , mets to spine, h/o prostate CA  Consent:  Written informed consent was obtained prior to the procedure. The risks of the procedure including coughing, bleeding and the small chance of lung puncture requiring chest tube were discussed in great detail. The benefits & alternatives including serial follow up were also discussed.  Anesthesia:  General endotracheal.  Procedure summary:  Appropriate equipment was assembled.  The patient was  identified as Teacher, music.. Interim history obtained and brought to the operating room. Safety timeout was performed. The patient was placed supine on the operating table, airway established and general anesthesia administered by Anesthesia team.   After the appropriate level of anesthesia was assured, flexible video bronchoscope was lubricated and inserted through the endotracheal tube.    Airway examination was performed bilaterally to subsegmental level.  Minimal clear secretions were noted, mucosa appeared normal and no endobronchial lesions were identified.  Endobronchial ultrasound video bronchoscope was then lubricated and inserted through the endotracheal tube. Surveillance of the mediastinal and and bilateral hilar lymph node stations was performed.  Pathologically enlarged lymph nodes were noted at 10L station  Endobronchial ultrasound guided transbronchial needle aspiration of 10L  (passes 5) was performed, after which EBUS bronchoscope was withdrawn.  Flexible video bronchoscope was used again to perform brushings & BAL from LUL.  After ensuring hemostasis , the bronchoscope was  withdrawn.  The patient was extubated in endoscopy and transferred to recovery.   Specimens sent: Bronchial alveolar lavage specimen of the LUL for   Cytology. Brushings LUL  TBNA frmo 10 L  Complications:  No immediate complications were noted.  Hemodynamic parameters and oxygenation remained stable throughout the procedure.  Estimated blood loss:  Less then 5 mL.   Kara Mead MD. [place] Flood. [facility] Pager [phone] If no response call [phone]   07/02/2019 2:43 PM

## 2019-07-02 NOTE — Progress Notes (Signed)
   NAME:  Bennet Kainen Struckman., MRN:  [id number], DOB:  12-30-1940, LOS: 5 ADMISSION DATE:  06/27/2019, CONSULTATION DATE: April 30 REFERRING MD: Dr. Lonny Prude, CHIEF COMPLAINT: Leg weakness  Brief History   79 year old male with a past medical history significant for prostate cancer presented with cauda equina syndrome, also found to have an abnormal nodule in his left upper lobe worrisome for bronchogenic carcinoma.   He has never smoked cigarettes.  Pulmonary and critical care medicine was consulted for further evaluation.  Since admission to the hospital he has started external beam radiation therapy.  Past Medical History   Past Medical History:  Diagnosis Date  . Agent orange exposure   . Arthritis   . Coronary artery disease    BMS to mid LAD 2001, DES to proximal LAD 2017  . Enlarged prostate    XRT 2016  . Headache    Sinus headaches   . History of depression   . Hyperlipidemia   . Hypothyroidism   . Prostate cancer ([place]) 07/2014   hormonal therapy, external beam radiation therapy  . PTSD (post-traumatic stress disorder)    Significant Hospital Events    Consults:  Radiation Oncology Neurosurgery  Pulmonary  Procedures:    Significant Diagnostic Tests:  April 30 CT angiogram chest showed no pulmonary embolism, multiple pulmonary nodules with the largest being 2.5 x 1.6 x 1.5 in the left upper lobe.  Left hilar and AP window lymphadenopathy also noted.  Findings are highly concerning for bronchogenic carcinoma.  Micro Data:  None  Antimicrobials:  None  Interim history/subjective:  Denies chest pain or dyspnea No back pain Complains of loss of sensation over right hip going down to  ankle  Objective   Blood pressure (!) 169/78, pulse 64, temperature 98.3 F (36.8 C), temperature source Oral, resp. rate 11, height 5\' 8"  (1.727 m), weight 65.8 kg, SpO2 97 %.        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  This likely indicates lung primary,   Plan is for bronchoscopy with endobronchial ultrasound- -risks and benefits of procedure were discussed in detail -He has started on external beam radiation Will eventually need PET scan as outpatient Oncology, neurosurgery consult reviewed   .  History of prostate cancer -Post radiation treatment in 2016 -PSA  elevated to [place] MD. FCCP. [facility]  If no response to pager , please call [phone]   07/02/2019

## 2019-07-02 NOTE — Anesthesia Preprocedure Evaluation (Signed)
Anesthesia Evaluation  Patient identified by MRN, date of birth, ID band Patient awake    Reviewed: Allergy & Precautions, NPO status , Patient's Chart, lab work & pertinent test results  History of Anesthesia Complications Negative for: history of anesthetic complications  Airway Mallampati: II  TM Distance: >3 FB Neck ROM: Full    Dental   Pulmonary neg pulmonary ROS,    Pulmonary exam normal        Cardiovascular + CAD and + Cardiac Stents  Normal cardiovascular exam     Neuro/Psych  Headaches, PSYCHIATRIC DISORDERS Anxiety    GI/Hepatic negative GI ROS, Neg liver ROS,   Endo/Other  Hypothyroidism   Renal/GU negative Renal ROS   Prostate cancer w/ mets to spine, cauda equina syndrome, receiving XRT    Musculoskeletal negative musculoskeletal ROS (+)   Abdominal   Peds  Hematology negative hematology ROS (+)   Anesthesia Other Findings  Cath March 2021: nonobstructive CAD, stents patent, normal EF  Reproductive/Obstetrics                             Anesthesia Physical Anesthesia Plan  ASA: III  Anesthesia Plan: General   Post-op Pain Management:    Induction: Intravenous  PONV Risk Score and Plan: 2 and Ondansetron, Dexamethasone, Treatment may vary due to age or medical condition and Midazolam  Airway Management Planned: Oral ETT  Additional Equipment: None  Intra-op Plan:   Post-operative Plan: Extubation in OR  Informed Consent: I have reviewed the patients History and Physical, chart, labs and discussed the procedure including the risks, benefits and alternatives for the proposed anesthesia with the patient or authorized representative who has indicated his/her understanding and acceptance.     Dental advisory given  Plan Discussed with:   Anesthesia Plan Comments:         Anesthesia Quick Evaluation

## 2019-07-02 NOTE — Interval H&P Note (Signed)
History and Physical Interval Note:  07/02/2019 12:41 PM  Cerrone Toniann Fail.  has presented today for surgery, with the diagnosis of lung nodules.  The various methods of treatment have been discussed with the patient and family. After consideration of risks, benefits and other options for treatment, the patient has consented to  Procedure(s): ENDOBRONCHIAL ULTRASOUND (N/A) as a surgical intervention.  The patient's history has been reviewed, patient examined, no change in status, stable for surgery.  I have reviewed the patient's chart and labs.  Questions were answered to the patient's satisfaction.     Leanna Sato Elsworth Soho

## 2019-07-02 NOTE — Transfer of Care (Signed)
Immediate Anesthesia Transfer of Care Note  Patient: Keith Olson.  Procedure(s) Performed: ENDOBRONCHIAL ULTRASOUND (N/A )  Patient Location: Endoscopy Unit  Anesthesia Type:General  Level of Consciousness: awake and oriented  Airway & Oxygen Therapy: Patient Spontanous Breathing and Patient connected to face mask oxygen  Post-op Assessment: Report given to RN and Post -op Vital signs reviewed and stable  Post vital signs: Reviewed and stable  Last Vitals:  Vitals Value Taken Time  BP    Temp    Pulse 75 07/02/19 1456  Resp 11 07/02/19 1456  SpO2 100 % 07/02/19 1456  Vitals shown include unvalidated device data.  Last Pain:  Vitals:   07/02/19 1242  TempSrc: Oral  PainSc: 8       Patients Stated Pain Goal: 2 (38/88/75 7972)  Complications: No apparent anesthesia complications

## 2019-07-02 NOTE — Progress Notes (Signed)
PROGRESS NOTE    Thaer Toniann Fail.  OIZ:[id number] DOB: 03-Apr-1940 DOA: 06/27/2019 PCP: Dettinger, Fransisca Kaufmann, MD    Brief Narrative:  79 year old retired [place] [employer] who served in [employer] with a history of ischemic cardiomyopathy,prostate cancer status post radiation therapy (2016) who presented on 06/27/2019 with ongoing lower back and pelvic area pain for 6 months that acutely worsened over the past 2 weeks.  He was was previously treated by his PCP with Vicodin with no improvement in pain and recommended to go to ED.  Patient presented with worsening back pain, right lower extremity numbness and bilateral lower extremity weakness.  Work-up included CT of chest abdomen pelvis was negative for PE new mass concerning for bronchogenic carcinoma with lymph node involvement.  MRI cervical/thoracic/lumbar spine showed extensive metastatic osseous lesions with T3 fracture but no cord compression, as well as L4 fracture with severe spinal stenosis and crowding of cauda equina.  PSA 239. [facility] where he was evaluated by neurosurgery who recommends nonoperative treatment by radiation and oncology.  Question if metastasis from recurrent prostate versus new primary lung CA.  He was started on Decadron 4 mg twice daily along with bicalutamide 50 mg daily x 28 days with lupron shots to start at approximately 14 days into treatment (likely to be administered in outpatient setting). Radiation to L4 vertebrae started with plan for total 30 Gy in 10 fractions with first session 06/28/2019.    He underwent bronchoscopy with transbronchial biopsy 05/01/8248 to ascertain if prostate versus lung is the primary.   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   -recurrence of prostate cancer (PSA quite elevated )with metastasis VS new bronchogenic carcinoma (given large lung nodule on CT chest imaging ).    - Not a candidate for back surgery per neurosurgery evaluation given all levels of the lumbar spine infiltrated with tumor and no urgent need for decompression with no red flag symptoms currently ( no weakness, bowel/urine incontinence).  Started emergent radiation on 4/29  -Currently undergoing palliative r radiotherapy by radiation oncology.  Plan for total 30 Gy in 10 fractions. -IV Decadron 4 mg twice daily -Plan for bicalutamide 50 mg daily x 28 days with lupron shots to start at approximately 14 days into treatment (likely to be administered in outpatient setting).   -Endoscopic transbronchial biopsy performed today to ascertain if lung lesions are secondary from prostate CA versus new primary.  #Acute back pain, severe.   -Dilaudid 2 mg every 2 hours for severe pain -Schedule IV Robaxin 1 mg every 8 hours for 3 days -Continue Norco 1-2 tabs 10/325 mg every 6 hours as needed moderate pain -Gabapentin.  #Multiple pulmonary nodules.  Largest in left upper lobe measuring 2 x 5 by 1 x 6 x 1.5 cm, macrolobulated and associated with left hilar lymphadenopathy.  Findings highly concerning for primary bronchogenic carcinoma with metastatic disease to the lymph nodes and lungs in the thorax -Normal respiratory effort on room air -Oncology  consulted -Bronch with EUS arranged for 07/02/19.NPO tonight.    #Hypothyroidism, stable -continue Synthroid 75 mcg daily  #GERD, stable -continue Protonix 20 mg daily  #Hyperlipidemia, stable -Continue simvastatin  #BPH,  stable -Continue Flomax  #CAD, stable.  Asymptomatic.  Previously treated stents -Continue aspirin  #PT evaluation today.   DVT prophylaxis: Lovenox   code Status: Full code Family Communication: None   status is: Inpatient  Remains inpatient appropriate because:Inpatient level of care appropriate due to severity of illness   Dispo: The patient is from: Home              Anticipated d/c is to: SNF              Anticipated d/c date is: > 3 days              Patient currently is not medically stable to d/c.          Consultants:  Radiation oncology, critical care/pulmonary, oncology.  Procedures: Radiation to spine, bronchoscopy with endoscopic ultrasound and biopsy 07/02/2019  Antimicrobials: None  Subjective: Patient has no new complaints.  Reports pain is reasonably controlled.  Objective: Vitals:   07/02/19 1510 07/02/19 1520 07/02/19 1525 07/02/19 1755  BP: (!) 145/59 (!) 144/60    Pulse: 77 69 71 78  Resp: 17 11 16    Temp:      TempSrc:      SpO2: 95% 93% 93% 93%  Weight:      Height:        Intake/Output Summary (Last 24 hours) at 07/02/2019 1805 Last data filed at 07/02/2019 1456 Gross per 24 hour  Intake 1140 ml  Output 1350 ml  Net -210 ml   Filed Weights   06/27/19 1028 07/02/19 1242  Weight: 65.8 kg 65.8 kg    Examination:  General exam: Appears calm and comfortable no apparent distress.  Looks younger for age. Respiratory system: Clear to auscultation. Respiratory effort normal. Cardiovascular system: S1 & S2 heard, RRR. No JVD, murmurs, rubs, gallops or clicks. No pedal edema. Gastrointestinal system: Abdomen is nondistended, soft and nontender. No organomegaly or masses felt. Normal bowel sounds heard. Central nervous system: Alert and oriented. No focal neurological deficits. Extremities: Symmetric 5 x 5 power. Skin: No rashes, lesions or ulcers Psychiatry: Judgement and insight appear normal. Mood & affect appropriate.     Data  Reviewed: I have personally reviewed following labs and imaging studies  CBC: Recent Labs  Lab 06/27/19 1107 06/29/19 0456 06/30/19 0549  WBC 8.4 13.2* 9.9  NEUTROABS 6.6  --   --   HGB 14.6 13.3 13.6  HCT 43.5 39.8 40.5  MCV 94.4 94.3 93.8  PLT 244 202 580   Basic Metabolic Panel: Recent Labs  Lab 06/27/19 1107 06/29/19 0456 06/30/19 0549  NA 135 129* 130*  K 4.2 4.1 3.9  CL 98 96* 97*  CO2 23 25 23   GLUCOSE 137* 121* 153*  BUN 17 20 15   CREATININE 0.61 0.73 0.66  CALCIUM 9.6 8.7* 8.7*   GFR: Estimated Creatinine Clearance: 72 mL/min (by C-G formula based on SCr of 0.66 mg/dL). Liver Function Tests: Recent Labs  Lab 06/27/19 1107  AST 28  ALT 23  ALKPHOS 306*  BILITOT 0.7  PROT 7.7  ALBUMIN 4.5   No results for input(s): LIPASE, AMYLASE in the last 168 hours. No results for input(s): AMMONIA in the last 168 hours. Coagulation Profile: No results for input(s): INR, PROTIME in the last 168 hours. Cardiac Enzymes:  No results for input(s): CKTOTAL, CKMB, CKMBINDEX, TROPONINI in the last 168 hours. BNP (last 3 results) No results for input(s): PROBNP in the last 8760 hours. HbA1C: No results for input(s): HGBA1C in the last 72 hours. CBG: No results for input(s): GLUCAP in the last 168 hours. Lipid Profile: No results for input(s): CHOL, HDL, LDLCALC, TRIG, CHOLHDL, LDLDIRECT in the last 72 hours. Thyroid Function Tests: No results for input(s): TSH, T4TOTAL, FREET4, T3FREE, THYROIDAB in the last 72 hours. Anemia Panel: No results for input(s): VITAMINB12, FOLATE, FERRITIN, TIBC, IRON, RETICCTPCT in the last 72 hours. Sepsis Labs: No results for input(s): PROCALCITON, LATICACIDVEN in the last 168 hours.  Recent Results (from the past 240 hour(s))  Respiratory Panel by RT PCR (Flu A&B, Covid) - Nasopharyngeal Swab     Status: None   Collection Time: 06/27/19  8:23 PM   Specimen: Nasopharyngeal Swab  Result Value Ref Range Status   SARS Coronavirus 2 by  RT PCR NEGATIVE NEGATIVE Final    Comment: (NOTE) SARS-CoV-2 target nucleic acids are NOT DETECTED. The SARS-CoV-2 RNA is generally detectable in upper respiratoy specimens during the acute phase of infection. The lowest concentration of SARS-CoV-2 viral copies this assay can detect is 131 copies/mL. A negative result does not preclude SARS-Cov-2 infection and should not be used as the sole basis for treatment or other patient management decisions. A negative result may occur with  improper specimen collection/handling, submission of specimen other than nasopharyngeal swab, presence of viral mutation(s) within the areas targeted by this assay, and inadequate number of viral copies (<131 copies/mL). A negative result must be combined with clinical observations, patient history, and epidemiological information. The expected result is Negative. Fact Sheet for Patients:  PinkCheek.be Fact Sheet for Healthcare Providers:  GravelBags.it This test is not yet ap proved or cleared by the [place] FDA and  has been authorized for detection and/or diagnosis of SARS-CoV-2 by FDA under an Emergency Use Authorization (EUA). This EUA will remain  in effect (meaning this test can be used) for the duration of the COVID-19 declaration under Section 564(b)(1) of the Act, 21 U.S.C. section 360bbb-3(b)(1), unless the authorization is terminated or revoked sooner.    Influenza A by PCR NEGATIVE NEGATIVE Final   Influenza B by PCR NEGATIVE NEGATIVE Final    Comment: (NOTE) The Xpert Xpress SARS-CoV-2/FLU/RSV assay is intended as an aid in  the diagnosis of influenza from Nasopharyngeal swab specimens and  should not be used as a sole basis for treatment. Nasal washings and  aspirates are unacceptable for Xpert Xpress SARS-CoV-2/FLU/RSV  testing. Fact Sheet for Patients: PinkCheek.be Fact Sheet for Healthcare  Providers: GravelBags.it This test is not yet approved or cleared by the [place] FDA and  has been authorized for detection and/or diagnosis of SARS-CoV-2 by  FDA under an Emergency Use Authorization (EUA). This EUA will remain  in effect (meaning this test can be used) for the duration of the  Covid-19 declaration under Section 564(b)(1) of the Act, 21  U.S.C. section 360bbb-3(b)(1), unless the authorization is  terminated or revoked. Performed at [facility], [address], [place], [place] [address]          Radiology Studies: No results found.      Scheduled Meds: . aspirin EC  81 mg Oral Daily  . bicalutamide  50 mg Oral Daily  . Chlorhexidine Gluconate Cloth  6 each Topical Daily  . dexamethasone (DECADRON) injection  4 mg Intravenous Q12H  . gabapentin  300 mg Oral TID  . heparin  5,000 Units Subcutaneous Q8H  . levothyroxine  75 mcg Oral QAC breakfast  . pantoprazole  40 mg Oral Daily  . rosuvastatin  40 mg Oral Daily  . tamsulosin  0.4 mg Oral QHS   Continuous Infusions: . lactated ringers 10 mL/hr at 07/02/19 1259     LOS: 5 days    Time spent: Greater than 35 minutes    Kymiah Araiza, MD [facility]   To contact the attending provider between 7A-7P or the covering provider during after hours 7P-7A, please log into the web site [email or web address] and access using universal [place] password for that web site. If you do not have the password, please call the hospital operator.  07/02/2019, 6:05 PM

## 2019-07-02 NOTE — Progress Notes (Signed)
EBUS station 10 L performed Preliminary report -good sample, atypical cells, will need further staining and evaluation by pathology  Daeshawn Redmann V. Elsworth Soho MD

## 2019-07-02 NOTE — Anesthesia Procedure Notes (Signed)
Procedure Name: Intubation Date/Time: 07/02/2019 1:23 PM Performed by: Sharlette Dense, CRNA Patient Re-evaluated:Patient Re-evaluated prior to induction Oxygen Delivery Method: Circle system utilized Preoxygenation: Pre-oxygenation with 100% oxygen Induction Type: IV induction Ventilation: Mask ventilation without difficulty Laryngoscope Size: Miller and 3 Grade View: Grade I Tube size (mm): 9.5. Number of attempts: 1 Airway Equipment and Method: Stylet Placement Confirmation: ETT inserted through vocal cords under direct vision,  positive ETCO2 and breath sounds checked- equal and bilateral Tube secured with: Tape Dental Injury: Teeth and Oropharynx as per pre-operative assessment

## 2019-07-03 ENCOUNTER — Encounter: Payer: Self-pay | Admitting: *Deleted

## 2019-07-03 ENCOUNTER — Ambulatory Visit
Admit: 2019-07-03 | Discharge: 2019-07-03 | Disposition: A | Discharge status: Home / Self Care | Payer: Medicare Other | Attending: Radiation Oncology | Admitting: Radiation Oncology

## 2019-07-03 MED ORDER — DOCUSATE SODIUM 100 MG PO CAPS: 100.0000 mg | ORAL_CAPSULE | Freq: Two times a day (BID) | ORAL | Status: DC | PRN

## 2019-07-03 MED ORDER — FENTANYL 25 MCG/HR TD PT72
1.0000 | MEDICATED_PATCH | TRANSDERMAL | Status: DC
  Administered 2019-07-03 – 2019-07-06 (×2): 1 via TRANSDERMAL
  Filled 2019-07-03 (×2): qty 1

## 2019-07-03 MED ORDER — HYDROMORPHONE HCL 1 MG/ML IJ SOLN
1.0000 mg | INTRAMUSCULAR | Status: DC | PRN
  Administered 2019-07-03 – 2019-07-04 (×4): 1 mg via INTRAVENOUS
  Filled 2019-07-03 (×4): qty 1

## 2019-07-03 MED ORDER — OXYCODONE HCL 5 MG PO TABS
10.0000 mg | ORAL_TABLET | Freq: Four times a day (QID) | ORAL | Status: DC | PRN
  Administered 2019-07-04: 10 mg via ORAL
  Filled 2019-07-03: qty 2

## 2019-07-03 MED ORDER — LIDOCAINE 5 % EX PTCH
1.0000 | MEDICATED_PATCH | CUTANEOUS | Status: DC
  Administered 2019-07-03 – 2019-07-05 (×3): 1 via TRANSDERMAL
  Filled 2019-07-03 (×4): qty 1

## 2019-07-03 MED ORDER — POLYETHYLENE GLYCOL 3350 17 G PO PACK
17.0000 g | PACK | Freq: Every day | ORAL | Status: DC | PRN
  Administered 2019-07-03: 17 g via ORAL
  Filled 2019-07-03: qty 1

## 2019-07-03 MED ORDER — ACETAMINOPHEN 325 MG PO TABS
650.0000 mg | ORAL_TABLET | Freq: Four times a day (QID) | ORAL | Status: DC
  Administered 2019-07-03 – 2019-07-06 (×12): 650 mg via ORAL
  Filled 2019-07-03 (×12): qty 2

## 2019-07-03 MED ORDER — GABAPENTIN 400 MG PO CAPS
400.0000 mg | ORAL_CAPSULE | Freq: Three times a day (TID) | ORAL | Status: DC
  Administered 2019-07-03 – 2019-07-06 (×8): 400 mg via ORAL
  Filled 2019-07-03 (×8): qty 1

## 2019-07-03 NOTE — Care Management Important Message (Signed)
Important Message  Patient Details IM Letter given to Marney Doctor RN Case Manager to present to the Patient Name: Keith Olson. MRN: [id number] Date of Birth: [birth year]   Medicare Important Message Given:  Yes     Kerin Salen 07/03/2019, 11:20 AM

## 2019-07-03 NOTE — Progress Notes (Addendum)
PROGRESS NOTE    Keith Olson.  NWG:[id number] DOB: Jul 23, 1940 DOA: 06/27/2019 PCP: Dettinger, Fransisca Kaufmann, MD    Brief Narrative:  79 year old retired [place] [employer] who served in [employer] with a history of ischemic cardiomyopathy,prostate cancer status post radiation therapy (2016) who presented on 06/27/2019 with ongoing lower back and pelvic area pain for 6 months that acutely worsened over the past 2 weeks.  He was was previously treated by his PCP with Vicodin with no improvement in pain and recommended to go to ED.  Patient presented with worsening back pain, right lower extremity numbness and bilateral lower extremity weakness.  Work-up included CT of chest abdomen pelvis was negative for PE new mass concerning for bronchogenic carcinoma with lymph node involvement.  MRI cervical/thoracic/lumbar spine showed extensive metastatic osseous lesions with T3 fracture but no cord compression, as well as L4 fracture with severe spinal stenosis and crowding of cauda equina.  PSA 239. Patient was transferred from [facility] to [facility] on 4/28 where he was evaluated by neurosurgery who recommends nonoperative treatment by radiation and oncology.  Question if metastasis from recurrent prostate versus new primary lung CA.  He was started on Decadron 4 mg twice daily along with bicalutamide 50 mg daily x 28 days with lupron shots to start at approximately 14 days into treatment (likely to be administered in outpatient setting). Radiation to L4 vertebrae started with plan for total 30 Gy in 10 fractions with first session 06/28/2019.    He underwent bronchoscopy with transbronchial biopsy 07/06/8467 to ascertain if prostate versus lung is the primary.   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   -recurrence of prostate cancer (PSA 239 )with metastasis VS new bronchogenic carcinoma (given large lung nodule on CT chest imaging ).    - Not a candidate for back surgery per neurosurgery evaluation given all levels of the lumbar spine infiltrated with tumor and no urgent need for decompression with no red flag symptoms currently ( no weakness, bowel/urine incontinence).  Started emergent radiation on 4/29  -Currently undergoing palliative r radiotherapy by radiation oncology.  Plan for total 30 Gy in 10 fractions. -IV Decadron 4 mg twice daily -Plan for bicalutamide 50 mg daily x 28 days with lupron shots to start at approximately 14 days into treatment (likely to be administered in outpatient setting).   -Endoscopic transbronchial biopsy performed 07/31/93 to ascertain if lung lesions are secondary from prostate CA versus new primary.  #Acute back pain, severe.   -Dilaudid 2 mg every 2 hours for severe pain -Schedule IV Robaxin 1 mg every 8 hours for 3 days -Continue Norco 1-2 tabs 10/325 mg every 6 hours as needed moderate pain -Gabapentin.   #Hypothyroidism, stable -continue Synthroid 75 mcg daily  #GERD, stable -continue Protonix 20 mg daily  #Hyperlipidemia, stable -Continue simvastatin  #BPH, stable -Continue Flomax  #CAD, stable.  Asymptomatic.  Previously treated stents -Continue aspirin  #PT recommends home with physical therapy.  However he is requiring Dilaudid 1 mg every 2 hours.  Doubt he can go home with such Dilaudid requirement.  We will try to uptitrate the dose of gabapentin as well as add fentanyl to see if he can be weaned off Dilaudid.  DVT prophylaxis: Lovenox   code Status: Full  code Family Communication: None   status is: Inpatient  Remains inpatient appropriate because:Inpatient level of care appropriate due to severity of illness   Dispo: The patient is from: Home              Anticipated d/c is to: SNF              Anticipated d/c date is: > 3 days              Patient currently is not medically stable to d/c.          Consultants:  Radiation oncology, critical care/pulmonary, oncology.  Procedures: Radiation to spine, bronchoscopy with endoscopic ultrasound and biopsy 07/02/2019  Antimicrobials: None  Subjective: Patient has no new complaints.  Reports pain is reasonably controlled.  Objective: Vitals:   07/02/19 1755 07/02/19 2006 07/03/19 0334 07/03/19 1458  BP:  (!) 148/73 (!) 142/73 123/69  Pulse: 78 74 66 61  Resp:  18 16 16   Temp:  97.8 F (36.6 C) 98 F (36.7 C) 98.1 F (36.7 C)  TempSrc:  Oral Oral Oral  SpO2: 93% 92% 94% 93%  Weight:      Height:        Intake/Output Summary (Last 24 hours) at 07/03/2019 1735 Last data filed at 07/03/2019 0945 Gross per 24 hour  Intake 750.67 ml  Output 2100 ml  Net -1349.33 ml   Filed Weights   06/27/19 1028 07/02/19 1242  Weight: 65.8 kg 65.8 kg    Examination:  General exam: Appears calm and comfortable no apparent distress.  Looks younger for age. Respiratory system: Clear to auscultation. Respiratory effort normal. Cardiovascular system: S1 & S2 heard, RRR. No JVD, murmurs, rubs, gallops or clicks. No pedal edema. Gastrointestinal system: Abdomen is nondistended, soft and nontender. No organomegaly or masses felt. Normal bowel sounds heard. Central nervous system: Alert and oriented. No focal neurological deficits. Extremities: Motor 5/5 in left lower extremity however 3/5 in right lower extremity.  Also decreased sensation in right lower extremity.  Skin: No rashes, lesions or ulcers Psychiatry: Judgement and insight appear normal. Mood & affect appropriate.     Data  Reviewed: I have personally reviewed following labs and imaging studies  CBC: Recent Labs  Lab 06/27/19 1107 06/29/19 0456 06/30/19 0549  WBC 8.4 13.2* 9.9  NEUTROABS 6.6  --   --   HGB 14.6 13.3 13.6  HCT 43.5 39.8 40.5  MCV 94.4 94.3 93.8  PLT 244 202 347   Basic Metabolic Panel: Recent Labs  Lab 06/27/19 1107 06/29/19 0456 06/30/19 0549  NA 135 129* 130*  K 4.2 4.1 3.9  CL 98 96* 97*  CO2 23 25 23   GLUCOSE 137* 121* 153*  BUN 17 20 15   CREATININE 0.61 0.73 0.66  CALCIUM 9.6 8.7* 8.7*   GFR: Estimated Creatinine Clearance: 72 mL/min (by C-G formula based on SCr of 0.66 mg/dL). Liver Function Tests: Recent Labs  Lab 06/27/19 1107  AST 28  ALT 23  ALKPHOS 306*  BILITOT 0.7  PROT 7.7  ALBUMIN 4.5   No results for input(s): LIPASE, AMYLASE in the last 168 hours. No results for input(s): AMMONIA in the last 168 hours. Coagulation Profile: No results for input(s): INR, PROTIME in  the last 168 hours. Cardiac Enzymes: No results for input(s): CKTOTAL, CKMB, CKMBINDEX, TROPONINI in the last 168 hours. BNP (last 3 results) No results for input(s): PROBNP in the last 8760 hours. HbA1C: No results for input(s): HGBA1C in the last 72 hours. CBG: No results for input(s): GLUCAP in the last 168 hours. Lipid Profile: No results for input(s): CHOL, HDL, LDLCALC, TRIG, CHOLHDL, LDLDIRECT in the last 72 hours. Thyroid Function Tests: No results for input(s): TSH, T4TOTAL, FREET4, T3FREE, THYROIDAB in the last 72 hours. Anemia Panel: No results for input(s): VITAMINB12, FOLATE, FERRITIN, TIBC, IRON, RETICCTPCT in the last 72 hours. Sepsis Labs: No results for input(s): PROCALCITON, LATICACIDVEN in the last 168 hours.  Recent Results (from the past 240 hour(s))  Respiratory Panel by RT PCR (Flu A&B, Covid) - Nasopharyngeal Swab     Status: None   Collection Time: 06/27/19  8:23 PM   Specimen: Nasopharyngeal Swab  Result Value Ref Range Status   SARS Coronavirus 2 by  RT PCR NEGATIVE NEGATIVE Final    Comment: (NOTE) SARS-CoV-2 target nucleic acids are NOT DETECTED. The SARS-CoV-2 RNA is generally detectable in upper respiratoy specimens during the acute phase of infection. The lowest concentration of SARS-CoV-2 viral copies this assay can detect is 131 copies/mL. A negative result does not preclude SARS-Cov-2 infection and should not be used as the sole basis for treatment or other patient management decisions. A negative result may occur with  improper specimen collection/handling, submission of specimen other than nasopharyngeal swab, presence of viral mutation(s) within the areas targeted by this assay, and inadequate number of viral copies (<131 copies/mL). A negative result must be combined with clinical observations, patient history, and epidemiological information. The expected result is Negative. Fact Sheet for Patients:  PinkCheek.be Fact Sheet for Healthcare Providers:  GravelBags.it This test is not yet ap proved or cleared by the [place] FDA and  has been authorized for detection and/or diagnosis of SARS-CoV-2 by FDA under an Emergency Use Authorization (EUA). This EUA will remain  in effect (meaning this test can be used) for the duration of the COVID-19 declaration under Section 564(b)(1) of the Act, 21 U.S.C. section 360bbb-3(b)(1), unless the authorization is terminated or revoked sooner.    Influenza A by PCR NEGATIVE NEGATIVE Final   Influenza B by PCR NEGATIVE NEGATIVE Final    Comment: (NOTE) The Xpert Xpress SARS-CoV-2/FLU/RSV assay is intended as an aid in  the diagnosis of influenza from Nasopharyngeal swab specimens and  should not be used as a sole basis for treatment. Nasal washings and  aspirates are unacceptable for Xpert Xpress SARS-CoV-2/FLU/RSV  testing. Fact Sheet for Patients: PinkCheek.be Fact Sheet for Healthcare  Providers: GravelBags.it This test is not yet approved or cleared by the [place] FDA and  has been authorized for detection and/or diagnosis of SARS-CoV-2 by  FDA under an Emergency Use Authorization (EUA). This EUA will remain  in effect (meaning this test can be used) for the duration of the  Covid-19 declaration under Section 564(b)(1) of the Act, 21  U.S.C. section 360bbb-3(b)(1), unless the authorization is  terminated or revoked. Performed at [facility], [address], [place], [place] [address]          Radiology Studies: No results found.      Scheduled Meds: . aspirin EC  81 mg Oral Daily  . bicalutamide  50 mg Oral Daily  . Chlorhexidine Gluconate Cloth  6 each Topical Daily  . dexamethasone (DECADRON) injection  4  mg Intravenous Q12H  . gabapentin  300 mg Oral TID  . heparin  5,000 Units Subcutaneous Q8H  . levothyroxine  75 mcg Oral QAC breakfast  . pantoprazole  40 mg Oral Daily  . rosuvastatin  40 mg Oral Daily  . tamsulosin  0.4 mg Oral QHS   Continuous Infusions: . lactated ringers Stopped (07/02/19 1800)     LOS: 6 days    Time spent: Greater than 35 minutes    Kal Chait, MD [facility]   To contact the attending provider between 7A-7P or the covering provider during after hours 7P-7A, please log into the web site [email or web address] and access using universal [place] password for that web site. If you do not have the password, please call the hospital operator.  07/03/2019, 5:35 PM

## 2019-07-03 NOTE — Evaluation (Signed)
Physical Therapy Evaluation Patient Details Name: Keith Olson. MRN: [id number] DOB: [birth year] Today's Date: 07/03/2019   History of Present Illness  79 year old retired [place] [employer] who served in [employer] with a history of ischemic cardiomyopathy,prostate cancer status post radiation therapy (2016) who presented on 06/27/2019 with ongoing lower back and pelvic area pain for 6 months that acutely worsened over the past 2 weeks. CT of chest/ abdomen /pelvis was negative for PE but found new mass concerning for bronchogenic carcinoma with lymph node involvement.  MRI cervical/thoracic/lumbar spine showed extensive metastatic osseous lesions with T3 fracture but no cord compression, as well as L4 fracture with severe spinal stenosis and crowding of cauda equina.  PSA 239. Patient  was evaluated by neurosurgery who recommends nonoperative treatment by radiation and oncology.  Clinical Impression  The patient is most anxious about the impaired sensation of the right leg and pain. Patient was  Premedicated with IV medication. (previous attempt at  PT with patient unable to tolerate with c/o pain running down back of right leg so requested premedication) Patient  tolerated sitting and standing  At RW. Required mod-max assist to stand at [facility] and close guarding right knee to prevent buckling. Right LE noted with decreased control,impaired proprioception. Patient reports inability to feel his right foot on floor.   Patient also expresses concern for care of his wife (for whom he cared for )who he reports has Alzheimer's disease, currently  son and daughter in law are caring for her.   Patient is excellent candidate for CIR. Pt admitted with above diagnosis.  Pt currently with functional limitations due to the deficits listed below (see PT Problem List). Pt will benefit from skilled PT to increase their independence and safety with mobility to allow discharge to the venue listed below.       Follow Up  Recommendations Supervision/Assistance - 24 hour;  CIR    Equipment Recommendations  Rolling walker with 5" wheels;Wheelchair (measurements PT);Wheelchair cushion (measurements PT)    Recommendations for Other Services OT consult;Rehab consult     Precautions / Restrictions Precautions Precautions: Fall Precaution Comments: needs premeds, sensory deficits rt leg, knee may buckle, cannot feel foot to thigh      Mobility  Bed Mobility Overal bed mobility: Needs Assistance Bed Mobility: Supine to Sit;Sit to Supine     Supine to sit: Supervision Sit to supine: Supervision   General bed mobility comments: initially prior to Dilaudid, patient could not attempt mobility. Post medication, patient mobilized self to sitting and beack to supine, used lt leg to pick up right leg.  Transfers Overall transfer level: Needs assistance Equipment used: Rolling walker (2 wheeled) Transfers: Sit to/from Stand Sit to Stand: Max assist         General transfer comment: with bed raised, max assist to stand from bed with multimodal cues to push through UE's to power up, Right knee braced to prevent buckling, patient reports that he cannot feel his rt foot on the floor. patient sat down to rest with decrwased control of descent.rested and assisted to stand up again , mod assist to power up. stood x 1.5 minutes.  Ambulation/Gait                Stairs            Wheelchair Mobility    Modified Rankin (Stroke Patients Only)       Balance Overall balance assessment: Needs assistance Sitting-balance support: No upper extremity supported;Feet supported Sitting balance-Leahy Scale:  Fair     Standing balance support: Bilateral upper extremity supported;During functional activity Standing balance-Leahy Scale: Poor Standing balance comment: reliant on UE's                             Pertinent Vitals/Pain Pain Assessment: 0-10 Pain Score: 6  Pain Location: right  pelvic area down the leg , at rest was less pain. Pain Descriptors / Indicators: Discomfort;Cramping;Numbness;Shooting Pain Intervention(s): Monitored during session;Premedicated before session;Repositioned    Home Living Family/patient expects to be discharged to:: Private residence Living Arrangements: Spouse/significant other(who has severe Alzheimer's per pt.) Available Help at Discharge: Family Type of Home: House Home Access: Stairs to enter   Technical brewer of Steps: 1 Home Layout: Two level Home Equipment: None Additional Comments: wife has alzheiner's and requires total care by patient, now son and daughter in law are taking care of patient's wife.    Prior Function Level of Independence: Independent         Comments: until recently much difficulty ambulating     Hand Dominance   Dominant Hand: Right    Extremity/Trunk Assessment   Upper Extremity Assessment Upper Extremity Assessment: Overall WFL for tasks assessed    Lower Extremity Assessment Lower Extremity Assessment: RLE deficits/detail RLE Deficits / Details: grossly 3+to 4/5 when pain is controlled, decr\. LT from pelvic to knee, absent lower leg/foot.absent sensation from thigh to foot, RLE Sensation: decreased light touch;decreased proprioception RLE Coordination: decreased fine motor    Cervical / Trunk Assessment Cervical / Trunk Assessment: Normal  Communication   Communication: No difficulties  Cognition Arousal/Alertness: Awake/alert Behavior During Therapy: WFL for tasks assessed/performed;Anxious Overall Cognitive Status: Within Functional Limits for tasks assessed                                        General Comments      Exercises     Assessment/Plan    PT Assessment Patient needs continued PT services  PT Problem List Decreased strength;Decreased balance;Decreased knowledge of precautions;Decreased mobility;Decreased knowledge of use of DME;Decreased  safety awareness;Decreased activity tolerance;Decreased coordination;Impaired sensation;Pain       PT Treatment Interventions DME instruction;Functional mobility training;Patient/family education;Balance training;Gait training;Therapeutic activities;Wheelchair mobility training;Therapeutic exercise    PT Goals (Current goals can be found in the Care Plan section)  Acute Rehab PT Goals Patient Stated Goal: to be able to walk and take care of my wife. PT Goal Formulation: With patient Time For Goal Achievement: 07/17/19 Potential to Achieve Goals: Good    Frequency Min 3X/week   Barriers to discharge Decreased caregiver support wife has dementia,    Co-evaluation               AM-PAC PT "6 Clicks" Mobility  Outcome Measure Help needed turning from your back to your side while in a flat bed without using bedrails?: A Little Help needed moving from lying on your back to sitting on the side of a flat bed without using bedrails?: A Little Help needed moving to and from a bed to a chair (including a wheelchair)?: Total Help needed standing up from a chair using your arms (e.g., wheelchair or bedside chair)?: Total Help needed to walk in hospital room?: Total Help needed climbing 3-5 steps with a railing? : Total 6 Click Score: 10    End of Session Equipment Utilized During Treatment:  Gait belt Activity Tolerance: Patient tolerated treatment well(after medication) Patient left: in bed;with call bell/phone within reach;with bed alarm set Nurse Communication: Mobility status PT Visit Diagnosis: Muscle weakness (generalized) (M62.81);Unsteadiness on feet (R26.81);Other symptoms and signs involving the nervous system (R29.898);Pain Pain - Right/Left: Right    Time: 4627-0350 PT Time Calculation (min) (ACUTE ONLY): 24 min   Charges:   PT Evaluation $PT Eval Moderate Complexity: 1 Mod PT Treatments $Therapeutic Activity: 8-22 mins        Tresa Endo PT Acute Rehabilitation  Services Pager [phone] Office [phone]   Claretha Cooper 07/03/2019, 4:44 PM

## 2019-07-04 ENCOUNTER — Ambulatory Visit
Admit: 2019-07-04 | Discharge: 2019-07-04 | Disposition: A | Discharge status: Home / Self Care | Payer: Medicare Other | Attending: Radiation Oncology | Admitting: Radiation Oncology

## 2019-07-04 DIAGNOSIS — F411 Generalized anxiety disorder: Secondary | ICD-10-CM

## 2019-07-04 DIAGNOSIS — K5903 Drug induced constipation: Secondary | ICD-10-CM

## 2019-07-04 DIAGNOSIS — T402X5A Adverse effect of other opioids, initial encounter: Secondary | ICD-10-CM

## 2019-07-04 DIAGNOSIS — F321 Major depressive disorder, single episode, moderate: Secondary | ICD-10-CM

## 2019-07-04 DIAGNOSIS — G893 Neoplasm related pain (acute) (chronic): Secondary | ICD-10-CM

## 2019-07-04 DIAGNOSIS — Z515 Encounter for palliative care: Secondary | ICD-10-CM

## 2019-07-04 DIAGNOSIS — I7789 Other specified disorders of arteries and arterioles: Secondary | ICD-10-CM

## 2019-07-04 LAB — CBC
HCT: 41.6 % (ref 39.0–52.0)
Hemoglobin: 13.6 g/dL (ref 13.0–17.0)
MCH: 31.2 pg (ref 26.0–34.0)
MCHC: 32.7 g/dL (ref 30.0–36.0)
MCV: 95.4 fL (ref 80.0–100.0)
Platelets: 258 10*3/uL (ref 150–400)
RBC: 4.36 MIL/uL (ref 4.22–5.81)
RDW: 12.9 % (ref 11.5–15.5)
WBC: 8.4 10*3/uL (ref 4.0–10.5)
nRBC: 0 % (ref 0.0–0.2)

## 2019-07-04 LAB — BASIC METABOLIC PANEL
Anion gap: 9 (ref 5–15)
BUN: 14 mg/dL (ref 8–23)
CO2: 24 mmol/L (ref 22–32)
Calcium: 8.4 mg/dL — ABNORMAL LOW (ref 8.9–10.3)
Chloride: 102 mmol/L (ref 98–111)
Creatinine, Ser: 0.63 mg/dL (ref 0.61–1.24)
GFR calc Af Amer: >60 mL/min (ref >60)
GFR calc non Af Amer: >60 mL/min (ref >60)
Glucose, Bld: 124 mg/dL — ABNORMAL HIGH (ref 70–99)
Potassium: 4.2 mmol/L (ref 3.5–5.1)
Sodium: 135 mmol/L (ref 135–145)

## 2019-07-04 MED ORDER — LIP MEDEX EX OINT
TOPICAL_OINTMENT | CUTANEOUS | Status: AC
  Filled 2019-07-04: qty 7

## 2019-07-04 MED ORDER — MAGNESIUM CITRATE PO SOLN
1.0000 | Freq: Once | ORAL | Status: AC
  Administered 2019-07-04: 1 via ORAL
  Filled 2019-07-04: qty 296

## 2019-07-04 MED ORDER — SENNA 8.6 MG PO TABS
2.0000 | ORAL_TABLET | Freq: Two times a day (BID) | ORAL | Status: DC
  Administered 2019-07-04 – 2019-07-06 (×4): 17.2 mg via ORAL
  Filled 2019-07-04 (×5): qty 2

## 2019-07-04 MED ORDER — HYDROMORPHONE HCL 1 MG/ML IJ SOLN: 1.0000 mg | INTRAMUSCULAR | Status: DC | PRN

## 2019-07-04 MED ORDER — DOCUSATE SODIUM 100 MG PO CAPS
100.0000 mg | ORAL_CAPSULE | Freq: Two times a day (BID) | ORAL | Status: DC
  Administered 2019-07-04 – 2019-07-06 (×5): 100 mg via ORAL
  Filled 2019-07-04 (×5): qty 1

## 2019-07-04 MED ORDER — POLYETHYLENE GLYCOL 3350 17 G PO PACK
17.0000 g | PACK | Freq: Every day | ORAL | Status: DC
  Administered 2019-07-04 – 2019-07-06 (×3): 17 g via ORAL
  Filled 2019-07-04 (×3): qty 1

## 2019-07-04 MED ORDER — HYDROMORPHONE HCL 4 MG PO TABS
4.0000 mg | ORAL_TABLET | ORAL | Status: DC | PRN
  Administered 2019-07-04 – 2019-07-06 (×13): 4 mg via ORAL
  Filled 2019-07-04 (×13): qty 1

## 2019-07-04 NOTE — TOC Initial Note (Signed)
Transition of Care (TOC) - Initial/Assessment Note    Patient Details  Name: Keith Olson. MRN: [id number] Date of Birth: [birth year]  Transition [facility]) CM/SW Contact:    Hargun Spurling, Marjie Skiff, RN Phone Number: 07/04/2019, 3:30 PM  Clinical Narrative:                 Pt from home with spouse whom he cares for as she has Alzheimer's. Pt is hoping to go to CIR for rehab, and is not interest in SNF at this time. Pt states that if he is not accepted by CIR that he would prefer to go home and would receive help from his son. CIR liaison to see pt this afternoon. TOC will continue to follow.  Expected Discharge Plan: IP Rehab Facility Barriers to Discharge: Continued Medical Work up   Patient Goals and CMS Choice Patient states their goals for this hospitalization and ongoing recovery are:: Go home      Expected Discharge Plan and Services Expected Discharge Plan: [place]   Discharge Planning Services: CM Consult   Living arrangements for the past 2 months: Single Family Home                                      Prior Living Arrangements/Services Living arrangements for the past 2 months: Single Family Home Lives with:: Spouse                   Activities of Daily Living Home Assistive Devices/Equipment: None ADL Screening (condition at time of admission) Patient's cognitive ability adequate to safely complete daily activities?: Yes Is the patient deaf or have difficulty hearing?: No Does the patient have difficulty seeing, even when wearing glasses/contacts?: Yes Does the patient have difficulty concentrating, remembering, or making decisions?: No Patient able to express need for assistance with ADLs?: Yes Does the patient have difficulty dressing or bathing?: No Independently performs ADLs?: Yes (appropriate for developmental age) Does the patient have difficulty walking or climbing stairs?: Yes Weakness of Legs: Both Weakness of Arms/Hands:  Both  Permission Sought/Granted                  Emotional Assessment       Orientation: : Oriented to Self, Oriented to Place, Oriented to  Time, Oriented to Situation      Admission diagnosis:  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 Hyponatremia 06/29/2019  . Acute bilateral low back pain with sciatica   . Multiple lung nodules on CT   . 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 Unstable angina ([place])   . History of depression 09/05/2017  . Hypothyroidism 09/05/2017  . Depression, major, single episode, moderate ([place]) 11/26/2016  . Prostatitis 11/26/2016  . Rectal bleeding 02/25/2016  . Pain management contract signed 01/27/2016  . Constipation 04/22/2015  . Lower abdominal pain 04/22/2015  . Anxiety state 12/16/2014  . Malignant neoplasm of prostate ([place]) 09/19/2014  . 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 Hyperlipidemia 05/22/2012  . BPH (benign prostatic hyperplasia) 05/22/2012  . Chest pain 02/24/2012  . CAD (coronary artery disease) 02/24/2012   PCP:  Dettinger,  Fransisca Kaufmann, MD Pharmacy:   [facility] [address], [place] - [place] [address] [place] [address] Phone: [phone] Fax: [phone]  [place], [place] - [address] [place] [address] Phone: [phone] Fax: [phone]     Social Determinants of Health (SDOH) Interventions    Readmission Risk Interventions No flowsheet data found.

## 2019-07-04 NOTE — Progress Notes (Signed)
Inpatient Rehab Admissions:  Inpatient Rehab Consult received.  I met with patient at the bedside for rehabilitation assessment and to discuss goals and expectations of an inpatient rehab admission.  At this time, he is open to pursuing insurance authorization for CIR, but hopeful that he will progress enough to be able to discharge home with family support and f/u with [facility] or OP therapies.  Note that pt appears to have made excellent progress with bed mobility and transfers, and this may be a possibility.  I will open insurance in the mean time and we will follow along either for possible rehab admission (pending insurance authorization and bed availability) or until pt progresses to a point that he can discharge home.    Signed: Shann Medal, PT, DPT Admissions Coordinator [phone] 07/04/19  4:34 PM

## 2019-07-04 NOTE — Evaluation (Signed)
Occupational Therapy Evaluation Patient Details Name: Keith Olson. MRN: [id number] DOB: [birth year] Today's Date: 07/04/2019    History of Present Illness 79 year old retired [place] [employer] who served in [employer] with a history of ischemic cardiomyopathy,prostate cancer status post radiation therapy (2016) who presented on 06/27/2019 with ongoing lower back and pelvic area pain for 6 months that acutely worsened over the past 2 weeks.Work-up included CT of chest abdomen pelvis was negative for PE new mass concerning for bronchogenic carcinoma with lymph node involvement.  MRI cervical/thoracic/lumbar spine showed extensive metastatic osseous lesions with T3 fracture but no cord compression, as well as L4 fracture with severe spinal stenosis and crowding of cauda equina.  PSA 239. Patient  was evaluated by neurosurgery who recommends nonoperative treatment by radiation and oncology.   Clinical Impression   Keith Olson presents with significant complaints of pain in back and RLE, weakness and decreased sensation of RLE, impaired balance and poor activity tolerance resulting in inability to perform independent transfers, ambulation and ADLs. Patient is typically independent and the main caregiver to his wife with dementia. Patient will benefit from skilled OT services to improve deficits and return to PLOF. Patient is highly motivated to improve in order to take care of his wife.    Follow Up Recommendations       Equipment Recommendations       Recommendations for Other Services Rehab consult     Precautions / Restrictions Precautions Precautions: Fall Precaution Comments: needs premeds, sensory deficits rt leg, knee may buckle, cannot feel foot to thigh Restrictions Weight Bearing Restrictions: No      Mobility Bed Mobility Overal bed mobility: Needs Assistance       Supine to sit: Supervision Sit to supine: Supervision   General bed mobility comments: Patient  required premedication to participate. Patient able to transfer to side of bed.  Transfers   Equipment used: Rolling walker (2 wheeled)   Sit to Stand: Min assist         General transfer comment: Min assist for sit to stand using Rw and verbal cues to push from bed. Patient able to take steps left and right at side of bed with use of RW - with episode of R knee buckling.    Balance   Sitting-balance support: No upper extremity supported;Feet supported Sitting balance-Leahy Scale: Good     Standing balance support: Bilateral upper extremity supported;During functional activity Standing balance-Leahy Scale: Poor                             ADL either performed or assessed with clinical judgement   ADL Overall ADL's : Needs assistance/impaired Eating/Feeding: Independent   Grooming: Set up   Upper Body Bathing: Set up   Lower Body Bathing: Minimal assistance   Upper Body Dressing : Set up   Lower Body Dressing: Minimal assistance   Toilet Transfer: Minimal assistance   Toileting- Clothing Manipulation and Hygiene: Moderate assistance       Functional mobility during ADLs: Minimal assistance General ADL Comments: Pain limits reaching feet more so on the right in regards to ADLs.     Vision Baseline Vision/History: Wears glasses Vision Assessment?: No apparent visual deficits     Perception     Praxis      Pertinent Vitals/Pain Pain Assessment: 0-10 Pain Score: 8  Pain Location: right pelvic area down the leg , increases with standing Pain Descriptors / Indicators: Discomfort;Cramping;Numbness;Shooting Pain  Intervention(s): Limited activity within patient's tolerance;Monitored during session     Hand Dominance Right   Extremity/Trunk Assessment Upper Extremity Assessment Upper Extremity Assessment: Overall WFL for tasks assessed   Lower Extremity Assessment Lower Extremity Assessment: Defer to PT evaluation       Communication  Communication Communication: No difficulties   Cognition Arousal/Alertness: Awake/alert Behavior During Therapy: WFL for tasks assessed/performed;Anxious Overall Cognitive Status: Within Functional Limits for tasks assessed                                     General Comments       Exercises     Shoulder Instructions      Home Living Family/patient expects to be discharged to:: Private residence Living Arrangements: Spouse/significant other(who has severe Alzheimer's per pt.)     Home Access: Stairs to enter [employer] of Steps: 1   Home Layout: Two level     Bathroom Shower/Tub: Teacher, early years/pre: Standard     Home Equipment: None   Additional Comments: wife has alzheiner's and requires total care by patient, now son and daughter in law are taking care of patient's wife.      Prior Functioning/Environment Level of Independence: Independent        Comments: until recently patient independent and caregiver to spouse.        OT Problem List: Decreased strength;Decreased activity tolerance;Impaired balance (sitting and/or standing);Pain      OT Treatment/Interventions:      OT Goals(Current goals can be found in the care plan section) Acute Rehab OT Goals Patient Stated Goal: to be able to walk and take care of my wife. OT Goal Formulation: With patient Time For Goal Achievement: 07/18/19 Potential to Achieve Goals: Fair  OT Frequency:     Barriers to D/C:            Co-evaluation              AM-PAC OT "6 Clicks" Daily Activity     Outcome Measure Help from another person eating meals?: None Help from another person taking care of personal grooming?: A Little Help from another person toileting, which includes using toliet, bedpan, or urinal?: A Lot Help from another person bathing (including washing, rinsing, drying)?: A Little Help from another person to put on and taking off regular upper body  clothing?: A Little Help from another person to put on and taking off regular lower body clothing?: A Little 6 Click Score: 18   End of Session Equipment Utilized During Treatment: Gait belt;Rolling walker Nurse Communication: (requests for pain medicine.)  Activity Tolerance: Patient limited by pain Patient left: in bed;with call bell/phone within reach;with bed alarm set  OT Visit Diagnosis: Unsteadiness on feet (R26.81);History of falling (Z91.81);Muscle weakness (generalized) (M62.81);Pain Pain - Right/Left: Right Pain - part of body: Leg;Hip                Time: 5852-7782 OT Time Calculation (min): 23 min Charges:  OT General Charges $OT Visit: 1 Visit OT Evaluation $OT Eval Moderate Complexity: 1 Mod  Han Vejar, OTR/L [place]  Office [phone]   Lenward Chancellor 07/04/2019, 12:23 PM

## 2019-07-04 NOTE — Consult Note (Signed)
Consultation Note Date: 07/04/2019   Patient Name: Keith Olson.  DOB: 06-28-1940  MRN: [id number]  Age / Sex: 79 y.o., male  PCP: Dettinger, Fransisca Kaufmann, MD Referring Physician: Mendel Corning, MD  Reason for Consultation: Establishing goals of care and Pain control  HPI/Patient Profile: 79 y.o. male  with past medical history of ischemic cardiomyopathy and prostate cancer presented on 06/27/2019 with lower back and pelvic pain for 6 months that had progressively worsened over the last 2 weeks. CT of chest showed new mass concerning for bronchogenic carcinoma with lymph node involvement. MRI spine showed extensive metastatic osseous lesions with T3 fracture but no cord compression, as well as L4 fracture with severe spinal stenosis and crowding of cauda equina. He underwent bronchoscopy with transbronchial biopsy to determine if metastasis is from recurrent prostate CA versus new lung CA is the primary source.   Current treatment includes: - Decadron 4 mg twice daily - Bicalutamide 50 mg daily  - Radiation to L4 vertebrae with plan for total 30 Gy in 10 fractions (first session 06/28/19  Clinical Assessment and Goals of Care:  I have reviewed medical records including EPIC notes, labs and imaging, examined the patient, and met at bedside with the patient's wife and son  to discuss diagnosis, prognosis, GOC, disposition and options.  I introduced Palliative Medicine as specialized medical care for people living with serious illness. It focuses on providing relief from the symptoms and stress of a serious illness.   I discussed a brief life review of the patient. He lives in  at home with his wife. They have 5 children; 4 sons and 1 daughter. He was a Company secretary and served in [place], where he was exposed to [employer]. After his time in the [employer], he worked as a Engineer, building services for many years. His spiritual  practice is [employer]. He states that his faith and his family are very important to him.   I discussed his current diagnosis and what it means in the larger context of an illness that will not go away. Patient and his family are aware of the severity of his illness and acknowledge it is a problem that will not go away.     I attempted to elicit values and goals of care important to the patient.   Goals of care: To manage his pain so that he can have the highest level of functioning that he can for as long as he can.    Regarding pain management, the patient reports pain in his back that radiates down the left leg to the bottom of the foot. He describes it as dull and achy, with a severity of 9-10. After IV dilaudid is given, he reports relief with pain level decreasing to 5-6. He does not report any relief after taking oxycodone. He reports not having a BM in 5 days.   Advanced directives were considered and discussed. The patient was asked to think about who would make decisions for him if he became unable to make  those decisions for himself; and he states it would be his son. Pastoral care services were offered to assist with this.   Questions and concerns were addressed. The family was encouraged to call with questions or concerns.   Primary decision maker: Patient    SUMMARY OF RECOMMENDATIONS   - start dilaudid 4 mg p.o. every 3 hours prn (first line for pain control) - dilaudid 1 mg IV every 3 hours prn (second line to be given only if there is not adequate pain relief 45 minutes after dilaudid p.o. is given) - discontinue oxycodone - continue fentanyl patch 25 mcg q72 hours - start senna 2 tablets twice daily - spiritual care consult for assistance with advance directives  Code Status/Advance Care Planning:  Full code   Palliative Prophylaxis:   Bowel Regimen and Frequent Pain Assessment   Psycho-social/Spiritual:   Desire for further Chaplaincy support:yes  Prognosis:    Unable to determine  Discharge Planning: To Be Determined      Primary Diagnoses: Present on Admission: . Anxiety state . CAD (coronary artery disease) . Hyperlipidemia . Hypothyroidism . Malignant neoplasm of prostate ([place]) . 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 The following aspects are pertinent.  Past Medical History:  Diagnosis Date  . Agent orange exposure   . Arthritis   . Coronary artery disease    BMS to mid LAD 2001, DES to proximal LAD 2017  . Enlarged prostate    XRT 2016  . Headache    Sinus headaches   . History of depression   . Hyperlipidemia   . Hypothyroidism   . Prostate cancer ([place]) 07/2014   hormonal therapy, external beam radiation therapy  . PTSD (post-traumatic stress disorder)    Social History   Socioeconomic History  . Marital status: Married    Spouse name: Not on file  . Number of children: 5  . Years of education: Not on file  . Highest education level: Not on file  Occupational History  . Occupation: retired  Tobacco Use  . Smoking status: Never Smoker  . Smokeless tobacco: Former Systems developer    Types: Chew  Substance and Sexual Activity  . Alcohol use: No  . Drug use: No  . Sexual activity: Yes  Other Topics Concern  . Not on file  Social History Narrative   Married   5 children    Family History  Problem Relation Age of Onset  . Arthritis Mother   . Heart attack Father    Scheduled Meds: . acetaminophen  650 mg Oral Q6H  . aspirin EC  81 mg Oral Daily  . bicalutamide  50 mg Oral Daily  . Chlorhexidine Gluconate Cloth  6 each Topical Daily  . dexamethasone (DECADRON) injection  4 mg Intravenous Q12H  . docusate sodium  100 mg Oral BID  . fentaNYL  1 patch Transdermal Q72H  . gabapentin  400 mg Oral TID  . heparin  5,000 Units Subcutaneous Q8H  . levothyroxine  75 mcg Oral  QAC breakfast  . lidocaine  1 patch Transdermal Q24H  . pantoprazole  40 mg Oral Daily  . polyethylene glycol  17 g Oral Daily  . rosuvastatin  40 mg Oral Daily  . senna  2 tablet Oral BID  . tamsulosin  0.4 mg Oral QHS   Continuous Infusions: . lactated ringers Stopped (07/02/19 1800)  PRN Meds:.ALPRAZolam, gadobutrol, HYDROmorphone (DILAUDID) injection, HYDROmorphone   Allergies  Allergen Reactions  . Lipitor [Atorvastatin]     Muscles aches   Review of Systems  Musculoskeletal: Positive for back pain.  Neurological: Positive for weakness and numbness.    Physical Exam Vitals reviewed.  Constitutional:      General: He is not in acute distress. HENT:     Head: Normocephalic and atraumatic.  Pulmonary:     Effort: Pulmonary effort is normal.  Musculoskeletal:     Right lower leg: No edema.     Left lower leg: No edema.  Skin:    General: Skin is warm and dry.  Neurological:     Mental Status: He is alert and oriented to person, place, and time.     Vital Signs: BP 135/73 (BP Location: Left Arm)   Pulse 64   Temp 97.8 F (36.6 C) (Oral)   Resp 18   Ht _0  (1.727 m)   Wt 65.8 kg   SpO2 93%   BMI 22.05 kg/m  Pain Scale: 0-10   Pain Score: 8    SpO2: SpO2: 93 % O2 Device:SpO2: 93 % O2 Flow Rate: .O2 Flow Rate (L/min): 2 L/min  IO: Intake/output summary:   Intake/Output Summary (Last 24 hours) at 07/04/2019 1333 Last data filed at 07/04/2019 0545 Gross per 24 hour  Intake 240 ml  Output 2475 ml  Net -2235 ml    LBM: Last BM Date: 07/01/19 Baseline Weight: Weight: 65.8 kg Most recent weight: Weight: 65.8 kg      Palliative Assessment/Data: 70%    Time In: 11:00 Time Out: 12:10 Time Total: 70 minutes  Greater than 50%  of this time was spent counseling and coordinating care related to the above assessment and plan.  Micheline Rough, MD [facility] Team [phone]   Please contact Palliative Medicine Team phone at  [phone] for questions and concerns.  For individual provider: See Shea Evans

## 2019-07-04 NOTE — Progress Notes (Signed)
[facility]                                                                              Patient Demographics  Keith Olson, is a 79 y.o. male, DOB - 1940-03-20, TIR:[id number]  Admit date - 06/27/2019   Admitting Physician Albertine Patricia, MD  Outpatient Primary MD for the patient is Dettinger, Fransisca Kaufmann, MD  Outpatient specialists:   LOS - 7  days   Medical records reviewed and are as summarized below:    Chief Complaint  Patient presents with  . Back Pain       Brief summary   79 year old retired [place] [employer] who served in [employer] with a history of ischemic cardiomyopathy,prostate cancer status post radiation therapy (2016) who presented on 4/28/2021with ongoing lower back and pelvic area pain for 6 months that acutely worsened over the past 2 weeks. He  was previously treated by his PCP with Vicodin with no improvement in pain and recommended to go to ED. Patient presented with worsening back pain, right lower extremity numbness and bilateral lower extremity weakness.  Work-up included CT of chest abdomen pelvis was negative for PE new mass concerning for bronchogenic carcinoma with lymph node involvement. MRI cervical/thoracic/lumbar spine showed extensive metastatic osseous lesions with T3 fracture but no cord compression, as well as L4 fracture with severe spinal stenosis and crowding of cauda equina.  PSA 239.Patient was transferred from [facility] on 4/28 where he was evaluated by neurosurgery who recommends nonoperative treatment by radiation and oncology, ?if metastasis from recurrent prostate versus new primary lung CA.  He was started on Decadron 4 mg twice daily along with bicalutamide 50 mg daily x 28 days with lupron shots to start at approximately 14 days into treatment (likely to be administered in outpatient setting). Radiation to L4 vertebrae started with plan for total 30 Gy in 10 fractions with first session 06/28/2019. He  underwent bronchoscopy with transbronchial biopsy 08/05/6193 to ascertain if prostate versus lung is the primary.   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 -Per neurosurgery, not a candidate for surgical intervention, given all levels of lumbar spine infiltrated with tumor, no urgent need for disc decompression with no red flag symptoms, started on XRT on 4/29 -Currently undergoing palliative XRT by radiation oncology -Continue IV Decadron -Oncology following, seen by Dr. Lorenso Courier, on Casodex 50 mg daily for 28 days, with Lupron shots to started approximately 14 days into treatment -EBUS performed 5/3, results pending  Acute severe back pain -Continue fentanyl patch, Decadron, IV and oral Dilaudid  -Palliative care consulted for goals of care and pain management -PT recommended home health PT versus CIR.  Discussed with the patient in detail, interested in CIR, consult placed.  Pain level too high for safe discharge to home  Constipation -Placed on bowel regimen, patient reports no BM in the last few days  Hypothyroidism Continue Synthroid  GERD Continue  PPI  Hyperlipidemia Continue simvastatin  BPH Continue Flomax  CAD No acute anginal symptoms, continue aspirin  Code Status: Full code DVT Prophylaxis:  Lovenox  Family Communication: Discussed all imaging results, lab results, explained to the patient   Disposition Plan:     Status is: Inpatient  Remains inpatient appropriate because:Ongoing active pain requiring inpatient pain management   Dispo: The patient is from: Home              Anticipated d/c is to: CIR              Anticipated d/c date is: 2 days              Patient currently is not medically stable to  d/c.       Time Spent in minutes 35 minutes  Procedures:  Bronchoscopy/EBUS, biopsy on 5/3 XRT  Consultants:   Oncology Radiation oncology Pulmonology Palliative medicine  Antimicrobials:   Anti-infectives (From admission, onward)   None          Medications  Scheduled Meds: . acetaminophen  650 mg Oral Q6H  . aspirin EC  81 mg Oral Daily  . bicalutamide  50 mg Oral Daily  . Chlorhexidine Gluconate Cloth  6 each Topical Daily  . dexamethasone (DECADRON) injection  4 mg Intravenous Q12H  . docusate sodium  100 mg Oral BID  . fentaNYL  1 patch Transdermal Q72H  . gabapentin  400 mg Oral TID  . heparin  5,000 Units Subcutaneous Q8H  . levothyroxine  75 mcg Oral QAC breakfast  . lidocaine  1 patch Transdermal Q24H  . pantoprazole  40 mg Oral Daily  . polyethylene glycol  17 g Oral Daily  . rosuvastatin  40 mg Oral Daily  . senna  2 tablet Oral BID  . tamsulosin  0.4 mg Oral QHS   Continuous Infusions: . lactated ringers Stopped (07/02/19 1800)   PRN Meds:.ALPRAZolam, gadobutrol, HYDROmorphone (DILAUDID) injection, HYDROmorphone      Subjective:   Keith Olson was seen and examined today.  Per patient, pain still 8-9/10.  No acute neurological deficits.  No fevers or chills. Patient denies dizziness, chest pain, shortness of breath, abdominal pain, N/V.  No acute events overnight.  + Constipation  Objective:   Vitals:   07/03/19 0334 07/03/19 1458 07/03/19 2035 07/04/19 0530  BP: (!) 142/73 123/69 (!) 141/75 135/73  Pulse: 66 61 (!) 56 64  Resp: 16 16 19 18   Temp: 98 F (36.7 C) 98.1 F (36.7 C) 97.9 F (36.6 C) 97.8 F (36.6 C)  TempSrc: Oral Oral Oral Oral  SpO2: 94% 93% 96% 93%  Weight:      Height:        Intake/Output Summary (Last 24 hours) at 07/04/2019 1322 Last data filed at 07/04/2019 0545 Gross per 24 hour  Intake 240 ml  Output 2475 ml  Net -2235 ml     Wt Readings from Last 3 Encounters:  07/02/19 65.8 kg  05/04/19 67.4  kg  11/13/18 69 kg     Exam  General: Alert and oriented x 3, NAD  Cardiovascular: S1 S2 auscultated, no murmurs, RRR  Respiratory: Clear to auscultation bilaterally, no wheezing, rales or rhonchi  Gastrointestinal: Soft, nontender, nondistended, + bowel sounds  Ext: no pedal edema bilaterally  Neuro: No new FND's  Musculoskeletal: No digital cyanosis, clubbing  Skin: No rashes  Psych: Normal affect and demeanor, alert and oriented x3    Data Reviewed:  I have personally  reviewed following labs and imaging studies  Micro Results Recent Results (from the past 240 hour(s))  Respiratory Panel by RT PCR (Flu A&B, Covid) - Nasopharyngeal Swab     Status: None   Collection Time: 06/27/19  8:23 PM   Specimen: Nasopharyngeal Swab  Result Value Ref Range Status   SARS Coronavirus 2 by RT PCR NEGATIVE NEGATIVE Final    Comment: (NOTE) SARS-CoV-2 target nucleic acids are NOT DETECTED. The SARS-CoV-2 RNA is generally detectable in upper respiratoy specimens during the acute phase of infection. The lowest concentration of SARS-CoV-2 viral copies this assay can detect is 131 copies/mL. A negative result does not preclude SARS-Cov-2 infection and should not be used as the sole basis for treatment or other patient management decisions. A negative result may occur with  improper specimen collection/handling, submission of specimen other than nasopharyngeal swab, presence of viral mutation(s) within the areas targeted by this assay, and inadequate number of viral copies (<131 copies/mL). A negative result must be combined with clinical observations, patient history, and epidemiological information. The expected result is Negative. Fact Sheet for Patients:  PinkCheek.be Fact Sheet for Healthcare Providers:  GravelBags.it This test is not yet ap proved or cleared by the [place] FDA and  has been authorized for detection  and/or diagnosis of SARS-CoV-2 by FDA under an Emergency Use Authorization (EUA). This EUA will remain  in effect (meaning this test can be used) for the duration of the COVID-19 declaration under Section 564(b)(1) of the Act, 21 U.S.C. section 360bbb-3(b)(1), unless the authorization is terminated or revoked sooner.    Influenza A by PCR NEGATIVE NEGATIVE Final   Influenza B by PCR NEGATIVE NEGATIVE Final    Comment: (NOTE) The Xpert Xpress SARS-CoV-2/FLU/RSV assay is intended as an aid in  the diagnosis of influenza from Nasopharyngeal swab specimens and  should not be used as a sole basis for treatment. Nasal washings and  aspirates are unacceptable for Xpert Xpress SARS-CoV-2/FLU/RSV  testing. Fact Sheet for Patients: PinkCheek.be Fact Sheet for Healthcare Providers: GravelBags.it This test is not yet approved or cleared by the [place] FDA and  has been authorized for detection and/or diagnosis of SARS-CoV-2 by  FDA under an Emergency Use Authorization (EUA). This EUA will remain  in effect (meaning this test can be used) for the duration of the  Covid-19 declaration under Section 564(b)(1) of the Act, 21  U.S.C. section 360bbb-3(b)(1), unless the authorization is  terminated or revoked. Performed at [facility], [address] [place], [place] [address]     Radiology Reports MR CERVICAL SPINE W WO CONTRAST  Result Date: 06/27/2019 CLINICAL DATA:  Back pain, history of prostate cancer, new suspected lung cancer EXAM: MRI CERVICAL, THORACIC AND LUMBAR SPINE WITHOUT AND WITH CONTRAST CONTRAST:  7 mL Gadavist TECHNIQUE: Multiplanar and multiecho pulse sequences of the cervical spine, to include the craniocervical junction and cervicothoracic junction, and thoracic and lumbar spine, were obtained without intravenous contrast. COMPARISON:  None. FINDINGS: MRI CERVICAL SPINE Motion artifact is present Alignment:  Anteroposterior alignment is maintained. Vertebrae: There is abnormal marrow signal at multiple levels with greatest involvement of C3 and C4 vertebral bodies. There no compression deformity. No significant epidural disease noted. Cord: No definite abnormal signal within the above limitation. Posterior Fossa, vertebral arteries, paraspinal tissues: Unremarkable. Disc levels: Multilevel disc bulges, endplate osteophytes, and facet and uncovertebral hypertrophy. There is no high-grade canal stenosis. Multilevel foraminal stenosis is present and poorly evaluated due to artifact. MRI THORACIC SPINE Motion  artifact is present Alignment:  Anteroposterior alignment is maintained. Vertebrae: Multifocal abnormal marrow signal with greatest involvement of T2-T4 and T11 and T12. There is mild to moderate compression deformity of T3 with less than 50% loss of height. Probable mild ventral epidural extension at this level. There is some foraminal extension at T2-T3, T3-T4, and T4-T5 levels. Cord:  No abnormal signal within the above limitation. Paraspinal and other soft tissues: Intrathoracic findings are better evaluated on the prior chest CT. Disc levels: Mild degenerative changes are present without significant degenerative stenosis. MRI LUMBAR SPINE Motion artifact is present. Segmentation: Standard. Alignment:  Mild degenerative listhesis. Vertebrae: There is multifocal abnormal marrow signal throughout the lumbosacral spine and imaged bony pelvis. There is mild compression deformity of L4 with less than 50% loss of height. Ventral epidural disease is present at this level with resulting severe canal stenosis and crowding of cauda equina. Conus medullaris and cauda equina: Conus extends to the L1 level. Paraspinal and other soft tissues: Unremarkable Disc levels: L1-L2:  Disc bulge.  No significant canal or foraminal stenosis. L2-L3: Disc bulge with endplate osteophytic ridging and facet arthropathy with ligamentum flavum  infolding. Moderate canal stenosis. Mild foraminal stenosis. L3-L4: Disc bulge with endplate osteophytic ridging and marked facet arthropathy with ligamentum flavum infolding. Marked canal stenosis with effacement of the lateral recesses. Moderate foraminal stenosis. L4-L5: Disc bulge with endplate osteophytic ridging and moderate right and marked left facet arthropathy with ligamentum flavum infolding. Moderate to marked canal stenosis. Moderate to marked foraminal stenosis. L5-S1: Disc bulge with endplate osteophytic ridging and moderate facet arthropathy. No canal stenosis. Moderate to marked foraminal stenosis. IMPRESSION: Motion degraded study with suboptimal evaluation. Diffuse osseous metastatic disease. Mild to moderate compression deformity of T3 with mild ventral epidural extension. No cord compression. Mild compression deformity of L4 with ventral epidural extension resulting in severe canal stenosis and crowding of cauda equina. Multilevel degenerative changes, greatest at lumbar levels with significant canal stenosis at L3-L4 and L4-L5. These results were called by telephone at the time of interpretation on 06/27/2019 at 6:21 pm to provider Dr. Vanita Panda, who verbally acknowledged these results. Electronically Signed   By: Macy Mis M.D.   On: 06/27/2019 18:22   MR THORACIC SPINE W WO CONTRAST  Result Date: 06/27/2019 CLINICAL DATA:  Back pain, history of prostate cancer, new suspected lung cancer EXAM: MRI CERVICAL, THORACIC AND LUMBAR SPINE WITHOUT AND WITH CONTRAST CONTRAST:  7 mL Gadavist TECHNIQUE: Multiplanar and multiecho pulse sequences of the cervical spine, to include the craniocervical junction and cervicothoracic junction, and thoracic and lumbar spine, were obtained without intravenous contrast. COMPARISON:  None. FINDINGS: MRI CERVICAL SPINE Motion artifact is present Alignment: Anteroposterior alignment is maintained. Vertebrae: There is abnormal marrow signal at multiple levels  with greatest involvement of C3 and C4 vertebral bodies. There no compression deformity. No significant epidural disease noted. Cord: No definite abnormal signal within the above limitation. Posterior Fossa, vertebral arteries, paraspinal tissues: Unremarkable. Disc levels: Multilevel disc bulges, endplate osteophytes, and facet and uncovertebral hypertrophy. There is no high-grade canal stenosis. Multilevel foraminal stenosis is present and poorly evaluated due to artifact. MRI THORACIC SPINE Motion artifact is present Alignment:  Anteroposterior alignment is maintained. Vertebrae: Multifocal abnormal marrow signal with greatest involvement of T2-T4 and T11 and T12. There is mild to moderate compression deformity of T3 with less than 50% loss of height. Probable mild ventral epidural extension at this level. There is some foraminal extension at T2-T3, T3-T4, and T4-T5 levels. Cord:  No abnormal signal within the above limitation. Paraspinal and other soft tissues: Intrathoracic findings are better evaluated on the prior chest CT. Disc levels: Mild degenerative changes are present without significant degenerative stenosis. MRI LUMBAR SPINE Motion artifact is present. Segmentation: Standard. Alignment:  Mild degenerative listhesis. Vertebrae: There is multifocal abnormal marrow signal throughout the lumbosacral spine and imaged bony pelvis. There is mild compression deformity of L4 with less than 50% loss of height. Ventral epidural disease is present at this level with resulting severe canal stenosis and crowding of cauda equina. Conus medullaris and cauda equina: Conus extends to the L1 level. Paraspinal and other soft tissues: Unremarkable Disc levels: L1-L2:  Disc bulge.  No significant canal or foraminal stenosis. L2-L3: Disc bulge with endplate osteophytic ridging and facet arthropathy with ligamentum flavum infolding. Moderate canal stenosis. Mild foraminal stenosis. L3-L4: Disc bulge with endplate osteophytic  ridging and marked facet arthropathy with ligamentum flavum infolding. Marked canal stenosis with effacement of the lateral recesses. Moderate foraminal stenosis. L4-L5: Disc bulge with endplate osteophytic ridging and moderate right and marked left facet arthropathy with ligamentum flavum infolding. Moderate to marked canal stenosis. Moderate to marked foraminal stenosis. L5-S1: Disc bulge with endplate osteophytic ridging and moderate facet arthropathy. No canal stenosis. Moderate to marked foraminal stenosis. IMPRESSION: Motion degraded study with suboptimal evaluation. Diffuse osseous metastatic disease. Mild to moderate compression deformity of T3 with mild ventral epidural extension. No cord compression. Mild compression deformity of L4 with ventral epidural extension resulting in severe canal stenosis and crowding of cauda equina. Multilevel degenerative changes, greatest at lumbar levels with significant canal stenosis at L3-L4 and L4-L5. These results were called by telephone at the time of interpretation on 06/27/2019 at 6:21 pm to provider Dr. Vanita Panda, who verbally acknowledged these results. Electronically Signed   By: Macy Mis M.D.   On: 06/27/2019 18:22   MR Lumbar Spine W Wo Contrast  Result Date: 06/27/2019 CLINICAL DATA:  Back pain, history of prostate cancer, new suspected lung cancer EXAM: MRI CERVICAL, THORACIC AND LUMBAR SPINE WITHOUT AND WITH CONTRAST CONTRAST:  7 mL Gadavist TECHNIQUE: Multiplanar and multiecho pulse sequences of the cervical spine, to include the craniocervical junction and cervicothoracic junction, and thoracic and lumbar spine, were obtained without intravenous contrast. COMPARISON:  None. FINDINGS: MRI CERVICAL SPINE Motion artifact is present Alignment: Anteroposterior alignment is maintained. Vertebrae: There is abnormal marrow signal at multiple levels with greatest involvement of C3 and C4 vertebral bodies. There no compression deformity. No significant  epidural disease noted. Cord: No definite abnormal signal within the above limitation. Posterior Fossa, vertebral arteries, paraspinal tissues: Unremarkable. Disc levels: Multilevel disc bulges, endplate osteophytes, and facet and uncovertebral hypertrophy. There is no high-grade canal stenosis. Multilevel foraminal stenosis is present and poorly evaluated due to artifact. MRI THORACIC SPINE Motion artifact is present Alignment:  Anteroposterior alignment is maintained. Vertebrae: Multifocal abnormal marrow signal with greatest involvement of T2-T4 and T11 and T12. There is mild to moderate compression deformity of T3 with less than 50% loss of height. Probable mild ventral epidural extension at this level. There is some foraminal extension at T2-T3, T3-T4, and T4-T5 levels. Cord:  No abnormal signal within the above limitation. Paraspinal and other soft tissues: Intrathoracic findings are better evaluated on the prior chest CT. Disc levels: Mild degenerative changes are present without significant degenerative stenosis. MRI LUMBAR SPINE Motion artifact is present. Segmentation: Standard. Alignment:  Mild degenerative listhesis. Vertebrae: There is multifocal abnormal marrow signal throughout the lumbosacral spine and  imaged bony pelvis. There is mild compression deformity of L4 with less than 50% loss of height. Ventral epidural disease is present at this level with resulting severe canal stenosis and crowding of cauda equina. Conus medullaris and cauda equina: Conus extends to the L1 level. Paraspinal and other soft tissues: Unremarkable Disc levels: L1-L2:  Disc bulge.  No significant canal or foraminal stenosis. L2-L3: Disc bulge with endplate osteophytic ridging and facet arthropathy with ligamentum flavum infolding. Moderate canal stenosis. Mild foraminal stenosis. L3-L4: Disc bulge with endplate osteophytic ridging and marked facet arthropathy with ligamentum flavum infolding. Marked canal stenosis with  effacement of the lateral recesses. Moderate foraminal stenosis. L4-L5: Disc bulge with endplate osteophytic ridging and moderate right and marked left facet arthropathy with ligamentum flavum infolding. Moderate to marked canal stenosis. Moderate to marked foraminal stenosis. L5-S1: Disc bulge with endplate osteophytic ridging and moderate facet arthropathy. No canal stenosis. Moderate to marked foraminal stenosis. IMPRESSION: Motion degraded study with suboptimal evaluation. Diffuse osseous metastatic disease. Mild to moderate compression deformity of T3 with mild ventral epidural extension. No cord compression. Mild compression deformity of L4 with ventral epidural extension resulting in severe canal stenosis and crowding of cauda equina. Multilevel degenerative changes, greatest at lumbar levels with significant canal stenosis at L3-L4 and L4-L5. These results were called by telephone at the time of interpretation on 06/27/2019 at 6:21 pm to provider Dr. Vanita Panda, who verbally acknowledged these results. Electronically Signed   By: Macy Mis M.D.   On: 06/27/2019 18:22   CT Angio Chest/Abd/Pel for Dissection W and/or Wo Contrast  Result Date: 06/27/2019 CLINICAL DATA:  79 year old male with history of severe lower abdominal and back pain. EXAM: CT ANGIOGRAPHY CHEST, ABDOMEN AND PELVIS TECHNIQUE: Non-contrast CT of the chest was initially obtained. Multidetector CT imaging through the chest, abdomen and pelvis was performed using the standard protocol during bolus administration of intravenous contrast. Multiplanar reconstructed images and MIPs were obtained and reviewed to evaluate the vascular anatomy. CONTRAST:  1102mL OMNIPAQUE IOHEXOL 350 MG/ML SOLN COMPARISON:  CT the abdomen and pelvis 04/20/2012. FINDINGS: CTA CHEST FINDINGS Cardiovascular: Precontrast images demonstrate no crescentic high attenuation associated with the wall of the thoracic aorta to suggest acute intramural hematoma. No aneurysm  or dissection of the thoracic aorta. 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 Mid thoracic aortic arch measures 2.9 cm in diameter. Descending thoracic aorta measures 2.8 cm in diameter. Heart size is normal. There is no significant pericardial fluid, thickening or pericardial calcification. There is aortic atherosclerosis, as well as atherosclerosis of the great vessels of the mediastinum and the coronary arteries, including calcified atherosclerotic plaque in the left main, left anterior descending, left circumflex and right coronary arteries. Mediastinum/Nodes: Enlarged AP window lymph node measuring 1.7 cm in short axis. Borderline enlarged left hilar lymph nodes measuring up to 1.3 cm in short axis. Small hiatal hernia. No axillary lymphadenopathy. Lungs/Pleura: Multiple pulmonary nodules are noted, largest of which is in the left upper lobe (axial image 63 of series 7 and sagittal image 141 of series 10) measuring 2.5 x 1.6 x 1.5 cm. This lesion has macrolobulated slightly ill-defined margins. Several other smaller pulmonary nodules are noted including a 7 x 4 mm left upper lobe nodule (axial image 65 of series 7), and a 1.0 x 0.5 cm nodule in the superior segment of the right lower lobe (axial image 44 of series 7). No acute consolidative airspace disease. No pleural effusions. Musculoskeletal: There are no aggressive appearing lytic  or blastic lesions noted in the visualized portions of the skeleton. Review of the MIP images confirms the above findings. CTA ABDOMEN AND PELVIS FINDINGS VASCULAR Aorta: Normal caliber aorta without aneurysm, dissection, vasculitis or significant stenosis. Extensive atherosclerotic disease throughout the abdominal aorta. Celiac: Patent without evidence of dissection, vasculitis or significant stenosis. Mild ectasia of the proximal celiac axis which measures 8 mm in diameter. SMA: Patent without evidence of aneurysm, dissection, vasculitis or significant  stenosis. Renals: Both renal arteries are patent without evidence of aneurysm, dissection, vasculitis, fibromuscular dysplasia or significant stenosis. IMA: Patent without evidence of aneurysm, dissection, vasculitis or significant stenosis. Inflow: Patent without evidence of aneurysm, dissection, vasculitis or significant stenosis. Veins: No obvious venous abnormality within the limitations of this arterial phase study. Review of the MIP images confirms the above findings. NON-VASCULAR Hepatobiliary: 1.2 cm low-attenuation lesion in segment 2 of the liver, compatible with a simple cyst. No other larger more suspicious appearing pulmonary nodules or masses are noted. No intra or extrahepatic biliary ductal dilatation. Gallbladder is normal in appearance. Pancreas: No pancreatic mass. No pancreatic ductal dilatation. No pancreatic or peripancreatic fluid collections or inflammatory changes. Spleen: Unremarkable. Adrenals/Urinary Tract: Subcentimeter low-attenuation lesion in the posterior aspect of the interpolar region of the left kidney, too small to characterize, but statistically likely to represent a tiny cyst. Right kidney and bilateral adrenal glands are normal in appearance. No hydroureteronephrosis. Urinary bladder is normal in appearance. Stomach/Bowel: Normal appearance of the stomach. No pathologic dilatation of small bowel or colon. Numerous colonic diverticulae are noted, without surrounding inflammatory changes to suggest an acute diverticulitis at this time. The appendix is not confidently identified and may be surgically absent. Regardless, there are no inflammatory changes noted adjacent to the cecum to suggest the presence of an acute appendicitis at this time. Lymphatic: No lymphadenopathy noted in the abdomen or pelvis. Reproductive: Fiducial markers in the prostate gland. Other: No significant volume of ascites.  No pneumoperitoneum. Musculoskeletal: There are no aggressive appearing lytic or  blastic lesions noted in the visualized portions of the skeleton. Review of the MIP images confirms the above findings. IMPRESSION: 1. No acute findings are noted in the chest, abdomen or pelvis to account for the patient's history of abdominal pain. 2. Multiple pulmonary nodules in the lungs bilaterally, largest of which is a left upper lobe pulmonary nodule measuring 2.5 x 1.6 x 1.5 cm. This is associated with left hilar and AP window lymphadenopathy. 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 Further evaluation with PET-CT is recommended in the near future. No definite signs of metastatic disease in the abdomen or pelvis. 3. Colonic diverticulosis without evidence of acute diverticulitis at this time. 4. Aortic atherosclerosis, in addition to left main and 3 vessel coronary artery disease. Assessment for potential risk factor modification, dietary therapy or pharmacologic therapy may be warranted, if clinically indicated. 5. 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 Recommend annual imaging followup by CTA or MRA. This recommendation follows 2010 ACCF/AHA/AATS/ACR/ASA/SCA/SCAI/SIR/STS/SVM Guidelines for the Diagnosis and Management of Patients with Thoracic Aortic Disease. Circulation. 2010; 121: I097-D532. Aortic aneurysm NOS (ICD10-I71.9). 6. Additional incidental findings, as above. Electronically Signed   By: Vinnie Langton M.D.   On: 06/27/2019 12:26    Lab Data:  CBC: Recent Labs  Lab 06/29/19 0456 06/30/19 0549 07/04/19 0503  WBC 13.2* 9.9 8.4  HGB 13.3 13.6 13.6  HCT 39.8 40.5 41.6  MCV 94.3 93.8 95.4  PLT 202 196 169   Basic Metabolic Panel: Recent Labs  Lab 06/29/19 0456 06/30/19 0549 07/04/19 0503  NA 129* 130* 135  K 4.1 3.9 4.2  CL 96* 97* 102  CO2 25 23 24   GLUCOSE 121* 153* 124*  BUN 20 15 14   CREATININE 0.73 0.66 0.63  CALCIUM 8.7* 8.7* 8.4*    GFR: Estimated Creatinine Clearance: 72 mL/min (by C-G formula based on SCr of 0.63 mg/dL). Liver Function Tests: No results for input(s): AST, ALT, ALKPHOS, BILITOT, PROT, ALBUMIN in the last 168 hours. No results for input(s): LIPASE, AMYLASE in the last 168 hours. No results for input(s): AMMONIA in the last 168 hours. Coagulation Profile: No results for input(s): INR, PROTIME in the last 168 hours. Cardiac Enzymes: No results for input(s): CKTOTAL, CKMB, CKMBINDEX, TROPONINI in the last 168 hours. BNP (last 3 results) No results for input(s): PROBNP in the last 8760 hours. HbA1C: No results for input(s): HGBA1C in the last 72 hours. CBG: No results for input(s): GLUCAP in the last 168 hours. Lipid Profile: No results for input(s): CHOL, HDL, LDLCALC, TRIG, CHOLHDL, LDLDIRECT in the last 72 hours. Thyroid Function Tests: No results for input(s): TSH, T4TOTAL, FREET4, T3FREE, THYROIDAB in the last 72 hours. Anemia Panel: No results for input(s): VITAMINB12, FOLATE, FERRITIN, TIBC, IRON, RETICCTPCT in the last 72 hours. Urine analysis:    Component Value Date/Time   COLORURINE YELLOW 06/27/2019 1756   APPEARANCEUR CLEAR 06/27/2019 1756   APPEARANCEUR Clear 05/22/2018 1547   LABSPEC 1.043 (H) 06/27/2019 1756   PHURINE 6.0 06/27/2019 1756   GLUCOSEU NEGATIVE 06/27/2019 1756   HGBUR NEGATIVE 06/27/2019 1756   BILIRUBINUR NEGATIVE 06/27/2019 1756   BILIRUBINUR Negative 05/22/2018 1547   KETONESUR 5 (A) 06/27/2019 1756   PROTEINUR NEGATIVE 06/27/2019 1756   UROBILINOGEN negative 03/07/2014 1059   NITRITE NEGATIVE 06/27/2019 1756   LEUKOCYTESUR NEGATIVE 06/27/2019 1756     Westin Knotts M.D. [facility] 07/04/2019, 1:22 PM   Call night coverage person covering after 7pm

## 2019-07-05 ENCOUNTER — Other Ambulatory Visit: Payer: Self-pay | Admitting: Oncology

## 2019-07-05 ENCOUNTER — Ambulatory Visit
Admit: 2019-07-05 | Discharge: 2019-07-05 | Disposition: A | Discharge status: Home / Self Care | Payer: Medicare Other | Attending: Radiation Oncology | Admitting: Radiation Oncology

## 2019-07-05 DIAGNOSIS — C7951 Secondary malignant neoplasm of bone: Secondary | ICD-10-CM

## 2019-07-05 DIAGNOSIS — Z8659 Personal history of other mental and behavioral disorders: Secondary | ICD-10-CM

## 2019-07-05 LAB — CBC
HCT: 45.7 % (ref 39.0–52.0)
Hemoglobin: 15.1 g/dL (ref 13.0–17.0)
MCH: 31.7 pg (ref 26.0–34.0)
MCHC: 33 g/dL (ref 30.0–36.0)
MCV: 95.8 fL (ref 80.0–100.0)
Platelets: 293 10*3/uL (ref 150–400)
RBC: 4.77 MIL/uL (ref 4.22–5.81)
RDW: 13 % (ref 11.5–15.5)
WBC: 10.3 10*3/uL (ref 4.0–10.5)
nRBC: 0 % (ref 0.0–0.2)

## 2019-07-05 LAB — BASIC METABOLIC PANEL
Anion gap: 10 (ref 5–15)
BUN: 15 mg/dL (ref 8–23)
CO2: 29 mmol/L (ref 22–32)
Calcium: 8.8 mg/dL — ABNORMAL LOW (ref 8.9–10.3)
Chloride: 97 mmol/L — ABNORMAL LOW (ref 98–111)
Creatinine, Ser: 0.54 mg/dL — ABNORMAL LOW (ref 0.61–1.24)
GFR calc Af Amer: >60 mL/min (ref >60)
GFR calc non Af Amer: >60 mL/min (ref >60)
Glucose, Bld: 136 mg/dL — ABNORMAL HIGH (ref 70–99)
Potassium: 4.6 mmol/L (ref 3.5–5.1)
Sodium: 136 mmol/L (ref 135–145)

## 2019-07-05 LAB — CYTOLOGY - NON PAP

## 2019-07-05 MED ORDER — BICALUTAMIDE 50 MG PO TABS: 50.0000 mg | ORAL_TABLET | Freq: Every day | ORAL | 0 refills | Status: DC

## 2019-07-05 NOTE — Progress Notes (Signed)
Daily Progress Note   Patient Name: Keith Olson.       Date: 07/05/2019 DOB: 17-Dec-1940  Age: 79 y.o. MRN#: [id number] Attending Physician: Mendel Corning, MD Primary Care Physician: Dettinger, Fransisca Kaufmann, MD Admit Date: 06/27/2019  Reason for Consultation/Follow-up: Pain control  Subjective: I saw and examined Keith Olson this AM.    He reports feeling much improved today following change in pain regimen and resolution of his constipation.  Currently states he has no pain and feels that transition to oral medication has been beneficial as it lasts longer for him that IV medication did.  We discussed plan to continue current pain regimen and continue bowel regiemen.  Still await return of biopsy.  At this point, he would like to follow-up to discuss options for treatment with oncology.  Length of Stay: 8  Current Medications: Scheduled Meds:   acetaminophen  650 mg Oral Q6H   aspirin EC  81 mg Oral Daily   bicalutamide  50 mg Oral Daily   Chlorhexidine Gluconate Cloth  6 each Topical Daily   dexamethasone (DECADRON) injection  4 mg Intravenous Q12H   docusate sodium  100 mg Oral BID   fentaNYL  1 patch Transdermal Q72H   gabapentin  400 mg Oral TID   heparin  5,000 Units Subcutaneous Q8H   levothyroxine  75 mcg Oral QAC breakfast   lidocaine  1 patch Transdermal Q24H   pantoprazole  40 mg Oral Daily   polyethylene glycol  17 g Oral Daily   rosuvastatin  40 mg Oral Daily   senna  2 tablet Oral BID   tamsulosin  0.4 mg Oral QHS    Continuous Infusions:  lactated ringers Stopped (07/02/19 1800)    PRN Meds: ALPRAZolam, gadobutrol, HYDROmorphone (DILAUDID) injection, HYDROmorphone  Physical Exam   Vitals reviewed.  Constitutional:      General: He is  not in acute distress. HENT:     Head: Normocephalic and atraumatic.  Pulmonary:     Effort: Pulmonary effort is normal.  Musculoskeletal:     Right lower leg: No edema.     Left lower leg: No edema.  Skin:    General: Skin is warm and dry.  Neurological:     Mental Status: He is alert and oriented to person, place, and time.  Vital Signs: BP 139/79 (BP Location: Left Arm)    Pulse 71    Temp (!) 97.1 F (36.2 C)    Resp 19    Ht 5\' 8"  (1.727 m)    Wt 65.8 kg    SpO2 97%    BMI 22.05 kg/m  SpO2: SpO2: 97 % O2 Device: O2 Device: Room Air O2 Flow Rate: O2 Flow Rate (L/min): 2 L/min  Intake/output summary:   Intake/Output Summary (Last 24 hours) at 07/05/2019 1236 Last data filed at 07/04/2019 1909 Gross per 24 hour  Intake 480 ml  Output 1000 ml  Net -520 ml   LBM: Last BM Date: 07/04/19 Baseline Weight: Weight: 65.8 kg Most recent weight: Weight: 65.8 kg  Palliative Care Assessment & Plan   Patient Profile: 79 y.o. male  with past medical history of ischemic cardiomyopathy and prostate cancer presented on 06/27/2019 with lower back and pelvic pain for 6 months that had progressively worsened over the last 2 weeks. CT of chest showed new mass concerning for bronchogenic carcinoma with lymph node involvement. MRI spine showed extensive metastatic osseous lesions with T3 fracture but no cord compression, as well as L4 fracture with severe spinal stenosis and crowding of cauda equina. He underwent bronchoscopy with transbronchial biopsy to determine if metastasis is from recurrent prostate CA versus new lung CA is the primary source.   Recommendations/Plan:  Pain: Better controlled today per patient report.  Recommend continue fentanyl 53mcg/hr, breakthrough hydromorphone 4mg  PO every 3 hours as needed, and steroids while undergoing radiation.  Hope is that continued radiation will decrease his pain overall and he will be able to wean off some of his current opioid need.  With  this is mind, would favor keeping lower dose of long acting agent and allowing more frequent breakthrough dosing rather than quickly uptitrating his fentnayl patch.  Would then consider increase of his fentanyl patch if he is still requiring frequent breakthrough oxycodone a few weeks after completion of radiation.   Constipation: Resolved.  Recommend continue senna 2 tabs twice daily with addition of miralax as needed if no BM every 48 hours.  Goals of Care and Additional Recommendations:  Limitations on Scope of Treatment: Full Scope Treatment  Code Status:    Code Status Orders  (From admission, onward)         Start     Ordered   06/27/19 2124  Full code  Continuous     06/27/19 2123        Code Status History    Date Active Date Inactive Code Status Order ID Comments User Context   05/03/2019 2247 05/05/2019 0101 Full Code [id number]  Princella Pellegrini, MD Inpatient   09/05/2017 2025 09/07/2017 1354 Full Code [id number]  Reubin Milan, MD ED   09/02/2016 1229 09/02/2016 1844 Full Code [id number]  Leonie Man, MD Inpatient   12/12/2015 1305 12/12/2015 2158 Full Code [id number]  Martin[place], Peter M, MD Inpatient   08/28/2014 1050 08/29/2014 1534 Full Code [id number]  Rana Snare, MD Inpatient   Advance Care Planning Activity       Prognosis:   Unable to determine  Discharge Planning:  Home with [place] was discussed with patient  Thank you for allowing the Palliative Medicine Team to assist in the care of this patient.   Time In: 0900 Time Out: 0930 Total Time 30 Prolonged Time Billed No      Greater than 50%  of this time was  spent counseling and coordinating care related to the above assessment and plan.  Micheline Rough, MD  Please contact Palliative Medicine Team phone at [phone] for questions and concerns.

## 2019-07-05 NOTE — Progress Notes (Signed)
Physical Therapy Treatment Patient Details Name: Keith Olson. MRN: [id number] DOB: 07-26-1940 Today's Date: 07/05/2019    History of Present Illness 79 year old retired [place] [employer] who served in [employer] with a history of ischemic cardiomyopathy,prostate cancer status post radiation therapy (2016) who presented on 06/27/2019 with ongoing lower back and pelvic area pain for 6 months that acutely worsened over the past 2 weeks.Work-up included CT of chest abdomen pelvis was negative for PE new mass concerning for bronchogenic carcinoma with lymph node involvement.  MRI cervical/thoracic/lumbar spine showed extensive metastatic osseous lesions with T3 fracture but no cord compression, as well as L4 fracture with severe spinal stenosis and crowding of cauda equina.  PSA 239. Patient  was evaluated by neurosurgery who recommends nonoperative treatment by radiation and oncology.    PT Comments    The patient is very pleased that his pain is under control and has increased sensation of the left leg. Patient stood at [facility] , performed multiple weight shifts and march in place and 3 side steps. Patient was not ready to attempt gait. Will attempt ambulation in AM. Patient reports that he may possibly go home tomorrow..  Follow Up Recommendations  Supervision/Assistance - 24 hour;Home health PT     Equipment Recommendations    RW   Recommendations for Other Services       Precautions / Restrictions Precautions Precautions: Fall    Mobility  Bed Mobility   Bed Mobility: Supine to Sit;Sit to Supine     Supine to sit: Supervision Sit to supine: Supervision      Transfers Overall transfer level: Needs assistance Equipment used: Rolling walker (2 wheeled) Transfers: Sit to/from Stand Sit to Stand: Min guard;Min assist         General transfer comment: cues for safety  Ambulation/Gait Ambulation/Gait assistance: Min assist   Assistive device: Rolling walker (2  wheeled) Gait Pattern/deviations: Step-to pattern     General Gait Details: patient stood at bedside and marched in place x 30 times, practiced weight shift to right leg multiple times. performed 3 side steps along bed   Stairs             Wheelchair Mobility    Modified Rankin (Stroke Patients Only)       Balance Overall balance assessment: Needs assistance   Sitting balance-Leahy Scale: Good     Standing balance support: Bilateral upper extremity supported;During functional activity Standing balance-Leahy Scale: Fair Standing balance comment: reliant on UE's                            Cognition Arousal/Alertness: Awake/alert Behavior During Therapy: WFL for tasks assessed/performed                                          Exercises      General Comments        Pertinent Vitals/Pain Pain Assessment: No/denies pain    Home Living                      Prior Function            PT Goals (current goals can now be found in the care plan section) Progress towards PT goals: Progressing toward goals    Frequency    Min 3X/week      PT Plan Current plan  remains appropriate;Discharge plan needs to be updated    Co-evaluation              AM-PAC PT "6 Clicks" Mobility   Outcome Measure  Help needed turning from your back to your side while in a flat bed without using bedrails?: None Help needed moving from lying on your back to sitting on the side of a flat bed without using bedrails?: None Help needed moving to and from a bed to a chair (including a wheelchair)?: A Little Help needed standing up from a chair using your arms (e.g., wheelchair or bedside chair)?: A Little Help needed to walk in hospital room?: A Lot Help needed climbing 3-5 steps with a railing? : A Lot 6 Click Score: 18    End of Session Equipment Utilized During Treatment: Gait belt Activity Tolerance: Patient tolerated treatment  well Patient left: in bed;with call bell/phone within reach;with bed alarm set Nurse Communication: Mobility status PT Visit Diagnosis: Muscle weakness (generalized) (M62.81);Unsteadiness on feet (R26.81);Other symptoms and signs involving the nervous system (R29.898);Pain Pain - Right/Left: Right     Time: [id number] PT Time Calculation (min) (ACUTE ONLY): 15 min  Charges:  $Therapeutic Activity: 8-22 mins                     Tresa Endo PT Acute Rehabilitation Services Pager [phone] Office [phone]    Claretha Cooper 07/05/2019, 4:09 PM

## 2019-07-05 NOTE — Progress Notes (Signed)
[facility]                                                                              Patient Demographics  Keith Olson, is a 79 y.o. male, DOB - November 15, 1940, OEV:[id number]  Admit date - 06/27/2019   Admitting Physician Keith Patricia, MD  Outpatient Primary MD for the patient is Keith Olson, Keith Kaufmann, MD  Outpatient specialists:   LOS - 8  days   Medical records reviewed and are as summarized below:    Chief Complaint  Patient presents with  . Back Pain       Brief summary   79 year old retired [place] [employer] who served in [employer] with a history of ischemic cardiomyopathy,prostate cancer status post radiation therapy (2016) who presented on 4/28/2021with ongoing lower back and pelvic area pain for 6 months that acutely worsened over the past 2 weeks. He  was previously treated by his PCP with Vicodin with no improvement in pain and recommended to go to ED. Patient presented with worsening back pain, right lower extremity numbness and bilateral lower extremity weakness.  Work-up included CT of chest abdomen pelvis was negative for PE new mass concerning for bronchogenic carcinoma with lymph node involvement. MRI cervical/thoracic/lumbar spine showed extensive metastatic osseous lesions with T3 fracture but no cord compression, as well as L4 fracture with severe spinal stenosis and crowding of cauda equina.  PSA 239.Patient was transferred from [facility] on 4/28 where he was evaluated by neurosurgery who recommends nonoperative treatment by radiation and oncology, ?if metastasis from recurrent prostate versus new primary lung CA.  He was started on Decadron 4 mg twice daily along with bicalutamide 50 mg daily x 28 days with lupron shots to start at approximately 14 days into treatment (likely to be administered in outpatient setting). Radiation to L4 vertebrae started with plan for total 30 Gy in 10 fractions with first session 06/28/2019. He  underwent bronchoscopy with transbronchial biopsy 10/01/9935 to ascertain if prostate versus lung is the primary.   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 -Per neurosurgery, not a candidate for surgical intervention, given all levels of lumbar spine infiltrated with tumor, no urgent need for disc decompression with no red flag symptoms, started on XRT on 4/29 -Currently undergoing palliative XRT by radiation oncology -Continue IV Decadron -Oncology following, seen by Dr. Lorenso Olson, on Casodex 50 mg daily for 28 days, started on 06/29/2019 with Lupron shots to started approximately 14 days into treatment.  -Oncology, radiation oncology requested for disposition planning.  Patient wants to continue his care at any [place].  Scheduled for follow-up appointment on 07/11/2019 at 1:30 PM. -EBUS performed 5/3, results still pending, hoping results will be back today -Per radiation oncology, Dr. Tammi Olson, continue palliative XRT M-F, continue through next Wednesday.  Acute severe back pain -Continue fentanyl patch, Decadron, IV and oral Dilaudid  -Patient feels his pain is significantly better today  after adjusting the medications.  He is hoping to go home now, has a wife with Alzheimer's dementia at home. - will order the DME's  Constipation -Resolved, continue bowel regimen  Hypothyroidism Continue Synthroid  GERD Continue PPI  Hyperlipidemia Continue simvastatin  BPH Continue Flomax  CAD No acute anginal symptoms, continue aspirin  Code Status: Full code DVT Prophylaxis:  Lovenox  Family Communication: Discussed all imaging results, lab results, explained to the patient   Disposition Plan:     Status is: Inpatient  Remains inpatient  appropriate because:Ongoing active pain requiring inpatient pain management   Dispo: The patient is from: Home              Anticipated d/c is to: Home, patient feels he should be okay at home now the pain is getting better              Anticipated d/c date is: 1 day              Patient currently is not medically stable to d/c.  Continue pain control, disposition planning, likely DC home in a.m.  Bronchoscopy results still pending       Time Spent in minutes 35 minutes  Procedures:  Bronchoscopy/EBUS, biopsy on 5/3 XRT  Consultants:   Oncology Radiation oncology Pulmonology Palliative medicine  Antimicrobials:   Anti-infectives (From admission, onward)   None         Medications  Scheduled Meds: . acetaminophen  650 mg Oral Q6H  . aspirin EC  81 mg Oral Daily  . bicalutamide  50 mg Oral Daily  . Chlorhexidine Gluconate Cloth  6 each Topical Daily  . dexamethasone (DECADRON) injection  4 mg Intravenous Q12H  . docusate sodium  100 mg Oral BID  . fentaNYL  1 patch Transdermal Q72H  . gabapentin  400 mg Oral TID  . heparin  5,000 Units Subcutaneous Q8H  . levothyroxine  75 mcg Oral QAC breakfast  . lidocaine  1 patch Transdermal Q24H  . pantoprazole  40 mg Oral Daily  . polyethylene glycol  17 g Oral Daily  . rosuvastatin  40 mg Oral Daily  . senna  2 tablet Oral BID  . tamsulosin  0.4 mg Oral QHS   Continuous Infusions: . lactated ringers Stopped (07/02/19 1800)   PRN Meds:.ALPRAZolam, gadobutrol, HYDROmorphone (DILAUDID) injection, HYDROmorphone      Subjective:   Keith Olson was seen and examined today.  Patient reports that pain is significantly better today and feels more mobility and will be able to go home.  No acute issues overnight.  No acute neurological deficits.  No fevers or chills. Patient denies dizziness, chest pain, shortness of breath, abdominal pain, N/V.  No acute events overnight.  States had 2 BM yesterday Objective:   Vitals:     07/04/19 0530 07/04/19 1406 07/04/19 2014 07/05/19 0520  BP: 135/73 125/82 (!) 159/89 139/79  Pulse: 64 68 63 71  Resp: 18 16 18 19   Temp: 97.8 F (36.6 C) 97.7 F (36.5 C) 98.6 F (37 C) (!) 97.1 F (36.2 C)  TempSrc: Oral Oral    SpO2: 93% 95% 98% 97%  Weight:      Height:        Intake/Output Summary (Last 24 hours) at 07/05/2019 1151 Last data filed at 07/04/2019 1909 Gross per 24 hour  Intake 480 ml  Output 1000 ml  Net -520 ml     Wt Readings from Last 3 Encounters:  07/02/19 65.8 kg  05/04/19 67.4 kg  11/13/18 69 kg   Physical Exam  General: Alert and oriented x 3, NAD  Cardiovascular: S1 S2 clear, RRR. No pedal edema b/l  Respiratory: CTAB, no wheezing, rales or rhonchi  Gastrointestinal: Soft, nontender, nondistended, NBS  Ext: no pedal edema bilaterally  Neuro: no new deficits  Musculoskeletal: No cyanosis, clubbing  Skin: No rashes  Psych: Normal affect and demeanor, alert and oriented x3    Data Reviewed:  I have personally reviewed following labs and imaging studies  Micro Results Recent Results (from the past 240 hour(s))  Respiratory Panel by RT PCR (Flu A&B, Covid) - Nasopharyngeal Swab     Status: None   Collection Time: 06/27/19  8:23 PM   Specimen: Nasopharyngeal Swab  Result Value Ref Range Status   SARS Coronavirus 2 by RT PCR NEGATIVE NEGATIVE Final    Comment: (NOTE) SARS-CoV-2 target nucleic acids are NOT DETECTED. The SARS-CoV-2 RNA is generally detectable in upper respiratoy specimens during the acute phase of infection. The lowest concentration of SARS-CoV-2 viral copies this assay can detect is 131 copies/mL. A negative result does not preclude SARS-Cov-2 infection and should not be used as the sole basis for treatment or other patient management decisions. A negative result may occur with  improper specimen collection/handling, submission of specimen other than nasopharyngeal swab, presence of viral mutation(s) within  the areas targeted by this assay, and inadequate number of viral copies (<131 copies/mL). A negative result must be combined with clinical observations, patient history, and epidemiological information. The expected result is Negative. Fact Sheet for Patients:  PinkCheek.be Fact Sheet for Healthcare Providers:  GravelBags.it This test is not yet ap proved or cleared by the [place] FDA and  has been authorized for detection and/or diagnosis of SARS-CoV-2 by FDA under an Emergency Use Authorization (EUA). This EUA will remain  in effect (meaning this test can be used) for the duration of the COVID-19 declaration under Section 564(b)(1) of the Act, 21 U.S.C. section 360bbb-3(b)(1), unless the authorization is terminated or revoked sooner.    Influenza A by PCR NEGATIVE NEGATIVE Final   Influenza B by PCR NEGATIVE NEGATIVE Final    Comment: (NOTE) The Xpert Xpress SARS-CoV-2/FLU/RSV assay is intended as an aid in  the diagnosis of influenza from Nasopharyngeal swab specimens and  should not be used as a sole basis for treatment. Nasal washings and  aspirates are unacceptable for Xpert Xpress SARS-CoV-2/FLU/RSV  testing. Fact Sheet for Patients: PinkCheek.be Fact Sheet for Healthcare Providers: GravelBags.it This test is not yet approved or cleared by the [place] FDA and  has been authorized for detection and/or diagnosis of SARS-CoV-2 by  FDA under an Emergency Use Authorization (EUA). This EUA will remain  in effect (meaning this test can be used) for the duration of the  Covid-19 declaration under Section 564(b)(1) of the Act, 21  U.S.C. section 360bbb-3(b)(1), unless the authorization is  terminated or revoked. Performed at [facility], [address], [place], [place] [address]     Radiology Reports MR CERVICAL SPINE W WO CONTRAST  Result Date:  06/27/2019 CLINICAL DATA:  Back pain, history of prostate cancer, new suspected lung cancer EXAM: MRI CERVICAL, THORACIC AND LUMBAR SPINE WITHOUT AND WITH CONTRAST CONTRAST:  7 mL Gadavist TECHNIQUE: Multiplanar and multiecho pulse sequences of the cervical spine, to include the craniocervical junction and cervicothoracic junction, and thoracic and lumbar spine, were obtained without intravenous contrast. COMPARISON:  None. FINDINGS: MRI CERVICAL SPINE Motion artifact is  present Alignment: Anteroposterior alignment is maintained. Vertebrae: There is abnormal marrow signal at multiple levels with greatest involvement of C3 and C4 vertebral bodies. There no compression deformity. No significant epidural disease noted. Cord: No definite abnormal signal within the above limitation. Posterior Fossa, vertebral arteries, paraspinal tissues: Unremarkable. Disc levels: Multilevel disc bulges, endplate osteophytes, and facet and uncovertebral hypertrophy. There is no high-grade canal stenosis. Multilevel foraminal stenosis is present and poorly evaluated due to artifact. MRI THORACIC SPINE Motion artifact is present Alignment:  Anteroposterior alignment is maintained. Vertebrae: Multifocal abnormal marrow signal with greatest involvement of T2-T4 and T11 and T12. There is mild to moderate compression deformity of T3 with less than 50% loss of height. Probable mild ventral epidural extension at this level. There is some foraminal extension at T2-T3, T3-T4, and T4-T5 levels. Cord:  No abnormal signal within the above limitation. Paraspinal and other soft tissues: Intrathoracic findings are better evaluated on the prior chest CT. Disc levels: Mild degenerative changes are present without significant degenerative stenosis. MRI LUMBAR SPINE Motion artifact is present. Segmentation: Standard. Alignment:  Mild degenerative listhesis. Vertebrae: There is multifocal abnormal marrow signal throughout the lumbosacral spine and imaged  bony pelvis. There is mild compression deformity of L4 with less than 50% loss of height. Ventral epidural disease is present at this level with resulting severe canal stenosis and crowding of cauda equina. Conus medullaris and cauda equina: Conus extends to the L1 level. Paraspinal and other soft tissues: Unremarkable Disc levels: L1-L2:  Disc bulge.  No significant canal or foraminal stenosis. L2-L3: Disc bulge with endplate osteophytic ridging and facet arthropathy with ligamentum flavum infolding. Moderate canal stenosis. Mild foraminal stenosis. L3-L4: Disc bulge with endplate osteophytic ridging and marked facet arthropathy with ligamentum flavum infolding. Marked canal stenosis with effacement of the lateral recesses. Moderate foraminal stenosis. L4-L5: Disc bulge with endplate osteophytic ridging and moderate right and marked left facet arthropathy with ligamentum flavum infolding. Moderate to marked canal stenosis. Moderate to marked foraminal stenosis. L5-S1: Disc bulge with endplate osteophytic ridging and moderate facet arthropathy. No canal stenosis. Moderate to marked foraminal stenosis. IMPRESSION: Motion degraded study with suboptimal evaluation. Diffuse osseous metastatic disease. Mild to moderate compression deformity of T3 with mild ventral epidural extension. No cord compression. Mild compression deformity of L4 with ventral epidural extension resulting in severe canal stenosis and crowding of cauda equina. Multilevel degenerative changes, greatest at lumbar levels with significant canal stenosis at L3-L4 and L4-L5. These results were called by telephone at the time of interpretation on 06/27/2019 at 6:21 pm to provider Dr. Vanita Panda, who verbally acknowledged these results. Electronically Signed   By: Macy Mis M.D.   On: 06/27/2019 18:22   MR THORACIC SPINE W WO CONTRAST  Result Date: 06/27/2019 CLINICAL DATA:  Back pain, history of prostate cancer, new suspected lung cancer EXAM: MRI  CERVICAL, THORACIC AND LUMBAR SPINE WITHOUT AND WITH CONTRAST CONTRAST:  7 mL Gadavist TECHNIQUE: Multiplanar and multiecho pulse sequences of the cervical spine, to include the craniocervical junction and cervicothoracic junction, and thoracic and lumbar spine, were obtained without intravenous contrast. COMPARISON:  None. FINDINGS: MRI CERVICAL SPINE Motion artifact is present Alignment: Anteroposterior alignment is maintained. Vertebrae: There is abnormal marrow signal at multiple levels with greatest involvement of C3 and C4 vertebral bodies. There no compression deformity. No significant epidural disease noted. Cord: No definite abnormal signal within the above limitation. Posterior Fossa, vertebral arteries, paraspinal tissues: Unremarkable. Disc levels: Multilevel disc bulges, endplate osteophytes, and facet and  uncovertebral hypertrophy. There is no high-grade canal stenosis. Multilevel foraminal stenosis is present and poorly evaluated due to artifact. MRI THORACIC SPINE Motion artifact is present Alignment:  Anteroposterior alignment is maintained. Vertebrae: Multifocal abnormal marrow signal with greatest involvement of T2-T4 and T11 and T12. There is mild to moderate compression deformity of T3 with less than 50% loss of height. Probable mild ventral epidural extension at this level. There is some foraminal extension at T2-T3, T3-T4, and T4-T5 levels. Cord:  No abnormal signal within the above limitation. Paraspinal and other soft tissues: Intrathoracic findings are better evaluated on the prior chest CT. Disc levels: Mild degenerative changes are present without significant degenerative stenosis. MRI LUMBAR SPINE Motion artifact is present. Segmentation: Standard. Alignment:  Mild degenerative listhesis. Vertebrae: There is multifocal abnormal marrow signal throughout the lumbosacral spine and imaged bony pelvis. There is mild compression deformity of L4 with less than 50% loss of height. Ventral  epidural disease is present at this level with resulting severe canal stenosis and crowding of cauda equina. Conus medullaris and cauda equina: Conus extends to the L1 level. Paraspinal and other soft tissues: Unremarkable Disc levels: L1-L2:  Disc bulge.  No significant canal or foraminal stenosis. L2-L3: Disc bulge with endplate osteophytic ridging and facet arthropathy with ligamentum flavum infolding. Moderate canal stenosis. Mild foraminal stenosis. L3-L4: Disc bulge with endplate osteophytic ridging and marked facet arthropathy with ligamentum flavum infolding. Marked canal stenosis with effacement of the lateral recesses. Moderate foraminal stenosis. L4-L5: Disc bulge with endplate osteophytic ridging and moderate right and marked left facet arthropathy with ligamentum flavum infolding. Moderate to marked canal stenosis. Moderate to marked foraminal stenosis. L5-S1: Disc bulge with endplate osteophytic ridging and moderate facet arthropathy. No canal stenosis. Moderate to marked foraminal stenosis. IMPRESSION: Motion degraded study with suboptimal evaluation. Diffuse osseous metastatic disease. Mild to moderate compression deformity of T3 with mild ventral epidural extension. No cord compression. Mild compression deformity of L4 with ventral epidural extension resulting in severe canal stenosis and crowding of cauda equina. Multilevel degenerative changes, greatest at lumbar levels with significant canal stenosis at L3-L4 and L4-L5. These results were called by telephone at the time of interpretation on 06/27/2019 at 6:21 pm to provider Dr. Vanita Panda, who verbally acknowledged these results. Electronically Signed   By: Macy Mis M.D.   On: 06/27/2019 18:22   MR Lumbar Spine W Wo Contrast  Result Date: 06/27/2019 CLINICAL DATA:  Back pain, history of prostate cancer, new suspected lung cancer EXAM: MRI CERVICAL, THORACIC AND LUMBAR SPINE WITHOUT AND WITH CONTRAST CONTRAST:  7 mL Gadavist TECHNIQUE:  Multiplanar and multiecho pulse sequences of the cervical spine, to include the craniocervical junction and cervicothoracic junction, and thoracic and lumbar spine, were obtained without intravenous contrast. COMPARISON:  None. FINDINGS: MRI CERVICAL SPINE Motion artifact is present Alignment: Anteroposterior alignment is maintained. Vertebrae: There is abnormal marrow signal at multiple levels with greatest involvement of C3 and C4 vertebral bodies. There no compression deformity. No significant epidural disease noted. Cord: No definite abnormal signal within the above limitation. Posterior Fossa, vertebral arteries, paraspinal tissues: Unremarkable. Disc levels: Multilevel disc bulges, endplate osteophytes, and facet and uncovertebral hypertrophy. There is no high-grade canal stenosis. Multilevel foraminal stenosis is present and poorly evaluated due to artifact. MRI THORACIC SPINE Motion artifact is present Alignment:  Anteroposterior alignment is maintained. Vertebrae: Multifocal abnormal marrow signal with greatest involvement of T2-T4 and T11 and T12. There is mild to moderate compression deformity of T3 with less than 50%  loss of height. Probable mild ventral epidural extension at this level. There is some foraminal extension at T2-T3, T3-T4, and T4-T5 levels. Cord:  No abnormal signal within the above limitation. Paraspinal and other soft tissues: Intrathoracic findings are better evaluated on the prior chest CT. Disc levels: Mild degenerative changes are present without significant degenerative stenosis. MRI LUMBAR SPINE Motion artifact is present. Segmentation: Standard. Alignment:  Mild degenerative listhesis. Vertebrae: There is multifocal abnormal marrow signal throughout the lumbosacral spine and imaged bony pelvis. There is mild compression deformity of L4 with less than 50% loss of height. Ventral epidural disease is present at this level with resulting severe canal stenosis and crowding of cauda  equina. Conus medullaris and cauda equina: Conus extends to the L1 level. Paraspinal and other soft tissues: Unremarkable Disc levels: L1-L2:  Disc bulge.  No significant canal or foraminal stenosis. L2-L3: Disc bulge with endplate osteophytic ridging and facet arthropathy with ligamentum flavum infolding. Moderate canal stenosis. Mild foraminal stenosis. L3-L4: Disc bulge with endplate osteophytic ridging and marked facet arthropathy with ligamentum flavum infolding. Marked canal stenosis with effacement of the lateral recesses. Moderate foraminal stenosis. L4-L5: Disc bulge with endplate osteophytic ridging and moderate right and marked left facet arthropathy with ligamentum flavum infolding. Moderate to marked canal stenosis. Moderate to marked foraminal stenosis. L5-S1: Disc bulge with endplate osteophytic ridging and moderate facet arthropathy. No canal stenosis. Moderate to marked foraminal stenosis. IMPRESSION: Motion degraded study with suboptimal evaluation. Diffuse osseous metastatic disease. Mild to moderate compression deformity of T3 with mild ventral epidural extension. No cord compression. Mild compression deformity of L4 with ventral epidural extension resulting in severe canal stenosis and crowding of cauda equina. Multilevel degenerative changes, greatest at lumbar levels with significant canal stenosis at L3-L4 and L4-L5. These results were called by telephone at the time of interpretation on 06/27/2019 at 6:21 pm to provider Dr. Vanita Panda, who verbally acknowledged these results. Electronically Signed   By: Macy Mis M.D.   On: 06/27/2019 18:22   CT Angio Chest/Abd/Pel for Dissection W and/or Wo Contrast  Result Date: 06/27/2019 CLINICAL DATA:  79 year old male with history of severe lower abdominal and back pain. EXAM: CT ANGIOGRAPHY CHEST, ABDOMEN AND PELVIS TECHNIQUE: Non-contrast CT of the chest was initially obtained. Multidetector CT imaging through the chest, abdomen and pelvis was  performed using the standard protocol during bolus administration of intravenous contrast. Multiplanar reconstructed images and MIPs were obtained and reviewed to evaluate the vascular anatomy. CONTRAST:  116mL OMNIPAQUE IOHEXOL 350 MG/ML SOLN COMPARISON:  CT the abdomen and pelvis 04/20/2012. FINDINGS: CTA CHEST FINDINGS Cardiovascular: Precontrast images demonstrate no crescentic high attenuation associated with the wall of the thoracic aorta to suggest acute intramural hematoma. No aneurysm or dissection of the thoracic aorta. 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 Mid thoracic aortic arch measures 2.9 cm in diameter. Descending thoracic aorta measures 2.8 cm in diameter. Heart size is normal. There is no significant pericardial fluid, thickening or pericardial calcification. There is aortic atherosclerosis, as well as atherosclerosis of the great vessels of the mediastinum and the coronary arteries, including calcified atherosclerotic plaque in the left main, left anterior descending, left circumflex and right coronary arteries. Mediastinum/Nodes: Enlarged AP window lymph node measuring 1.7 cm in short axis. Borderline enlarged left hilar lymph nodes measuring up to 1.3 cm in short axis. Small hiatal hernia. No axillary lymphadenopathy. Lungs/Pleura: Multiple pulmonary nodules are noted, largest of which is in the left upper lobe (axial image 63 of  series 7 and sagittal image 141 of series 10) measuring 2.5 x 1.6 x 1.5 cm. This lesion has macrolobulated slightly ill-defined margins. Several other smaller pulmonary nodules are noted including a 7 x 4 mm left upper lobe nodule (axial image 65 of series 7), and a 1.0 x 0.5 cm nodule in the superior segment of the right lower lobe (axial image 44 of series 7). No acute consolidative airspace disease. No pleural effusions. Musculoskeletal: There are no aggressive appearing lytic or blastic lesions noted in the visualized portions of the skeleton.  Review of the MIP images confirms the above findings. CTA ABDOMEN AND PELVIS FINDINGS VASCULAR Aorta: Normal caliber aorta without aneurysm, dissection, vasculitis or significant stenosis. Extensive atherosclerotic disease throughout the abdominal aorta. Celiac: Patent without evidence of dissection, vasculitis or significant stenosis. Mild ectasia of the proximal celiac axis which measures 8 mm in diameter. SMA: Patent without evidence of aneurysm, dissection, vasculitis or significant stenosis. Renals: Both renal arteries are patent without evidence of aneurysm, dissection, vasculitis, fibromuscular dysplasia or significant stenosis. IMA: Patent without evidence of aneurysm, dissection, vasculitis or significant stenosis. Inflow: Patent without evidence of aneurysm, dissection, vasculitis or significant stenosis. Veins: No obvious venous abnormality within the limitations of this arterial phase study. Review of the MIP images confirms the above findings. NON-VASCULAR Hepatobiliary: 1.2 cm low-attenuation lesion in segment 2 of the liver, compatible with a simple cyst. No other larger more suspicious appearing pulmonary nodules or masses are noted. No intra or extrahepatic biliary ductal dilatation. Gallbladder is normal in appearance. Pancreas: No pancreatic mass. No pancreatic ductal dilatation. No pancreatic or peripancreatic fluid collections or inflammatory changes. Spleen: Unremarkable. Adrenals/Urinary Tract: Subcentimeter low-attenuation lesion in the posterior aspect of the interpolar region of the left kidney, too small to characterize, but statistically likely to represent a tiny cyst. Right kidney and bilateral adrenal glands are normal in appearance. No hydroureteronephrosis. Urinary bladder is normal in appearance. Stomach/Bowel: Normal appearance of the stomach. No pathologic dilatation of small bowel or colon. Numerous colonic diverticulae are noted, without surrounding inflammatory changes to  suggest an acute diverticulitis at this time. The appendix is not confidently identified and may be surgically absent. Regardless, there are no inflammatory changes noted adjacent to the cecum to suggest the presence of an acute appendicitis at this time. Lymphatic: No lymphadenopathy noted in the abdomen or pelvis. Reproductive: Fiducial markers in the prostate gland. Other: No significant volume of ascites.  No pneumoperitoneum. Musculoskeletal: There are no aggressive appearing lytic or blastic lesions noted in the visualized portions of the skeleton. Review of the MIP images confirms the above findings. IMPRESSION: 1. No acute findings are noted in the chest, abdomen or pelvis to account for the patient's history of abdominal pain. 2. Multiple pulmonary nodules in the lungs bilaterally, largest of which is a left upper lobe pulmonary nodule measuring 2.5 x 1.6 x 1.5 cm. This is associated with left hilar and AP window lymphadenopathy. 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 Further evaluation with PET-CT is recommended in the near future. No definite signs of metastatic disease in the abdomen or pelvis. 3. Colonic diverticulosis without evidence of acute diverticulitis at this time. 4. Aortic atherosclerosis, in addition to left main and 3 vessel coronary artery disease. Assessment for potential risk factor modification, dietary therapy or pharmacologic therapy may be warranted, if clinically indicated. 5. 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 Recommend annual  imaging followup by CTA or MRA. This recommendation follows 2010 ACCF/AHA/AATS/ACR/ASA/SCA/SCAI/SIR/STS/SVM Guidelines for the Diagnosis and Management of Patients with Thoracic Aortic Disease. Circulation. 2010; 121: A768-T157. Aortic aneurysm NOS (ICD10-I71.9). 6. Additional incidental findings, as above. Electronically Signed   By:  Vinnie Langton M.D.   On: 06/27/2019 12:26    Lab Data:  CBC: Recent Labs  Lab 06/29/19 0456 06/30/19 0549 07/04/19 0503 07/05/19 0525  WBC 13.2* 9.9 8.4 10.3  HGB 13.3 13.6 13.6 15.1  HCT 39.8 40.5 41.6 45.7  MCV 94.3 93.8 95.4 95.8  PLT 202 196 258 262   Basic Metabolic Panel: Recent Labs  Lab 06/29/19 0456 06/30/19 0549 07/04/19 0503 07/05/19 0525  NA 129* 130* 135 136  K 4.1 3.9 4.2 4.6  CL 96* 97* 102 97*  CO2 25 23 24 29   GLUCOSE 121* 153* 124* 136*  BUN 20 15 14 15   CREATININE 0.73 0.66 0.63 0.54*  CALCIUM 8.7* 8.7* 8.4* 8.8*   GFR: Estimated Creatinine Clearance: 72 mL/min (A) (by C-G formula based on SCr of 0.54 mg/dL (L)). Liver Function Tests: No results for input(s): AST, ALT, ALKPHOS, BILITOT, PROT, ALBUMIN in the last 168 hours. No results for input(s): LIPASE, AMYLASE in the last 168 hours. No results for input(s): AMMONIA in the last 168 hours. Coagulation Profile: No results for input(s): INR, PROTIME in the last 168 hours. Cardiac Enzymes: No results for input(s): CKTOTAL, CKMB, CKMBINDEX, TROPONINI in the last 168 hours. BNP (last 3 results) No results for input(s): PROBNP in the last 8760 hours. HbA1C: No results for input(s): HGBA1C in the last 72 hours. CBG: No results for input(s): GLUCAP in the last 168 hours. Lipid Profile: No results for input(s): CHOL, HDL, LDLCALC, TRIG, CHOLHDL, LDLDIRECT in the last 72 hours. Thyroid Function Tests: No results for input(s): TSH, T4TOTAL, FREET4, T3FREE, THYROIDAB in the last 72 hours. Anemia Panel: No results for input(s): VITAMINB12, FOLATE, FERRITIN, TIBC, IRON, RETICCTPCT in the last 72 hours. Urine analysis:    Component Value Date/Time   COLORURINE YELLOW 06/27/2019 1756   APPEARANCEUR CLEAR 06/27/2019 1756   APPEARANCEUR Clear 05/22/2018 1547   LABSPEC 1.043 (H) 06/27/2019 1756   PHURINE 6.0 06/27/2019 1756   GLUCOSEU NEGATIVE 06/27/2019 1756   HGBUR NEGATIVE 06/27/2019 1756    BILIRUBINUR NEGATIVE 06/27/2019 1756   BILIRUBINUR Negative 05/22/2018 1547   KETONESUR 5 (A) 06/27/2019 1756   PROTEINUR NEGATIVE 06/27/2019 1756   UROBILINOGEN negative 03/07/2014 1059   NITRITE NEGATIVE 06/27/2019 1756   LEUKOCYTESUR NEGATIVE 06/27/2019 1756     Artur Winningham M.D. [facility] 07/05/2019, 11:51 AM   Call night coverage person covering after 7pm

## 2019-07-05 NOTE — Progress Notes (Signed)
HEMATOLOGY-ONCOLOGY PROGRESS NOTE  SUBJECTIVE: Keith Olson reports that his pain is significantly better controlled.  He was seen by palliative care who adjusted his pain medications yesterday.  He is no longer interested in going to CIR but wants to go home and work with home health physical therapy.  States that he is able to stand, but is not really walking much.  He remains on Casodex and is tolerating this well.  He denies any hot flashes.  He had a bronchoscopy performed on 07/02/2019 for biopsy of his lung mass.  Results of this biopsy are still pending.  The patient would like medical oncology follow-up at the cancer center in [place], [place].  REVIEW OF SYSTEMS:   Constitutional: Denies fevers, chills Eyes: Denies blurriness of vision Ears, nose, mouth, throat, and face: Denies mucositis or sore throat Respiratory: Denies cough, dyspnea or wheezes Cardiovascular: Denies palpitation, chest discomfort Gastrointestinal:  Denies nausea, heartburn or change in bowel habits Skin: Denies abnormal skin rashes Lymphatics: Denies new lymphadenopathy or easy bruising Neurological: Leg still weak, but no numbness Behavioral/Psych: Mood is stable, no new changes  Extremities: No lower extremity edema All other systems were reviewed with the patient and are negative.  I have reviewed the past medical history, past surgical history, social history and family history with the patient and they are unchanged from previous note.   PHYSICAL EXAMINATION: ECOG PERFORMANCE STATUS: 2 - Symptomatic, <50% confined to bed  Vitals:   07/04/19 2014 07/05/19 0520  BP: (!) 159/89 139/79  Pulse: 63 71  Resp: 18 19  Temp: 98.6 F (37 C) (!) 97.1 F (36.2 C)  SpO2: 98% 97%   Filed Weights   06/27/19 1028 07/02/19 1242  Weight: 65.8 kg 65.8 kg    Intake/Output from previous day: 05/05 0701 - 05/06 0700 In: 720 [P.O.:720] Out: 1000 [Urine:1000]  GENERAL:alert, no distress and  comfortable SKIN: skin color, texture, turgor are normal, no rashes or significant lesions EYES: normal, Conjunctiva are pink and non-injected, sclera clear OROPHARYNX:no exudate, no erythema and lips, buccal mucosa, and tongue normal  NECK: supple, thyroid normal size, non-tender, without nodularity LYMPH:  no palpable lymphadenopathy in the cervical, axillary or inguinal LUNGS: clear to auscultation and percussion with normal breathing effort HEART: regular rate & rhythm and no murmurs and no lower extremity edema ABDOMEN:abdomen soft, non-tender and normal bowel sounds Musculoskeletal:no cyanosis of digits and no clubbing  NEURO: alert & oriented x 3 with fluent speech, no focal motor/sensory deficits  LABORATORY DATA:  I have reviewed the data as listed CMP Latest Ref Rng & Units 07/05/2019 07/04/2019 06/30/2019  Glucose 70 - 99 mg/dL 136(H) 124(H) 153(H)  BUN 8 - 23 mg/dL 15 14 15   Creatinine 0.61 - 1.24 mg/dL 0.54(L) 0.63 0.66  Sodium 135 - 145 mmol/L 136 135 130(L)  Potassium 3.5 - 5.1 mmol/L 4.6 4.2 3.9  Chloride 98 - 111 mmol/L 97(L) 102 97(L)  CO2 22 - 32 mmol/L 29 24 23   Calcium 8.9 - 10.3 mg/dL 8.8(L) 8.4(L) 8.7(L)  Total Protein 6.5 - 8.1 g/dL - - -  Total Bilirubin 0.3 - 1.2 mg/dL - - -  Alkaline Phos 38 - 126 U/L - - -  AST 15 - 41 U/L - - -  ALT 0 - 44 U/L - - -    Lab Results  Component Value Date   WBC 10.3 07/05/2019   HGB 15.1 07/05/2019   HCT 45.7 07/05/2019   MCV 95.8 07/05/2019   PLT  293 07/05/2019   NEUTROABS 6.6 06/27/2019    MR CERVICAL SPINE W WO CONTRAST  Result Date: 06/27/2019 CLINICAL DATA:  Back pain, history of prostate cancer, new suspected lung cancer EXAM: MRI CERVICAL, THORACIC AND LUMBAR SPINE WITHOUT AND WITH CONTRAST CONTRAST:  7 mL Gadavist TECHNIQUE: Multiplanar and multiecho pulse sequences of the cervical spine, to include the craniocervical junction and cervicothoracic junction, and thoracic and lumbar spine, were obtained without  intravenous contrast. COMPARISON:  None. FINDINGS: MRI CERVICAL SPINE Motion artifact is present Alignment: Anteroposterior alignment is maintained. Vertebrae: There is abnormal marrow signal at multiple levels with greatest involvement of C3 and C4 vertebral bodies. There no compression deformity. No significant epidural disease noted. Cord: No definite abnormal signal within the above limitation. Posterior Fossa, vertebral arteries, paraspinal tissues: Unremarkable. Disc levels: Multilevel disc bulges, endplate osteophytes, and facet and uncovertebral hypertrophy. There is no high-grade canal stenosis. Multilevel foraminal stenosis is present and poorly evaluated due to artifact. MRI THORACIC SPINE Motion artifact is present Alignment:  Anteroposterior alignment is maintained. Vertebrae: Multifocal abnormal marrow signal with greatest involvement of T2-T4 and T11 and T12. There is mild to moderate compression deformity of T3 with less than 50% loss of height. Probable mild ventral epidural extension at this level. There is some foraminal extension at T2-T3, T3-T4, and T4-T5 levels. Cord:  No abnormal signal within the above limitation. Paraspinal and other soft tissues: Intrathoracic findings are better evaluated on the prior chest CT. Disc levels: Mild degenerative changes are present without significant degenerative stenosis. MRI LUMBAR SPINE Motion artifact is present. Segmentation: Standard. Alignment:  Mild degenerative listhesis. Vertebrae: There is multifocal abnormal marrow signal throughout the lumbosacral spine and imaged bony pelvis. There is mild compression deformity of L4 with less than 50% loss of height. Ventral epidural disease is present at this level with resulting severe canal stenosis and crowding of cauda equina. Conus medullaris and cauda equina: Conus extends to the L1 level. Paraspinal and other soft tissues: Unremarkable Disc levels: L1-L2:  Disc bulge.  No significant canal or foraminal  stenosis. L2-L3: Disc bulge with endplate osteophytic ridging and facet arthropathy with ligamentum flavum infolding. Moderate canal stenosis. Mild foraminal stenosis. L3-L4: Disc bulge with endplate osteophytic ridging and marked facet arthropathy with ligamentum flavum infolding. Marked canal stenosis with effacement of the lateral recesses. Moderate foraminal stenosis. L4-L5: Disc bulge with endplate osteophytic ridging and moderate right and marked left facet arthropathy with ligamentum flavum infolding. Moderate to marked canal stenosis. Moderate to marked foraminal stenosis. L5-S1: Disc bulge with endplate osteophytic ridging and moderate facet arthropathy. No canal stenosis. Moderate to marked foraminal stenosis. IMPRESSION: Motion degraded study with suboptimal evaluation. Diffuse osseous metastatic disease. Mild to moderate compression deformity of T3 with mild ventral epidural extension. No cord compression. Mild compression deformity of L4 with ventral epidural extension resulting in severe canal stenosis and crowding of cauda equina. Multilevel degenerative changes, greatest at lumbar levels with significant canal stenosis at L3-L4 and L4-L5. These results were called by telephone at the time of interpretation on 06/27/2019 at 6:21 pm to provider Dr. Vanita Panda, who verbally acknowledged these results. Electronically Signed   By: Macy Mis M.D.   On: 06/27/2019 18:22   MR THORACIC SPINE W WO CONTRAST  Result Date: 06/27/2019 CLINICAL DATA:  Back pain, history of prostate cancer, new suspected lung cancer EXAM: MRI CERVICAL, THORACIC AND LUMBAR SPINE WITHOUT AND WITH CONTRAST CONTRAST:  7 mL Gadavist TECHNIQUE: Multiplanar and multiecho pulse sequences of the cervical spine,  to include the craniocervical junction and cervicothoracic junction, and thoracic and lumbar spine, were obtained without intravenous contrast. COMPARISON:  None. FINDINGS: MRI CERVICAL SPINE Motion artifact is present  Alignment: Anteroposterior alignment is maintained. Vertebrae: There is abnormal marrow signal at multiple levels with greatest involvement of C3 and C4 vertebral bodies. There no compression deformity. No significant epidural disease noted. Cord: No definite abnormal signal within the above limitation. Posterior Fossa, vertebral arteries, paraspinal tissues: Unremarkable. Disc levels: Multilevel disc bulges, endplate osteophytes, and facet and uncovertebral hypertrophy. There is no high-grade canal stenosis. Multilevel foraminal stenosis is present and poorly evaluated due to artifact. MRI THORACIC SPINE Motion artifact is present Alignment:  Anteroposterior alignment is maintained. Vertebrae: Multifocal abnormal marrow signal with greatest involvement of T2-T4 and T11 and T12. There is mild to moderate compression deformity of T3 with less than 50% loss of height. Probable mild ventral epidural extension at this level. There is some foraminal extension at T2-T3, T3-T4, and T4-T5 levels. Cord:  No abnormal signal within the above limitation. Paraspinal and other soft tissues: Intrathoracic findings are better evaluated on the prior chest CT. Disc levels: Mild degenerative changes are present without significant degenerative stenosis. MRI LUMBAR SPINE Motion artifact is present. Segmentation: Standard. Alignment:  Mild degenerative listhesis. Vertebrae: There is multifocal abnormal marrow signal throughout the lumbosacral spine and imaged bony pelvis. There is mild compression deformity of L4 with less than 50% loss of height. Ventral epidural disease is present at this level with resulting severe canal stenosis and crowding of cauda equina. Conus medullaris and cauda equina: Conus extends to the L1 level. Paraspinal and other soft tissues: Unremarkable Disc levels: L1-L2:  Disc bulge.  No significant canal or foraminal stenosis. L2-L3: Disc bulge with endplate osteophytic ridging and facet arthropathy with  ligamentum flavum infolding. Moderate canal stenosis. Mild foraminal stenosis. L3-L4: Disc bulge with endplate osteophytic ridging and marked facet arthropathy with ligamentum flavum infolding. Marked canal stenosis with effacement of the lateral recesses. Moderate foraminal stenosis. L4-L5: Disc bulge with endplate osteophytic ridging and moderate right and marked left facet arthropathy with ligamentum flavum infolding. Moderate to marked canal stenosis. Moderate to marked foraminal stenosis. L5-S1: Disc bulge with endplate osteophytic ridging and moderate facet arthropathy. No canal stenosis. Moderate to marked foraminal stenosis. IMPRESSION: Motion degraded study with suboptimal evaluation. Diffuse osseous metastatic disease. Mild to moderate compression deformity of T3 with mild ventral epidural extension. No cord compression. Mild compression deformity of L4 with ventral epidural extension resulting in severe canal stenosis and crowding of cauda equina. Multilevel degenerative changes, greatest at lumbar levels with significant canal stenosis at L3-L4 and L4-L5. These results were called by telephone at the time of interpretation on 06/27/2019 at 6:21 pm to provider Dr. Vanita Panda, who verbally acknowledged these results. Electronically Signed   By: Macy Mis M.D.   On: 06/27/2019 18:22   MR Lumbar Spine W Wo Contrast  Result Date: 06/27/2019 CLINICAL DATA:  Back pain, history of prostate cancer, new suspected lung cancer EXAM: MRI CERVICAL, THORACIC AND LUMBAR SPINE WITHOUT AND WITH CONTRAST CONTRAST:  7 mL Gadavist TECHNIQUE: Multiplanar and multiecho pulse sequences of the cervical spine, to include the craniocervical junction and cervicothoracic junction, and thoracic and lumbar spine, were obtained without intravenous contrast. COMPARISON:  None. FINDINGS: MRI CERVICAL SPINE Motion artifact is present Alignment: Anteroposterior alignment is maintained. Vertebrae: There is abnormal marrow signal at  multiple levels with greatest involvement of C3 and C4 vertebral bodies. There no compression deformity. No significant  epidural disease noted. Cord: No definite abnormal signal within the above limitation. Posterior Fossa, vertebral arteries, paraspinal tissues: Unremarkable. Disc levels: Multilevel disc bulges, endplate osteophytes, and facet and uncovertebral hypertrophy. There is no high-grade canal stenosis. Multilevel foraminal stenosis is present and poorly evaluated due to artifact. MRI THORACIC SPINE Motion artifact is present Alignment:  Anteroposterior alignment is maintained. Vertebrae: Multifocal abnormal marrow signal with greatest involvement of T2-T4 and T11 and T12. There is mild to moderate compression deformity of T3 with less than 50% loss of height. Probable mild ventral epidural extension at this level. There is some foraminal extension at T2-T3, T3-T4, and T4-T5 levels. Cord:  No abnormal signal within the above limitation. Paraspinal and other soft tissues: Intrathoracic findings are better evaluated on the prior chest CT. Disc levels: Mild degenerative changes are present without significant degenerative stenosis. MRI LUMBAR SPINE Motion artifact is present. Segmentation: Standard. Alignment:  Mild degenerative listhesis. Vertebrae: There is multifocal abnormal marrow signal throughout the lumbosacral spine and imaged bony pelvis. There is mild compression deformity of L4 with less than 50% loss of height. Ventral epidural disease is present at this level with resulting severe canal stenosis and crowding of cauda equina. Conus medullaris and cauda equina: Conus extends to the L1 level. Paraspinal and other soft tissues: Unremarkable Disc levels: L1-L2:  Disc bulge.  No significant canal or foraminal stenosis. L2-L3: Disc bulge with endplate osteophytic ridging and facet arthropathy with ligamentum flavum infolding. Moderate canal stenosis. Mild foraminal stenosis. L3-L4: Disc bulge with  endplate osteophytic ridging and marked facet arthropathy with ligamentum flavum infolding. Marked canal stenosis with effacement of the lateral recesses. Moderate foraminal stenosis. L4-L5: Disc bulge with endplate osteophytic ridging and moderate right and marked left facet arthropathy with ligamentum flavum infolding. Moderate to marked canal stenosis. Moderate to marked foraminal stenosis. L5-S1: Disc bulge with endplate osteophytic ridging and moderate facet arthropathy. No canal stenosis. Moderate to marked foraminal stenosis. IMPRESSION: Motion degraded study with suboptimal evaluation. Diffuse osseous metastatic disease. Mild to moderate compression deformity of T3 with mild ventral epidural extension. No cord compression. Mild compression deformity of L4 with ventral epidural extension resulting in severe canal stenosis and crowding of cauda equina. Multilevel degenerative changes, greatest at lumbar levels with significant canal stenosis at L3-L4 and L4-L5. These results were called by telephone at the time of interpretation on 06/27/2019 at 6:21 pm to provider Dr. Vanita Panda, who verbally acknowledged these results. Electronically Signed   By: Macy Mis M.D.   On: 06/27/2019 18:22   CT Angio Chest/Abd/Pel for Dissection W and/or Wo Contrast  Result Date: 06/27/2019 CLINICAL DATA:  79 year old male with history of severe lower abdominal and back pain. EXAM: CT ANGIOGRAPHY CHEST, ABDOMEN AND PELVIS TECHNIQUE: Non-contrast CT of the chest was initially obtained. Multidetector CT imaging through the chest, abdomen and pelvis was performed using the standard protocol during bolus administration of intravenous contrast. Multiplanar reconstructed images and MIPs were obtained and reviewed to evaluate the vascular anatomy. CONTRAST:  17mL OMNIPAQUE IOHEXOL 350 MG/ML SOLN COMPARISON:  CT the abdomen and pelvis 04/20/2012. FINDINGS: CTA CHEST FINDINGS Cardiovascular: Precontrast images demonstrate no  crescentic high attenuation associated with the wall of the thoracic aorta to suggest acute intramural hematoma. No aneurysm or dissection of the thoracic aorta. 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 Mid thoracic aortic arch measures 2.9 cm in diameter. Descending thoracic aorta measures 2.8 cm in diameter. Heart size is normal. There is no significant pericardial fluid, thickening or pericardial  calcification. There is aortic atherosclerosis, as well as atherosclerosis of the great vessels of the mediastinum and the coronary arteries, including calcified atherosclerotic plaque in the left main, left anterior descending, left circumflex and right coronary arteries. Mediastinum/Nodes: Enlarged AP window lymph node measuring 1.7 cm in short axis. Borderline enlarged left hilar lymph nodes measuring up to 1.3 cm in short axis. Small hiatal hernia. No axillary lymphadenopathy. Lungs/Pleura: Multiple pulmonary nodules are noted, largest of which is in the left upper lobe (axial image 63 of series 7 and sagittal image 141 of series 10) measuring 2.5 x 1.6 x 1.5 cm. This lesion has macrolobulated slightly ill-defined margins. Several other smaller pulmonary nodules are noted including a 7 x 4 mm left upper lobe nodule (axial image 65 of series 7), and a 1.0 x 0.5 cm nodule in the superior segment of the right lower lobe (axial image 44 of series 7). No acute consolidative airspace disease. No pleural effusions. Musculoskeletal: There are no aggressive appearing lytic or blastic lesions noted in the visualized portions of the skeleton. Review of the MIP images confirms the above findings. CTA ABDOMEN AND PELVIS FINDINGS VASCULAR Aorta: Normal caliber aorta without aneurysm, dissection, vasculitis or significant stenosis. Extensive atherosclerotic disease throughout the abdominal aorta. Celiac: Patent without evidence of dissection, vasculitis or significant stenosis. Mild ectasia of the proximal celiac  axis which measures 8 mm in diameter. SMA: Patent without evidence of aneurysm, dissection, vasculitis or significant stenosis. Renals: Both renal arteries are patent without evidence of aneurysm, dissection, vasculitis, fibromuscular dysplasia or significant stenosis. IMA: Patent without evidence of aneurysm, dissection, vasculitis or significant stenosis. Inflow: Patent without evidence of aneurysm, dissection, vasculitis or significant stenosis. Veins: No obvious venous abnormality within the limitations of this arterial phase study. Review of the MIP images confirms the above findings. NON-VASCULAR Hepatobiliary: 1.2 cm low-attenuation lesion in segment 2 of the liver, compatible with a simple cyst. No other larger more suspicious appearing pulmonary nodules or masses are noted. No intra or extrahepatic biliary ductal dilatation. Gallbladder is normal in appearance. Pancreas: No pancreatic mass. No pancreatic ductal dilatation. No pancreatic or peripancreatic fluid collections or inflammatory changes. Spleen: Unremarkable. Adrenals/Urinary Tract: Subcentimeter low-attenuation lesion in the posterior aspect of the interpolar region of the left kidney, too small to characterize, but statistically likely to represent a tiny cyst. Right kidney and bilateral adrenal glands are normal in appearance. No hydroureteronephrosis. Urinary bladder is normal in appearance. Stomach/Bowel: Normal appearance of the stomach. No pathologic dilatation of small bowel or colon. Numerous colonic diverticulae are noted, without surrounding inflammatory changes to suggest an acute diverticulitis at this time. The appendix is not confidently identified and may be surgically absent. Regardless, there are no inflammatory changes noted adjacent to the cecum to suggest the presence of an acute appendicitis at this time. Lymphatic: No lymphadenopathy noted in the abdomen or pelvis. Reproductive: Fiducial markers in the prostate gland. Other:  No significant volume of ascites.  No pneumoperitoneum. Musculoskeletal: There are no aggressive appearing lytic or blastic lesions noted in the visualized portions of the skeleton. Review of the MIP images confirms the above findings. IMPRESSION: 1. No acute findings are noted in the chest, abdomen or pelvis to account for the patient's history of abdominal pain. 2. Multiple pulmonary nodules in the lungs bilaterally, largest of which is a left upper lobe pulmonary nodule measuring 2.5 x 1.6 x 1.5 cm. This is associated with left hilar and AP window lymphadenopathy. Findings are highly concerning for primary bronchogenic carcinoma  with metastatic disease to the lymph nodes and lungs in the thorax. Further evaluation with PET-CT is recommended in the near future. No definite signs of metastatic disease in the abdomen or pelvis. 3. Colonic diverticulosis without evidence of acute diverticulitis at this time. 4. Aortic atherosclerosis, in addition to left main and 3 vessel coronary artery disease. Assessment for potential risk factor modification, dietary therapy or pharmacologic therapy may be warranted, if clinically indicated. 5. 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 Recommend annual imaging followup by CTA or MRA. This recommendation follows 2010 ACCF/AHA/AATS/ACR/ASA/SCA/SCAI/SIR/STS/SVM Guidelines for the Diagnosis and Management of Patients with Thoracic Aortic Disease. Circulation. 2010; 121: J883-G549. Aortic aneurysm NOS (ICD10-I71.9). 6. Additional incidental findings, as above. Electronically Signed   By: Vinnie Langton M.D.   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 On 12/21/2017 his PSA was noted to be 0.2. On 06/27/2019 he presented to [facility] ED with intractable back pain which had been present x 3 months and worsened over the last month. MRI of the spine was performed which showed diffuse osseus metastatic disease with several canal stensis and crowding of the cauda equina. Due to the need for Neurosurgery and radiation oncology evaluation he was transferred to [facility] for further evaluation and management. Initial labs showed a PSA 239. A CT C/A/P revealed numerous pulmonary nodules with the largest being a 2.5 x 1.6 x 1.5 cm lesion in the left upper lobe.  The patient was started on Casodex 50 mg daily on 06/29/2019.  We are planning to continue this for at least 28 days.  Recommend starting Lupron injections 14 days into Casodex treatment (to be done as an outpatient).  He continues on radiation to his spine mets.  He is scheduled to complete this on 07/11/2019.  He requests medical oncology follow-up closer to home in [place], [place].  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 At this time I favor metastatic prostate as his sole malignancy.  --Status post bronchoscopy with EBUS on 07/02/2019.  Pathology results are still pending. --The patient has been started on androgen deprivation therapy with bicalutamide 50 mg PO daily.  This was started on 06/29/2019.  After 14 days of treatment can start Lupron therapy. Upfront bicalutamide serves to prevent painful flare of bone lesions with [place] agonists.  --patient notes he would want his cancer care administered at [facility] in the outpatient setting.  I have requested a new patient appointment at the [facility].  He is currently scheduled for 07/11/2019 at 1:30 PM. --He will continue  radiation to the compressing spinal lesion. --Discussed with hospitalist who is anticipating hospital discharge in the next 24 hours if he remains stable.  I have sent a prescription for Casodex 50 mg daily to his local pharmacy per hospitalist request.    LOS: 8 days   Mikey Bussing, DNP, AGPCNP-BC, AOCNP 07/05/19

## 2019-07-05 NOTE — Progress Notes (Deleted)
After radiation treatment in am

## 2019-07-06 ENCOUNTER — Encounter (HOSPITAL_COMMUNITY): Payer: Self-pay | Admitting: Internal Medicine

## 2019-07-06 ENCOUNTER — Ambulatory Visit
Admit: 2019-07-06 | Discharge: 2019-07-06 | Disposition: A | Discharge status: Home / Self Care | Payer: Medicare Other | Attending: Radiation Oncology | Admitting: Radiation Oncology

## 2019-07-06 ENCOUNTER — Inpatient Hospital Stay (HOSPITAL_COMMUNITY): Payer: Medicare Other

## 2019-07-06 LAB — BASIC METABOLIC PANEL
Anion gap: 10 (ref 5–15)
BUN: 18 mg/dL (ref 8–23)
CO2: 26 mmol/L (ref 22–32)
Calcium: 8.5 mg/dL — ABNORMAL LOW (ref 8.9–10.3)
Chloride: 96 mmol/L — ABNORMAL LOW (ref 98–111)
Creatinine, Ser: 0.58 mg/dL — ABNORMAL LOW (ref 0.61–1.24)
GFR calc Af Amer: >60 mL/min (ref >60)
GFR calc non Af Amer: >60 mL/min (ref >60)
Glucose, Bld: 131 mg/dL — ABNORMAL HIGH (ref 70–99)
Potassium: 4.8 mmol/L (ref 3.5–5.1)
Sodium: 132 mmol/L — ABNORMAL LOW (ref 135–145)

## 2019-07-06 LAB — CBC
HCT: 42.8 % (ref 39.0–52.0)
Hemoglobin: 13.9 g/dL (ref 13.0–17.0)
MCH: 31 pg (ref 26.0–34.0)
MCHC: 32.5 g/dL (ref 30.0–36.0)
MCV: 95.5 fL (ref 80.0–100.0)
Platelets: 287 10*3/uL (ref 150–400)
RBC: 4.48 MIL/uL (ref 4.22–5.81)
RDW: 12.9 % (ref 11.5–15.5)
WBC: 16.8 10*3/uL — ABNORMAL HIGH (ref 4.0–10.5)
nRBC: 0 % (ref 0.0–0.2)

## 2019-07-06 IMAGING — CT CT HEAD W/O CM
3 series · 15 of 45 positions shown, 18 images · non-contrast
Comparison: None.

CLINICAL DATA: Vertigo

EXAM:
CT HEAD WITHOUT CONTRAST
TECHNIQUE: Contiguous axial images were obtained from the base of the skull
through the vertex without intravenous contrast.

[Series 2: head wo · axial · 0.47mm/px · z∈[+1391,+1506]mm · 9 of 28 slices shown, 12 images]
[im 3/28  brain]
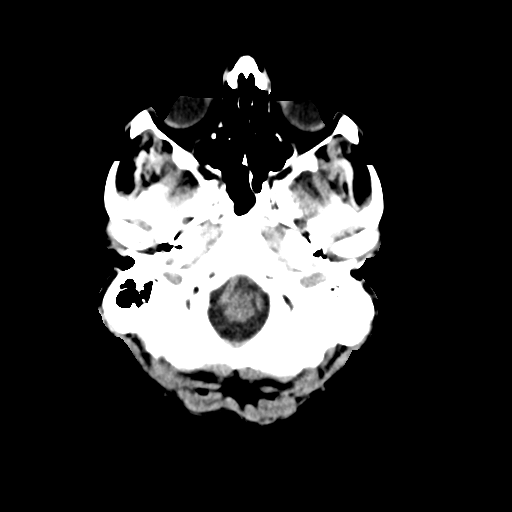
[im 3/28  bone]
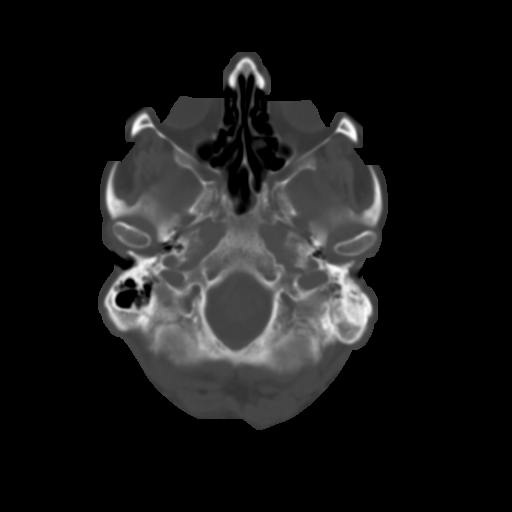
[im 6/28  brain]
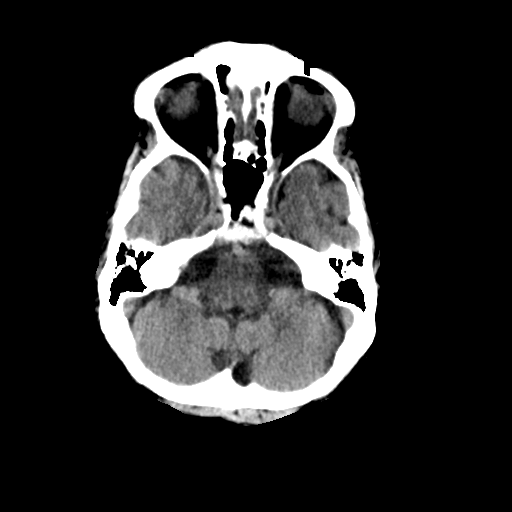
[im 9/28  brain]
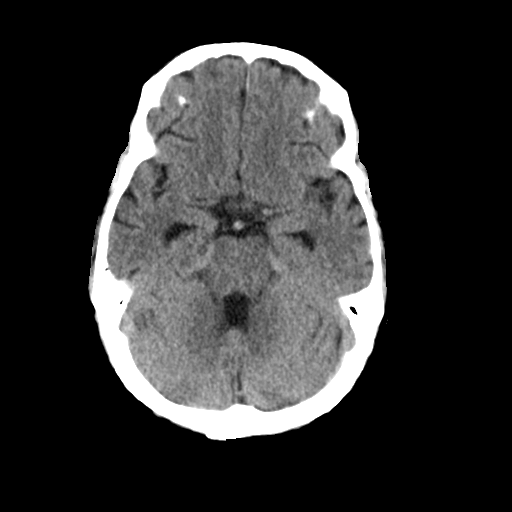
[im 12/28  brain]
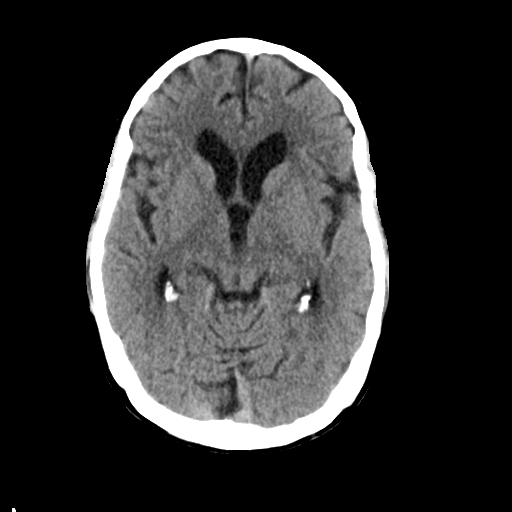
[im 15/28  brain]
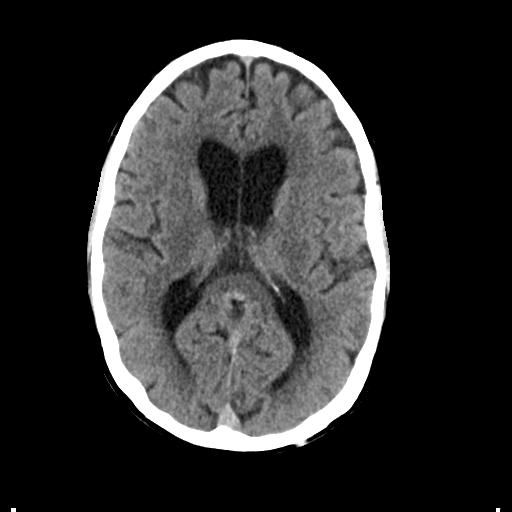
[im 15/28  bone]
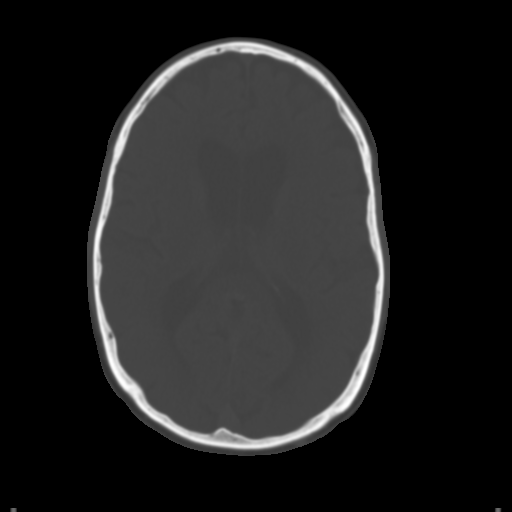
[im 17/28  brain]
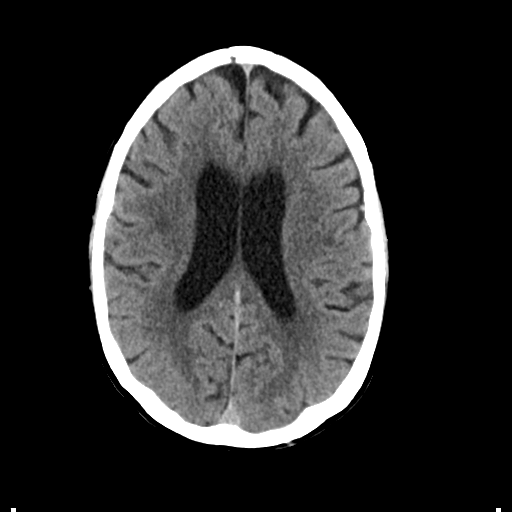
[im 20/28  brain]
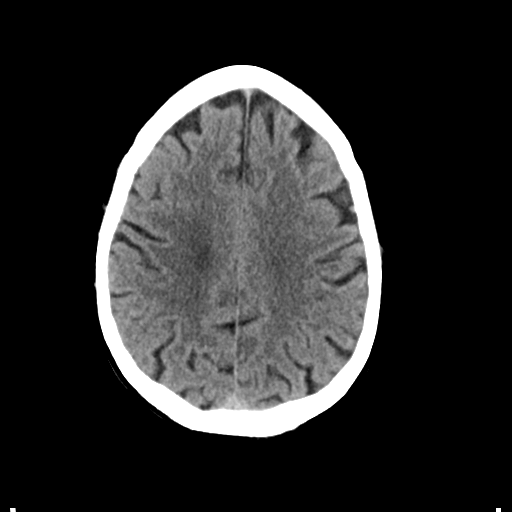
[im 23/28  brain]
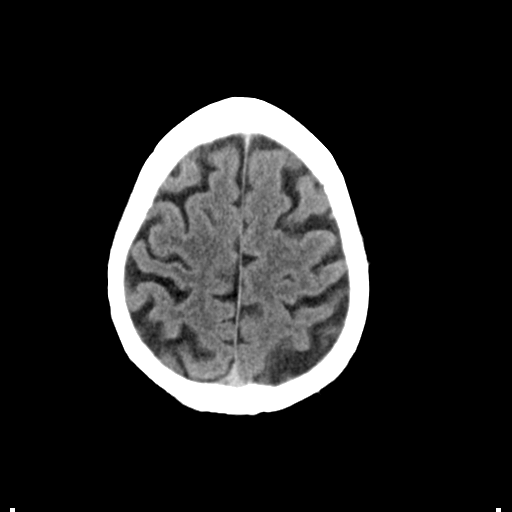
[im 26/28  brain]
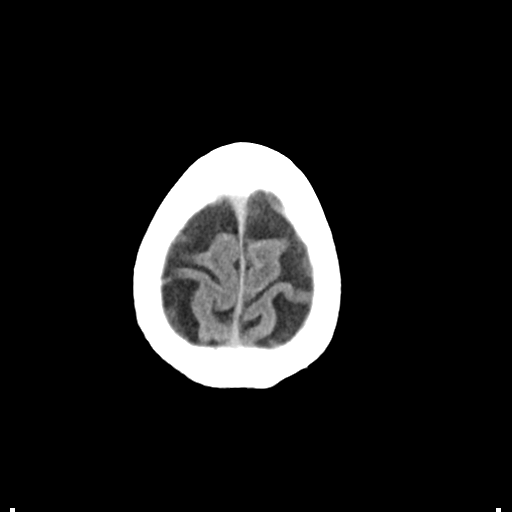
[im 26/28  bone]
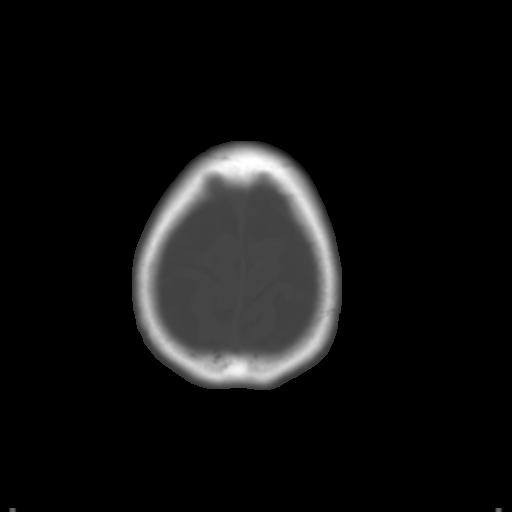

[Series 4: coronal soft tissue · coronal · 0.28mm/px · 3 of 69 slices shown]
[im 23/69  brain]
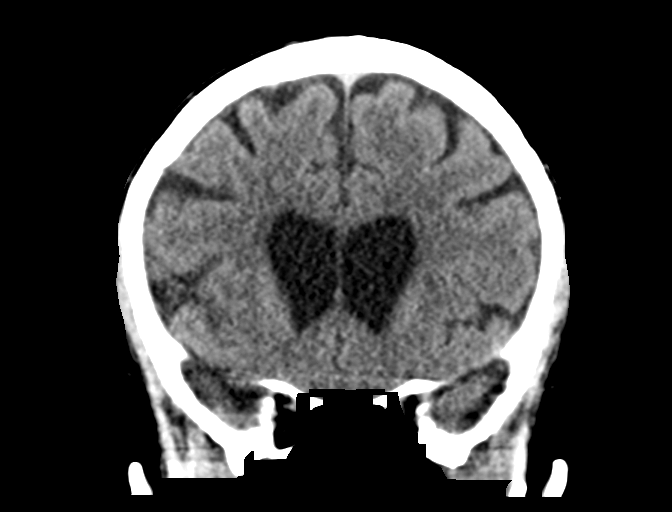
[im 31/69  brain]
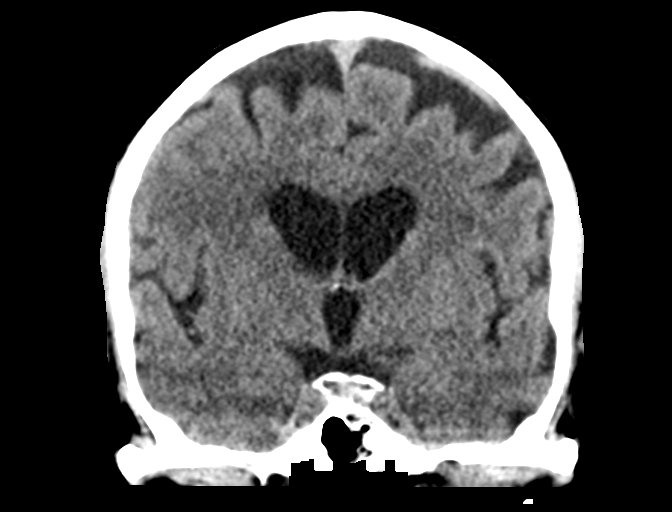
[im 38/69  brain]
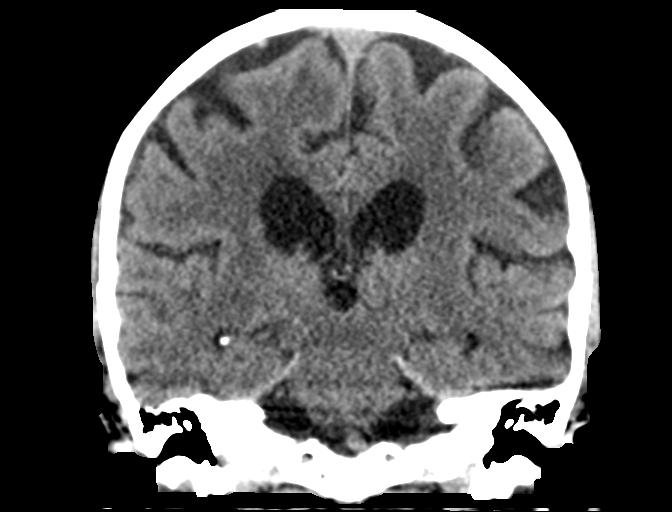

[Series 5: sagittal soft tissue · sagittal · 0.30mm/px · 3 of 55 slices shown]
[im 19/55  brain]
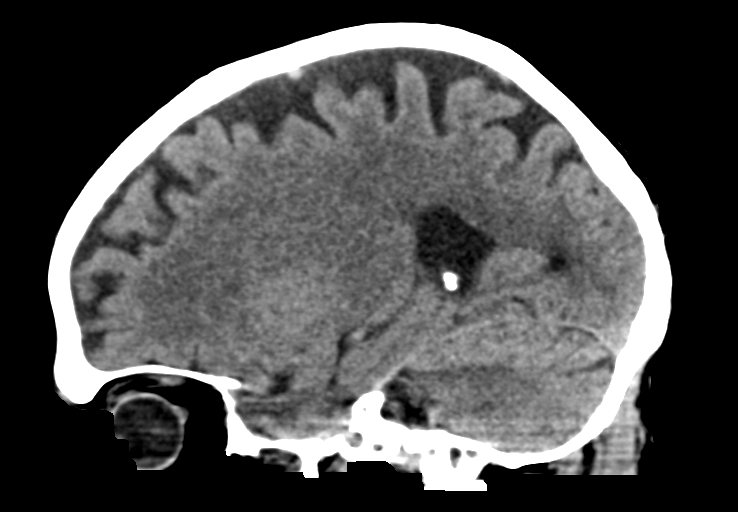
[im 28/55  brain]
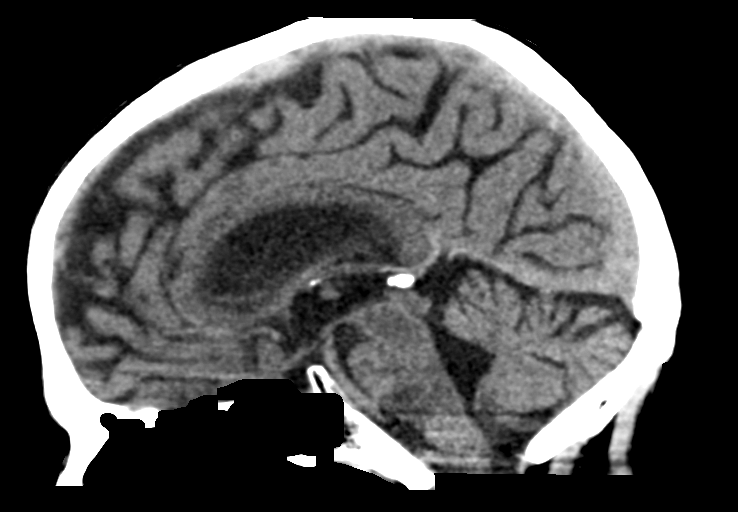
[im 37/55  brain]
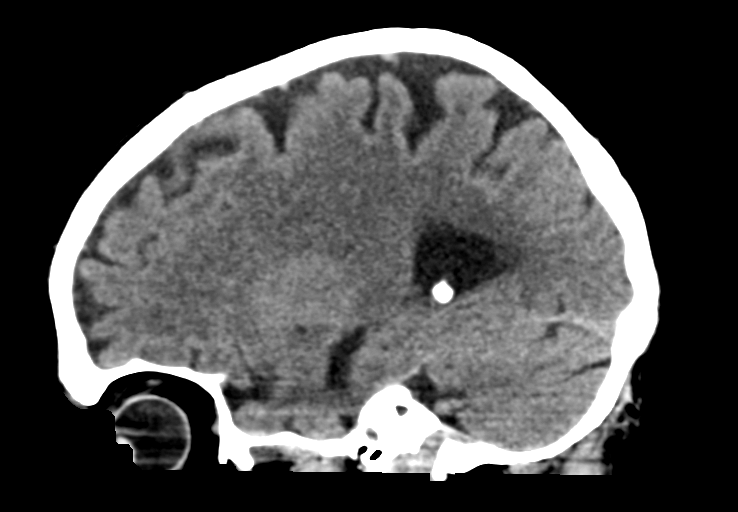

[15 of 45 positions shown; findings below may reference images not displayed]

FINDINGS: Brain: No evidence of acute infarction, hemorrhage, hydrocephalus,
extra-axial collection or mass lesion/mass effect.

Vascular: No hyperdense vessel or unexpected calcification.

Skull: No osseous abnormality.

Sinuses/Orbits: Visualized paranasal sinuses are clear. Visualized
mastoid sinuses are clear. Visualized orbits demonstrate no focal
abnormality.

Other: None
IMPRESSION: No acute intracranial pathology.

## 2019-07-06 MED ORDER — PANTOPRAZOLE SODIUM 40 MG PO TBEC: 40.0000 mg | DELAYED_RELEASE_TABLET | Freq: Every day | ORAL | 3 refills | Status: DC

## 2019-07-06 MED ORDER — POLYETHYLENE GLYCOL 3350 17 G PO PACK: 17.0000 g | PACK | Freq: Every day | ORAL | 3 refills | Status: DC

## 2019-07-06 MED ORDER — HYDROMORPHONE HCL 4 MG PO TABS: 4.0000 mg | ORAL_TABLET | ORAL | 0 refills | Status: DC | PRN

## 2019-07-06 MED ORDER — FENTANYL 25 MCG/HR TD PT72: 1.0000 | MEDICATED_PATCH | TRANSDERMAL | 0 refills | Status: DC

## 2019-07-06 MED ORDER — GABAPENTIN 400 MG PO CAPS: 400.0000 mg | ORAL_CAPSULE | Freq: Three times a day (TID) | ORAL | 3 refills | Status: DC

## 2019-07-06 MED ORDER — DEXAMETHASONE 4 MG PO TABS
4.0000 mg | ORAL_TABLET | Freq: Two times a day (BID) | ORAL | 1 refills | Status: DC
Start: 2019-07-06 — End: 2020-04-05

## 2019-07-06 MED ORDER — SODIUM CHLORIDE 0.9 % IV BOLUS
500.0000 mL | Freq: Once | INTRAVENOUS | Status: AC
  Administered 2019-07-06: 500 mL via INTRAVENOUS

## 2019-07-06 MED ORDER — LIDOCAINE 5 % EX PTCH: 1.0000 | MEDICATED_PATCH | CUTANEOUS | 0 refills | Status: DC

## 2019-07-06 MED ORDER — SENNOSIDES-DOCUSATE SODIUM 8.6-50 MG PO TABS
2.0000 | ORAL_TABLET | Freq: Two times a day (BID) | ORAL | 3 refills | Status: DC
Start: 2019-07-06 — End: 2019-07-20

## 2019-07-06 MED ORDER — MECLIZINE HCL 25 MG PO TABS
25.0000 mg | ORAL_TABLET | Freq: Once | ORAL | Status: AC
  Administered 2019-07-06: 25 mg via ORAL
  Filled 2019-07-06: qty 1

## 2019-07-06 MED ORDER — MECLIZINE HCL 25 MG PO TABS: 25.0000 mg | ORAL_TABLET | Freq: Two times a day (BID) | ORAL | 0 refills | Status: DC | PRN

## 2019-07-06 NOTE — Progress Notes (Signed)
Pt c/o dizziness. MD notified. Orders received.

## 2019-07-06 NOTE — Progress Notes (Signed)
Inpatient Rehab Admissions Coordinator:   Spoke to pt over the phone to confirm.  Plan is for him to go home with home health.  I will withdraw insurance request for CIR and sign off.    Shann Medal, PT, DPT Admissions Coordinator [phone] 07/06/19  10:27 AM

## 2019-07-06 NOTE — Progress Notes (Signed)
[facility] Therapy Treatment Record Phone [phone]   Radiation Therapy was administered to Keith Olson. on: 07/06/2019  1:05 PM and was treatment # 7 out of a planned course of 10 treatments.  Radiation Treatment  1). Beam photons with 6-10 energy  2). Brachytherapy None  3). Stereotactic Radiosurgery None  4). Other Radiation None     Keith Olson F Cameshia Cressman, RT (T)

## 2019-07-06 NOTE — Plan of Care (Signed)
Path results all inconclusive. Will discuss next steps with Oncology today. Making him NPO for now due to concern that he may need another biopsy.  Julian Hy, DO 07/06/19 7:34 AM [facility]

## 2019-07-06 NOTE — Progress Notes (Signed)
    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  A cane, walker will not resolve issue with performing activities of daily living. A wheelchair will allow patient to safely perform daily activities. Patient can safely propel the wheelchair in the home or has a caregiver who can provide assistance. Length of need 12 months Accessories: elevating leg rests (ELRs), wheel locks, extensions and anti-tippers.   07/05/19 1158   07/05/19 1157  For home use only DME wheelchair cushion (seat and back)  Once     07/05/19 1158   07/05/19 1156  For home use only DME Walker rolling  Once    Question Answer Comment  Walker: With [place]   Patient needs a walker to treat with the following condition Gait instability      07/05/19 1158

## 2019-07-06 NOTE — Progress Notes (Signed)
Brief oncology note:  I stopped by to visit [name] this morning.  I notified him of the EBUS results and that it showed atypical cells, but was nondiagnostic.  I also discussed with PCCM and hospitalist.  From our standpoint, okay to discharge patient home if otherwise medically stable.  He has outpatient follow-up at the [facility] on 07/11/2019.  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  He will continue radiation under the care of Dr. Tammi Klippel.  I have ordered a regular diet for him and discontinued his n.p.o. status.  Mikey Bussing, DNP, AGPCNP-BC, AOCNP Mon/Tues/Thurs/Fri 7am-5pm; Off Wednesdays Cell: [phone]

## 2019-07-06 NOTE — Progress Notes (Signed)
Pt discharged home with son and spouse in stable condition. Discharge instructions, including Foley care provided to patient and son. Pt verbalized understanding. No immediate questions or concerns at this time. Pt discharged from unit via wheelchair.

## 2019-07-06 NOTE — Care Management Important Message (Signed)
Important Message  Patient Details IM Letter given to Marney Doctor RN Case Manager to present to the Patient Name: Keith Olson. MRN: [id number] Date of Birth: 1940/07/18   Medicare Important Message Given:  Yes     Kerin Salen 07/06/2019, 10:40 AM

## 2019-07-06 NOTE — Progress Notes (Signed)
Physical Therapy Treatment Patient Details Name: Keith Olson. 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  MRI cervical/thoracic/lumbar spine showed extensive metastatic osseous lesions with T3 fracture but no cord compression, as well as L4 fracture with severe spinal stenosis and crowding of cauda equina.  PSA 239. Patient  was evaluated by neurosurgery who recommends nonoperative treatment by radiation and oncology.    PT Comments    The patient did ambulate x 30' in room using RW, cues for safety. Required min assist at times. Instructed patient on safety with RW to take care at doorframes and corners. Instructed patient and son on safety on steps: to lead up with left and down with right. Patient declined the WC. Patient plans Dc today.   Follow Up Recommendations  Supervision/Assistance - 24 hour;Home health PT     Equipment Recommendations  Rolling walker with 5" wheels    Recommendations for Other Services       Precautions / Restrictions Precautions Precautions: Fall Precaution Comments: needs premeds, sensory deficits rt leg,    Mobility  Bed Mobility   Bed Mobility: Supine to Sit;Sit to Supine     Supine to sit: Supervision Sit to supine: Supervision      Transfers   Equipment used: Rolling walker (2 wheeled) Transfers: Sit to/from Stand Sit to Stand: Min guard;Min assist         General transfer comment: patient slightly impulsive and requires cues for asafety, moves  quickly  Ambulation/Gait Ambulation/Gait assistance: Min assist Gait Distance (Feet): 12 Feet Assistive device: Rolling walker (2 wheeled) Gait Pattern/deviations: Step-to pattern;Antalgic Gait velocity: decr   General Gait Details: ambulated a short distance, noted decreased control of right leg at times, tendency to lean to right, some ataxia right leg   Stairs             Wheelchair Mobility    Modified Rankin (Stroke Patients Only)       Balance   Sitting-balance support: No upper extremity supported;Feet supported Sitting balance-Leahy Scale: Good     Standing balance support: Bilateral upper extremity supported;During functional activity Standing balance-Leahy Scale: Fair Standing balance comment: reliant on UE's                            Cognition Arousal/Alertness: Awake/alert                                     General Comments: requires redirection at times for safety with RW and lines and objuects      Exercises      General Comments        Pertinent Vitals/Pain Pain Assessment: Faces Faces Pain Scale: Hurts even more Pain Location: right back and pelvis Pain Descriptors / Indicators: Discomfort;Cramping;Numbness;Shooting Pain Intervention(s): Monitored during session;Premedicated before session    Home Living                      Prior Function  PT Goals (current goals can now be found in the care plan section) Progress towards PT goals: Progressing toward goals    Frequency    Min 3X/week      PT Plan Current plan remains appropriate    Co-evaluation              AM-PAC PT "6 Clicks" Mobility   Outcome Measure  Help needed turning from your back to your side while in a flat bed without using bedrails?: None Help needed moving from lying on your back to sitting on the side of a flat bed without using bedrails?: None Help needed moving to and from a bed to a chair  (including a wheelchair)?: A Little Help needed standing up from a chair using your arms (e.g., wheelchair or bedside chair)?: A Little Help needed to walk in hospital room?: A Lot Help needed climbing 3-5 steps with a railing? : A Lot 6 Click Score: 18    End of Session Equipment Utilized During Treatment: Gait belt Activity Tolerance: Patient tolerated treatment well Patient left: in bed;with call bell/phone within reach;with bed alarm set Nurse Communication: Mobility status PT Visit Diagnosis: Muscle weakness (generalized) (M62.81);Unsteadiness on feet (R26.81);Other symptoms and signs involving the nervous system (R29.898);Pain Pain - Right/Left: Right Pain - part of body: Leg     Time: 7209-4709 PT Time Calculation (min) (ACUTE ONLY): 24 min  Charges:  $Gait Training: 8-22 mins $Self Care/Home Management: [place] Pager [phone] Office [phone]    Claretha Cooper 07/06/2019, 4:04 PM

## 2019-07-06 NOTE — Discharge Summary (Signed)
Physician Discharge Summary   Patient ID: Keith Olson. MRN: [id number] DOB/AGE: [birth year] 77 y.o.  Admit date: 06/27/2019 Discharge date: 07/06/2019  Primary Care Physician:  Dettinger, Fransisca Kaufmann, MD   Recommendations for Outpatient Follow-up:  1. Follow up with PCP in 1-2 weeks 2. EBUS results were inconclusive, will need further work-up to be arranged outpatient by oncology 3. Patient declined CIR, home health was arranged 4. 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 Anxiety state . CAD (coronary artery disease) . Hyperlipidemia . Hypothyroidism . Malignant neoplasm of prostate [facility]) Urinary retention   Consults:   Oncology Radiation oncology Pulmonology Palliative medicine   Allergies:   Allergies  Allergen Reactions  . Lipitor [Atorvastatin]     Muscles aches     DISCHARGE MEDICATIONS: Allergies as of 07/06/2019      Reactions   Lipitor [atorvastatin]    Muscles aches      Medication List    STOP taking these medications   celecoxib 100 MG capsule Commonly known as: CELEBREX   HYDROcodone-acetaminophen 10-325 MG tablet Commonly known as: NORCO     TAKE these medications   ALPRAZolam 0.5 MG tablet Commonly known as: XANAX Take 0.5 mg by mouth 2 (two) times daily as needed.   aspirin EC 81 MG tablet Take 1 tablet (81 mg total) by mouth daily.   bicalutamide 50 MG tablet Commonly known as: CASODEX Take 1 tablet (50 mg total) by mouth daily.   dexamethasone 4  MG tablet Commonly known as: DECADRON Take 1 tablet (4 mg total) by mouth 2 (two) times daily with a meal.   diclofenac sodium 1 % Gel Commonly known as: VOLTAREN APPLY 2 GRAMS TOPICALLY 4 TIMES DAILY What changed: See the new instructions.   fentaNYL 25 MCG/HR Commonly known as: [place] 1 patch onto the skin every 3 (three) days.   gabapentin 400 MG capsule Commonly known as: NEURONTIN Take 1 capsule (400 mg total) by mouth 3 (three) times daily.   HYDROmorphone 4 MG tablet Commonly known as: DILAUDID Take 1 tablet (4 mg total) by mouth every 4 (four) hours as needed for moderate pain or severe pain.   levothyroxine 75 MCG tablet Commonly known as: SYNTHROID Take 1 tablet (75 mcg total) by mouth daily before breakfast.   lidocaine 5 % Commonly known as: LIDODERM Place 1 patch onto the skin daily. Remove & Discard patch within 12 hours or as directed by MD   meclizine 25 MG tablet Commonly known as: ANTIVERT Take 1 tablet (25 mg total) by mouth 2 (two) times daily as needed for dizziness.   nitroGLYCERIN 0.4 MG SL tablet Commonly known as: NITROSTAT Place 1 tablet (0.4 mg total) under the tongue every 5 (five) minutes as needed for chest pain. X 3 doses What changed: additional instructions   pantoprazole 40 MG tablet Commonly known as: PROTONIX Take 1 tablet (40 mg total) by mouth daily.   polyethylene glycol 17 g packet Commonly known as: MIRALAX / GLYCOLAX Take 17 g by mouth daily.  rosuvastatin 40 MG tablet Commonly known as: CRESTOR Take 1 tablet (40 mg total) by mouth daily.   senna-docusate 8.6-50 MG tablet Commonly known as: Senokot S Take 2 tablets by mouth 2 (two) times daily.   SUMAtriptan 25 MG tablet Commonly known as: IMITREX TAKE 1 TABLET BY MOUTH EVERY 2 HOURS AS NEEDED FOR MIGRAINE. MAY REPEAT IN 2 HOURS IF HEADACHE PERSISTS OR RECURS What changed:   how much to take  how to take this  when to take this  reasons to take this    tamsulosin 0.4 MG Caps capsule Commonly known as: FLOMAX Take 0.4 mg by mouth at bedtime.            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  A cane, walker will not resolve issue with performing activities of daily living. A wheelchair will allow patient to safely perform daily activities. Patient can safely propel the wheelchair in the home or has a caregiver who can provide assistance. Length of need 12 months Accessories: elevating leg rests (ELRs), wheel locks, extensions and anti-tippers.   07/05/19 1158   07/05/19 1157  For home use only DME wheelchair cushion (seat and back)  Once     07/05/19 1158   07/05/19 1156  For home use only DME Walker rolling  Once    Question Answer Comment  Walker: With [place]   Patient needs a walker to treat with the following condition Gait instability      07/05/19 1158           Brief H and P: For complete details please refer to admission H and P, but in brief 79 year old retired [place] [employer] who served in [employer] with a history of ischemic cardiomyopathy,prostate cancer status post radiation therapy (2016) who presented on 4/28/2021with ongoing lower back and pelvic area pain for 6 months that acutely worsened over the past 2 weeks. He  was previously treated by his PCP with Vicodin with no improvement in pain and recommended to go to ED. Patient presented with worsening back pain, right lower extremity numbness and bilateral lower extremity weakness. Work-up included CT of chest abdomen pelvis was negative for PE new mass concerning for bronchogenic carcinoma with lymph node involvement. MRI  cervical/thoracic/lumbar spine showed extensive metastatic osseous lesions with T3 fracture but no cord compression, as well as L4 fracture with severe spinal stenosis and crowding of cauda equina. PSA 239.Patient was transferred from [facility] to [facility] on 4/28 where he was evaluated by neurosurgery who recommends nonoperative treatment by radiation and oncology, ?if metastasis from recurrent prostate versus new primary lung CA.  He was started on Decadron 4 mg twice daily along with bicalutamide 50 mg daily x 28 days with lupron shots to start at approximately 14 days into treatment (likely to be administered in outpatient setting). Radiation to L4 vertebrae started with plan for total 30 Gy in 10 fractions with first session 06/28/2019. He underwent bronchoscopy with transbronchial biopsy 03/06/1094 to ascertain if prostate versus lung is the primary.   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 -Per neurosurgery, not a candidate for surgical intervention, given all levels of lumbar spine infiltrated with tumor, no urgent need for disc decompression with no red flag symptoms, started on radiation therapy on 4/29 -Currently undergoing palliative XRT by radiation oncology -Patient was placed on IV Decadron, transition to oral Decadron 4 mg twice daily, oncology to taper it outpatient -Oncology following, seen by Dr. Lorenso Courier, on Casodex 50 mg daily for 28 days, started on 06/29/2019 with Lupron shots to started approximately 14 days into treatment.  - Patient wants to continue his care at any [place].  Scheduled for follow-up appointment on 07/11/2019 at 1:30 PM. -EBUS performed 5/3,  results atypical cells, inconclusive, will likely need further work-up to be  arranged outpatient by oncology -Per radiation oncology, Dr. Tammi Klippel, continue palliative XRT M-F, continue through next Wednesday. -Patient complained of dizziness today, positive orthostatics, was given IV fluids and 1 dose of meclizine.  CT head was negative for any meta stasis  Acute severe back pain -Continue fentanyl patch, Decadron, IV and oral Dilaudid  -Patient feels his pain is significantly better today after adjusting the medications.  He is hoping to go home now, has a wife with Alzheimer's dementia at home. -Home health was arranged with rolling walker, wheelchair  Constipation -Resolved, continue bowel regimen  Hypothyroidism Continue Synthroid  GERD Continue PPI  Hyperlipidemia Continue simvastatin  BPH Continue Flomax Outpatient follow-up with urology scheduled   CAD No acute anginal symptoms, continue aspirin    Day of Discharge S: Pain much better controlled now, had BM this morning.  Hoping to go home after radiation treatment  BP 126/78 (BP Location: Left Arm)   Pulse 97   Temp 97.8 F (36.6 C) (Oral)   Resp 16   Ht 5\' 8"  (1.727 m)   Wt 65.8 kg   SpO2 99%   BMI 22.05 kg/m   Physical Exam: General: Alert and awake oriented x3 not in any acute distress. HEENT: anicteric sclera, pupils reactive to light and accommodation CVS: S1-S2 clear no murmur rubs or gallops Chest: clear to auscultation bilaterally, no wheezing rales or rhonchi Abdomen: soft nontender, nondistended, normal bowel sounds Extremities: no cyanosis, clubbing or edema noted bilaterally Neuro: No new FND's    Get Medicines reviewed and adjusted: Please take all your medications with you for your next visit with your Primary MD  Please request your Primary MD to go over all hospital tests and procedure/radiological results at the follow up. Please ask your Primary MD to get all Hospital records sent to his/her office.  If you experience worsening of your admission  symptoms, develop shortness of breath, life threatening emergency, suicidal or homicidal thoughts you must seek medical attention immediately by calling 911 or calling your MD immediately  if symptoms less severe.  You must read complete instructions/literature along with all the possible adverse reactions/side effects for all the Medicines you take and that have been prescribed to you. Take any new Medicines after you have completely understood and accept all the possible adverse reactions/side effects.   Do not drive when taking pain medications.   Do not take more than prescribed Pain, Sleep and Anxiety Medications  Special Instructions: If you have smoked or chewed Tobacco  in the last 2 yrs please stop smoking, stop any regular Alcohol  and or any Recreational  drug use.  Wear Seat belts while driving.  Please note  You were cared for by a hospitalist during your hospital stay. Once you are discharged, your primary care physician will handle any further medical issues. Please note that NO REFILLS for any discharge medications will be authorized once you are discharged, as it is imperative that you return to your primary care physician (or establish a relationship with a primary care physician if you do not have one) for your aftercare needs so that they can reassess your need for medications and monitor your lab values.   The results of significant diagnostics from this hospitalization (including imaging, microbiology, ancillary and laboratory) are listed below for reference.      Procedures/Studies:  CT HEAD WO CONTRAST  Result Date: 07/06/2019 CLINICAL DATA:  Vertigo EXAM: CT HEAD WITHOUT CONTRAST TECHNIQUE: Contiguous axial images were obtained from the base of the skull through the vertex without intravenous contrast. COMPARISON:  None. FINDINGS: Brain: No evidence of acute infarction, hemorrhage, hydrocephalus, extra-axial collection or mass lesion/mass effect. Vascular: No  hyperdense vessel or unexpected calcification. Skull: No osseous abnormality. Sinuses/Orbits: Visualized paranasal sinuses are clear. Visualized mastoid sinuses are clear. Visualized orbits demonstrate no focal abnormality. Other: None IMPRESSION: No acute intracranial pathology. Electronically Signed   By: Kathreen Devoid   On: 07/06/2019 13:39   MR CERVICAL SPINE W WO CONTRAST  Result Date: 06/27/2019 CLINICAL DATA:  Back pain, history of prostate cancer, new suspected lung cancer EXAM: MRI CERVICAL, THORACIC AND LUMBAR SPINE WITHOUT AND WITH CONTRAST CONTRAST:  7 mL Gadavist TECHNIQUE: Multiplanar and multiecho pulse sequences of the cervical spine, to include the craniocervical junction and cervicothoracic junction, and thoracic and lumbar spine, were obtained without intravenous contrast. COMPARISON:  None. FINDINGS: MRI CERVICAL SPINE Motion artifact is present Alignment: Anteroposterior alignment is maintained. Vertebrae: There is abnormal marrow signal at multiple levels with greatest involvement of C3 and C4 vertebral bodies. There no compression deformity. No significant epidural disease noted. Cord: No definite abnormal signal within the above limitation. Posterior Fossa, vertebral arteries, paraspinal tissues: Unremarkable. Disc levels: Multilevel disc bulges, endplate osteophytes, and facet and uncovertebral hypertrophy. There is no high-grade canal stenosis. Multilevel foraminal stenosis is present and poorly evaluated due to artifact. MRI THORACIC SPINE Motion artifact is present Alignment:  Anteroposterior alignment is maintained. Vertebrae: Multifocal abnormal marrow signal with greatest involvement of T2-T4 and T11 and T12. There is mild to moderate compression deformity of T3 with less than 50% loss of height. Probable mild ventral epidural extension at this level. There is some foraminal extension at T2-T3, T3-T4, and T4-T5 levels. Cord:  No abnormal signal within the above limitation.  Paraspinal and other soft tissues: Intrathoracic findings are better evaluated on the prior chest CT. Disc levels: Mild degenerative changes are present without significant degenerative stenosis. MRI LUMBAR SPINE Motion artifact is present. Segmentation: Standard. Alignment:  Mild degenerative listhesis. Vertebrae: There is multifocal abnormal marrow signal throughout the lumbosacral spine and imaged bony pelvis. There is mild compression deformity of L4 with less than 50% loss of height. Ventral epidural disease is present at this level with resulting severe canal stenosis and crowding of cauda equina. Conus medullaris and cauda equina: Conus extends to the L1 level. Paraspinal and other soft tissues: Unremarkable Disc levels: L1-L2:  Disc bulge.  No significant canal or foraminal stenosis. L2-L3: Disc bulge with endplate osteophytic ridging and facet arthropathy with ligamentum flavum infolding. Moderate canal stenosis. Mild foraminal stenosis. L3-L4: Disc bulge with endplate osteophytic  ridging and marked facet arthropathy with ligamentum flavum infolding. Marked canal stenosis with effacement of the lateral recesses. Moderate foraminal stenosis. L4-L5: Disc bulge with endplate osteophytic ridging and moderate right and marked left facet arthropathy with ligamentum flavum infolding. Moderate to marked canal stenosis. Moderate to marked foraminal stenosis. L5-S1: Disc bulge with endplate osteophytic ridging and moderate facet arthropathy. No canal stenosis. Moderate to marked foraminal stenosis. IMPRESSION: Motion degraded study with suboptimal evaluation. Diffuse osseous metastatic disease. Mild to moderate compression deformity of T3 with mild ventral epidural extension. No cord compression. Mild compression deformity of L4 with ventral epidural extension resulting in severe canal stenosis and crowding of cauda equina. Multilevel degenerative changes, greatest at lumbar levels with significant canal stenosis at  L3-L4 and L4-L5. These results were called by telephone at the time of interpretation on 06/27/2019 at 6:21 pm to provider Dr. Vanita Panda, who verbally acknowledged these results. Electronically Signed   By: Macy Mis M.D.   On: 06/27/2019 18:22   MR THORACIC SPINE W WO CONTRAST  Result Date: 06/27/2019 CLINICAL DATA:  Back pain, history of prostate cancer, new suspected lung cancer EXAM: MRI CERVICAL, THORACIC AND LUMBAR SPINE WITHOUT AND WITH CONTRAST CONTRAST:  7 mL Gadavist TECHNIQUE: Multiplanar and multiecho pulse sequences of the cervical spine, to include the craniocervical junction and cervicothoracic junction, and thoracic and lumbar spine, were obtained without intravenous contrast. COMPARISON:  None. FINDINGS: MRI CERVICAL SPINE Motion artifact is present Alignment: Anteroposterior alignment is maintained. Vertebrae: There is abnormal marrow signal at multiple levels with greatest involvement of C3 and C4 vertebral bodies. There no compression deformity. No significant epidural disease noted. Cord: No definite abnormal signal within the above limitation. Posterior Fossa, vertebral arteries, paraspinal tissues: Unremarkable. Disc levels: Multilevel disc bulges, endplate osteophytes, and facet and uncovertebral hypertrophy. There is no high-grade canal stenosis. Multilevel foraminal stenosis is present and poorly evaluated due to artifact. MRI THORACIC SPINE Motion artifact is present Alignment:  Anteroposterior alignment is maintained. Vertebrae: Multifocal abnormal marrow signal with greatest involvement of T2-T4 and T11 and T12. There is mild to moderate compression deformity of T3 with less than 50% loss of height. Probable mild ventral epidural extension at this level. There is some foraminal extension at T2-T3, T3-T4, and T4-T5 levels. Cord:  No abnormal signal within the above limitation. Paraspinal and other soft tissues: Intrathoracic findings are better evaluated on the prior chest CT. Disc  levels: Mild degenerative changes are present without significant degenerative stenosis. MRI LUMBAR SPINE Motion artifact is present. Segmentation: Standard. Alignment:  Mild degenerative listhesis. Vertebrae: There is multifocal abnormal marrow signal throughout the lumbosacral spine and imaged bony pelvis. There is mild compression deformity of L4 with less than 50% loss of height. Ventral epidural disease is present at this level with resulting severe canal stenosis and crowding of cauda equina. Conus medullaris and cauda equina: Conus extends to the L1 level. Paraspinal and other soft tissues: Unremarkable Disc levels: L1-L2:  Disc bulge.  No significant canal or foraminal stenosis. L2-L3: Disc bulge with endplate osteophytic ridging and facet arthropathy with ligamentum flavum infolding. Moderate canal stenosis. Mild foraminal stenosis. L3-L4: Disc bulge with endplate osteophytic ridging and marked facet arthropathy with ligamentum flavum infolding. Marked canal stenosis with effacement of the lateral recesses. Moderate foraminal stenosis. L4-L5: Disc bulge with endplate osteophytic ridging and moderate right and marked left facet arthropathy with ligamentum flavum infolding. Moderate to marked canal stenosis. Moderate to marked foraminal stenosis. L5-S1: Disc bulge with endplate osteophytic ridging and moderate  facet arthropathy. No canal stenosis. Moderate to marked foraminal stenosis. IMPRESSION: Motion degraded study with suboptimal evaluation. Diffuse osseous metastatic disease. Mild to moderate compression deformity of T3 with mild ventral epidural extension. No cord compression. Mild compression deformity of L4 with ventral epidural extension resulting in severe canal stenosis and crowding of cauda equina. Multilevel degenerative changes, greatest at lumbar levels with significant canal stenosis at L3-L4 and L4-L5. These results were called by telephone at the time of interpretation on 06/27/2019 at 6:21  pm to provider Dr. Vanita Panda, who verbally acknowledged these results. Electronically Signed   By: Macy Mis M.D.   On: 06/27/2019 18:22   MR Lumbar Spine W Wo Contrast  Result Date: 06/27/2019 CLINICAL DATA:  Back pain, history of prostate cancer, new suspected lung cancer EXAM: MRI CERVICAL, THORACIC AND LUMBAR SPINE WITHOUT AND WITH CONTRAST CONTRAST:  7 mL Gadavist TECHNIQUE: Multiplanar and multiecho pulse sequences of the cervical spine, to include the craniocervical junction and cervicothoracic junction, and thoracic and lumbar spine, were obtained without intravenous contrast. COMPARISON:  None. FINDINGS: MRI CERVICAL SPINE Motion artifact is present Alignment: Anteroposterior alignment is maintained. Vertebrae: There is abnormal marrow signal at multiple levels with greatest involvement of C3 and C4 vertebral bodies. There no compression deformity. No significant epidural disease noted. Cord: No definite abnormal signal within the above limitation. Posterior Fossa, vertebral arteries, paraspinal tissues: Unremarkable. Disc levels: Multilevel disc bulges, endplate osteophytes, and facet and uncovertebral hypertrophy. There is no high-grade canal stenosis. Multilevel foraminal stenosis is present and poorly evaluated due to artifact. MRI THORACIC SPINE Motion artifact is present Alignment:  Anteroposterior alignment is maintained. Vertebrae: Multifocal abnormal marrow signal with greatest involvement of T2-T4 and T11 and T12. There is mild to moderate compression deformity of T3 with less than 50% loss of height. Probable mild ventral epidural extension at this level. There is some foraminal extension at T2-T3, T3-T4, and T4-T5 levels. Cord:  No abnormal signal within the above limitation. Paraspinal and other soft tissues: Intrathoracic findings are better evaluated on the prior chest CT. Disc levels: Mild degenerative changes are present without significant degenerative stenosis. MRI LUMBAR SPINE  Motion artifact is present. Segmentation: Standard. Alignment:  Mild degenerative listhesis. Vertebrae: There is multifocal abnormal marrow signal throughout the lumbosacral spine and imaged bony pelvis. There is mild compression deformity of L4 with less than 50% loss of height. Ventral epidural disease is present at this level with resulting severe canal stenosis and crowding of cauda equina. Conus medullaris and cauda equina: Conus extends to the L1 level. Paraspinal and other soft tissues: Unremarkable Disc levels: L1-L2:  Disc bulge.  No significant canal or foraminal stenosis. L2-L3: Disc bulge with endplate osteophytic ridging and facet arthropathy with ligamentum flavum infolding. Moderate canal stenosis. Mild foraminal stenosis. L3-L4: Disc bulge with endplate osteophytic ridging and marked facet arthropathy with ligamentum flavum infolding. Marked canal stenosis with effacement of the lateral recesses. Moderate foraminal stenosis. L4-L5: Disc bulge with endplate osteophytic ridging and moderate right and marked left facet arthropathy with ligamentum flavum infolding. Moderate to marked canal stenosis. Moderate to marked foraminal stenosis. L5-S1: Disc bulge with endplate osteophytic ridging and moderate facet arthropathy. No canal stenosis. Moderate to marked foraminal stenosis. IMPRESSION: Motion degraded study with suboptimal evaluation. Diffuse osseous metastatic disease. Mild to moderate compression deformity of T3 with mild ventral epidural extension. No cord compression. Mild compression deformity of L4 with ventral epidural extension resulting in severe canal stenosis and crowding of cauda equina. Multilevel degenerative changes, greatest  at lumbar levels with significant canal stenosis at L3-L4 and L4-L5. These results were called by telephone at the time of interpretation on 06/27/2019 at 6:21 pm to provider Dr. Vanita Panda, who verbally acknowledged these results. Electronically Signed   By: Macy Mis M.D.   On: 06/27/2019 18:22   CT Angio Chest/Abd/Pel for Dissection W and/or Wo Contrast  Result Date: 06/27/2019 CLINICAL DATA:  79 year old male with history of severe lower abdominal and back pain. EXAM: CT ANGIOGRAPHY CHEST, ABDOMEN AND PELVIS TECHNIQUE: Non-contrast CT of the chest was initially obtained. Multidetector CT imaging through the chest, abdomen and pelvis was performed using the standard protocol during bolus administration of intravenous contrast. Multiplanar reconstructed images and MIPs were obtained and reviewed to evaluate the vascular anatomy. CONTRAST:  177mL OMNIPAQUE IOHEXOL 350 MG/ML SOLN COMPARISON:  CT the abdomen and pelvis 04/20/2012. FINDINGS: CTA CHEST FINDINGS Cardiovascular: Precontrast images demonstrate no crescentic high attenuation associated with the wall of the thoracic aorta to suggest acute intramural hematoma. No aneurysm or dissection of the thoracic aorta. 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 Mid thoracic aortic arch measures 2.9 cm in diameter. Descending thoracic aorta measures 2.8 cm in diameter. Heart size is normal. There is no significant pericardial fluid, thickening or pericardial calcification. There is aortic atherosclerosis, as well as atherosclerosis of the great vessels of the mediastinum and the coronary arteries, including calcified atherosclerotic plaque in the left main, left anterior descending, left circumflex and right coronary arteries. Mediastinum/Nodes: Enlarged AP window lymph node measuring 1.7 cm in short axis. Borderline enlarged left hilar lymph nodes measuring up to 1.3 cm in short axis. Small hiatal hernia. No axillary lymphadenopathy. Lungs/Pleura: Multiple pulmonary nodules are noted, largest of which is in the left upper lobe (axial image 63 of series 7 and sagittal image 141 of series 10) measuring 2.5 x 1.6 x 1.5 cm. This lesion has macrolobulated slightly ill-defined margins. Several other smaller  pulmonary nodules are noted including a 7 x 4 mm left upper lobe nodule (axial image 65 of series 7), and a 1.0 x 0.5 cm nodule in the superior segment of the right lower lobe (axial image 44 of series 7). No acute consolidative airspace disease. No pleural effusions. Musculoskeletal: There are no aggressive appearing lytic or blastic lesions noted in the visualized portions of the skeleton. Review of the MIP images confirms the above findings. CTA ABDOMEN AND PELVIS FINDINGS VASCULAR Aorta: Normal caliber aorta without aneurysm, dissection, vasculitis or significant stenosis. Extensive atherosclerotic disease throughout the abdominal aorta. Celiac: Patent without evidence of dissection, vasculitis or significant stenosis. Mild ectasia of the proximal celiac axis which measures 8 mm in diameter. SMA: Patent without evidence of aneurysm, dissection, vasculitis or significant stenosis. Renals: Both renal arteries are patent without evidence of aneurysm, dissection, vasculitis, fibromuscular dysplasia or significant stenosis. IMA: Patent without evidence of aneurysm, dissection, vasculitis or significant stenosis. Inflow: Patent without evidence of aneurysm, dissection, vasculitis or significant stenosis. Veins: No obvious venous abnormality within the limitations of this arterial phase study. Review of the MIP images confirms the above findings. NON-VASCULAR Hepatobiliary: 1.2 cm low-attenuation lesion in segment 2 of the liver, compatible with a simple cyst. No other larger more suspicious appearing pulmonary nodules or masses are noted. No intra or extrahepatic biliary ductal dilatation. Gallbladder is normal in appearance. Pancreas: No pancreatic mass. No pancreatic ductal dilatation. No pancreatic or peripancreatic fluid collections or inflammatory changes. Spleen: Unremarkable. Adrenals/Urinary Tract: Subcentimeter low-attenuation lesion in the posterior aspect of  the interpolar region of the left kidney, too  small to characterize, but statistically likely to represent a tiny cyst. Right kidney and bilateral adrenal glands are normal in appearance. No hydroureteronephrosis. Urinary bladder is normal in appearance. Stomach/Bowel: Normal appearance of the stomach. No pathologic dilatation of small bowel or colon. Numerous colonic diverticulae are noted, without surrounding inflammatory changes to suggest an acute diverticulitis at this time. The appendix is not confidently identified and may be surgically absent. Regardless, there are no inflammatory changes noted adjacent to the cecum to suggest the presence of an acute appendicitis at this time. Lymphatic: No lymphadenopathy noted in the abdomen or pelvis. Reproductive: Fiducial markers in the prostate gland. Other: No significant volume of ascites.  No pneumoperitoneum. Musculoskeletal: There are no aggressive appearing lytic or blastic lesions noted in the visualized portions of the skeleton. Review of the MIP images confirms the above findings. IMPRESSION: 1. No acute findings are noted in the chest, abdomen or pelvis to account for the patient's history of abdominal pain. 2. Multiple pulmonary nodules in the lungs bilaterally, largest of which is a left upper lobe pulmonary nodule measuring 2.5 x 1.6 x 1.5 cm. This is associated with left hilar and AP window lymphadenopathy. 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 Further evaluation with PET-CT is recommended in the near future. No definite signs of metastatic disease in the abdomen or pelvis. 3. Colonic diverticulosis without evidence of acute diverticulitis at this time. 4. Aortic atherosclerosis, in addition to left main and 3 vessel coronary artery disease. Assessment for potential risk factor modification, dietary therapy or pharmacologic therapy may be warranted, if clinically indicated. 5. 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 Recommend annual imaging followup by CTA or MRA. This recommendation follows 2010 ACCF/AHA/AATS/ACR/ASA/SCA/SCAI/SIR/STS/SVM Guidelines for the Diagnosis and Management of Patients with Thoracic Aortic Disease. Circulation. 2010; 121: M426-S341. Aortic aneurysm NOS (ICD10-I71.9). 6. Additional incidental findings, as above. Electronically Signed   By: Vinnie Langton M.D.   On: 06/27/2019 12:26       LAB RESULTS: Basic Metabolic Panel: Recent Labs  Lab 07/05/19 0525 07/06/19 0459  NA 136 132*  K 4.6 4.8  CL 97* 96*  CO2 29 26  GLUCOSE 136* 131*  BUN 15 18  CREATININE 0.54* 0.58*  CALCIUM 8.8* 8.5*   Liver Function Tests: No results for input(s): AST, ALT, ALKPHOS, BILITOT, PROT, ALBUMIN in the last 168 hours. No results for input(s): LIPASE, AMYLASE in the last 168 hours. No results for input(s): AMMONIA in the last 168 hours. CBC: Recent Labs  Lab 07/05/19 0525 07/05/19 0525 07/06/19 0459  WBC 10.3  --  16.8*  HGB 15.1  --  13.9  HCT 45.7  --  42.8  MCV 95.8   < > 95.5  PLT 293  --  287   < > = values in this interval not displayed.   Cardiac Enzymes: No results for input(s): CKTOTAL, CKMB, CKMBINDEX, TROPONINI in the last 168 hours. BNP: Invalid input(s): POCBNP CBG: No results for input(s): GLUCAP in the last 168 hours.     Disposition and Follow-up: Discharge Instructions    Diet general   Complete by: As directed    Increase activity slowly   Complete by: As directed        DISPOSITION: Home with home health   [place]    Derek Jack, MD Follow up on  07/11/2019.   Specialty: Hematology Why: at 1:30PM Contact information: [place] [address] [phone]        Dettinger, Fransisca Kaufmann, MD Follow up in 2 week(s).   Specialties: Family Medicine, Cardiology Contact information: [place] [address] [phone]        Satira Sark, MD .   Specialty: Cardiology Contact information: [place] [address] [phone]        Care, [facility] Follow up.   Specialty: [place] Why: For home health physical therapy Contact information: [address] [place] [address] [phone]        Cleon Gustin, MD Follow up on 07/18/2019.   Specialty: Urology Why: at 10:00 am Contact information: [place] [address] [phone]            Time coordinating discharge:  58mins   Signed:   Estill Cotta M.D. [facility] 07/06/2019, 3:56 PM

## 2019-07-06 NOTE — TOC Transition Note (Signed)
Transition of Care [facility]) - CM/SW Discharge Note   Patient Details  Name: Tayvin Bellamy Judson. MRN: [id number] Date of Birth: 1941-02-15  Transition of Care [facility]) CM/SW Contact:  Lynnell Catalan, RN Phone Number: 07/06/2019, 11:24 AM   Clinical Narrative:    When speaking with pt again about DC planning pt has decided to go home with home health services. Orders received for [facility] and DME.    Final next level of care: [place] Barriers to Discharge: No Barriers Identified   Patient Goals and CMS Choice Patient states their goals for this hospitalization and ongoing recovery are:: Go home   Choice offered to / list presented to : Patient   Discharge Plan and Services   Discharge Planning Services: CM Consult            DME Arranged: Gilford Rile rolling, Wheelchair manual DME Agency: [employer] Date DME Agency Contacted: 07/06/19 Time DME Agency Contacted: [phone]   [place]: PT HH Agency: [place] Date [facility] Agency Contacted: 07/06/19 Time [place] Agency Contacted: 1100 Representative spoke with at [place]: [place] ([place]) Interventions     Readmission Risk Interventions No flowsheet data found.

## 2019-07-09 ENCOUNTER — Ambulatory Visit
Admission: RE | Admit: 2019-07-09 | Discharge: 2019-07-09 | Disposition: A | Discharge status: Home / Self Care | Payer: Medicare Other | Source: Ambulatory Visit | Attending: Radiation Oncology | Admitting: Radiation Oncology

## 2019-07-09 ENCOUNTER — Other Ambulatory Visit: Payer: Self-pay

## 2019-07-09 DIAGNOSIS — C801 Malignant (primary) neoplasm, unspecified: Secondary | ICD-10-CM | POA: Diagnosis not present

## 2019-07-09 DIAGNOSIS — Z510 Encounter for antineoplastic radiation therapy: Secondary | ICD-10-CM | POA: Diagnosis not present

## 2019-07-09 DIAGNOSIS — C61 Malignant neoplasm of prostate: Secondary | ICD-10-CM | POA: Diagnosis present

## 2019-07-09 DIAGNOSIS — C7951 Secondary malignant neoplasm of bone: Secondary | ICD-10-CM | POA: Diagnosis not present

## 2019-07-10 ENCOUNTER — Other Ambulatory Visit: Payer: Self-pay | Admitting: Urology

## 2019-07-10 ENCOUNTER — Other Ambulatory Visit: Payer: Self-pay

## 2019-07-10 ENCOUNTER — Ambulatory Visit
Admission: RE | Admit: 2019-07-10 | Discharge: 2019-07-10 | Disposition: A | Discharge status: Home / Self Care | Payer: Medicare Other | Source: Ambulatory Visit | Attending: Radiation Oncology | Admitting: Radiation Oncology

## 2019-07-10 DIAGNOSIS — C7951 Secondary malignant neoplasm of bone: Secondary | ICD-10-CM | POA: Diagnosis not present

## 2019-07-10 DIAGNOSIS — C801 Malignant (primary) neoplasm, unspecified: Secondary | ICD-10-CM | POA: Diagnosis not present

## 2019-07-10 DIAGNOSIS — Z510 Encounter for antineoplastic radiation therapy: Secondary | ICD-10-CM | POA: Diagnosis not present

## 2019-07-10 MED ORDER — HYDROMORPHONE HCL 4 MG PO TABS: 4.0000 mg | ORAL_TABLET | ORAL | 0 refills | Status: DC | PRN

## 2019-07-11 ENCOUNTER — Other Ambulatory Visit: Payer: Self-pay

## 2019-07-11 ENCOUNTER — Ambulatory Visit (HOSPITAL_COMMUNITY): Payer: Medicare Other | Admitting: Hematology

## 2019-07-11 ENCOUNTER — Ambulatory Visit
Admission: RE | Admit: 2019-07-11 | Discharge: 2019-07-11 | Disposition: A | Discharge status: Home / Self Care | Payer: Medicare Other | Source: Ambulatory Visit | Attending: Radiation Oncology | Admitting: Radiation Oncology

## 2019-07-11 ENCOUNTER — Encounter: Payer: Self-pay | Admitting: Radiation Oncology

## 2019-07-11 DIAGNOSIS — Z510 Encounter for antineoplastic radiation therapy: Secondary | ICD-10-CM | POA: Diagnosis not present

## 2019-07-11 DIAGNOSIS — C801 Malignant (primary) neoplasm, unspecified: Secondary | ICD-10-CM | POA: Diagnosis not present

## 2019-07-11 DIAGNOSIS — C7951 Secondary malignant neoplasm of bone: Secondary | ICD-10-CM | POA: Diagnosis not present

## 2019-07-12 ENCOUNTER — Telehealth: Payer: Self-pay | Admitting: Radiation Oncology

## 2019-07-12 ENCOUNTER — Inpatient Hospital Stay (HOSPITAL_COMMUNITY): Payer: Medicare Other

## 2019-07-12 ENCOUNTER — Ambulatory Visit (HOSPITAL_COMMUNITY)
Admission: RE | Admit: 2019-07-12 | Discharge: 2019-07-12 | Disposition: A | Discharge status: Home / Self Care | Payer: Medicare Other | Source: Ambulatory Visit | Attending: Hematology | Admitting: Hematology

## 2019-07-12 ENCOUNTER — Encounter (HOSPITAL_COMMUNITY): Payer: Self-pay | Admitting: Hematology

## 2019-07-12 ENCOUNTER — Inpatient Hospital Stay (HOSPITAL_COMMUNITY): Payer: Medicare Other | Attending: Hematology | Admitting: Hematology

## 2019-07-12 VITALS — BP 126/52 | HR 84 | Temp 98.6°F | Resp 18 | Ht 67.5 in | Wt 146.0 lb

## 2019-07-12 DIAGNOSIS — C7951 Secondary malignant neoplasm of bone: Secondary | ICD-10-CM | POA: Insufficient documentation

## 2019-07-12 DIAGNOSIS — C61 Malignant neoplasm of prostate: Secondary | ICD-10-CM | POA: Insufficient documentation

## 2019-07-12 DIAGNOSIS — R338 Other retention of urine: Secondary | ICD-10-CM | POA: Diagnosis not present

## 2019-07-12 DIAGNOSIS — M7989 Other specified soft tissue disorders: Secondary | ICD-10-CM | POA: Insufficient documentation

## 2019-07-12 DIAGNOSIS — R599 Enlarged lymph nodes, unspecified: Secondary | ICD-10-CM

## 2019-07-12 DIAGNOSIS — R591 Generalized enlarged lymph nodes: Secondary | ICD-10-CM

## 2019-07-12 DIAGNOSIS — M79661 Pain in right lower leg: Secondary | ICD-10-CM | POA: Diagnosis not present

## 2019-07-12 DIAGNOSIS — C801 Malignant (primary) neoplasm, unspecified: Secondary | ICD-10-CM

## 2019-07-12 DIAGNOSIS — Z5111 Encounter for antineoplastic chemotherapy: Secondary | ICD-10-CM | POA: Diagnosis not present

## 2019-07-12 DIAGNOSIS — R911 Solitary pulmonary nodule: Secondary | ICD-10-CM

## 2019-07-12 DIAGNOSIS — R600 Localized edema: Secondary | ICD-10-CM | POA: Diagnosis not present

## 2019-07-12 IMAGING — US US EXTREM LOW VENOUS*R*
1 series · 13 of 24 positions shown · non-contrast
Comparison: None.

CLINICAL DATA: Right lower extremity pain and edema

EXAM:
RIGHT LOWER EXTREMITY VENOUS DUPLEX ULTRASOUND
TECHNIQUE: Gray-scale sonography with graded compression, as well as color
Doppler and duplex ultrasound were performed to evaluate the right
lower extremity deep venous system from the level of the common
femoral vein and including the common femoral, femoral, profunda
femoral, popliteal and calf veins including the posterior tibial,
peroneal and gastrocnemius veins when visible. The superficial great
saphenous vein was also interrogated. Spectral Doppler was utilized
to evaluate flow at rest and with distal augmentation maneuvers in
the common femoral, femoral and popliteal veins.

[Series 1: us extrem low venous*right* · 0.07mm/px · 13 of 50 slices shown]
[im 1/50]
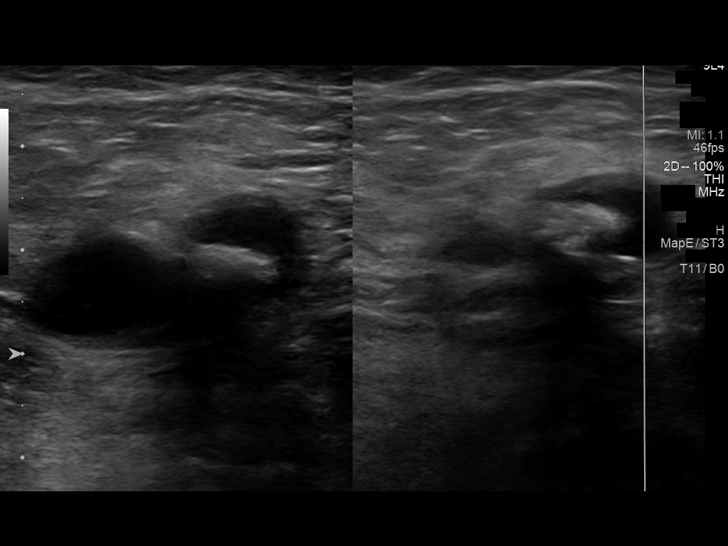
[im 5/50]
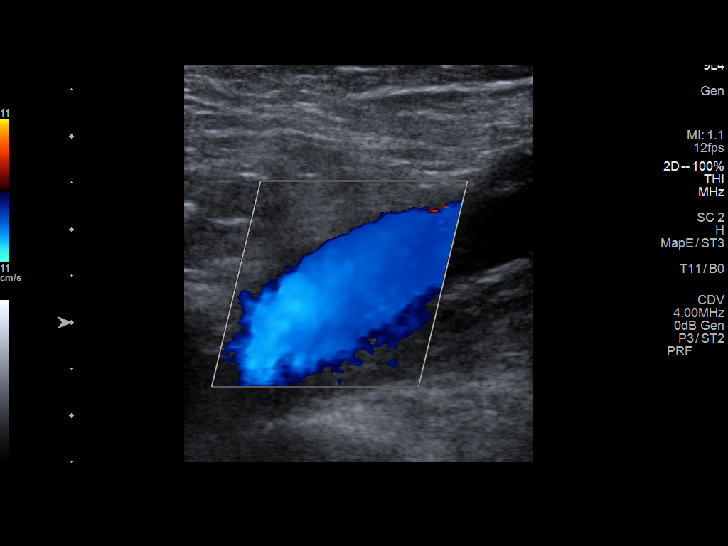
[im 9/50]
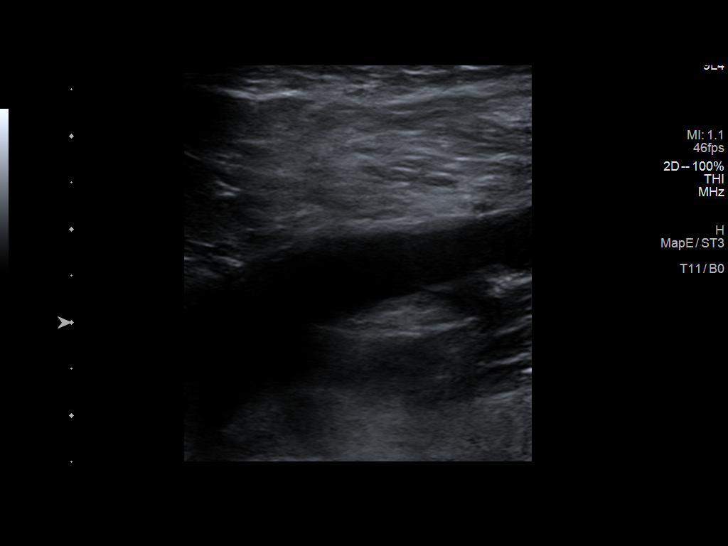
[im 13/50]
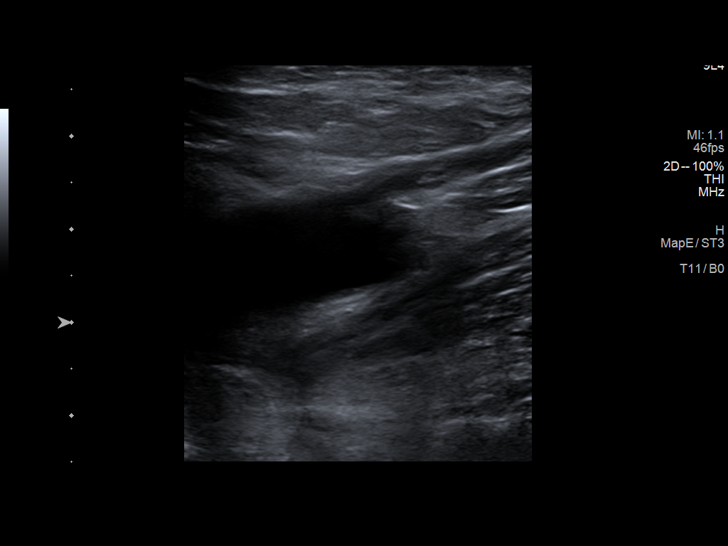
[im 18/50]
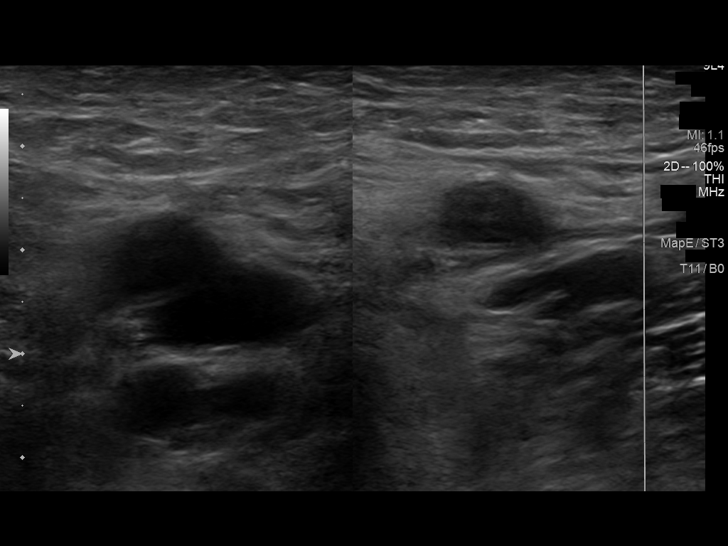
[im 22/50]
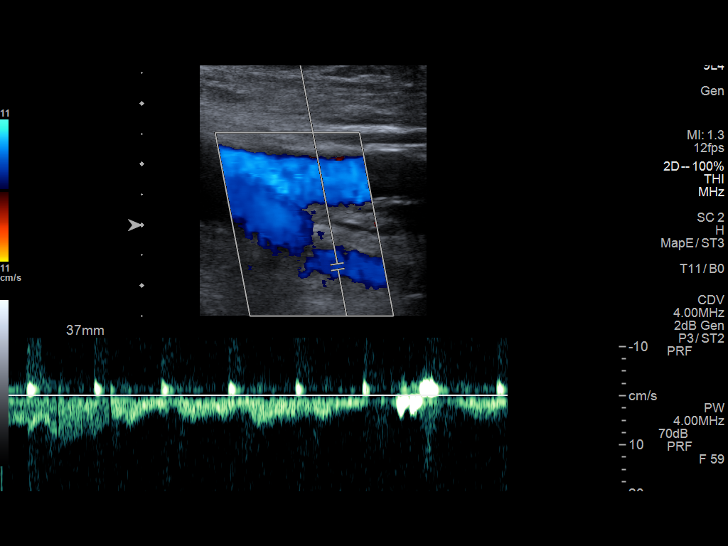
[im 26/50]
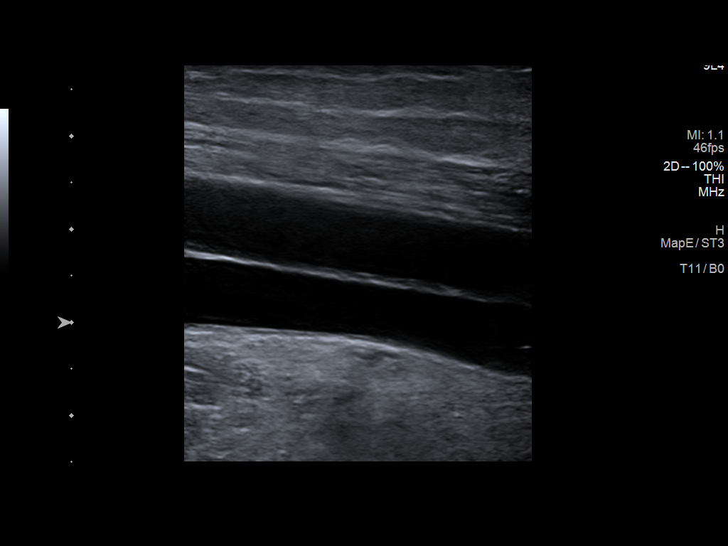
[im 28/50]
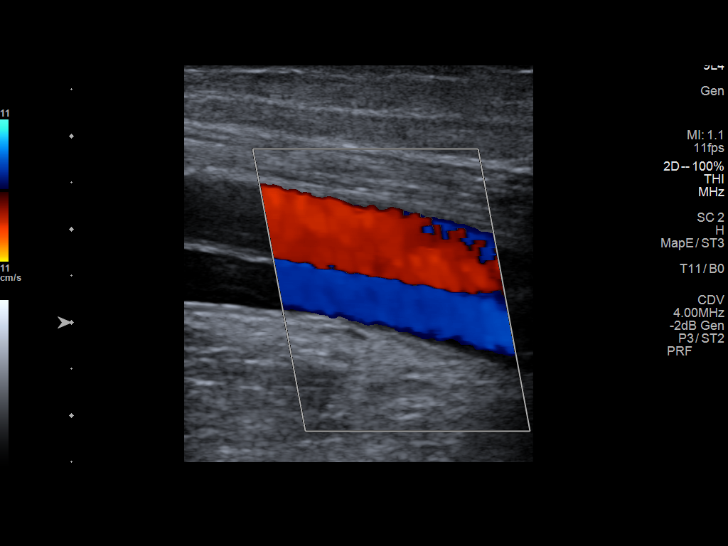
[im 32/50]
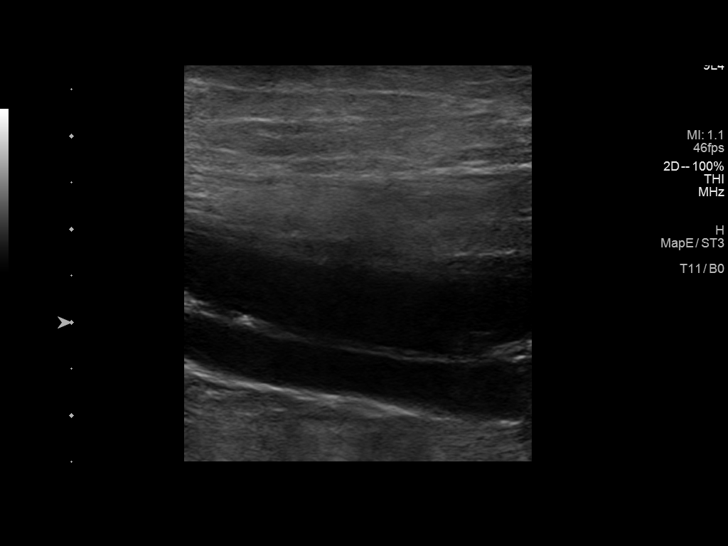
[im 37/50]
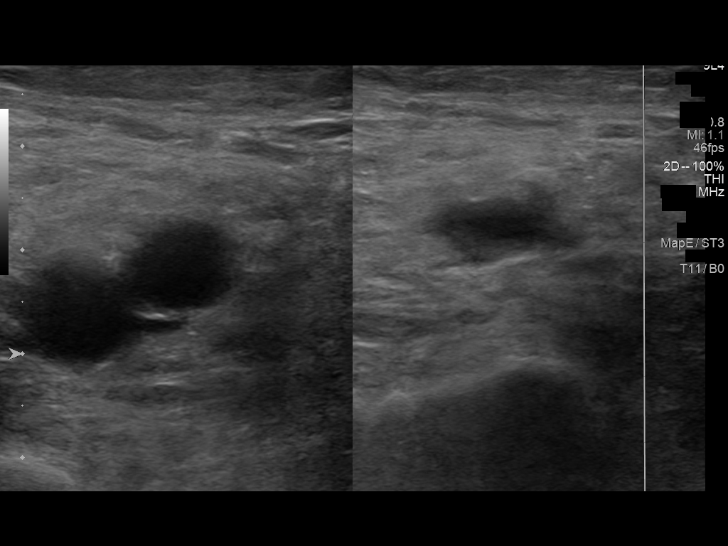
[im 41/50]
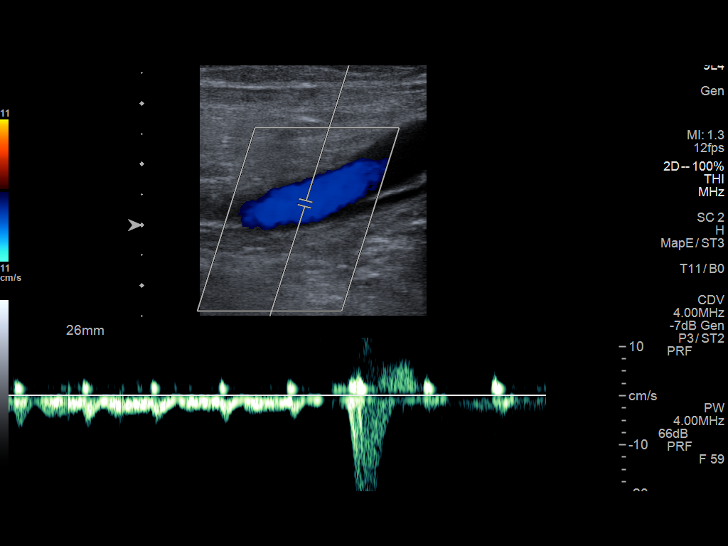
[im 45/50]
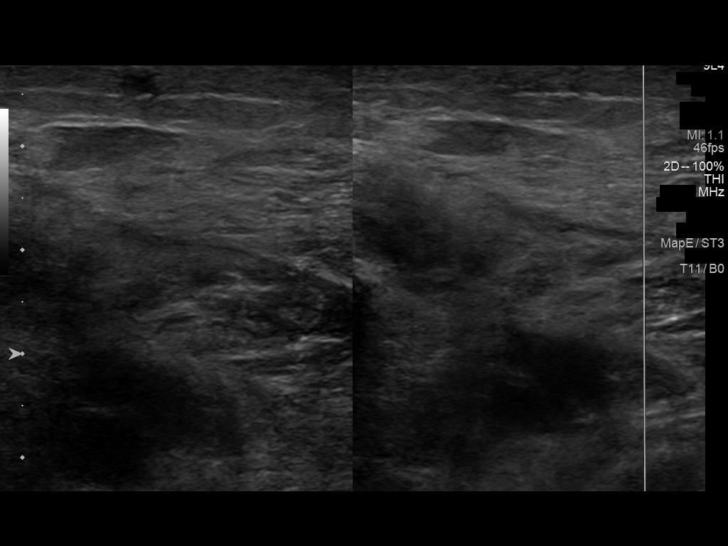
[im 50/50]
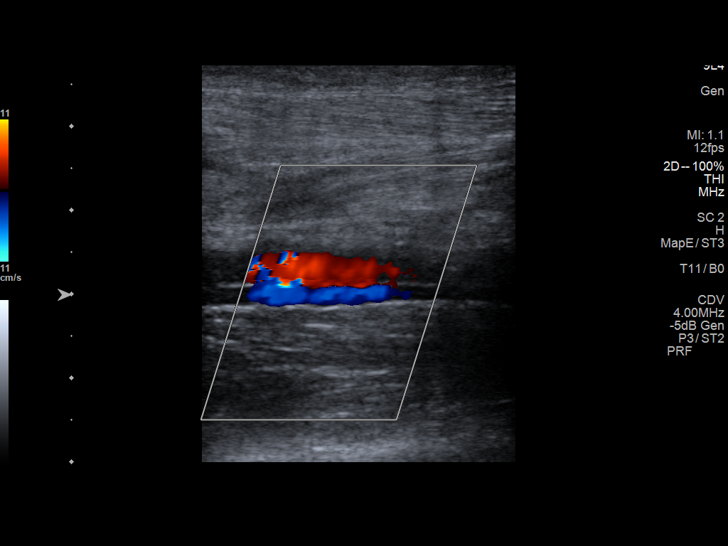

[13 of 24 positions shown; findings below may reference images not displayed]

FINDINGS: Contralateral Common Femoral Vein: Respiratory phasicity is normal
and symmetric with the symptomatic side. No evidence of thrombus.
Normal compressibility.

Common Femoral Vein: No evidence of thrombus. Normal
compressibility, respiratory phasicity and response to augmentation.

Saphenofemoral Junction: No evidence of thrombus. Normal
compressibility and flow on color Doppler imaging.

Profunda Femoral Vein: No evidence of thrombus. Normal
compressibility and flow on color Doppler imaging.

Femoral Vein: No evidence of thrombus. Normal compressibility,
respiratory phasicity and response to augmentation.

Popliteal Vein: No evidence of thrombus. Normal compressibility,
respiratory phasicity and response to augmentation.

Calf Veins: No evidence of thrombus. Normal compressibility and flow
on color Doppler imaging.

Superficial Great Saphenous Vein: No evidence of thrombus. Normal
compressibility.

Venous Reflux:  None.

Other Findings:  None.
IMPRESSION: No evidence of deep venous thrombosis in the right lower extremity.
Left common femoral vein also

## 2019-07-12 MED ORDER — LEUPROLIDE ACETATE (6 MONTH) 45 MG ~~LOC~~ KIT
45.0000 mg | PACK | Freq: Once | SUBCUTANEOUS | Status: AC
  Administered 2019-07-12: 45 mg via SUBCUTANEOUS
  Filled 2019-07-12: qty 45

## 2019-07-12 NOTE — Patient Instructions (Addendum)
[place] at [facility] Discharge Instructions  You were seen today by Dr. Delton Coombes. He went over your history, family history and how you've been feeling lately. He will schedule you for a PET scan. He will schedule you for a doppler of your right leg to evaluate your swelling. He will try to give you a Lupron injection today before you leave. He will see you back after your scan forfollow up.   Thank you for choosing [place] at [facility] to provide your oncology and hematology care.  To afford each patient quality time with our provider, please arrive at least 15 minutes before your scheduled appointment time.   If you have a lab appointment with the [place] please come in thru the  Main Entrance and check in at the main information desk  You need to re-schedule your appointment should you arrive 10 or more minutes late.  We strive to give you quality time with our providers, and arriving late affects you and other patients whose appointments are after yours.  Also, if you no show three or more times for appointments you may be dismissed from the clinic at the providers discretion.     Again, thank you for choosing [facility].  Our hope is that these requests will decrease the amount of time that you wait before being seen by our physicians.       _____________________________________________________________  Should you have questions after your visit to [facility], please contact our office at [phone] between the hours of 8:00 a.m. and 4:30 p.m.  Voicemails left after 4:00 p.m. will not be returned until the following business day.  For prescription refill requests, have your pharmacy contact our office and allow 72 hours.    Cancer Center Support Programs:   > Cancer Support Group  2nd Tuesday of the month 1pm-2pm, Journey Room

## 2019-07-12 NOTE — Assessment & Plan Note (Addendum)
1.  Prostate cancer: -History of prostate cancer diagnosed on 08/28/2014, Gleason 4+4= 8, status post radiation therapy and seed implants. -Lower back pain for the last several months, worse in the last 3 weeks leading to hospitalization. -PSA on admission on 06/19/2019 was 239, testosterone 210.  Alk phos elevated at 306. -MRI of the cervical, thoracic and lumbar spine on 06/27/2019 showed diffuse bone metastatic disease.  Mild to moderate compression deformity of T3 with mild ventral epidural extension.  No cord compression.  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 -CT angio of the chest, abdomen and pelvis on 06/27/2019 showed multiple lung nodules bilaterally, largest in the left upper lobe measuring 2.5 x 1.6 x 1.5 cm with associated left hilar and AP window lymphadenopathy. -Navigational bronchoscopy and biopsy on 07/02/2019 with left upper lobe brushing and washing showing atypical cells.  10 L FNA also showing atypical cells. -He denied any weight loss or GI symptoms. -He smoked a pack per day for 4-5 years and quit 60 years ago.  He is a [place] [employer] veteran and had agent orange exposure. -We will give him Lupron 45 mg intramuscularly as soon as we get insurance authorization. -As the findings are concerning for primary bronchogenic carcinoma, we'll obtain a PET CT scan to identify the area for biopsy.  2.  Lower back pain: -Reports lower back pain radiating down the posterior right thigh.  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 -10 treatments of radiation therapy completed on 07/11/2019.  Did not notice any improvement in the pain yet. -He is taking Dilaudid 4 mg every 4 hours even during the nighttime.  He was told to take stool softener with it.  3.  Right leg swelling: -He has no distal  swelling of the right ankle for last 1 to 2 days.  Denies any pain.  No pleuritic chest pain. -We will send him for a Doppler.  If positive for DVT, will start him on DOAC.  4.  Urinary retention: -He has an indwelling Foley catheter.

## 2019-07-12 NOTE — Progress Notes (Signed)
Keith Olson. tolerated Eligard injection well without complaints or incident. Pt given written information on this drug to carry home with him. Pt discharged via wheelchair in satisfactory condition accompanied by family member

## 2019-07-12 NOTE — Progress Notes (Signed)
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  HISTORY OF PRESENTING ILLNESS:  Keith Olson. 79 y.o. male is seen in consultation today at the request of Dr. Warrick Parisian for further work-up and management of metastatic cancer.  He had a history of prostate cancer diagnosed on 08/28/2014, Gleason 4+4= 8.  He underwent radiation therapy along with seed implants.  He has started developing lower back pain for the last 6 months.  This pain has gotten worse in the last 2 to 3 weeks.  He went to the emergency room for this lower back pain which is radiating down the right leg.  A CT angio CAP on 06/27/2019 showed multiple pulmonary nodules, small, subcentimeter bilaterally, largest of which is a left upper lobe pulmonary nodule measuring 2.5 x 1.6 x 1.5 cm.  There is left hilar and AP window adenopathy.  No definitive signs of metastatic disease in the abdomen or pelvis.  MRI of the cervical, thoracic and lumbar spine on 06/27/2019 showed diffuse bone metastatic disease.  Mild to moderate compression deformity of T3 with mild ventral epidural extension.  No cord compression.  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  PSA on admission was elevated at 239.  He was started on Casodex in anticipation of giving him Lupron.  He is aware that no more Bactrim and smoked for 4-5 years, pack a day while he was in service.  He quit smoking 60 years ago.  He had exposure to agent orange.  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  No family history of  malignancies.  He lives at home with his wife.  His son is accompanying him today.  He is to do all his activities of ADLs and IADLs prior to hospitalization.  He has an indwelling Foley catheter due to urinary retention.  MEDICAL HISTORY:  Past Medical History:  Diagnosis Date  . Agent orange exposure   . Arthritis   . Coronary artery disease    BMS to mid LAD 2001, DES to proximal LAD 2017  . Enlarged prostate    XRT 2016  . Headache    Sinus headaches   . History of depression   . Hyperlipidemia   . Hypothyroidism   . Prostate cancer ([place]) 07/2014   hormonal therapy, external beam radiation therapy  . PTSD (post-traumatic stress disorder)     SURGICAL HISTORY: Past Surgical History:  Procedure Laterality Date  . BRONCHIAL BRUSHINGS  07/02/2019   Procedure: BRONCHIAL BRUSHINGS;  Surgeon: Rigoberto Noel, MD;  Location: [facility];  Service: Cardiopulmonary;;  . BRONCHIAL NEEDLE ASPIRATION BIOPSY  07/02/2019   Procedure: BRONCHIAL NEEDLE ASPIRATION BIOPSIES;  Surgeon: Rigoberto Noel, MD;  Location: [facility];  Service: Cardiopulmonary;;  . BRONCHIAL WASHINGS  07/02/2019   Procedure: BRONCHIAL WASHINGS;  Surgeon: Rigoberto Noel, MD;  Location: [facility];  Service: Cardiopulmonary;;  . CARDIAC CATHETERIZATION N/A 12/12/2015   Procedure: Left Heart Cath and Coronary Angiography;  Surgeon: Peter M Martinique, MD;  Location: [place] [facility];  Service: Cardiovascular;  Laterality: N/A;  . CARDIAC CATHETERIZATION N/A 12/12/2015   Procedure: Coronary  Stent Intervention;  Surgeon: Peter M Martinique, MD;  Location: [place] [facility];  Service: Cardiovascular;  Laterality: N/A;  . CORONARY STENT INTERVENTION N/A 09/06/2017   Procedure: CORONARY STENT INTERVENTION;  Surgeon: Burnell Blanks, MD;  Location: [place] [facility];  Service: Cardiovascular;  Laterality: N/A;  . ENDOBRONCHIAL ULTRASOUND N/A 07/02/2019   Procedure: ENDOBRONCHIAL ULTRASOUND;  Surgeon: Rigoberto Noel, MD;   Location: [facility];  Service: Cardiopulmonary;  Laterality: N/A;  . FLEXIBLE BRONCHOSCOPY  07/02/2019   Procedure: FLEXIBLE BRONCHOSCOPY;  Surgeon: Rigoberto Noel, MD;  Location: [facility];  Service: Cardiopulmonary;;  . HERNIA REPAIR Right   . LEFT HEART CATH AND CORONARY ANGIOGRAPHY N/A 09/02/2016   Procedure: Left Heart Cath and Coronary Angiography;  Surgeon: Leonie Man, MD;  Location: [place] [facility];  Service: Cardiovascular;  Laterality: N/A;  . LEFT HEART CATH AND CORONARY ANGIOGRAPHY N/A 09/06/2017   Procedure: LEFT HEART CATH AND CORONARY ANGIOGRAPHY;  Surgeon: Burnell Blanks, MD;  Location: [place] [facility];  Service: Cardiovascular;  Laterality: N/A;  . LEFT HEART CATH AND CORONARY ANGIOGRAPHY N/A 05/04/2019   Procedure: LEFT HEART CATH AND CORONARY ANGIOGRAPHY;  Surgeon: Martinique, Peter M, MD;  Location: [place] [facility];  Service: Cardiovascular;  Laterality: N/A;  . PROSTATE BIOPSY N/A 08/28/2014   Procedure: BIOPSY TRANSRECTAL ULTRASONIC PROSTATE (TUBP);  Surgeon: Rana Snare, MD;  Location: [facility];  Service: Urology;  Laterality: N/A;  . TRANSURETHRAL RESECTION OF PROSTATE N/A 08/28/2014   Procedure: TRANSURETHRAL RESECTION OF THE PROSTATE WITH GYRUS INSTRUMENTS;  Surgeon: Rana Snare, MD;  Location: [facility];  Service: Urology;  Laterality: N/A;    SOCIAL HISTORY: Social History   Socioeconomic History  . Marital status: Married    Spouse name: Not on file  . Number of children: 5  . Years of education: Not on file  . Highest education level: Not on file  Occupational History  . Occupation: retired  Tobacco Use  . Smoking status: Never Smoker  . Smokeless tobacco: Former Systems developer    Types: Chew  Substance and Sexual Activity  . Alcohol use: No  . Drug use: No  . Sexual activity: Yes  Other Topics Concern  . Not on file  Social History Narrative   Married   5 children   Social Determinants of Health   Financial Resource Strain: Low Risk   .  Difficulty of Paying Living Expenses: Not hard at all  Food Insecurity: No Food Insecurity  . Worried About Charity fundraiser in the Last Year: Never true  . Ran Out of Food in the Last Year: Never true  Transportation Needs: No Transportation Needs  . Lack of Transportation (Medical): No  . Lack of Transportation (Non-Medical): No  Physical Activity: Inactive  . Days of Exercise per Week: 0 days  . Minutes of Exercise per Session: 0 min  Stress: No Stress Concern Present  . Feeling of Stress : Not at all  Social Connections: Somewhat Isolated  . Frequency of Communication with Friends and Family: More than three times a week  . Frequency of Social Gatherings with Friends and Family: More than three times a week  . Attends Religious Services: Never  . Active Member of Clubs or Organizations: No  . Attends Archivist Meetings: Never  . Marital Status: Married  Human resources officer Violence: Not At Risk  . Fear of Current or Ex-Partner: No  . Emotionally Abused: No  . Physically Abused: No  .  Sexually Abused: No    FAMILY HISTORY: Family History  Problem Relation Age of Onset  . Arthritis Mother   . Heart attack Father     ALLERGIES:  is allergic to lipitor [atorvastatin].  MEDICATIONS:  Current Outpatient Medications  Medication Sig Dispense Refill  . ALPRAZolam (XANAX) 0.5 MG tablet Take 0.5 mg by mouth 2 (two) times daily as needed.    Marland Kitchen aspirin EC 81 MG tablet Take 1 tablet (81 mg total) by mouth daily.    . bicalutamide (CASODEX) 50 MG tablet Take 1 tablet (50 mg total) by mouth daily. 30 tablet 0  . dexamethasone (DECADRON) 4 MG tablet Take 1 tablet (4 mg total) by mouth 2 (two) times daily with a meal. 60 tablet 1  . diclofenac sodium (VOLTAREN) 1 % GEL APPLY 2 GRAMS TOPICALLY 4 TIMES DAILY (Patient taking differently: Apply 2 g topically 4 (four) times daily as needed. ) 100 g 5  . fentaNYL (DURAGESIC) 25 MCG/HR Place 1 patch onto the skin every 3 (three)  days. 5 patch 0  . gabapentin (NEURONTIN) 400 MG capsule Take 1 capsule (400 mg total) by mouth 3 (three) times daily. 90 capsule 3  . HYDROmorphone (DILAUDID) 4 MG tablet Take 1 tablet (4 mg total) by mouth every 4 (four) hours as needed for moderate pain or severe pain. 60 tablet 0  . levothyroxine (SYNTHROID, LEVOTHROID) 75 MCG tablet Take 1 tablet (75 mcg total) by mouth daily before breakfast. 90 tablet 3  . lidocaine (LIDODERM) 5 % Place 1 patch onto the skin daily. Remove & Discard patch within 12 hours or as directed by MD 30 patch 0  . meclizine (ANTIVERT) 25 MG tablet Take 1 tablet (25 mg total) by mouth 2 (two) times daily as needed for dizziness. 30 tablet 0  . nitroGLYCERIN (NITROSTAT) 0.4 MG SL tablet Place 1 tablet (0.4 mg total) under the tongue every 5 (five) minutes as needed for chest pain. X 3 doses (Patient taking differently: Place 0.4 mg under the tongue every 5 (five) minutes as needed for chest pain. ) 25 tablet 12  . pantoprazole (PROTONIX) 40 MG tablet Take 1 tablet (40 mg total) by mouth daily. 30 tablet 3  . polyethylene glycol (MIRALAX / GLYCOLAX) 17 g packet Take 17 g by mouth daily. 30 each 3  . rosuvastatin (CRESTOR) 40 MG tablet Take 1 tablet (40 mg total) by mouth daily. 30 tablet 6  . senna-docusate (SENOKOT S) 8.6-50 MG tablet Take 2 tablets by mouth 2 (two) times daily. 120 tablet 3  . SUMAtriptan (IMITREX) 25 MG tablet TAKE 1 TABLET BY MOUTH EVERY 2 HOURS AS NEEDED FOR MIGRAINE. MAY REPEAT IN 2 HOURS IF HEADACHE PERSISTS OR RECURS (Patient taking differently: Take 25 mg by mouth every 2 (two) hours as needed for migraine or headache. TAKE 1 TABLET BY MOUTH EVERY 2 HOURS AS NEEDED FOR MIGRAINE. MAY REPEAT IN 2 HOURS IF HEADACHE PERSISTS OR RECURS) 10 tablet 0  . tamsulosin (FLOMAX) 0.4 MG CAPS capsule Take 0.4 mg by mouth at bedtime.     No current facility-administered medications for this visit.    REVIEW OF SYSTEMS:   Constitutional: Denies fevers, chills or  abnormal night sweats Eyes: Denies blurriness of vision, double vision or watery eyes Ears, nose, mouth, throat, and face: Denies mucositis or sore throat Respiratory: Denies cough, dyspnea or wheezes Cardiovascular: Denies palpitation, chest discomfort.  Positive for right leg swelling. Gastrointestinal:  Denies nausea, heartburn.  Positive for constipation.  Skin: Denies abnormal skin rashes Lymphatics: Denies new lymphadenopathy or easy bruising Neurological:Denies numbness, tingling or new weaknesses.  Low back pain present. Behavioral/Psych: Mood is stable, no new changes  All other systems were reviewed with the patient and are negative.  PHYSICAL EXAMINATION: ECOG PERFORMANCE STATUS: 2 - Symptomatic, <50% confined to bed  Vitals:   07/12/19 1318  BP: (!) 126/52  Pulse: 84  Resp: 18  Temp: 98.6 F (37 C)  SpO2: 94%   Filed Weights   07/12/19 1318  Weight: 146 lb (66.2 kg)    GENERAL:alert, no distress and comfortable SKIN: skin color, texture, turgor are normal, no rashes or significant lesions EYES: normal, conjunctiva are pink and non-injected, sclera clear OROPHARYNX:no exudate, no erythema and lips, buccal mucosa, and tongue normal  NECK: supple, thyroid normal size, non-tender, without nodularity LYMPH:  no palpable lymphadenopathy in the cervical, axillary or inguinal LUNGS: clear to auscultation and percussion with normal breathing effort HEART: regular rate & rhythm and no murmurs.  The right leg 2+ edema. ABDOMEN:abdomen soft, non-tender and normal bowel sounds Musculoskeletal:no cyanosis of digits and no clubbing  PSYCH: alert & oriented x 3 with fluent speech NEURO: no focal motor/sensory deficits.  Foley catheter present.  LABORATORY DATA:  I have reviewed the data as listed Lab Results  Component Value Date   WBC 16.8 (H) 07/06/2019   HGB 13.9 07/06/2019   HCT 42.8 07/06/2019   MCV 95.5 07/06/2019   PLT 287 07/06/2019     Chemistry       Component Value Date/Time   NA 132 (L) 07/06/2019 0459   NA 138 11/13/2018 1019   K 4.8 07/06/2019 0459   CL 96 (L) 07/06/2019 0459   CO2 26 07/06/2019 0459   BUN 18 07/06/2019 0459   BUN 11 11/13/2018 1019   CREATININE 0.58 (L) 07/06/2019 0459   CREATININE 0.79 08/17/2012 0944      Component Value Date/Time   CALCIUM 8.5 (L) 07/06/2019 0459   ALKPHOS 306 (H) 06/27/2019 1107   AST 28 06/27/2019 1107   ALT 23 06/27/2019 1107   BILITOT 0.7 06/27/2019 1107   BILITOT 0.3 11/13/2018 1019       RADIOGRAPHIC STUDIES: I have personally reviewed the radiological images as listed and agreed with the findings in the report. CT HEAD WO CONTRAST  Result Date: 07/06/2019 CLINICAL DATA:  Vertigo EXAM: CT HEAD WITHOUT CONTRAST TECHNIQUE: Contiguous axial images were obtained from the base of the skull through the vertex without intravenous contrast. COMPARISON:  None. FINDINGS: Brain: No evidence of acute infarction, hemorrhage, hydrocephalus, extra-axial collection or mass lesion/mass effect. Vascular: No hyperdense vessel or unexpected calcification. Skull: No osseous abnormality. Sinuses/Orbits: Visualized paranasal sinuses are clear. Visualized mastoid sinuses are clear. Visualized orbits demonstrate no focal abnormality. Other: None IMPRESSION: No acute intracranial pathology. Electronically Signed   By: Kathreen Devoid   On: 07/06/2019 13:39   MR CERVICAL SPINE W WO CONTRAST  Result Date: 06/27/2019 CLINICAL DATA:  Back pain, history of prostate cancer, new suspected lung cancer EXAM: MRI CERVICAL, THORACIC AND LUMBAR SPINE WITHOUT AND WITH CONTRAST CONTRAST:  7 mL Gadavist TECHNIQUE: Multiplanar and multiecho pulse sequences of the cervical spine, to include the craniocervical junction and cervicothoracic junction, and thoracic and lumbar spine, were obtained without intravenous contrast. COMPARISON:  None. FINDINGS: MRI CERVICAL SPINE Motion artifact is present Alignment: Anteroposterior  alignment is maintained. Vertebrae: There is abnormal marrow signal at multiple levels with greatest involvement of C3 and C4 vertebral  bodies. There no compression deformity. No significant epidural disease noted. Cord: No definite abnormal signal within the above limitation. Posterior Fossa, vertebral arteries, paraspinal tissues: Unremarkable. Disc levels: Multilevel disc bulges, endplate osteophytes, and facet and uncovertebral hypertrophy. There is no high-grade canal stenosis. Multilevel foraminal stenosis is present and poorly evaluated due to artifact. MRI THORACIC SPINE Motion artifact is present Alignment:  Anteroposterior alignment is maintained. Vertebrae: Multifocal abnormal marrow signal with greatest involvement of T2-T4 and T11 and T12. There is mild to moderate compression deformity of T3 with less than 50% loss of height. Probable mild ventral epidural extension at this level. There is some foraminal extension at T2-T3, T3-T4, and T4-T5 levels. Cord:  No abnormal signal within the above limitation. Paraspinal and other soft tissues: Intrathoracic findings are better evaluated on the prior chest CT. Disc levels: Mild degenerative changes are present without significant degenerative stenosis. MRI LUMBAR SPINE Motion artifact is present. Segmentation: Standard. Alignment:  Mild degenerative listhesis. Vertebrae: There is multifocal abnormal marrow signal throughout the lumbosacral spine and imaged bony pelvis. There is mild compression deformity of L4 with less than 50% loss of height. Ventral epidural disease is present at this level with resulting severe canal stenosis and crowding of cauda equina. Conus medullaris and cauda equina: Conus extends to the L1 level. Paraspinal and other soft tissues: Unremarkable Disc levels: L1-L2:  Disc bulge.  No significant canal or foraminal stenosis. L2-L3: Disc bulge with endplate osteophytic ridging and facet arthropathy with ligamentum flavum infolding.  Moderate canal stenosis. Mild foraminal stenosis. L3-L4: Disc bulge with endplate osteophytic ridging and marked facet arthropathy with ligamentum flavum infolding. Marked canal stenosis with effacement of the lateral recesses. Moderate foraminal stenosis. L4-L5: Disc bulge with endplate osteophytic ridging and moderate right and marked left facet arthropathy with ligamentum flavum infolding. Moderate to marked canal stenosis. Moderate to marked foraminal stenosis. L5-S1: Disc bulge with endplate osteophytic ridging and moderate facet arthropathy. No canal stenosis. Moderate to marked foraminal stenosis. IMPRESSION: Motion degraded study with suboptimal evaluation. Diffuse osseous metastatic disease. Mild to moderate compression deformity of T3 with mild ventral epidural extension. No cord compression. Mild compression deformity of L4 with ventral epidural extension resulting in severe canal stenosis and crowding of cauda equina. Multilevel degenerative changes, greatest at lumbar levels with significant canal stenosis at L3-L4 and L4-L5. These results were called by telephone at the time of interpretation on 06/27/2019 at 6:21 pm to provider Dr. Vanita Panda, who verbally acknowledged these results. Electronically Signed   By: Macy Mis M.D.   On: 06/27/2019 18:22   MR THORACIC SPINE W WO CONTRAST  Result Date: 06/27/2019 CLINICAL DATA:  Back pain, history of prostate cancer, new suspected lung cancer EXAM: MRI CERVICAL, THORACIC AND LUMBAR SPINE WITHOUT AND WITH CONTRAST CONTRAST:  7 mL Gadavist TECHNIQUE: Multiplanar and multiecho pulse sequences of the cervical spine, to include the craniocervical junction and cervicothoracic junction, and thoracic and lumbar spine, were obtained without intravenous contrast. COMPARISON:  None. FINDINGS: MRI CERVICAL SPINE Motion artifact is present Alignment: Anteroposterior alignment is maintained. Vertebrae: There is abnormal marrow signal at multiple levels with greatest  involvement of C3 and C4 vertebral bodies. There no compression deformity. No significant epidural disease noted. Cord: No definite abnormal signal within the above limitation. Posterior Fossa, vertebral arteries, paraspinal tissues: Unremarkable. Disc levels: Multilevel disc bulges, endplate osteophytes, and facet and uncovertebral hypertrophy. There is no high-grade canal stenosis. Multilevel foraminal stenosis is present and poorly evaluated due to artifact. MRI THORACIC SPINE Motion  artifact is present Alignment:  Anteroposterior alignment is maintained. Vertebrae: Multifocal abnormal marrow signal with greatest involvement of T2-T4 and T11 and T12. There is mild to moderate compression deformity of T3 with less than 50% loss of height. Probable mild ventral epidural extension at this level. There is some foraminal extension at T2-T3, T3-T4, and T4-T5 levels. Cord:  No abnormal signal within the above limitation. Paraspinal and other soft tissues: Intrathoracic findings are better evaluated on the prior chest CT. Disc levels: Mild degenerative changes are present without significant degenerative stenosis. MRI LUMBAR SPINE Motion artifact is present. Segmentation: Standard. Alignment:  Mild degenerative listhesis. Vertebrae: There is multifocal abnormal marrow signal throughout the lumbosacral spine and imaged bony pelvis. There is mild compression deformity of L4 with less than 50% loss of height. Ventral epidural disease is present at this level with resulting severe canal stenosis and crowding of cauda equina. Conus medullaris and cauda equina: Conus extends to the L1 level. Paraspinal and other soft tissues: Unremarkable Disc levels: L1-L2:  Disc bulge.  No significant canal or foraminal stenosis. L2-L3: Disc bulge with endplate osteophytic ridging and facet arthropathy with ligamentum flavum infolding. Moderate canal stenosis. Mild foraminal stenosis. L3-L4: Disc bulge with endplate osteophytic ridging and  marked facet arthropathy with ligamentum flavum infolding. Marked canal stenosis with effacement of the lateral recesses. Moderate foraminal stenosis. L4-L5: Disc bulge with endplate osteophytic ridging and moderate right and marked left facet arthropathy with ligamentum flavum infolding. Moderate to marked canal stenosis. Moderate to marked foraminal stenosis. L5-S1: Disc bulge with endplate osteophytic ridging and moderate facet arthropathy. No canal stenosis. Moderate to marked foraminal stenosis. IMPRESSION: Motion degraded study with suboptimal evaluation. Diffuse osseous metastatic disease. Mild to moderate compression deformity of T3 with mild ventral epidural extension. No cord compression. Mild compression deformity of L4 with ventral epidural extension resulting in severe canal stenosis and crowding of cauda equina. Multilevel degenerative changes, greatest at lumbar levels with significant canal stenosis at L3-L4 and L4-L5. These results were called by telephone at the time of interpretation on 06/27/2019 at 6:21 pm to provider Dr. Vanita Panda, who verbally acknowledged these results. Electronically Signed   By: Macy Mis M.D.   On: 06/27/2019 18:22   MR Lumbar Spine W Wo Contrast  Result Date: 06/27/2019 CLINICAL DATA:  Back pain, history of prostate cancer, new suspected lung cancer EXAM: MRI CERVICAL, THORACIC AND LUMBAR SPINE WITHOUT AND WITH CONTRAST CONTRAST:  7 mL Gadavist TECHNIQUE: Multiplanar and multiecho pulse sequences of the cervical spine, to include the craniocervical junction and cervicothoracic junction, and thoracic and lumbar spine, were obtained without intravenous contrast. COMPARISON:  None. FINDINGS: MRI CERVICAL SPINE Motion artifact is present Alignment: Anteroposterior alignment is maintained. Vertebrae: There is abnormal marrow signal at multiple levels with greatest involvement of C3 and C4 vertebral bodies. There no compression deformity. No significant epidural disease  noted. Cord: No definite abnormal signal within the above limitation. Posterior Fossa, vertebral arteries, paraspinal tissues: Unremarkable. Disc levels: Multilevel disc bulges, endplate osteophytes, and facet and uncovertebral hypertrophy. There is no high-grade canal stenosis. Multilevel foraminal stenosis is present and poorly evaluated due to artifact. MRI THORACIC SPINE Motion artifact is present Alignment:  Anteroposterior alignment is maintained. Vertebrae: Multifocal abnormal marrow signal with greatest involvement of T2-T4 and T11 and T12. There is mild to moderate compression deformity of T3 with less than 50% loss of height. Probable mild ventral epidural extension at this level. There is some foraminal extension at T2-T3, T3-T4, and T4-T5 levels. Cord:  No abnormal signal within the above limitation. Paraspinal and other soft tissues: Intrathoracic findings are better evaluated on the prior chest CT. Disc levels: Mild degenerative changes are present without significant degenerative stenosis. MRI LUMBAR SPINE Motion artifact is present. Segmentation: Standard. Alignment:  Mild degenerative listhesis. Vertebrae: There is multifocal abnormal marrow signal throughout the lumbosacral spine and imaged bony pelvis. There is mild compression deformity of L4 with less than 50% loss of height. Ventral epidural disease is present at this level with resulting severe canal stenosis and crowding of cauda equina. Conus medullaris and cauda equina: Conus extends to the L1 level. Paraspinal and other soft tissues: Unremarkable Disc levels: L1-L2:  Disc bulge.  No significant canal or foraminal stenosis. L2-L3: Disc bulge with endplate osteophytic ridging and facet arthropathy with ligamentum flavum infolding. Moderate canal stenosis. Mild foraminal stenosis. L3-L4: Disc bulge with endplate osteophytic ridging and marked facet arthropathy with ligamentum flavum infolding. Marked canal stenosis with effacement of the  lateral recesses. Moderate foraminal stenosis. L4-L5: Disc bulge with endplate osteophytic ridging and moderate right and marked left facet arthropathy with ligamentum flavum infolding. Moderate to marked canal stenosis. Moderate to marked foraminal stenosis. L5-S1: Disc bulge with endplate osteophytic ridging and moderate facet arthropathy. No canal stenosis. Moderate to marked foraminal stenosis. IMPRESSION: Motion degraded study with suboptimal evaluation. Diffuse osseous metastatic disease. Mild to moderate compression deformity of T3 with mild ventral epidural extension. No cord compression. Mild compression deformity of L4 with ventral epidural extension resulting in severe canal stenosis and crowding of cauda equina. Multilevel degenerative changes, greatest at lumbar levels with significant canal stenosis at L3-L4 and L4-L5. These results were called by telephone at the time of interpretation on 06/27/2019 at 6:21 pm to provider Dr. Vanita Panda, who verbally acknowledged these results. Electronically Signed   By: Macy Mis M.D.   On: 06/27/2019 18:22   CT Angio Chest/Abd/Pel for Dissection W and/or Wo Contrast  Result Date: 06/27/2019 CLINICAL DATA:  79 year old male with history of severe lower abdominal and back pain. EXAM: CT ANGIOGRAPHY CHEST, ABDOMEN AND PELVIS TECHNIQUE: Non-contrast CT of the chest was initially obtained. Multidetector CT imaging through the chest, abdomen and pelvis was performed using the standard protocol during bolus administration of intravenous contrast. Multiplanar reconstructed images and MIPs were obtained and reviewed to evaluate the vascular anatomy. CONTRAST:  145m OMNIPAQUE IOHEXOL 350 MG/ML SOLN COMPARISON:  CT the abdomen and pelvis 04/20/2012. FINDINGS: CTA CHEST FINDINGS Cardiovascular: Precontrast images demonstrate no crescentic high attenuation associated with the wall of the thoracic aorta to suggest acute intramural hematoma. No aneurysm or dissection of  the thoracic aorta. 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 Mid thoracic aortic arch measures 2.9 cm in diameter. Descending thoracic aorta measures 2.8 cm in diameter. Heart size is normal. There is no significant pericardial fluid, thickening or pericardial calcification. There is aortic atherosclerosis, as well as atherosclerosis of the great vessels of the mediastinum and the coronary arteries, including calcified atherosclerotic plaque in the left main, left anterior descending, left circumflex and right coronary arteries. Mediastinum/Nodes: Enlarged AP window lymph node measuring 1.7 cm in short axis. Borderline enlarged left hilar lymph nodes measuring up to 1.3 cm in short axis. Small hiatal hernia. No axillary lymphadenopathy. Lungs/Pleura: Multiple pulmonary nodules are noted, largest of which is in the left upper lobe (axial image 63 of series 7 and sagittal image 141 of series 10) measuring 2.5 x 1.6 x 1.5 cm. This lesion has macrolobulated slightly ill-defined margins. Several  other smaller pulmonary nodules are noted including a 7 x 4 mm left upper lobe nodule (axial image 65 of series 7), and a 1.0 x 0.5 cm nodule in the superior segment of the right lower lobe (axial image 44 of series 7). No acute consolidative airspace disease. No pleural effusions. Musculoskeletal: There are no aggressive appearing lytic or blastic lesions noted in the visualized portions of the skeleton. Review of the MIP images confirms the above findings. CTA ABDOMEN AND PELVIS FINDINGS VASCULAR Aorta: Normal caliber aorta without aneurysm, dissection, vasculitis or significant stenosis. Extensive atherosclerotic disease throughout the abdominal aorta. Celiac: Patent without evidence of dissection, vasculitis or significant stenosis. Mild ectasia of the proximal celiac axis which measures 8 mm in diameter. SMA: Patent without evidence of aneurysm, dissection, vasculitis or significant stenosis. Renals: Both  renal arteries are patent without evidence of aneurysm, dissection, vasculitis, fibromuscular dysplasia or significant stenosis. IMA: Patent without evidence of aneurysm, dissection, vasculitis or significant stenosis. Inflow: Patent without evidence of aneurysm, dissection, vasculitis or significant stenosis. Veins: No obvious venous abnormality within the limitations of this arterial phase study. Review of the MIP images confirms the above findings. NON-VASCULAR Hepatobiliary: 1.2 cm low-attenuation lesion in segment 2 of the liver, compatible with a simple cyst. No other larger more suspicious appearing pulmonary nodules or masses are noted. No intra or extrahepatic biliary ductal dilatation. Gallbladder is normal in appearance. Pancreas: No pancreatic mass. No pancreatic ductal dilatation. No pancreatic or peripancreatic fluid collections or inflammatory changes. Spleen: Unremarkable. Adrenals/Urinary Tract: Subcentimeter low-attenuation lesion in the posterior aspect of the interpolar region of the left kidney, too small to characterize, but statistically likely to represent a tiny cyst. Right kidney and bilateral adrenal glands are normal in appearance. No hydroureteronephrosis. Urinary bladder is normal in appearance. Stomach/Bowel: Normal appearance of the stomach. No pathologic dilatation of small bowel or colon. Numerous colonic diverticulae are noted, without surrounding inflammatory changes to suggest an acute diverticulitis at this time. The appendix is not confidently identified and may be surgically absent. Regardless, there are no inflammatory changes noted adjacent to the cecum to suggest the presence of an acute appendicitis at this time. Lymphatic: No lymphadenopathy noted in the abdomen or pelvis. Reproductive: Fiducial markers in the prostate gland. Other: No significant volume of ascites.  No pneumoperitoneum. Musculoskeletal: There are no aggressive appearing lytic or blastic lesions noted in  the visualized portions of the skeleton. Review of the MIP images confirms the above findings. IMPRESSION: 1. No acute findings are noted in the chest, abdomen or pelvis to account for the patient's history of abdominal pain. 2. Multiple pulmonary nodules in the lungs bilaterally, largest of which is a left upper lobe pulmonary nodule measuring 2.5 x 1.6 x 1.5 cm. This is associated with left hilar and AP window lymphadenopathy. 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 Further evaluation with PET-CT is recommended in the near future. No definite signs of metastatic disease in the abdomen or pelvis. 3. Colonic diverticulosis without evidence of acute diverticulitis at this time. 4. Aortic atherosclerosis, in addition to left main and 3 vessel coronary artery disease. Assessment for potential risk factor modification, dietary therapy or pharmacologic therapy may be warranted, if clinically indicated. 5. 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 Recommend annual imaging followup by CTA or MRA. This recommendation follows 2010 ACCF/AHA/AATS/ACR/ASA/SCA/SCAI/SIR/STS/SVM Guidelines for the Diagnosis and Management of Patients with Thoracic Aortic Disease. Circulation.  2010; 121: H419-F790. Aortic aneurysm NOS (ICD10-I71.9). 6. Additional incidental findings, as above. Electronically Signed   By: Vinnie Langton M.D.   On: 06/27/2019 12:26    ASSESSMENT & PLAN:  Malignant neoplasm of prostate ([place]) 1.  Prostate cancer: -History of prostate cancer diagnosed on 08/28/2014, Gleason 4+4= 8, status post radiation therapy and seed implants. -Lower back pain for the last several months, worse in the last 3 weeks leading to hospitalization. -PSA on admission on 06/19/2019 was 239, testosterone 210.  Alk phos elevated at 306. -MRI of the cervical, thoracic and lumbar spine on 06/27/2019 showed diffuse bone  metastatic disease.  Mild to moderate compression deformity of T3 with mild ventral epidural extension.  No cord compression.  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 -CT angio of the chest, abdomen and pelvis on 06/27/2019 showed multiple lung nodules bilaterally, largest in the left upper lobe measuring 2.5 x 1.6 x 1.5 cm with associated left hilar and AP window lymphadenopathy. -Navigational bronchoscopy and biopsy on 07/02/2019 with left upper lobe brushing and washing showing atypical cells.  10 L FNA also showing atypical cells. -He denied any weight loss or GI symptoms. -He smoked a pack per day for 4-5 years and quit 60 years ago.  He is a [place] [employer] veteran and had agent orange exposure. -We will give him Lupron 45 mg intramuscularly as soon as we get insurance authorization. -As the findings are concerning for primary bronchogenic carcinoma, we'll obtain a PET CT scan to identify the area for biopsy.  2.  Lower back pain: -Reports lower back pain radiating down the posterior right thigh.  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 -10 treatments of radiation therapy completed on 07/11/2019.  Did not notice any improvement in the pain yet. -He is taking Dilaudid 4 mg every 4 hours even during the nighttime.  He was told to take stool softener with it.  3.  Right leg swelling: -He has no distal swelling of the right ankle for last 1 to 2 days.  Denies any pain.  No pleuritic chest pain. -We will send him for a Doppler.  If positive for DVT, will start him on DOAC.  4.  Urinary retention: -He has an indwelling Foley catheter.  Orders Placed This Encounter  Procedures  . US Venous Img Lower Unilateral Right    No auth needed per Loretha Brasil ok per Denice Paradise in u/s dept    Standing Status:    Future    Standing Expiration Date:   07/11/2020    Order Specific Question:   Reason for Exam (SYMPTOM  OR DIAGNOSIS REQUIRED)    Answer:   ricght leg swelling    Order Specific Question:   Preferred imaging location?    Answer:   [facility]    Order Specific Question:   Call Results- Best Contact Number?    Answer:   [phone] /  do not hold patient  . NM PET Image Initial (PI) Skull Base To Thigh    Standing Status:   Future    Standing Expiration Date:   07/11/2020    Order Specific Question:   ** REASON FOR EXAM (FREE TEXT)    Answer:   Hx prostate cancer, lung nodules, and adenopathy    Order Specific Question:   If indicated for  the ordered procedure, I authorize the administration of a radiopharmaceutical per Radiology protocol    Answer:   Yes    Order Specific Question:   Preferred imaging location?    Answer:   [facility]    Order Specific Question:   Radiology Contrast Protocol - do NOT remove file path    Answer:   [email or web address]    All questions were answered. The patient knows to call the clinic with any problems, questions or concerns.     Derek Jack, MD 07/12/2019 2:52 PM

## 2019-07-12 NOTE — Patient Instructions (Signed)
[place] at [facility] Discharge Instructions  Received Eligard injection today. Follow-up as scheduled. Call clinic for any questions or concerns   Thank you for choosing [place] at [facility] to provide your oncology and hematology care.  To afford each patient quality time with our provider, please arrive at least 15 minutes before your scheduled appointment time.   If you have a lab appointment with the [place] please come in thru the Main Entrance and check in at the main information desk.  You need to re-schedule your appointment should you arrive 10 or more minutes late.  We strive to give you quality time with our providers, and arriving late affects you and other patients whose appointments are after yours.  Also, if you no show three or more times for appointments you may be dismissed from the clinic at the providers discretion.     Again, thank you for choosing [facility].  Our hope is that these requests will decrease the amount of time that you wait before being seen by our physicians.       _____________________________________________________________  Should you have questions after your visit to [facility], please contact our office at [phone] between the hours of 8:00 a.m. and 4:30 p.m.  Voicemails left after 4:00 p.m. will not be returned until the following business day.  For prescription refill requests, have your pharmacy contact our office and allow 72 hours.    Due to Covid, you will need to wear a mask upon entering the hospital. If you do not have a mask, a mask will be given to you at the Main Entrance upon arrival. For doctor visits, patients may have 1 support person with them. For treatment visits, patients can not have anyone with them due to social distancing guidelines and our immunocompromised population.

## 2019-07-12 NOTE — Telephone Encounter (Signed)
-----   Message from Tyler Pita, MD sent at 07/11/2019  4:01 PM EDT ----- Regarding: RE: Treating Additional Areas ?  If he has 6/10 or more pain in other areas, and it is continuous day and night, and it is not easily controlled with prn meds . . . then sure.  ----- Message ----- From: Heywood Footman, RN Sent: 07/11/2019  12:10 PM EDT To: Tyler Pita, MD, Freeman Caldron, PA-C Subject: Treating Additional Areas                      Dr. Tammi Klippel.  Mr. France completed 10/10 radiation treatments to his lumbar spine today. He was good in his block so he did not come around for PUT. He did request I ask you if you are planning to treat any other areas. I told him I would call him back tomorrow with your response.   Thanks. Sam

## 2019-07-12 NOTE — Telephone Encounter (Signed)
Phoned patient's home. Patient at appointment with Dr. Delray Alt. Will phone patient back later this afternoon to inquire about pain and discuss need for further treatment.

## 2019-07-13 ENCOUNTER — Ambulatory Visit (HOSPITAL_COMMUNITY): Payer: Medicare Other

## 2019-07-13 ENCOUNTER — Telehealth: Payer: Self-pay | Admitting: Radiation Oncology

## 2019-07-13 DIAGNOSIS — C7951 Secondary malignant neoplasm of bone: Secondary | ICD-10-CM | POA: Diagnosis not present

## 2019-07-13 DIAGNOSIS — C3491 Malignant neoplasm of unspecified part of right bronchus or lung: Secondary | ICD-10-CM | POA: Diagnosis not present

## 2019-07-13 DIAGNOSIS — B370 Candidal stomatitis: Secondary | ICD-10-CM | POA: Diagnosis not present

## 2019-07-13 DIAGNOSIS — I25110 Atherosclerotic heart disease of native coronary artery with unstable angina pectoris: Secondary | ICD-10-CM | POA: Diagnosis not present

## 2019-07-13 DIAGNOSIS — M8458XD Pathological fracture in neoplastic disease, other specified site, subsequent encounter for fracture with routine healing: Secondary | ICD-10-CM | POA: Diagnosis not present

## 2019-07-13 NOTE — Telephone Encounter (Signed)
Phoned patient to inquire about need for more radiation therapy. Patient working with PT. Young lady took my name and number down. She committed to asking the patient to return my call. Awaiting return call.

## 2019-07-13 NOTE — Telephone Encounter (Signed)
-----   Message from Tyler Pita, MD sent at 07/11/2019  4:01 PM EDT ----- Regarding: RE: Treating Additional Areas ?  If he has 6/10 or more pain in other areas, and it is continuous day and night, and it is not easily controlled with prn meds . . . then sure.  ----- Message ----- From: Heywood Footman, RN Sent: 07/11/2019  12:10 PM EDT To: Tyler Pita, MD, Freeman Caldron, PA-C Subject: Treating Additional Areas                      Dr. Tammi Klippel.  Mr. Pokorny completed 10/10 radiation treatments to his lumbar spine today. He was good in his block so he did not come around for PUT. He did request I ask you if you are planning to treat any other areas. I told him I would call him back tomorrow with your response.   Thanks. Sam

## 2019-07-13 NOTE — Telephone Encounter (Signed)
-----   Message from Tyler Pita, MD sent at 07/11/2019  4:01 PM EDT ----- Regarding: RE: Treating Additional Areas ?  If he has 6/10 or more pain in other areas, and it is continuous day and night, and it is not easily controlled with prn meds . . . then sure.  ----- Message ----- From: Heywood Footman, RN Sent: 07/11/2019  12:10 PM EDT To: Tyler Pita, MD, Freeman Caldron, PA-C Subject: Treating Additional Areas                      Dr. Tammi Klippel.  [name] completed 10/10 radiation treatments to his lumbar spine today. He was good in his block so he did not come around for PUT. He did request I ask you if you are planning to treat any other areas. I told him I would call him back tomorrow with your response.   Thanks. Sam

## 2019-07-13 NOTE — Telephone Encounter (Signed)
Patient returned my call. Explained that Dr. Tammi Klippel is willing to look into giving more radiation therapy to any areas of pain 6 or above not managed by pain medication. Patient confirms the Dilaudid every four hours is holding him for now. Reminded him the radiation he just received is still working in his body and improvement could still be seen. Patient agreed to follow up phone call from this RN in two weeks to check status.

## 2019-07-17 DIAGNOSIS — C7951 Secondary malignant neoplasm of bone: Secondary | ICD-10-CM | POA: Diagnosis not present

## 2019-07-17 DIAGNOSIS — M8458XD Pathological fracture in neoplastic disease, other specified site, subsequent encounter for fracture with routine healing: Secondary | ICD-10-CM | POA: Diagnosis not present

## 2019-07-17 DIAGNOSIS — I25110 Atherosclerotic heart disease of native coronary artery with unstable angina pectoris: Secondary | ICD-10-CM | POA: Diagnosis not present

## 2019-07-17 DIAGNOSIS — C3491 Malignant neoplasm of unspecified part of right bronchus or lung: Secondary | ICD-10-CM | POA: Diagnosis not present

## 2019-07-18 ENCOUNTER — Encounter: Payer: Self-pay | Admitting: Urology

## 2019-07-18 ENCOUNTER — Other Ambulatory Visit: Payer: Self-pay | Admitting: Urology

## 2019-07-18 ENCOUNTER — Other Ambulatory Visit: Payer: Self-pay

## 2019-07-18 ENCOUNTER — Ambulatory Visit: Payer: Medicare Other | Admitting: Urology

## 2019-07-18 VITALS — BP 156/70 | HR 81 | Temp 99.1°F | Ht 68.0 in | Wt 145.0 lb

## 2019-07-18 DIAGNOSIS — C7951 Secondary malignant neoplasm of bone: Secondary | ICD-10-CM | POA: Diagnosis not present

## 2019-07-18 DIAGNOSIS — R339 Retention of urine, unspecified: Secondary | ICD-10-CM | POA: Diagnosis not present

## 2019-07-18 DIAGNOSIS — C61 Malignant neoplasm of prostate: Secondary | ICD-10-CM

## 2019-07-18 NOTE — Patient Instructions (Addendum)
Prostate Cancer  The prostate is a walnut-sized gland that is involved in the production of semen. It is located below a man's bladder, in front of the rectum. Prostate cancer is the abnormal growth of cells in the prostate gland. What are the causes? The exact cause of this condition is not known. What increases the risk? This condition is more likely to develop in men who:  Are older than age 77.  Are African-American.  Are obese.  Have a family history of prostate cancer.  Have a family history of breast cancer. What are the signs or symptoms? Symptoms of this condition include:  A need to urinate often.  Weak or interrupted flow of urine.  Trouble starting or stopping urination.  Inability to urinate.  Pain or burning during urination.  Painful ejaculation.  Blood in urine or semen.  Persistent pain or discomfort in the lower back, lower abdomen, hips, or upper thighs.  Trouble getting an erection.  Trouble emptying the bladder all the way. How is this diagnosed? This condition can be diagnosed with:  A digital rectal exam. For this exam, a health care provider inserts a gloved finger into the rectum to feel the prostate gland.  A blood test called a prostate-specific antigen (PSA) test.  An imaging test called transrectal ultrasonography.  A procedure in which a sample of tissue is taken from the prostate and examined under a microscope (prostate biopsy). Once the condition is diagnosed, tests will be done to determine how far the cancer has spread. This is called staging the cancer. Staging may involve imaging tests, such as:  A bone scan.  A CT scan.  A PET scan.  An MRI. The stages of prostate cancer are as follows:  Stage I. At this stage, the cancer is found in the prostate only. The cancer is not visible on imaging tests and it is usually found by accident, such as during a prostate surgery.  Stage II. At this stage, the cancer is more advanced  than it is in stage I, but the cancer has not spread outside the prostate.  Stage III. At this stage, the cancer has spread beyond the outer layer of the prostate to nearby tissues. The cancer may be found in the seminal vesicles, which are near the bladder and the prostate.  Stage IV. At this stage, the cancer has spread other parts of the body, such as the lymph nodes, bones, bladder, rectum, liver, or lungs. How is this treated? Treatment for this condition depends on several factors, including the stage of the cancer, your age, personal preferences, and your overall health. Talk with your health care provider about treatment options that are recommended for you. Common treatments include:  Observation for early stage prostate cancer (active surveillance). This involves having exams, blood tests, and in some cases, more biopsies. For some men, this is the only treatment needed.  Surgery. Types of surgeries include: ? Open surgery. In this surgery, a larger incision is made to remove the prostate. ? A laparoscopic prostatectomy. This is a surgery to remove the prostate and lymph nodes through several, small incisions. It is often referred to as a minimally invasive surgery. ? A robotic prostatectomy. This is a surgery to remove the prostate and lymph nodes with the help of a robotic arm that is controlled by a computer. ? Orchiectomy. This is a surgery to remove the testicles. ? Cryosurgery. This is a surgery to freeze and destroy cancer cells.  Radiation treatment. Types   of radiation treatment include: ? External beam radiation. This type aims beams of radiation from outside the body at the prostate to destroy cancerous cells. ? Brachytherapy. This type uses radioactive needles, seeds, wires, or tubes that are implanted into the prostate gland. Like external beam radiation, brachytherapy destroys cancerous cells. An advantage is that this type of radiation limits the damage to surrounding  tissue and has fewer side effects.  High-intensity, focused ultrasonography. This treatment destroys cancer cells by delivering high-energy ultrasound waves to the cancerous cells.  Chemotherapy medicines. This treatment kills cancer cells or stops them from multiplying.  Hormone treatment. This treatment involves taking medicines that act on one of the male hormones (testosterone): ? By stopping your body from producing testosterone. ? By blocking testosterone from reaching cancer cells. Follow these instructions at home:  Take over-the-counter and prescription medicines only as told by your health care provider.  Maintain a healthy diet.  Get plenty of sleep.  Consider joining a support group for men who have prostate cancer. Meeting with a support group may help you learn to cope with the stress of having cancer.  Keep all follow-up visits as told by your health care provider. This is important.  If you have to go to the hospital, notify your cancer specialist (oncologist).  Treatment for prostate cancer may affect sexual function. Continue to have intimate moments with your partner. This may include touching, holding, hugging, and caressing. Contact a health care provider if:  You have trouble urinating.  You have blood in your urine.  You have pain in your hips, back, or chest. Get help right away if:  You have weakness or numbness in your legs.  You cannot control urination or your bowel movements (incontinence).  You have trouble breathing.  You have sudden chest pain.  You have chills or a fever. Summary  The prostate is a walnut-sized gland that is involved in the production of semen. It is located below a man's bladder, in front of the rectum. Prostate cancer is the abnormal growth of cells in the prostate gland.  Treatment for this condition depends on several factors, including the stage of the cancer, your age, personal preferences, and your overall health.  Talk with your health care provider about treatment options that are recommended for you.  Consider joining a support group for men who have prostate cancer. Meeting with a support group may help you learn to cope with the stress of having cancer. This information is not intended to replace advice given to you by your health care provider. Make sure you discuss any questions you have with your health care provider. Document Revised: 01/28/2017 Document Reviewed: 10/27/2015 Elsevier Patient Education  2020 [employer] Inc.  

## 2019-07-18 NOTE — Progress Notes (Signed)
Urological Symptom Review  Patient is experiencing the following symptoms: Pt. Has cath   Review of Systems  Gastrointestinal (upper)  : Negative for upper GI symptoms  Gastrointestinal (lower) : Negative for lower GI symptoms  Constitutional : Negative for symptoms  Skin: Negative for skin symptoms  Eyes: Negative for eye symptoms  Ear/Nose/Throat : Negative for Ear/Nose/Throat symptoms  Hematologic/Lymphatic: Negative for Hematologic/Lymphatic symptoms  Cardiovascular : Negative for cardiovascular symptoms  Respiratory : Negative for respiratory symptoms  Endocrine: Negative for endocrine symptoms  Musculoskeletal: Negative for musculoskeletal symptoms  Neurological: Negative for neurological symptoms  Psychologic: Negative for psychiatric symptoms

## 2019-07-18 NOTE — Progress Notes (Signed)
07/18/2019 11:23 AM   Tayquan Toniann Fail. January 19, 1941 [id number]  Referring provider: Dettinger, Fransisca Kaufmann, MD [place],   [id number]  Prostate cancer and urinary retention  HPI: Mr Keith Olson is a 79yo here for followup for metastatic prostate cancer and urinary retention. 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 PSA 239. He was given a eligard 16m injection which exacerbated his pain. CT scan from 4/28 showed multiple soft tissue nodules and diffuse sclerotic bone mets.  When he was admitted to the hospital he devloped a weaker stream and had a foley placed.      PMH: Past Medical History:  Diagnosis Date  . Agent orange exposure   . Arthritis   . Coronary artery disease    BMS to mid LAD 2001, DES to proximal LAD 2017  . Enlarged prostate    XRT 2016  . Headache    Sinus headaches   . Heart abnormality   . History of depression   . Hyperlipidemia   . Hypothyroidism   . [place]   hormonal therapy, external beam radiation therapy  . PTSD (post-traumatic stress disorder)     Surgical History: Past Surgical History:  Procedure Laterality Date  . BRONCHIAL BRUSHINGS  07/02/2019   Procedure: BRONCHIAL BRUSHINGS;  Surgeon: ARigoberto Noel MD;  [facility];  Service: Cardiopulmonary;;  . BRONCHIAL NEEDLE ASPIRATION BIOPSY  07/02/2019   Procedure: BRONCHIAL NEEDLE ASPIRATION BIOPSIES;  Surgeon: ARigoberto Noel MD;  [facility];  Service: Cardiopulmonary;;  . BRONCHIAL WASHINGS  07/02/2019   Procedure: BRONCHIAL WASHINGS;  Surgeon: ARigoberto Noel MD;  [facility];  Service: Cardiopulmonary;;  . CARDIAC CATHETERIZATION N/A 12/12/2015   Procedure: Left Heart Cath and Coronary Angiography;  Surgeon: Peter M JMartinique MD;  [place];  Service: Cardiovascular;  Laterality: N/A;  . CARDIAC CATHETERIZATION N/A 12/12/2015   Procedure: Coronary Stent  Intervention;  Surgeon: Peter M JMartinique MD;  [place];  Service: Cardiovascular;  Laterality: N/A;  . CORONARY STENT INTERVENTION N/A 09/06/2017   Procedure: CORONARY STENT INTERVENTION;  Surgeon: MBurnell Blanks MD;  [place] [facility];  Service: Cardiovascular;  Laterality: N/A;  . ENDOBRONCHIAL ULTRASOUND N/A 07/02/2019   Procedure: ENDOBRONCHIAL ULTRASOUND;  Surgeon: ARigoberto Noel MD;  [facility];  Service: Cardiopulmonary;  Laterality: N/A;  . FLEXIBLE BRONCHOSCOPY  07/02/2019   Procedure: FLEXIBLE BRONCHOSCOPY;  Surgeon: ARigoberto Noel MD;  [facility];  Service: Cardiopulmonary;;  . HERNIA REPAIR Right   . LEFT HEART CATH AND CORONARY ANGIOGRAPHY N/A 09/02/2016   Procedure: Left Heart Cath and Coronary Angiography;  Surgeon: HLeonie Man MD;  [place];  Service: Cardiovascular;  Laterality: N/A;  . LEFT HEART CATH AND CORONARY ANGIOGRAPHY N/A 09/06/2017   Procedure: LEFT HEART CATH AND CORONARY ANGIOGRAPHY;  Surgeon: MBurnell Blanks MD;  [facility];  Service: Cardiovascular;  Laterality: N/A;  . LEFT HEART CATH AND CORONARY ANGIOGRAPHY N/A 05/04/2019   Procedure: LEFT HEART CATH AND CORONARY ANGIOGRAPHY;  Surgeon: JMartinique Peter M, MD;  [facility];  Service: Cardiovascular;  Laterality: N/A;  . PROSTATE BIOPSY N/A 08/28/2014   Procedure: BIOPSY TRANSRECTAL ULTRASONIC PROSTATE (TUBP);  Surgeon: DRana Snare MD;  [facility];  Service: Urology;  Laterality: N/A;  . TRANSURETHRAL RESECTION OF PROSTATE N/A 08/28/2014  Procedure: TRANSURETHRAL RESECTION OF THE PROSTATE WITH GYRUS INSTRUMENTS;  Surgeon: Rana Snare, MD;  [facility];  Service: Urology;  Laterality: N/A;    Home Medications:  Allergies as of 07/18/2019      Reactions   Lipitor [atorvastatin]    Muscles aches      Medication List       Accurate as of Jul 18, 2019 11:23 AM. If you have any  questions, ask your nurse or doctor.        ALPRAZolam 0.5 MG tablet Commonly known as: XANAX Take 0.5 mg by mouth 2 (two) times daily as needed.   aspirin EC 81 MG tablet Take 1 tablet (81 mg total) by mouth daily.   bicalutamide 50 MG tablet Commonly known as: CASODEX Take 1 tablet (50 mg total) by mouth daily.   dexamethasone 4 MG tablet Commonly known as: DECADRON Take 1 tablet (4 mg total) by mouth 2 (two) times daily with a meal.   diclofenac sodium 1 % Gel Commonly known as: VOLTAREN APPLY 2 GRAMS TOPICALLY 4 TIMES DAILY What changed: See the new instructions.   fentaNYL 25 MCG/HR Commonly known as: [place] 1 patch onto the skin every 3 (three) days.   gabapentin 400 MG capsule Commonly known as: NEURONTIN Take 1 capsule (400 mg total) by mouth 3 (three) times daily.   HYDROmorphone 4 MG tablet Commonly known as: DILAUDID Take 1 tablet (4 mg total) by mouth every 4 (four) hours as needed for moderate pain or severe pain.   levothyroxine 75 MCG tablet Commonly known as: SYNTHROID Take 1 tablet (75 mcg total) by mouth daily before breakfast.   lidocaine 5 % Commonly known as: LIDODERM Place 1 patch onto the skin daily. Remove & Discard patch within 12 hours or as directed by MD   meclizine 25 MG tablet Commonly known as: ANTIVERT Take 1 tablet (25 mg total) by mouth 2 (two) times daily as needed for dizziness.   nitroGLYCERIN 0.4 MG SL tablet Commonly known as: NITROSTAT Place 1 tablet (0.4 mg total) under the tongue every 5 (five) minutes as needed for chest pain. X 3 doses What changed: additional instructions   pantoprazole 40 MG tablet Commonly known as: PROTONIX Take 1 tablet (40 mg total) by mouth daily.   polyethylene glycol 17 g packet Commonly known as: MIRALAX / GLYCOLAX Take 17 g by mouth daily.   rosuvastatin 40 MG tablet Commonly known as: CRESTOR Take 1 tablet (40 mg total) by mouth daily.   senna-docusate 8.6-50 MG tablet  Commonly known as: Senokot S Take 2 tablets by mouth 2 (two) times daily.   SUMAtriptan 25 MG tablet Commonly known as: IMITREX TAKE 1 TABLET BY MOUTH EVERY 2 HOURS AS NEEDED FOR MIGRAINE. MAY REPEAT IN 2 HOURS IF HEADACHE PERSISTS OR RECURS What changed:   how much to take  how to take this  when to take this  reasons to take this   tamsulosin 0.4 MG Caps capsule Commonly known as: FLOMAX Take 0.4 mg by mouth at bedtime.       Allergies:  Allergies  Allergen Reactions  . Lipitor [Atorvastatin]     Muscles aches    Family History: Family History  Problem Relation Age of Onset  . Arthritis Mother   . Heart attack Father     Social History:  reports that he has never smoked. He has quit using smokeless tobacco.  His smokeless tobacco use included chew. He reports that he does  not drink alcohol or use drugs.  ROS: All other review of systems were reviewed and are negative except what is noted above in HPI  Physical Exam: BP (!) 156/70   Pulse 81   Temp 99.1 F (37.3 C)   Ht _0  (1.727 m)   Wt 145 lb (65.8 kg)   BMI 22.05 kg/m   Constitutional:  Alert and oriented, No acute distress. HEENT: [place] AT, moist mucus membranes.  Trachea midline, no masses. Cardiovascular: No clubbing, cyanosis, or edema. Respiratory: Normal respiratory effort, no increased work of breathing. GI: Abdomen is soft, nontender, nondistended, no abdominal masses GU: No CVA tenderness.  Lymph: No cervical or inguinal lymphadenopathy. Skin: No rashes, bruises or suspicious lesions. Neurologic: Grossly intact, no focal deficits, moving all 4 extremities. Psychiatric: Normal mood and affect.  Laboratory Data: Lab Results  Component Value Date   WBC 16.8 (H) 07/06/2019   HGB 13.9 07/06/2019   HCT 42.8 07/06/2019   MCV 95.5 07/06/2019   PLT 287 07/06/2019    Lab Results  Component Value Date   CREATININE 0.58 (L) 07/06/2019    No results found for: PSA  Lab Results   Component Value Date   TESTOSTERONE 210 (L) 06/28/2019    Lab Results  Component Value Date   HGBA1C 5.7 (H) 05/03/2019    Urinalysis    Component Value Date/Time   COLORURINE YELLOW 06/27/2019 1756   APPEARANCEUR CLEAR 06/27/2019 1756   APPEARANCEUR Clear 05/22/2018 1547   LABSPEC 1.043 (H) 06/27/2019 1756   PHURINE 6.0 06/27/2019 1756   GLUCOSEU NEGATIVE 06/27/2019 1756   HGBUR NEGATIVE 06/27/2019 1756   BILIRUBINUR NEGATIVE 06/27/2019 1756   BILIRUBINUR Negative 05/22/2018 1547   KETONESUR 5 (A) 06/27/2019 1756   PROTEINUR NEGATIVE 06/27/2019 1756   UROBILINOGEN negative 03/07/2014 1059   NITRITE NEGATIVE 06/27/2019 1756   LEUKOCYTESUR NEGATIVE 06/27/2019 1756    Lab Results  Component Value Date   LABMICR See below: 05/22/2018   WBCUA None seen 05/22/2018   RBCUA None seen 05/22/2018   LABEPIT None seen 05/22/2018   MUCUS Present 11/26/2016   BACTERIA None seen 05/22/2018    Pertinent Imaging: CT 4/28: images reviewed and disucssed with the patient No results found for this or any previous visit. No results found for this or any previous visit. No results found for this or any previous visit. No results found for this or any previous visit. No results found for this or any previous visit. No results found for this or any previous visit. No results found for this or any previous visit. No results found for this or any previous visit.  Assessment & Plan:    1. Urinary retention -We discussed voiding trial today and the patient defers at this times  2. Prostate cancer metastatic to bone ([place]) -testosterone today. If testosterone is not castrate we will get him back for firmagon 258m. The patient will get dental clearance to start xgeva.   No follow-ups on file.  PNicolette Bang MD  [facility]

## 2019-07-19 DIAGNOSIS — C3491 Malignant neoplasm of unspecified part of right bronchus or lung: Secondary | ICD-10-CM | POA: Diagnosis not present

## 2019-07-19 DIAGNOSIS — C7951 Secondary malignant neoplasm of bone: Secondary | ICD-10-CM | POA: Diagnosis not present

## 2019-07-19 DIAGNOSIS — I25110 Atherosclerotic heart disease of native coronary artery with unstable angina pectoris: Secondary | ICD-10-CM | POA: Diagnosis not present

## 2019-07-19 DIAGNOSIS — M8458XD Pathological fracture in neoplastic disease, other specified site, subsequent encounter for fracture with routine healing: Secondary | ICD-10-CM | POA: Diagnosis not present

## 2019-07-19 LAB — TESTOSTERONE: Testosterone: 16 ng/dL — ABNORMAL LOW (ref 250–827)

## 2019-07-20 ENCOUNTER — Encounter (HOSPITAL_COMMUNITY): Payer: Self-pay | Admitting: Emergency Medicine

## 2019-07-20 ENCOUNTER — Other Ambulatory Visit (HOSPITAL_COMMUNITY): Payer: Self-pay | Admitting: Hematology

## 2019-07-20 ENCOUNTER — Other Ambulatory Visit: Payer: Self-pay | Admitting: Radiation Therapy

## 2019-07-20 ENCOUNTER — Emergency Department (HOSPITAL_COMMUNITY)
Admission: EM | Admit: 2019-07-20 | Discharge: 2019-07-21 | Disposition: A | Discharge status: Home / Self Care | Payer: Medicare Other | Source: Home / Self Care | Attending: Emergency Medicine | Admitting: Emergency Medicine

## 2019-07-20 ENCOUNTER — Emergency Department (HOSPITAL_COMMUNITY): Payer: Medicare Other

## 2019-07-20 ENCOUNTER — Telehealth: Payer: Self-pay | Admitting: Radiation Oncology

## 2019-07-20 ENCOUNTER — Other Ambulatory Visit: Payer: Self-pay

## 2019-07-20 DIAGNOSIS — Z743 Need for continuous supervision: Secondary | ICD-10-CM | POA: Diagnosis not present

## 2019-07-20 DIAGNOSIS — R748 Abnormal levels of other serum enzymes: Secondary | ICD-10-CM | POA: Diagnosis not present

## 2019-07-20 DIAGNOSIS — Z7952 Long term (current) use of systemic steroids: Secondary | ICD-10-CM | POA: Diagnosis not present

## 2019-07-20 DIAGNOSIS — I2510 Atherosclerotic heart disease of native coronary artery without angina pectoris: Secondary | ICD-10-CM | POA: Insufficient documentation

## 2019-07-20 DIAGNOSIS — Z66 Do not resuscitate: Secondary | ICD-10-CM | POA: Diagnosis not present

## 2019-07-20 DIAGNOSIS — E871 Hypo-osmolality and hyponatremia: Secondary | ICD-10-CM | POA: Diagnosis not present

## 2019-07-20 DIAGNOSIS — E039 Hypothyroidism, unspecified: Secondary | ICD-10-CM | POA: Insufficient documentation

## 2019-07-20 DIAGNOSIS — Z5111 Encounter for antineoplastic chemotherapy: Secondary | ICD-10-CM | POA: Diagnosis not present

## 2019-07-20 DIAGNOSIS — R0602 Shortness of breath: Secondary | ICD-10-CM | POA: Insufficient documentation

## 2019-07-20 DIAGNOSIS — N390 Urinary tract infection, site not specified: Secondary | ICD-10-CM | POA: Diagnosis not present

## 2019-07-20 DIAGNOSIS — I10 Essential (primary) hypertension: Secondary | ICD-10-CM | POA: Diagnosis not present

## 2019-07-20 DIAGNOSIS — I25110 Atherosclerotic heart disease of native coronary artery with unstable angina pectoris: Secondary | ICD-10-CM | POA: Diagnosis not present

## 2019-07-20 DIAGNOSIS — Z7982 Long term (current) use of aspirin: Secondary | ICD-10-CM | POA: Insufficient documentation

## 2019-07-20 DIAGNOSIS — R0689 Other abnormalities of breathing: Secondary | ICD-10-CM | POA: Diagnosis not present

## 2019-07-20 DIAGNOSIS — R531 Weakness: Secondary | ICD-10-CM | POA: Diagnosis not present

## 2019-07-20 DIAGNOSIS — Z955 Presence of coronary angioplasty implant and graft: Secondary | ICD-10-CM | POA: Diagnosis not present

## 2019-07-20 DIAGNOSIS — Z888 Allergy status to other drugs, medicaments and biological substances status: Secondary | ICD-10-CM | POA: Diagnosis not present

## 2019-07-20 DIAGNOSIS — T83511A Infection and inflammatory reaction due to indwelling urethral catheter, initial encounter: Secondary | ICD-10-CM | POA: Diagnosis not present

## 2019-07-20 DIAGNOSIS — C7951 Secondary malignant neoplasm of bone: Secondary | ICD-10-CM | POA: Diagnosis not present

## 2019-07-20 DIAGNOSIS — M7989 Other specified soft tissue disorders: Secondary | ICD-10-CM | POA: Diagnosis not present

## 2019-07-20 DIAGNOSIS — E441 Mild protein-calorie malnutrition: Secondary | ICD-10-CM | POA: Diagnosis not present

## 2019-07-20 DIAGNOSIS — Z20822 Contact with and (suspected) exposure to covid-19: Secondary | ICD-10-CM | POA: Diagnosis not present

## 2019-07-20 DIAGNOSIS — Z6822 Body mass index (BMI) 22.0-22.9, adult: Secondary | ICD-10-CM | POA: Diagnosis not present

## 2019-07-20 DIAGNOSIS — R338 Other retention of urine: Secondary | ICD-10-CM | POA: Diagnosis not present

## 2019-07-20 DIAGNOSIS — R069 Unspecified abnormalities of breathing: Secondary | ICD-10-CM | POA: Diagnosis not present

## 2019-07-20 DIAGNOSIS — Z79899 Other long term (current) drug therapy: Secondary | ICD-10-CM | POA: Insufficient documentation

## 2019-07-20 DIAGNOSIS — C7800 Secondary malignant neoplasm of unspecified lung: Secondary | ICD-10-CM | POA: Diagnosis not present

## 2019-07-20 DIAGNOSIS — R300 Dysuria: Secondary | ICD-10-CM | POA: Diagnosis not present

## 2019-07-20 DIAGNOSIS — N138 Other obstructive and reflux uropathy: Secondary | ICD-10-CM | POA: Diagnosis not present

## 2019-07-20 DIAGNOSIS — E785 Hyperlipidemia, unspecified: Secondary | ICD-10-CM | POA: Diagnosis not present

## 2019-07-20 LAB — CBC WITH DIFFERENTIAL/PLATELET
Abs Immature Granulocytes: 0.14 10*3/uL — ABNORMAL HIGH (ref 0.00–0.07)
Basophils Absolute: 0 10*3/uL (ref 0.0–0.1)
Basophils Relative: 1 %
Eosinophils Absolute: 0.2 10*3/uL (ref 0.0–0.5)
Eosinophils Relative: 3 %
HCT: 31.9 % — ABNORMAL LOW (ref 39.0–52.0)
Hemoglobin: 10.6 g/dL — ABNORMAL LOW (ref 13.0–17.0)
Immature Granulocytes: 2 %
Lymphocytes Relative: 10 %
Lymphs Abs: 0.8 10*3/uL (ref 0.7–4.0)
MCH: 30.8 pg (ref 26.0–34.0)
MCHC: 33.2 g/dL (ref 30.0–36.0)
MCV: 92.7 fL (ref 80.0–100.0)
Monocytes Absolute: 0.5 10*3/uL (ref 0.1–1.0)
Monocytes Relative: 6 %
Neutro Abs: 6.3 10*3/uL (ref 1.7–7.7)
Neutrophils Relative %: 78 %
Platelets: 250 10*3/uL (ref 150–400)
RBC: 3.44 MIL/uL — ABNORMAL LOW (ref 4.22–5.81)
RDW: 14.7 % (ref 11.5–15.5)
WBC: 7.9 10*3/uL (ref 4.0–10.5)
nRBC: 0 % (ref 0.0–0.2)

## 2019-07-20 LAB — COMPREHENSIVE METABOLIC PANEL
ALT: 61 U/L — ABNORMAL HIGH (ref 0–44)
AST: 33 U/L (ref 15–41)
Albumin: 2.5 g/dL — ABNORMAL LOW (ref 3.5–5.0)
Alkaline Phosphatase: 374 U/L — ABNORMAL HIGH (ref 38–126)
Anion gap: 9 (ref 5–15)
BUN: 9 mg/dL (ref 8–23)
CO2: 25 mmol/L (ref 22–32)
Calcium: 7.4 mg/dL — ABNORMAL LOW (ref 8.9–10.3)
Chloride: 91 mmol/L — ABNORMAL LOW (ref 98–111)
Creatinine, Ser: 0.41 mg/dL — ABNORMAL LOW (ref 0.61–1.24)
GFR calc Af Amer: >60 mL/min (ref >60)
GFR calc non Af Amer: >60 mL/min (ref >60)
Glucose, Bld: 119 mg/dL — ABNORMAL HIGH (ref 70–99)
Potassium: 3.5 mmol/L (ref 3.5–5.1)
Sodium: 125 mmol/L — ABNORMAL LOW (ref 135–145)
Total Bilirubin: 0.8 mg/dL (ref 0.3–1.2)
Total Protein: 5.9 g/dL — ABNORMAL LOW (ref 6.5–8.1)

## 2019-07-20 LAB — TROPONIN I (HIGH SENSITIVITY): Troponin I (High Sensitivity): 7 ng/L (ref <18)

## 2019-07-20 IMAGING — DX DG CHEST 1V PORT
1 series · 1 of 1 positions shown · non-contrast
Comparison: [DATE], [DATE]

CLINICAL DATA: Short of breath since yesterday, weakness, history
of prostate cancer

EXAM:
PORTABLE CHEST 1 VIEW

[chest ap]
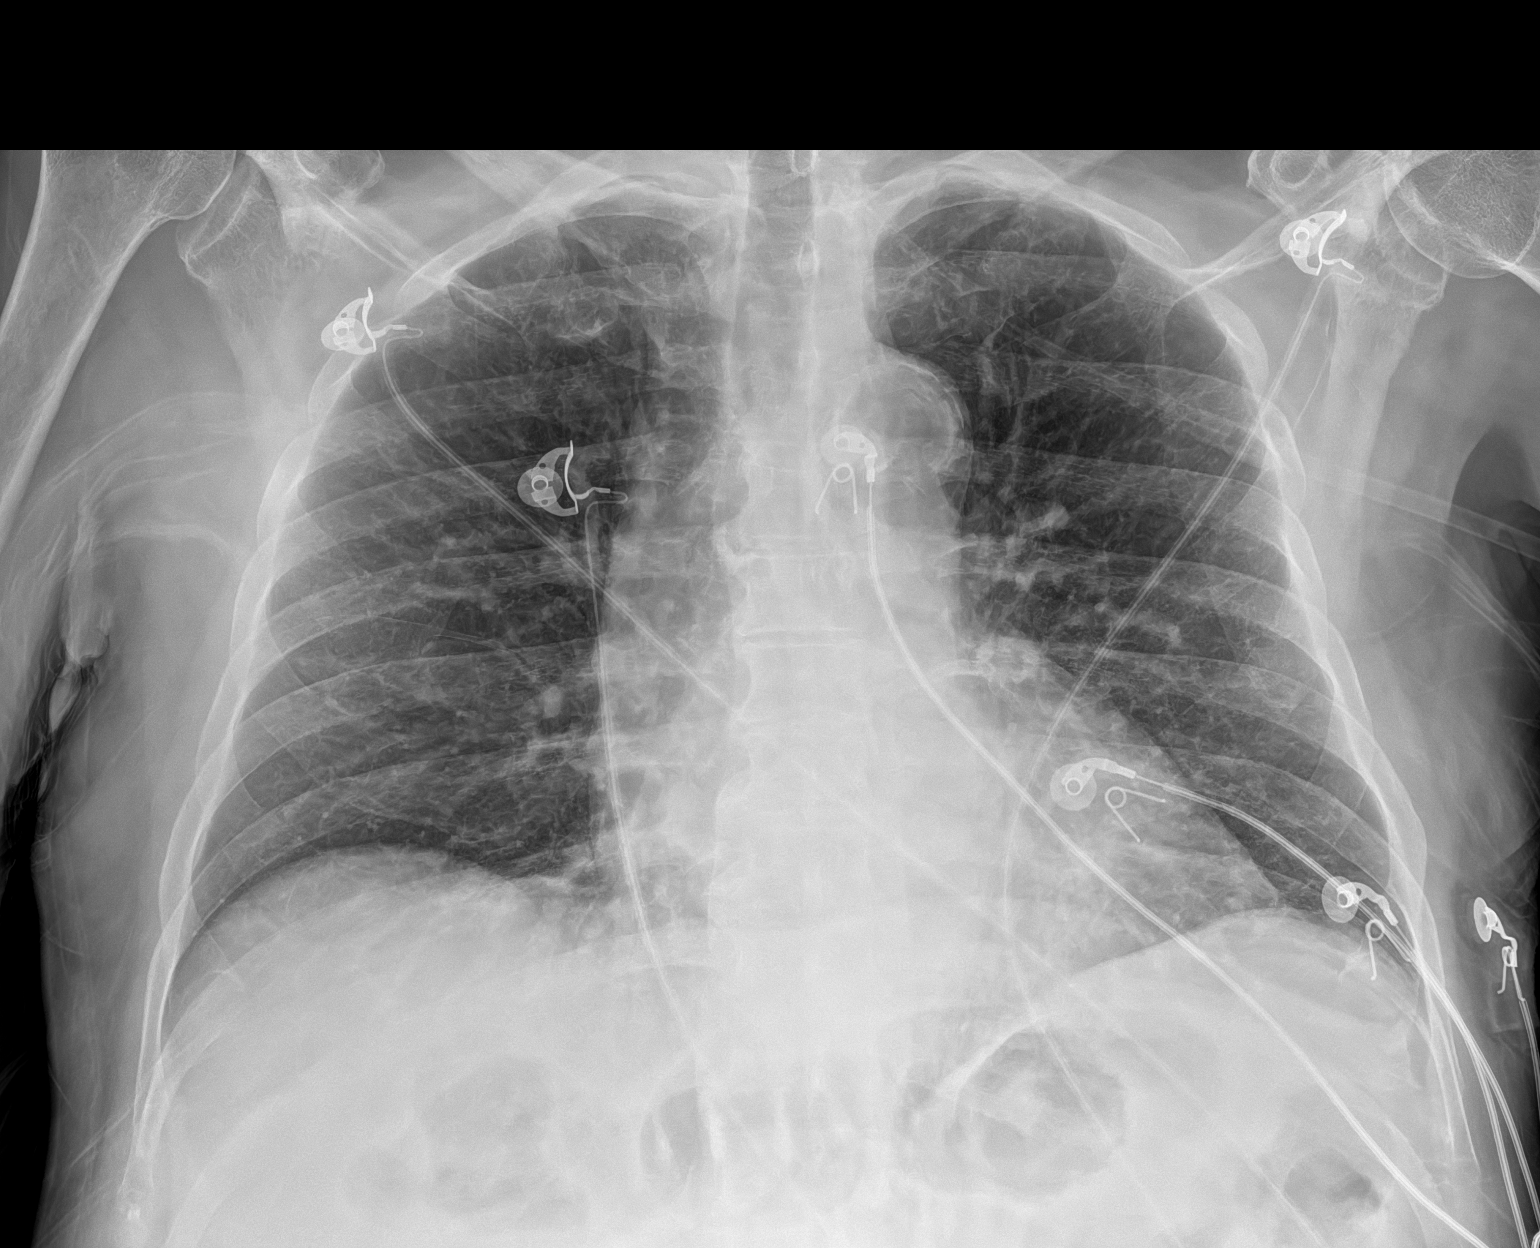

[1 of 1 positions shown; findings below may reference images not displayed]

FINDINGS: Single frontal view of the chest demonstrates an unremarkable
cardiac silhouette. No acute airspace disease, effusion, or
pneumothorax. No acute fractures.

Please note that the pulmonary metastases and bony metastases seen
on prior CT scan are not readily apparent on this portable chest
x-ray.
IMPRESSION: 1. No acute intrathoracic process.
2. The intrathoracic and bony metastases seen on prior chest CT are
not apparent on this portable radiograph.

## 2019-07-20 MED ORDER — HYDROMORPHONE HCL 4 MG PO TABS: 4.0000 mg | ORAL_TABLET | ORAL | 0 refills | Status: DC | PRN

## 2019-07-20 MED ORDER — SODIUM CHLORIDE 0.9 % IV BOLUS
500.0000 mL | Freq: Once | INTRAVENOUS | Status: AC
  Administered 2019-07-20: 500 mL via INTRAVENOUS

## 2019-07-20 NOTE — ED Provider Notes (Signed)
[facility] EMERGENCY DEPARTMENT Provider Note   CSN: [id number] Arrival date & time: 07/20/19  2010     History Chief Complaint  Patient presents with  . Shortness of Breath    Keith Olson. is a 79 y.o. male.  Patient complains of mild shortness of breath.  Patient has metastatic prostate cancer that has metastases to the lungs-  The history is provided by the patient and a significant other. No language interpreter was used.  Shortness of Breath Severity:  Mild Onset quality:  Sudden Timing:  Intermittent Progression:  Waxing and waning Chronicity:  New Context: activity   Relieved by:  Nothing Worsened by:  Nothing Ineffective treatments:  None tried Associated symptoms: abdominal pain   Associated symptoms: no chest pain, no cough, no headaches and no rash        Past Medical History:  Diagnosis Date  . Agent orange exposure   . Arthritis   . Coronary artery disease    BMS to mid LAD 2001, DES to proximal LAD 2017  . Enlarged prostate    XRT 2016  . Headache    Sinus headaches   . Heart abnormality   . History of depression   . Hyperlipidemia   . Hypothyroidism   . Prostate cancer ([place]) 07/2014   hormonal therapy, external beam radiation therapy  . PTSD (post-traumatic stress disorder)     Patient Active Problem List   Diagnosis Date Noted  . Urinary retention 07/18/2019  . Hyponatremia 06/29/2019  . Acute bilateral low back pain with sciatica   . Multiple lung nodules on CT   . 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 Unstable angina ([place])   . History of depression 09/05/2017  . Hypothyroidism 09/05/2017  . Depression, major, single episode, moderate ([place]) 11/26/2016  . Prostatitis 11/26/2016  . Rectal bleeding 02/25/2016  . Pain management contract signed 01/27/2016  . Constipation 04/22/2015  . Lower abdominal pain 04/22/2015  . Anxiety state 12/16/2014  . Prostate cancer  metastatic to bone ([place]) 09/19/2014  . 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 Hyperlipidemia 05/22/2012  . BPH (benign prostatic hyperplasia) 05/22/2012  . Chest pain 02/24/2012  . CAD (coronary artery disease) 02/24/2012    Past Surgical History:  Procedure Laterality Date  . BRONCHIAL BRUSHINGS  07/02/2019   Procedure: BRONCHIAL BRUSHINGS;  Surgeon: Rigoberto Noel, MD;  [facility];  Service: Cardiopulmonary;;  . BRONCHIAL NEEDLE ASPIRATION BIOPSY  07/02/2019   Procedure: BRONCHIAL NEEDLE ASPIRATION BIOPSIES;  Surgeon: Rigoberto Noel, MD;  [facility];  Service: Cardiopulmonary;;  . BRONCHIAL WASHINGS  07/02/2019   Procedure: BRONCHIAL WASHINGS;  Surgeon: Rigoberto Noel, MD;  [facility];  Service: Cardiopulmonary;;  . CARDIAC CATHETERIZATION N/A 12/12/2015   Procedure: Left Heart Cath and Coronary Angiography;  Surgeon: Peter M Martinique, MD;  Location: [place] [facility];  Service: Cardiovascular;  Laterality: N/A;  . CARDIAC CATHETERIZATION N/A 12/12/2015   Procedure: Coronary Stent Intervention;  Surgeon: Peter M Martinique, MD;  Location: [place] [facility];  Service: Cardiovascular;  Laterality: N/A;  . CORONARY STENT INTERVENTION N/A 09/06/2017   Procedure: CORONARY STENT INTERVENTION;  Surgeon: Burnell Blanks, MD;  Location: [place] [facility];  Service: Cardiovascular;  Laterality: N/A;  . ENDOBRONCHIAL ULTRASOUND N/A 07/02/2019   Procedure: ENDOBRONCHIAL ULTRASOUND;  Surgeon: Rigoberto Noel, MD;  [facility];  Service: Cardiopulmonary;  Laterality: N/A;  . FLEXIBLE BRONCHOSCOPY  07/02/2019  Procedure: FLEXIBLE BRONCHOSCOPY;  Surgeon: Rigoberto Noel, MD;  Location: [facility];  Service: Cardiopulmonary;;  . HERNIA REPAIR Right   . LEFT HEART CATH AND CORONARY ANGIOGRAPHY N/A 09/02/2016   Procedure: Left Heart Cath and Coronary Angiography;  Surgeon: Leonie Man, MD;  Location: [place] [facility];  Service: Cardiovascular;  Laterality:  N/A;  . LEFT HEART CATH AND CORONARY ANGIOGRAPHY N/A 09/06/2017   Procedure: LEFT HEART CATH AND CORONARY ANGIOGRAPHY;  Surgeon: Burnell Blanks, MD;  Location: [place] [facility];  Service: Cardiovascular;  Laterality: N/A;  . LEFT HEART CATH AND CORONARY ANGIOGRAPHY N/A 05/04/2019   Procedure: LEFT HEART CATH AND CORONARY ANGIOGRAPHY;  Surgeon: Martinique, Peter M, MD;  Location: [place] [facility];  Service: Cardiovascular;  Laterality: N/A;  . PROSTATE BIOPSY N/A 08/28/2014   Procedure: BIOPSY TRANSRECTAL ULTRASONIC PROSTATE (TUBP);  Surgeon: Rana Snare, MD;  [facility];  Service: Urology;  Laterality: N/A;  . TRANSURETHRAL RESECTION OF PROSTATE N/A 08/28/2014   Procedure: TRANSURETHRAL RESECTION OF THE PROSTATE WITH GYRUS INSTRUMENTS;  Surgeon: Rana Snare, MD;  [facility];  Service: Urology;  Laterality: N/A;       Family History  Problem Relation Age of Onset  . Arthritis Mother   . Heart attack Father     Social History   Tobacco Use  . Smoking status: Never Smoker  . Smokeless tobacco: Former Systems developer    Types: Chew  Substance Use Topics  . Alcohol use: No  . Drug use: No    Home Medications Prior to Admission medications   Medication Sig Start Date End Date Taking? Authorizing Provider  ALPRAZolam Duanne Moron) 0.5 MG tablet Take 0.5 mg by mouth 2 (two) times daily as needed. 04/05/19  Yes [provider]  aspirin EC 81 MG tablet Take 1 tablet (81 mg total) by mouth daily. 12/09/15  Yes Satira Sark, MD  bicalutamide (CASODEX) 50 MG tablet Take 1 tablet (50 mg total) by mouth daily. 07/05/19  Yes Curcio, Roselie Awkward, NP  dexamethasone (DECADRON) 4 MG tablet Take 1 tablet (4 mg total) by mouth 2 (two) times daily with a meal. 07/06/19  Yes Rai, Ripudeep K, MD  diclofenac sodium (VOLTAREN) 1 % GEL APPLY 2 GRAMS TOPICALLY 4 TIMES DAILY Patient taking differently: Apply 2 g topically 4 (four) times daily as needed.  12/21/18  Yes Dettinger, Fransisca Kaufmann, MD    DULoxetine (CYMBALTA) 60 MG capsule Take 60 mg by mouth daily. 07/17/19  Yes [provider]  fentaNYL (DURAGESIC) 25 MCG/HR Place 1 patch onto the skin every 3 (three) days. 07/06/19  Yes Rai, Ripudeep K, MD  gabapentin (NEURONTIN) 400 MG capsule Take 1 capsule (400 mg total) by mouth 3 (three) times daily. 07/06/19  Yes Rai, Ripudeep K, MD  HYDROmorphone (DILAUDID) 4 MG tablet Take 1 tablet (4 mg total) by mouth every 4 (four) hours as needed for moderate pain or severe pain. 07/20/19  Yes Derek Jack, MD  levothyroxine (SYNTHROID, LEVOTHROID) 75 MCG tablet Take 1 tablet (75 mcg total) by mouth daily before breakfast. 09/16/17  Yes Dettinger, Fransisca Kaufmann, MD  lidocaine (LIDODERM) 5 % Place 1 patch onto the skin daily. Remove & Discard patch within 12 hours or as directed by MD 07/06/19  Yes Rai, Ripudeep K, MD  meclizine (ANTIVERT) 25 MG tablet Take 1 tablet (25 mg total) by mouth 2 (two) times daily as needed for dizziness. 07/06/19  Yes Rai, Ripudeep K, MD  nitroGLYCERIN (NITROSTAT) 0.4  MG SL tablet Place 1 tablet (0.4 mg total) under the tongue every 5 (five) minutes as needed for chest pain. X 3 doses Patient taking differently: Place 0.4 mg under the tongue every 5 (five) minutes as needed for chest pain.  09/07/17  Yes Bhagat, Bhavinkumar, PA  nystatin (MYCOSTATIN) 100000 UNIT/ML suspension Take 5 mLs by mouth 4 (four) times daily. 07/19/19  Yes [provider]  pantoprazole (PROTONIX) 40 MG tablet Take 1 tablet (40 mg total) by mouth daily. 07/06/19  Yes Rai, Ripudeep Raliegh Ip, MD    Allergies    Lipitor [atorvastatin]  Review of Systems   Review of Systems  Constitutional: Negative for appetite change and fatigue.  HENT: Negative for congestion, ear discharge and sinus pressure.   Eyes: Negative for discharge.  Respiratory: Positive for shortness of breath. Negative for cough.   Cardiovascular: Negative for chest pain.  Gastrointestinal: Positive for abdominal pain. Negative for  diarrhea.  Genitourinary: Negative for frequency and hematuria.  Musculoskeletal: Negative for back pain.  Skin: Negative for rash.  Neurological: Negative for seizures and headaches.  Psychiatric/Behavioral: Negative for hallucinations.    Physical Exam Updated Vital Signs BP (!) 145/72 (BP Location: Left Arm)   Pulse 62   Temp 98.3 F (36.8 C) (Oral)   Resp 14   Ht _0  (1.727 m)   Wt 66 kg   SpO2 97%   BMI 22.12 kg/m   Physical Exam Vitals reviewed.  Constitutional:      Appearance: He is well-developed.  HENT:     Head: Normocephalic.     Nose: Nose normal.  Eyes:     General: No scleral icterus.    Conjunctiva/sclera: Conjunctivae normal.  Neck:     Thyroid: No thyromegaly.  Cardiovascular:     Rate and Rhythm: Normal rate and regular rhythm.     Heart sounds: No murmur. No friction rub. No gallop.   Pulmonary:     Breath sounds: No stridor. No wheezing or rales.  Chest:     Chest wall: No tenderness.  Abdominal:     General: There is no distension.     Tenderness: There is no abdominal tenderness. There is no rebound.  Musculoskeletal:        General: Normal range of motion.     Cervical back: Neck supple.  Lymphadenopathy:     Cervical: No cervical adenopathy.  Skin:    Findings: No erythema or rash.  Neurological:     Mental Status: He is alert and oriented to person, place, and time.     Motor: No abnormal muscle tone.     Coordination: Coordination normal.  Psychiatric:        Behavior: Behavior normal.     ED Results / Procedures / Treatments   Labs (all labs ordered are listed, but only abnormal results are displayed) Labs Reviewed  CBC WITH DIFFERENTIAL/PLATELET - Abnormal; Notable for the following components:      Result Value   RBC 3.44 (*)    Hemoglobin 10.6 (*)    HCT 31.9 (*)    Abs Immature Granulocytes 0.14 (*)    All other components within normal limits  COMPREHENSIVE METABOLIC PANEL - Abnormal; Notable for the following  components:   Sodium 125 (*)    Chloride 91 (*)    Glucose, Bld 119 (*)    Creatinine, Ser 0.41 (*)    Calcium 7.4 (*)    Total Protein 5.9 (*)    Albumin 2.5 (*)  ALT 61 (*)    Alkaline Phosphatase 374 (*)    All other components within normal limits  URINALYSIS, ROUTINE W REFLEX MICROSCOPIC  TROPONIN I (HIGH SENSITIVITY)  TROPONIN I (HIGH SENSITIVITY)    EKG None  Radiology DG Chest Portable 1 View  Result Date: 07/20/2019 CLINICAL DATA:  Short of breath since yesterday, weakness, history of prostate cancer EXAM: PORTABLE CHEST 1 VIEW COMPARISON:  05/03/2019, 06/27/2019 FINDINGS: Single frontal view of the chest demonstrates an unremarkable cardiac silhouette. No acute airspace disease, effusion, or pneumothorax. No acute fractures. Please note that the pulmonary metastases and bony metastases seen on prior CT scan are not readily apparent on this portable chest x-ray. IMPRESSION: 1. No acute intrathoracic process. 2. The intrathoracic and bony metastases seen on prior chest CT are not apparent on this portable radiograph. Electronically Signed   By: Randa Ngo M.D.   On: 07/20/2019 21:00    Procedures Procedures (including critical care time)  Medications Ordered in ED Medications  sodium chloride 0.9 % bolus 500 mL (has no administration in time range)    ED Course  I have reviewed the triage vital signs and the nursing notes.  Pertinent labs & imaging results that were available during my care of the patient were reviewed by me and considered in my medical decision making (see chart for details).    MDM Rules/Calculators/A&P                     Patient with metastatic prostate cancer that has metastases to lung.  Patient had some shortness of breath but is completely resolved now.  Patient has mild hyponatremia.  Patient will follow up with his PCP next week to have recheck his sodium       This patient presents to the ED for concern of shortness of breath  this involves an extensive number of treatment options, and is a complaint that carries with it a high risk of complications and morbidity.  The differential diagnosis includes pneumonia COPD lung metastases   Lab Tests:   I Ordered, reviewed, and interpreted labs, which included CBC and chemistries which showed mild hyponatremia and anemia  Medicines ordered:  I ordered medication patient given normal saline for dehydration Imaging Studies ordered:   I ordered imaging studies which included chest x-ray and  I independently visualized and interpreted imaging which showed no acute disease  Additional history obtained:   Additional history obtained from old records  Previous records obtained and reviewed   Consultations Obtained:   Reevaluation:  After the interventions stated above, I reevaluated the patient and found improved and not short of breath  Critical Interventions:  .   Final Clinical Impression(s) / ED Diagnoses Final diagnoses:  SOB (shortness of breath)    Rx / DC Orders ED Discharge Orders    None       Milton Ferguson, MD 07/20/19 [id number]

## 2019-07-20 NOTE — ED Triage Notes (Signed)
Pt c/o shortness of breath since yesterday that got worse this afternoon. Pt also states that he has been having weakness for about a week. Hx of prostate CA. Family is concerned that it has moved to his lungs now.

## 2019-07-20 NOTE — Telephone Encounter (Signed)
Received voicemail message from patient's son, Keith Olson, requesting refill of his father's Dilaudid. [name] states, "he only has four pills left." Per Ashlyn Bruning, PA-C I instructed [name] to phone Dr. Lamonte Richer and request the refill since his father is no longer actively receiving radiation therapy. Provided [name] with Dr. Marthann Schiller nurses' number. [name] verbalized understanding and appreciation for the assistance.

## 2019-07-20 NOTE — Discharge Instructions (Addendum)
Follow up with your md next week for recheck of your sodium level.

## 2019-07-22 ENCOUNTER — Encounter (HOSPITAL_COMMUNITY): Payer: Self-pay

## 2019-07-22 ENCOUNTER — Other Ambulatory Visit: Payer: Self-pay

## 2019-07-22 ENCOUNTER — Inpatient Hospital Stay (HOSPITAL_COMMUNITY)
Admission: EM | Admit: 2019-07-22 | Discharge: 2019-07-25 | DRG: 699 | Disposition: A | Discharge status: Skilled Nursing Facility | Payer: Medicare Other | Attending: Family Medicine | Admitting: Family Medicine

## 2019-07-22 DIAGNOSIS — Z7982 Long term (current) use of aspirin: Secondary | ICD-10-CM | POA: Diagnosis not present

## 2019-07-22 DIAGNOSIS — R350 Frequency of micturition: Secondary | ICD-10-CM | POA: Diagnosis not present

## 2019-07-22 DIAGNOSIS — I10 Essential (primary) hypertension: Secondary | ICD-10-CM | POA: Diagnosis not present

## 2019-07-22 DIAGNOSIS — T83511A Infection and inflammatory reaction due to indwelling urethral catheter, initial encounter: Principal | ICD-10-CM | POA: Diagnosis present

## 2019-07-22 DIAGNOSIS — E46 Unspecified protein-calorie malnutrition: Secondary | ICD-10-CM | POA: Diagnosis present

## 2019-07-22 DIAGNOSIS — R300 Dysuria: Secondary | ICD-10-CM

## 2019-07-22 DIAGNOSIS — R0902 Hypoxemia: Secondary | ICD-10-CM | POA: Diagnosis not present

## 2019-07-22 DIAGNOSIS — Z7952 Long term (current) use of systemic steroids: Secondary | ICD-10-CM | POA: Diagnosis not present

## 2019-07-22 DIAGNOSIS — M1990 Unspecified osteoarthritis, unspecified site: Secondary | ICD-10-CM | POA: Diagnosis present

## 2019-07-22 DIAGNOSIS — E039 Hypothyroidism, unspecified: Secondary | ICD-10-CM | POA: Diagnosis present

## 2019-07-22 DIAGNOSIS — Z888 Allergy status to other drugs, medicaments and biological substances status: Secondary | ICD-10-CM

## 2019-07-22 DIAGNOSIS — Z743 Need for continuous supervision: Secondary | ICD-10-CM | POA: Diagnosis not present

## 2019-07-22 DIAGNOSIS — Y92009 Unspecified place in unspecified non-institutional (private) residence as the place of occurrence of the external cause: Secondary | ICD-10-CM | POA: Diagnosis not present

## 2019-07-22 DIAGNOSIS — E871 Hypo-osmolality and hyponatremia: Secondary | ICD-10-CM | POA: Diagnosis present

## 2019-07-22 DIAGNOSIS — Y846 Urinary catheterization as the cause of abnormal reaction of the patient, or of later complication, without mention of misadventure at the time of the procedure: Secondary | ICD-10-CM | POA: Diagnosis present

## 2019-07-22 DIAGNOSIS — C7951 Secondary malignant neoplasm of bone: Secondary | ICD-10-CM | POA: Diagnosis present

## 2019-07-22 DIAGNOSIS — N401 Enlarged prostate with lower urinary tract symptoms: Secondary | ICD-10-CM | POA: Diagnosis present

## 2019-07-22 DIAGNOSIS — C7800 Secondary malignant neoplasm of unspecified lung: Secondary | ICD-10-CM | POA: Diagnosis present

## 2019-07-22 DIAGNOSIS — I2510 Atherosclerotic heart disease of native coronary artery without angina pectoris: Secondary | ICD-10-CM | POA: Diagnosis not present

## 2019-07-22 DIAGNOSIS — F329 Major depressive disorder, single episode, unspecified: Secondary | ICD-10-CM | POA: Diagnosis present

## 2019-07-22 DIAGNOSIS — Z20822 Contact with and (suspected) exposure to covid-19: Secondary | ICD-10-CM | POA: Diagnosis present

## 2019-07-22 DIAGNOSIS — C412 Malignant neoplasm of vertebral column: Secondary | ICD-10-CM | POA: Diagnosis not present

## 2019-07-22 DIAGNOSIS — N390 Urinary tract infection, site not specified: Secondary | ICD-10-CM | POA: Diagnosis not present

## 2019-07-22 DIAGNOSIS — Z79899 Other long term (current) drug therapy: Secondary | ICD-10-CM | POA: Diagnosis not present

## 2019-07-22 DIAGNOSIS — I25110 Atherosclerotic heart disease of native coronary artery with unstable angina pectoris: Secondary | ICD-10-CM | POA: Diagnosis present

## 2019-07-22 DIAGNOSIS — Z8546 Personal history of malignant neoplasm of prostate: Secondary | ICD-10-CM

## 2019-07-22 DIAGNOSIS — N138 Other obstructive and reflux uropathy: Secondary | ICD-10-CM | POA: Diagnosis present

## 2019-07-22 DIAGNOSIS — E441 Mild protein-calorie malnutrition: Secondary | ICD-10-CM | POA: Diagnosis present

## 2019-07-22 DIAGNOSIS — R52 Pain, unspecified: Secondary | ICD-10-CM | POA: Diagnosis not present

## 2019-07-22 DIAGNOSIS — E440 Moderate protein-calorie malnutrition: Secondary | ICD-10-CM | POA: Diagnosis not present

## 2019-07-22 DIAGNOSIS — Z955 Presence of coronary angioplasty implant and graft: Secondary | ICD-10-CM

## 2019-07-22 DIAGNOSIS — W19XXXA Unspecified fall, initial encounter: Secondary | ICD-10-CM | POA: Diagnosis present

## 2019-07-22 DIAGNOSIS — C61 Malignant neoplasm of prostate: Secondary | ICD-10-CM | POA: Diagnosis present

## 2019-07-22 DIAGNOSIS — R748 Abnormal levels of other serum enzymes: Secondary | ICD-10-CM | POA: Diagnosis present

## 2019-07-22 DIAGNOSIS — E785 Hyperlipidemia, unspecified: Secondary | ICD-10-CM | POA: Diagnosis present

## 2019-07-22 DIAGNOSIS — R5381 Other malaise: Secondary | ICD-10-CM | POA: Diagnosis not present

## 2019-07-22 DIAGNOSIS — Z923 Personal history of irradiation: Secondary | ICD-10-CM

## 2019-07-22 DIAGNOSIS — Z85830 Personal history of malignant neoplasm of bone: Secondary | ICD-10-CM | POA: Diagnosis not present

## 2019-07-22 DIAGNOSIS — F419 Anxiety disorder, unspecified: Secondary | ICD-10-CM | POA: Diagnosis present

## 2019-07-22 DIAGNOSIS — R531 Weakness: Secondary | ICD-10-CM | POA: Diagnosis not present

## 2019-07-22 DIAGNOSIS — Z87891 Personal history of nicotine dependence: Secondary | ICD-10-CM | POA: Diagnosis not present

## 2019-07-22 DIAGNOSIS — R319 Hematuria, unspecified: Secondary | ICD-10-CM | POA: Diagnosis not present

## 2019-07-22 DIAGNOSIS — Z6822 Body mass index (BMI) 22.0-22.9, adult: Secondary | ICD-10-CM

## 2019-07-22 DIAGNOSIS — Z66 Do not resuscitate: Secondary | ICD-10-CM | POA: Diagnosis present

## 2019-07-22 DIAGNOSIS — R519 Headache, unspecified: Secondary | ICD-10-CM | POA: Diagnosis present

## 2019-07-22 LAB — COMPREHENSIVE METABOLIC PANEL
ALT: 42 U/L (ref 0–44)
AST: 22 U/L (ref 15–41)
Albumin: 2.6 g/dL — ABNORMAL LOW (ref 3.5–5.0)
Alkaline Phosphatase: 459 U/L — ABNORMAL HIGH (ref 38–126)
Anion gap: 7 (ref 5–15)
BUN: 8 mg/dL (ref 8–23)
CO2: 26 mmol/L (ref 22–32)
Calcium: 7.5 mg/dL — ABNORMAL LOW (ref 8.9–10.3)
Chloride: 94 mmol/L — ABNORMAL LOW (ref 98–111)
Creatinine, Ser: 0.5 mg/dL — ABNORMAL LOW (ref 0.61–1.24)
GFR calc Af Amer: >60 mL/min (ref >60)
GFR calc non Af Amer: >60 mL/min (ref >60)
Glucose, Bld: 117 mg/dL — ABNORMAL HIGH (ref 70–99)
Potassium: 3.9 mmol/L (ref 3.5–5.1)
Sodium: 127 mmol/L — ABNORMAL LOW (ref 135–145)
Total Bilirubin: 0.6 mg/dL (ref 0.3–1.2)
Total Protein: 6.2 g/dL — ABNORMAL LOW (ref 6.5–8.1)

## 2019-07-22 LAB — CBC WITH DIFFERENTIAL/PLATELET
Abs Immature Granulocytes: 0.23 10*3/uL — ABNORMAL HIGH (ref 0.00–0.07)
Basophils Absolute: 0 10*3/uL (ref 0.0–0.1)
Basophils Relative: 0 %
Eosinophils Absolute: 0 10*3/uL (ref 0.0–0.5)
Eosinophils Relative: 0 %
HCT: 33.8 % — ABNORMAL LOW (ref 39.0–52.0)
Hemoglobin: 11.2 g/dL — ABNORMAL LOW (ref 13.0–17.0)
Immature Granulocytes: 2 %
Lymphocytes Relative: 5 %
Lymphs Abs: 0.5 10*3/uL — ABNORMAL LOW (ref 0.7–4.0)
MCH: 31 pg (ref 26.0–34.0)
MCHC: 33.1 g/dL (ref 30.0–36.0)
MCV: 93.6 fL (ref 80.0–100.0)
Monocytes Absolute: 0.5 10*3/uL (ref 0.1–1.0)
Monocytes Relative: 5 %
Neutro Abs: 10 10*3/uL — ABNORMAL HIGH (ref 1.7–7.7)
Neutrophils Relative %: 88 %
Platelets: 275 10*3/uL (ref 150–400)
RBC: 3.61 MIL/uL — ABNORMAL LOW (ref 4.22–5.81)
RDW: 14.8 % (ref 11.5–15.5)
WBC: 11.3 10*3/uL — ABNORMAL HIGH (ref 4.0–10.5)
nRBC: 0 % (ref 0.0–0.2)

## 2019-07-22 LAB — URINALYSIS, ROUTINE W REFLEX MICROSCOPIC
Bilirubin Urine: NEGATIVE
Glucose, UA: NEGATIVE mg/dL
Ketones, ur: NEGATIVE mg/dL
Nitrite: NEGATIVE
Protein, ur: NEGATIVE mg/dL
Specific Gravity, Urine: 1.002 — ABNORMAL LOW (ref 1.005–1.030)
pH: 9 — ABNORMAL HIGH (ref 5.0–8.0)

## 2019-07-22 LAB — SARS CORONAVIRUS 2 BY RT PCR (HOSPITAL ORDER, PERFORMED IN ~~LOC~~ HOSPITAL LAB): SARS Coronavirus 2: NEGATIVE

## 2019-07-22 MED ORDER — THIAMINE HCL 100 MG PO TABS
100.0000 mg | ORAL_TABLET | Freq: Every day | ORAL | Status: DC
  Administered 2019-07-22 – 2019-07-25 (×4): 100 mg via ORAL
  Filled 2019-07-22 (×4): qty 1

## 2019-07-22 MED ORDER — SODIUM CHLORIDE 0.9 % IV SOLN: INTRAVENOUS | Status: DC

## 2019-07-22 MED ORDER — SODIUM CHLORIDE 0.9 % IV SOLN
1.0000 g | Freq: Once | INTRAVENOUS | Status: AC
  Administered 2019-07-22: 1 g via INTRAVENOUS
  Filled 2019-07-22: qty 10

## 2019-07-22 MED ORDER — ONDANSETRON HCL 4 MG/2ML IJ SOLN: 4.0000 mg | Freq: Four times a day (QID) | INTRAMUSCULAR | Status: DC | PRN

## 2019-07-22 MED ORDER — HYDROMORPHONE HCL 4 MG PO TABS
4.0000 mg | ORAL_TABLET | ORAL | Status: DC | PRN
  Administered 2019-07-22 – 2019-07-25 (×14): 4 mg via ORAL
  Filled 2019-07-22 (×14): qty 1

## 2019-07-22 MED ORDER — ASPIRIN EC 81 MG PO TBEC
81.0000 mg | DELAYED_RELEASE_TABLET | Freq: Every day | ORAL | Status: DC
  Administered 2019-07-23 – 2019-07-25 (×3): 81 mg via ORAL
  Filled 2019-07-22 (×4): qty 1

## 2019-07-22 MED ORDER — ASPIRIN EC 81 MG PO TBEC: 81.0000 mg | DELAYED_RELEASE_TABLET | Freq: Every day | ORAL | Status: DC

## 2019-07-22 MED ORDER — ENSURE ENLIVE PO LIQD
237.0000 mL | Freq: Two times a day (BID) | ORAL | Status: DC
  Administered 2019-07-23: 237 mL via ORAL

## 2019-07-22 MED ORDER — HYDROMORPHONE HCL 2 MG PO TABS
4.0000 mg | ORAL_TABLET | Freq: Once | ORAL | Status: AC
  Administered 2019-07-22: 4 mg via ORAL
  Filled 2019-07-22: qty 2

## 2019-07-22 MED ORDER — ONDANSETRON HCL 4 MG PO TABS: 4.0000 mg | ORAL_TABLET | Freq: Four times a day (QID) | ORAL | Status: DC | PRN

## 2019-07-22 MED ORDER — DICLOFENAC SODIUM 1 % TD GEL
2.0000 g | Freq: Four times a day (QID) | TRANSDERMAL | Status: DC | PRN
  Filled 2019-07-22: qty 100

## 2019-07-22 MED ORDER — GABAPENTIN 400 MG PO CAPS
400.0000 mg | ORAL_CAPSULE | Freq: Three times a day (TID) | ORAL | Status: DC
  Administered 2019-07-22 – 2019-07-25 (×8): 400 mg via ORAL
  Filled 2019-07-22 (×8): qty 1

## 2019-07-22 MED ORDER — BICALUTAMIDE 50 MG PO TABS
50.0000 mg | ORAL_TABLET | Freq: Every day | ORAL | Status: DC
  Administered 2019-07-23 – 2019-07-25 (×3): 50 mg via ORAL
  Filled 2019-07-22 (×5): qty 1

## 2019-07-22 MED ORDER — POLYETHYLENE GLYCOL 3350 17 G PO PACK
17.0000 g | PACK | Freq: Every day | ORAL | Status: DC | PRN
  Administered 2019-07-23 – 2019-07-24 (×2): 17 g via ORAL
  Filled 2019-07-22 (×2): qty 1

## 2019-07-22 MED ORDER — PANTOPRAZOLE SODIUM 40 MG PO TBEC
40.0000 mg | DELAYED_RELEASE_TABLET | Freq: Every day | ORAL | Status: DC
  Administered 2019-07-23 – 2019-07-25 (×3): 40 mg via ORAL
  Filled 2019-07-22 (×3): qty 1

## 2019-07-22 MED ORDER — ENOXAPARIN SODIUM 40 MG/0.4ML ~~LOC~~ SOLN
40.0000 mg | SUBCUTANEOUS | Status: DC
  Administered 2019-07-22 – 2019-07-24 (×3): 40 mg via SUBCUTANEOUS
  Filled 2019-07-22 (×3): qty 0.4

## 2019-07-22 MED ORDER — FOLIC ACID 1 MG PO TABS
1.0000 mg | ORAL_TABLET | Freq: Every day | ORAL | Status: DC
  Administered 2019-07-22 – 2019-07-25 (×4): 1 mg via ORAL
  Filled 2019-07-22 (×4): qty 1

## 2019-07-22 MED ORDER — DEXAMETHASONE 4 MG PO TABS
4.0000 mg | ORAL_TABLET | Freq: Two times a day (BID) | ORAL | Status: DC
  Administered 2019-07-23 – 2019-07-25 (×5): 4 mg via ORAL
  Filled 2019-07-22 (×5): qty 1

## 2019-07-22 MED ORDER — ALPRAZOLAM 0.5 MG PO TABS: 0.5000 mg | ORAL_TABLET | Freq: Two times a day (BID) | ORAL | Status: DC | PRN

## 2019-07-22 MED ORDER — MECLIZINE HCL 12.5 MG PO TABS
25.0000 mg | ORAL_TABLET | Freq: Two times a day (BID) | ORAL | Status: DC | PRN
  Administered 2019-07-25: 25 mg via ORAL
  Filled 2019-07-22: qty 2

## 2019-07-22 MED ORDER — SODIUM CHLORIDE 0.9 % IV SOLN
1.0000 g | INTRAVENOUS | Status: DC
  Administered 2019-07-23 – 2019-07-25 (×3): 1 g via INTRAVENOUS
  Filled 2019-07-22 (×4): qty 10

## 2019-07-22 NOTE — ED Provider Notes (Signed)
[facility] EMERGENCY DEPARTMENT Provider Note   CSN: [id number] Arrival date & time: 07/22/19  1256     History Chief Complaint  Patient presents with  . Weakness    Keith Olson. is a 79 y.o. 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  Patient states he lives at home by himself and is unable to perform his ADLs.  He states his weakness has been present for the last several weeks however has significantly worsened over the last x2 days.  He is also endorsing numbness in all of his extremities.  He has difficulty walking getting around secondary to the numbness.  He denies any recent falls.  He is also endorsing shortness of breath and dysuria x3 days.  Patient has an indwelling Foley catheter.  He states he was seen in the emergency department x2 days ago and was discharged home with plan to follow-up with oncologist which has been unable to do.  He denies any recent weight loss, fever, chills, chest pain, abdominal pain, nausea, vomiting, gross hematuria, diarrhea, blood in stool.  Chart review shows patient had his first appointment with Dr. Delton Coombes on 07/12/2019. He is scheduled for a PET scan tomorrow.  He had a negative DVT study on 07/12/2019.  Past Medical History:  Diagnosis Date  . Agent orange exposure   . Arthritis   . Coronary artery disease    BMS to mid LAD 2001, DES to proximal LAD 2017  . Enlarged prostate    XRT 2016  . Headache    Sinus headaches   . Heart abnormality   . History of depression   . Hyperlipidemia   . Hypothyroidism   . Prostate cancer ([place]) 07/2014   hormonal therapy, external beam radiation therapy  . PTSD (post-traumatic stress  disorder)     Patient Active Problem List   Diagnosis Date Noted  . Urinary retention 07/18/2019  . Hyponatremia 06/29/2019  . Acute bilateral low back pain with sciatica   . Multiple lung nodules on CT   . 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 Unstable angina ([place])   . History of depression 09/05/2017  . Hypothyroidism 09/05/2017  . Depression, major, single episode, moderate ([place]) 11/26/2016  . Prostatitis 11/26/2016  . Rectal bleeding 02/25/2016  . Pain management contract signed 01/27/2016  . Constipation 04/22/2015  . Lower abdominal pain 04/22/2015  . Anxiety state 12/16/2014  . 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 Hyperlipidemia 05/22/2012  . BPH (benign prostatic hyperplasia) 05/22/2012  . Chest pain 02/24/2012  . CAD (coronary artery disease) 02/24/2012    Past Surgical History:  Procedure Laterality Date  . BRONCHIAL BRUSHINGS  07/02/2019   Procedure: BRONCHIAL BRUSHINGS;  Surgeon: Rigoberto Noel, MD;  Location: [facility];  Service: Cardiopulmonary;;  . BRONCHIAL NEEDLE ASPIRATION BIOPSY  07/02/2019   Procedure: BRONCHIAL NEEDLE ASPIRATION BIOPSIES;  Surgeon: Rigoberto Noel, MD;  Location: [facility];  Service: Cardiopulmonary;;  . BRONCHIAL WASHINGS  07/02/2019   Procedure: BRONCHIAL WASHINGS;  Surgeon: Rigoberto Noel, MD;  Location: [facility];  Service: Cardiopulmonary;;  . CARDIAC CATHETERIZATION N/A 12/12/2015   Procedure: Left Heart Cath and Coronary Angiography;  Surgeon:  Peter M Martinique, MD;  Location: [place] [facility];  Service: Cardiovascular;  Laterality: N/A;  . CARDIAC CATHETERIZATION N/A 12/12/2015   Procedure: Coronary Stent Intervention;  Surgeon: Peter M Martinique, MD;  Location: [place] [facility];  Service: Cardiovascular;  Laterality: N/A;  . CORONARY STENT INTERVENTION N/A 09/06/2017   Procedure: CORONARY STENT INTERVENTION;   Surgeon: Burnell Blanks, MD;  Location: [place] [facility];  Service: Cardiovascular;  Laterality: N/A;  . ENDOBRONCHIAL ULTRASOUND N/A 07/02/2019   Procedure: ENDOBRONCHIAL ULTRASOUND;  Surgeon: Rigoberto Noel, MD;  Location: [facility];  Service: Cardiopulmonary;  Laterality: N/A;  . FLEXIBLE BRONCHOSCOPY  07/02/2019   Procedure: FLEXIBLE BRONCHOSCOPY;  Surgeon: Rigoberto Noel, MD;  Location: [facility];  Service: Cardiopulmonary;;  . HERNIA REPAIR Right   . LEFT HEART CATH AND CORONARY ANGIOGRAPHY N/A 09/02/2016   Procedure: Left Heart Cath and Coronary Angiography;  Surgeon: Leonie Man, MD;  Location: [place] [facility];  Service: Cardiovascular;  Laterality: N/A;  . LEFT HEART CATH AND CORONARY ANGIOGRAPHY N/A 09/06/2017   Procedure: LEFT HEART CATH AND CORONARY ANGIOGRAPHY;  Surgeon: Burnell Blanks, MD;  Location: [place] [facility];  Service: Cardiovascular;  Laterality: N/A;  . LEFT HEART CATH AND CORONARY ANGIOGRAPHY N/A 05/04/2019   Procedure: LEFT HEART CATH AND CORONARY ANGIOGRAPHY;  Surgeon: Martinique, Peter M, MD;  Location: [place] [facility];  Service: Cardiovascular;  Laterality: N/A;  . PROSTATE BIOPSY N/A 08/28/2014   Procedure: BIOPSY TRANSRECTAL ULTRASONIC PROSTATE (TUBP);  Surgeon: Rana Snare, MD;  Location: [facility];  Service: Urology;  Laterality: N/A;  . TRANSURETHRAL RESECTION OF PROSTATE N/A 08/28/2014   Procedure: TRANSURETHRAL RESECTION OF THE PROSTATE WITH GYRUS INSTRUMENTS;  Surgeon: Rana Snare, MD;  Location: [facility];  Service: Urology;  Laterality: N/A;       Family History  Problem Relation Age of Onset  . Arthritis Mother   . Heart attack Father     Social History   Tobacco Use  . Smoking status: Never Smoker  . Smokeless tobacco: Former Systems developer    Types: Chew  Substance Use Topics  . Alcohol use: No  . Drug use: No    Home Medications Prior to Admission medications   Medication Sig Start Date End Date Taking? Authorizing Provider    ALPRAZolam Duanne Moron) 0.5 MG tablet Take 0.5 mg by mouth 2 (two) times daily as needed. 04/05/19  Yes [provider]  aspirin EC 81 MG tablet Take 1 tablet (81 mg total) by mouth daily. 12/09/15  Yes Satira Sark, MD  bicalutamide (CASODEX) 50 MG tablet Take 1 tablet (50 mg total) by mouth daily. 07/05/19  Yes Curcio, Roselie Awkward, NP  dexamethasone (DECADRON) 4 MG tablet Take 1 tablet (4 mg total) by mouth 2 (two) times daily with a meal. 07/06/19  Yes Rai, Ripudeep K, MD  diclofenac sodium (VOLTAREN) 1 % GEL APPLY 2 GRAMS TOPICALLY 4 TIMES DAILY Patient taking differently: Apply 2 g topically 4 (four) times daily as needed.  12/21/18  Yes Dettinger, Fransisca Kaufmann, MD  fentaNYL (DURAGESIC) 25 MCG/HR Place 1 patch onto the skin every 3 (three) days. 07/06/19  Yes Rai, Ripudeep K, MD  gabapentin (NEURONTIN) 400 MG capsule Take 1 capsule (400 mg total) by mouth 3 (three) times daily. 07/06/19  Yes Rai, Ripudeep K, MD  HYDROmorphone (DILAUDID) 4 MG tablet Take 1 tablet (4 mg total) by mouth every 4 (four) hours as needed for moderate pain or severe pain. 07/20/19  Yes Derek Jack, MD  meclizine (ANTIVERT) 25 MG tablet Take 1 tablet (25 mg total) by mouth 2 (two) times daily as needed for dizziness. 07/06/19  Yes Rai, Ripudeep K, MD  nitroGLYCERIN (NITROSTAT) 0.4 MG SL tablet Place 1 tablet (0.4 mg total) under the tongue every 5 (five) minutes as needed for chest pain. X 3 doses Patient taking differently: Place 0.4 mg under the tongue every 5 (five) minutes as needed for chest pain.  09/07/17  Yes Bhagat, Bhavinkumar, PA  nystatin (MYCOSTATIN) 100000 UNIT/ML suspension Take 5 mLs by mouth 4 (four) times daily. 07/19/19  Yes [provider]  pantoprazole (PROTONIX) 40 MG tablet Take 1 tablet (40 mg total) by mouth daily. 07/06/19  Yes Rai, Ripudeep K, MD  polyethylene glycol powder (GLYCOLAX/MIRALAX) 17 GM/SCOOP powder Take 17 g by mouth daily as needed for mild constipation or moderate  constipation.   Yes [provider]  lidocaine (LIDODERM) 5 % Place 1 patch onto the skin daily. Remove & Discard patch within 12 hours or as directed by MD Patient not taking: Reported on 07/22/2019 07/06/19   Mendel Corning, MD    Allergies    Lipitor [atorvastatin]  Review of Systems   Review of Systems  All other systems are reviewed and are negative for acute change except as noted in the HPI.   Physical Exam Updated Vital Signs BP 127/70 (BP Location: Right Arm)   Pulse 66   Temp 98.1 F (36.7 C) (Oral)   Resp 18   Ht _0  (1.727 m)   Wt 65.8 kg   SpO2 94%   BMI 22.05 kg/m   Physical Exam Vitals and nursing note reviewed.  Constitutional:      General: He is not in acute distress.    Appearance: He is not ill-appearing.  HENT:     Head: Normocephalic and atraumatic.     Right Ear: Tympanic membrane and external ear normal.     Left Ear: Tympanic membrane and external ear normal.     Nose: Nose normal.     Mouth/Throat:     Mouth: Mucous membranes are moist.     Pharynx: Oropharynx is clear.  Eyes:     General: No scleral icterus.       Right eye: No discharge.        Left eye: No discharge.     Extraocular Movements: Extraocular movements intact.     Conjunctiva/sclera: Conjunctivae normal.     Pupils: Pupils are equal, round, and reactive to light.  Neck:     Vascular: No JVD.  Cardiovascular:     Rate and Rhythm: Normal rate and regular rhythm.     Pulses: Normal pulses.          Radial pulses are 2+ on the right side and 2+ on the left side.     Heart sounds: Normal heart sounds.  Pulmonary:     Comments: Lungs clear to auscultation in all fields. Symmetric chest rise. No wheezing, rales, or rhonchi. Abdominal:     Comments: Abdomen is soft, non-distended, and non-tender in all quadrants. No rigidity, no guarding. No peritoneal signs.  Genitourinary:    Comments: Indwelling Foley catheter with yellow urine seen in bag. Musculoskeletal:         General: Normal range of motion.     Cervical back: Normal range of motion.     Right lower leg: No edema.     Left lower leg: No edema.  Skin:  General: Skin is warm and dry.     Capillary Refill: Capillary refill takes less than 2 seconds.  Neurological:     Mental Status: He is oriented to person, place, and time.     GCS: GCS eye subscore is 4. GCS verbal subscore is 5. GCS motor subscore is 6.     Comments: Speech is clear and goal oriented, follows commands CN III-XII intact, no facial droop Normal strength in upper and lower extremities bilaterally including dorsiflexion and plantar flexion, strong and equal grip strength Sensation normal to light and sharp touch Moves extremities without ataxia, coordination intact Normal finger to nose and rapid alternating movements   Psychiatric:        Behavior: Behavior normal.     ED Results / Procedures / Treatments   Labs (all labs ordered are listed, but only abnormal results are displayed) Labs Reviewed  CBC WITH DIFFERENTIAL/PLATELET - Abnormal; Notable for the following components:      Result Value   WBC 11.3 (*)    RBC 3.61 (*)    Hemoglobin 11.2 (*)    HCT 33.8 (*)    Neutro Abs 10.0 (*)    Lymphs Abs 0.5 (*)    Abs Immature Granulocytes 0.23 (*)    All other components within normal limits  COMPREHENSIVE METABOLIC PANEL - Abnormal; Notable for the following components:   Sodium 127 (*)    Chloride 94 (*)    Glucose, Bld 117 (*)    Creatinine, Ser 0.50 (*)    Calcium 7.5 (*)    Total Protein 6.2 (*)    Albumin 2.6 (*)    Alkaline Phosphatase 459 (*)    All other components within normal limits  URINALYSIS, ROUTINE W REFLEX MICROSCOPIC - Abnormal; Notable for the following components:   APPearance HAZY (*)    Specific Gravity, Urine 1.002 (*)    pH 9.0 (*)    Hgb urine dipstick SMALL (*)    Leukocytes,Ua MODERATE (*)    Bacteria, UA RARE (*)    All other components within normal limits  URINE CULTURE     EKG None  Radiology DG Chest Portable 1 View  Result Date: 07/20/2019 CLINICAL DATA:  Short of breath since yesterday, weakness, history of prostate cancer EXAM: PORTABLE CHEST 1 VIEW COMPARISON:  05/03/2019, 06/27/2019 FINDINGS: Single frontal view of the chest demonstrates an unremarkable cardiac silhouette. No acute airspace disease, effusion, or pneumothorax. No acute fractures. Please note that the pulmonary metastases and bony metastases seen on prior CT scan are not readily apparent on this portable chest x-ray. IMPRESSION: 1. No acute intrathoracic process. 2. The intrathoracic and bony metastases seen on prior chest CT are not apparent on this portable radiograph. Electronically Signed   By: Randa Ngo M.D.   On: 07/20/2019 21:00    Procedures Procedures (including critical care time)  Medications Ordered in ED Medications  cefTRIAXone (ROCEPHIN) 1 g in sodium chloride 0.9 % 100 mL IVPB (has no administration in time range)    ED Course  I have reviewed the triage vital signs and the nursing notes.  Pertinent labs & imaging results that were available during my care of the patient were reviewed by me and considered in my medical decision making (see chart for details).    MDM Rules/Calculators/A&P                      History provided by patient with additional history obtained from chart review.  Patient seen and examined. Patient presents awake, alert, hemodynamically stable, afebrile, non toxic.  On exam patient's lungs are clear to auscultation all fields.  He has normal work of breathing.  No abdominal tenderness.  He has an indwelling Foley catheter with yellow urine seen in Foley bag at the bedside.  He does endorse dysuria so will collect UA.  Labs collected in triage.  They are notable for hyponatremia with sodium of 127, creatinine slightly decreased at 0.5, hypocalcemia 7.5 alk phos elevated at 459.  Today he has leukocytosis of 11.3, hemoglobin 11.2 is  consistent with his baseline. Patient was seen in the emergency room 2 days ago.  At that time he had hyponatremia of 125 and calcium is 7.4. Corrected calcium is 8.6.  UA shows is suggestive of UTI with moderate leukocytes and 6-10 WBC, rare bacteria.  Urine culture sent.  Will give IV Rocephin. This case was discussed with Dr. Roderic Palau who has seen the patient and agrees with plan to admit for UTI with dysuria and electrolyte abnormalities. Spoke with Dr. Nehemiah Settle with hospitalist service who agrees to assume care of patient and bring into the hospital for further evaluation and management. Appreciate admission.   Portions of this note were generated with Lobbyist. Dictation errors may occur despite best attempts at proofreading.    Final Clinical Impression(s) / ED Diagnoses Final diagnoses:  Hypocalcemia  Hyponatremia  Dysuria    Rx / DC Orders ED Discharge Orders    None       Flint Melter 07/22/19 1944    Milton Ferguson, MD 07/23/19 [phone]

## 2019-07-22 NOTE — ED Triage Notes (Signed)
EMS reports pt was here last week for dehydration. Reports has prostate cancer and is currently taking radiation treatments.  C?O generalized weakness and numbness all over.  Reports symptoms started when he woke up this morning.  Reports vss.  ETCO2 was 28, burning with urination. Reports has indwelling foley cath.

## 2019-07-22 NOTE — H&P (Signed)
History and Physical  Rapheal [name]. NAT:[id number] DOB: 1941-01-30 DOA: 07/22/2019  Referring physician: Quinn Axe, ED physician PCP: Dettinger, Fransisca Kaufmann, MD  Outpatient Specialists:   Patient Coming From: home  Chief Complaint: weakness  HPI: Keith Olson. is a 79 y.o. 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  Patient presents with a couple weeks of increasing weakness.  He has been having increasing difficulty performing his ADLs, in particular getting around and making himself food.  He has not eaten in approximately a week.  He does have family in the area, but they are not able to care for him.  No palliating or provoking factors.  He does have an indwelling catheter.  Emergency Department Course: White count 11.3, sodium 127, calcium 7.5, although corrects to 8.6.  UA evidence of UTI  Review of Systems:   Pt denies any fevers, chills, nausea, vomiting, diarrhea, constipation, abdominal pain, shortness of breath, dyspnea on exertion, orthopnea, cough, wheezing, palpitations, headache, vision changes, lightheadedness, dizziness, melena, rectal bleeding.  Review of systems are otherwise negative  Past Medical History:  Diagnosis Date  . Agent orange exposure   . Arthritis   . Coronary artery disease    BMS to mid LAD 2001, DES to proximal LAD 2017  . Enlarged prostate    XRT 2016  . Headache    Sinus headaches   . Heart abnormality   . History of depression   . Hyperlipidemia   . Hypothyroidism   . Prostate cancer ([place]) 07/2014   hormonal therapy, external beam radiation therapy  . PTSD (post-traumatic stress disorder)    Past Surgical History:  Procedure Laterality Date  . BRONCHIAL BRUSHINGS  07/02/2019   Procedure: BRONCHIAL BRUSHINGS;  Surgeon: Rigoberto Noel, MD;  Location: [facility];  Service: Cardiopulmonary;;  . BRONCHIAL NEEDLE ASPIRATION BIOPSY   07/02/2019   Procedure: BRONCHIAL NEEDLE ASPIRATION BIOPSIES;  Surgeon: Rigoberto Noel, MD;  Location: [facility];  Service: Cardiopulmonary;;  . BRONCHIAL WASHINGS  07/02/2019   Procedure: BRONCHIAL WASHINGS;  Surgeon: Rigoberto Noel, MD;  Location: [facility];  Service: Cardiopulmonary;;  . CARDIAC CATHETERIZATION N/A 12/12/2015   Procedure: Left Heart Cath and Coronary Angiography;  Surgeon: Peter M Martinique, MD;  Location: [place] [facility];  Service: Cardiovascular;  Laterality: N/A;  . CARDIAC CATHETERIZATION N/A 12/12/2015   Procedure: Coronary Stent Intervention;  Surgeon: Peter M Martinique, MD;  Location: [place] [facility];  Service: Cardiovascular;  Laterality: N/A;  . CORONARY STENT INTERVENTION N/A 09/06/2017   Procedure: CORONARY STENT INTERVENTION;  Surgeon: Burnell Blanks, MD;  Location: [place] [facility];  Service: Cardiovascular;  Laterality: N/A;  . ENDOBRONCHIAL ULTRASOUND N/A 07/02/2019   Procedure: ENDOBRONCHIAL ULTRASOUND;  Surgeon: Rigoberto Noel, MD;  Location: [facility];  Service: Cardiopulmonary;  Laterality: N/A;  . FLEXIBLE BRONCHOSCOPY  07/02/2019   Procedure: FLEXIBLE BRONCHOSCOPY;  Surgeon: Rigoberto Noel, MD;  Location: [facility];  Service: Cardiopulmonary;;  . HERNIA REPAIR Right   . LEFT HEART CATH AND CORONARY ANGIOGRAPHY N/A 09/02/2016   Procedure: Left Heart Cath and Coronary Angiography;  Surgeon: Leonie Man, MD;  Location: [place] [facility];  Service: Cardiovascular;  Laterality: N/A;  . LEFT HEART CATH AND CORONARY ANGIOGRAPHY N/A 09/06/2017   Procedure: LEFT HEART CATH AND CORONARY ANGIOGRAPHY;  Surgeon: Burnell Blanks, MD;  Location: [place] [facility];  Service: Cardiovascular;  Laterality: N/A;  .  LEFT HEART CATH AND CORONARY ANGIOGRAPHY N/A 05/04/2019   Procedure: LEFT HEART CATH AND CORONARY ANGIOGRAPHY;  Surgeon: Martinique, Peter M, MD;  Location: [place] [facility];  Service: Cardiovascular;  Laterality: N/A;  . PROSTATE BIOPSY N/A  08/28/2014   Procedure: BIOPSY TRANSRECTAL ULTRASONIC PROSTATE (TUBP);  Surgeon: Rana Snare, MD;  Location: [facility];  Service: Urology;  Laterality: N/A;  . TRANSURETHRAL RESECTION OF PROSTATE N/A 08/28/2014   Procedure: TRANSURETHRAL RESECTION OF THE PROSTATE WITH GYRUS INSTRUMENTS;  Surgeon: Rana Snare, MD;  Location: [facility];  Service: Urology;  Laterality: N/A;   Social History:  reports that he has never smoked. He has quit using smokeless tobacco.  His smokeless tobacco use included chew. He reports that he does not drink alcohol or use drugs. Patient lives at home  Allergies  Allergen Reactions  . Lipitor [Atorvastatin]     Muscles aches    Family History  Problem Relation Age of Onset  . Arthritis Mother   . Heart attack Father       Prior to Admission medications   Medication Sig Start Date End Date Taking? Authorizing Provider  ALPRAZolam Duanne Moron) 0.5 MG tablet Take 0.5 mg by mouth 2 (two) times daily as needed. 04/05/19  Yes [provider]  aspirin EC 81 MG tablet Take 1 tablet (81 mg total) by mouth daily. 12/09/15  Yes Satira Sark, MD  bicalutamide (CASODEX) 50 MG tablet Take 1 tablet (50 mg total) by mouth daily. 07/05/19  Yes Curcio, Roselie Awkward, NP  dexamethasone (DECADRON) 4 MG tablet Take 1 tablet (4 mg total) by mouth 2 (two) times daily with a meal. 07/06/19  Yes Rai, Ripudeep K, MD  diclofenac sodium (VOLTAREN) 1 % GEL APPLY 2 GRAMS TOPICALLY 4 TIMES DAILY Patient taking differently: Apply 2 g topically 4 (four) times daily as needed.  12/21/18  Yes Dettinger, Fransisca Kaufmann, MD  fentaNYL (DURAGESIC) 25 MCG/HR Place 1 patch onto the skin every 3 (three) days. 07/06/19  Yes Rai, Ripudeep K, MD  gabapentin (NEURONTIN) 400 MG capsule Take 1 capsule (400 mg total) by mouth 3 (three) times daily. 07/06/19  Yes Rai, Ripudeep K, MD  HYDROmorphone (DILAUDID) 4 MG tablet Take 1 tablet (4 mg total) by mouth every 4 (four) hours as needed for moderate pain or severe pain.  07/20/19  Yes Derek Jack, MD  meclizine (ANTIVERT) 25 MG tablet Take 1 tablet (25 mg total) by mouth 2 (two) times daily as needed for dizziness. 07/06/19  Yes Rai, Ripudeep K, MD  nitroGLYCERIN (NITROSTAT) 0.4 MG SL tablet Place 1 tablet (0.4 mg total) under the tongue every 5 (five) minutes as needed for chest pain. X 3 doses Patient taking differently: Place 0.4 mg under the tongue every 5 (five) minutes as needed for chest pain.  09/07/17  Yes Bhagat, Bhavinkumar, PA  nystatin (MYCOSTATIN) 100000 UNIT/ML suspension Take 5 mLs by mouth 4 (four) times daily. 07/19/19  Yes [provider]  pantoprazole (PROTONIX) 40 MG tablet Take 1 tablet (40 mg total) by mouth daily. 07/06/19  Yes Rai, Ripudeep K, MD  polyethylene glycol powder (GLYCOLAX/MIRALAX) 17 GM/SCOOP powder Take 17 g by mouth daily as needed for mild constipation or moderate constipation.   Yes [provider]  lidocaine (LIDODERM) 5 % Place 1 patch onto the skin daily. Remove & Discard patch within 12 hours or as directed by MD Patient not taking: Reported on 07/22/2019 07/06/19   Mendel Corning, MD    Physical Exam:  BP 115/82   Pulse 72   Temp 98.1 F (36.7 C) (Oral)   Resp 14   Ht _0  (1.727 m)   Wt 65.8 kg   SpO2 97%   BMI 22.05 kg/m   . General: Elderly male. Awake and alert and oriented x3. No acute cardiopulmonary distress.  Marland Kitchen HEENT: Normocephalic atraumatic.  Right and left ears normal in appearance.  Pupils equal, round, reactive to light. Extraocular muscles are intact. Sclerae anicteric and noninjected.  Moist mucosal membranes. No mucosal lesions.  . Neck: Neck supple without lymphadenopathy. No carotid bruits. No masses palpated.  . Cardiovascular: Regular rate with normal S1-S2 sounds. No murmurs, rubs, gallops auscultated. No JVD.  Marland Kitchen Respiratory: Good respiratory effort with no wheezes, rales, rhonchi. Lungs clear to auscultation bilaterally.  No accessory muscle use. . Abdomen: Soft,  nontender, nondistended. Active bowel sounds. No masses or hepatosplenomegaly  . Skin: No rashes, lesions, or ulcerations.  Dry, warm to touch. 2+ dorsalis pedis and radial pulses. . Musculoskeletal: No calf or leg pain. All major joints not erythematous nontender.  No upper or lower joint deformation.  Good ROM.  No contractures  . Psychiatric: Intact judgment and insight. Pleasant and cooperative. . Neurologic: No focal neurological deficits. Strength is 5/5 and symmetric in upper and lower extremities.  Cranial nerves II through XII are grossly intact.           Labs on Admission: I have personally reviewed following labs and imaging studies  CBC: Recent Labs  Lab 07/20/19 2144 07/22/19 1315  WBC 7.9 11.3*  NEUTROABS 6.3 10.0*  HGB 10.6* 11.2*  HCT 31.9* 33.8*  MCV 92.7 93.6  PLT 250 465   Basic Metabolic Panel: Recent Labs  Lab 07/20/19 2144 07/22/19 1315  NA 125* 127*  K 3.5 3.9  CL 91* 94*  CO2 25 26  GLUCOSE 119* 117*  BUN 9 8  CREATININE 0.41* 0.50*  CALCIUM 7.4* 7.5*   GFR: Estimated Creatinine Clearance: 72 mL/min (A) (by C-G formula based on SCr of 0.5 mg/dL (L)). Liver Function Tests: Recent Labs  Lab 07/20/19 2144 07/22/19 1315  AST 33 22  ALT 61* 42  ALKPHOS 374* 459*  BILITOT 0.8 0.6  PROT 5.9* 6.2*  ALBUMIN 2.5* 2.6*   No results for input(s): LIPASE, AMYLASE in the last 168 hours. No results for input(s): AMMONIA in the last 168 hours. Coagulation Profile: No results for input(s): INR, PROTIME in the last 168 hours. Cardiac Enzymes: No results for input(s): CKTOTAL, CKMB, CKMBINDEX, TROPONINI in the last 168 hours. BNP (last 3 results) No results for input(s): PROBNP in the last 8760 hours. HbA1C: No results for input(s): HGBA1C in the last 72 hours. CBG: No results for input(s): GLUCAP in the last 168 hours. Lipid Profile: No results for input(s): CHOL, HDL, LDLCALC, TRIG, CHOLHDL, LDLDIRECT in the last 72 hours. Thyroid Function  Tests: No results for input(s): TSH, T4TOTAL, FREET4, T3FREE, THYROIDAB in the last 72 hours. Anemia Panel: No results for input(s): VITAMINB12, FOLATE, FERRITIN, TIBC, IRON, RETICCTPCT in the last 72 hours. Urine analysis:    Component Value Date/Time   COLORURINE YELLOW 07/22/2019 1345   APPEARANCEUR HAZY (A) 07/22/2019 1345   APPEARANCEUR Clear 05/22/2018 1547   LABSPEC 1.002 (L) 07/22/2019 1345   PHURINE 9.0 (H) 07/22/2019 1345   GLUCOSEU NEGATIVE 07/22/2019 1345   HGBUR SMALL (A) 07/22/2019 1345   BILIRUBINUR NEGATIVE 07/22/2019 1345   BILIRUBINUR Negative 05/22/2018 [place] 07/22/2019 1345  PROTEINUR NEGATIVE 07/22/2019 1345   UROBILINOGEN negative 03/07/2014 1059   NITRITE NEGATIVE 07/22/2019 1345   LEUKOCYTESUR MODERATE (A) 07/22/2019 1345   Sepsis Labs: _0 (procalcitonin:4,lacticidven:4) ) Recent Results (from the past 240 hour(s))  SARS Coronavirus 2 by RT PCR (hospital order, performed in [facility] hospital lab) Nasopharyngeal Nasopharyngeal Swab     Status: None   Collection Time: 07/22/19  5:02 PM   Specimen: Nasopharyngeal Swab  Result Value Ref Range Status   SARS Coronavirus 2 NEGATIVE NEGATIVE Final    Comment: (NOTE) SARS-CoV-2 target nucleic acids are NOT DETECTED. The SARS-CoV-2 RNA is generally detectable in upper and lower respiratory specimens during the acute phase of infection. The lowest concentration of SARS-CoV-2 viral copies this assay can detect is 250 copies / mL. A negative result does not preclude SARS-CoV-2 infection and should not be used as the sole basis for treatment or other patient management decisions.  A negative result may occur with improper specimen collection / handling, submission of specimen other than nasopharyngeal swab, presence of viral mutation(s) within the areas targeted by this assay, and inadequate number of viral copies (<250 copies / mL). A negative result must be combined with  clinical observations, patient history, and epidemiological information. Fact Sheet for Patients:   StrictlyIdeas.no Fact Sheet for Healthcare Providers: BankingDealers.co.za This test is not yet approved or cleared  by the [place] FDA and has been authorized for detection and/or diagnosis of SARS-CoV-2 by FDA under an Emergency Use Authorization (EUA).  This EUA will remain in effect (meaning this test can be used) for the duration of the COVID-19 declaration under Section 564(b)(1) of the Act, 21 U.S.C. section 360bbb-3(b)(1), unless the authorization is terminated or revoked sooner. Performed at [facility], [address], [place], [place] [address]      Radiological Exams on Admission: DG Chest Portable 1 View  Result Date: 07/20/2019 CLINICAL DATA:  Short of breath since yesterday, weakness, history of prostate cancer EXAM: PORTABLE CHEST 1 VIEW COMPARISON:  05/03/2019, 06/27/2019 FINDINGS: Single frontal view of the chest demonstrates an unremarkable cardiac silhouette. No acute airspace disease, effusion, or pneumothorax. No acute fractures. Please note that the pulmonary metastases and bony metastases seen on prior CT scan are not readily apparent on this portable chest x-ray. IMPRESSION: 1. No acute intrathoracic process. 2. The intrathoracic and bony metastases seen on prior chest CT are not apparent on this portable radiograph. Electronically Signed   By: Randa Ngo M.D.   On: 07/20/2019 21:00    EKG: Independently reviewed.  Sinus rhythm with no acute ST changes  Assessment/Plan: Principal Problem:   Acute lower UTI Active Problems:   BPH with urinary obstruction   Prostate cancer metastatic to bone [facility])   CAD (coronary artery disease)   Hypothyroidism   Hyponatremia   Weakness   Protein calorie malnutrition ([place])    This patient was discussed with the ED physician, including pertinent vitals, physical exam  findings, labs, and imaging.  We also discussed care given by the ED provider.  1. Acute lower UTI a. Admit b. Rocephin c. Urine culture d. Repeat CBC in the morning 2. Weakness a. Secondary to malnutrition b. Dietary consult c. Protein supplement between meals d. Check TSH (patient not currently on treatment for hypothyroidism) e. Vitamin B12 3. Hyponatremia a. Likely secondary to malnutrition 4. Hypothyroidism a. Check TSH 5. Protein calorie malnutrition a. Consult 6. Prostate cancer 7. BPH with urinary obstruction  DVT prophylaxis: Lovenox Consultants: None Code Status: DNR  Family Communication: Sister present Disposition Plan: Pending   Truett Mainland, DO

## 2019-07-23 ENCOUNTER — Encounter (HOSPITAL_COMMUNITY): Payer: Medicare Other

## 2019-07-23 LAB — COMPREHENSIVE METABOLIC PANEL
ALT: 33 U/L (ref 0–44)
AST: 17 U/L (ref 15–41)
Albumin: 2.3 g/dL — ABNORMAL LOW (ref 3.5–5.0)
Alkaline Phosphatase: 414 U/L — ABNORMAL HIGH (ref 38–126)
Anion gap: 8 (ref 5–15)
BUN: 7 mg/dL — ABNORMAL LOW (ref 8–23)
CO2: 24 mmol/L (ref 22–32)
Calcium: 7.4 mg/dL — ABNORMAL LOW (ref 8.9–10.3)
Chloride: 99 mmol/L (ref 98–111)
Creatinine, Ser: 0.55 mg/dL — ABNORMAL LOW (ref 0.61–1.24)
GFR calc Af Amer: >60 mL/min (ref >60)
GFR calc non Af Amer: >60 mL/min (ref >60)
Glucose, Bld: 91 mg/dL (ref 70–99)
Potassium: 3.7 mmol/L (ref 3.5–5.1)
Sodium: 131 mmol/L — ABNORMAL LOW (ref 135–145)
Total Bilirubin: 0.5 mg/dL (ref 0.3–1.2)
Total Protein: 5.5 g/dL — ABNORMAL LOW (ref 6.5–8.1)

## 2019-07-23 LAB — CBC
HCT: 31.9 % — ABNORMAL LOW (ref 39.0–52.0)
Hemoglobin: 10.5 g/dL — ABNORMAL LOW (ref 13.0–17.0)
MCH: 31.2 pg (ref 26.0–34.0)
MCHC: 32.9 g/dL (ref 30.0–36.0)
MCV: 94.7 fL (ref 80.0–100.0)
Platelets: 313 10*3/uL (ref 150–400)
RBC: 3.37 MIL/uL — ABNORMAL LOW (ref 4.22–5.81)
RDW: 15.3 % (ref 11.5–15.5)
WBC: 6.3 10*3/uL (ref 4.0–10.5)
nRBC: 0 % (ref 0.0–0.2)

## 2019-07-23 LAB — VITAMIN B12: Vitamin B-12: 775 pg/mL (ref 180–914)

## 2019-07-23 LAB — TSH: TSH: 1.84 u[IU]/mL (ref 0.350–4.500)

## 2019-07-23 MED ORDER — ENSURE ENLIVE PO LIQD
237.0000 mL | Freq: Three times a day (TID) | ORAL | Status: DC
  Administered 2019-07-23 – 2019-07-25 (×5): 237 mL via ORAL

## 2019-07-23 NOTE — Progress Notes (Signed)
PROGRESS NOTE    Keith Olson.  FUX:[id number] DOB: April 17, 1940 DOA: 07/22/2019 PCP: Dettinger, Fransisca Kaufmann, MD   Brief Narrative:  Per HPI: Keith Lashan Gluth. is a 79 y.o. 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  Patient presents with a couple weeks of increasing weakness.  He has been having increasing difficulty performing his ADLs, in particular getting around and making himself food.  He has not eaten in approximately a week.  He does have family in the area, but they are not able to care for him.  No palliating or provoking factors.  He does have an indwelling catheter.  -Patient has been admitted with worsening weakness and falls at home along with malnutrition and some hyponatremia.  He is noted to have a UTI with urine cultures pending has been started on Rocephin.  Assessment & Plan:   Principal Problem:   Acute lower UTI Active Problems:   BPH with urinary obstruction   Prostate cancer metastatic to bone ([facility])   CAD (coronary artery disease)   Hypothyroidism   Hyponatremia   Weakness   Protein calorie malnutrition ([facility])   Generalized weakness-multifactorial -Related to worsening nutritional status as well as UTI -PT evaluation pending with need for placement noted as he cannot care for himself at home and wife has Alzheimer's -TSH and B12 within normal limits  UTI -Urine culture pending -Maintain on Rocephin empirically  Hyponatremia -Likely related to poor oral intake, but is improving on IV fluid -Continue monitoring repeat labs  Hypothyroidism -TSH within normal limits -Does not currently appear to be on levothyroxine  Protein calorie malnutrition -Appreciate dietary consultation  Metastatic prostate cancer  -Patient has completed prior treatment  BPH with chronic obstruction -Indwelling Foley catheter -He looks to have had it placed approximately 4 weeks  ago and will require exchange given his ongoing infection.   DVT prophylaxis: Lovenox Code Status: DNR Family Communication: None at bedside Disposition Plan: Continue current treatment for UTI with PT evaluation pending and placement. Status is: Inpatient  Remains inpatient appropriate because:Unsafe d/c plan   Dispo: The patient is from: Home              Anticipated d/c is to: SNF              Anticipated d/c date is: 2 days              Patient currently is not medically stable to d/c.   Consultants:   None  Procedures:   None  Antimicrobials:  Anti-infectives (From admission, onward)   Start     Dose/Rate Route Frequency Ordered Stop   07/23/19 0000  cefTRIAXone (ROCEPHIN) 1 g in sodium chloride 0.9 % 100 mL IVPB     1 g 200 mL/hr over 30 Minutes Intravenous Every 24 hours 07/22/19 2011     07/22/19 1715  cefTRIAXone (ROCEPHIN) 1 g in sodium chloride 0.9 % 100 mL IVPB     1 g 200 mL/hr over 30 Minutes Intravenous  Once 07/22/19 1657 07/22/19 1854      Subjective: Patient seen and evaluated today with no new acute complaints or concerns. No acute concerns or events noted overnight.  Objective: Vitals:   07/22/19 1955 07/22/19 2358 07/23/19 0401 07/23/19 0924  BP: 139/71 135/65 (!) 147/72 135/69  Pulse: 62 71 63 63  Resp: 18 18 18 20   Temp: 98.3 F (36.8 C) 98.6 F (37 C)  98.4 F (36.9 C) 98 F (36.7 C)  TempSrc: Oral Oral Oral   SpO2: 94% 94% 96% 96%  Weight: 65.9 kg     Height: 5\' 7"  (1.702 m)       Intake/Output Summary (Last 24 hours) at 07/23/2019 1153 Last data filed at 07/23/2019 0649 Gross per 24 hour  Intake 1014.31 ml  Output 1245 ml  Net -230.69 ml   Filed Weights   07/22/19 1304 07/22/19 1955  Weight: 65.8 kg 65.9 kg    Examination:  General exam: Appears calm and comfortable  Respiratory system: Clear to auscultation. Respiratory effort normal. Cardiovascular system: S1 & S2 heard, RRR. No JVD, murmurs, rubs, gallops or clicks.  No pedal edema. Gastrointestinal system: Abdomen is nondistended, soft and nontender. No organomegaly or masses felt. Normal bowel sounds heard. Central nervous system: Alert and oriented. No focal neurological deficits. Extremities: Symmetric 5 x 5 power. Skin: No rashes, lesions or ulcers Psychiatry: Judgement and insight appear normal. Mood & affect appropriate.  Indwelling Foley catheter with clear, yellow urine output noted.    Data Reviewed: I have personally reviewed following labs and imaging studies  CBC: Recent Labs  Lab 07/20/19 2144 07/22/19 1315 07/23/19 0551  WBC 7.9 11.3* 6.3  NEUTROABS 6.3 10.0*  --   HGB 10.6* 11.2* 10.5*  HCT 31.9* 33.8* 31.9*  MCV 92.7 93.6 94.7  PLT 250 275 299   Basic Metabolic Panel: Recent Labs  Lab 07/20/19 2144 07/22/19 1315 07/23/19 0551  NA 125* 127* 131*  K 3.5 3.9 3.7  CL 91* 94* 99  CO2 25 26 24   GLUCOSE 119* 117* 91  BUN 9 8 7*  CREATININE 0.41* 0.50* 0.55*  CALCIUM 7.4* 7.5* 7.4*   GFR: Estimated Creatinine Clearance: 72.1 mL/min (A) (by C-G formula based on SCr of 0.55 mg/dL (L)). Liver Function Tests: Recent Labs  Lab 07/20/19 2144 07/22/19 1315 07/23/19 0551  AST 33 22 17  ALT 61* 42 33  ALKPHOS 374* 459* 414*  BILITOT 0.8 0.6 0.5  PROT 5.9* 6.2* 5.5*  ALBUMIN 2.5* 2.6* 2.3*   No results for input(s): LIPASE, AMYLASE in the last 168 hours. No results for input(s): AMMONIA in the last 168 hours. Coagulation Profile: No results for input(s): INR, PROTIME in the last 168 hours. Cardiac Enzymes: No results for input(s): CKTOTAL, CKMB, CKMBINDEX, TROPONINI in the last 168 hours. BNP (last 3 results) No results for input(s): PROBNP in the last 8760 hours. HbA1C: No results for input(s): HGBA1C in the last 72 hours. CBG: No results for input(s): GLUCAP in the last 168 hours. Lipid Profile: No results for input(s): CHOL, HDL, LDLCALC, TRIG, CHOLHDL, LDLDIRECT in the last 72 hours. Thyroid Function  Tests: Recent Labs    07/23/19 0551  TSH 1.840   Anemia Panel: Recent Labs    07/23/19 0551  VITAMINB12 775   Sepsis Labs: No results for input(s): PROCALCITON, LATICACIDVEN in the last 168 hours.  Recent Results (from the past 240 hour(s))  SARS Coronavirus 2 by RT PCR (hospital order, performed in [facility] hospital lab) Nasopharyngeal Nasopharyngeal Swab     Status: None   Collection Time: 07/22/19  5:02 PM   Specimen: Nasopharyngeal Swab  Result Value Ref Range Status   SARS Coronavirus 2 NEGATIVE NEGATIVE Final    Comment: (NOTE) SARS-CoV-2 target nucleic acids are NOT DETECTED. The SARS-CoV-2 RNA is generally detectable in upper and lower respiratory specimens during the acute phase of infection. The lowest concentration of SARS-CoV-2 viral copies  this assay can detect is 250 copies / mL. A negative result does not preclude SARS-CoV-2 infection and should not be used as the sole basis for treatment or other patient management decisions.  A negative result may occur with improper specimen collection / handling, submission of specimen other than nasopharyngeal swab, presence of viral mutation(s) within the areas targeted by this assay, and inadequate number of viral copies (<250 copies / mL). A negative result must be combined with clinical observations, patient history, and epidemiological information. Fact Sheet for Patients:   StrictlyIdeas.no Fact Sheet for Healthcare Providers: BankingDealers.co.za This test is not yet approved or cleared  by the [place] FDA and has been authorized for detection and/or diagnosis of SARS-CoV-2 by FDA under an Emergency Use Authorization (EUA).  This EUA will remain in effect (meaning this test can be used) for the duration of the COVID-19 declaration under Section 564(b)(1) of the Act, 21 U.S.C. section 360bbb-3(b)(1), unless the authorization is terminated or revoked  sooner. Performed at [facility], [address] [place], [place] [address]          Radiology Studies: No results found.      Scheduled Meds: . aspirin EC  81 mg Oral Daily  . bicalutamide  50 mg Oral Daily  . dexamethasone  4 mg Oral BID WC  . enoxaparin (LOVENOX) injection  40 mg Subcutaneous Q24H  . feeding supplement (ENSURE ENLIVE)  237 mL Oral BID BM  . folic acid  1 mg Oral Daily  . gabapentin  400 mg Oral TID  . pantoprazole  40 mg Oral Daily  . thiamine  100 mg Oral Daily   Continuous Infusions: . sodium chloride 75 mL/hr at 07/23/19 1059  . cefTRIAXone (ROCEPHIN)  IV 1 g (07/23/19 0059)     LOS: 1 day    Time spent: 35 minutes    Mylissa Lambe D Manuella Ghazi, DO [facility]  If 7PM-7AM, please contact night-coverage [email or web address] 07/23/2019, 11:53 AM

## 2019-07-23 NOTE — Progress Notes (Signed)
Re: Keith Olson    DOB: [birth year] Date: 07/23/2019  Must ID: [id number]  To Whom it May Concern:  Please be advised that the above named patient will require a short-term nursing home stay- anticipated 30 days or less rehabilitation and strengthening. The plan is for return home.

## 2019-07-23 NOTE — NC FL2 (Signed)
[place] LEVEL OF CARE SCREENING TOOL     IDENTIFICATION  Patient Name: Keith Olson. Birthdate: March 08, 1940 Sex: male Admission Date (Current Location): 07/22/2019  [place] and [place] Number:  [employer] and Address:  [facility] [address] [place]      Provider Number: [id number]  Attending Physician Name and Address:  Rodena Goldmann, DO  Relative Name and Phone Number:       Current Level of Care: Hospital Recommended Level of Care: [place] Prior Approval Number:    Date Approved/Denied:   PASRR Number: pending  Discharge Plan: SNF    Current Diagnoses: Patient Active Problem List   Diagnosis Date Noted  . Weakness 07/22/2019  . Acute lower UTI 07/22/2019  . Protein calorie malnutrition ([place]) 07/22/2019  . Urinary retention 07/18/2019  . Hyponatremia 06/29/2019  . Acute bilateral low back pain with sciatica   . Multiple lung nodules on CT   . 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 Unstable angina ([place])   . History of depression 09/05/2017  . Hypothyroidism 09/05/2017  . Depression, major, single episode, moderate ([place]) 11/26/2016  . Prostatitis 11/26/2016  . Rectal bleeding 02/25/2016  . Pain management contract signed 01/27/2016  . Constipation 04/22/2015  . Lower abdominal pain 04/22/2015  . Anxiety state 12/16/2014  . 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 Hyperlipidemia 05/22/2012  . BPH (benign prostatic hyperplasia) 05/22/2012  . Chest pain 02/24/2012  . CAD (coronary artery disease) 02/24/2012    Orientation RESPIRATION BLADDER Height & Weight     Self, Time, Situation, Place  Normal Continent Weight: 145 lb 4.5 oz (65.9 kg) Height:  5\' 7"  (170.2 cm)  BEHAVIORAL SYMPTOMS/MOOD NEUROLOGICAL BOWEL NUTRITION STATUS      Continent Diet(Regular diet. See  discharge summary.)  AMBULATORY STATUS COMMUNICATION OF NEEDS Skin   Limited Assist Verbally Other (Comment)(wound to bilateral buttocks with foam dressing.)                       Personal Care Assistance Level of Assistance  Bathing, Dressing, Feeding Bathing Assistance: Limited assistance Feeding assistance: Limited assistance Dressing Assistance: Limited assistance     Functional Limitations Info  Sight, Hearing, Speech Sight Info: Impaired Hearing Info: Adequate Speech Info: Adequate    SPECIAL CARE FACTORS FREQUENCY  PT (By licensed PT)     PT Frequency: daily              Contractures Contractures Info: Not present    Additional Factors Info  Code Status, Allergies, Psychotropic Code Status Info: DNR Allergies Info: Lipitor (Atorvastatin) Psychotropic Info: Xanax         Current Medications (07/23/2019):  This is the current hospital active medication list Current Facility-Administered Medications  Medication Dose Route Frequency Provider Last Rate Last Admin  . 0.9 %  sodium chloride infusion   Intravenous Continuous Truett Mainland, DO 75 mL/hr at 07/23/19 1059 New Bag at 07/23/19 1059  . ALPRAZolam Duanne Moron) tablet 0.5 mg  0.5 mg Oral BID PRN Truett Mainland, DO      . aspirin EC tablet 81 mg  81 mg Oral Daily Truett Mainland, DO   81 mg at 07/23/19 0973  . bicalutamide (CASODEX) tablet 50 mg  50 mg Oral Daily Truett Mainland, DO   50 mg at 07/23/19 5329  .  cefTRIAXone (ROCEPHIN) 1 g in sodium chloride 0.9 % 100 mL IVPB  1 g Intravenous Q24H Truett Mainland, DO 200 mL/hr at 07/23/19 0059 1 g at 07/23/19 0059  . dexamethasone (DECADRON) tablet 4 mg  4 mg Oral BID WC Truett Mainland, DO   4 mg at 07/23/19 7893  . diclofenac sodium (VOLTAREN) 1 % transdermal gel 2 g  2 g Topical QID PRN Truett Mainland, DO      . enoxaparin (LOVENOX) injection 40 mg  40 mg Subcutaneous Q24H Truett Mainland, DO   40 mg at 07/22/19 2232  . feeding supplement (ENSURE  ENLIVE) (ENSURE ENLIVE) liquid 237 mL  237 mL Oral TID BM Shah, Pratik D, DO      . folic acid (FOLVITE) tablet 1 mg  1 mg Oral Daily Truett Mainland, DO   1 mg at 07/23/19 0908  . gabapentin (NEURONTIN) capsule 400 mg  400 mg Oral TID Truett Mainland, DO   400 mg at 07/23/19 8101  . HYDROmorphone (DILAUDID) tablet 4 mg  4 mg Oral Q4H PRN Truett Mainland, DO   4 mg at 07/23/19 7510  . meclizine (ANTIVERT) tablet 25 mg  25 mg Oral BID PRN Truett Mainland, DO      . ondansetron [facility]) tablet 4 mg  4 mg Oral Q6H PRN Truett Mainland, DO       Or  . ondansetron [facility]) injection 4 mg  4 mg Intravenous Q6H PRN Truett Mainland, DO      . pantoprazole (PROTONIX) EC tablet 40 mg  40 mg Oral Daily Truett Mainland, DO   40 mg at 07/23/19 0908  . polyethylene glycol (MIRALAX / GLYCOLAX) packet 17 g  17 g Oral Daily PRN Truett Mainland, DO      . thiamine tablet 100 mg  100 mg Oral Daily Truett Mainland, DO   100 mg at 07/23/19 0908     Discharge Medications: Please see discharge summary for a list of discharge medications.  Relevant Imaging Results:  Relevant Lab Results:   Additional Information SSN: [id number]  Salome Arnt, LCSW

## 2019-07-23 NOTE — TOC Initial Note (Signed)
Transition of Care (TOC) - Initial/Assessment Note    Patient Details  Name: Keith Olson. MRN: [id number] Date of Birth: 09-15-1940  Transition of Care [facility]) CM/SW Contact:    Salome Arnt, LCSW Phone Number: 07/23/2019, 2:37 PM  Clinical Narrative:  LCSW met with pt and pt's sister (visiting from [facility]) at bedside. Pt alert and oriented and reports he lives with his wife and 2 adult children. Pt's wife has Alzheimer's and is currently staying with her son, Darnelle Maffucci at his home. Pt states he was fairly independent with ADLs and was primary caregiver for his wife until about a week ago when he became very weak. He has been using a walker since then. Pt has metastatic prostate cancer. Pt reports his 34 year old son and 84 year old daughter live in the home and do not work and are both drug addicts. He has been providing additional money to them, but states he is not going to continue to do this. He reports he has no plans of putting them out of the house. Pt has no concerns regarding safety at this time. MD concerned about need for APS report. LCSW spoke with APS who state that as long as pt is oriented and choosing to allow his children to live in the home, there is nothing they can do. Discussed d/c plan with pt. He feels he will need short term SNF prior to returning home. Awaiting PT evaluation. LCSW will continue to follow to address discharge planning needs.              Expected Discharge Plan: Skilled Nursing Facility Barriers to Discharge: Continued Medical Work up   Patient Goals and CMS Choice Patient states their goals for this hospitalization and ongoing recovery are:: Short-term SNF CMS Medicare.gov Compare Post Acute Care list provided to:: Patient Choice offered to / list presented to : Patient  Expected Discharge Plan and Services Expected Discharge Plan: [place] In-house Referral: Clinical Social Work   Post Acute Care Choice: [place] Living arrangements for the past 2 months: [place]                                      Prior Living Arrangements/Services Living arrangements for the past 2 months: Single Family Home Lives with:: Spouse, Adult Children Patient language and need for interpreter reviewed:: Yes Do you feel safe going back to the place where you live?: Yes      Need for Family Participation in Patient Care: Yes (Comment) Care giver support system in place?: Yes (comment) Current home services: DME(walker) Criminal Activity/Legal Involvement Pertinent to Current Situation/Hospitalization: No - Comment as needed  Activities of Daily Living Home Assistive Devices/Equipment: Gilford Rile (specify type) ADL Screening (condition at time of admission) Patient's cognitive ability adequate to safely complete daily activities?: Yes Is the patient deaf or have difficulty hearing?: No Does the patient have difficulty seeing, even when wearing glasses/contacts?: No Does the patient have difficulty concentrating, remembering, or making decisions?: No Patient able to express need for assistance with ADLs?: Yes Does the patient have difficulty dressing or bathing?: No Independently performs ADLs?: Yes (appropriate for developmental age) Does the patient have difficulty walking or climbing stairs?: Yes Weakness of Legs: Both Weakness of Arms/Hands: None  Permission Sought/Granted                  Emotional  Assessment Appearance:: Appears stated age Attitude/Demeanor/Rapport: Engaged Affect (typically observed): Accepting Orientation: : Oriented to Self, Oriented to Place, Oriented to  Time, Oriented to Situation Alcohol / Substance Use: Not Applicable Psych Involvement: (PCP manages)  Admission diagnosis:  Hypocalcemia [E83.51] Dysuria [R30.0] Hyponatremia [E87.1] Weakness [R53.1] Patient Active Problem List   Diagnosis Date Noted  . Weakness 07/22/2019  . Acute lower UTI  07/22/2019  . Protein calorie malnutrition ([place]) 07/22/2019  . Urinary retention 07/18/2019  . Hyponatremia 06/29/2019  . Acute bilateral low back pain with sciatica   . Multiple lung nodules on CT   . 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 Unstable angina ([place])   . History of depression 09/05/2017  . Hypothyroidism 09/05/2017  . Depression, major, single episode, moderate ([place]) 11/26/2016  . Prostatitis 11/26/2016  . Rectal bleeding 02/25/2016  . Pain management contract signed 01/27/2016  . Constipation 04/22/2015  . Lower abdominal pain 04/22/2015  . Anxiety state 12/16/2014  . 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 Hyperlipidemia 05/22/2012  . BPH (benign prostatic hyperplasia) 05/22/2012  . Chest pain 02/24/2012  . CAD (coronary artery disease) 02/24/2012   PCP:  Dettinger, Fransisca Kaufmann, MD Pharmacy:   [facility] [address], [place] - [address] [place] [address] Phone: [phone] Fax: [phone]  [place], [place] - [address] [facility]. [address] [place] [address] Phone: [phone] Fax: [phone]     Social Determinants of Health (SDOH) Interventions    Readmission Risk Interventions Readmission Risk Prevention Plan 07/23/2019  Transportation Screening Complete  PCP or Specialist Appt within 3-5 Days Not Complete  HRI or [place] Complete  Social Work Consult for [place] Planning/Counseling Complete  Palliative Care Screening Not Applicable  Medication Review Press photographer) Complete  Some recent data might be hidden

## 2019-07-23 NOTE — Progress Notes (Addendum)
Initial Nutrition Assessment  DOCUMENTATION CODES:      INTERVENTION:  Ensure Enlive po BID, each supplement provides 350 kcal and 20 grams of protein   ProStat 30 ml TID (each 30 ml provides 100 kcal, 15 gr protein)    NUTRITION DIAGNOSIS:   Inadequate oral intake related to decreased appetite as evidenced by percent weight loss, per patient/family report.   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 Presents from home with acute lower UTI, hyponatremia and increased weakness. Has an indwelling catheter.   Intake/Output Summary (Last 24 hours) at 07/23/2019 1159 Last data filed at 07/23/2019 0649 Gross per 24 hour  Intake 1014.31 ml  Output 1245 ml  Net -230.69 ml  Total intake-1014.3 ml Total output-1245 ml  Regular diet provide and meal intake-appetite is poor per pt. He was able to eat most of his eggs this morning but did not eat yesterday. Patient has consumed ~50% of an Ensure and encouraged him to complete to assist with meeting his est nutrition needs. Emphasized the importance of daily protein and adequate energy to support lean muscle mass.    Weight history:  07/22/19- 65.9 kg 07/02/19-65.8 kg 05/04/19-67.4 kg 11/13/18-69 kg 05/22/18-73.4 kg Loss of ~10% over the past 14 months.  Medications reviewed and include: casodex, decadron, folic acid, protonix and thiamine. IV-NS@ 75 ml/hr and Rocephin.  Labs reviewed: Sodium 131 (L) improved from 127 yesterday. Abnormal LFT's. Hemoglobin 10.5 (L). Vit. B12 -775 (wnl).  NUTRITION - FOCUSED PHYSICAL EXAM: Nutrition-Focused physical exam findings are mild fat depletion orbital and buccal, mild clavicle, deltiod muscle depletion, and no edema.    Diet Order:   Diet Order           Diet regular Room service appropriate? Yes; Fluid consistency: Thin  Diet effective now              EDUCATION NEEDS:  Education needs have been addressed   Skin:  Skin Assessment: Reviewed RN Assessment(MASD to sacrum)  Last BM:  5/23  Height:   Ht Readings from Last 1 Encounters:  07/22/19 5\' 7"  (1.702 m)    Weight:   Wt Readings from Last 1 Encounters:  07/22/19 65.9 kg    Ideal Body Weight:   67 kg  BMI:  Body mass index is 22.75 kg/m.  Estimated Nutritional Needs:   Kcal:  8343-7357  Protein:  86-92 gr  Fluid:  2.0-2.2 liters daily   Colman Cater MS,RD,CSG,LDN Pager: [phone]

## 2019-07-24 ENCOUNTER — Inpatient Hospital Stay: Payer: Medicare Other | Attending: Radiation Oncology

## 2019-07-24 LAB — CBC
HCT: 34.3 % — ABNORMAL LOW (ref 39.0–52.0)
Hemoglobin: 11.4 g/dL — ABNORMAL LOW (ref 13.0–17.0)
MCH: 31.1 pg (ref 26.0–34.0)
MCHC: 33.2 g/dL (ref 30.0–36.0)
MCV: 93.5 fL (ref 80.0–100.0)
Platelets: 393 10*3/uL (ref 150–400)
RBC: 3.67 MIL/uL — ABNORMAL LOW (ref 4.22–5.81)
RDW: 14.9 % (ref 11.5–15.5)
WBC: 7.5 10*3/uL (ref 4.0–10.5)
nRBC: 0 % (ref 0.0–0.2)

## 2019-07-24 LAB — BASIC METABOLIC PANEL
Anion gap: 8 (ref 5–15)
BUN: 10 mg/dL (ref 8–23)
CO2: 26 mmol/L (ref 22–32)
Calcium: 7.8 mg/dL — ABNORMAL LOW (ref 8.9–10.3)
Chloride: 98 mmol/L (ref 98–111)
Creatinine, Ser: 0.44 mg/dL — ABNORMAL LOW (ref 0.61–1.24)
GFR calc Af Amer: >60 mL/min (ref >60)
GFR calc non Af Amer: >60 mL/min (ref >60)
Glucose, Bld: 117 mg/dL — ABNORMAL HIGH (ref 70–99)
Potassium: 3.9 mmol/L (ref 3.5–5.1)
Sodium: 132 mmol/L — ABNORMAL LOW (ref 135–145)

## 2019-07-24 NOTE — Evaluation (Signed)
Physical Therapy Evaluation Patient Details Name: Keith Olson. MRN: [id number] DOB: 23-Jun-1940 Today's Date: 07/24/2019   History of Present Illness  Keith Olson. is a 79 y.o. 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  Patient presents with a couple weeks of increasing weakness.  He has been having increasing difficulty performing his ADLs, in particular getting around and making himself food.  He has not eaten in approximately a week.  He does have family in the area, but they are not able to care for him.  No palliating or provoking factors.  Clinical Impression  Pt admitted with above diagnosis. Pt requires min assist to power up with cues for hand placement and min assist for ambulation with RW due to inconsistent step length, steadying assistance and tactile cues to stop and reposition self within RW frame to improve safety. Pt slightly impulsive requiring cues for attention to task. Good dorsiflexion noted and no near falls, but occasional ataxic-like R leg step progression without near falls. Pt currently with functional limitations due to the deficits listed below (see PT Problem List). Pt will benefit from skilled PT to increase their independence and safety with mobility to allow discharge to the venue listed below.       Follow Up Recommendations SNF;Supervision/Assistance - 24 hour    Equipment Recommendations  None recommended by PT    Recommendations for Other Services       Precautions / Restrictions Precautions Precautions: Fall Restrictions Weight Bearing Restrictions: No      Mobility  Bed Mobility  General bed mobility comments: sitting at EOB eating breakfast upon arrival  Transfers Overall transfer level: Needs assistance Equipment used: Rolling walker (2 wheeled) Transfers: Sit to/from [facility] Sit to Stand: Min assist Stand pivot  transfers: Min assist  General transfer comment: min assist to power up, cues to push from seated surface, slightly impulsive requiring cues for safe speed  Ambulation/Gait Ambulation/Gait assistance: Min assist Gait Distance (Feet): 60 Feet Assistive device: Rolling walker (2 wheeled) Gait Pattern/deviations: Step-through pattern;Decreased stride length Gait velocity: decreased  General Gait Details: slightly unsteady gait with varying step lengths, cues for RW management to maintain close to body, occasional ataxic-like R leg progression, increased time with turning, no near falls or loss of balance  Stairs            Wheelchair Mobility    Modified Rankin (Stroke Patients Only)       Balance Overall balance assessment: Needs assistance Sitting-balance support: Feet supported;No upper extremity supported Sitting balance-Leahy Scale: Good Sitting balance - Comments: seated EOB   Standing balance support: During functional activity;Bilateral upper extremity supported Standing balance-Leahy Scale: Fair Standing balance comment: with RW          Pertinent Vitals/Pain Pain Assessment: No/denies pain    Home Living Family/patient expects to be discharged to:: Skilled nursing facility Living Arrangements: Children Available Help at Discharge: Family  Home Equipment: Gilford Rile - 2 wheels      Prior Function Level of Independence: Independent with assistive device(s)     Comments: Pt reports independent with ADLs and ambulating household distance with RW until ~2 weeks ago requiring more assistance. Pt reports son provides transportation and daughter is at home with pt 24/7; pt's spouse with Alzheimer's is now living with another son who is caring for her.     Hand Dominance        Extremity/Trunk Assessment  Upper Extremity Assessment Upper Extremity Assessment: Overall WFL for tasks assessed    Lower Extremity Assessment Lower Extremity Assessment:  Generalized weakness;RLE deficits/detail(BLE AROM 100%, MMT 3/5, grossly 3+/5 with ambulation, reports numbness/tingling throughout bil feet) RLE Sensation: decreased light touch;decreased proprioception    Cervical / Trunk Assessment Cervical / Trunk Assessment: Normal  Communication   Communication: No difficulties  Cognition Arousal/Alertness: Awake/alert Behavior During Therapy: WFL for tasks assessed/performed Overall Cognitive Status: Within Functional Limits for tasks assessed       General Comments      Exercises     Assessment/Plan    PT Assessment Patient needs continued PT services  PT Problem List Decreased strength;Decreased activity tolerance;Decreased balance;Decreased coordination;Decreased knowledge of use of DME;Decreased safety awareness;Impaired sensation       PT Treatment Interventions DME instruction;Gait training;Functional mobility training;Therapeutic activities;Therapeutic exercise;Balance training;Neuromuscular re-education;Patient/family education    PT Goals (Current goals can be found in the Care Plan section)  Acute Rehab PT Goals Patient Stated Goal: rehab then home PT Goal Formulation: With patient Time For Goal Achievement: 08/07/19 Potential to Achieve Goals: Good    Frequency Min 2X/week   Barriers to discharge        Co-evaluation               AM-PAC PT "6 Clicks" Mobility  Outcome Measure Help needed turning from your back to your side while in a flat bed without using bedrails?: A Little Help needed moving from lying on your back to sitting on the side of a flat bed without using bedrails?: A Little Help needed moving to and from a bed to a chair (including a wheelchair)?: A Little Help needed standing up from a chair using your arms (e.g., wheelchair or bedside chair)?: A Little Help needed to walk in hospital room?: A Little Help needed climbing 3-5 steps with a railing? : A Lot 6 Click Score: 17    End of Session  Equipment Utilized During Treatment: Gait belt Activity Tolerance: Patient tolerated treatment well Patient left: in chair;with call bell/phone within reach;with chair alarm set Nurse Communication: Mobility status PT Visit Diagnosis: Unsteadiness on feet (R26.81);Other abnormalities of gait and mobility (R26.89);Muscle weakness (generalized) (M62.81)    Time: 1275-1700 PT Time Calculation (min) (ACUTE ONLY): 20 min   Charges:   PT Evaluation $PT Eval Low Complexity: 1 Low PT Treatments $Gait Training: 8-22 mins         Tori Kemoni Quesenberry PT, DPT 07/24/19, 11:17 AM [phone]

## 2019-07-24 NOTE — Progress Notes (Signed)
PROGRESS NOTE    [name].  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 Patient presents with a couple weeks of increasing weakness.He has been having increasing difficulty performing his ADLs, in particular getting around and making himself food. He has not eaten in approximately a week. He does have family in the area, but they are not able to care for him. No palliating or provoking factors.  He does have an indwelling catheter.  -Patient has been admitted with worsening weakness and falls at home along with malnutrition and some hyponatremia.  He is noted to have staph aureus UTI.  This is a catheter associated UTI from his indwelling catheter that he had at home.  His Foley has been removed on 5/24 and he has been voiding without any difficulty.  He continues to remain on Rocephin with improvement in symptoms.  Sensitivities are pending.  Plans are for SNF on discharge likely in a.m. once insurance authorization performed.   Assessment & Plan:   Principal Problem:   Acute lower UTI Active Problems:   BPH with urinary obstruction   Prostate cancer metastatic to bone (HCC)   CAD (coronary artery disease)   Hypothyroidism   Hyponatremia   Weakness   Protein calorie malnutrition (HCC)   Generalized weakness-multifactorial -Related to worsening nutritional status as well as UTI -PT evaluation with need for SNF noted -TSH and B12 within normal limits  Staph aureus UTI -Likely catheter associated from home -Urine sensitivities pending -Maintain on Rocephin empirically  Hyponatremia -Likely related to poor oral intake, but is improving on IV fluid -Continue monitoring repeat labs  Hypothyroidism -TSH within normal limits -Does not  currently appear to be on levothyroxine  Protein calorie malnutrition -Appreciate dietary consultation with Protostat 3 times daily and Ensure Enlive initiated  Metastatic prostate cancer  -Patient has completed prior treatment  BPH with chronic obstruction -Indwelling Foley catheter removed 5/24 with good urine output noted -No further need for catheter at this time   DVT prophylaxis: Lovenox Code Status: DNR Family Communication:  Discussed with sister at bedside 5/24 Disposition Plan: Continue current treatment for UTI with PT evaluation pending and placement. Status is: Inpatient  Remains inpatient appropriate because:Unsafe d/c plan   Dispo: The patient is from: Home  Anticipated d/c is to: SNF  Anticipated d/c date is: 1 days  Patient currently is not medically stable to d/c.  He needs ongoing treatment with IV antibiotics and evaluation of sensitivities prior to discharge.   Consultants:   None  Procedures:   None  Antimicrobials:  Anti-infectives (From admission, onward)   Start     Dose/Rate Route Frequency Ordered Stop   07/23/19 0000  cefTRIAXone (ROCEPHIN) 1 g in sodium chloride 0.9 % 100 mL IVPB     1 g 200 mL/hr over 30 Minutes Intravenous Every 24 hours 07/22/19 2011     07/22/19 1715  cefTRIAXone (ROCEPHIN) 1 g in sodium chloride 0.9 % 100 mL IVPB     1 g 200 mL/hr over 30 Minutes Intravenous  Once 07/22/19 1657 07/22/19 1854       Subjective: Patient seen and evaluated today with no new acute complaints or concerns. No acute concerns or events noted overnight.  He appears to be voiding well without the use of his Foley catheter.  Objective: Vitals:   07/23/19 1958 07/23/19 2018 07/23/19 2107 07/24/19 0432  BP: (!) 167/82  (!) 161/83 (!) 164/84  Pulse: 66  76 63  Resp: 20  18 20   Temp: 98.1 F (36.7 C)  98.3 F (36.8 C) 97.7 F (36.5 C)  TempSrc: Oral   Oral  SpO2: 97% 94% 94% 92%  Weight:       Height:        Intake/Output Summary (Last 24 hours) at 07/24/2019 1641 Last data filed at 07/24/2019 1458 Gross per 24 hour  Intake 2324.91 ml  Output 2350 ml  Net -25.09 ml   Filed Weights   07/22/19 1304 07/22/19 1955  Weight: 65.8 kg 65.9 kg    Examination:  General exam: Appears calm and comfortable  Respiratory system: Clear to auscultation. Respiratory effort normal. Cardiovascular system: S1 & S2 heard, RRR. No JVD, murmurs, rubs, gallops or clicks. No pedal edema. Gastrointestinal system: Abdomen is nondistended, soft and nontender. No organomegaly or masses felt. Normal bowel sounds heard. Central nervous system: Alert and oriented. No focal neurological deficits. Extremities: Symmetric 5 x 5 power. Skin: No rashes, lesions or ulcers Psychiatry: Judgement and insight appear normal. Mood & affect appropriate.     Data Reviewed: I have personally reviewed following labs and imaging studies  CBC: Recent Labs  Lab 07/20/19 2144 07/22/19 1315 07/23/19 0551 07/24/19 0556  WBC 7.9 11.3* 6.3 7.5  NEUTROABS 6.3 10.0*  --   --   HGB 10.6* 11.2* 10.5* 11.4*  HCT 31.9* 33.8* 31.9* 34.3*  MCV 92.7 93.6 94.7 93.5  PLT 250 275 313 361   Basic Metabolic Panel: Recent Labs  Lab 07/20/19 2144 07/22/19 1315 07/23/19 0551 07/24/19 0556  NA 125* 127* 131* 132*  K 3.5 3.9 3.7 3.9  CL 91* 94* 99 98  CO2 25 26 24 26   GLUCOSE 119* 117* 91 117*  BUN 9 8 7* 10  CREATININE 0.41* 0.50* 0.55* 0.44*  CALCIUM 7.4* 7.5* 7.4* 7.8*   GFR: Estimated Creatinine Clearance: 72.1 mL/min (A) (by C-G formula based on SCr of 0.44 mg/dL (L)). Liver Function Tests: Recent Labs  Lab 07/20/19 2144 07/22/19 1315 07/23/19 0551  AST 33 22 17  ALT 61* 42 33  ALKPHOS 374* 459* 414*  BILITOT 0.8 0.6 0.5  PROT 5.9* 6.2* 5.5*  ALBUMIN 2.5* 2.6* 2.3*   No results for input(s): LIPASE, AMYLASE in the last 168 hours. No results for input(s): AMMONIA in the last 168 hours. Coagulation  Profile: No results for input(s): INR, PROTIME in the last 168 hours. Cardiac Enzymes: No results for input(s): CKTOTAL, CKMB, CKMBINDEX, TROPONINI in the last 168 hours. BNP (last 3 results) No results for input(s): PROBNP in the last 8760 hours. HbA1C: No results for input(s): HGBA1C in the last 72 hours. CBG: No results for input(s): GLUCAP in the last 168 hours. Lipid Profile: No results for input(s): CHOL, HDL, LDLCALC, TRIG, CHOLHDL, LDLDIRECT in the last 72 hours. Thyroid Function Tests: Recent Labs    07/23/19 0551  TSH 1.840   Anemia Panel: Recent Labs    07/23/19 0551  VITAMINB12 775   Sepsis Labs: No results for input(s): PROCALCITON, LATICACIDVEN in the last 168 hours.  Recent Results (from the past 240 hour(s))  Urine culture     Status: Abnormal (Preliminary result)   Collection Time: 07/22/19  5:00 PM   Specimen: Urine, Random  Result Value Ref Range Status   Specimen Description   Final    URINE,  RANDOM Performed at [facility], [address], [place], [place] [address]    Special Requests   Final    NONE Performed at [facility], [address], [place], [place] [address]    Culture >=100,000 COLONIES/mL STAPHYLOCOCCUS AUREUS (A)  Final   Report Status PENDING  Incomplete  SARS Coronavirus 2 by RT PCR (hospital order, performed in [facility] hospital lab) Nasopharyngeal Nasopharyngeal Swab     Status: None   Collection Time: 07/22/19  5:02 PM   Specimen: Nasopharyngeal Swab  Result Value Ref Range Status   SARS Coronavirus 2 NEGATIVE NEGATIVE Final    Comment: (NOTE) SARS-CoV-2 target nucleic acids are NOT DETECTED. The SARS-CoV-2 RNA is generally detectable in upper and lower respiratory specimens during the acute phase of infection. The lowest concentration of SARS-CoV-2 viral copies this assay can detect is 250 copies / mL. A negative result does not preclude SARS-CoV-2 infection and should not be used as the sole basis for treatment or  other patient management decisions.  A negative result may occur with improper specimen collection / handling, submission of specimen other than nasopharyngeal swab, presence of viral mutation(s) within the areas targeted by this assay, and inadequate number of viral copies (<250 copies / mL). A negative result must be combined with clinical observations, patient history, and epidemiological information. Fact Sheet for Patients:   StrictlyIdeas.no Fact Sheet for Healthcare Providers: BankingDealers.co.za This test is not yet approved or cleared  by the [place] FDA and has been authorized for detection and/or diagnosis of SARS-CoV-2 by FDA under an Emergency Use Authorization (EUA).  This EUA will remain in effect (meaning this test can be used) for the duration of the COVID-19 declaration under Section 564(b)(1) of the Act, 21 U.S.C. section 360bbb-3(b)(1), unless the authorization is terminated or revoked sooner. Performed at [facility], [address], [place], [place] [address]          Radiology Studies: No results found.      Scheduled Meds: . aspirin EC  81 mg Oral Daily  . bicalutamide  50 mg Oral Daily  . dexamethasone  4 mg Oral BID WC  . enoxaparin (LOVENOX) injection  40 mg Subcutaneous Q24H  . feeding supplement (ENSURE ENLIVE)  237 mL Oral TID BM  . folic acid  1 mg Oral Daily  . gabapentin  400 mg Oral TID  . pantoprazole  40 mg Oral Daily  . thiamine  100 mg Oral Daily   Continuous Infusions: . sodium chloride 75 mL/hr at 07/24/19 1458  . cefTRIAXone (ROCEPHIN)  IV 1 g (07/24/19 0000)     LOS: 2 days    Time spent: 30 minutes    Lindey Renzulli Darleen Crocker, DO [facility]  If 7PM-7AM, please contact night-coverage [email or web address] 07/24/2019, 4:41 PM

## 2019-07-24 NOTE — Plan of Care (Signed)
  Problem: Acute Rehab PT Goals(only PT should resolve) Goal: Patient Will Transfer Sit To/From Stand Outcome: Progressing Flowsheets (Taken 07/24/2019 1120) Patient will transfer sit to/from stand: with supervision Goal: Pt Will Transfer Bed To Chair/Chair To Bed Outcome: Progressing Flowsheets (Taken 07/24/2019 1120) Pt will Transfer Bed to Chair/Chair to Bed: with supervision Goal: Pt Will Ambulate Outcome: Progressing Flowsheets (Taken 07/24/2019 1120) Pt will Ambulate:  > 125 feet  with min guard assist  with rolling walker   Tori Lasonja Lakins PT, DPT 07/24/19, 11:21 AM [phone]

## 2019-07-24 NOTE — TOC Progression Note (Signed)
Transition of Care (TOC) - Progression Note    Patient Details  Name: Keith Olson. MRN: [id number] Date of Birth: 12/22/1940  Transition of Care [facility]) CM/SW Contact  Salome Arnt, [place] Phone Number: 07/24/2019, 9:15 AM  Clinical Narrative:   Pt accepts bed at [facility]. LCSW updated [employer]. Will fax PT notes to [facility] when completed.     Expected Discharge Plan: Skilled Nursing Facility Barriers to Discharge: Continued Medical Work up  Expected Discharge Plan and Services Expected Discharge Plan: [place] In-house Referral: Clinical Social Work   Post Acute Care Choice: [place] Living arrangements for the past 2 months: Single Family Home                                       Social Determinants of Health (SDOH) Interventions    Readmission Risk Interventions Readmission Risk Prevention Plan 07/23/2019  Transportation Screening Complete  PCP or Specialist Appt within 3-5 Days Not Complete  HRI or Home Care Consult Complete  Social Work Consult for [place] Planning/Counseling Complete  Palliative Care Screening Not Applicable  Medication Review Press photographer) Complete  Some recent data might be hidden

## 2019-07-24 NOTE — TOC Progression Note (Signed)
Transition of Care (TOC) - Progression Note   Patient Details  Name: Keith Olson. MRN: [id number] Date of Birth: 01/08/1941  Transition of Care [facility]) CM/SW Contact  Sherie Don, LCSW Phone Number: 07/24/2019, 4:16 PM  Clinical Narrative: CSW called [employer] to follow up with patient's insurance authorization. [employer] created insurance authorization for an incorrect facility in [place]. CSW provided [employer] with [facility]'s correct address and NPI. CSW received call back indicating the patient now has insurance authorization and the reference ID# is: [id number]. CSW called Irven Shelling with [facility] to see if patient can discharge today. Per Jackelyn Poling, the pharmacy they use is now closed, so the patient will not be able to discharge until tomorrow morning. Physician and RN updated. Avoidable days updated.  Expected Discharge Plan: Skilled Nursing Facility Barriers to Discharge: Continued Medical Work up  Expected Discharge Plan and Services Expected Discharge Plan: [place] In-house Referral: Clinical Social Work Post Acute Care Choice: [place] Living arrangements for the past 2 months: Single Family Home  Readmission Risk Interventions Readmission Risk Prevention Plan 07/23/2019  Transportation Screening Complete  PCP or Specialist Appt within 3-5 Days Not Complete  HRI or Home Care Consult Complete  Social Work Consult for [place] Planning/Counseling Complete  Palliative Care Screening Not Applicable  Medication Review Press photographer) Complete  Some recent data might be hidden

## 2019-07-25 ENCOUNTER — Emergency Department (HOSPITAL_COMMUNITY)
Admission: EM | Admit: 2019-07-25 | Discharge: 2019-07-25 | Disposition: A | Discharge status: Home / Self Care | Payer: Medicare Other | Attending: Emergency Medicine | Admitting: Emergency Medicine

## 2019-07-25 ENCOUNTER — Ambulatory Visit (HOSPITAL_COMMUNITY): Payer: Medicare Other | Admitting: Hematology

## 2019-07-25 ENCOUNTER — Other Ambulatory Visit: Payer: Self-pay

## 2019-07-25 ENCOUNTER — Encounter (HOSPITAL_COMMUNITY): Payer: Self-pay | Admitting: Emergency Medicine

## 2019-07-25 DIAGNOSIS — Z85830 Personal history of malignant neoplasm of bone: Secondary | ICD-10-CM | POA: Diagnosis not present

## 2019-07-25 DIAGNOSIS — Z87891 Personal history of nicotine dependence: Secondary | ICD-10-CM | POA: Insufficient documentation

## 2019-07-25 DIAGNOSIS — C61 Malignant neoplasm of prostate: Secondary | ICD-10-CM | POA: Diagnosis not present

## 2019-07-25 DIAGNOSIS — R319 Hematuria, unspecified: Secondary | ICD-10-CM | POA: Diagnosis not present

## 2019-07-25 DIAGNOSIS — E039 Hypothyroidism, unspecified: Secondary | ICD-10-CM | POA: Diagnosis not present

## 2019-07-25 DIAGNOSIS — R350 Frequency of micturition: Secondary | ICD-10-CM | POA: Diagnosis not present

## 2019-07-25 DIAGNOSIS — Z7982 Long term (current) use of aspirin: Secondary | ICD-10-CM | POA: Diagnosis not present

## 2019-07-25 DIAGNOSIS — Z79899 Other long term (current) drug therapy: Secondary | ICD-10-CM | POA: Insufficient documentation

## 2019-07-25 DIAGNOSIS — N390 Urinary tract infection, site not specified: Secondary | ICD-10-CM | POA: Diagnosis not present

## 2019-07-25 DIAGNOSIS — C412 Malignant neoplasm of vertebral column: Secondary | ICD-10-CM | POA: Diagnosis not present

## 2019-07-25 LAB — BASIC METABOLIC PANEL WITH GFR
Anion gap: 9 (ref 5–15)
BUN: 12 mg/dL (ref 8–23)
CO2: 26 mmol/L (ref 22–32)
Calcium: 8 mg/dL — ABNORMAL LOW (ref 8.9–10.3)
Chloride: 97 mmol/L — ABNORMAL LOW (ref 98–111)
Creatinine, Ser: 0.54 mg/dL — ABNORMAL LOW (ref 0.61–1.24)
GFR calc Af Amer: >60 mL/min
GFR calc non Af Amer: >60 mL/min
Glucose, Bld: 106 mg/dL — ABNORMAL HIGH (ref 70–99)
Potassium: 4.1 mmol/L (ref 3.5–5.1)
Sodium: 132 mmol/L — ABNORMAL LOW (ref 135–145)

## 2019-07-25 LAB — URINE CULTURE: Culture: >=100000 — AB

## 2019-07-25 LAB — CBC
HCT: 36 % — ABNORMAL LOW (ref 39.0–52.0)
Hemoglobin: 11.7 g/dL — ABNORMAL LOW (ref 13.0–17.0)
MCH: 31.4 pg (ref 26.0–34.0)
MCHC: 32.5 g/dL (ref 30.0–36.0)
MCV: 96.5 fL (ref 80.0–100.0)
Platelets: 456 10*3/uL — ABNORMAL HIGH (ref 150–400)
RBC: 3.73 MIL/uL — ABNORMAL LOW (ref 4.22–5.81)
RDW: 15.5 % (ref 11.5–15.5)
WBC: 7.2 10*3/uL (ref 4.0–10.5)
nRBC: 0 % (ref 0.0–0.2)

## 2019-07-25 LAB — SARS CORONAVIRUS 2 (TAT 6-24 HRS): SARS Coronavirus 2: NEGATIVE

## 2019-07-25 MED ORDER — SULFAMETHOXAZOLE-TRIMETHOPRIM 800-160 MG PO TABS: 1.0000 | ORAL_TABLET | Freq: Two times a day (BID) | ORAL | Status: AC

## 2019-07-25 MED ORDER — FOLIC ACID 1 MG PO TABS: 1.0000 mg | ORAL_TABLET | Freq: Every day | ORAL | Status: DC

## 2019-07-25 MED ORDER — ALPRAZOLAM 0.5 MG PO TABS: 0.5000 mg | ORAL_TABLET | Freq: Two times a day (BID) | ORAL | 0 refills | Status: AC | PRN

## 2019-07-25 MED ORDER — OXYCODONE-ACETAMINOPHEN 5-325 MG PO TABS: 1.0000 | ORAL_TABLET | Freq: Four times a day (QID) | ORAL | 0 refills | Status: DC | PRN

## 2019-07-25 MED ORDER — HYDROMORPHONE HCL 2 MG PO TABS
4.0000 mg | ORAL_TABLET | Freq: Once | ORAL | Status: AC
  Administered 2019-07-25: 4 mg via ORAL
  Filled 2019-07-25: qty 2

## 2019-07-25 MED ORDER — HYDROMORPHONE HCL 4 MG PO TABS: 4.0000 mg | ORAL_TABLET | Freq: Four times a day (QID) | ORAL | 0 refills | Status: DC | PRN

## 2019-07-25 MED ORDER — AMLODIPINE BESYLATE 5 MG PO TABS: 5.0000 mg | ORAL_TABLET | Freq: Every day | ORAL | 11 refills | Status: DC

## 2019-07-25 MED ORDER — HYDROMORPHONE HCL 2 MG/ML IJ SOLN: 2.0000 mg | Freq: Once | INTRAMUSCULAR | Status: DC

## 2019-07-25 MED ORDER — FENTANYL 25 MCG/HR TD PT72
1.0000 | MEDICATED_PATCH | TRANSDERMAL | Status: DC
  Administered 2019-07-25: 1 via TRANSDERMAL
  Filled 2019-07-25: qty 1

## 2019-07-25 MED ORDER — FENTANYL 25 MCG/HR TD PT72: 1.0000 | MEDICATED_PATCH | TRANSDERMAL | 0 refills | Status: DC

## 2019-07-25 MED ORDER — THIAMINE HCL 100 MG PO TABS: 100.0000 mg | ORAL_TABLET | Freq: Every day | ORAL | Status: DC

## 2019-07-25 NOTE — Discharge Instructions (Addendum)
Call your doctor tomorrow if you want arranged so you are taking care of someplace else.

## 2019-07-25 NOTE — ED Provider Notes (Signed)
[facility] EMERGENCY DEPARTMENT Provider Note   CSN: [id number] Arrival date & time: 07/25/19  1947     History Chief Complaint  Patient presents with  . Uncontrollable Pain    Keith Olson. is a 79 y.o. male.  Patient with prostate cancer.  He was at the nursing home and they were unable to get his medicine so he came here  No language interpreter was used.  Headache Pain location:  Generalized Quality:  Dull Radiates to:  Does not radiate Severity currently:  1/10 Onset quality:  Sudden Timing:  Constant Progression:  Partially resolved Chronicity:  New Similar to prior headaches: no   Context: activity   Relieved by:  Nothing Associated symptoms: no abdominal pain, no back pain, no congestion, no cough, no diarrhea, no fatigue, no seizures and no sinus pressure        Past Medical History:  Diagnosis Date  . Agent orange exposure   . Arthritis   . Coronary artery disease    BMS to mid LAD 2001, DES to proximal LAD 2017  . Enlarged prostate    XRT 2016  . Headache    Sinus headaches   . Heart abnormality   . History of depression   . Hyperlipidemia   . Hypothyroidism   . Prostate cancer ([place]) 07/2014   hormonal therapy, external beam radiation therapy  . PTSD (post-traumatic stress disorder)     Patient Active Problem List   Diagnosis Date Noted  . Weakness 07/22/2019  . Acute lower UTI 07/22/2019  . Protein calorie malnutrition ([place]) 07/22/2019  . Urinary retention 07/18/2019  . Hyponatremia 06/29/2019  . Acute bilateral low back pain with sciatica   . Multiple lung nodules on CT   . 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 Unstable angina ([place])   . History of depression 09/05/2017  . Hypothyroidism 09/05/2017  . Depression, major, single episode, moderate ([place]) 11/26/2016  . Prostatitis 11/26/2016  . Rectal bleeding 02/25/2016  . Pain management contract signed 01/27/2016  .  Constipation 04/22/2015  . Lower abdominal pain 04/22/2015  . Anxiety state 12/16/2014  . Prostate cancer metastatic to bone ([place]) 09/19/2014  . 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 Hyperlipidemia 05/22/2012  . BPH (benign prostatic hyperplasia) 05/22/2012  . Chest pain 02/24/2012  . CAD (coronary artery disease) 02/24/2012    Past Surgical History:  Procedure Laterality Date  . BRONCHIAL BRUSHINGS  07/02/2019   Procedure: BRONCHIAL BRUSHINGS;  Surgeon: Rigoberto Noel, MD;  Location: [facility];  Service: Cardiopulmonary;;  . BRONCHIAL NEEDLE ASPIRATION BIOPSY  07/02/2019   Procedure: BRONCHIAL NEEDLE ASPIRATION BIOPSIES;  Surgeon: Rigoberto Noel, MD;  Location: [facility];  Service: Cardiopulmonary;;  . BRONCHIAL WASHINGS  07/02/2019   Procedure: BRONCHIAL WASHINGS;  Surgeon: Rigoberto Noel, MD;  Location: [facility];  Service: Cardiopulmonary;;  . CARDIAC CATHETERIZATION N/A 12/12/2015   Procedure: Left Heart Cath and Coronary Angiography;  Surgeon: Peter M Martinique, MD;  Location: [place] CV LAB;  Service: Cardiovascular;  Laterality: N/A;  . CARDIAC CATHETERIZATION N/A 12/12/2015   Procedure: Coronary Stent Intervention;  Surgeon: Peter M Martinique, MD;  Location: [place] CV LAB;  Service: Cardiovascular;  Laterality: N/A;  . CORONARY STENT INTERVENTION N/A 09/06/2017   Procedure: CORONARY STENT INTERVENTION;  Surgeon: Burnell Blanks, MD;  Location: [place] CV LAB;  Service: Cardiovascular;  Laterality: N/A;  . ENDOBRONCHIAL ULTRASOUND N/A 07/02/2019   Procedure: ENDOBRONCHIAL ULTRASOUND;  Surgeon: Rigoberto Noel, MD;  Location: [facility];  Service: Cardiopulmonary;  Laterality: N/A;  . FLEXIBLE BRONCHOSCOPY  07/02/2019   Procedure: FLEXIBLE BRONCHOSCOPY;  Surgeon: Rigoberto Noel, MD;  [facility]: [facility];  Service: Cardiopulmonary;;  . HERNIA REPAIR Right   . LEFT HEART CATH AND CORONARY ANGIOGRAPHY N/A 09/02/2016   Procedure: Left Heart Cath and Coronary  Angiography;  Surgeon: Leonie Man, MD;  Location: [place] CV LAB;  Service: Cardiovascular;  Laterality: N/A;  . LEFT HEART CATH AND CORONARY ANGIOGRAPHY N/A 09/06/2017   Procedure: LEFT HEART CATH AND CORONARY ANGIOGRAPHY;  Surgeon: Burnell Blanks, MD;  Location: [place] CV LAB;  Service: Cardiovascular;  Laterality: N/A;  . LEFT HEART CATH AND CORONARY ANGIOGRAPHY N/A 05/04/2019   Procedure: LEFT HEART CATH AND CORONARY ANGIOGRAPHY;  Surgeon: Martinique, Peter M, MD;  Location: [place] CV LAB;  Service: Cardiovascular;  Laterality: N/A;  . PROSTATE BIOPSY N/A 08/28/2014   Procedure: BIOPSY TRANSRECTAL ULTRASONIC PROSTATE (TUBP);  Surgeon: Rana Snare, MD;  [facility];  Service: Urology;  Laterality: N/A;  . TRANSURETHRAL RESECTION OF PROSTATE N/A 08/28/2014   Procedure: TRANSURETHRAL RESECTION OF THE PROSTATE WITH GYRUS INSTRUMENTS;  Surgeon: Rana Snare, MD;  Location: [facility];  Service: Urology;  Laterality: N/A;       Family History  Problem Relation Age of Onset  . Arthritis Mother   . Heart attack Father     Social History   Tobacco Use  . Smoking status: Never Smoker  . Smokeless tobacco: Former Systems developer    Types: Chew  Substance Use Topics  . Alcohol use: No  . Drug use: No    Home Medications Prior to Admission medications   Medication Sig Start Date End Date Taking? Authorizing Provider  ALPRAZolam Duanne Moron) 0.5 MG tablet Take 1 tablet (0.5 mg total) by mouth 2 (two) times daily as needed for anxiety. 07/25/19   Johnson, Clanford L, MD  amLODipine (NORVASC) 5 MG tablet Take 1 tablet (5 mg total) by mouth daily. 07/25/19 07/24/20  Murlean Iba, MD  aspirin EC 81 MG tablet Take 1 tablet (81 mg total) by mouth daily. 12/09/15   Satira Sark, MD  bicalutamide (CASODEX) 50 MG tablet Take 1 tablet (50 mg total) by mouth daily. 07/05/19   Maryanna Shape, NP  dexamethasone (DECADRON) 4 MG tablet Take 1 tablet (4 mg total) by mouth 2 (two) times  daily with a meal. 07/06/19   Rai, Ripudeep K, MD  diclofenac sodium (VOLTAREN) 1 % GEL APPLY 2 GRAMS TOPICALLY 4 TIMES DAILY Patient taking differently: Apply 2 g topically 4 (four) times daily as needed.  12/21/18   Dettinger, Fransisca Kaufmann, MD  fentaNYL (DURAGESIC) 25 MCG/HR Place 1 patch onto the skin every 3 (three) days. 07/25/19   Milton Ferguson, MD  folic acid (FOLVITE) 1 MG tablet Take 1 tablet (1 mg total) by mouth daily. 07/26/19   Johnson, Clanford L, MD  gabapentin (NEURONTIN) 400 MG capsule Take 1 capsule (400 mg total) by mouth 3 (three) times daily. 07/06/19   Rai, Ripudeep K, MD  HYDROmorphone (DILAUDID) 4 MG tablet Take 1 tablet (4 mg total) by mouth every 6 (six) hours as needed for severe pain. 07/25/19   Milton Ferguson, MD  meclizine (ANTIVERT) 25 MG tablet Take 1 tablet (25 mg total) by mouth 2 (two) times daily as needed for dizziness. 07/06/19   Rai, Vernelle Emerald, MD  nitroGLYCERIN (NITROSTAT) 0.4 MG SL tablet Place 1  tablet (0.4 mg total) under the tongue every 5 (five) minutes as needed for chest pain. X 3 doses Patient taking differently: Place 0.4 mg under the tongue every 5 (five) minutes as needed for chest pain.  09/07/17   Bhagat, Crista Luria, PA  oxyCODONE-acetaminophen (PERCOCET) 5-325 MG tablet Take 1 tablet by mouth every 6 (six) hours as needed. 07/25/19   Milton Ferguson, MD  pantoprazole (PROTONIX) 40 MG tablet Take 1 tablet (40 mg total) by mouth daily. 07/06/19   Rai, Vernelle Emerald, MD  polyethylene glycol powder (GLYCOLAX/MIRALAX) 17 GM/SCOOP powder Take 17 g by mouth daily as needed for mild constipation or moderate constipation.    [provider]  sulfamethoxazole-trimethoprim (BACTRIM DS) 800-160 MG tablet Take 1 tablet by mouth 2 (two) times daily for 4 days. 07/25/19 07/29/19  Johnson, Clanford L, MD  thiamine 100 MG tablet Take 1 tablet (100 mg total) by mouth daily. 07/26/19   Murlean Iba, MD    Allergies    Lipitor [atorvastatin]  Review of Systems     Review of Systems  Constitutional: Negative for appetite change and fatigue.       General pain  HENT: Negative for congestion, ear discharge and sinus pressure.   Eyes: Negative for discharge.  Respiratory: Negative for cough.   Cardiovascular: Negative for chest pain.  Gastrointestinal: Negative for abdominal pain and diarrhea.  Genitourinary: Negative for frequency and hematuria.  Musculoskeletal: Negative for back pain.  Skin: Negative for rash.  Neurological: Negative for seizures.  Psychiatric/Behavioral: Negative for hallucinations.    Physical Exam Updated Vital Signs BP (!) 175/80 (BP Location: Left Arm)   Pulse 63   Temp 98.1 F (36.7 C) (Oral)   Resp 18   Ht _0  (1.702 m)   Wt 65.9 kg   SpO2 100%   BMI 22.75 kg/m   Physical Exam Vitals and nursing note reviewed.  Constitutional:      Appearance: He is well-developed.  HENT:     Head: Normocephalic.     Nose: Nose normal.  Eyes:     General: No scleral icterus.    Conjunctiva/sclera: Conjunctivae normal.  Neck:     Thyroid: No thyromegaly.  Cardiovascular:     Rate and Rhythm: Normal rate and regular rhythm.     Heart sounds: No murmur. No friction rub. No gallop.   Pulmonary:     Breath sounds: No stridor. No wheezing or rales.  Chest:     Chest wall: No tenderness.  Abdominal:     General: There is no distension.     Tenderness: There is no abdominal tenderness. There is no rebound.  Musculoskeletal:        General: Normal range of motion.     Cervical back: Neck supple.  Lymphadenopathy:     Cervical: No cervical adenopathy.  Skin:    Findings: No erythema or rash.  Neurological:     Mental Status: He is alert and oriented to person, place, and time.     Motor: No abnormal muscle tone.     Coordination: Coordination normal.  Psychiatric:        Behavior: Behavior normal.     ED Results / Procedures / Treatments   Labs (all labs ordered are listed, but only abnormal results are  displayed) Labs Reviewed - No data to display  EKG None  Radiology No results found.  Procedures Procedures (including critical care time)  Medications Ordered in ED Medications  fentaNYL (DURAGESIC) 25 MCG/HR 1  patch (has no administration in time range)  HYDROmorphone (DILAUDID) tablet 4 mg (has no administration in time range)    ED Course  I have reviewed the triage vital signs and the nursing notes.  Pertinent labs & imaging results that were available during my care of the patient were reviewed by me and considered in my medical decision making (see chart for details).    MDM Rules/Calculators/A&P                      Patient with prostate cancer and chronic pain.  He is given prescription for Dilaudid and Percocet prepack and fentanyl patches.  He will follow-up next week Final Clinical Impression(s) / ED Diagnoses Final diagnoses:  Prostate CA [facility])    Rx / DC Orders ED Discharge Orders         Ordered    HYDROmorphone (DILAUDID) 4 MG tablet  Every 6 hours PRN     07/25/19 2053    oxyCODONE-acetaminophen (PERCOCET) 5-325 MG tablet  Every 6 hours PRN     07/25/19 2053    fentaNYL (DURAGESIC) 25 MCG/HR  every 72 hours     07/25/19 2055           Milton Ferguson, MD 07/25/19 [phone]

## 2019-07-25 NOTE — TOC Transition Note (Signed)
Transition of Care [facility]) - CM/SW Discharge Note   Patient Details  Name: Keith Olson. MRN: [id number] Date of Birth: 1940-03-23  Transition [facility]) CM/SW Contact:  Shade Flood, LCSW Phone Number: 07/25/2019, 3:10 PM   Clinical Narrative:     Pt stable for dc to [facility] per MD. Damaris Schooner with pt and he remains in agreement with this plan. Updated pt's son, Will, by phone. DC clinical sent electronically. RN to call report. EMS arranged.  There are no other TOC needs for dc.  Final next level of care: Skilled Nursing Facility Barriers to Discharge: Barriers Resolved   Patient Goals and CMS Choice Patient states their goals for this hospitalization and ongoing recovery are:: Short-term SNF CMS Medicare.gov Compare Post Acute Care list provided to:: Patient Choice offered to / list presented to : Patient  Discharge Placement PASRR number recieved: 07/23/19            Patient chooses bed at: Other - please specify in the comment section below:([facility]) Patient to be transferred to facility by: EMS Name of family member notified: Will (son) Patient and family notified of of transfer: 07/25/19  Discharge Plan and Services In-house Referral: Clinical Social Work   Post Acute Care Choice: [place]                               Social Determinants of Health (SDOH) Interventions     Readmission Risk Interventions Readmission Risk Prevention Plan 07/23/2019  Transportation Screening Complete  PCP or Specialist Appt within 3-5 Days Not Complete  HRI or Home Care Consult Complete  Social Work Consult for [place] Planning/Counseling Complete  Palliative Care Screening Not Applicable  Medication Review [employer]) Complete  Some recent data might be hidden

## 2019-07-25 NOTE — Care Management Important Message (Signed)
Important Message  Patient Details  Name: Keith Olson. MRN: [id number] Date of Birth: 07/26/1940   Medicare Important Message Given:  Yes     Shade Flood, LCSW 07/25/2019, 3:21 PM

## 2019-07-25 NOTE — ED Notes (Signed)
Pt and his sister want to get him medicated and discharged. They do not want to return to [facility] because the meds he needs are not available there until tomorrow afternoon. Pt wants to be discharged home with his sister . Dr zammit aware. This RN spoke with Landry Mellow at [facility] to verify the fentanyl patch dosing, verified as fentanyl 60mcg per hour and reported to dr zammit

## 2019-07-25 NOTE — Progress Notes (Signed)
IV removed, 2x2 gauze and paper tape applied to site, patient tolerated well. Reviewed AVS and discharge information with Denton Ar, LPN at [facility] and all questions answered.  Patient transported to [facility] by [facility].

## 2019-07-25 NOTE — Discharge Instructions (Signed)
IMPORTANT INFORMATION: PAY CLOSE ATTENTION   PHYSICIAN DISCHARGE INSTRUCTIONS  Follow with Primary care provider  Dettinger, Fransisca Kaufmann, MD  and other consultants as instructed by your Hospitalist Physician  [place] IF SYMPTOMS COME BACK, WORSEN OR NEW PROBLEM DEVELOPS   Please note: You were cared for by a hospitalist during your hospital stay. Every effort will be made to forward records to your primary care provider.  You can request that your primary care provider send for your hospital records if they have not received them.  Once you are discharged, your primary care physician will handle any further medical issues. Please note that NO REFILLS for any discharge medications will be authorized once you are discharged, as it is imperative that you return to your primary care physician (or establish a relationship with a primary care physician if you do not have one) for your post hospital discharge needs so that they can reassess your need for medications and monitor your lab values.  Please get a complete blood count and chemistry panel checked by your Primary MD at your next visit, and again as instructed by your Primary MD.  Get Medicines reviewed and adjusted: Please take all your medications with you for your next visit with your Primary MD  Laboratory/radiological data: Please request your Primary MD to go over all hospital tests and procedure/radiological results at the follow up, please ask your primary care provider to get all Hospital records sent to his/her office.  In some cases, they will be blood work, cultures and biopsy results pending at the time of your discharge. Please request that your primary care provider follow up on these results.  If you are diabetic, please bring your blood sugar readings with you to your follow up appointment with primary care.    Please call and make your follow up appointments as soon as possible.    Also  Note the following: If you experience worsening of your admission symptoms, develop shortness of breath, life threatening emergency, suicidal or homicidal thoughts you must seek medical attention immediately by calling 911 or calling your MD immediately  if symptoms less severe.  You must read complete instructions/literature along with all the possible adverse reactions/side effects for all the Medicines you take and that have been prescribed to you. Take any new Medicines after you have completely understood and accpet all the possible adverse reactions/side effects.   Do not drive when taking Pain medications or sleeping medications (Benzodiazepines)  Do not take more than prescribed Pain, Sleep and Anxiety Medications. It is not advisable to combine anxiety,sleep and pain medications without talking with your primary care practitioner  Special Instructions: If you have smoked or chewed Tobacco  in the last 2 yrs please stop smoking, stop any regular Alcohol  and or any Recreational drug use.  Wear Seat belts while driving.  Do not drive if taking any narcotic, mind altering or controlled substances or recreational drugs or alcohol.

## 2019-07-25 NOTE — Discharge Summary (Signed)
Physician Discharge Summary  Keith Olson. YTK:[id number] DOB: 12/13/1940 DOA: 07/22/2019  PCP: Dettinger, Fransisca Kaufmann, MD Oncologist: Delton Coombes  Admit date: 07/22/2019 Discharge date: 07/25/2019  Disposition:  SNF  Recommendations for Outpatient Follow-up:  1. Follow up with PCP in 2 weeks 2. Follow up with oncology office as scheduled 3. Please consult dietitian to see patient at SNF  Discharge Condition: STABLE   CODE STATUS: DNR    Brief Hospitalization Summary: Please see all hospital notes, images, labs for full details of the hospitalization.  ADMISSION HPI: Keith Olson. is a 79 y.o. 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  Patient presents with a couple weeks of increasing weakness.  He has been having increasing difficulty performing his ADLs, in particular getting around and making himself food.  He has not eaten in approximately a week.  He does have family in the area, but they are not able to care for him.  No palliating or provoking factors.  He does have an indwelling catheter.  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 Patient presents with a couple weeks of increasing weakness.He has been having increasing difficulty performing his ADLs, in particular getting around and making himself food. He has not eaten in approximately a week. He does have family in the area, but they are not able to care for him. No palliating or provoking factors.  He does have an indwelling catheter.  -Patient has been admitted with worsening weakness and falls at home along with malnutrition and some hyponatremia. He is noted to have staph aureus UTI.  This is a catheter associated UTI from his indwelling catheter that he had at home.   His Foley has been removed on 5/24 and he has been voiding without any difficulty.  He continues to remain on Rocephin with improvement in symptoms.  Sensitivities are pending.  Plans are for SNF on discharge likely in a.m. once insurance authorization performed.  Assessment & Plan:   Principal Problem:   Acute lower UTI Active Problems:   BPH with urinary obstruction   Prostate cancer metastatic to bone    CAD (coronary artery disease)   Hypothyroidism   Hyponatremia   Weakness   Protein calorie malnutrition    Generalized weakness-multifactorial -Related to worsening nutritional status as well as UTI -PT evaluation with need for SNF noted -TSH and B12 within normal limits  Staph aureus UTI -Likely catheter associated from home -Urine sensitivities reviewed -Bactrim DS ordered   Hyponatremia -Likely related to poor oral intake, but improved with IV fluid  Hypothyroidism -TSH within normal limits -Does not currently appear to be on levothyroxine  Protein calorie malnutrition -supplements ordered by dietitian  Metastatic prostate cancer -Patient has completed prior treatment  BPH with chronic obstruction -Indwelling Foley catheter removed 5/24 with good urine output noted -No further need for catheter at this time  Hypertension  Amlodipine 5 mg daily   DVT prophylaxis:Lovenox Code Status:DNR Family Communication: Dr Manuella Ghazi discussed with sister at bedside 5/24 Disposition Plan:SNF   Discharge Diagnoses:  Principal Problem:   Acute lower UTI Active Problems:   BPH with urinary obstruction   Prostate cancer metastatic to bone [facility])   CAD (coronary artery disease)   Hypothyroidism   Hyponatremia   Weakness   Protein calorie malnutrition ([place])  Discharge Instructions:  Allergies as of 07/25/2019      Reactions   Lipitor [atorvastatin]    Muscles aches      Medication List    STOP taking these medications   fentaNYL 25  MCG/HR Commonly known as: DURAGESIC   lidocaine 5 % Commonly known as: LIDODERM   nystatin 100000 UNIT/ML suspension Commonly known as: MYCOSTATIN     TAKE these medications   ALPRAZolam 0.5 MG tablet Commonly known as: XANAX Take 1 tablet (0.5 mg total) by mouth 2 (two) times daily as needed for anxiety. What changed: reasons to take this   amLODipine 5 MG tablet Commonly known as: NORVASC Take 1 tablet (5 mg total) by mouth daily.   aspirin EC 81 MG tablet Take 1 tablet (81 mg total) by mouth daily.   bicalutamide 50 MG tablet Commonly known as: CASODEX Take 1 tablet (50 mg total) by mouth daily.   dexamethasone 4 MG tablet Commonly known as: DECADRON Take 1 tablet (4 mg total) by mouth 2 (two) times daily with a meal.   diclofenac sodium 1 % Gel Commonly known as: VOLTAREN APPLY 2 GRAMS TOPICALLY 4 TIMES DAILY What changed: See the new instructions.   folic acid 1 MG tablet Commonly known as: FOLVITE Take 1 tablet (1 mg total) by mouth daily. Start taking on: Jul 26, 2019   gabapentin 400 MG capsule Commonly known as: NEURONTIN Take 1 capsule (400 mg total) by mouth 3 (three) times daily.   HYDROmorphone 4 MG tablet Commonly known as: DILAUDID Take 1 tablet (4 mg total) by mouth every 6 (six) hours as needed for up to 4 days for severe pain. What changed:   when to take this  reasons to take this   meclizine 25 MG tablet Commonly known as: ANTIVERT Take 1 tablet (25 mg total) by mouth 2 (two) times daily as needed for dizziness.   nitroGLYCERIN 0.4 MG SL tablet Commonly known as: NITROSTAT Place 1 tablet (0.4 mg total) under the tongue every 5 (five) minutes as needed for chest pain. X 3 doses What changed: additional instructions   pantoprazole 40 MG tablet Commonly known as: PROTONIX Take 1 tablet (40 mg total) by mouth daily.   polyethylene glycol powder 17 GM/SCOOP powder Commonly known as: GLYCOLAX/MIRALAX Take 17 g by mouth daily as needed  for mild constipation or moderate constipation.   sulfamethoxazole-trimethoprim 800-160 MG tablet Commonly known as: BACTRIM DS Take 1 tablet by mouth 2 (two) times daily for 4 days.   thiamine 100 MG tablet Take 1 tablet (100 mg total) by mouth daily. Start taking on: Jul 26, 2019       Contact information for follow-up providers    Derek Jack, MD. Schedule an appointment as soon as possible for a visit in 2 week(s).   Specialty: Hematology Contact information: [address] [place] [address] [phone]            Contact information for after-discharge care    [facility] .   Service: Skilled Nursing Contact information: [place] [phone]                 Allergies  Allergen Reactions  . Lipitor [Atorvastatin]     Muscles aches   Allergies as of 07/25/2019      Reactions   Lipitor [atorvastatin]    Muscles aches      Medication List  STOP taking these medications   fentaNYL 25 MCG/HR Commonly known as: DURAGESIC   lidocaine 5 % Commonly known as: LIDODERM   nystatin 100000 UNIT/ML suspension Commonly known as: MYCOSTATIN     TAKE these medications   ALPRAZolam 0.5 MG tablet Commonly known as: XANAX Take 1 tablet (0.5 mg total) by mouth 2 (two) times daily as needed for anxiety. What changed: reasons to take this   amLODipine 5 MG tablet Commonly known as: NORVASC Take 1 tablet (5 mg total) by mouth daily.   aspirin EC 81 MG tablet Take 1 tablet (81 mg total) by mouth daily.   bicalutamide 50 MG tablet Commonly known as: CASODEX Take 1 tablet (50 mg total) by mouth daily.   dexamethasone 4 MG tablet Commonly known as: DECADRON Take 1 tablet (4 mg total) by mouth 2 (two) times daily with a meal.   diclofenac sodium 1 % Gel Commonly known as: VOLTAREN APPLY 2 GRAMS TOPICALLY 4 TIMES DAILY What changed: See the new instructions.    folic acid 1 MG tablet Commonly known as: FOLVITE Take 1 tablet (1 mg total) by mouth daily. Start taking on: Jul 26, 2019   gabapentin 400 MG capsule Commonly known as: NEURONTIN Take 1 capsule (400 mg total) by mouth 3 (three) times daily.   HYDROmorphone 4 MG tablet Commonly known as: DILAUDID Take 1 tablet (4 mg total) by mouth every 6 (six) hours as needed for up to 4 days for severe pain. What changed:   when to take this  reasons to take this   meclizine 25 MG tablet Commonly known as: ANTIVERT Take 1 tablet (25 mg total) by mouth 2 (two) times daily as needed for dizziness.   nitroGLYCERIN 0.4 MG SL tablet Commonly known as: NITROSTAT Place 1 tablet (0.4 mg total) under the tongue every 5 (five) minutes as needed for chest pain. X 3 doses What changed: additional instructions   pantoprazole 40 MG tablet Commonly known as: PROTONIX Take 1 tablet (40 mg total) by mouth daily.   polyethylene glycol powder 17 GM/SCOOP powder Commonly known as: GLYCOLAX/MIRALAX Take 17 g by mouth daily as needed for mild constipation or moderate constipation.   sulfamethoxazole-trimethoprim 800-160 MG tablet Commonly known as: BACTRIM DS Take 1 tablet by mouth 2 (two) times daily for 4 days.   thiamine 100 MG tablet Take 1 tablet (100 mg total) by mouth daily. Start taking on: Jul 26, 2019       Procedures/Studies: CT HEAD WO CONTRAST  Result Date: 07/06/2019 CLINICAL DATA:  Vertigo EXAM: CT HEAD WITHOUT CONTRAST TECHNIQUE: Contiguous axial images were obtained from the base of the skull through the vertex without intravenous contrast. COMPARISON:  None. FINDINGS: Brain: No evidence of acute infarction, hemorrhage, hydrocephalus, extra-axial collection or mass lesion/mass effect. Vascular: No hyperdense vessel or unexpected calcification. Skull: No osseous abnormality. Sinuses/Orbits: Visualized paranasal sinuses are clear. Visualized mastoid sinuses are clear. Visualized orbits  demonstrate no focal abnormality. Other: None IMPRESSION: No acute intracranial pathology. Electronically Signed   By: Kathreen Devoid   On: 07/06/2019 13:39   MR CERVICAL SPINE W WO CONTRAST  Result Date: 06/27/2019 CLINICAL DATA:  Back pain, history of prostate cancer, new suspected lung cancer EXAM: MRI CERVICAL, THORACIC AND LUMBAR SPINE WITHOUT AND WITH CONTRAST CONTRAST:  7 mL Gadavist TECHNIQUE: Multiplanar and multiecho pulse sequences of the cervical spine, to include the craniocervical junction and cervicothoracic junction, and thoracic and lumbar spine, were obtained without intravenous contrast. COMPARISON:  None.  FINDINGS: MRI CERVICAL SPINE Motion artifact is present Alignment: Anteroposterior alignment is maintained. Vertebrae: There is abnormal marrow signal at multiple levels with greatest involvement of C3 and C4 vertebral bodies. There no compression deformity. No significant epidural disease noted. Cord: No definite abnormal signal within the above limitation. Posterior Fossa, vertebral arteries, paraspinal tissues: Unremarkable. Disc levels: Multilevel disc bulges, endplate osteophytes, and facet and uncovertebral hypertrophy. There is no high-grade canal stenosis. Multilevel foraminal stenosis is present and poorly evaluated due to artifact. MRI THORACIC SPINE Motion artifact is present Alignment:  Anteroposterior alignment is maintained. Vertebrae: Multifocal abnormal marrow signal with greatest involvement of T2-T4 and T11 and T12. There is mild to moderate compression deformity of T3 with less than 50% loss of height. Probable mild ventral epidural extension at this level. There is some foraminal extension at T2-T3, T3-T4, and T4-T5 levels. Cord:  No abnormal signal within the above limitation. Paraspinal and other soft tissues: Intrathoracic findings are better evaluated on the prior chest CT. Disc levels: Mild degenerative changes are present without significant degenerative stenosis. MRI  LUMBAR SPINE Motion artifact is present. Segmentation: Standard. Alignment:  Mild degenerative listhesis. Vertebrae: There is multifocal abnormal marrow signal throughout the lumbosacral spine and imaged bony pelvis. There is mild compression deformity of L4 with less than 50% loss of height. Ventral epidural disease is present at this level with resulting severe canal stenosis and crowding of cauda equina. Conus medullaris and cauda equina: Conus extends to the L1 level. Paraspinal and other soft tissues: Unremarkable Disc levels: L1-L2:  Disc bulge.  No significant canal or foraminal stenosis. L2-L3: Disc bulge with endplate osteophytic ridging and facet arthropathy with ligamentum flavum infolding. Moderate canal stenosis. Mild foraminal stenosis. L3-L4: Disc bulge with endplate osteophytic ridging and marked facet arthropathy with ligamentum flavum infolding. Marked canal stenosis with effacement of the lateral recesses. Moderate foraminal stenosis. L4-L5: Disc bulge with endplate osteophytic ridging and moderate right and marked left facet arthropathy with ligamentum flavum infolding. Moderate to marked canal stenosis. Moderate to marked foraminal stenosis. L5-S1: Disc bulge with endplate osteophytic ridging and moderate facet arthropathy. No canal stenosis. Moderate to marked foraminal stenosis. IMPRESSION: Motion degraded study with suboptimal evaluation. Diffuse osseous metastatic disease. Mild to moderate compression deformity of T3 with mild ventral epidural extension. No cord compression. Mild compression deformity of L4 with ventral epidural extension resulting in severe canal stenosis and crowding of cauda equina. Multilevel degenerative changes, greatest at lumbar levels with significant canal stenosis at L3-L4 and L4-L5. These results were called by telephone at the time of interpretation on 06/27/2019 at 6:21 pm to provider Dr. Vanita Panda, who verbally acknowledged these results. Electronically Signed    By: Macy Mis M.D.   On: 06/27/2019 18:22   MR THORACIC SPINE W WO CONTRAST  Result Date: 06/27/2019 CLINICAL DATA:  Back pain, history of prostate cancer, new suspected lung cancer EXAM: MRI CERVICAL, THORACIC AND LUMBAR SPINE WITHOUT AND WITH CONTRAST CONTRAST:  7 mL Gadavist TECHNIQUE: Multiplanar and multiecho pulse sequences of the cervical spine, to include the craniocervical junction and cervicothoracic junction, and thoracic and lumbar spine, were obtained without intravenous contrast. COMPARISON:  None. FINDINGS: MRI CERVICAL SPINE Motion artifact is present Alignment: Anteroposterior alignment is maintained. Vertebrae: There is abnormal marrow signal at multiple levels with greatest involvement of C3 and C4 vertebral bodies. There no compression deformity. No significant epidural disease noted. Cord: No definite abnormal signal within the above limitation. Posterior Fossa, vertebral arteries, paraspinal tissues: Unremarkable. Disc levels: Multilevel  disc bulges, endplate osteophytes, and facet and uncovertebral hypertrophy. There is no high-grade canal stenosis. Multilevel foraminal stenosis is present and poorly evaluated due to artifact. MRI THORACIC SPINE Motion artifact is present Alignment:  Anteroposterior alignment is maintained. Vertebrae: Multifocal abnormal marrow signal with greatest involvement of T2-T4 and T11 and T12. There is mild to moderate compression deformity of T3 with less than 50% loss of height. Probable mild ventral epidural extension at this level. There is some foraminal extension at T2-T3, T3-T4, and T4-T5 levels. Cord:  No abnormal signal within the above limitation. Paraspinal and other soft tissues: Intrathoracic findings are better evaluated on the prior chest CT. Disc levels: Mild degenerative changes are present without significant degenerative stenosis. MRI LUMBAR SPINE Motion artifact is present. Segmentation: Standard. Alignment:  Mild degenerative listhesis.  Vertebrae: There is multifocal abnormal marrow signal throughout the lumbosacral spine and imaged bony pelvis. There is mild compression deformity of L4 with less than 50% loss of height. Ventral epidural disease is present at this level with resulting severe canal stenosis and crowding of cauda equina. Conus medullaris and cauda equina: Conus extends to the L1 level. Paraspinal and other soft tissues: Unremarkable Disc levels: L1-L2:  Disc bulge.  No significant canal or foraminal stenosis. L2-L3: Disc bulge with endplate osteophytic ridging and facet arthropathy with ligamentum flavum infolding. Moderate canal stenosis. Mild foraminal stenosis. L3-L4: Disc bulge with endplate osteophytic ridging and marked facet arthropathy with ligamentum flavum infolding. Marked canal stenosis with effacement of the lateral recesses. Moderate foraminal stenosis. L4-L5: Disc bulge with endplate osteophytic ridging and moderate right and marked left facet arthropathy with ligamentum flavum infolding. Moderate to marked canal stenosis. Moderate to marked foraminal stenosis. L5-S1: Disc bulge with endplate osteophytic ridging and moderate facet arthropathy. No canal stenosis. Moderate to marked foraminal stenosis. IMPRESSION: Motion degraded study with suboptimal evaluation. Diffuse osseous metastatic disease. Mild to moderate compression deformity of T3 with mild ventral epidural extension. No cord compression. Mild compression deformity of L4 with ventral epidural extension resulting in severe canal stenosis and crowding of cauda equina. Multilevel degenerative changes, greatest at lumbar levels with significant canal stenosis at L3-L4 and L4-L5. These results were called by telephone at the time of interpretation on 06/27/2019 at 6:21 pm to provider Dr. Vanita Panda, who verbally acknowledged these results. Electronically Signed   By: Macy Mis M.D.   On: 06/27/2019 18:22   MR Lumbar Spine W Wo Contrast  Result Date:  06/27/2019 CLINICAL DATA:  Back pain, history of prostate cancer, new suspected lung cancer EXAM: MRI CERVICAL, THORACIC AND LUMBAR SPINE WITHOUT AND WITH CONTRAST CONTRAST:  7 mL Gadavist TECHNIQUE: Multiplanar and multiecho pulse sequences of the cervical spine, to include the craniocervical junction and cervicothoracic junction, and thoracic and lumbar spine, were obtained without intravenous contrast. COMPARISON:  None. FINDINGS: MRI CERVICAL SPINE Motion artifact is present Alignment: Anteroposterior alignment is maintained. Vertebrae: There is abnormal marrow signal at multiple levels with greatest involvement of C3 and C4 vertebral bodies. There no compression deformity. No significant epidural disease noted. Cord: No definite abnormal signal within the above limitation. Posterior Fossa, vertebral arteries, paraspinal tissues: Unremarkable. Disc levels: Multilevel disc bulges, endplate osteophytes, and facet and uncovertebral hypertrophy. There is no high-grade canal stenosis. Multilevel foraminal stenosis is present and poorly evaluated due to artifact. MRI THORACIC SPINE Motion artifact is present Alignment:  Anteroposterior alignment is maintained. Vertebrae: Multifocal abnormal marrow signal with greatest involvement of T2-T4 and T11 and T12. There is mild to moderate compression  deformity of T3 with less than 50% loss of height. Probable mild ventral epidural extension at this level. There is some foraminal extension at T2-T3, T3-T4, and T4-T5 levels. Cord:  No abnormal signal within the above limitation. Paraspinal and other soft tissues: Intrathoracic findings are better evaluated on the prior chest CT. Disc levels: Mild degenerative changes are present without significant degenerative stenosis. MRI LUMBAR SPINE Motion artifact is present. Segmentation: Standard. Alignment:  Mild degenerative listhesis. Vertebrae: There is multifocal abnormal marrow signal throughout the lumbosacral spine and imaged  bony pelvis. There is mild compression deformity of L4 with less than 50% loss of height. Ventral epidural disease is present at this level with resulting severe canal stenosis and crowding of cauda equina. Conus medullaris and cauda equina: Conus extends to the L1 level. Paraspinal and other soft tissues: Unremarkable Disc levels: L1-L2:  Disc bulge.  No significant canal or foraminal stenosis. L2-L3: Disc bulge with endplate osteophytic ridging and facet arthropathy with ligamentum flavum infolding. Moderate canal stenosis. Mild foraminal stenosis. L3-L4: Disc bulge with endplate osteophytic ridging and marked facet arthropathy with ligamentum flavum infolding. Marked canal stenosis with effacement of the lateral recesses. Moderate foraminal stenosis. L4-L5: Disc bulge with endplate osteophytic ridging and moderate right and marked left facet arthropathy with ligamentum flavum infolding. Moderate to marked canal stenosis. Moderate to marked foraminal stenosis. L5-S1: Disc bulge with endplate osteophytic ridging and moderate facet arthropathy. No canal stenosis. Moderate to marked foraminal stenosis. IMPRESSION: Motion degraded study with suboptimal evaluation. Diffuse osseous metastatic disease. Mild to moderate compression deformity of T3 with mild ventral epidural extension. No cord compression. Mild compression deformity of L4 with ventral epidural extension resulting in severe canal stenosis and crowding of cauda equina. Multilevel degenerative changes, greatest at lumbar levels with significant canal stenosis at L3-L4 and L4-L5. These results were called by telephone at the time of interpretation on 06/27/2019 at 6:21 pm to provider Dr. Vanita Panda, who verbally acknowledged these results. Electronically Signed   By: Macy Mis M.D.   On: 06/27/2019 18:22   US Venous Img Lower Unilateral Right  Result Date: 07/12/2019 CLINICAL DATA:  Right lower extremity pain and edema EXAM: RIGHT LOWER EXTREMITY VENOUS  DUPLEX ULTRASOUND TECHNIQUE: Gray-scale sonography with graded compression, as well as color Doppler and duplex ultrasound were performed to evaluate the right lower extremity deep venous system from the level of the common femoral vein and including the common femoral, femoral, profunda femoral, popliteal and calf veins including the posterior tibial, peroneal and gastrocnemius veins when visible. The superficial great saphenous vein was also interrogated. Spectral Doppler was utilized to evaluate flow at rest and with distal augmentation maneuvers in the common femoral, femoral and popliteal veins. COMPARISON:  None. FINDINGS: Contralateral Common Femoral Vein: Respiratory phasicity is normal and symmetric with the symptomatic side. No evidence of thrombus. Normal compressibility. Common Femoral Vein: No evidence of thrombus. Normal compressibility, respiratory phasicity and response to augmentation. Saphenofemoral Junction: No evidence of thrombus. Normal compressibility and flow on color Doppler imaging. Profunda Femoral Vein: No evidence of thrombus. Normal compressibility and flow on color Doppler imaging. Femoral Vein: No evidence of thrombus. Normal compressibility, respiratory phasicity and response to augmentation. Popliteal Vein: No evidence of thrombus. Normal compressibility, respiratory phasicity and response to augmentation. Calf Veins: No evidence of thrombus. Normal compressibility and flow on color Doppler imaging. Superficial Great Saphenous Vein: No evidence of thrombus. Normal compressibility. Venous Reflux:  None. Other Findings:  None. IMPRESSION: No evidence of deep venous thrombosis in  the right lower extremity. Left common femoral vein also Electronically Signed   By: Lowella Grip III M.D.   On: 07/12/2019 16:17   DG Chest Portable 1 View  Result Date: 07/20/2019 CLINICAL DATA:  Short of breath since yesterday, weakness, history of prostate cancer EXAM: PORTABLE CHEST 1 VIEW  COMPARISON:  05/03/2019, 06/27/2019 FINDINGS: Single frontal view of the chest demonstrates an unremarkable cardiac silhouette. No acute airspace disease, effusion, or pneumothorax. No acute fractures. Please note that the pulmonary metastases and bony metastases seen on prior CT scan are not readily apparent on this portable chest x-ray. IMPRESSION: 1. No acute intrathoracic process. 2. The intrathoracic and bony metastases seen on prior chest CT are not apparent on this portable radiograph. Electronically Signed   By: Randa Ngo M.D.   On: 07/20/2019 21:00   CT Angio Chest/Abd/Pel for Dissection W and/or Wo Contrast  Result Date: 06/27/2019 CLINICAL DATA:  79 year old male with history of severe lower abdominal and back pain. EXAM: CT ANGIOGRAPHY CHEST, ABDOMEN AND PELVIS TECHNIQUE: Non-contrast CT of the chest was initially obtained. Multidetector CT imaging through the chest, abdomen and pelvis was performed using the standard protocol during bolus administration of intravenous contrast. Multiplanar reconstructed images and MIPs were obtained and reviewed to evaluate the vascular anatomy. CONTRAST:  130mL OMNIPAQUE IOHEXOL 350 MG/ML SOLN COMPARISON:  CT the abdomen and pelvis 04/20/2012. FINDINGS: CTA CHEST FINDINGS Cardiovascular: Precontrast images demonstrate no crescentic high attenuation associated with the wall of the thoracic aorta to suggest acute intramural hematoma. No aneurysm or dissection of the thoracic aorta. 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 Mid thoracic aortic arch measures 2.9 cm in diameter. Descending thoracic aorta measures 2.8 cm in diameter. Heart size is normal. There is no significant pericardial fluid, thickening or pericardial calcification. There is aortic atherosclerosis, as well as atherosclerosis of the great vessels of the mediastinum and the coronary arteries, including calcified atherosclerotic plaque in the left main, left anterior descending,  left circumflex and right coronary arteries. Mediastinum/Nodes: Enlarged AP window lymph node measuring 1.7 cm in short axis. Borderline enlarged left hilar lymph nodes measuring up to 1.3 cm in short axis. Small hiatal hernia. No axillary lymphadenopathy. Lungs/Pleura: Multiple pulmonary nodules are noted, largest of which is in the left upper lobe (axial image 63 of series 7 and sagittal image 141 of series 10) measuring 2.5 x 1.6 x 1.5 cm. This lesion has macrolobulated slightly ill-defined margins. Several other smaller pulmonary nodules are noted including a 7 x 4 mm left upper lobe nodule (axial image 65 of series 7), and a 1.0 x 0.5 cm nodule in the superior segment of the right lower lobe (axial image 44 of series 7). No acute consolidative airspace disease. No pleural effusions. Musculoskeletal: There are no aggressive appearing lytic or blastic lesions noted in the visualized portions of the skeleton. Review of the MIP images confirms the above findings. CTA ABDOMEN AND PELVIS FINDINGS VASCULAR Aorta: Normal caliber aorta without aneurysm, dissection, vasculitis or significant stenosis. Extensive atherosclerotic disease throughout the abdominal aorta. Celiac: Patent without evidence of dissection, vasculitis or significant stenosis. Mild ectasia of the proximal celiac axis which measures 8 mm in diameter. SMA: Patent without evidence of aneurysm, dissection, vasculitis or significant stenosis. Renals: Both renal arteries are patent without evidence of aneurysm, dissection, vasculitis, fibromuscular dysplasia or significant stenosis. IMA: Patent without evidence of aneurysm, dissection, vasculitis or significant stenosis. Inflow: Patent without evidence of aneurysm, dissection, vasculitis or significant stenosis. Veins: No  obvious venous abnormality within the limitations of this arterial phase study. Review of the MIP images confirms the above findings. NON-VASCULAR Hepatobiliary: 1.2 cm low-attenuation  lesion in segment 2 of the liver, compatible with a simple cyst. No other larger more suspicious appearing pulmonary nodules or masses are noted. No intra or extrahepatic biliary ductal dilatation. Gallbladder is normal in appearance. Pancreas: No pancreatic mass. No pancreatic ductal dilatation. No pancreatic or peripancreatic fluid collections or inflammatory changes. Spleen: Unremarkable. Adrenals/Urinary Tract: Subcentimeter low-attenuation lesion in the posterior aspect of the interpolar region of the left kidney, too small to characterize, but statistically likely to represent a tiny cyst. Right kidney and bilateral adrenal glands are normal in appearance. No hydroureteronephrosis. Urinary bladder is normal in appearance. Stomach/Bowel: Normal appearance of the stomach. No pathologic dilatation of small bowel or colon. Numerous colonic diverticulae are noted, without surrounding inflammatory changes to suggest an acute diverticulitis at this time. The appendix is not confidently identified and may be surgically absent. Regardless, there are no inflammatory changes noted adjacent to the cecum to suggest the presence of an acute appendicitis at this time. Lymphatic: No lymphadenopathy noted in the abdomen or pelvis. Reproductive: Fiducial markers in the prostate gland. Other: No significant volume of ascites.  No pneumoperitoneum. Musculoskeletal: There are no aggressive appearing lytic or blastic lesions noted in the visualized portions of the skeleton. Review of the MIP images confirms the above findings. IMPRESSION: 1. No acute findings are noted in the chest, abdomen or pelvis to account for the patient's history of abdominal pain. 2. Multiple pulmonary nodules in the lungs bilaterally, largest of which is a left upper lobe pulmonary nodule measuring 2.5 x 1.6 x 1.5 cm. This is associated with left hilar and AP window lymphadenopathy. Findings are highly concerning for primary bronchogenic carcinoma with  metastatic disease to the lymph nodes and lungs in the thorax. Further evaluation with PET-CT is recommended in the near future. No definite signs of metastatic disease in the abdomen or pelvis. 3. Colonic diverticulosis without evidence of acute diverticulitis at this time. 4. Aortic atherosclerosis, in addition to left main and 3 vessel coronary artery disease. Assessment for potential risk factor modification, dietary therapy or pharmacologic therapy may be warranted, if clinically indicated. 5. 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 Recommend annual imaging followup by CTA or MRA. This recommendation follows 2010 ACCF/AHA/AATS/ACR/ASA/SCA/SCAI/SIR/STS/SVM Guidelines for the Diagnosis and Management of Patients with Thoracic Aortic Disease. Circulation. 2010; 121: I627-O350. Aortic aneurysm NOS (ICD10-I71.9). 6. Additional incidental findings, as above. Electronically Signed   By: Vinnie Langton M.D.   On: 06/27/2019 12:26     Subjective: Pt reports no specific complaints, he is agreeable to go to SNF rehab.   Discharge Exam: Vitals:   07/24/19 2135 07/25/19 0444  BP: (!) 150/78 (!) 171/83  Pulse: 69 61  Resp: 20 18  Temp: 98.7 F (37.1 C) 98.3 F (36.8 C)  SpO2: 97% 98%   Vitals:   07/24/19 1852 07/24/19 2002 07/24/19 2135 07/25/19 0444  BP: (!) 146/72  (!) 150/78 (!) 171/83  Pulse: 68  69 61  Resp: 20  20 18   Temp: 97.7 F (36.5 C)  98.7 F (37.1 C) 98.3 F (36.8 C)  TempSrc: Oral  Oral Oral  SpO2: 100% 100% 97% 98%  Weight:      Height:       General: Pt is alert, awake, not in acute distress Cardiovascular: RRR, S1/S2 +, no rubs, no gallops Respiratory: CTA  bilaterally, no wheezing, no rhonchi Abdominal: Soft, NT, ND, bowel sounds + Extremities: no edema, no cyanosis   The results of significant diagnostics from this hospitalization (including imaging, microbiology, ancillary and laboratory) are listed below for reference.      Microbiology: Recent Results (from the past 240 hour(s))  Urine culture     Status: Abnormal   Collection Time: 07/22/19  5:00 PM   Specimen: Urine, Random  Result Value Ref Range Status   Specimen Description   Final    URINE, RANDOM Performed at [facility], [address], [place], [place] [address]    Special Requests   Final    NONE Performed at [facility], [address] [place], [place] [address]    Culture >=100,000 COLONIES/mL STAPHYLOCOCCUS AUREUS (A)  Final   Report Status 07/25/2019 FINAL  Final   Organism ID, Bacteria STAPHYLOCOCCUS AUREUS (A)  Final      Susceptibility   Staphylococcus aureus - MIC*    CIPROFLOXACIN >=8 RESISTANT Resistant     GENTAMICIN <=0.5 SENSITIVE Sensitive     NITROFURANTOIN <=16 SENSITIVE Sensitive     OXACILLIN <=0.25 SENSITIVE Sensitive     TETRACYCLINE <=1 SENSITIVE Sensitive     VANCOMYCIN <=0.5 SENSITIVE Sensitive     TRIMETH/SULFA <=10 SENSITIVE Sensitive     CLINDAMYCIN <=0.25 SENSITIVE Sensitive     RIFAMPIN <=0.5 SENSITIVE Sensitive     Inducible Clindamycin NEGATIVE Sensitive     * >=100,000 COLONIES/mL STAPHYLOCOCCUS AUREUS  SARS Coronavirus 2 by RT PCR (hospital order, performed in [place] hospital lab) Nasopharyngeal Nasopharyngeal Swab     Status: None   Collection Time: 07/22/19  5:02 PM   Specimen: Nasopharyngeal Swab  Result Value Ref Range Status   SARS Coronavirus 2 NEGATIVE NEGATIVE Final    Comment: (NOTE) SARS-CoV-2 target nucleic acids are NOT DETECTED. The SARS-CoV-2 RNA is generally detectable in upper and lower respiratory specimens during the acute phase of infection. The lowest concentration of SARS-CoV-2 viral copies this assay can detect is 250 copies / mL. A negative result does not preclude SARS-CoV-2 infection and should not be used as the sole basis for treatment or other patient management decisions.  A negative result may occur with improper specimen collection / handling, submission of  specimen other than nasopharyngeal swab, presence of viral mutation(s) within the areas targeted by this assay, and inadequate number of viral copies (<250 copies / mL). A negative result must be combined with clinical observations, patient history, and epidemiological information. Fact Sheet for Patients:   StrictlyIdeas.no Fact Sheet for Healthcare Providers: BankingDealers.co.za This test is not yet approved or cleared  by the [place] FDA and has been authorized for detection and/or diagnosis of SARS-CoV-2 by FDA under an Emergency Use Authorization (EUA).  This EUA will remain in effect (meaning this test can be used) for the duration of the COVID-19 declaration under Section 564(b)(1) of the Act, 21 U.S.C. section 360bbb-3(b)(1), unless the authorization is terminated or revoked sooner. Performed at [facility], [address], [place], [place] [address]      Labs: BNP (last 3 results) No results for input(s): BNP in the last 8760 hours. Basic Metabolic Panel: Recent Labs  Lab 07/20/19 2144 07/22/19 1315 07/23/19 0551 07/24/19 0556 07/25/19 0538  NA 125* 127* 131* 132* 132*  K 3.5 3.9 3.7 3.9 4.1  CL 91* 94* 99 98 97*  CO2 25 26 24 26 26   GLUCOSE 119* 117* 91 117* 106*  BUN 9 8  7* 10 12  CREATININE 0.41* 0.50* 0.55* 0.44* 0.54*  CALCIUM 7.4* 7.5* 7.4* 7.8* 8.0*   Liver Function Tests: Recent Labs  Lab 07/20/19 2144 07/22/19 1315 07/23/19 0551  AST 33 22 17  ALT 61* 42 33  ALKPHOS 374* 459* 414*  BILITOT 0.8 0.6 0.5  PROT 5.9* 6.2* 5.5*  ALBUMIN 2.5* 2.6* 2.3*   No results for input(s): LIPASE, AMYLASE in the last 168 hours. No results for input(s): AMMONIA in the last 168 hours. CBC: Recent Labs  Lab 07/20/19 2144 07/22/19 1315 07/23/19 0551 07/24/19 0556 07/25/19 0538  WBC 7.9 11.3* 6.3 7.5 7.2  NEUTROABS 6.3 10.0*  --   --   --   HGB 10.6* 11.2* 10.5* 11.4* 11.7*  HCT 31.9* 33.8* 31.9* 34.3*  36.0*  MCV 92.7 93.6 94.7 93.5 96.5  PLT 250 275 313 393 456*   Cardiac Enzymes: No results for input(s): CKTOTAL, CKMB, CKMBINDEX, TROPONINI in the last 168 hours. BNP: Invalid input(s): POCBNP CBG: No results for input(s): GLUCAP in the last 168 hours. D-Dimer No results for input(s): DDIMER in the last 72 hours. Hgb A1c No results for input(s): HGBA1C in the last 72 hours. Lipid Profile No results for input(s): CHOL, HDL, LDLCALC, TRIG, CHOLHDL, LDLDIRECT in the last 72 hours. Thyroid function studies Recent Labs    07/23/19 0551  TSH 1.840   Anemia work up Recent Labs    07/23/19 0551  VITAMINB12 775   Urinalysis    Component Value Date/Time   COLORURINE YELLOW 07/22/2019 1345   APPEARANCEUR HAZY (A) 07/22/2019 1345   APPEARANCEUR Clear 05/22/2018 1547   LABSPEC 1.002 (L) 07/22/2019 1345   PHURINE 9.0 (H) 07/22/2019 1345   GLUCOSEU NEGATIVE 07/22/2019 1345   HGBUR SMALL (A) 07/22/2019 1345   BILIRUBINUR NEGATIVE 07/22/2019 1345   BILIRUBINUR Negative 05/22/2018 1547   KETONESUR NEGATIVE 07/22/2019 1345   PROTEINUR NEGATIVE 07/22/2019 1345   UROBILINOGEN negative 03/07/2014 1059   NITRITE NEGATIVE 07/22/2019 1345   LEUKOCYTESUR MODERATE (A) 07/22/2019 1345   Sepsis Labs Invalid input(s): PROCALCITONIN,  WBC,  LACTICIDVEN Microbiology Recent Results (from the past 240 hour(s))  Urine culture     Status: Abnormal   Collection Time: 07/22/19  5:00 PM   Specimen: Urine, Random  Result Value Ref Range Status   Specimen Description   Final    URINE, RANDOM Performed at [facility], [address], [place], [place] [address]    Special Requests   Final    NONE Performed at [facility], [address] [place], [place] [address]    Culture >=100,000 COLONIES/mL STAPHYLOCOCCUS AUREUS (A)  Final   Report Status 07/25/2019 FINAL  Final   Organism ID, Bacteria STAPHYLOCOCCUS AUREUS (A)  Final      Susceptibility   Staphylococcus aureus - MIC*    CIPROFLOXACIN  >=8 RESISTANT Resistant     GENTAMICIN <=0.5 SENSITIVE Sensitive     NITROFURANTOIN <=16 SENSITIVE Sensitive     OXACILLIN <=0.25 SENSITIVE Sensitive     TETRACYCLINE <=1 SENSITIVE Sensitive     VANCOMYCIN <=0.5 SENSITIVE Sensitive     TRIMETH/SULFA <=10 SENSITIVE Sensitive     CLINDAMYCIN <=0.25 SENSITIVE Sensitive     RIFAMPIN <=0.5 SENSITIVE Sensitive     Inducible Clindamycin NEGATIVE Sensitive     * >=100,000 COLONIES/mL STAPHYLOCOCCUS AUREUS  SARS Coronavirus 2 by RT PCR (hospital order, performed in [place] hospital lab) Nasopharyngeal Nasopharyngeal Swab     Status: None   Collection Time: 07/22/19  5:02 PM   Specimen: Nasopharyngeal Swab  Result Value Ref Range Status   SARS Coronavirus 2 NEGATIVE NEGATIVE Final    Comment: (NOTE) SARS-CoV-2 target nucleic acids are NOT DETECTED. The SARS-CoV-2 RNA is generally detectable in upper and lower respiratory specimens during the acute phase of infection. The lowest concentration of SARS-CoV-2 viral copies this assay can detect is 250 copies / mL. A negative result does not preclude SARS-CoV-2 infection and should not be used as the sole basis for treatment or other patient management decisions.  A negative result may occur with improper specimen collection / handling, submission of specimen other than nasopharyngeal swab, presence of viral mutation(s) within the areas targeted by this assay, and inadequate number of viral copies (<250 copies / mL). A negative result must be combined with clinical observations, patient history, and epidemiological information. Fact Sheet for Patients:   StrictlyIdeas.no Fact Sheet for Healthcare Providers: BankingDealers.co.za This test is not yet approved or cleared  by the [place] FDA and has been authorized for detection and/or diagnosis of SARS-CoV-2 by FDA under an Emergency Use Authorization (EUA).  This EUA will remain in effect  (meaning this test can be used) for the duration of the COVID-19 declaration under Section 564(b)(1) of the Act, 21 U.S.C. section 360bbb-3(b)(1), unless the authorization is terminated or revoked sooner. Performed at [facility], [address], [place], [place] [address]    Time coordinating discharge: 33 minutes   SIGNED:  Irwin Brakeman, MD  [facility] 07/25/2019, 12:35 PM How to contact the [facility] Attending or Consulting provider [place] or covering provider during after hours [place], for this patient?  1. Check the care team in [facility] and look for a) attending/consulting [facility] provider listed and b) the [facility] team listed 2. Log into [email or web address] and use [place]'s universal password to access. If you do not have the password, please contact the hospital operator. 3. Locate the [facility] provider you are looking for under [facility] and page to a number that you can be directly reached. 4. If you still have difficulty reaching the provider, please page the [facility] (Director on Call) for the Hospitalists listed on amion for assistance.

## 2019-07-25 NOTE — ED Triage Notes (Addendum)
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 [facility] RN states that their pharmacy is in [place] and are unable to get his pain medications. Per SNF RN, pain meds are fentanyl patch, morphine, and dilaudid. Per EMS, SNF states that he is to not come back to [place]. Last pain medication received at 1430.

## 2019-07-26 DIAGNOSIS — B370 Candidal stomatitis: Secondary | ICD-10-CM | POA: Diagnosis not present

## 2019-07-26 DIAGNOSIS — B351 Tinea unguium: Secondary | ICD-10-CM | POA: Diagnosis not present

## 2019-07-26 MED FILL — Oxycodone w/ Acetaminophen Tab 5-325 MG: ORAL | Qty: 6 | Status: AC

## 2019-08-01 ENCOUNTER — Inpatient Hospital Stay (HOSPITAL_COMMUNITY): Payer: Medicare Other | Attending: Hematology | Admitting: Nurse Practitioner

## 2019-08-01 ENCOUNTER — Other Ambulatory Visit: Payer: Self-pay

## 2019-08-01 ENCOUNTER — Other Ambulatory Visit (HOSPITAL_COMMUNITY): Payer: Self-pay | Admitting: Nurse Practitioner

## 2019-08-01 ENCOUNTER — Ambulatory Visit (INDEPENDENT_AMBULATORY_CARE_PROVIDER_SITE_OTHER): Payer: Medicare Other

## 2019-08-01 ENCOUNTER — Telehealth: Payer: Self-pay | Admitting: Radiation Oncology

## 2019-08-01 VITALS — BP 122/68 | HR 87 | Temp 96.8°F | Resp 18 | Wt 147.0 lb

## 2019-08-01 DIAGNOSIS — C7951 Secondary malignant neoplasm of bone: Secondary | ICD-10-CM | POA: Insufficient documentation

## 2019-08-01 DIAGNOSIS — I255 Ischemic cardiomyopathy: Secondary | ICD-10-CM

## 2019-08-01 DIAGNOSIS — C3491 Malignant neoplasm of unspecified part of right bronchus or lung: Secondary | ICD-10-CM

## 2019-08-01 DIAGNOSIS — M47816 Spondylosis without myelopathy or radiculopathy, lumbar region: Secondary | ICD-10-CM

## 2019-08-01 DIAGNOSIS — M8458XD Pathological fracture in neoplastic disease, other specified site, subsequent encounter for fracture with routine healing: Secondary | ICD-10-CM | POA: Diagnosis not present

## 2019-08-01 DIAGNOSIS — I700 Atherosclerosis of aorta: Secondary | ICD-10-CM

## 2019-08-01 DIAGNOSIS — C61 Malignant neoplasm of prostate: Secondary | ICD-10-CM | POA: Diagnosis not present

## 2019-08-01 DIAGNOSIS — K5730 Diverticulosis of large intestine without perforation or abscess without bleeding: Secondary | ICD-10-CM

## 2019-08-01 DIAGNOSIS — M5127 Other intervertebral disc displacement, lumbosacral region: Secondary | ICD-10-CM

## 2019-08-01 DIAGNOSIS — M2578 Osteophyte, vertebrae: Secondary | ICD-10-CM

## 2019-08-01 DIAGNOSIS — F411 Generalized anxiety disorder: Secondary | ICD-10-CM

## 2019-08-01 DIAGNOSIS — M47817 Spondylosis without myelopathy or radiculopathy, lumbosacral region: Secondary | ICD-10-CM

## 2019-08-01 DIAGNOSIS — F321 Major depressive disorder, single episode, moderate: Secondary | ICD-10-CM

## 2019-08-01 DIAGNOSIS — M438X4 Other specified deforming dorsopathies, thoracic region: Secondary | ICD-10-CM

## 2019-08-01 DIAGNOSIS — M5021 Other cervical disc displacement,  high cervical region: Secondary | ICD-10-CM

## 2019-08-01 DIAGNOSIS — M48061 Spinal stenosis, lumbar region without neurogenic claudication: Secondary | ICD-10-CM

## 2019-08-01 DIAGNOSIS — I77810 Thoracic aortic ectasia: Secondary | ICD-10-CM

## 2019-08-01 DIAGNOSIS — R338 Other retention of urine: Secondary | ICD-10-CM | POA: Insufficient documentation

## 2019-08-01 DIAGNOSIS — Z951 Presence of aortocoronary bypass graft: Secondary | ICD-10-CM

## 2019-08-01 DIAGNOSIS — F4310 Post-traumatic stress disorder, unspecified: Secondary | ICD-10-CM

## 2019-08-01 DIAGNOSIS — Z7982 Long term (current) use of aspirin: Secondary | ICD-10-CM

## 2019-08-01 DIAGNOSIS — M47814 Spondylosis without myelopathy or radiculopathy, thoracic region: Secondary | ICD-10-CM

## 2019-08-01 DIAGNOSIS — N138 Other obstructive and reflux uropathy: Secondary | ICD-10-CM

## 2019-08-01 DIAGNOSIS — M5126 Other intervertebral disc displacement, lumbar region: Secondary | ICD-10-CM

## 2019-08-01 DIAGNOSIS — L89311 Pressure ulcer of right buttock, stage 1: Secondary | ICD-10-CM

## 2019-08-01 DIAGNOSIS — E871 Hypo-osmolality and hyponatremia: Secondary | ICD-10-CM

## 2019-08-01 DIAGNOSIS — L89321 Pressure ulcer of left buttock, stage 1: Secondary | ICD-10-CM

## 2019-08-01 DIAGNOSIS — K219 Gastro-esophageal reflux disease without esophagitis: Secondary | ICD-10-CM

## 2019-08-01 DIAGNOSIS — M4802 Spinal stenosis, cervical region: Secondary | ICD-10-CM

## 2019-08-01 DIAGNOSIS — E039 Hypothyroidism, unspecified: Secondary | ICD-10-CM | POA: Diagnosis not present

## 2019-08-01 DIAGNOSIS — Z79891 Long term (current) use of opiate analgesic: Secondary | ICD-10-CM

## 2019-08-01 DIAGNOSIS — M5440 Lumbago with sciatica, unspecified side: Secondary | ICD-10-CM

## 2019-08-01 DIAGNOSIS — I25110 Atherosclerotic heart disease of native coronary artery with unstable angina pectoris: Secondary | ICD-10-CM

## 2019-08-01 DIAGNOSIS — N401 Enlarged prostate with lower urinary tract symptoms: Secondary | ICD-10-CM

## 2019-08-01 DIAGNOSIS — Z79899 Other long term (current) drug therapy: Secondary | ICD-10-CM

## 2019-08-01 DIAGNOSIS — Z9181 History of falling: Secondary | ICD-10-CM

## 2019-08-01 DIAGNOSIS — M438X6 Other specified deforming dorsopathies, lumbar region: Secondary | ICD-10-CM

## 2019-08-01 DIAGNOSIS — M4807 Spinal stenosis, lumbosacral region: Secondary | ICD-10-CM

## 2019-08-01 DIAGNOSIS — E785 Hyperlipidemia, unspecified: Secondary | ICD-10-CM

## 2019-08-01 MED ORDER — HYDROMORPHONE HCL 4 MG PO TABS: 4.0000 mg | ORAL_TABLET | Freq: Four times a day (QID) | ORAL | 0 refills | Status: DC | PRN

## 2019-08-01 NOTE — Assessment & Plan Note (Addendum)
1.  Prostate cancer: -History of prostate cancer diagnosed 08/28/2014, Gleason 4+4 = 8, status post radiation therapy and seed implants. -Lower back pain for the last several months, worse in the last 3 weeks leading to hospitalization. -PSA on admission on 06/19/2019 was 239, testosterone 210.  Alkaline phos elevated at 306. -MRI of the cervical, thoracic and lumbar spine on 06/27/2019 showed diffuse bone metastatic disease.  Mild to moderate compression deformity of T3 with mild ventral epidural extension.  No cord compression.  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 -CT angio of the chest, abdomen and pelvis on 06/27/2019 showed multiple lung nodules bilaterally, largest in the left upper lobe measuring 2.5 x 1.6 x 1.5 cm with associated left hilar and AP window lymphadenopathy. -Navigational bronchoscopy and biopsy on 07/02/2019 with left upper lobe brushing and washing showing atypical cells.  10 L FNA also showing atypical cells. -Denies any weight loss or GI symptoms. -He smoked a pack per day for 4 to 5 years and quit 60 years ago.  He is a [place] [employer] veteran and had agent orange exposure. -We will give him Lupron 45 mg intramuscularly as soon as we can get insurance authorization. -As the findings are concerning for primary bronchogenic carcinoma, we will obtain a PET scan and he will follow-up with Dr. Delton Coombes afterwards.  2.  Lower back pain: -Reports lower back pain radiating down the posterior right thigh. -MRI showed mild compression deformity of the L4 with ventral epidural extension resulting in severe canal stenosis and crowding of the cauda equina. -10 treatments of radiation therapy completed on 07/11/2019.  Did not notice any improvement in the pain yet. -He is taking Dilaudid 4 mg every 4 hours even during the night.  He was told to take stool softeners with these. -He was just released  from the hospital and was given 25 mg fentanyl patches to use as breakthrough pain.  3.  Urinary retention: -He has an indwelling Foley catheter.  4.  Right leg swelling: -He has distal swelling of the right ankle for the last 1 to 2 days.  Denies any pain.  No pleuritic chest pain. -Doppler done on 07/12/2019 which showed no DVT.

## 2019-08-01 NOTE — Progress Notes (Signed)
[place], [place] [address]   CLINIC:  Medical Oncology/Hematology  PCP:  Dettinger, Fransisca Kaufmann, MD [place] [address] [phone]   REASON FOR VISIT: Follow-up for prostate cancer   CURRENT THERAPY: Lupron injections   INTERVAL HISTORY:  [name] 79 y.o. male returns for routine follow-up for prostate cancer.  Patient reports he was recently released from the hospital.  He was unable to go to his PET scan.  We will reschedule this.  He denies any new pains. Denies any nausea, vomiting, or diarrhea. Denies any new pains. Had not noticed any recent bleeding such as epistaxis, hematuria or hematochezia. Denies recent chest pain on exertion, shortness of breath on minimal exertion, pre-syncopal episodes, or palpitations. Denies any numbness or tingling in hands or feet. Denies any recent fevers, infections, or recent hospitalizations. Patient reports appetite at 50% and energy level at 0%.  He has maintained his weight at this time.    REVIEW OF SYSTEMS:  Review of Systems  Constitutional: Positive for fatigue.  Cardiovascular: Positive for leg swelling.  Neurological: Positive for dizziness.  All other systems reviewed and are negative.    PAST MEDICAL/SURGICAL HISTORY:  Past Medical History:  Diagnosis Date  . Agent orange exposure   . Arthritis   . Coronary artery disease    BMS to mid LAD 2001, DES to proximal LAD 2017  . Enlarged prostate    XRT 2016  . Headache    Sinus headaches   . Heart abnormality   . History of depression   . Hyperlipidemia   . Hypothyroidism   . Prostate cancer ([place]) 07/2014   hormonal therapy, external beam radiation therapy  . PTSD (post-traumatic stress disorder)    Past Surgical History:  Procedure Laterality Date  . BRONCHIAL BRUSHINGS  07/02/2019   Procedure: BRONCHIAL BRUSHINGS;  Surgeon: Rigoberto Noel, MD;  Location: [facility];  Service: Cardiopulmonary;;  . BRONCHIAL  NEEDLE ASPIRATION BIOPSY  07/02/2019   Procedure: BRONCHIAL NEEDLE ASPIRATION BIOPSIES;  Surgeon: Rigoberto Noel, MD;  Location: [facility];  Service: Cardiopulmonary;;  . BRONCHIAL WASHINGS  07/02/2019   Procedure: BRONCHIAL WASHINGS;  Surgeon: Rigoberto Noel, MD;  Location: [facility];  Service: Cardiopulmonary;;  . CARDIAC CATHETERIZATION N/A 12/12/2015   Procedure: Left Heart Cath and Coronary Angiography;  Surgeon: Peter M Martinique, MD;  Location: [facility];  Service: Cardiovascular;  Laterality: N/A;  . CARDIAC CATHETERIZATION N/A 12/12/2015   Procedure: Coronary Stent Intervention;  Surgeon: Peter M Martinique, MD;  Location: [facility];  Service: Cardiovascular;  Laterality: N/A;  . CORONARY STENT INTERVENTION N/A 09/06/2017   Procedure: CORONARY STENT INTERVENTION;  Surgeon: Burnell Blanks, MD;  Location: [facility];  Service: Cardiovascular;  Laterality: N/A;  . ENDOBRONCHIAL ULTRASOUND N/A 07/02/2019   Procedure: ENDOBRONCHIAL ULTRASOUND;  Surgeon: Rigoberto Noel, MD;  [facility];  Service: Cardiopulmonary;  Laterality: N/A;  . FLEXIBLE BRONCHOSCOPY  07/02/2019   Procedure: FLEXIBLE BRONCHOSCOPY;  Surgeon: Rigoberto Noel, MD;  [facility];  Service: Cardiopulmonary;;  . HERNIA REPAIR Right   . LEFT HEART CATH AND CORONARY ANGIOGRAPHY N/A 09/02/2016   Procedure: Left Heart Cath and Coronary Angiography;  Surgeon: Leonie Man, MD;  Location: [facility];  Service: Cardiovascular;  Laterality: N/A;  . LEFT HEART CATH AND CORONARY ANGIOGRAPHY N/A 09/06/2017   Procedure: LEFT HEART CATH AND CORONARY ANGIOGRAPHY;  Surgeon: Lauree Chandler  D, MD;  Location: [facility];  Service: Cardiovascular;  Laterality: N/A;  . LEFT HEART CATH AND CORONARY ANGIOGRAPHY N/A 05/04/2019   Procedure: LEFT HEART CATH AND CORONARY ANGIOGRAPHY;  Surgeon: Martinique, Peter M, MD;  Location: [facility];  Service: Cardiovascular;  Laterality: N/A;    . PROSTATE BIOPSY N/A 08/28/2014   Procedure: BIOPSY TRANSRECTAL ULTRASONIC PROSTATE (TUBP);  Surgeon: Rana Snare, MD;  [facility];  Service: Urology;  Laterality: N/A;  . TRANSURETHRAL RESECTION OF PROSTATE N/A 08/28/2014   Procedure: TRANSURETHRAL RESECTION OF THE PROSTATE WITH GYRUS INSTRUMENTS;  Surgeon: Rana Snare, MD;  [facility]: [facility];  Service: Urology;  Laterality: N/A;     SOCIAL HISTORY:  Social History   Socioeconomic History  . Marital status: Married    Spouse name: Not on file  . Number of children: 5  . Years of education: Not on file  . Highest education level: Not on file  Occupational History  . Occupation: retired  Tobacco Use  . Smoking status: Never Smoker  . Smokeless tobacco: Former Systems developer    Types: Chew  Substance and Sexual Activity  . Alcohol use: No  . Drug use: No  . Sexual activity: Yes  Other Topics Concern  . Not on file  Social History Narrative   Married   5 children   Social Determinants of Health   Financial Resource Strain: Low Risk   . Difficulty of Paying Living Expenses: Not hard at all  Food Insecurity: No Food Insecurity  . Worried About Charity fundraiser in the Last Year: Never true  . Ran Out of Food in the Last Year: Never true  Transportation Needs: No Transportation Needs  . Lack of Transportation (Medical): No  . Lack of Transportation (Non-Medical): No  Physical Activity: Inactive  . Days of Exercise per Week: 0 days  . Minutes of Exercise per Session: 0 min  Stress: No Stress Concern Present  . Feeling of Stress : Not at all  Social Connections: Somewhat Isolated  . Frequency of Communication with Friends and Family: More than three times a week  . Frequency of Social Gatherings with Friends and Family: More than three times a week  . Attends Religious Services: Never  . Active Member of Clubs or Organizations: No  . Attends Archivist Meetings: Never  . Marital Status: Married  Arboriculturist Violence: Not At Risk  . Fear of Current or Ex-Partner: No  . Emotionally Abused: No  . Physically Abused: No  . Sexually Abused: No    FAMILY HISTORY:  Family History  Problem Relation Age of Onset  . Arthritis Mother   . Heart attack Father     CURRENT MEDICATIONS:  Outpatient Encounter Medications as of 08/01/2019  Medication Sig Note  . amLODipine (NORVASC) 5 MG tablet Take 1 tablet (5 mg total) by mouth daily.   Marland Kitchen aspirin EC 81 MG tablet Take 1 tablet (81 mg total) by mouth daily.   . bicalutamide (CASODEX) 50 MG tablet Take 1 tablet (50 mg total) by mouth daily.   Marland Kitchen dexamethasone (DECADRON) 4 MG tablet Take 1 tablet (4 mg total) by mouth 2 (two) times daily with a meal.   . fentaNYL (DURAGESIC) 25 MCG/HR Place 1 patch onto the skin every 3 (three) days.   . folic acid (FOLVITE) 1 MG tablet Take 1 tablet (1 mg total) by mouth daily.   Marland Kitchen gabapentin (NEURONTIN) 400 MG capsule Take 1 capsule (  400 mg total) by mouth 3 (three) times daily.   . meclizine (ANTIVERT) 25 MG tablet Take 1 tablet (25 mg total) by mouth 2 (two) times daily as needed for dizziness.   . pantoprazole (PROTONIX) 40 MG tablet Take 1 tablet (40 mg total) by mouth daily.   Marland Kitchen thiamine 100 MG tablet Take 1 tablet (100 mg total) by mouth daily.   Marland Kitchen ALPRAZolam (XANAX) 0.5 MG tablet Take 1 tablet (0.5 mg total) by mouth 2 (two) times daily as needed for anxiety. (Patient not taking: Reported on 08/01/2019)   . diclofenac sodium (VOLTAREN) 1 % GEL APPLY 2 GRAMS TOPICALLY 4 TIMES DAILY (Patient not taking: No sig reported)   . HYDROmorphone (DILAUDID) 4 MG tablet Take 1 tablet (4 mg total) by mouth every 6 (six) hours as needed for severe pain. (Patient not taking: Reported on 08/01/2019)   . nitroGLYCERIN (NITROSTAT) 0.4 MG SL tablet Place 1 tablet (0.4 mg total) under the tongue every 5 (five) minutes as needed for chest pain. X 3 doses (Patient not taking: Reported on 08/01/2019) 06/27/2019: On hand if needed  .  oxyCODONE-acetaminophen (PERCOCET) 5-325 MG tablet Take 1 tablet by mouth every 6 (six) hours as needed. (Patient not taking: Reported on 08/01/2019)   . polyethylene glycol powder (GLYCOLAX/MIRALAX) 17 GM/SCOOP powder Take 17 g by mouth daily as needed for mild constipation or moderate constipation.    No facility-administered encounter medications on file as of 08/01/2019.    ALLERGIES:  Allergies  Allergen Reactions  . Lipitor [Atorvastatin]     Muscles aches     PHYSICAL EXAM:  ECOG Performance status: 1  Vitals:   08/01/19 1107  BP: 122/68  Pulse: 87  Resp: 18  Temp: (!) 96.8 F (36 C)  SpO2: 97%   Filed Weights   08/01/19 1107  Weight: 147 lb (66.7 kg)   Physical Exam Constitutional:      Appearance: Normal appearance. He is normal weight.  Cardiovascular:     Rate and Rhythm: Normal rate and regular rhythm.     Heart sounds: Normal heart sounds.  Pulmonary:     Effort: Pulmonary effort is normal.     Breath sounds: Normal breath sounds.  Abdominal:     General: Bowel sounds are normal.     Palpations: Abdomen is soft.  Musculoskeletal:        General: Normal range of motion.  Skin:    General: Skin is warm.  Neurological:     Mental Status: He is alert and oriented to person, place, and time. Mental status is at baseline.  Psychiatric:        Mood and Affect: Mood normal.        Behavior: Behavior normal.        Thought Content: Thought content normal.        Judgment: Judgment normal.      LABORATORY DATA:  I have reviewed the labs as listed.  CBC    Component Value Date/Time   WBC 7.2 07/25/2019 0538   RBC 3.73 (L) 07/25/2019 0538   HGB 11.7 (L) 07/25/2019 0538   HGB 14.2 11/13/2018 1019   HCT 36.0 (L) 07/25/2019 0538   HCT 41.3 11/13/2018 1019   PLT 456 (H) 07/25/2019 0538   PLT 260 11/13/2018 1019   MCV 96.5 07/25/2019 0538   MCV 90 11/13/2018 1019   MCH 31.4 07/25/2019 0538   MCHC 32.5 07/25/2019 0538   RDW 15.5 07/25/2019 0538   RDW  13.3 11/13/2018 1019   LYMPHSABS 0.5 (L) 07/22/2019 1315   LYMPHSABS 2.0 11/13/2018 1019   MONOABS 0.5 07/22/2019 1315   EOSABS 0.0 07/22/2019 1315   EOSABS 0.2 11/13/2018 1019   BASOSABS 0.0 07/22/2019 1315   BASOSABS 0.1 11/13/2018 1019   CMP Latest Ref Rng & Units 07/25/2019 07/24/2019 07/23/2019  Glucose 70 - 99 mg/dL 106(H) 117(H) 91  BUN 8 - 23 mg/dL 12 10 7(L)  Creatinine 0.61 - 1.24 mg/dL 0.54(L) 0.44(L) 0.55(L)  Sodium 135 - 145 mmol/L 132(L) 132(L) 131(L)  Potassium 3.5 - 5.1 mmol/L 4.1 3.9 3.7  Chloride 98 - 111 mmol/L 97(L) 98 99  CO2 22 - 32 mmol/L _0 Calcium 8.9 - 10.3 mg/dL 8.0(L) 7.8(L) 7.4(L)  Total Protein 6.5 - 8.1 g/dL - - 5.5(L)  Total Bilirubin 0.3 - 1.2 mg/dL - - 0.5  Alkaline Phos 38 - 126 U/L - - 414(H)  AST 15 - 41 U/L - - 17  ALT 0 - 44 U/L - - 33    All questions were answered to patient's stated satisfaction. Encouraged patient to call with any new concerns or questions before his next visit to the cancer center and we can certain see him sooner, if needed.     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  Prostate cancer: -History of prostate cancer diagnosed 08/28/2014, Gleason 4+4 = 8, status post radiation therapy and seed implants. -Lower back pain for the last several months, worse in the last 3 weeks leading to hospitalization. -PSA on admission on 06/19/2019 was 239, testosterone 210.  Alkaline phos elevated at 306. -MRI of the cervical, thoracic and lumbar spine on 06/27/2019 showed diffuse bone metastatic disease.  Mild to moderate compression deformity of T3 with mild ventral epidural extension.  No cord compression.  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 -CT angio of the chest, abdomen and pelvis on 06/27/2019 showed multiple lung nodules bilaterally, largest in the left upper lobe measuring 2.5 x 1.6 x 1.5 cm with associated  left hilar and AP window lymphadenopathy. -Navigational bronchoscopy and biopsy on 07/02/2019 with left upper lobe brushing and washing showing atypical cells.  10 L FNA also showing atypical cells. -Denies any weight loss or GI symptoms. -He smoked a pack per day for 4 to 5 years and quit 60 years ago.  He is a [place] War veteran and had agent orange exposure. -We will give him Lupron 45 mg intramuscularly as soon as we can get insurance authorization. -As the findings are concerning for primary bronchogenic carcinoma, we will obtain a PET scan and he will follow-up with Dr. Delton Coombes afterwards.  2.  Lower back pain: -Reports lower back pain radiating down the posterior right thigh. -MRI showed mild compression deformity of the L4 with ventral epidural extension resulting in severe canal stenosis and crowding of the cauda equina. -10 treatments of radiation therapy completed on 07/11/2019.  Did not notice any improvement in the pain yet. -He is taking Dilaudid 4 mg every 4 hours even during the night.  He was told to take stool softeners with these. -He was just released from the hospital and was given 25 mg fentanyl patches to use as breakthrough pain.  3.  Urinary retention: -He has an indwelling Foley catheter.  4.  Right leg swelling: -He has distal swelling of the right ankle for the last 1 to 2  days.  Denies any pain.  No pleuritic chest pain. -Doppler done on 07/12/2019 which showed no DVT.     Orders placed this encounter:  Orders Placed This Encounter  Procedures  . PSA  . Lactate dehydrogenase  . CBC with Differential/Platelet  . Comprehensive metabolic panel      Francene Finders, FNP-C [place] [phone]

## 2019-08-01 NOTE — Telephone Encounter (Signed)
Phoned patient to inquire about status since completing radiation. Phoned [facility] who informed this RN the he is no longer a patient there. Phoned patient's home. No answer. Left message requesting return call and provided direct number. Awaiting call back.

## 2019-08-02 NOTE — Progress Notes (Signed)
Letter sent of labs results

## 2019-08-06 DIAGNOSIS — R918 Other nonspecific abnormal finding of lung field: Secondary | ICD-10-CM | POA: Diagnosis not present

## 2019-08-06 DIAGNOSIS — M5440 Lumbago with sciatica, unspecified side: Secondary | ICD-10-CM | POA: Diagnosis not present

## 2019-08-10 ENCOUNTER — Other Ambulatory Visit: Payer: Self-pay

## 2019-08-10 ENCOUNTER — Ambulatory Visit (HOSPITAL_COMMUNITY)
Admission: RE | Admit: 2019-08-10 | Discharge: 2019-08-10 | Disposition: A | Discharge status: Home / Self Care | Payer: Medicare Other | Source: Ambulatory Visit | Attending: Hematology | Admitting: Hematology

## 2019-08-10 DIAGNOSIS — R599 Enlarged lymph nodes, unspecified: Secondary | ICD-10-CM

## 2019-08-10 DIAGNOSIS — R918 Other nonspecific abnormal finding of lung field: Secondary | ICD-10-CM | POA: Diagnosis not present

## 2019-08-10 DIAGNOSIS — K5730 Diverticulosis of large intestine without perforation or abscess without bleeding: Secondary | ICD-10-CM | POA: Insufficient documentation

## 2019-08-10 DIAGNOSIS — R911 Solitary pulmonary nodule: Secondary | ICD-10-CM

## 2019-08-10 DIAGNOSIS — I700 Atherosclerosis of aorta: Secondary | ICD-10-CM | POA: Insufficient documentation

## 2019-08-10 DIAGNOSIS — C801 Malignant (primary) neoplasm, unspecified: Secondary | ICD-10-CM

## 2019-08-10 DIAGNOSIS — C7951 Secondary malignant neoplasm of bone: Secondary | ICD-10-CM | POA: Diagnosis not present

## 2019-08-10 DIAGNOSIS — I77810 Thoracic aortic ectasia: Secondary | ICD-10-CM | POA: Insufficient documentation

## 2019-08-10 DIAGNOSIS — Z8546 Personal history of malignant neoplasm of prostate: Secondary | ICD-10-CM | POA: Insufficient documentation

## 2019-08-10 DIAGNOSIS — I2510 Atherosclerotic heart disease of native coronary artery without angina pectoris: Secondary | ICD-10-CM | POA: Diagnosis not present

## 2019-08-10 DIAGNOSIS — R591 Generalized enlarged lymph nodes: Secondary | ICD-10-CM | POA: Diagnosis not present

## 2019-08-10 LAB — GLUCOSE, CAPILLARY: Glucose-Capillary: 132 mg/dL — ABNORMAL HIGH (ref 70–99)

## 2019-08-10 IMAGING — CT NM PET TUM IMG INITIAL (PI) SKULL BASE T - THIGH
1 of 8 series · 1 of 25 positions shown · non-contrast
Comparison: [DATE] CT angiogram of the chest, abdomen and
pelvis. Spinal MRI [DATE].

CLINICAL DATA: Initial treatment strategy for pulmonary nodules
with dominant left upper lobe nodule, mediastinal adenopathy and
bone metastases. No additional bronchoscopy and biopsy [DATE]
demonstrated atypical cells. History of Gleason 8 prostate cancer
diagnosed in [74] and treated with radiation therapy. PSA 239 on
[DATE]. Patient received palliative radiotherapy to painful L4
vertebral metastasis.

EXAM:
NUCLEAR MEDICINE PET SKULL BASE TO THIGH
TECHNIQUE: 7.3 mCi F-18 FDG was injected intravenously. Full-ring PET imaging
was performed from the skull base to thigh after the radiotracer. CT
data was obtained and used for attenuation correction and anatomic
localization.
Fasting blood glucose: 132 mg/dl

[Series 4: ct sk_thigh 5.0 b31f · axial · 5.0mm · 0.98mm/px · 1 of 216 slices shown]
[im 216/216  brain]
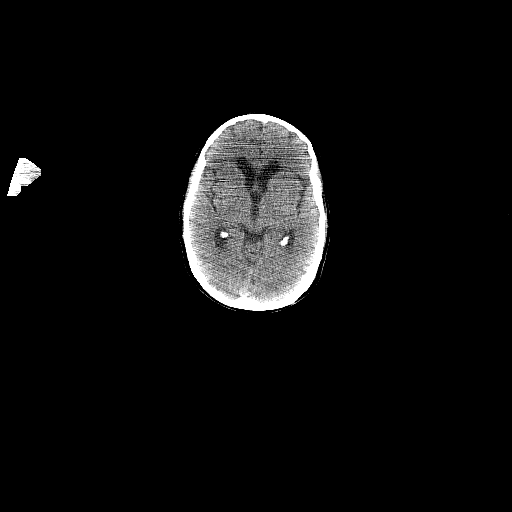

[1 of 25 positions shown; findings below may reference images not displayed]

FINDINGS: Mediastinal blood pool activity: SUV max

Liver activity: SUV max NA

NECK: No hypermetabolic lymph nodes in the neck.

Incidental CT findings: none

CHEST: No enlarged or hypermetabolic axillary, mediastinal or hilar
lymph nodes. Previously visualized AP window and left hilar
lymphadenopathy on [DATE] chest CT angiogram study has resolved.

No hypermetabolic pulmonary findings. Dominant 1.7 cm left upper
lobe pulmonary nodule (series 8/image 37), decreased in size from
2.5 cm and decreased in density, with no significant FDG uptake (max
SUV 0.9). Additional scattered small pulmonary nodules in both lungs
are below PET resolution and are stable to decreased in size since
[DATE] chest CT angiogram study, for example 0.6 cm medial left
lower lobe nodule (series 8/image 50), previously 0.9 cm, a and
apical left upper lobe 0.3 cm nodule (series 8/image 23), previously
0.3 cm. No new significant pulmonary nodules.

Incidental CT findings: Coronary atherosclerosis. Atherosclerotic
thoracic aorta with stable ectatic 4.1 cm ascending thoracic aorta.

ABDOMEN/PELVIS: No abnormal hypermetabolic activity within the
liver, pancreas, adrenal glands, or spleen. No hypermetabolic lymph
nodes in the abdomen or pelvis.

Incidental CT findings: Simple subcentimeter lateral segment left
liver cyst. Atherosclerotic nonaneurysmal abdominal aorta. Moderate
sigmoid diverticulosis. Fiducial markers in the prostate.

SKELETON: Widespread sclerotic osseous metastases throughout the
spine, right greater than left pelvic girdle and left proximal femur
demonstrate mild hypermetabolism and are increased in sclerosis
since [DATE] CT angiogram study. Representative T11 sclerotic
vertebral metastasis with max SUV 3.3. Representative medial right
iliac wing sclerotic osseous metastasis with max SUV 3.1.
Representative proximal left femoral metaphysis sclerotic osseous
metastasis with max SUV 2.0.

Incidental CT findings: none
IMPRESSION: 1. Mildly hypermetabolic widespread sclerotic osseous metastases
throughout the axial and proximal appendicular skeleton, which are
increased in sclerosis since [DATE] CT angiogram study, which
could represent response to therapy if the patient has received
interval systemic therapy for prostate cancer.
2. No hypermetabolic lymphadenopathy. Left mediastinal and left
hilar lymphadenopathy seen on [DATE] CT angiogram study has
resolved. Bilateral pulmonary nodules are stable to decreased in
size since [DATE] CT study and are non hypermetabolic. Dominant
left upper lobe pulmonary nodule has decreased from 2.5 cm to
cm. Findings could represent response to therapy of prostate cancer
thoracic metastases if the patient has received interval systemic
therapy for prostate cancer.
3. Chronic findings include: Aortic Atherosclerosis ([74]-[74]).
Ectatic 4.1 cm ascending thoracic aorta. Coronary atherosclerosis.
Moderate sigmoid diverticulosis.

## 2019-08-10 MED ORDER — FLUDEOXYGLUCOSE F - 18 (FDG) INJECTION
7.3100 | Freq: Once | INTRAVENOUS | Status: AC | PRN
  Administered 2019-08-10: 7.31 via INTRAVENOUS

## 2019-08-14 ENCOUNTER — Inpatient Hospital Stay (HOSPITAL_COMMUNITY): Payer: Medicare Other

## 2019-08-14 ENCOUNTER — Other Ambulatory Visit: Payer: Self-pay

## 2019-08-14 ENCOUNTER — Inpatient Hospital Stay (HOSPITAL_COMMUNITY): Payer: Medicare Other | Admitting: Hematology

## 2019-08-14 VITALS — BP 162/75 | HR 65 | Temp 97.8°F | Resp 18 | Wt 144.0 lb

## 2019-08-14 DIAGNOSIS — C61 Malignant neoplasm of prostate: Secondary | ICD-10-CM | POA: Diagnosis not present

## 2019-08-14 DIAGNOSIS — C7951 Secondary malignant neoplasm of bone: Secondary | ICD-10-CM

## 2019-08-14 DIAGNOSIS — E039 Hypothyroidism, unspecified: Secondary | ICD-10-CM | POA: Diagnosis not present

## 2019-08-14 DIAGNOSIS — R338 Other retention of urine: Secondary | ICD-10-CM | POA: Diagnosis not present

## 2019-08-14 LAB — CBC WITH DIFFERENTIAL/PLATELET
Abs Immature Granulocytes: 0.1 10*3/uL — ABNORMAL HIGH (ref 0.00–0.07)
Basophils Absolute: 0 10*3/uL (ref 0.0–0.1)
Basophils Relative: 0 %
Eosinophils Absolute: 0 10*3/uL (ref 0.0–0.5)
Eosinophils Relative: 0 %
HCT: 42.2 % (ref 39.0–52.0)
Hemoglobin: 13.7 g/dL (ref 13.0–17.0)
Immature Granulocytes: 1 %
Lymphocytes Relative: 3 %
Lymphs Abs: 0.3 10*3/uL — ABNORMAL LOW (ref 0.7–4.0)
MCH: 31.6 pg (ref 26.0–34.0)
MCHC: 32.5 g/dL (ref 30.0–36.0)
MCV: 97.2 fL (ref 80.0–100.0)
Monocytes Absolute: 0.4 10*3/uL (ref 0.1–1.0)
Monocytes Relative: 4 %
Neutro Abs: 8.6 10*3/uL — ABNORMAL HIGH (ref 1.7–7.7)
Neutrophils Relative %: 92 %
Platelets: 181 10*3/uL (ref 150–400)
RBC: 4.34 MIL/uL (ref 4.22–5.81)
RDW: 15 % (ref 11.5–15.5)
WBC: 9.4 10*3/uL (ref 4.0–10.5)
nRBC: 0 % (ref 0.0–0.2)

## 2019-08-14 LAB — LACTATE DEHYDROGENASE: LDH: 197 U/L — ABNORMAL HIGH (ref 98–192)

## 2019-08-14 LAB — COMPREHENSIVE METABOLIC PANEL
ALT: 27 U/L (ref 0–44)
AST: 19 U/L (ref 15–41)
Albumin: 3.6 g/dL (ref 3.5–5.0)
Alkaline Phosphatase: 191 U/L — ABNORMAL HIGH (ref 38–126)
Anion gap: 10 (ref 5–15)
BUN: 14 mg/dL (ref 8–23)
CO2: 29 mmol/L (ref 22–32)
Calcium: 8.7 mg/dL — ABNORMAL LOW (ref 8.9–10.3)
Chloride: 94 mmol/L — ABNORMAL LOW (ref 98–111)
Creatinine, Ser: 0.55 mg/dL — ABNORMAL LOW (ref 0.61–1.24)
GFR calc Af Amer: >60 mL/min (ref >60)
GFR calc non Af Amer: >60 mL/min (ref >60)
Glucose, Bld: 117 mg/dL — ABNORMAL HIGH (ref 70–99)
Potassium: 4.7 mmol/L (ref 3.5–5.1)
Sodium: 133 mmol/L — ABNORMAL LOW (ref 135–145)
Total Bilirubin: 0.4 mg/dL (ref 0.3–1.2)
Total Protein: 6.6 g/dL (ref 6.5–8.1)

## 2019-08-14 LAB — PSA: Prostatic Specific Antigen: 11.96 ng/mL — ABNORMAL HIGH (ref 0.00–4.00)

## 2019-08-14 NOTE — Patient Instructions (Addendum)
[place] at [facility] Discharge Instructions  You were seen today by Dr. Delton Coombes. He went over your recent results. The medications and the side effects were discussed today. Please take Leupron on an empty stomach. Stop taking the bicalutamide. Decrease your intake of the pain medications. Dr. Delton Coombes will see you back in 4 weeks for labs and follow up.   Thank you for choosing [place] at [facility] to provide your oncology and hematology care.  To afford each patient quality time with our provider, please arrive at least 15 minutes before your scheduled appointment time.   If you have a lab appointment with the [facility] please come in thru the Main Entrance and check in at the main information desk  You need to re-schedule your appointment should you arrive 10 or more minutes late.  We strive to give you quality time with our providers, and arriving late affects you and other patients whose appointments are after yours.  Also, if you no show three or more times for appointments you may be dismissed from the clinic at the providers discretion.     Again, thank you for choosing [facility].  Our hope is that these requests will decrease the amount of time that you wait before being seen by our physicians.       _____________________________________________________________  Should you have questions after your visit to [facility], please contact our office at [phone] between the hours of 8:00 a.m. and 4:30 p.m.  Voicemails left after 4:00 p.m. will not be returned until the following business day.  For prescription refill requests, have your pharmacy contact our office and allow 72 hours.    Cancer Center Support Programs:   > Cancer Support Group  2nd Tuesday of the month 1pm-2pm, Journey Room

## 2019-08-15 ENCOUNTER — Ambulatory Visit: Payer: Medicare Other

## 2019-08-15 DIAGNOSIS — Z7189 Other specified counseling: Secondary | ICD-10-CM | POA: Diagnosis not present

## 2019-08-16 ENCOUNTER — Other Ambulatory Visit (HOSPITAL_COMMUNITY): Payer: Self-pay | Admitting: *Deleted

## 2019-08-16 DIAGNOSIS — C7951 Secondary malignant neoplasm of bone: Secondary | ICD-10-CM

## 2019-08-16 DIAGNOSIS — C61 Malignant neoplasm of prostate: Secondary | ICD-10-CM

## 2019-08-17 ENCOUNTER — Other Ambulatory Visit: Payer: Self-pay

## 2019-08-17 ENCOUNTER — Ambulatory Visit: Payer: Medicare Other

## 2019-08-19 ENCOUNTER — Other Ambulatory Visit (HOSPITAL_COMMUNITY): Payer: Self-pay | Admitting: Hematology

## 2019-08-19 MED ORDER — PREDNISONE 5 MG PO TABS: 5.0000 mg | ORAL_TABLET | Freq: Every day | ORAL | 6 refills | Status: DC

## 2019-08-19 MED ORDER — ABIRATERONE ACETATE 250 MG PO TABS: 1000.0000 mg | ORAL_TABLET | Freq: Every day | ORAL | 0 refills | Status: DC

## 2019-08-19 NOTE — Progress Notes (Signed)
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  PRIOR THERAPY: Radiation therapy and seed implants.  NGS Results: Pending.  CURRENT THERAPY: Lupron  BRIEF ONCOLOGIC HISTORY:  Oncology History   No history exists.    CANCER STAGING: Cancer Staging No matching staging information was found for the patient.   INTERVAL HISTORY:  [name] 79 y.o. male seen for follow-up of metastatic prostate cancer.  Reports appetite of 50%.  Energy levels are 25%.  Reports back pain is 8 out of 10 when it is worse.  Taking Dilaudid 1 tablet every 4 hours.  He was also started on fentanyl patch.  Mild constipation and occasional nausea was reported.  Denies any vomiting.    REVIEW OF SYSTEMS:  Review of Systems  Gastrointestinal: Positive for constipation and nausea.  Neurological: Positive for dizziness and numbness.  All other systems reviewed and are negative.    PAST MEDICAL/SURGICAL HISTORY:  Past Medical History:  Diagnosis Date  . Agent orange exposure   . Arthritis   . Coronary artery disease    BMS to mid LAD 2001, DES to proximal LAD 2017  . Enlarged prostate    XRT 2016  . Headache    Sinus headaches   . Heart abnormality   . History of depression   . Hyperlipidemia   . Hypothyroidism   . Prostate cancer ([place]) 07/2014   hormonal therapy, external beam radiation therapy  . PTSD (post-traumatic stress disorder)    Past Surgical History:  Procedure Laterality Date  . BRONCHIAL BRUSHINGS  07/02/2019   Procedure: BRONCHIAL BRUSHINGS;  Surgeon: Rigoberto Noel, MD;  Location: [facility];  Service: Cardiopulmonary;;  . BRONCHIAL NEEDLE ASPIRATION BIOPSY  07/02/2019   Procedure: BRONCHIAL NEEDLE ASPIRATION BIOPSIES;  Surgeon: Rigoberto Noel, MD;   Location: [facility];  Service: Cardiopulmonary;;  . BRONCHIAL WASHINGS  07/02/2019   Procedure: BRONCHIAL WASHINGS;  Surgeon: Rigoberto Noel, MD;  Location: [facility];  Service: Cardiopulmonary;;  . CARDIAC CATHETERIZATION N/A 12/12/2015   Procedure: Left Heart Cath and Coronary Angiography;  Surgeon: Peter M Martinique, MD;  Location: [facility];  Service: Cardiovascular;  Laterality: N/A;  . CARDIAC CATHETERIZATION N/A 12/12/2015   Procedure: Coronary Stent Intervention;  Surgeon: Peter M Martinique, MD;  Location: [facility];  Service: Cardiovascular;  Laterality: N/A;  . CORONARY STENT INTERVENTION N/A 09/06/2017   Procedure: CORONARY STENT INTERVENTION;  Surgeon: Burnell Blanks, MD;  Location: [facility];  Service: Cardiovascular;  Laterality: N/A;  . ENDOBRONCHIAL ULTRASOUND N/A 07/02/2019   Procedure: ENDOBRONCHIAL ULTRASOUND;  Surgeon: Rigoberto Noel, MD;  [facility];  Service: Cardiopulmonary;  Laterality: N/A;  . FLEXIBLE BRONCHOSCOPY  07/02/2019   Procedure: FLEXIBLE BRONCHOSCOPY;  Surgeon: Rigoberto Noel, MD;  Location: [facility];  Service: Cardiopulmonary;;  . HERNIA REPAIR Right   . LEFT HEART CATH AND CORONARY ANGIOGRAPHY N/A 09/02/2016   Procedure: Left Heart Cath and Coronary Angiography;  Surgeon: Leonie Man, MD;  Location: [facility];  Service: Cardiovascular;  Laterality: N/A;  . LEFT HEART CATH AND CORONARY ANGIOGRAPHY N/A 09/06/2017   Procedure: LEFT HEART CATH AND CORONARY ANGIOGRAPHY;  Surgeon: Burnell Blanks, MD;  Location: [facility];  Service: Cardiovascular;  Laterality: N/A;  .  LEFT HEART CATH AND CORONARY ANGIOGRAPHY N/A 05/04/2019   Procedure: LEFT HEART CATH AND CORONARY ANGIOGRAPHY;  Surgeon: Martinique, Peter M, MD;  Location: [facility];  Service: Cardiovascular;  Laterality: N/A;  . PROSTATE BIOPSY N/A 08/28/2014   Procedure: BIOPSY TRANSRECTAL ULTRASONIC PROSTATE (TUBP);  Surgeon: Rana Snare,  MD;  Location: [facility];  Service: Urology;  Laterality: N/A;  . TRANSURETHRAL RESECTION OF PROSTATE N/A 08/28/2014   Procedure: TRANSURETHRAL RESECTION OF THE PROSTATE WITH GYRUS INSTRUMENTS;  Surgeon: Rana Snare, MD;  Location: [facility];  Service: Urology;  Laterality: N/A;     SOCIAL HISTORY:  Social History   Socioeconomic History  . Marital status: Married    Spouse name: Not on file  . Number of children: 5  . Years of education: Not on file  . Highest education level: Not on file  Occupational History  . Occupation: retired  Tobacco Use  . Smoking status: Never Smoker  . Smokeless tobacco: Former Systems developer    Types: Secondary school teacher  . Vaping Use: Never used  Substance and Sexual Activity  . Alcohol use: No  . Drug use: No  . Sexual activity: Yes  Other Topics Concern  . Not on file  Social History Narrative   Married   5 children   Social Determinants of Health   Financial Resource Strain: Low Risk   . Difficulty of Paying Living Expenses: Not hard at all  Food Insecurity: No Food Insecurity  . Worried About Charity fundraiser in the Last Year: Never true  . Ran Out of Food in the Last Year: Never true  Transportation Needs: No Transportation Needs  . Lack of Transportation (Medical): No  . Lack of Transportation (Non-Medical): No  Physical Activity: Inactive  . Days of Exercise per Week: 0 days  . Minutes of Exercise per Session: 0 min  Stress: No Stress Concern Present  . Feeling of Stress : Not at all  Social Connections: Moderately Isolated  . Frequency of Communication with Friends and Family: More than three times a week  . Frequency of Social Gatherings with Friends and Family: More than three times a week  . Attends Religious Services: Never  . Active Member of Clubs or Organizations: No  . Attends Archivist Meetings: Never  . Marital Status: Married  Human resources officer Violence: Not At Risk  . Fear of Current or Ex-Partner: No  . Emotionally  Abused: No  . Physically Abused: No  . Sexually Abused: No    FAMILY HISTORY:  Family History  Problem Relation Age of Onset  . Arthritis Mother   . Heart attack Father     CURRENT MEDICATIONS:  Outpatient Encounter Medications as of 08/14/2019  Medication Sig Note  . amLODipine (NORVASC) 5 MG tablet Take 1 tablet (5 mg total) by mouth daily.   Marland Kitchen aspirin EC 81 MG tablet Take 1 tablet (81 mg total) by mouth daily.   . bicalutamide (CASODEX) 50 MG tablet Take 1 tablet (50 mg total) by mouth daily.   Marland Kitchen dexamethasone (DECADRON) 4 MG tablet Take 1 tablet (4 mg total) by mouth 2 (two) times daily with a meal.   . diclofenac sodium (VOLTAREN) 1 % GEL APPLY 2 GRAMS TOPICALLY 4 TIMES DAILY   . DULoxetine (CYMBALTA) 60 MG capsule Take 60 mg by mouth daily.   . fentaNYL (DURAGESIC) 25 MCG/HR Place 1 patch onto the skin every 3 (three) days.   . folic acid (FOLVITE) 1  MG tablet Take 1 tablet (1 mg total) by mouth daily.   Marland Kitchen gabapentin (NEURONTIN) 400 MG capsule Take 1 capsule (400 mg total) by mouth 3 (three) times daily.   . pantoprazole (PROTONIX) 40 MG tablet Take 1 tablet (40 mg total) by mouth daily.   . tamsulosin (FLOMAX) 0.4 MG CAPS capsule Take 0.4 mg by mouth daily.   Marland Kitchen thiamine 100 MG tablet Take 1 tablet (100 mg total) by mouth daily.   Marland Kitchen abiraterone acetate (ZYTIGA) 250 MG tablet Take 4 tablets (1,000 mg total) by mouth daily. Take on an empty stomach 1 hour before or 2 hours after a meal   . ALPRAZolam (XANAX) 0.5 MG tablet Take 1 tablet (0.5 mg total) by mouth 2 (two) times daily as needed for anxiety. (Patient not taking: Reported on 08/01/2019)   . HYDROmorphone (DILAUDID) 4 MG tablet Take 1 tablet (4 mg total) by mouth every 6 (six) hours as needed for severe pain. (Patient not taking: Reported on 08/14/2019)   . meclizine (ANTIVERT) 25 MG tablet Take 1 tablet (25 mg total) by mouth 2 (two) times daily as needed for dizziness. (Patient not taking: Reported on 08/14/2019)   . NARCAN 4  MG/0.1ML LIQD nasal spray kit CALL 911. ADMINISTER A SINGLE SPRAY OF NARCAN IN ONE NOSTRIL. REPEAT EVERY 3 MINUTES AS NEEDED IF NO OR MINIMAL RESPONSE. (Patient not taking: Reported on 08/14/2019)   . nitroGLYCERIN (NITROSTAT) 0.4 MG SL tablet Place 1 tablet (0.4 mg total) under the tongue every 5 (five) minutes as needed for chest pain. X 3 doses (Patient not taking: Reported on 08/01/2019) 06/27/2019: On hand if needed  . polyethylene glycol powder (GLYCOLAX/MIRALAX) 17 GM/SCOOP powder Take 17 g by mouth daily as needed for mild constipation or moderate constipation. (Patient not taking: Reported on 08/14/2019)   . predniSONE (DELTASONE) 5 MG tablet Take 1 tablet (5 mg total) by mouth daily with breakfast.   . [DISCONTINUED] oxyCODONE-acetaminophen (PERCOCET) 5-325 MG tablet Take 1 tablet by mouth every 6 (six) hours as needed. (Patient not taking: Reported on 08/01/2019)    No facility-administered encounter medications on file as of 08/14/2019.    ALLERGIES:  Allergies  Allergen Reactions  . Lipitor [Atorvastatin]     Muscles aches     PHYSICAL EXAM:  ECOG Performance status: 1  Vitals:   08/14/19 1151  BP: (!) 162/75  Pulse: 65  Resp: 18  Temp: 97.8 F (36.6 C)  SpO2: 98%   Filed Weights   08/14/19 1151  Weight: 144 lb (65.3 kg)   Physical Exam Vitals reviewed.  Constitutional:      Appearance: Normal appearance.  Cardiovascular:     Rate and Rhythm: Normal rate and regular rhythm.     Heart sounds: Normal heart sounds.  Pulmonary:     Effort: Pulmonary effort is normal.     Breath sounds: Normal breath sounds.  Abdominal:     General: There is no distension.     Palpations: Abdomen is soft.  Skin:    General: Skin is warm.  Neurological:     General: No focal deficit present.     Mental Status: He is alert and oriented to person, place, and time.  Psychiatric:        Mood and Affect: Mood normal.        Behavior: Behavior normal.      LABORATORY DATA:  I have  reviewed the labs as listed.  CBC    Component Value Date/Time  WBC 9.4 08/14/2019 1045   RBC 4.34 08/14/2019 1045   HGB 13.7 08/14/2019 1045   HGB 14.2 11/13/2018 1019   HCT 42.2 08/14/2019 1045   HCT 41.3 11/13/2018 1019   PLT 181 08/14/2019 1045   PLT 260 11/13/2018 1019   MCV 97.2 08/14/2019 1045   MCV 90 11/13/2018 1019   MCH 31.6 08/14/2019 1045   MCHC 32.5 08/14/2019 1045   RDW 15.0 08/14/2019 1045   RDW 13.3 11/13/2018 1019   LYMPHSABS 0.3 (L) 08/14/2019 1045   LYMPHSABS 2.0 11/13/2018 1019   MONOABS 0.4 08/14/2019 1045   EOSABS 0.0 08/14/2019 1045   EOSABS 0.2 11/13/2018 1019   BASOSABS 0.0 08/14/2019 1045   BASOSABS 0.1 11/13/2018 1019   CMP Latest Ref Rng & Units 08/14/2019 07/25/2019 07/24/2019  Glucose 70 - 99 mg/dL 117(H) 106(H) 117(H)  BUN 8 - 23 mg/dL _0 Creatinine 0.61 - 1.24 mg/dL 0.55(L) 0.54(L) 0.44(L)  Sodium 135 - 145 mmol/L 133(L) 132(L) 132(L)  Potassium 3.5 - 5.1 mmol/L 4.7 4.1 3.9  Chloride 98 - 111 mmol/L 94(L) 97(L) 98  CO2 22 - 32 mmol/L _1 Calcium 8.9 - 10.3 mg/dL 8.7(L) 8.0(L) 7.8(L)  Total Protein 6.5 - 8.1 g/dL 6.6 - -  Total Bilirubin 0.3 - 1.2 mg/dL 0.4 - -  Alkaline Phos 38 - 126 U/L 191(H) - -  AST 15 - 41 U/L 19 - -  ALT 0 - 44 U/L 27 - -    DIAGNOSTIC IMAGING:  I have independently reviewed the scans and discussed with the patient.  ASSESSMENT:  1.  Metastatic castration sensitive prostate cancer: -History of prostate cancer diagnosed 08/28/2014, Gleason 4+4= 8, status post XRT and seed implants. -Presentation with low back pain of several months, PSA on 06/19/2019 was 239, testosterone 210, alkaline phosphatase 306. -MRI of the cervical, thoracic and lumbar spine on 07/27/2019 shows diffuse bone metastatic disease.  Mild to moderate compression deformity of T3 with mild ventral epidural extension.  No cord compression.  Mild compression deformity of L4 with ventral epidural extension resulting in severe canal stenosis  and crowding of the cauda equina. -CT angio of the chest, abdomen and pelvis on 06/27/2019 shows multiple lung nodules bilaterally, largest in the left upper lobe measuring 2.5 x 1.6 x 1.5 cm with associated left hilar and AP window lymphadenopathy. -Navigational bronchoscopy and biopsy on 07/02/2019 with upper lobe brushing and washing shows atypical cells.  10 L FNA also shows atypical cells. -1 pack/day for 4-5 years, quit 60 years ago.  Positive agent orange exposure -Lupron 45 mg on 07/12/2019 along with 1 month of Casodex. -PET scan on 08/11/2019 shows mildly hypermetabolic widespread sclerotic osseous metastasis throughout the axial and proximal appendicular skeleton, increased sclerosis since 06/27/2018 on CT angiogram.  This could represent response to therapy.  No hypermetabolic lymphadenopathy.  Left mediastinal and left hilar adenopathy is seen on 06/19/2019 has resolved.  Bilateral pulmonary nodules are stable to decreased in size.  Dominant left upper lobe lung nodule has decreased from 2.5 cm to 1.7 cm.  PLAN:  1.  Metastatic castration sensitive prostate cancer: -I have reviewed PET scan images with the patient and his son. -He has gotten very good response with resolution of adenopathy since Lupron was started. -We reviewed labs today.  Alk phos improved to 191.  PSA also improved 11.96. -We talked about initiating him on abiraterone with prednisone.  We also talked about limited duration of chemotherapy with 6 cycles  of docetaxel.  Patient favored abiraterone. -We talked about side effects including but not limited to hypertension, hypokalemia, fatigue among others.  He is agreeable for treatment.  We will proceed with the prescription.  I plan to see him back 2 weeks after start of abiraterone.  2.  Low back pain: -10 treatments of radiation therapy completed on 07/11/2019. -Recent hospitalization, with fentanyl 25 mcg patch started. -I have told him to cut back on Dilaudid 4 mg to 2-3  times a day.  3.  Right leg swelling: -Doppler done on 07/12/2019 was negative.     Orders placed this encounter:  Orders Placed This Encounter  Procedures  . CBC with Differential/Platelet  . Comprehensive metabolic panel    Total time spent is 40 minutes with more than 50% of the time spent face-to-face discussing and reviewing scans, new treatment plan, side effects, counseling and coordination of care.  Derek Jack, MD [place] [phone]

## 2019-08-20 ENCOUNTER — Telehealth (HOSPITAL_COMMUNITY): Payer: Self-pay | Admitting: Pharmacy Technician

## 2019-08-20 NOTE — Telephone Encounter (Signed)
Oral Oncology Patient Advocate Encounter  Prior Authorization for Abiraterone has been approved.    PA# [id number] Effective dates: 08/20/19 through 02/29/20  Patients co-pay is $3.70.  Oral Oncology Clinic will continue to follow.   [place] [place] Patient Keith Olson Phone [phone] Fax [phone] 08/20/2019 9:51 AM

## 2019-08-20 NOTE — Telephone Encounter (Signed)
Oral Oncology Patient Advocate Encounter  Received notification from [employer] that prior authorization for Zytiga (Abiraterone) is required.  PA submitted on CoverMyMeds Key [id number]  Status is pending  Oral Oncology Clinic will continue to follow.  [facility] Patient [place] Phone [phone] Fax [phone] 08/20/2019 8:43 AM

## 2019-08-21 ENCOUNTER — Telehealth (HOSPITAL_COMMUNITY): Payer: Self-pay | Admitting: Pharmacist

## 2019-08-21 ENCOUNTER — Other Ambulatory Visit: Payer: Self-pay

## 2019-08-21 ENCOUNTER — Telehealth: Payer: Self-pay | Admitting: Radiation Oncology

## 2019-08-21 ENCOUNTER — Telehealth: Payer: Self-pay

## 2019-08-21 NOTE — Telephone Encounter (Signed)
Spoke with patient in regards to 1 month telephone follow-up visit with Freeman Caldron PA on 08/22/19 at 2:30pm. Patient verbalized understanding of telephone appointment date and time. Meaningful use and prostate questions reviewed.

## 2019-08-21 NOTE — Progress Notes (Signed)
Patient has 1 month telephone follow-up appointment with Ashlyn Bruning PA. Patient states that he has nocturia 3-4 times per night. Patient states having pain when walking and falls due to the pain. Patient states having urgency. Patient states that urine stream is weak. Patient states that he is not emptying his bladder completely. Patient states that he strains when urinating. Patient denies having leakage.

## 2019-08-21 NOTE — Telephone Encounter (Signed)
Attempting to reach patient to cancel telephone appointment for tomorrow and reschedule for in person appointment on Friday. Also, attempting to reach patient to discuss pain. Left voicemail message on cell phone requesting a return call. Phoned patient's home, got an answer both times but got immediately disconnected. Will await return call.

## 2019-08-21 NOTE — Telephone Encounter (Signed)
Patient's son, Keith Olson, promptly returned my call. He clarified that the patient did not fall. He explains his father resides with him now and got to the top of the four steps he has going into his home when his legs gave way. Keith Olson explains he was able to catch his father preventing him from tumbling down. Keith Olson confirms his father's legs are very weak and he must assist with all transfer. Keith Olson reports his father complains of low back, bilateral hip and posterior upper leg pain. Keith Olson explains it all seemed well controlled until about two weeks ago when he ran out of his hydromorphone. He reports that despite seeing Dr. Jamey Reas his hydromorphone wasn't refilled. Also, Keith Olson explains the [facility] near their home is unable to get the Fentanyl patch ordered from the manufacturer. Clarified that all the patient has for pain management is a Lidocaine patch he applies to his low back and changes every 12 hours. [name] reports the patient is able to control his bowels and bladder. He reports the patient continues to take stool softeners and laxatives to maintain regularity. Keith Olson confirms that his father will be present on Friday, June 25 at 0930 for a nurse eval then 1000 to see [employer], PA-C.

## 2019-08-21 NOTE — Telephone Encounter (Signed)
Oral Oncology Pharmacist Encounter  Received new prescription for Zytiga (abiraterone) for the treatment of metastatic castration sensitive prostate cancer in conjunction with prednisone, planned duration until disease progression or unacceptable drug toxicity.  CMP from 08/14/19 assessed, no relevant lab abnormalities. Prescription dose and frequency assessed.   Current medication list in Epic reviewed, one DDIs with abiraterone identified: -Tamsulosin: abiraterone may increase the serum concentration of tamsulosin. Monitor for increased tamsulosin effects (eg, hypotension, orthostasis). No baseline dose adjustments needed.  Prescription has been e-scribed to the [facility] for benefits analysis and approval.  Oral Oncology Clinic will continue to follow for insurance authorization, copayment issues, initial counseling and start date.  Darl Pikes, PharmD, BCPS, BCOP, CPP Hematology/Oncology Clinical Pharmacist Practitioner [facility] [phone]  08/21/2019 9:18 AM

## 2019-08-22 ENCOUNTER — Ambulatory Visit
Admit: 2019-08-22 | Discharge: 2019-08-22 | Disposition: A | Discharge status: Home / Self Care | Payer: Medicare Other | Attending: Urology | Admitting: Urology

## 2019-08-22 DIAGNOSIS — C61 Malignant neoplasm of prostate: Secondary | ICD-10-CM

## 2019-08-22 MED ORDER — HYDROMORPHONE HCL 4 MG PO TABS: 4.0000 mg | ORAL_TABLET | Freq: Four times a day (QID) | ORAL | 0 refills | Status: DC | PRN

## 2019-08-22 MED ORDER — FENTANYL 25 MCG/HR TD PT72: 1.0000 | MEDICATED_PATCH | TRANSDERMAL | 0 refills | Status: DC

## 2019-08-24 ENCOUNTER — Other Ambulatory Visit: Payer: Self-pay

## 2019-08-24 ENCOUNTER — Ambulatory Visit
Admission: RE | Admit: 2019-08-24 | Discharge: 2019-08-24 | Disposition: A | Discharge status: Home / Self Care | Payer: Medicare Other | Source: Ambulatory Visit | Attending: Radiation Oncology | Admitting: Radiation Oncology

## 2019-08-24 ENCOUNTER — Ambulatory Visit
Admission: RE | Admit: 2019-08-24 | Discharge: 2019-08-24 | Disposition: A | Discharge status: Home / Self Care | Payer: Medicare Other | Source: Ambulatory Visit | Attending: Urology | Admitting: Urology

## 2019-08-24 ENCOUNTER — Encounter: Payer: Self-pay | Admitting: Radiation Oncology

## 2019-08-24 ENCOUNTER — Ambulatory Visit: Admission: RE | Admit: 2019-08-24 | Payer: Medicare Other | Source: Ambulatory Visit | Admitting: Radiation Oncology

## 2019-08-24 VITALS — BP 165/84 | HR 60 | Temp 97.2°F | Resp 20 | Ht 67.5 in | Wt 143.0 lb

## 2019-08-24 DIAGNOSIS — C61 Malignant neoplasm of prostate: Secondary | ICD-10-CM

## 2019-08-24 DIAGNOSIS — C7951 Secondary malignant neoplasm of bone: Secondary | ICD-10-CM

## 2019-08-24 DIAGNOSIS — Z923 Personal history of irradiation: Secondary | ICD-10-CM | POA: Insufficient documentation

## 2019-08-24 NOTE — Progress Notes (Signed)
Patient here today for increase pain in both hips, legs and lower back. Pain states that the pain is a 10+ on the pain scale. Patient states that he has had the pain for a month and it has increased. Patient states pain increases when he applies pressure to his hips. Patient currently is using a pain patch and states that it is not helping.   BP (!) 165/84 (BP Location: Right Arm, Patient Position: Sitting, Cuff Size: Normal)   Pulse 60   Temp (!) 97.2 F (36.2 C)   Resp 20   Ht 5' 7.5" (1.715 m)   Wt 143 lb (64.9 kg)   SpO2 98%   BMI 22.07 kg/m

## 2019-08-24 NOTE — Progress Notes (Signed)
[place]         [phone] ________________________________  Outpatient Follow Up New visit  Name: Keith Olson. MRN: [id number]  Date: 08/24/2019  DOB: 1940-09-03  REFERRING PHYSICIAN: Dettinger, Fransisca Kaufmann, MD  DIAGNOSIS:  79 yo man with a painful osseous metastasis in a background of both prostate cancer and bronchogenic carcinoma.   - Stage IV    ICD-10-CM   1. Prostate cancer metastatic to bone [facility])  C61    C79.51     HISTORY OF PRESENT ILLNESS::Keith Olson. is a 79 y.o. male who with known history prostate cancer, and newly diagnosed bronchogenic cancer, whom has not felt right about the pelvic region for approximately 6 months. On initial presentation in 05/2019, he reported he has had increased pain in his lower back and pelvic region for approximately a month, with a severe exacerbation over last two weeks, and much worse on day of admission. He has had difficulty with bowel movements, and states urination has been difficult also. PSA on admission on 06/27/2019 was 239, testosterone 210.  Alkaline phos elevated at 306.  MRI C, T and L spine at [facility] revealed multiple metastatic lesions in the spine, and pathologic fractures in the thoracic and lumbar spine. Dr. Christella Noa was called for surgical consultation but did not feel that the patient was an operative candidate given extensive spinal involvement and no weakness necessitating urgent decompression.  He reported difficulty ambulating due to pain, not weakness. MRI showed multifocal abnormal marrow signal throughout the lumbosacral spine and imaged bony pelvis with mild compression deformity of L4 with less than 50% loss of height. Ventral epidural disease was also present at this level with resulting severe canal stenosis and crowding of cauda equina.  CT angio of the chest, abdomen and pelvis on 06/27/2019 showed multiple lung nodules bilaterally, with the largest in the left upper lobe measuring 2.5 x  1.6 x 1.5 cm with associated left hilar and AP window lymphadenopathy. He had a navigational bronchoscopy and biopsy on 07/02/2019 with left upper lobe brushing and washing showing atypical cells only.  10 L FNA also showing atypical cells only. He was scheduled for PET scan for further evaluation once he was discharged home but was unable to make the appointment due to pain.  We were consulted at that time for consideration of palliative radiotherapy.  After discussion of treatment opsions, the patient elected to proceed with a 2 week course of palliative radiotherapy to the lumbar spine at L4 which he tolerated well and completed on 07/11/19. Unfortunately, he has had minimal improvement in his pain since completing radiation treatment and was readmitted to the hospital for pain control and treatment of UTI 5/23-5/26 prior to discharge to skilled nursing facility. He is taking Dilaudid 44m and was recently prescribed a Fentanyl patch for pain but unfortunately has been told by his pharmacy that this medication is on backorder and has not been able to fill the prescription.  He has run out of his Dilaudid and is having severe pain that is significantly affecting his QOL.  He reports that when he takes the Dilaudid every 4 hours, the pain is manageable but he has not been able to get in touch with Dr. KDelton Coombesto have this prescription refilled or have his medications adjusted since he is unable to get the Fentanyl patch. His systemic disease is being managed with Lupron ADT under the care and direction of Dr. KDelton Coombesand he understands the importance  of having narcotic pain medications prescribed/managed by a single provider so he is here today requesting our assistance in communicating with Dr. Delton Coombes.  He denies any paraesthesias, issues with bowel/bladder dysfunction, or focal weakness.  He reports that his inability to stand or ambulate is related to his pain with weightbearing, not associated with  weakness.  PREVIOUS RADIATION THERAPY: Yes  11/05/2014-12/30/2014:  The prostate was treated to 78 Gy in 40 fractions of 1.95 Gy each.  06/28/19 - 07/11/19:  The target lesion at L4 in the lumbar spine was treated to 30 Gy in 10 fractions.   Past Medical History:  Diagnosis Date  . Agent orange exposure   . Arthritis   . Coronary artery disease    BMS to mid LAD 2001, DES to proximal LAD 2017  . Enlarged prostate    XRT 2016  . Headache    Sinus headaches   . Heart abnormality   . History of depression   . Hyperlipidemia   . Hypothyroidism   . Prostate cancer ([place]) 07/2014   hormonal therapy, external beam radiation therapy  . PTSD (post-traumatic stress disorder)   :   Past Surgical History:  Procedure Laterality Date  . BRONCHIAL BRUSHINGS  07/02/2019   Procedure: BRONCHIAL BRUSHINGS;  Surgeon: Rigoberto Noel, MD;  Location: [facility];  Service: Cardiopulmonary;;  . BRONCHIAL NEEDLE ASPIRATION BIOPSY  07/02/2019   Procedure: BRONCHIAL NEEDLE ASPIRATION BIOPSIES;  Surgeon: Rigoberto Noel, MD;  [facility]: [facility];  Service: Cardiopulmonary;;  . BRONCHIAL WASHINGS  07/02/2019   Procedure: BRONCHIAL WASHINGS;  Surgeon: Rigoberto Noel, MD;  [facility];  Service: Cardiopulmonary;;  . CARDIAC CATHETERIZATION N/A 12/12/2015   Procedure: Left Heart Cath and Coronary Angiography;  Surgeon: Peter M Martinique, MD;  Location: [place] [facility];  Service: Cardiovascular;  Laterality: N/A;  . CARDIAC CATHETERIZATION N/A 12/12/2015   Procedure: Coronary Stent Intervention;  Surgeon: Peter M Martinique, MD;  Location: [place] [facility];  Service: Cardiovascular;  Laterality: N/A;  . CORONARY STENT INTERVENTION N/A 09/06/2017   Procedure: CORONARY STENT INTERVENTION;  Surgeon: Burnell Blanks, MD;  Location: [place] [facility];  Service: Cardiovascular;  Laterality: N/A;  . ENDOBRONCHIAL ULTRASOUND N/A 07/02/2019   Procedure: ENDOBRONCHIAL ULTRASOUND;  Surgeon: Rigoberto Noel, MD;   [facility];  Service: Cardiopulmonary;  Laterality: N/A;  . FLEXIBLE BRONCHOSCOPY  07/02/2019   Procedure: FLEXIBLE BRONCHOSCOPY;  Surgeon: Rigoberto Noel, MD;  [facility];  Service: Cardiopulmonary;;  . HERNIA REPAIR Right   . LEFT HEART CATH AND CORONARY ANGIOGRAPHY N/A 09/02/2016   Procedure: Left Heart Cath and Coronary Angiography;  Surgeon: Leonie Man, MD;  Location: [place] [facility];  Service: Cardiovascular;  Laterality: N/A;  . LEFT HEART CATH AND CORONARY ANGIOGRAPHY N/A 09/06/2017   Procedure: LEFT HEART CATH AND CORONARY ANGIOGRAPHY;  Surgeon: Burnell Blanks, MD;  Location: [place] [facility];  Service: Cardiovascular;  Laterality: N/A;  . LEFT HEART CATH AND CORONARY ANGIOGRAPHY N/A 05/04/2019   Procedure: LEFT HEART CATH AND CORONARY ANGIOGRAPHY;  Surgeon: Martinique, Peter M, MD;  Location: [place] [facility];  Service: Cardiovascular;  Laterality: N/A;  . PROSTATE BIOPSY N/A 08/28/2014   Procedure: BIOPSY TRANSRECTAL ULTRASONIC PROSTATE (TUBP);  Surgeon: Rana Snare, MD;  [facility];  Service: Urology;  Laterality: N/A;  . TRANSURETHRAL RESECTION OF PROSTATE N/A 08/28/2014   Procedure: TRANSURETHRAL RESECTION OF THE PROSTATE WITH GYRUS INSTRUMENTS;  Surgeon: Rana Snare, MD;  Location:  [facility];  Service: Urology;  Laterality: N/A;  :   Current Outpatient Medications:  .  abiraterone acetate (ZYTIGA) 250 MG tablet, Take 4 tablets (1,000 mg total) by mouth daily. Take on an empty stomach 1 hour before or 2 hours after a meal, Disp: 120 tablet, Rfl: 0 .  ALPRAZolam (XANAX) 0.5 MG tablet, Take 1 tablet (0.5 mg total) by mouth 2 (two) times daily as needed for anxiety., Disp: 10 tablet, Rfl: 0 .  amLODipine (NORVASC) 5 MG tablet, Take 1 tablet (5 mg total) by mouth daily., Disp: 30 tablet, Rfl: 11 .  aspirin EC 81 MG tablet, Take 1 tablet (81 mg total) by mouth daily., Disp: , Rfl:  .  bicalutamide (CASODEX) 50 MG tablet, Take 1 tablet (50 mg total)  by mouth daily., Disp: 30 tablet, Rfl: 0 .  dexamethasone (DECADRON) 4 MG tablet, Take 1 tablet (4 mg total) by mouth 2 (two) times daily with a meal., Disp: 60 tablet, Rfl: 1 .  diclofenac sodium (VOLTAREN) 1 % GEL, APPLY 2 GRAMS TOPICALLY 4 TIMES DAILY, Disp: 100 g, Rfl: 5 .  DULoxetine (CYMBALTA) 60 MG capsule, Take 60 mg by mouth daily., Disp: , Rfl:  .  fentaNYL (DURAGESIC) 25 MCG/HR, Place 1 patch onto the skin every 3 (three) days., Disp: 5 patch, Rfl: 0 .  folic acid (FOLVITE) 1 MG tablet, Take 1 tablet (1 mg total) by mouth daily., Disp:  , Rfl:  .  gabapentin (NEURONTIN) 400 MG capsule, Take 1 capsule (400 mg total) by mouth 3 (three) times daily., Disp: 90 capsule, Rfl: 3 .  HYDROmorphone (DILAUDID) 4 MG tablet, Take 1 tablet (4 mg total) by mouth every 6 (six) hours as needed for severe pain., Disp: 40 tablet, Rfl: 0 .  meclizine (ANTIVERT) 25 MG tablet, Take 1 tablet (25 mg total) by mouth 2 (two) times daily as needed for dizziness., Disp: 30 tablet, Rfl: 0 .  NARCAN 4 MG/0.1ML LIQD nasal spray kit, CALL 911. ADMINISTER A SINGLE SPRAY OF NARCAN IN ONE NOSTRIL. REPEAT EVERY 3 MINUTES AS NEEDED IF NO OR MINIMAL RESPONSE., Disp: , Rfl:  .  nitroGLYCERIN (NITROSTAT) 0.4 MG SL tablet, Place 1 tablet (0.4 mg total) under the tongue every 5 (five) minutes as needed for chest pain. X 3 doses, Disp: 25 tablet, Rfl: 12 .  pantoprazole (PROTONIX) 40 MG tablet, Take 1 tablet (40 mg total) by mouth daily., Disp: 30 tablet, Rfl: 3 .  polyethylene glycol powder (GLYCOLAX/MIRALAX) 17 GM/SCOOP powder, Take 17 g by mouth daily as needed for mild constipation or moderate constipation. , Disp: , Rfl:  .  predniSONE (DELTASONE) 5 MG tablet, Take 1 tablet (5 mg total) by mouth daily with breakfast., Disp: 30 tablet, Rfl: 6 .  tamsulosin (FLOMAX) 0.4 MG CAPS capsule, Take 0.4 mg by mouth daily., Disp: , Rfl:  .  thiamine 100 MG tablet, Take 1 tablet (100 mg total) by mouth daily., Disp:  , Rfl:  :   Allergies  Allergen Reactions  . Lipitor [Atorvastatin]     Muscles aches  :   Family History  Problem Relation Age of Onset  . Arthritis Mother   . Heart attack Father   :   Social History   Socioeconomic History  . Marital status: Married    Spouse name: Not on file  . Number of children: 5  . Years of education: Not on file  . Highest education level: Not on file  Occupational History  .  Occupation: retired  Tobacco Use  . Smoking status: Never Smoker  . Smokeless tobacco: Former Systems developer    Types: Secondary school teacher  . Vaping Use: Never used  Substance and Sexual Activity  . Alcohol use: No  . Drug use: No  . Sexual activity: Yes  Other Topics Concern  . Not on file  Social History Narrative   Married   5 children   Social Determinants of Health   Financial Resource Strain: Low Risk   . Difficulty of Paying Living Expenses: Not hard at all  Food Insecurity: No Food Insecurity  . Worried About Charity fundraiser in the Last Year: Never true  . Ran Out of Food in the Last Year: Never true  Transportation Needs: No Transportation Needs  . Lack of Transportation (Medical): No  . Lack of Transportation (Non-Medical): No  Physical Activity: Inactive  . Days of Exercise per Week: 0 days  . Minutes of Exercise per Session: 0 min  Stress: No Stress Concern Present  . Feeling of Stress : Not at all  Social Connections: Moderately Isolated  . Frequency of Communication with Friends and Family: More than three times a week  . Frequency of Social Gatherings with Friends and Family: More than three times a week  . Attends Religious Services: Never  . Active Member of Clubs or Organizations: No  . Attends Archivist Meetings: Never  . Marital Status: Married  Human resources officer Violence: Not At Risk  . Fear of Current or Ex-Partner: No  . Emotionally Abused: No  . Physically Abused: No  . Sexually Abused: No  :  REVIEW OF SYSTEMS:  A 15 point review  of systems is documented in the electronic medical record. This was obtained by the nursing staff. However, I reviewed this with the patient to discuss relevant findings and make appropriate changes.  Pertinent items are noted in HPI.   PHYSICAL EXAM:  There were no vitals taken for this visit. per neurosurgery -  Constitutional: He is oriented to person, place, and time. He appears well-developed and well-nourished. He appears distressed.  HENT:  Head: Normocephalic and atraumatic.  Right Ear: External ear normal.  Left Ear: External ear normal.  Nose: Nose normal.  Mouth/Throat: Oropharynx is clear and moist.  Eyes: Pupils are equal, round, and reactive to light. Conjunctivae and EOM are normal.  Cardiovascular: Normal rate, regular rhythm and normal heart sounds.  Respiratory: Effort normal and breath sounds normal.  GI: Soft. Bowel sounds are normal.  Musculoskeletal:        General: Normal range of motion.     Cervical back: Normal range of motion and neck supple.  Neurological: He is alert and oriented to person, place, and time. He has normal reflexes. No cranial nerve deficit or sensory deficit is appreciated on exam. He exhibits normal muscle tone. Coordination normal.  Gait not assessed- patient in wheelchair and unable to weight bear due to severe pain in the low back.  Skin: Skin is warm and dry.  Psychiatric: He has a normal mood and affect. His behavior is normal. Judgment and thought content normal.   KPS = 50  100 - Normal; no complaints; no evidence of disease. 90   - Able to carry on normal activity; minor signs or symptoms of disease. 80   - Normal activity with effort; some signs or symptoms of disease. 15   - Cares for self; unable to carry on normal activity or to do  active work. 60   - Requires occasional assistance, but is able to care for most of his personal needs. 50   - Requires considerable assistance and frequent medical care. 80   - Disabled; requires  special care and assistance. 32   - Severely disabled; hospital admission is indicated although death not imminent. 29   - Very sick; hospital admission necessary; active supportive treatment necessary. 10   - Moribund; fatal processes progressing rapidly. 0     - Dead  Karnofsky DA, Abelmann [place], Craver LS and Burchenal [facility] [phone]) The use of the nitrogen mustards in the palliative treatment of carcinoma: with particular reference to bronchogenic carcinoma Cancer 1 634-56  LABORATORY DATA:  Lab Results  Component Value Date   WBC 9.4 08/14/2019   HGB 13.7 08/14/2019   HCT 42.2 08/14/2019   MCV 97.2 08/14/2019   PLT 181 08/14/2019   Lab Results  Component Value Date   NA 133 (L) 08/14/2019   K 4.7 08/14/2019   CL 94 (L) 08/14/2019   CO2 29 08/14/2019   Lab Results  Component Value Date   ALT 27 08/14/2019   AST 19 08/14/2019   ALKPHOS 191 (H) 08/14/2019   BILITOT 0.4 08/14/2019     RADIOGRAPHY: NM PET Image Initial (PI) Skull Base To Thigh  Result Date: 08/11/2019 CLINICAL DATA:  Initial treatment strategy for pulmonary nodules with dominant left upper lobe nodule, mediastinal adenopathy and bone metastases. No additional bronchoscopy and biopsy 07/02/2019 demonstrated atypical cells. History of Gleason 8 prostate cancer diagnosed in 2016 and treated with radiation therapy. PSA 239 on 06/19/2019. Patient received palliative radiotherapy to painful L4 vertebral metastasis. EXAM: NUCLEAR MEDICINE PET SKULL BASE TO THIGH TECHNIQUE: 7.3 mCi F-18 FDG was injected intravenously. Full-ring PET imaging was performed from the skull base to thigh after the radiotracer. CT data was obtained and used for attenuation correction and anatomic localization. Fasting blood glucose: 132 mg/dl COMPARISON:  06/27/2019 CT angiogram of the chest, abdomen and pelvis. Spinal MRI 06/27/2019. FINDINGS: Mediastinal blood pool activity: SUV max 2.2 Liver activity: SUV max NA NECK: No hypermetabolic lymph nodes in  the neck. Incidental CT findings: none CHEST: No enlarged or hypermetabolic axillary, mediastinal or hilar lymph nodes. Previously visualized AP window and left hilar lymphadenopathy on 06/27/2019 chest CT angiogram study has resolved. No hypermetabolic pulmonary findings. Dominant 1.7 cm left upper lobe pulmonary nodule (series 8/image 37), decreased in size from 2.5 cm and decreased in density, with no significant FDG uptake (max SUV 0.9). Additional scattered small pulmonary nodules in both lungs are below PET resolution and are stable to decreased in size since 06/27/2019 chest CT angiogram study, for example 0.6 cm medial left lower lobe nodule (series 8/image 50), previously 0.9 cm, a and apical left upper lobe 0.3 cm nodule (series 8/image 23), previously 0.3 cm. No new significant pulmonary nodules. Incidental CT findings: Coronary atherosclerosis. Atherosclerotic thoracic aorta with stable ectatic 4.1 cm ascending thoracic aorta. ABDOMEN/PELVIS: No abnormal hypermetabolic activity within the liver, pancreas, adrenal glands, or spleen. No hypermetabolic lymph nodes in the abdomen or pelvis. Incidental CT findings: Simple subcentimeter lateral segment left liver cyst. Atherosclerotic nonaneurysmal abdominal aorta. Moderate sigmoid diverticulosis. Fiducial markers in the prostate. SKELETON: Widespread sclerotic osseous metastases throughout the spine, right greater than left pelvic girdle and left proximal femur demonstrate mild hypermetabolism and are increased in sclerosis since 06/27/2019 CT angiogram study. Representative T11 sclerotic vertebral metastasis with max SUV 3.3. Representative medial right iliac wing sclerotic osseous metastasis  with max SUV 3.1. Representative proximal left femoral metaphysis sclerotic osseous metastasis with max SUV 2.0. Incidental CT findings: none IMPRESSION: 1. Mildly hypermetabolic widespread sclerotic osseous metastases throughout the axial and proximal appendicular  skeleton, which are increased in sclerosis since 06/27/2019 CT angiogram study, which could represent response to therapy if the patient has received interval systemic therapy for prostate cancer. 2. No hypermetabolic lymphadenopathy. Left mediastinal and left hilar lymphadenopathy seen on 06/27/2019 CT angiogram study has resolved. Bilateral pulmonary nodules are stable to decreased in size since 06/27/2019 CT study and are non hypermetabolic. Dominant left upper lobe pulmonary nodule has decreased from 2.5 cm to 1.7 cm. Findings could represent response to therapy of prostate cancer thoracic metastases if the patient has received interval systemic therapy for prostate cancer. 3. Chronic findings include: Aortic Atherosclerosis (ICD10-I70.0). 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 Coronary atherosclerosis. Moderate sigmoid diverticulosis. Electronically Signed   By: Ilona Sorrel M.D.   On: 08/11/2019 19:06      IMPRESSION: This is a very nice 79 yo man with a painful L4 metastasis in a background of both prostate cancer and bronchogenic carcinoma.  PLAN:Today, I talked to the patient and his son about the findings and work-up thus far.  Unfortunately, he did not have a significant response to his recent palliative radiation to the lumbar spine.  His systemic disease and associated pain is being managed by Dr. Delton Coombes but he has not been able to get the prescribed pain medications and has not had success in communicating his needs with Dr. Tomie China staff despite multiple attempts. He reports that his pain is manageable with Dilaudid q 4 hours but he is out of his medication and is in severe pain.  He had also been prescribed a Fentanyl patch but this medication is on backorder so he has not been able to have this filled. He and his son present today requesting assistance with communicating with Dr. Delton Coombes regarding the need for better pain control, whether it be through a palliative care referral,  which they are open to, or in changing his longacting pain medication to something other than Fentanyl since he is unable to have this filled at present due to national back order, per his pharmacy. We are happy to assist with this request and feel that he may even possibly benefit from referral to pain management specialist due to the chronic nature of osseous metastatic disease but will leave that up to Dr. Delton Coombes and his team. They understand the importance of having a single provider prescribing narcotic pain medications and state their compliance.  Nicholos Johns, MMS, PA-C [facility] [place]: [phone]  Fax: [phone]

## 2019-08-27 MED FILL — predniSONE 5 MG TABS: 5 | 30 days supply | Qty: 30 | Fill #0

## 2019-08-27 MED FILL — ABIRATERONE ACETATE 250 MG: 250 | 30 days supply | Qty: 120 | Fill #0

## 2019-08-27 NOTE — Progress Notes (Addendum)
Per request from Freeman Caldron, PA-C this RN phoned Bryson Ha, RN for Dr. Lamonte Richer. I explained to Bryson Ha this patient is in our office in severe pain explaining all he has for pain management is a lidocaine patch.Explained that the patient and his son are telling our staff that the [facility] in [place] is unable to get Fentanyl from their supplier. Went onto explain that the patient reports he has been unable to obtain a refill of his Dilaudid from Dr. Roswell Nickel, RN explains that Keith Casa, NP for Dr. Raliegh Ip escribed 40 tablets of Dilaudid on 6/23 to [place], [place]. Also, Bryson Ha confirms Dr. Raliegh Ip does not mind managing the patient's pain medication to avoid him having multiple prescribers.  Spoke with Joe at [facility], who confirms the Dilaudid script is ready for pick up.  Informed both the patient and his son of this finding. Both verbalized understanding. Son committed to picking up script after this appointment. Stressed that all pain medication refills/request should go through Dr. Raliegh Ip. Both the patient and his son verbalized understanding. Informed Ashlyn Bruning, PA-C of these findings.

## 2019-08-27 NOTE — Telephone Encounter (Signed)
Oral Oncology Patient Advocate Encounter  I spoke with [name], patients son, this afternoon to set up delivery of Abiraterone (Zytiga) and Prednisone.  Address verified for shipment.  Zytiga and prednisone will be filled through [facility] and mailed 08/27/19 for delivery 08/28/19.    [facility] [facility] will call 7-10 days before next refill is due to complete adherence call and set up delivery of medication.     [place] Patient [place] Phone [phone] Fax [phone] 08/27/2019 2:00 PM

## 2019-08-27 NOTE — Telephone Encounter (Signed)
Attempted to call patient on 08/23/19 to schedule delivery of Zytiga and prednisone.  Left voicemail for patient to return my call.  Tried calling son, [name], this afternoon and had to leave a message to return my call.  [place] Patient [place] Phone [phone] Fax [phone] 08/27/2019 1:48 PM

## 2019-08-27 NOTE — Telephone Encounter (Signed)
Oral Chemotherapy Pharmacist Encounter  Spoke with patient and his son today. [facility] will deliver his Zytiga/prednisone on 08/28/19. They know to get started when he receives the medication.  Patient Education I spoke with patient for overview of new oral chemotherapy medication: Zytiga (abiraterone) for the treatment of metastatic castration sensitive prostate cancer in conjunction with prednisone, planned duration until disease progression or unacceptable drug toxicity.   Pt is doing well. Counseled patient on administration, dosing, side effects, monitoring, drug-food interactions, safe handling, storage, and disposal. Patient will take: Zytiga: Take 4 tablets (1,000 mg total) by mouth daily. Take on an empty stomach 1 hour before or 2 hours after a meal Prednisone: Take 1 tablet (5 mg total) by mouth daily with breakfast  Side effects include but not limited to: fatigue, HTN, decreased wbc.    Encouraged patient to monitor blood pressure ate home. Patient and son report that the patient has not and is not taking amlodipine. Medication removed from his medication list.  Reviewed with patient importance of keeping a medication schedule and plan for any missed doses.  Patient and son voiced understanding and appreciation. All questions answered. Medication handout placed in the mail.  Provided patient with [facility] phone number. Patient knows to call the office with questions or concerns. Oral Chemotherapy [facility] will continue to follow.  Darl Pikes, PharmD, BCPS, BCOP, CPP Hematology/Oncology Clinical Pharmacist Practitioner [facility] [phone]  08/27/2019 3:01 PM

## 2019-09-05 DIAGNOSIS — R918 Other nonspecific abnormal finding of lung field: Secondary | ICD-10-CM | POA: Diagnosis not present

## 2019-09-05 DIAGNOSIS — M5440 Lumbago with sciatica, unspecified side: Secondary | ICD-10-CM | POA: Diagnosis not present

## 2019-09-07 ENCOUNTER — Other Ambulatory Visit (HOSPITAL_COMMUNITY): Payer: Self-pay | Admitting: *Deleted

## 2019-09-07 DIAGNOSIS — C61 Malignant neoplasm of prostate: Secondary | ICD-10-CM

## 2019-09-07 DIAGNOSIS — C7951 Secondary malignant neoplasm of bone: Secondary | ICD-10-CM

## 2019-09-07 MED ORDER — HYDROMORPHONE HCL 4 MG PO TABS: 4.0000 mg | ORAL_TABLET | Freq: Four times a day (QID) | ORAL | 0 refills | Status: DC | PRN

## 2019-09-11 DIAGNOSIS — R600 Localized edema: Secondary | ICD-10-CM | POA: Diagnosis not present

## 2019-09-13 ENCOUNTER — Ambulatory Visit (HOSPITAL_COMMUNITY): Payer: Medicare Other | Admitting: Hematology

## 2019-09-13 ENCOUNTER — Inpatient Hospital Stay (HOSPITAL_COMMUNITY): Payer: Medicare Other

## 2019-09-14 ENCOUNTER — Ambulatory Visit: Payer: Medicare Other | Admitting: Urology

## 2019-09-19 ENCOUNTER — Ambulatory Visit: Payer: Medicare Other | Admitting: Urology

## 2019-09-19 ENCOUNTER — Other Ambulatory Visit (HOSPITAL_COMMUNITY): Payer: Self-pay | Admitting: Hematology

## 2019-09-19 DIAGNOSIS — C7951 Secondary malignant neoplasm of bone: Secondary | ICD-10-CM

## 2019-09-19 DIAGNOSIS — C61 Malignant neoplasm of prostate: Secondary | ICD-10-CM

## 2019-09-20 MED FILL — predniSONE 5 MG TABS: 5 | 30 days supply | Qty: 30 | Fill #1

## 2019-09-20 MED FILL — ABIRATERONE ACETATE 250 MG: 250 | 30 days supply | Qty: 120 | Fill #0

## 2019-09-24 ENCOUNTER — Other Ambulatory Visit (HOSPITAL_COMMUNITY): Payer: Self-pay

## 2019-09-24 DIAGNOSIS — C61 Malignant neoplasm of prostate: Secondary | ICD-10-CM

## 2019-09-24 DIAGNOSIS — C7951 Secondary malignant neoplasm of bone: Secondary | ICD-10-CM

## 2019-09-24 MED ORDER — HYDROMORPHONE HCL 4 MG PO TABS: 4.0000 mg | ORAL_TABLET | Freq: Four times a day (QID) | ORAL | 0 refills | Status: DC | PRN

## 2019-09-26 ENCOUNTER — Ambulatory Visit (HOSPITAL_COMMUNITY): Payer: Medicare Other | Admitting: Hematology

## 2019-09-26 ENCOUNTER — Inpatient Hospital Stay (HOSPITAL_COMMUNITY): Payer: Medicare Other

## 2019-09-26 ENCOUNTER — Inpatient Hospital Stay (HOSPITAL_COMMUNITY): Payer: Medicare Other | Admitting: Hematology

## 2019-09-26 ENCOUNTER — Other Ambulatory Visit: Payer: Self-pay

## 2019-09-26 ENCOUNTER — Inpatient Hospital Stay (HOSPITAL_COMMUNITY): Payer: Medicare Other | Attending: Hematology

## 2019-09-26 VITALS — BP 155/78 | HR 69 | Temp 98.6°F | Resp 20 | Wt 149.0 lb

## 2019-09-26 DIAGNOSIS — R262 Difficulty in walking, not elsewhere classified: Secondary | ICD-10-CM | POA: Insufficient documentation

## 2019-09-26 DIAGNOSIS — C7951 Secondary malignant neoplasm of bone: Secondary | ICD-10-CM

## 2019-09-26 DIAGNOSIS — Z7952 Long term (current) use of systemic steroids: Secondary | ICD-10-CM | POA: Diagnosis not present

## 2019-09-26 DIAGNOSIS — M1990 Unspecified osteoarthritis, unspecified site: Secondary | ICD-10-CM | POA: Insufficient documentation

## 2019-09-26 DIAGNOSIS — C61 Malignant neoplasm of prostate: Secondary | ICD-10-CM

## 2019-09-26 DIAGNOSIS — R450 Nervousness: Secondary | ICD-10-CM | POA: Insufficient documentation

## 2019-09-26 DIAGNOSIS — M7989 Other specified soft tissue disorders: Secondary | ICD-10-CM | POA: Insufficient documentation

## 2019-09-26 DIAGNOSIS — Z79899 Other long term (current) drug therapy: Secondary | ICD-10-CM | POA: Diagnosis not present

## 2019-09-26 DIAGNOSIS — Z8249 Family history of ischemic heart disease and other diseases of the circulatory system: Secondary | ICD-10-CM | POA: Diagnosis not present

## 2019-09-26 DIAGNOSIS — Z923 Personal history of irradiation: Secondary | ICD-10-CM | POA: Diagnosis not present

## 2019-09-26 DIAGNOSIS — R519 Headache, unspecified: Secondary | ICD-10-CM | POA: Insufficient documentation

## 2019-09-26 DIAGNOSIS — R5383 Other fatigue: Secondary | ICD-10-CM | POA: Diagnosis not present

## 2019-09-26 DIAGNOSIS — M48061 Spinal stenosis, lumbar region without neurogenic claudication: Secondary | ICD-10-CM | POA: Diagnosis not present

## 2019-09-26 DIAGNOSIS — R232 Flushing: Secondary | ICD-10-CM | POA: Insufficient documentation

## 2019-09-26 DIAGNOSIS — F329 Major depressive disorder, single episode, unspecified: Secondary | ICD-10-CM | POA: Insufficient documentation

## 2019-09-26 DIAGNOSIS — R918 Other nonspecific abnormal finding of lung field: Secondary | ICD-10-CM | POA: Insufficient documentation

## 2019-09-26 DIAGNOSIS — Z8261 Family history of arthritis: Secondary | ICD-10-CM | POA: Insufficient documentation

## 2019-09-26 LAB — CBC WITH DIFFERENTIAL/PLATELET
Abs Immature Granulocytes: 0.03 10*3/uL (ref 0.00–0.07)
Basophils Absolute: 0.1 10*3/uL (ref 0.0–0.1)
Basophils Relative: 1 %
Eosinophils Absolute: 0.4 10*3/uL (ref 0.0–0.5)
Eosinophils Relative: 7 %
HCT: 40.4 % (ref 39.0–52.0)
Hemoglobin: 13.4 g/dL (ref 13.0–17.0)
Immature Granulocytes: 1 %
Lymphocytes Relative: 23 %
Lymphs Abs: 1.2 10*3/uL (ref 0.7–4.0)
MCH: 31.8 pg (ref 26.0–34.0)
MCHC: 33.2 g/dL (ref 30.0–36.0)
MCV: 95.7 fL (ref 80.0–100.0)
Monocytes Absolute: 0.8 10*3/uL (ref 0.1–1.0)
Monocytes Relative: 14 %
Neutro Abs: 3.1 10*3/uL (ref 1.7–7.7)
Neutrophils Relative %: 54 %
Platelets: 265 10*3/uL (ref 150–400)
RBC: 4.22 MIL/uL (ref 4.22–5.81)
RDW: 14.2 % (ref 11.5–15.5)
WBC: 5.5 10*3/uL (ref 4.0–10.5)
nRBC: 0 % (ref 0.0–0.2)

## 2019-09-26 LAB — COMPREHENSIVE METABOLIC PANEL
ALT: 27 U/L (ref 0–44)
AST: 25 U/L (ref 15–41)
Albumin: 3.8 g/dL (ref 3.5–5.0)
Alkaline Phosphatase: 238 U/L — ABNORMAL HIGH (ref 38–126)
Anion gap: 9 (ref 5–15)
BUN: 11 mg/dL (ref 8–23)
CO2: 24 mmol/L (ref 22–32)
Calcium: 8.7 mg/dL — ABNORMAL LOW (ref 8.9–10.3)
Chloride: 104 mmol/L (ref 98–111)
Creatinine, Ser: 0.54 mg/dL — ABNORMAL LOW (ref 0.61–1.24)
GFR calc Af Amer: >60 mL/min (ref >60)
GFR calc non Af Amer: >60 mL/min (ref >60)
Glucose, Bld: 101 mg/dL — ABNORMAL HIGH (ref 70–99)
Potassium: 3.8 mmol/L (ref 3.5–5.1)
Sodium: 137 mmol/L (ref 135–145)
Total Bilirubin: 0.3 mg/dL (ref 0.3–1.2)
Total Protein: 6.5 g/dL (ref 6.5–8.1)

## 2019-09-26 MED ORDER — FENTANYL 25 MCG/HR TD PT72: 1.0000 | MEDICATED_PATCH | TRANSDERMAL | 0 refills | Status: DC

## 2019-09-26 NOTE — Progress Notes (Signed)
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  PRIOR THERAPY: Radiation therapy and seed implants  NGS Results: Pending  CURRENT THERAPY: Lupron  BRIEF ONCOLOGIC HISTORY:  Oncology History   No history exists.    CANCER STAGING: Cancer Staging No matching staging information was found for the patient.  INTERVAL HISTORY:  [name], a 79 y.o. male, returns for routine follow-up of his castration sensitive prostate cancer. [name] was last seen on 08/14/2019.   He started taking his Zytiga 3-4 weeks ago. He is due for a refill soon. He has not noticed an improvement in the pain.   He states that he has been taking his Dilaudid once every 6 hours because that helps him with the pain. He states that he has trouble walking and even standing due to the pain. He is unable to walk without a cane because he is afraid he will fall. He missed his last appointment due to the pain.   REVIEW OF SYSTEMS:  Review of Systems  Constitutional: Positive for appetite change and fatigue.  HENT:  Negative.   Eyes: Negative.   Respiratory: Negative.   Cardiovascular: Positive for leg swelling.  Gastrointestinal: Negative.   Endocrine: Negative.   Genitourinary: Negative.    Musculoskeletal: Negative.   Skin: Negative.   Neurological: Positive for headaches.  Hematological: Negative.   Psychiatric/Behavioral: Positive for depression. The patient is nervous/anxious.   All other systems reviewed and are negative.   PAST MEDICAL/SURGICAL HISTORY:  Past Medical History:  Diagnosis Date  . Agent orange exposure   . Arthritis   . Coronary artery disease    BMS to mid LAD 2001, DES to proximal LAD 2017  . Enlarged prostate    XRT 2016  . Headache     Sinus headaches   . Heart abnormality   . History of depression   . Hyperlipidemia   . Hypothyroidism   . Prostate cancer ([facility]) 07/2014   hormonal therapy, external beam radiation therapy  . PTSD (post-traumatic stress disorder)    Past Surgical History:  Procedure Laterality Date  . BRONCHIAL BRUSHINGS  07/02/2019   Procedure: BRONCHIAL BRUSHINGS;  Surgeon: Rigoberto Noel, MD;  Location: [facility];  Service: Cardiopulmonary;;  . BRONCHIAL NEEDLE ASPIRATION BIOPSY  07/02/2019   Procedure: BRONCHIAL NEEDLE ASPIRATION BIOPSIES;  Surgeon: Rigoberto Noel, MD;  Location: [facility];  Service: Cardiopulmonary;;  . BRONCHIAL WASHINGS  07/02/2019   Procedure: BRONCHIAL WASHINGS;  Surgeon: Rigoberto Noel, MD;  Location: [facility];  Service: Cardiopulmonary;;  . CARDIAC CATHETERIZATION N/A 12/12/2015   Procedure: Left Heart Cath and Coronary Angiography;  Surgeon: Peter M Martinique, MD;  Location: [place] [facility];  Service: Cardiovascular;  Laterality: N/A;  . CARDIAC CATHETERIZATION N/A 12/12/2015   Procedure: Coronary Stent Intervention;  Surgeon: Peter M Martinique, MD;  Location: [place] [facility];  Service: Cardiovascular;  Laterality: N/A;  . CORONARY STENT INTERVENTION N/A 09/06/2017   Procedure: CORONARY STENT INTERVENTION;  Surgeon: Burnell Blanks, MD;  Location: [place] [facility];  Service: Cardiovascular;  Laterality: N/A;  . ENDOBRONCHIAL ULTRASOUND N/A 07/02/2019   Procedure: ENDOBRONCHIAL ULTRASOUND;  Surgeon: Rigoberto Noel, MD;  [facility]: [facility];  Service: Cardiopulmonary;  Laterality: N/A;  . FLEXIBLE BRONCHOSCOPY  07/02/2019  Procedure: FLEXIBLE BRONCHOSCOPY;  Surgeon: Rigoberto Noel, MD;  Location: [facility];  Service: Cardiopulmonary;;  . HERNIA REPAIR Right   . LEFT HEART CATH AND CORONARY ANGIOGRAPHY N/A 09/02/2016   Procedure: Left Heart Cath and Coronary Angiography;  Surgeon: Leonie Man, MD;  Location: [place] [facility];  Service: Cardiovascular;   Laterality: N/A;  . LEFT HEART CATH AND CORONARY ANGIOGRAPHY N/A 09/06/2017   Procedure: LEFT HEART CATH AND CORONARY ANGIOGRAPHY;  Surgeon: Burnell Blanks, MD;  Location: [place] [facility];  Service: Cardiovascular;  Laterality: N/A;  . LEFT HEART CATH AND CORONARY ANGIOGRAPHY N/A 05/04/2019   Procedure: LEFT HEART CATH AND CORONARY ANGIOGRAPHY;  Surgeon: Martinique, Peter M, MD;  Location: [place] [facility];  Service: Cardiovascular;  Laterality: N/A;  . PROSTATE BIOPSY N/A 08/28/2014   Procedure: BIOPSY TRANSRECTAL ULTRASONIC PROSTATE (TUBP);  Surgeon: Rana Snare, MD;  [facility];  Service: Urology;  Laterality: N/A;  . TRANSURETHRAL RESECTION OF PROSTATE N/A 08/28/2014   Procedure: TRANSURETHRAL RESECTION OF THE PROSTATE WITH GYRUS INSTRUMENTS;  Surgeon: Rana Snare, MD;  Location: [facility];  Service: Urology;  Laterality: N/A;    SOCIAL HISTORY:  Social History   Socioeconomic History  . Marital status: Married    Spouse name: Not on file  . Number of children: 5  . Years of education: Not on file  . Highest education level: Not on file  Occupational History  . Occupation: retired  Tobacco Use  . Smoking status: Never Smoker  . Smokeless tobacco: Former Systems developer    Types: Secondary school teacher  . Vaping Use: Never used  Substance and Sexual Activity  . Alcohol use: No  . Drug use: No  . Sexual activity: Yes  Other Topics Concern  . Not on file  Social History Narrative   Married   5 children   Social Determinants of Health   Financial Resource Strain: Low Risk   . Difficulty of Paying Living Expenses: Not hard at all  Food Insecurity: No Food Insecurity  . Worried About Charity fundraiser in the Last Year: Never true  . Ran Out of Food in the Last Year: Never true  Transportation Needs: No Transportation Needs  . Lack of Transportation (Medical): No  . Lack of Transportation (Non-Medical): No  Physical Activity: Inactive  . Days of Exercise per Week: 0 days  .  Minutes of Exercise per Session: 0 min  Stress: No Stress Concern Present  . Feeling of Stress : Not at all  Social Connections: Moderately Isolated  . Frequency of Communication with Friends and Family: More than three times a week  . Frequency of Social Gatherings with Friends and Family: More than three times a week  . Attends Religious Services: Never  . Active Member of Clubs or Organizations: No  . Attends Archivist Meetings: Never  . Marital Status: Married  Human resources officer Violence: Not At Risk  . Fear of Current or Ex-Partner: No  . Emotionally Abused: No  . Physically Abused: No  . Sexually Abused: No    FAMILY HISTORY:  Family History  Problem Relation Age of Onset  . Arthritis Mother   . Heart attack Father     CURRENT MEDICATIONS:  Current Outpatient Medications  Medication Sig Dispense Refill  . abiraterone acetate (ZYTIGA) 250 MG tablet TAKE 4 TABLETS (1,000 MG TOTAL) BY MOUTH DAILY. TAKE ON AN EMPTY STOMACH 1 HOUR BEFORE OR 2 HOURS AFTER A MEAL 120  tablet 0  . ALPRAZolam (XANAX) 0.5 MG tablet Take 1 tablet (0.5 mg total) by mouth 2 (two) times daily as needed for anxiety. 10 tablet 0  . aspirin EC 81 MG tablet Take 1 tablet (81 mg total) by mouth daily.    . bicalutamide (CASODEX) 50 MG tablet Take 1 tablet (50 mg total) by mouth daily. 30 tablet 0  . dexamethasone (DECADRON) 4 MG tablet Take 1 tablet (4 mg total) by mouth 2 (two) times daily with a meal. 60 tablet 1  . diclofenac sodium (VOLTAREN) 1 % GEL APPLY 2 GRAMS TOPICALLY 4 TIMES DAILY 100 g 5  . DULoxetine (CYMBALTA) 60 MG capsule Take 60 mg by mouth daily.    . folic acid (FOLVITE) 1 MG tablet Take 1 tablet (1 mg total) by mouth daily.    Marland Kitchen gabapentin (NEURONTIN) 400 MG capsule Take 1 capsule (400 mg total) by mouth 3 (three) times daily. 90 capsule 3  . HYDROmorphone (DILAUDID) 4 MG tablet Take 1 tablet (4 mg total) by mouth every 6 (six) hours as needed for severe pain. 40 tablet 0  .  pantoprazole (PROTONIX) 40 MG tablet Take 1 tablet (40 mg total) by mouth daily. 30 tablet 3  . predniSONE (DELTASONE) 5 MG tablet Take 1 tablet (5 mg total) by mouth daily with breakfast. 30 tablet 6  . tamsulosin (FLOMAX) 0.4 MG CAPS capsule Take 0.4 mg by mouth daily.    Marland Kitchen thiamine 100 MG tablet Take 1 tablet (100 mg total) by mouth daily.    . fentaNYL (DURAGESIC) 25 MCG/HR Place 1 patch onto the skin every 3 (three) days. (Patient not taking: Reported on 09/26/2019) 5 patch 0  . furosemide (LASIX) 20 MG tablet Take 20 mg by mouth 2 (two) times daily. (Patient not taking: Reported on 09/26/2019)    . meclizine (ANTIVERT) 25 MG tablet Take 1 tablet (25 mg total) by mouth 2 (two) times daily as needed for dizziness. (Patient not taking: Reported on 09/26/2019) 30 tablet 0  . NARCAN 4 MG/0.1ML LIQD nasal spray kit CALL 911. ADMINISTER A SINGLE SPRAY OF NARCAN IN ONE NOSTRIL. REPEAT EVERY 3 MINUTES AS NEEDED IF NO OR MINIMAL RESPONSE. (Patient not taking: Reported on 09/26/2019)    . nitroGLYCERIN (NITROSTAT) 0.4 MG SL tablet Place 1 tablet (0.4 mg total) under the tongue every 5 (five) minutes as needed for chest pain. X 3 doses (Patient not taking: Reported on 09/26/2019) 25 tablet 12  . polyethylene glycol powder (GLYCOLAX/MIRALAX) 17 GM/SCOOP powder Take 17 g by mouth daily as needed for mild constipation or moderate constipation.  (Patient not taking: Reported on 09/26/2019)     No current facility-administered medications for this visit.    ALLERGIES:  Allergies  Allergen Reactions  . Lipitor [Atorvastatin]     Muscles aches    PHYSICAL EXAM:  Performance status (ECOG): 1 - Symptomatic but completely ambulatory  Vitals:   09/26/19 1400  BP: (!) 155/78  Pulse: 69  Resp: 20  Temp: 98.6 F (37 C)  SpO2: 96%   Wt Readings from Last 3 Encounters:  09/26/19 149 lb (67.6 kg)  08/24/19 143 lb (64.9 kg)  08/14/19 144 lb (65.3 kg)   Physical Exam Vitals reviewed.  Constitutional:       Appearance: Normal appearance.  HENT:     Mouth/Throat:     Mouth: Mucous membranes are moist.  Eyes:     Pupils: Pupils are equal, round, and reactive to light.  Cardiovascular:  Rate and Rhythm: Normal rate and regular rhythm.     Pulses: Normal pulses.     Heart sounds: Normal heart sounds.  Pulmonary:     Effort: Pulmonary effort is normal.     Breath sounds: Normal breath sounds.  Abdominal:     Palpations: Abdomen is soft. There is no mass.     Tenderness: There is no abdominal tenderness.  Musculoskeletal:     Right lower leg: 1+ Edema present.     Left lower leg: 1+ Edema present.  Neurological:     Mental Status: He is alert and oriented to person, place, and time.  Psychiatric:        Mood and Affect: Mood normal.        Behavior: Behavior normal.      LABORATORY DATA:  I have reviewed the labs as listed.  CBC Latest Ref Rng & Units 09/26/2019 08/14/2019 07/25/2019  WBC 4.0 - 10.5 K/uL 5.5 9.4 7.2  Hemoglobin 13.0 - 17.0 g/dL 13.4 13.7 11.7(L)  Hematocrit 39 - 52 % 40.4 42.2 36.0(L)  Platelets 150 - 400 K/uL 265 181 456(H)   CMP Latest Ref Rng & Units 08/14/2019 07/25/2019 07/24/2019  Glucose 70 - 99 mg/dL 117(H) 106(H) 117(H)  BUN 8 - 23 mg/dL _0 Creatinine 0.61 - 1.24 mg/dL 0.55(L) 0.54(L) 0.44(L)  Sodium 135 - 145 mmol/L 133(L) 132(L) 132(L)  Potassium 3.5 - 5.1 mmol/L 4.7 4.1 3.9  Chloride 98 - 111 mmol/L 94(L) 97(L) 98  CO2 22 - 32 mmol/L _1 Calcium 8.9 - 10.3 mg/dL 8.7(L) 8.0(L) 7.8(L)  Total Protein 6.5 - 8.1 g/dL 6.6 - -  Total Bilirubin 0.3 - 1.2 mg/dL 0.4 - -  Alkaline Phos 38 - 126 U/L 191(H) - -  AST 15 - 41 U/L 19 - -  ALT 0 - 44 U/L 27 - -    DIAGNOSTIC IMAGING:  I have independently reviewed the scans and discussed with the patient. No results found.    ASSESSMENT:  1.  Metastatic castration sensitive prostate cancer: -History of prostate cancer diagnosed 08/28/2014, Gleason 4+4= 8, status post XRT and seed  implants. -Presentation with low back pain of several months, PSA on 06/19/2019 was 239, testosterone 210, alkaline phosphatase 306. -MRI of the cervical, thoracic and lumbar spine on 07/27/2019 shows diffuse bone metastatic disease.  Mild to moderate compression deformity of T3 with mild ventral epidural extension.  No cord compression.  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 -CT angio of the chest, abdomen and pelvis on 06/27/2019 shows multiple lung nodules bilaterally, largest in the left upper lobe measuring 2.5 x 1.6 x 1.5 cm with associated left hilar and AP window lymphadenopathy. -Navigational bronchoscopy and biopsy on 07/02/2019 with upper lobe brushing and washing shows atypical cells.  10 L FNA also shows atypical cells. -1 pack/day for 4-5 years, quit 60 years ago.  Positive agent orange exposure -Lupron 45 mg on 07/12/2019 along with 1 month of Casodex. -PET scan on 08/11/2019 shows mildly hypermetabolic widespread sclerotic osseous metastasis throughout the axial and proximal appendicular skeleton, increased sclerosis since 06/27/2018 on CT angiogram.  This could represent response to therapy.  No hypermetabolic lymphadenopathy.  Left mediastinal and left hilar adenopathy is seen on 06/19/2019 has resolved.  Bilateral pulmonary nodules are stable to decreased in size.  Dominant left upper lobe lung nodule has decreased from 2.5 cm to 1.7  cm.  2.  Low back pain: -10 treatments of radiation therapy completed on 07/11/2019.   PLAN:  1.  Metastatic castration sensitive prostate cancer: -Abiraterone started around 08/29/2019.  He is taking prednisone 5 mg with it.  Patient canceled his appointment on 09/13/2019. -He reported some hot flashes.  I have reviewed his labs today which showed normal LFTs.  Alk phos was 238, up from 191. -Last PSA on 08/14/2019 was 11.96. -He will continue Abiraterone 4 tablets  daily.  I plan to see him back in 2 weeks for follow-up.  We will check PSA again in 2 months from the start of Abiraterone.  2.  Low back pain: -He is currently taking Dilaudid 4 mg every 6 hours.  He reportedly has some pain when he stands up which limits him to walk.  He reports that each tablet of Dilaudid is helping him for 6 hours. -We gave him fentanyl patch in June.  Apparently his pharmacy did not have the patches on stock.  Patient did not call us about it.  Neither the pharmacy called [place].  He also missed his 09/13/2019 appointment. -Today I have sent fentanyl patch prescription to [facility] [place]. -I have also had a long conversation about pain management.  He is currently living in [place] with his son.  I think it is very appropriate for him to have palliative care see him for better pain control.  3.  Right leg swelling: -Doppler done on 07/12/2019 was negative.   Orders placed this encounter:  No orders of the defined types were placed in this encounter.    Derek Jack, MD [facility] [phone]   I, Jacqualyn Posey, am acting as a scribe for Dr. Sanda Linger.  I, Derek Jack MD, have reviewed the above documentation for accuracy and completeness, and I agree with the above.

## 2019-09-26 NOTE — Patient Instructions (Addendum)
[facility] at [facility] Discharge Instructions  You were seen today by Dr. Katragadda. He went over your recent results. Dr. Katragadda will see you back in for labs and follow up.   Thank you for choosing [facility] at [facility] to provide your oncology and hematology care.  To afford each patient quality time with our provider, please arrive at least 15 minutes before your scheduled appointment time.   If you have a lab appointment with the Cancer Center please come in thru the Main Entrance and check in at the main information desk  You need to re-schedule your appointment should you arrive 10 or more minutes late.  We strive to give you quality time with our providers, and arriving late affects you and other patients whose appointments are after yours.  Also, if you no show three or more times for appointments you may be dismissed from the clinic at the providers discretion.     Again, thank you for choosing [facility].  Our hope is that these requests will decrease the amount of time that you wait before being seen by our physicians.       _____________________________________________________________  Should you have questions after your visit to [facility], please contact our office at [phone] between the hours of 8:00 a.m. and 4:30 p.m.  Voicemails left after 4:00 p.m. will not be returned until the following business day.  For prescription refill requests, have your pharmacy contact our office and allow 72 hours.    Cancer Center Support Programs:   > Cancer Support Group  2nd Tuesday of the month 1pm-2pm, Journey Room    

## 2019-09-26 NOTE — Addendum Note (Signed)
Addended by: Farley Ly on: 09/26/2019 04:35 PM   Modules accepted: Orders

## 2019-10-06 DIAGNOSIS — R918 Other nonspecific abnormal finding of lung field: Secondary | ICD-10-CM | POA: Diagnosis not present

## 2019-10-06 DIAGNOSIS — M5440 Lumbago with sciatica, unspecified side: Secondary | ICD-10-CM | POA: Diagnosis not present

## 2019-10-12 ENCOUNTER — Inpatient Hospital Stay (HOSPITAL_COMMUNITY): Payer: Medicare Other | Attending: Hematology

## 2019-10-12 ENCOUNTER — Other Ambulatory Visit (HOSPITAL_COMMUNITY): Payer: Self-pay

## 2019-10-12 ENCOUNTER — Inpatient Hospital Stay (HOSPITAL_BASED_OUTPATIENT_CLINIC_OR_DEPARTMENT_OTHER): Payer: Medicare Other | Admitting: Hematology

## 2019-10-12 ENCOUNTER — Other Ambulatory Visit: Payer: Self-pay

## 2019-10-12 ENCOUNTER — Encounter (HOSPITAL_COMMUNITY): Payer: Self-pay

## 2019-10-12 ENCOUNTER — Telehealth (HOSPITAL_COMMUNITY): Payer: Self-pay | Admitting: *Deleted

## 2019-10-12 VITALS — BP 143/72 | HR 65 | Temp 97.1°F | Resp 18 | Wt 152.6 lb

## 2019-10-12 DIAGNOSIS — Z79899 Other long term (current) drug therapy: Secondary | ICD-10-CM | POA: Diagnosis not present

## 2019-10-12 DIAGNOSIS — M549 Dorsalgia, unspecified: Secondary | ICD-10-CM | POA: Insufficient documentation

## 2019-10-12 DIAGNOSIS — C7951 Secondary malignant neoplasm of bone: Secondary | ICD-10-CM

## 2019-10-12 DIAGNOSIS — R109 Unspecified abdominal pain: Secondary | ICD-10-CM | POA: Diagnosis not present

## 2019-10-12 DIAGNOSIS — N400 Enlarged prostate without lower urinary tract symptoms: Secondary | ICD-10-CM | POA: Insufficient documentation

## 2019-10-12 DIAGNOSIS — I2510 Atherosclerotic heart disease of native coronary artery without angina pectoris: Secondary | ICD-10-CM | POA: Insufficient documentation

## 2019-10-12 DIAGNOSIS — Z923 Personal history of irradiation: Secondary | ICD-10-CM | POA: Diagnosis not present

## 2019-10-12 DIAGNOSIS — F329 Major depressive disorder, single episode, unspecified: Secondary | ICD-10-CM | POA: Diagnosis not present

## 2019-10-12 DIAGNOSIS — M7989 Other specified soft tissue disorders: Secondary | ICD-10-CM | POA: Insufficient documentation

## 2019-10-12 DIAGNOSIS — C61 Malignant neoplasm of prostate: Secondary | ICD-10-CM

## 2019-10-12 DIAGNOSIS — Z191 Hormone sensitive malignancy status: Secondary | ICD-10-CM | POA: Insufficient documentation

## 2019-10-12 DIAGNOSIS — E785 Hyperlipidemia, unspecified: Secondary | ICD-10-CM | POA: Diagnosis not present

## 2019-10-12 DIAGNOSIS — F4310 Post-traumatic stress disorder, unspecified: Secondary | ICD-10-CM | POA: Insufficient documentation

## 2019-10-12 DIAGNOSIS — Z7982 Long term (current) use of aspirin: Secondary | ICD-10-CM | POA: Insufficient documentation

## 2019-10-12 DIAGNOSIS — E039 Hypothyroidism, unspecified: Secondary | ICD-10-CM | POA: Diagnosis not present

## 2019-10-12 LAB — COMPREHENSIVE METABOLIC PANEL
ALT: 19 U/L (ref 0–44)
AST: 26 U/L (ref 15–41)
Albumin: 3.5 g/dL (ref 3.5–5.0)
Alkaline Phosphatase: 164 U/L — ABNORMAL HIGH (ref 38–126)
Anion gap: 9 (ref 5–15)
BUN: 13 mg/dL (ref 8–23)
CO2: 24 mmol/L (ref 22–32)
Calcium: 8.8 mg/dL — ABNORMAL LOW (ref 8.9–10.3)
Chloride: 105 mmol/L (ref 98–111)
Creatinine, Ser: 0.57 mg/dL — ABNORMAL LOW (ref 0.61–1.24)
GFR calc Af Amer: >60 mL/min (ref >60)
GFR calc non Af Amer: >60 mL/min (ref >60)
Glucose, Bld: 104 mg/dL — ABNORMAL HIGH (ref 70–99)
Potassium: 3.3 mmol/L — ABNORMAL LOW (ref 3.5–5.1)
Sodium: 138 mmol/L (ref 135–145)
Total Bilirubin: 0.5 mg/dL (ref 0.3–1.2)
Total Protein: 6.3 g/dL — ABNORMAL LOW (ref 6.5–8.1)

## 2019-10-12 LAB — CBC WITH DIFFERENTIAL/PLATELET
Abs Immature Granulocytes: 0.03 10*3/uL (ref 0.00–0.07)
Basophils Absolute: 0.1 10*3/uL (ref 0.0–0.1)
Basophils Relative: 1 %
Eosinophils Absolute: 0.5 10*3/uL (ref 0.0–0.5)
Eosinophils Relative: 10 %
HCT: 40.2 % (ref 39.0–52.0)
Hemoglobin: 12.9 g/dL — ABNORMAL LOW (ref 13.0–17.0)
Immature Granulocytes: 1 %
Lymphocytes Relative: 23 %
Lymphs Abs: 1.1 10*3/uL (ref 0.7–4.0)
MCH: 30.6 pg (ref 26.0–34.0)
MCHC: 32.1 g/dL (ref 30.0–36.0)
MCV: 95.3 fL (ref 80.0–100.0)
Monocytes Absolute: 0.7 10*3/uL (ref 0.1–1.0)
Monocytes Relative: 15 %
Neutro Abs: 2.6 10*3/uL (ref 1.7–7.7)
Neutrophils Relative %: 50 %
Platelets: 308 10*3/uL (ref 150–400)
RBC: 4.22 MIL/uL (ref 4.22–5.81)
RDW: 13.3 % (ref 11.5–15.5)
WBC: 5 10*3/uL (ref 4.0–10.5)
nRBC: 0 % (ref 0.0–0.2)

## 2019-10-12 LAB — PSA: Prostatic Specific Antigen: 0.07 ng/mL (ref 0.00–4.00)

## 2019-10-12 LAB — LACTATE DEHYDROGENASE: LDH: 203 U/L — ABNORMAL HIGH (ref 98–192)

## 2019-10-12 MED ORDER — PREDNISONE 5 MG PO TABS: 5.0000 mg | ORAL_TABLET | Freq: Every day | ORAL | 6 refills | Status: DC

## 2019-10-12 MED ORDER — FENTANYL 25 MCG/HR TD PT72: 1.0000 | MEDICATED_PATCH | TRANSDERMAL | 0 refills | Status: DC

## 2019-10-12 MED ORDER — HYDROMORPHONE HCL 4 MG PO TABS: 4.0000 mg | ORAL_TABLET | Freq: Four times a day (QID) | ORAL | 0 refills | Status: DC | PRN

## 2019-10-12 NOTE — Patient Instructions (Addendum)
[place] at [facility] Discharge Instructions  You were seen and examined today by Dr. Delton Coombes. Your lab work looks good today, your potassium is a bit low. Please be sure you are taking you PREDNISONE with your chemo pills. Take all four chemo pills at night before you go to bed to prevent any nausea.  We will see you again in 2 weeks.   Thank you for choosing [place] at [facility] to provide your oncology and hematology care.  To afford each patient quality time with our provider, please arrive at least 15 minutes before your scheduled appointment time.   If you have a lab appointment with the [facility] please come in thru the Main Entrance and check in at the main information desk.  You need to re-schedule your appointment should you arrive 10 or more minutes late.  We strive to give you quality time with our providers, and arriving late affects you and other patients whose appointments are after yours.  Also, if you no show three or more times for appointments you may be dismissed from the clinic at the providers discretion.     Again, thank you for choosing [facility].  Our hope is that these requests will decrease the amount of time that you wait before being seen by our physicians.       _____________________________________________________________  Should you have questions after your visit to [facility], please contact our office at [phone] and follow the prompts.  Our office hours are 8:00 a.m. and 4:30 p.m. Monday - Friday.  Please note that voicemails left after 4:00 p.m. may not be returned until the following business day.  We are closed weekends and major holidays.  You do have access to a nurse 24-7, just call the main number to the clinic [phone] and do not press any options, hold on the line and a nurse will answer the phone.    For prescription refill requests, have your pharmacy  contact our office and allow 72 hours.    Due to Covid, you will need to wear a mask upon entering the hospital. If you do not have a mask, a mask will be given to you at the Main Entrance upon arrival. For doctor visits, patients may have 1 support person age 12 or older with them. For treatment visits, patients can not have anyone with them due to social distancing guidelines and our immunocompromised population.

## 2019-10-12 NOTE — Progress Notes (Signed)
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    CANCER STAGING: Cancer Staging No matching staging information was found for the patient.  INTERVAL HISTORY:  [name], a 79 y.o. male, returns for routine follow-up of his metastatic castration sensitive prostate cancer. [name] was last seen on 09/26/2019.   Today he reports that his back and abdominal pain is stable at around 5/10; he takes Dilaudid every 6 hours and uses the fentanyl patches and is able to do all of his chores. He denies having any other pains. His appetite is not good and he eats small meals throughout the day, along with Ensure. He takes Zytiga 2 tablets in the morning and 2 tablets in the evening, 2 hours before or after a meal.  The palliative nurse has not come to his house yet.  REVIEW OF SYSTEMS:  Review of Systems  Constitutional: Positive for appetite change (severely decreased) and fatigue (severe).  Cardiovascular: Positive for leg swelling.  Gastrointestinal: Positive for abdominal pain.  Musculoskeletal: Positive for back pain (5/10 back to abdominal pain).  Psychiatric/Behavioral: Positive for depression.  All other systems reviewed and are negative.   PAST MEDICAL/SURGICAL HISTORY:  Past Medical History:  Diagnosis Date  . Agent orange exposure   . Arthritis   . Coronary artery disease    BMS to mid LAD 2001, DES to proximal LAD 2017  . Enlarged prostate    XRT 2016  . Headache    Sinus headaches   . Heart abnormality   . History of depression   . Hyperlipidemia   . Hypothyroidism    . Prostate cancer ([place]) 07/2014   hormonal therapy, external beam radiation therapy  . PTSD (post-traumatic stress disorder)    Past Surgical History:  Procedure Laterality Date  . BRONCHIAL BRUSHINGS  07/02/2019   Procedure: BRONCHIAL BRUSHINGS;  Surgeon: Keith Noel, MD;  Location: [facility];  Service: Cardiopulmonary;;  . BRONCHIAL NEEDLE ASPIRATION BIOPSY  07/02/2019   Procedure: BRONCHIAL NEEDLE ASPIRATION BIOPSIES;  Surgeon: Keith Noel, MD;  Location: [facility];  Service: Cardiopulmonary;;  . BRONCHIAL WASHINGS  07/02/2019   Procedure: BRONCHIAL WASHINGS;  Surgeon: Keith Noel, MD;  Location: [facility];  Service: Cardiopulmonary;;  . CARDIAC CATHETERIZATION N/A 12/12/2015   Procedure: Left Heart Cath and Coronary Angiography;  Surgeon: Keith Olson Martinique, MD;  Location: [facility];  Service: Cardiovascular;  Laterality: N/A;  . CARDIAC CATHETERIZATION N/A 12/12/2015   Procedure: Coronary Stent Intervention;  Surgeon: Keith Olson Martinique, MD;  Location: [facility];  Service: Cardiovascular;  Laterality: N/A;  . CORONARY STENT INTERVENTION N/A 09/06/2017   Procedure: CORONARY STENT INTERVENTION;  Surgeon: Keith Blanks, MD;  Location: [facility];  Service: Cardiovascular;  Laterality: N/A;  . ENDOBRONCHIAL ULTRASOUND N/A 07/02/2019   Procedure: ENDOBRONCHIAL ULTRASOUND;  Surgeon: Keith Noel, MD;  Location: [facility];  Service: Cardiopulmonary;  Laterality: N/A;  . FLEXIBLE BRONCHOSCOPY  07/02/2019   Procedure: FLEXIBLE BRONCHOSCOPY;  Surgeon: Keith Noel, MD;  Location: [facility];  Service: Cardiopulmonary;;  . HERNIA REPAIR Right   .  LEFT HEART CATH AND CORONARY ANGIOGRAPHY N/A 09/02/2016   Procedure: Left Heart Cath and Coronary Angiography;  Surgeon: Keith Man, MD;  Location: [facility];  Service: Cardiovascular;  Laterality: N/A;  . LEFT HEART CATH AND CORONARY ANGIOGRAPHY N/A 09/06/2017   Procedure: LEFT HEART CATH AND  CORONARY ANGIOGRAPHY;  Surgeon: Keith Blanks, MD;  Location: [facility];  Service: Cardiovascular;  Laterality: N/A;  . LEFT HEART CATH AND CORONARY ANGIOGRAPHY N/A 05/04/2019   Procedure: LEFT HEART CATH AND CORONARY ANGIOGRAPHY;  Surgeon: Olson, Keith M, MD;  Location: [facility];  Service: Cardiovascular;  Laterality: N/A;  . PROSTATE BIOPSY N/A 08/28/2014   Procedure: BIOPSY TRANSRECTAL ULTRASONIC PROSTATE (TUBP);  Surgeon: Keith Snare, MD;  Location: [facility];  Service: Urology;  Laterality: N/A;  . TRANSURETHRAL RESECTION OF PROSTATE N/A 08/28/2014   Procedure: TRANSURETHRAL RESECTION OF THE PROSTATE WITH GYRUS INSTRUMENTS;  Surgeon: Keith Snare, MD;  Location: [facility];  Service: Urology;  Laterality: N/A;    SOCIAL HISTORY:  Social History   Socioeconomic History  . Marital status: Married    Spouse name: Not on file  . Number of children: 5  . Years of education: Not on file  . Highest education level: Not on file  Occupational History  . Occupation: retired  Tobacco Use  . Smoking status: Never Smoker  . Smokeless tobacco: Former Systems developer    Types: Secondary school teacher  . Vaping Use: Never used  Substance and Sexual Activity  . Alcohol use: No  . Drug use: No  . Sexual activity: Yes  Other Topics Concern  . Not on file  Social History Narrative   Married   5 children   Social Determinants of Health   Financial Resource Strain: Low Risk   . Difficulty of Paying Living Expenses: Not hard at all  Food Insecurity: No Food Insecurity  . Worried About Charity fundraiser in the Last Year: Never true  . Ran Out of Food in the Last Year: Never true  Transportation Needs: No Transportation Needs  . Lack of Transportation (Medical): No  . Lack of Transportation (Non-Medical): No  Physical Activity: Inactive  . Days of Exercise per Week: 0 days  . Minutes of Exercise per Session: 0 min  Stress: No Stress Concern Present  . Feeling of Stress : Not at all    Social Connections: Moderately Isolated  . Frequency of Communication with Friends and Family: More than three times a week  . Frequency of Social Gatherings with Friends and Family: More than three times a week  . Attends Religious Services: Never  . Active Member of Clubs or Organizations: No  . Attends Archivist Meetings: Never  . Marital Status: Married  Human resources officer Violence: Not At Risk  . Fear of Current or Ex-Partner: No  . Emotionally Abused: No  . Physically Abused: No  . Sexually Abused: No    FAMILY HISTORY:  Family History  Problem Relation Age of Onset  . Arthritis Mother   . Heart attack Father     CURRENT MEDICATIONS:  Current Outpatient Medications  Medication Sig Dispense Refill  . abiraterone acetate (ZYTIGA) 250 MG tablet TAKE 4 TABLETS (1,000 MG TOTAL) BY MOUTH DAILY. TAKE ON AN EMPTY STOMACH 1 HOUR BEFORE OR 2 HOURS AFTER A MEAL 120 tablet 0  . ALPRAZolam (XANAX) 0.5 MG tablet Take 1 tablet (0.5 mg total) by mouth 2 (two) times daily as needed  for anxiety. 10 tablet 0  . aspirin EC 81 MG tablet Take 1 tablet (81 mg total) by mouth daily.    . bicalutamide (CASODEX) 50 MG tablet Take 1 tablet (50 mg total) by mouth daily. 30 tablet 0  . dexamethasone (DECADRON) 4 MG tablet Take 1 tablet (4 mg total) by mouth 2 (two) times daily with a meal. 60 tablet 1  . diclofenac sodium (VOLTAREN) 1 % GEL APPLY 2 GRAMS TOPICALLY 4 TIMES DAILY 100 g 5  . DULoxetine (CYMBALTA) 60 MG capsule Take 60 mg by mouth daily.    . fentaNYL (DURAGESIC) 25 MCG/HR Place 1 patch onto the skin every 3 (three) days. 5 patch 0  . folic acid (FOLVITE) 1 MG tablet Take 1 tablet (1 mg total) by mouth daily.    . furosemide (LASIX) 20 MG tablet Take 20 mg by mouth 2 (two) times daily.     Marland Kitchen gabapentin (NEURONTIN) 400 MG capsule Take 1 capsule (400 mg total) by mouth 3 (three) times daily. 90 capsule 3  . HYDROmorphone (DILAUDID) 4 MG tablet Take 1 tablet (4 mg total) by mouth  every 6 (six) hours as needed for severe pain. 40 tablet 0  . meclizine (ANTIVERT) 25 MG tablet Take 1 tablet (25 mg total) by mouth 2 (two) times daily as needed for dizziness. 30 tablet 0  . NARCAN 4 MG/0.1ML LIQD nasal spray kit CALL 911. ADMINISTER A SINGLE SPRAY OF NARCAN IN ONE NOSTRIL. REPEAT EVERY 3 MINUTES AS NEEDED IF NO OR MINIMAL RESPONSE.    . nitroGLYCERIN (NITROSTAT) 0.4 MG SL tablet Place 1 tablet (0.4 mg total) under the tongue every 5 (five) minutes as needed for chest pain. X 3 doses 25 tablet 12  . pantoprazole (PROTONIX) 40 MG tablet Take 1 tablet (40 mg total) by mouth daily. 30 tablet 3  . polyethylene glycol powder (GLYCOLAX/MIRALAX) 17 GM/SCOOP powder Take 17 g by mouth daily as needed for mild constipation or moderate constipation.     . predniSONE (DELTASONE) 5 MG tablet Take 1 tablet (5 mg total) by mouth daily with breakfast. 30 tablet 6  . tamsulosin (FLOMAX) 0.4 MG CAPS capsule Take 0.4 mg by mouth daily.    Marland Kitchen thiamine 100 MG tablet Take 1 tablet (100 mg total) by mouth daily.     No current facility-administered medications for this visit.    ALLERGIES:  Allergies  Allergen Reactions  . Lipitor [Atorvastatin]     Muscles aches    PHYSICAL EXAM:  Performance status (ECOG): 1 - Symptomatic but completely ambulatory  Vitals:   10/12/19 1033  BP: (!) 143/72  Pulse: 65  Resp: 18  Temp: (!) 97.1 F (36.2 C)  SpO2: 96%   Wt Readings from Last 3 Encounters:  10/12/19 152 lb 9.6 oz (69.2 kg)  09/26/19 149 lb (67.6 kg)  08/24/19 143 lb (64.9 kg)   Physical Exam Vitals reviewed.  Constitutional:      Appearance: Normal appearance.  Cardiovascular:     Rate and Rhythm: Normal rate and regular rhythm.     Pulses: Normal pulses.     Heart sounds: Normal heart sounds.  Pulmonary:     Effort: Pulmonary effort is normal.     Breath sounds: Normal breath sounds.  Musculoskeletal:     Right lower leg: Edema (1+) present.     Left lower leg: Edema (1+)  present.  Neurological:     General: No focal deficit present.     Mental Status:  He is alert and oriented to person, place, and time.  Psychiatric:        Mood and Affect: Mood normal.        Behavior: Behavior normal.      LABORATORY DATA:  I have reviewed the labs as listed.  CBC Latest Ref Rng & Units 10/12/2019 09/26/2019 08/14/2019  WBC 4.0 - 10.5 K/uL 5.0 5.5 9.4  Hemoglobin 13.0 - 17.0 g/dL 12.9(L) 13.4 13.7  Hematocrit 39 - 52 % 40.2 40.4 42.2  Platelets 150 - 400 K/uL 308 265 181   CMP Latest Ref Rng & Units 10/12/2019 09/26/2019 08/14/2019  Glucose 70 - 99 mg/dL 104(H) 101(H) 117(H)  BUN 8 - 23 mg/dL _0 Creatinine 0.61 - 1.24 mg/dL 0.57(L) 0.54(L) 0.55(L)  Sodium 135 - 145 mmol/L 138 137 133(L)  Potassium 3.5 - 5.1 mmol/L 3.3(L) 3.8 4.7  Chloride 98 - 111 mmol/L 105 104 94(L)  CO2 22 - 32 mmol/L _1 Calcium 8.9 - 10.3 mg/dL 8.8(L) 8.7(L) 8.7(L)  Total Protein 6.5 - 8.1 g/dL 6.3(L) 6.5 6.6  Total Bilirubin 0.3 - 1.2 mg/dL 0.5 0.3 0.4  Alkaline Phos 38 - 126 U/L 164(H) 238(H) 191(H)  AST 15 - 41 U/L _2 ALT 0 - 44 U/L _3 Lab Results  Component Value Date   LDH 203 (H) 10/12/2019   LDH 197 (H) 08/14/2019  No results found for: PSA   DIAGNOSTIC IMAGING:  I have independently reviewed the scans and discussed with the patient. No results found.   ASSESSMENT:  1. Metastatic castration sensitive prostate cancer: -History of prostate cancer diagnosed 08/28/2014, Gleason 4+4=8, status post XRT and seed implants. -Presentation with low back pain of several months, PSA on 06/19/2019 was 239, testosterone 210, alkaline phosphatase 306. -MRI of the cervical, thoracic and lumbar spine on 07/27/2019 shows diffuse bone metastatic disease. Mild to moderate compression deformity of T3 with mild ventral epidural extension. No cord compression. 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 -CT angio of the chest, abdomen and pelvis on 06/27/2019 shows multiple lung nodules bilaterally, largest in the left upper lobe measuring 2.5 x 1.6 x 1.5 cm with associated left hilar and AP window lymphadenopathy. -Navigational bronchoscopy and biopsy on 07/02/2019 with upper lobe brushing and washing shows atypical cells. 10 L FNA also shows atypical cells. -1 pack/day for 4-5 years, quit 60 years ago. Positive agent orange exposure -Lupron 45 mg on 07/12/2019 along with 1 month ofCasodex. -PET scan on 08/11/2019 shows mildly hypermetabolic widespread sclerotic osseous metastasis throughout the axial and proximal appendicular skeleton, increased sclerosis since 06/27/2018 on CT angiogram. This could represent response to therapy. No hypermetabolic lymphadenopathy. Left mediastinal and left hilar adenopathy is seen on 06/19/2019 has resolved. Bilateral pulmonary nodules are stable to decreased in size. Dominant left upper lobe lung nodule has decreased from 2.5 cm to 1.7 cm. -Abiraterone was started around 08/29/2019.  2.  Low back pain: -10 treatments of radiation therapy completed on 07/11/2019.   PLAN:  1. Metastatic castration sensitive prostate cancer: -He is taking abiraterone 2 tablets twice daily.  I have told him to take 4 tablets at bedtime to prevent nausea. -He is reportedly not taking prednisone 5 mg.  Hence his potassium is low at 3.3.  He also has ankle swellings and systolic blood pressure of 143. -I have recommended that he start taking  prednisone.  He has enough refills on prednisone. -His PSA has improved to 0.07.  His LFTs also show improvement in alkaline phosphatase. -I plan to see him back in 2 weeks for follow-up.  2. Low back pain: -Continue fentanyl patch.  Continue Dilaudid 4 mg every 6 hours.  I have sent refills.  Pain is well controlled.  Pain is down to 5 and is very manageable.  3. Right leg swelling: -We have done Doppler on  07/12/2019 which was negative. -He does have bilateral swellings at this time.  He was told to start taking prednisone 5 mg.  If there is no improvement will consider Lasix.   Orders placed this encounter:  No orders of the defined types were placed in this encounter.    Derek Jack, MD [place] [phone]   I, Milinda Antis, am acting as a scribe for Dr. Sanda Linger.  I, Derek Jack MD, have reviewed the above documentation for accuracy and completeness, and I agree with the above.

## 2019-10-12 NOTE — Progress Notes (Signed)
Patient's prednisone refilled at [facility] per Dr. Delton Coombes.

## 2019-10-18 ENCOUNTER — Other Ambulatory Visit (HOSPITAL_COMMUNITY): Payer: Self-pay | Admitting: Hematology

## 2019-10-18 ENCOUNTER — Other Ambulatory Visit (HOSPITAL_COMMUNITY): Payer: Self-pay | Admitting: Nurse Practitioner

## 2019-10-18 DIAGNOSIS — C61 Malignant neoplasm of prostate: Secondary | ICD-10-CM

## 2019-10-18 MED FILL — ABIRATERONE ACETATE 250 MG: 250 | 30 days supply | Qty: 120 | Fill #0

## 2019-10-29 ENCOUNTER — Other Ambulatory Visit (HOSPITAL_COMMUNITY): Payer: Self-pay | Admitting: *Deleted

## 2019-10-29 DIAGNOSIS — C61 Malignant neoplasm of prostate: Secondary | ICD-10-CM

## 2019-10-29 DIAGNOSIS — C7951 Secondary malignant neoplasm of bone: Secondary | ICD-10-CM

## 2019-10-29 MED ORDER — HYDROMORPHONE HCL 4 MG PO TABS: 4.0000 mg | ORAL_TABLET | Freq: Four times a day (QID) | ORAL | 0 refills | Status: DC | PRN

## 2019-11-01 ENCOUNTER — Inpatient Hospital Stay (HOSPITAL_COMMUNITY): Payer: Medicare Other

## 2019-11-01 ENCOUNTER — Inpatient Hospital Stay (HOSPITAL_COMMUNITY): Payer: Medicare Other | Attending: Hematology | Admitting: Hematology

## 2019-11-01 ENCOUNTER — Other Ambulatory Visit: Payer: Self-pay

## 2019-11-01 VITALS — BP 186/84 | HR 57 | Temp 97.0°F | Resp 18 | Wt 152.1 lb

## 2019-11-01 DIAGNOSIS — K5900 Constipation, unspecified: Secondary | ICD-10-CM | POA: Diagnosis not present

## 2019-11-01 DIAGNOSIS — C61 Malignant neoplasm of prostate: Secondary | ICD-10-CM | POA: Diagnosis not present

## 2019-11-01 DIAGNOSIS — C7951 Secondary malignant neoplasm of bone: Secondary | ICD-10-CM

## 2019-11-01 LAB — COMPREHENSIVE METABOLIC PANEL
ALT: 17 U/L (ref 0–44)
AST: 21 U/L (ref 15–41)
Albumin: 3.8 g/dL (ref 3.5–5.0)
Alkaline Phosphatase: 131 U/L — ABNORMAL HIGH (ref 38–126)
Anion gap: 10 (ref 5–15)
BUN: 13 mg/dL (ref 8–23)
CO2: 26 mmol/L (ref 22–32)
Calcium: 9.2 mg/dL (ref 8.9–10.3)
Chloride: 104 mmol/L (ref 98–111)
Creatinine, Ser: 0.54 mg/dL — ABNORMAL LOW (ref 0.61–1.24)
GFR calc Af Amer: >60 mL/min (ref >60)
GFR calc non Af Amer: >60 mL/min (ref >60)
Glucose, Bld: 105 mg/dL — ABNORMAL HIGH (ref 70–99)
Potassium: 4.2 mmol/L (ref 3.5–5.1)
Sodium: 140 mmol/L (ref 135–145)
Total Bilirubin: 0.3 mg/dL (ref 0.3–1.2)
Total Protein: 6.8 g/dL (ref 6.5–8.1)

## 2019-11-01 LAB — CBC WITH DIFFERENTIAL/PLATELET
Abs Immature Granulocytes: 0.02 10*3/uL (ref 0.00–0.07)
Basophils Absolute: 0.1 10*3/uL (ref 0.0–0.1)
Basophils Relative: 1 %
Eosinophils Absolute: 0.1 10*3/uL (ref 0.0–0.5)
Eosinophils Relative: 1 %
HCT: 40.8 % (ref 39.0–52.0)
Hemoglobin: 13.2 g/dL (ref 13.0–17.0)
Immature Granulocytes: 0 %
Lymphocytes Relative: 17 %
Lymphs Abs: 1.2 10*3/uL (ref 0.7–4.0)
MCH: 31.1 pg (ref 26.0–34.0)
MCHC: 32.4 g/dL (ref 30.0–36.0)
MCV: 96 fL (ref 80.0–100.0)
Monocytes Absolute: 0.6 10*3/uL (ref 0.1–1.0)
Monocytes Relative: 9 %
Neutro Abs: 5.1 10*3/uL (ref 1.7–7.7)
Neutrophils Relative %: 72 %
Platelets: 260 10*3/uL (ref 150–400)
RBC: 4.25 MIL/uL (ref 4.22–5.81)
RDW: 12.6 % (ref 11.5–15.5)
WBC: 7.1 10*3/uL (ref 4.0–10.5)
nRBC: 0 % (ref 0.0–0.2)

## 2019-11-01 LAB — PSA: Prostatic Specific Antigen: 0.03 ng/mL (ref 0.00–4.00)

## 2019-11-01 NOTE — Patient Instructions (Addendum)
[place] at [facility] Discharge Instructions  You were seen today by Dr. Delton Coombes. He went over your recent results. You will be scheduled for an injection for your bones in 1 week. Purchase stool softener over the counter and take daily to prevent getting constipation. Dr. Delton Coombes will see you back in 5 weeks for labs and follow up.   Thank you for choosing [place] at [facility] to provide your oncology and hematology care.  To afford each patient quality time with our provider, please arrive at least 15 minutes before your scheduled appointment time.   If you have a lab appointment with the [facility] please come in thru the Main Entrance and check in at the main information desk  You need to re-schedule your appointment should you arrive 10 or more minutes late.  We strive to give you quality time with our providers, and arriving late affects you and other patients whose appointments are after yours.  Also, if you no show three or more times for appointments you may be dismissed from the clinic at the providers discretion.     Again, thank you for choosing [facility].  Our hope is that these requests will decrease the amount of time that you wait before being seen by our physicians.       _____________________________________________________________  Should you have questions after your visit to [facility], please contact our office at [phone] between the hours of 8:00 a.m. and 4:30 p.m.  Voicemails left after 4:00 p.m. will not be returned until the following business day.  For prescription refill requests, have your pharmacy contact our office and allow 72 hours.    Cancer Center Support Programs:   > Cancer Support Group  2nd Tuesday of the month 1pm-2pm, Journey Room

## 2019-11-01 NOTE — Progress Notes (Signed)
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    CANCER STAGING: Cancer Staging No matching staging information was found for the patient.  INTERVAL HISTORY:  [name], a 79 y.o. male, returns for routine follow-up of his metastatic castration sensitive prostate cancer. Keith Olson was last seen on 10/12/2019.   Today he is accompanied by his son. His pain is controlled well with Dilaudid and fentanyl patches; the fentanyl patches have been causing him to have lucid dreams. He takes Zytiga 4 tablets before going to sleep, usually 3 hours after his dinner. He denies having N/V. He has false teeth in his lower jaw and the front 2 teeth.   REVIEW OF SYSTEMS:  Review of Systems  Constitutional: Positive for appetite change (moderately decreased) and fatigue (moderate).  Cardiovascular: Negative for leg swelling.  Gastrointestinal: Positive for constipation. Negative for nausea and vomiting.  Psychiatric/Behavioral: Positive for sleep disturbance.  All other systems reviewed and are negative.   PAST MEDICAL/SURGICAL HISTORY:  Past Medical History:  Diagnosis Date  . Agent orange exposure   . Arthritis   . Coronary artery disease    BMS to mid LAD 2001, DES to proximal LAD 2017  . Enlarged prostate    XRT 2016  . Headache    Sinus headaches   . Heart abnormality   . History of depression   . Hyperlipidemia   . Hypothyroidism   . Prostate cancer ([place]) 07/2014   hormonal therapy, external beam radiation therapy  . PTSD (post-traumatic stress disorder)    Past  Surgical History:  Procedure Laterality Date  . BRONCHIAL BRUSHINGS  07/02/2019   Procedure: BRONCHIAL BRUSHINGS;  Surgeon: Rigoberto Noel, MD;  Location: [facility];  Service: Cardiopulmonary;;  . BRONCHIAL NEEDLE ASPIRATION BIOPSY  07/02/2019   Procedure: BRONCHIAL NEEDLE ASPIRATION BIOPSIES;  Surgeon: Rigoberto Noel, MD;  Location: [facility];  Service: Cardiopulmonary;;  . BRONCHIAL WASHINGS  07/02/2019   Procedure: BRONCHIAL WASHINGS;  Surgeon: Rigoberto Noel, MD;  Location: [facility];  Service: Cardiopulmonary;;  . CARDIAC CATHETERIZATION N/A 12/12/2015   Procedure: Left Heart Cath and Coronary Angiography;  Surgeon: Peter M Martinique, MD;  Location: [facility];  Service: Cardiovascular;  Laterality: N/A;  . CARDIAC CATHETERIZATION N/A 12/12/2015   Procedure: Coronary Stent Intervention;  Surgeon: Peter M Martinique, MD;  Location: [facility];  Service: Cardiovascular;  Laterality: N/A;  . CORONARY STENT INTERVENTION N/A 09/06/2017   Procedure: CORONARY STENT INTERVENTION;  Surgeon: Burnell Blanks, MD;  Location: [facility];  Service: Cardiovascular;  Laterality: N/A;  . ENDOBRONCHIAL ULTRASOUND N/A 07/02/2019   Procedure: ENDOBRONCHIAL ULTRASOUND;  Surgeon: Rigoberto Noel, MD;  [facility]: [facility];  Service: Cardiopulmonary;  Laterality: N/A;  . FLEXIBLE BRONCHOSCOPY  07/02/2019   Procedure: FLEXIBLE BRONCHOSCOPY;  Surgeon: Rigoberto Noel, MD;  [facility];  Service: Cardiopulmonary;;  . HERNIA REPAIR Right   . LEFT HEART CATH AND CORONARY ANGIOGRAPHY N/A 09/02/2016   Procedure: Left Heart Cath and Coronary Angiography;  Surgeon: Leonie Man, MD;  Location: [facility];  Service: Cardiovascular;  Laterality: N/A;  . LEFT HEART CATH AND CORONARY ANGIOGRAPHY N/A 09/06/2017   Procedure: LEFT HEART CATH AND CORONARY ANGIOGRAPHY;  Surgeon: Burnell Blanks, MD;  Location: [facility];  Service: Cardiovascular;  Laterality: N/A;  . LEFT  HEART CATH AND CORONARY ANGIOGRAPHY N/A 05/04/2019   Procedure: LEFT HEART CATH AND CORONARY ANGIOGRAPHY;  Surgeon: Martinique, Peter M, MD;  Location: [facility];  Service: Cardiovascular;  Laterality: N/A;  . PROSTATE BIOPSY N/A 08/28/2014   Procedure: BIOPSY TRANSRECTAL ULTRASONIC PROSTATE (TUBP);  Surgeon: Rana Snare, MD;  [facility];  Service: Urology;  Laterality: N/A;  . TRANSURETHRAL RESECTION OF PROSTATE N/A 08/28/2014   Procedure: TRANSURETHRAL RESECTION OF THE PROSTATE WITH GYRUS INSTRUMENTS;  Surgeon: Rana Snare, MD;  Location: [facility];  Service: Urology;  Laterality: N/A;    SOCIAL HISTORY:  Social History   Socioeconomic History  . Marital status: Married    Spouse name: Not on file  . Number of children: 5  . Years of education: Not on file  . Highest education level: Not on file  Occupational History  . Occupation: retired  Tobacco Use  . Smoking status: Never Smoker  . Smokeless tobacco: Former Systems developer    Types: Secondary school teacher  . Vaping Use: Never used  Substance and Sexual Activity  . Alcohol use: No  . Drug use: No  . Sexual activity: Yes  Other Topics Concern  . Not on file  Social History Narrative   Married   5 children   Social Determinants of Health   Financial Resource Strain: Low Risk   . Difficulty of Paying Living Expenses: Not hard at all  Food Insecurity: No Food Insecurity  . Worried About Charity fundraiser in the Last Year: Never true  . Ran Out of Food in the Last Year: Never true  Transportation Needs: No Transportation Needs  . Lack of Transportation (Medical): No  . Lack of Transportation (Non-Medical): No  Physical Activity: Inactive  . Days of Exercise per Week: 0 days  . Minutes of Exercise per Session: 0 min  Stress: No Stress Concern Present  . Feeling of Stress : Not at all  Social Connections: Moderately Isolated  . Frequency of Communication with Friends and Family: More than three times a week  . Frequency of  Social Gatherings with Friends and Family: More than three times a week  . Attends Religious Services: Never  . Active Member of Clubs or Organizations: No  . Attends Archivist Meetings: Never  . Marital Status: Married  Human resources officer Violence: Not At Risk  . Fear of Current or Ex-Partner: No  . Emotionally Abused: No  . Physically Abused: No  . Sexually Abused: No    FAMILY HISTORY:  Family History  Problem Relation Age of Onset  . Arthritis Mother   . Heart attack Father     CURRENT MEDICATIONS:  Current Outpatient Medications  Medication Sig Dispense Refill  . abiraterone acetate (ZYTIGA) 250 MG tablet TAKE 4 TABLETS (1,000 MG TOTAL) BY MOUTH DAILY. TAKE ON AN EMPTY STOMACH 1 HOUR BEFORE OR 2 HOURS AFTER A MEAL 120 tablet 0  . abiraterone acetate (ZYTIGA) 250 MG tablet TAKE 4 TABLETS (1,000 MG TOTAL) BY MOUTH DAILY. TAKE ON AN EMPTY STOMACH 1 HOUR BEFORE OR 2 HOURS AFTER A MEAL 120 tablet 0  . ALPRAZolam (XANAX) 0.5 MG tablet Take 1 tablet (0.5 mg total) by mouth 2 (two) times  daily as needed for anxiety. 10 tablet 0  . aspirin EC 81 MG tablet Take 1 tablet (81 mg total) by mouth daily.    . bicalutamide (CASODEX) 50 MG tablet Take 1 tablet (50 mg total) by mouth daily. 30 tablet 0  . dexamethasone (DECADRON) 4 MG tablet Take 1 tablet (4 mg total) by mouth 2 (two) times daily with a meal. 60 tablet 1  . diclofenac sodium (VOLTAREN) 1 % GEL APPLY 2 GRAMS TOPICALLY 4 TIMES DAILY 100 g 5  . DULoxetine (CYMBALTA) 60 MG capsule Take 60 mg by mouth daily.    . fentaNYL (DURAGESIC) 25 MCG/HR Place 1 patch onto the skin every 3 (three) days. 5 patch 0  . folic acid (FOLVITE) 1 MG tablet Take 1 tablet (1 mg total) by mouth daily.    . furosemide (LASIX) 20 MG tablet Take 20 mg by mouth 2 (two) times daily.     Marland Kitchen gabapentin (NEURONTIN) 400 MG capsule Take 1 capsule (400 mg total) by mouth 3 (three) times daily. 90 capsule 3  . HYDROmorphone (DILAUDID) 4 MG tablet Take 1  tablet (4 mg total) by mouth every 6 (six) hours as needed for severe pain. 60 tablet 0  . meclizine (ANTIVERT) 25 MG tablet Take 1 tablet (25 mg total) by mouth 2 (two) times daily as needed for dizziness. 30 tablet 0  . NARCAN 4 MG/0.1ML LIQD nasal spray kit CALL 911. ADMINISTER A SINGLE SPRAY OF NARCAN IN ONE NOSTRIL. REPEAT EVERY 3 MINUTES AS NEEDED IF NO OR MINIMAL RESPONSE.    . pantoprazole (PROTONIX) 40 MG tablet Take 1 tablet (40 mg total) by mouth daily. 30 tablet 3  . polyethylene glycol powder (GLYCOLAX/MIRALAX) 17 GM/SCOOP powder Take 17 g by mouth daily as needed for mild constipation or moderate constipation.     . predniSONE (DELTASONE) 5 MG tablet Take 1 tablet (5 mg total) by mouth daily with breakfast. 30 tablet 6  . tamsulosin (FLOMAX) 0.4 MG CAPS capsule Take 0.4 mg by mouth daily.    Marland Kitchen thiamine 100 MG tablet Take 1 tablet (100 mg total) by mouth daily.    . nitroGLYCERIN (NITROSTAT) 0.4 MG SL tablet Place 1 tablet (0.4 mg total) under the tongue every 5 (five) minutes as needed for chest pain. X 3 doses (Patient not taking: Reported on 11/01/2019) 25 tablet 12   No current facility-administered medications for this visit.    ALLERGIES:  Allergies  Allergen Reactions  . Lipitor [Atorvastatin]     Muscles aches    PHYSICAL EXAM:  Performance status (ECOG): 1 - Symptomatic but completely ambulatory  Vitals:   11/01/19 1546  BP: (!) 186/84  Pulse: (!) 57  Resp: 18  Temp: (!) 97 F (36.1 C)  SpO2: 94%   Wt Readings from Last 3 Encounters:  11/01/19 152 lb 1.6 oz (69 kg)  10/12/19 152 lb 9.6 oz (69.2 kg)  09/26/19 149 lb (67.6 kg)   Physical Exam Vitals reviewed.  Constitutional:      Appearance: Normal appearance.  Cardiovascular:     Rate and Rhythm: Normal rate and regular rhythm.     Pulses: Normal pulses.     Heart sounds: Normal heart sounds.  Pulmonary:     Effort: Pulmonary effort is normal.     Breath sounds: Normal breath sounds.    Musculoskeletal:     Right lower leg: No edema.     Left lower leg: No edema.  Neurological:  General: No focal deficit present.     Mental Status: He is alert and oriented to person, place, and time.  Psychiatric:        Mood and Affect: Mood normal.        Behavior: Behavior normal.      LABORATORY DATA:  I have reviewed the labs as listed.  CBC Latest Ref Rng & Units 11/01/2019 10/12/2019 09/26/2019  WBC 4.0 - 10.5 K/uL 7.1 5.0 5.5  Hemoglobin 13.0 - 17.0 g/dL 13.2 12.9(L) 13.4  Hematocrit 39 - 52 % 40.8 40.2 40.4  Platelets 150 - 400 K/uL 260 308 265   CMP Latest Ref Rng & Units 10/12/2019 09/26/2019 08/14/2019  Glucose 70 - 99 mg/dL 104(H) 101(H) 117(H)  BUN 8 - 23 mg/dL _0 Creatinine 0.61 - 1.24 mg/dL 0.57(L) 0.54(L) 0.55(L)  Sodium 135 - 145 mmol/L 138 137 133(L)  Potassium 3.5 - 5.1 mmol/L 3.3(L) 3.8 4.7  Chloride 98 - 111 mmol/L 105 104 94(L)  CO2 22 - 32 mmol/L _1 Calcium 8.9 - 10.3 mg/dL 8.8(L) 8.7(L) 8.7(L)  Total Protein 6.5 - 8.1 g/dL 6.3(L) 6.5 6.6  Total Bilirubin 0.3 - 1.2 mg/dL 0.5 0.3 0.4  Alkaline Phos 38 - 126 U/L 164(H) 238(H) 191(H)  AST 15 - 41 U/L _2 ALT 0 - 44 U/L _3 Lab Results  Component Value Date   LDH 203 (H) 10/12/2019   LDH 197 (H) 08/14/2019   Lab Results  Component Value Date   PSA  11/01/2019   PSA 0.07 10/12/2019    DIAGNOSTIC IMAGING:  I have independently reviewed the scans and discussed with the patient. No results found.   ASSESSMENT:  1. Metastatic castration sensitive prostate cancer: -History of prostate cancer diagnosed 08/28/2014, Gleason 4+4=8, status post XRT and seed implants. -Presentation with low back pain of several months, PSA on 06/19/2019 was 239, testosterone 210, alkaline phosphatase 306. -MRI of the cervical, thoracic and lumbar spine on 07/27/2019 shows diffuse bone metastatic disease. Mild to moderate compression deformity of T3 with mild ventral epidural extension. No cord  compression. 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 -CT angio of the chest, abdomen and pelvis on 06/27/2019 shows multiple lung nodules bilaterally, largest in the left upper lobe measuring 2.5 x 1.6 x 1.5 cm with associated left hilar and AP window lymphadenopathy. -Navigational bronchoscopy and biopsy on 07/02/2019 with upper lobe brushing and washing shows atypical cells. 10 L FNA also shows atypical cells. -1 pack/day for 4-5 years, quit 60 years ago. Positive agent orange exposure -Lupron 45 mg on 07/12/2019 along with 1 month ofCasodex. -PET scan on 08/11/2019 shows mildly hypermetabolic widespread sclerotic osseous metastasis throughout the axial and proximal appendicular skeleton, increased sclerosis since 06/27/2018 on CT angiogram. This could represent response to therapy. No hypermetabolic lymphadenopathy. Left mediastinal and left hilar adenopathy is seen on 06/19/2019 has resolved. Bilateral pulmonary nodules are stable to decreased in size. Dominant left upper lobe lung nodule has decreased from 2.5 cm to 1.7 cm. -Abiraterone was started around 08/29/2019.  2. Low back pain: -10 treatments of radiation therapy completed on 07/11/2019.   PLAN:  1. Metastatic castration sensitive prostate cancer: -He is taking Abiraterone 4 tablets at bedtime which is very well tolerated.  He is also taking prednisone.  Blood pressure today is slightly high as he is anxious and in pain. -Reviewed  his labs.  Alk phos is 131 and improving.  Rest of LFTs are normal.  Potassium was 4.2.  CBC was normal.  PSA also continued to improve. -RTC 5 weeks with labs.  2. Low back pain: -He could not tolerate fentanyl patch because of hallucinations. -He is taking Dilaudid 4 mg every 6 hours which is helping.  3. Right leg swelling: -His bilateral leg swellings have improved since the start of treatment for his  prostate cancer. -Continue Lasix as needed.  4.  Constipation: -We have suggested him to start taking stool softeners.  5.  Bone mets: -Continue denosumab monthly.  Calcium is within normal limits.   Orders placed this encounter:  No orders of the defined types were placed in this encounter.    Derek Jack, MD [place] [phone]   I, Milinda Antis, am acting as a scribe for Dr. Sanda Linger.  I, Derek Jack MD, have reviewed the above documentation for accuracy and completeness, and I agree with the above.

## 2019-11-02 ENCOUNTER — Encounter (HOSPITAL_COMMUNITY): Payer: Self-pay | Admitting: *Deleted

## 2019-11-02 NOTE — Progress Notes (Signed)
Patient called clinic this morning with questions about the new injection that Dr. Delton Coombes talked with him about yesterday.  I explained the reason the shot will be given and the schedule for the injections.  I explained to him that he will need to take a calcium and vitamin D supplement along with the injection.  He wrote down the instructions and read them back to me.  Patient was given the opportunity to ask questions and all were answered to his satisfaction.

## 2019-11-06 DIAGNOSIS — M5440 Lumbago with sciatica, unspecified side: Secondary | ICD-10-CM | POA: Diagnosis not present

## 2019-11-06 DIAGNOSIS — R918 Other nonspecific abnormal finding of lung field: Secondary | ICD-10-CM | POA: Diagnosis not present

## 2019-11-06 MED FILL — predniSONE 5 MG TABS: 5 | 30 days supply | Qty: 30 | Fill #2

## 2019-11-08 ENCOUNTER — Encounter (HOSPITAL_COMMUNITY): Payer: Self-pay

## 2019-11-08 ENCOUNTER — Inpatient Hospital Stay (HOSPITAL_COMMUNITY): Payer: Medicare Other

## 2019-11-08 ENCOUNTER — Other Ambulatory Visit: Payer: Self-pay

## 2019-11-08 VITALS — BP 158/84 | HR 62 | Temp 96.9°F | Resp 16

## 2019-11-08 DIAGNOSIS — K5900 Constipation, unspecified: Secondary | ICD-10-CM | POA: Diagnosis not present

## 2019-11-08 DIAGNOSIS — C61 Malignant neoplasm of prostate: Secondary | ICD-10-CM

## 2019-11-08 DIAGNOSIS — C7951 Secondary malignant neoplasm of bone: Secondary | ICD-10-CM | POA: Diagnosis not present

## 2019-11-08 MED ORDER — DENOSUMAB 120 MG/1.7ML ~~LOC~~ SOLN
120.0000 mg | Freq: Once | SUBCUTANEOUS | Status: AC
  Administered 2019-11-08: 120 mg via SUBCUTANEOUS

## 2019-11-08 MED ORDER — DENOSUMAB 120 MG/1.7ML ~~LOC~~ SOLN
SUBCUTANEOUS | Status: AC
  Filled 2019-11-08: qty 1.7

## 2019-11-08 NOTE — Progress Notes (Signed)
Keith Olson. tolerated Xgeva injection well without complaints or incident. Calcium 9.2 today and pt denied any recent or future dental visits prior to administering this medication. Reviewed purpose and side effects and drug sheet given to pt who verbalized understanding. VSS Pt discharged self ambulatory using his cane in satisfactory condition

## 2019-11-08 NOTE — Patient Instructions (Signed)
[place] at [facility] Discharge Instructions  Received Keith Olson injection today. Follow-up as scheduled   Thank you for choosing [place] at [facility] to provide your oncology and hematology care.  To afford each patient quality time with our provider, please arrive at least 15 minutes before your scheduled appointment time.   If you have a lab appointment with the [facility] please come in thru the Main Entrance and check in at the main information desk.  You need to re-schedule your appointment should you arrive 10 or more minutes late.  We strive to give you quality time with our providers, and arriving late affects you and other patients whose appointments are after yours.  Also, if you no show three or more times for appointments you may be dismissed from the clinic at the providers discretion.     Again, thank you for choosing [facility].  Our hope is that these requests will decrease the amount of time that you wait before being seen by our physicians.       _____________________________________________________________  Should you have questions after your visit to [facility], please contact our office at [phone] and follow the prompts.  Our office hours are 8:00 a.m. and 4:30 p.m. Monday - Friday.  Please note that voicemails left after 4:00 p.m. may not be returned until the following business day.  We are closed weekends and major holidays.  You do have access to a nurse 24-7, just call the main number to the clinic [phone] and do not press any options, hold on the line and a nurse will answer the phone.    For prescription refill requests, have your pharmacy contact our office and allow 72 hours.    Due to Covid, you will need to wear a mask upon entering the hospital. If you do not have a mask, a mask will be given to you at the Main Entrance upon arrival. For doctor visits, patients may have 1  support person age 54 or older with them. For treatment visits, patients can not have anyone with them due to social distancing guidelines and our immunocompromised population.

## 2019-11-15 MED FILL — ABIRATERONE ACETATE 250 MG: 250 | 30 days supply | Qty: 120 | Fill #0

## 2019-11-20 ENCOUNTER — Other Ambulatory Visit (HOSPITAL_COMMUNITY): Payer: Self-pay | Admitting: *Deleted

## 2019-11-20 DIAGNOSIS — C7951 Secondary malignant neoplasm of bone: Secondary | ICD-10-CM

## 2019-11-20 DIAGNOSIS — C61 Malignant neoplasm of prostate: Secondary | ICD-10-CM

## 2019-11-20 MED ORDER — FENTANYL 25 MCG/HR TD PT72: 1.0000 | MEDICATED_PATCH | TRANSDERMAL | 0 refills | Status: DC

## 2019-11-21 DIAGNOSIS — J019 Acute sinusitis, unspecified: Secondary | ICD-10-CM | POA: Diagnosis not present

## 2019-11-22 ENCOUNTER — Other Ambulatory Visit (HOSPITAL_COMMUNITY): Payer: Self-pay | Admitting: *Deleted

## 2019-11-22 DIAGNOSIS — C61 Malignant neoplasm of prostate: Secondary | ICD-10-CM

## 2019-11-22 MED ORDER — HYDROMORPHONE HCL 4 MG PO TABS: 4.0000 mg | ORAL_TABLET | Freq: Two times a day (BID) | ORAL | 0 refills | Status: DC | PRN

## 2019-12-06 ENCOUNTER — Other Ambulatory Visit: Payer: Self-pay

## 2019-12-06 ENCOUNTER — Other Ambulatory Visit (HOSPITAL_COMMUNITY): Payer: Self-pay | Admitting: *Deleted

## 2019-12-06 ENCOUNTER — Inpatient Hospital Stay (HOSPITAL_COMMUNITY): Payer: Medicare Other

## 2019-12-06 ENCOUNTER — Inpatient Hospital Stay (HOSPITAL_COMMUNITY): Payer: Medicare Other | Attending: Hematology | Admitting: Hematology

## 2019-12-06 VITALS — BP 151/72 | HR 67 | Temp 97.1°F | Resp 17 | Wt 155.9 lb

## 2019-12-06 DIAGNOSIS — C7951 Secondary malignant neoplasm of bone: Secondary | ICD-10-CM

## 2019-12-06 DIAGNOSIS — C61 Malignant neoplasm of prostate: Secondary | ICD-10-CM

## 2019-12-06 DIAGNOSIS — M545 Low back pain, unspecified: Secondary | ICD-10-CM | POA: Insufficient documentation

## 2019-12-06 DIAGNOSIS — M5440 Lumbago with sciatica, unspecified side: Secondary | ICD-10-CM | POA: Diagnosis not present

## 2019-12-06 DIAGNOSIS — R918 Other nonspecific abnormal finding of lung field: Secondary | ICD-10-CM | POA: Diagnosis not present

## 2019-12-06 LAB — CBC WITH DIFFERENTIAL/PLATELET
Abs Immature Granulocytes: 0.04 10*3/uL (ref 0.00–0.07)
Basophils Absolute: 0 10*3/uL (ref 0.0–0.1)
Basophils Relative: 1 %
Eosinophils Absolute: 0.1 10*3/uL (ref 0.0–0.5)
Eosinophils Relative: 1 %
HCT: 41.4 % (ref 39.0–52.0)
Hemoglobin: 13.5 g/dL (ref 13.0–17.0)
Immature Granulocytes: 1 %
Lymphocytes Relative: 12 %
Lymphs Abs: 0.9 10*3/uL (ref 0.7–4.0)
MCH: 30.3 pg (ref 26.0–34.0)
MCHC: 32.6 g/dL (ref 30.0–36.0)
MCV: 93 fL (ref 80.0–100.0)
Monocytes Absolute: 0.4 10*3/uL (ref 0.1–1.0)
Monocytes Relative: 5 %
Neutro Abs: 6.5 10*3/uL (ref 1.7–7.7)
Neutrophils Relative %: 80 %
Platelets: 234 10*3/uL (ref 150–400)
RBC: 4.45 MIL/uL (ref 4.22–5.81)
RDW: 12.7 % (ref 11.5–15.5)
WBC: 8 10*3/uL (ref 4.0–10.5)
nRBC: 0 % (ref 0.0–0.2)

## 2019-12-06 LAB — COMPREHENSIVE METABOLIC PANEL
ALT: 17 U/L (ref 0–44)
AST: 19 U/L (ref 15–41)
Albumin: 4 g/dL (ref 3.5–5.0)
Alkaline Phosphatase: 91 U/L (ref 38–126)
Anion gap: 9 (ref 5–15)
BUN: 21 mg/dL (ref 8–23)
CO2: 26 mmol/L (ref 22–32)
Calcium: 9.1 mg/dL (ref 8.9–10.3)
Chloride: 99 mmol/L (ref 98–111)
Creatinine, Ser: 0.53 mg/dL — ABNORMAL LOW (ref 0.61–1.24)
GFR calc non Af Amer: >60 mL/min (ref >60)
Glucose, Bld: 116 mg/dL — ABNORMAL HIGH (ref 70–99)
Potassium: 4.4 mmol/L (ref 3.5–5.1)
Sodium: 134 mmol/L — ABNORMAL LOW (ref 135–145)
Total Bilirubin: 0.3 mg/dL (ref 0.3–1.2)
Total Protein: 6.9 g/dL (ref 6.5–8.1)

## 2019-12-06 LAB — PSA: Prostatic Specific Antigen: 0.04 ng/mL (ref 0.00–4.00)

## 2019-12-06 LAB — LACTATE DEHYDROGENASE: LDH: 145 U/L (ref 98–192)

## 2019-12-06 MED ORDER — FENTANYL 25 MCG/HR TD PT72: 1.0000 | MEDICATED_PATCH | TRANSDERMAL | 0 refills | Status: DC

## 2019-12-06 MED ORDER — DENOSUMAB 120 MG/1.7ML ~~LOC~~ SOLN
120.0000 mg | Freq: Once | SUBCUTANEOUS | Status: AC
  Administered 2019-12-06: 120 mg via SUBCUTANEOUS

## 2019-12-06 MED ORDER — GABAPENTIN 400 MG PO CAPS: 400.0000 mg | ORAL_CAPSULE | Freq: Three times a day (TID) | ORAL | 3 refills | Status: DC

## 2019-12-06 MED ORDER — DRONABINOL 2.5 MG PO CAPS: 2.5000 mg | ORAL_CAPSULE | Freq: Two times a day (BID) | ORAL | 2 refills | Status: DC

## 2019-12-06 NOTE — Progress Notes (Signed)
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    CANCER STAGING: Cancer Staging No matching staging information was found for the patient.  INTERVAL HISTORY:  [name], a 79 y.o. male, returns for routine follow-up of his metastatic castration sensitive prostate cancer. Keith Olson was last seen on 11/01/2019.  Today Keith Olson reports feeling okay. Keith Olson continues having hot flashes which are improving. His pain is well controlled with fentanyl patches and takes Dilaudid BID for the pain. Keith Olson continues taking 4 tablets of Zytiga daily on an empty stomach. His appetite is severely decreased and Keith Olson drinks 2-3 cans of Ensure along with 2 meals daily. Keith Olson nibbles his meals which is his baseline.   REVIEW OF SYSTEMS:  Review of Systems  Constitutional: Positive for appetite change (depleted) and fatigue (25%).  Endocrine: Positive for hot flashes.  All other systems reviewed and are negative.   PAST MEDICAL/SURGICAL HISTORY:  Past Medical History:  Diagnosis Date  . Agent orange exposure   . Arthritis   . Coronary artery disease    BMS to mid LAD 2001, DES to proximal LAD 2017  . Enlarged prostate    XRT 2016  . Headache    Sinus headaches   . Heart abnormality   . History of depression   . Hyperlipidemia   . Hypothyroidism   . Prostate cancer ([place]) 07/2014   hormonal therapy, external beam radiation therapy  . PTSD (post-traumatic stress disorder)    Past Surgical History:  Procedure Laterality Date  . BRONCHIAL BRUSHINGS  07/02/2019   Procedure: BRONCHIAL BRUSHINGS;   Surgeon: Rigoberto Noel, MD;  [facility];  Service: Cardiopulmonary;;  . BRONCHIAL NEEDLE ASPIRATION BIOPSY  07/02/2019   Procedure: BRONCHIAL NEEDLE ASPIRATION BIOPSIES;  Surgeon: Rigoberto Noel, MD;  [facility];  Service: Cardiopulmonary;;  . BRONCHIAL WASHINGS  07/02/2019   Procedure: BRONCHIAL WASHINGS;  Surgeon: Rigoberto Noel, MD;  [facility];  Service: Cardiopulmonary;;  . CARDIAC CATHETERIZATION N/A 12/12/2015   Procedure: Left Heart Cath and Coronary Angiography;  Surgeon: Peter M Martinique, MD;  Location: [place] [facility];  Service: Cardiovascular;  Laterality: N/A;  . CARDIAC CATHETERIZATION N/A 12/12/2015   Procedure: Coronary Stent Intervention;  Surgeon: Peter M Martinique, MD;  Location: [place] [facility];  Service: Cardiovascular;  Laterality: N/A;  . CORONARY STENT INTERVENTION N/A 09/06/2017   Procedure: CORONARY STENT INTERVENTION;  Surgeon: Burnell Blanks, MD;  Location: [place] [facility];  Service: Cardiovascular;  Laterality: N/A;  . ENDOBRONCHIAL ULTRASOUND N/A 07/02/2019   Procedure: ENDOBRONCHIAL ULTRASOUND;  Surgeon: Rigoberto Noel, MD;  [facility];  Service: Cardiopulmonary;  Laterality: N/A;  . FLEXIBLE BRONCHOSCOPY  07/02/2019   Procedure: FLEXIBLE BRONCHOSCOPY;  Surgeon: Rigoberto Noel, MD;  [facility];  Service: Cardiopulmonary;;  . HERNIA REPAIR Right   . LEFT HEART CATH AND CORONARY ANGIOGRAPHY N/A 09/02/2016   Procedure: Left Heart Cath and Coronary Angiography;  Surgeon: Leonie Man, MD;  Location: [place] [facility];  Service: Cardiovascular;  Laterality: N/A;  . LEFT HEART CATH AND CORONARY  ANGIOGRAPHY N/A 09/06/2017   Procedure: LEFT HEART CATH AND CORONARY ANGIOGRAPHY;  Surgeon: Burnell Blanks, MD;  Location: [place] [facility];  Service: Cardiovascular;  Laterality: N/A;  . LEFT HEART CATH AND CORONARY ANGIOGRAPHY N/A 05/04/2019   Procedure: LEFT HEART CATH AND CORONARY ANGIOGRAPHY;  Surgeon:  Martinique, Peter M, MD;  Location: [place] [facility];  Service: Cardiovascular;  Laterality: N/A;  . PROSTATE BIOPSY N/A 08/28/2014   Procedure: BIOPSY TRANSRECTAL ULTRASONIC PROSTATE (TUBP);  Surgeon: Rana Snare, MD;  [facility];  Service: Urology;  Laterality: N/A;  . TRANSURETHRAL RESECTION OF PROSTATE N/A 08/28/2014   Procedure: TRANSURETHRAL RESECTION OF THE PROSTATE WITH GYRUS INSTRUMENTS;  Surgeon: Rana Snare, MD;  [facility];  Service: Urology;  Laterality: N/A;    SOCIAL HISTORY:  Social History   Socioeconomic History  . Marital status: Married    Spouse name: Not on file  . Number of children: 5  . Years of education: Not on file  . Highest education level: Not on file  Occupational History  . Occupation: retired  Tobacco Use  . Smoking status: Never Smoker  . Smokeless tobacco: Former Systems developer    Types: Secondary school teacher  . Vaping Use: Never used  Substance and Sexual Activity  . Alcohol use: No  . Drug use: No  . Sexual activity: Yes  Other Topics Concern  . Not on file  Social History Narrative   Married   5 children   Social Determinants of Health   Financial Resource Strain: Low Risk   . Difficulty of Paying Living Expenses: Not hard at all  Food Insecurity: No Food Insecurity  . Worried About Charity fundraiser in the Last Year: Never true  . Ran Out of Food in the Last Year: Never true  Transportation Needs: No Transportation Needs  . Lack of Transportation (Medical): No  . Lack of Transportation (Non-Medical): No  Physical Activity: Inactive  . Days of Exercise per Week: 0 days  . Minutes of Exercise per Session: 0 min  Stress: No Stress Concern Present  . Feeling of Stress : Not at all  Social Connections: Moderately Isolated  . Frequency of Communication with Friends and Family: More than three times a week  . Frequency of Social Gatherings with Friends and Family: More than three times a week  . Attends Religious Services: Never  .  Active Member of Clubs or Organizations: No  . Attends Archivist Meetings: Never  . Marital Status: Married  Human resources officer Violence: Not At Risk  . Fear of Current or Ex-Partner: No  . Emotionally Abused: No  . Physically Abused: No  . Sexually Abused: No    FAMILY HISTORY:  Family History  Problem Relation Age of Onset  . Arthritis Mother   . Heart attack Father     CURRENT MEDICATIONS:  Current Outpatient Medications  Medication Sig Dispense Refill  . abiraterone acetate (ZYTIGA) 250 MG tablet TAKE 4 TABLETS (1,000 MG TOTAL) BY MOUTH DAILY. TAKE ON AN EMPTY STOMACH 1 HOUR BEFORE OR 2 HOURS AFTER A MEAL 120 tablet 0  . abiraterone acetate (ZYTIGA) 250 MG tablet TAKE 4 TABLETS (1,000 MG TOTAL) BY MOUTH DAILY. TAKE ON AN EMPTY STOMACH 1 HOUR BEFORE OR 2 HOURS AFTER A MEAL 120 tablet 0  . ALPRAZolam (XANAX) 0.5 MG tablet Take 1 tablet (0.5 mg total) by mouth 2 (two) times daily as needed for anxiety. 10 tablet 0  . aspirin EC  81 MG tablet Take 1 tablet (81 mg total) by mouth daily.    Marland Kitchen dexamethasone (DECADRON) 4 MG tablet Take 1 tablet (4 mg total) by mouth 2 (two) times daily with a meal. 60 tablet 1  . diclofenac sodium (VOLTAREN) 1 % GEL APPLY 2 GRAMS TOPICALLY 4 TIMES DAILY 100 g 5  . DULoxetine (CYMBALTA) 60 MG capsule Take 60 mg by mouth daily.    . fentaNYL (DURAGESIC) 25 MCG/HR Place 1 patch onto the skin every 3 (three) days. 5 patch 0  . folic acid (FOLVITE) 1 MG tablet Take 1 tablet (1 mg total) by mouth daily.    . furosemide (LASIX) 20 MG tablet Take 20 mg by mouth 2 (two) times daily.     Marland Kitchen gabapentin (NEURONTIN) 400 MG capsule Take 1 capsule (400 mg total) by mouth 3 (three) times daily. 90 capsule 3  . HYDROmorphone (DILAUDID) 4 MG tablet Take 1 tablet (4 mg total) by mouth every 12 (twelve) hours as needed for severe pain. 60 tablet 0  . meclizine (ANTIVERT) 25 MG tablet Take 1 tablet (25 mg total) by mouth 2 (two) times daily as needed for dizziness. 30  tablet 0  . NARCAN 4 MG/0.1ML LIQD nasal spray kit CALL 911. ADMINISTER A SINGLE SPRAY OF NARCAN IN ONE NOSTRIL. REPEAT EVERY 3 MINUTES AS NEEDED IF NO OR MINIMAL RESPONSE.    . nitroGLYCERIN (NITROSTAT) 0.4 MG SL tablet Place 1 tablet (0.4 mg total) under the tongue every 5 (five) minutes as needed for chest pain. X 3 doses 25 tablet 12  . pantoprazole (PROTONIX) 40 MG tablet Take 1 tablet (40 mg total) by mouth daily. 30 tablet 3  . polyethylene glycol powder (GLYCOLAX/MIRALAX) 17 GM/SCOOP powder Take 17 g by mouth daily as needed for mild constipation or moderate constipation.     . predniSONE (DELTASONE) 5 MG tablet Take 1 tablet (5 mg total) by mouth daily with breakfast. 30 tablet 6  . tamsulosin (FLOMAX) 0.4 MG CAPS capsule Take 0.4 mg by mouth daily.    Marland Kitchen thiamine 100 MG tablet Take 1 tablet (100 mg total) by mouth daily.     No current facility-administered medications for this visit.    ALLERGIES:  Allergies  Allergen Reactions  . Lipitor [Atorvastatin]     Muscles aches    PHYSICAL EXAM:  Performance status (ECOG): 1 - Symptomatic but completely ambulatory  Vitals:   12/06/19 1059  BP: (!) 151/72  Pulse: 67  Resp: 17  Temp: (!) 97.1 F (36.2 C)  SpO2: 97%   Wt Readings from Last 3 Encounters:  12/06/19 155 lb 14.4 oz (70.7 kg)  11/01/19 152 lb 1.6 oz (69 kg)  10/12/19 152 lb 9.6 oz (69.2 kg)   Physical Exam Vitals reviewed.  Constitutional:      Appearance: Normal appearance.  Neurological:     General: No focal deficit present.     Mental Status: Keith Olson is alert and oriented to person, place, and time.  Psychiatric:        Mood and Affect: Mood normal.        Behavior: Behavior normal.      LABORATORY DATA:  I have reviewed the labs as listed.  CBC Latest Ref Rng & Units 12/06/2019 11/01/2019 10/12/2019  WBC 4.0 - 10.5 K/uL 8.0 7.1 5.0  Hemoglobin 13.0 - 17.0 g/dL 13.5 13.2 12.9(L)  Hematocrit 39 - 52 % 41.4 40.8 40.2  Platelets 150 - 400 K/uL 234 260 308  CMP Latest Ref Rng & Units 12/06/2019 11/01/2019 10/12/2019  Glucose 70 - 99 mg/dL 116(H) 105(H) 104(H)  BUN 8 - 23 mg/dL _0 Creatinine 0.61 - 1.24 mg/dL 0.53(L) 0.54(L) 0.57(L)  Sodium 135 - 145 mmol/L 134(L) 140 138  Potassium 3.5 - 5.1 mmol/L 4.4 4.2 3.3(L)  Chloride 98 - 111 mmol/L 99 104 105  CO2 22 - 32 mmol/L _1 Calcium 8.9 - 10.3 mg/dL 9.1 9.2 8.8(L)  Total Protein 6.5 - 8.1 g/dL 6.9 6.8 6.3(L)  Total Bilirubin 0.3 - 1.2 mg/dL 0.3 0.3 0.5  Alkaline Phos 38 - 126 U/L 91 131(H) 164(H)  AST 15 - 41 U/L _2 ALT 0 - 44 U/L _3 Lab Results  Component Value Date   LDH 145 12/06/2019   LDH 203 (H) 10/12/2019   LDH 197 (H) 08/14/2019   PSA 0.03 11/01/2019  PSA 0.07 10/12/2019  PSA 11.96 08/14/2019    DIAGNOSTIC IMAGING:  I have independently reviewed the scans and discussed with the patient. No results found.   ASSESSMENT:  1. Metastatic castration sensitive prostate cancer: -History of prostate cancer diagnosed 08/28/2014, Gleason 4+4=8, status post XRT and seed implants. -Presentation with low back pain of several months, PSA on 06/19/2019 was 239, testosterone 210, alkaline phosphatase 306. -MRI of the cervical, thoracic and lumbar spine on 07/27/2019 shows diffuse bone metastatic disease. Mild to moderate compression deformity of T3 with mild ventral epidural extension. No cord compression. 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 -CT angio of the chest, abdomen and pelvis on 06/27/2019 shows multiple lung nodules bilaterally, largest in the left upper lobe measuring 2.5 x 1.6 x 1.5 cm with associated left hilar and AP window lymphadenopathy. -Navigational bronchoscopy and biopsy on 07/02/2019 with upper lobe brushing and washing shows atypical cells. 10 L FNA also shows atypical cells. -1 pack/day for 4-5 years, quit 60 years ago. Positive agent orange  exposure -Lupron 45 mg on 07/12/2019 along with 1 month ofCasodex. -PET scan on 08/11/2019 shows mildly hypermetabolic widespread sclerotic osseous metastasis throughout the axial and proximal appendicular skeleton, increased sclerosis since 06/27/2018 on CT angiogram. This could represent response to therapy. No hypermetabolic lymphadenopathy. Left mediastinal and left hilar adenopathy is seen on 06/19/2019 has resolved. Bilateral pulmonary nodules are stable to decreased in size. Dominant left upper lobe lung nodule has decreased from 2.5 cm to 1.7 cm. -Abiraterone was started around 08/29/2019.  2. Low back pain: -10 treatments of radiation therapy completed on 07/11/2019.   PLAN:  1. Metastatic castration sensitive prostate cancer: -Continue Abiraterone 4 tablets on empty stomach which is well-tolerated.  Continue prednisone. -Reviewed labs which showed normal LFTs and CBC.  PSA 0.03. -RTC 2 months with labs.  2. Low back pain: -Continue fentanyl 25 mcg patch. -Continue Dilaudid 4 mg 1 to 2 tablets/day.  3.  Loss of appetite: -Keith Olson reports significant loss of appetite.  Keith Olson is drinking 3 to 4 cans of boost per day. -Keith Olson has not lost any significant weight.  We will start him on Marinol 2.5 mg twice daily.  4.  Constipation: -Continue stool softeners.  5.  Bone mets: -Continue denosumab monthly.  Continue calcium and vitamin D.   Orders placed this encounter:  No orders of the defined types were placed in this encounter.    Derek Jack, MD [facility] [phone]   I,  Milinda Antis, am acting as a scribe for Dr. Sanda Linger.  I, Derek Jack MD, have reviewed the above documentation for accuracy and completeness, and I agree with the above.

## 2019-12-06 NOTE — Patient Instructions (Signed)
[place] at [facility] Discharge Instructions  You were seen today by Dr. Delton Coombes. He went over your recent results. Eat more meals and cut down to 2 cans of Ensure daily. You will be prescribed Marinol to take twice daily to improve your appetite. Dr. Delton Coombes will see you back in 2 months for labs and follow up.   Thank you for choosing [place] at [facility] to provide your oncology and hematology care.  To afford each patient quality time with our provider, please arrive at least 15 minutes before your scheduled appointment time.   If you have a lab appointment with the [facility] please come in thru the Main Entrance and check in at the main information desk  You need to re-schedule your appointment should you arrive 10 or more minutes late.  We strive to give you quality time with our providers, and arriving late affects you and other patients whose appointments are after yours.  Also, if you no show three or more times for appointments you may be dismissed from the clinic at the providers discretion.     Again, thank you for choosing [facility].  Our hope is that these requests will decrease the amount of time that you wait before being seen by our physicians.       _____________________________________________________________  Should you have questions after your visit to [facility], please contact our office at [phone] between the hours of 8:00 a.m. and 4:30 p.m.  Voicemails left after 4:00 p.m. will not be returned until the following business day.  For prescription refill requests, have your pharmacy contact our office and allow 72 hours.    Cancer Center Support Programs:   > Cancer Support Group  2nd Tuesday of the month 1pm-2pm, Journey Room

## 2019-12-06 NOTE — Progress Notes (Signed)
1130 Labs reviewed with and pt seen by Dr. Delton Coombes and pt approved for Xgeva injection today per MD                  Keith Olson. tolerated Xgeva injection well without complaints or incident. Calcium 9.1 and pt denied any tooth or jaw pain and no recent or future dental visits prior to administering this medication. Pt continues to take his Calcium PO as prescribed without issues. Pt discharged self ambulatory using his cane in satisfactory condition accompanied by family member

## 2019-12-06 NOTE — Patient Instructions (Signed)
[place] at [facility] Discharge Instructions  Received Keith Olson injection today. Follow-up as scheduled   Thank you for choosing [place] at [facility] to provide your oncology and hematology care.  To afford each patient quality time with our provider, please arrive at least 15 minutes before your scheduled appointment time.   If you have a lab appointment with the [place] please come in thru the Main Entrance and check in at the main information desk.  You need to re-schedule your appointment should you arrive 10 or more minutes late.  We strive to give you quality time with our providers, and arriving late affects you and other patients whose appointments are after yours.  Also, if you no show three or more times for appointments you may be dismissed from the clinic at the providers discretion.     Again, thank you for choosing [facility]  Our hope is that these requests will decrease the amount of time that you wait before being seen by our physicians.       _____________________________________________________________  Should you have questions after your visit to [facility], please contact our office at [phone] and follow the prompts.  Our office hours are 8:00 a.m. and 4:30 p.m. Monday - Friday.  Please note that voicemails left after 4:00 p.m. may not be returned until the following business day.  We are closed weekends and major holidays.  You do have access to a nurse 24-7, just call the main number to the clinic [phone] and do not press any options, hold on the line and a nurse will answer the phone.    For prescription refill requests, have your pharmacy contact our office and allow 72 hours.    Due to Covid, you will need to wear a mask upon entering the hospital. If you do not have a mask, a mask will be given to you at the Main Entrance upon arrival. For doctor visits, patients may have 1  support person age 26 or older with them. For treatment visits, patients can not have anyone with them due to social distancing guidelines and our immunocompromised population.

## 2019-12-07 ENCOUNTER — Encounter (HOSPITAL_COMMUNITY): Payer: Self-pay | Admitting: *Deleted

## 2019-12-07 ENCOUNTER — Other Ambulatory Visit (HOSPITAL_COMMUNITY): Payer: Self-pay

## 2019-12-07 DIAGNOSIS — C61 Malignant neoplasm of prostate: Secondary | ICD-10-CM

## 2019-12-07 NOTE — Progress Notes (Signed)
Prior authorization obtained for Marinol 2.5 mg capsules #60 from 12/07/2019 - 03/08/2020.  PA # [id number]

## 2019-12-10 ENCOUNTER — Other Ambulatory Visit (HOSPITAL_COMMUNITY): Payer: Self-pay | Admitting: Hematology

## 2019-12-10 DIAGNOSIS — C61 Malignant neoplasm of prostate: Secondary | ICD-10-CM

## 2019-12-10 DIAGNOSIS — C7951 Secondary malignant neoplasm of bone: Secondary | ICD-10-CM

## 2019-12-10 MED FILL — predniSONE 5 MG TABS: 5 | 30 days supply | Qty: 30 | Fill #3

## 2019-12-10 MED FILL — ABIRATERONE ACETATE 250 MG: 250 | 30 days supply | Qty: 120 | Fill #0

## 2019-12-11 NOTE — Progress Notes (Signed)
  Radiation Oncology         [phone]) [phone] ________________________________  Name: Keith Olson. MRN: [id number]  Date: 07/11/2019  DOB: 01-26-1941  End of Treatment Note  Diagnosis:    79 yo man with a painful L4 metastasis in a background of both prostate cancer and bronchogenic carcinoma.   - Stage IV     Indication for treatment:  Palliation, preservation of neurologic function       Radiation treatment dates:   06/28/19-07/11/19  Site/dose:   The lumbar spine L3-L5 was treated to 30 Gy in 10 fractions  Beams/energy:   Anterior and posterior 6x fields were used.  Narrative: The patient tolerated radiation treatment relatively well.     Plan: The patient has completed radiation treatment. The patient will return to radiation oncology clinic for routine followup in one month. I advised him to call or return sooner if he has any questions or concerns related to his recovery or treatment. ________________________________  Sheral Apley. Tammi Klippel, M.D.

## 2019-12-21 ENCOUNTER — Other Ambulatory Visit (HOSPITAL_COMMUNITY): Payer: Self-pay | Admitting: *Deleted

## 2019-12-21 DIAGNOSIS — C7951 Secondary malignant neoplasm of bone: Secondary | ICD-10-CM

## 2019-12-21 DIAGNOSIS — C61 Malignant neoplasm of prostate: Secondary | ICD-10-CM

## 2019-12-22 MED ORDER — HYDROMORPHONE HCL 4 MG PO TABS: 4.0000 mg | ORAL_TABLET | Freq: Two times a day (BID) | ORAL | 0 refills | Status: DC | PRN

## 2019-12-24 ENCOUNTER — Other Ambulatory Visit (HOSPITAL_COMMUNITY): Payer: Self-pay

## 2019-12-24 DIAGNOSIS — C61 Malignant neoplasm of prostate: Secondary | ICD-10-CM

## 2019-12-24 DIAGNOSIS — C7951 Secondary malignant neoplasm of bone: Secondary | ICD-10-CM

## 2019-12-24 MED ORDER — FENTANYL 25 MCG/HR TD PT72: 1.0000 | MEDICATED_PATCH | TRANSDERMAL | 0 refills | Status: DC

## 2019-12-26 DIAGNOSIS — J309 Allergic rhinitis, unspecified: Secondary | ICD-10-CM | POA: Diagnosis not present

## 2020-01-02 MED FILL — predniSONE 5 MG TABS: 5 | 30 days supply | Qty: 30 | Fill #4

## 2020-01-03 ENCOUNTER — Inpatient Hospital Stay (HOSPITAL_COMMUNITY): Payer: Medicare Other

## 2020-01-03 ENCOUNTER — Other Ambulatory Visit: Payer: Self-pay

## 2020-01-03 ENCOUNTER — Inpatient Hospital Stay (HOSPITAL_COMMUNITY): Payer: Medicare Other | Attending: Hematology

## 2020-01-03 ENCOUNTER — Encounter (HOSPITAL_COMMUNITY): Payer: Self-pay

## 2020-01-03 VITALS — BP 148/74 | HR 65 | Temp 96.8°F | Resp 18

## 2020-01-03 DIAGNOSIS — C7951 Secondary malignant neoplasm of bone: Secondary | ICD-10-CM | POA: Diagnosis not present

## 2020-01-03 DIAGNOSIS — C61 Malignant neoplasm of prostate: Secondary | ICD-10-CM | POA: Diagnosis present

## 2020-01-03 DIAGNOSIS — Z5111 Encounter for antineoplastic chemotherapy: Secondary | ICD-10-CM | POA: Diagnosis not present

## 2020-01-03 LAB — COMPREHENSIVE METABOLIC PANEL
ALT: 24 U/L (ref 0–44)
AST: 25 U/L (ref 15–41)
Albumin: 4.5 g/dL (ref 3.5–5.0)
Alkaline Phosphatase: 81 U/L (ref 38–126)
Anion gap: 9 (ref 5–15)
BUN: 16 mg/dL (ref 8–23)
CO2: 25 mmol/L (ref 22–32)
Calcium: 9.1 mg/dL (ref 8.9–10.3)
Chloride: 104 mmol/L (ref 98–111)
Creatinine, Ser: 0.65 mg/dL (ref 0.61–1.24)
GFR, Estimated: >60 mL/min (ref >60)
Glucose, Bld: 107 mg/dL — ABNORMAL HIGH (ref 70–99)
Potassium: 4.8 mmol/L (ref 3.5–5.1)
Sodium: 138 mmol/L (ref 135–145)
Total Bilirubin: 0.4 mg/dL (ref 0.3–1.2)
Total Protein: 7.6 g/dL (ref 6.5–8.1)

## 2020-01-03 LAB — CBC WITH DIFFERENTIAL/PLATELET
Abs Immature Granulocytes: 0.02 10*3/uL (ref 0.00–0.07)
Basophils Absolute: 0.1 10*3/uL (ref 0.0–0.1)
Basophils Relative: 1 %
Eosinophils Absolute: 0.4 10*3/uL (ref 0.0–0.5)
Eosinophils Relative: 6 %
HCT: 49.5 % (ref 39.0–52.0)
Hemoglobin: 15.7 g/dL (ref 13.0–17.0)
Immature Granulocytes: 0 %
Lymphocytes Relative: 26 %
Lymphs Abs: 1.9 10*3/uL (ref 0.7–4.0)
MCH: 30 pg (ref 26.0–34.0)
MCHC: 31.7 g/dL (ref 30.0–36.0)
MCV: 94.5 fL (ref 80.0–100.0)
Monocytes Absolute: 0.7 10*3/uL (ref 0.1–1.0)
Monocytes Relative: 10 %
Neutro Abs: 4.2 10*3/uL (ref 1.7–7.7)
Neutrophils Relative %: 57 %
Platelets: 258 10*3/uL (ref 150–400)
RBC: 5.24 MIL/uL (ref 4.22–5.81)
RDW: 13.4 % (ref 11.5–15.5)
WBC: 7.4 10*3/uL (ref 4.0–10.5)
nRBC: 0 % (ref 0.0–0.2)

## 2020-01-03 MED ORDER — LEUPROLIDE ACETATE (6 MONTH) 45 MG ~~LOC~~ KIT
45.0000 mg | PACK | Freq: Once | SUBCUTANEOUS | Status: AC
  Administered 2020-01-03: 45 mg via SUBCUTANEOUS
  Filled 2020-01-03: qty 45

## 2020-01-03 MED ORDER — DENOSUMAB 120 MG/1.7ML ~~LOC~~ SOLN
120.0000 mg | Freq: Once | SUBCUTANEOUS | Status: AC
  Administered 2020-01-03: 120 mg via SUBCUTANEOUS

## 2020-01-03 NOTE — Progress Notes (Signed)
Keith Olson. tolerated Xgeva and Eligard injections well without complaints or incident. Calcium 9.1 today and pt denied any tooth or jaw pain and no recent or future dental visits prior to administering the Xgeva injection. VSS. Pt discharged self ambulatory using his cane in satisfactory condition accompanied by family member

## 2020-01-03 NOTE — Patient Instructions (Signed)
[place] at [facility] Discharge Instructions  Received Keith Olson and Eligard injections today. Follow-up as scheduled   Thank you for choosing [place] at [facility] to provide your oncology and hematology care.  To afford each patient quality time with our provider, please arrive at least 15 minutes before your scheduled appointment time.   If you have a lab appointment with the [place] please come in thru the Main Entrance and check in at the main information desk.  You need to re-schedule your appointment should you arrive 10 or more minutes late.  We strive to give you quality time with our providers, and arriving late affects you and other patients whose appointments are after yours.  Also, if you no show three or more times for appointments you may be dismissed from the clinic at the providers discretion.     Again, thank you for choosing [facility].  Our hope is that these requests will decrease the amount of time that you wait before being seen by our physicians.       _____________________________________________________________  Should you have questions after your visit to [facility] please contact our office at [phone] and follow the prompts.  Our office hours are 8:00 a.m. and 4:30 p.m. Monday - Friday.  Please note that voicemails left after 4:00 p.m. may not be returned until the following business day.  We are closed weekends and major holidays.  You do have access to a nurse 24-7, just call the main number to the clinic [phone] and do not press any options, hold on the line and a nurse will answer the phone.    For prescription refill requests, have your pharmacy contact our office and allow 72 hours.    Due to Covid, you will need to wear a mask upon entering the hospital. If you do not have a mask, a mask will be given to you at the Main Entrance upon arrival. For doctor visits,  patients may have 1 support person age 77 or older with them. For treatment visits, patients can not have anyone with them due to social distancing guidelines and our immunocompromised population.

## 2020-01-04 MED ORDER — PALONOSETRON HCL INJECTION 0.25 MG/5ML
INTRAVENOUS | Status: AC
  Filled 2020-01-04: qty 5

## 2020-01-06 DIAGNOSIS — R918 Other nonspecific abnormal finding of lung field: Secondary | ICD-10-CM | POA: Diagnosis not present

## 2020-01-06 DIAGNOSIS — M5440 Lumbago with sciatica, unspecified side: Secondary | ICD-10-CM | POA: Diagnosis not present

## 2020-01-07 ENCOUNTER — Other Ambulatory Visit (HOSPITAL_COMMUNITY): Payer: Self-pay | Admitting: Surgery

## 2020-01-07 DIAGNOSIS — C61 Malignant neoplasm of prostate: Secondary | ICD-10-CM

## 2020-01-07 MED ORDER — FENTANYL 25 MCG/HR TD PT72: 1.0000 | MEDICATED_PATCH | TRANSDERMAL | 0 refills | Status: DC

## 2020-01-10 MED FILL — ABIRATERONE ACETATE 250 MG: 250 | 30 days supply | Qty: 120 | Fill #1

## 2020-01-11 ENCOUNTER — Telehealth (HOSPITAL_COMMUNITY): Payer: Self-pay

## 2020-01-11 NOTE — Telephone Encounter (Signed)
Nutrition Assessment   Reason for Assessment: Patient identified on Malnutrition Screening report for poor appetite   ASSESSMENT:  79 year old male with prostate cancer with mets to bone.  Past medical history of CAD, HLD, hypothyroidism.  Patient receiving lupron, zytiga.    Spoke with patient via phone and introduced self and service at [facility].  Patient reports that he has no appetite and does not feel hungry.  Has to force self to eat.  Reports this am ate egg with mayo sandwich.  Sometimes will get a biscuit for breakfast.  Lives alone as wife in skilled nursing facility.  Patient does not cook.  Usually goes to nearby restaurant around 3pm and gets homemade soup.  Drinks 3-4 ensure per day. Tries to buy plus but can't always find it.  Bowel move normally. No issues with nausea.  Has not gotten marinol.  Likes ice cream and chocolate milk.  Sometimes eats peanut butter and jelly sandwich.      Medications: reviewed   Labs: reviewed   Anthropometrics:   Height: 67.5 inches Weight: 155 lb 14.4 oz on 10/7 152 lb 9/21 149 lb 7/28 BMI: 24  Weight gain noted.    Estimated Energy Needs  Kcals: 2100-2450 Protein: 105-122 g Fluid: 2.1 L   NUTRITION DIAGNOSIS: Inadequate oral intake related to cancer related treatment side effects as evidenced by no appetite   INTERVENTION:  Encouraged 350 calorie shake to provide 1050-1400 calories and 39-52 g/d. Will mail coupons Discussed ways to add calories and protein. Will mail handout.   Contact information mailed to patient.     MONITORING, EVALUATION, GOAL: weight trends, intake   Next Visit: Dec 10 phone call  Rosalia Mcavoy B. Zenia Resides, [place], [place] Registered Dietitian [phone] (mobile)

## 2020-01-21 DIAGNOSIS — J029 Acute pharyngitis, unspecified: Secondary | ICD-10-CM | POA: Diagnosis not present

## 2020-01-22 ENCOUNTER — Other Ambulatory Visit (HOSPITAL_COMMUNITY): Payer: Self-pay

## 2020-01-22 DIAGNOSIS — C61 Malignant neoplasm of prostate: Secondary | ICD-10-CM

## 2020-01-22 MED ORDER — HYDROMORPHONE HCL 4 MG PO TABS: 4.0000 mg | ORAL_TABLET | Freq: Two times a day (BID) | ORAL | 0 refills | Status: DC | PRN

## 2020-01-31 ENCOUNTER — Inpatient Hospital Stay (HOSPITAL_COMMUNITY): Payer: Medicare Other | Attending: Hematology

## 2020-01-31 ENCOUNTER — Other Ambulatory Visit (HOSPITAL_COMMUNITY): Payer: Self-pay | Admitting: *Deleted

## 2020-01-31 ENCOUNTER — Other Ambulatory Visit: Payer: Self-pay

## 2020-01-31 ENCOUNTER — Inpatient Hospital Stay (HOSPITAL_COMMUNITY): Payer: Medicare Other

## 2020-01-31 ENCOUNTER — Inpatient Hospital Stay (HOSPITAL_BASED_OUTPATIENT_CLINIC_OR_DEPARTMENT_OTHER): Payer: Medicare Other | Admitting: Hematology

## 2020-01-31 VITALS — BP 174/70 | HR 60 | Temp 97.9°F | Resp 18 | Wt 163.0 lb

## 2020-01-31 DIAGNOSIS — C61 Malignant neoplasm of prostate: Secondary | ICD-10-CM

## 2020-01-31 DIAGNOSIS — I10 Essential (primary) hypertension: Secondary | ICD-10-CM | POA: Diagnosis not present

## 2020-01-31 DIAGNOSIS — C7951 Secondary malignant neoplasm of bone: Secondary | ICD-10-CM | POA: Diagnosis not present

## 2020-01-31 DIAGNOSIS — E039 Hypothyroidism, unspecified: Secondary | ICD-10-CM | POA: Insufficient documentation

## 2020-01-31 DIAGNOSIS — K5900 Constipation, unspecified: Secondary | ICD-10-CM | POA: Insufficient documentation

## 2020-01-31 LAB — CBC WITH DIFFERENTIAL/PLATELET
Abs Immature Granulocytes: 0.02 10*3/uL (ref 0.00–0.07)
Basophils Absolute: 0.1 10*3/uL (ref 0.0–0.1)
Basophils Relative: 1 %
Eosinophils Absolute: 0.5 10*3/uL (ref 0.0–0.5)
Eosinophils Relative: 6 %
HCT: 41.9 % (ref 39.0–52.0)
Hemoglobin: 13.7 g/dL (ref 13.0–17.0)
Immature Granulocytes: 0 %
Lymphocytes Relative: 13 %
Lymphs Abs: 1.1 10*3/uL (ref 0.7–4.0)
MCH: 30.2 pg (ref 26.0–34.0)
MCHC: 32.7 g/dL (ref 30.0–36.0)
MCV: 92.5 fL (ref 80.0–100.0)
Monocytes Absolute: 0.7 10*3/uL (ref 0.1–1.0)
Monocytes Relative: 8 %
Neutro Abs: 6 10*3/uL (ref 1.7–7.7)
Neutrophils Relative %: 72 %
Platelets: 220 10*3/uL (ref 150–400)
RBC: 4.53 MIL/uL (ref 4.22–5.81)
RDW: 13.8 % (ref 11.5–15.5)
WBC: 8.3 10*3/uL (ref 4.0–10.5)
nRBC: 0 % (ref 0.0–0.2)

## 2020-01-31 LAB — COMPREHENSIVE METABOLIC PANEL
ALT: 22 U/L (ref 0–44)
AST: 24 U/L (ref 15–41)
Albumin: 3.9 g/dL (ref 3.5–5.0)
Alkaline Phosphatase: 48 U/L (ref 38–126)
Anion gap: 9 (ref 5–15)
BUN: 13 mg/dL (ref 8–23)
CO2: 29 mmol/L (ref 22–32)
Calcium: 8.9 mg/dL (ref 8.9–10.3)
Chloride: 100 mmol/L (ref 98–111)
Creatinine, Ser: 0.58 mg/dL — ABNORMAL LOW (ref 0.61–1.24)
GFR, Estimated: >60 mL/min (ref >60)
Glucose, Bld: 99 mg/dL (ref 70–99)
Potassium: 3.9 mmol/L (ref 3.5–5.1)
Sodium: 138 mmol/L (ref 135–145)
Total Bilirubin: 0.5 mg/dL (ref 0.3–1.2)
Total Protein: 6.6 g/dL (ref 6.5–8.1)

## 2020-01-31 LAB — PSA: Prostatic Specific Antigen: 0.02 ng/mL (ref 0.00–4.00)

## 2020-01-31 MED ORDER — FENTANYL 25 MCG/HR TD PT72: 1.0000 | MEDICATED_PATCH | TRANSDERMAL | 0 refills | Status: DC

## 2020-01-31 MED ORDER — LISINOPRIL 10 MG PO TABS: 10.0000 mg | ORAL_TABLET | Freq: Every day | ORAL | 2 refills | Status: DC

## 2020-01-31 MED ORDER — DENOSUMAB 120 MG/1.7ML ~~LOC~~ SOLN
120.0000 mg | Freq: Once | SUBCUTANEOUS | Status: AC
  Administered 2020-01-31: 120 mg via SUBCUTANEOUS
  Filled 2020-01-31: qty 1.7

## 2020-01-31 NOTE — Progress Notes (Signed)
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    CANCER STAGING: Cancer Staging No matching staging information was found for the patient.  INTERVAL HISTORY:  [name], a 79 y.o. male, returns for routine follow-up of his metastatic castration sensitive prostate cancer. [name] was last seen on 12/06/2019.   Today he is accompanied by his son and he reports feeling okay. He took [facility] this AM for a headache. He reports having "hot flashes" running down his legs alternating legs every 2 to 3 hours. He reports having 1 to 2 tablets of Dilaudid for his leg pain PRN. He reports having "hot flashes" going down He denies having any N/V. He is taking Zytiga 1,000 mg every morning at 4 AM and tolerating it well. He has not been taking Marinol since his appetite is excellent. He stays active throughout the day.    REVIEW OF SYSTEMS:  Review of Systems  Constitutional: Positive for fatigue (50%). Negative for appetite change.  Cardiovascular: Positive for leg swelling (& heat running down each leg).  Gastrointestinal: Negative for nausea and vomiting.  Neurological: Positive for headaches.  All other systems reviewed and are negative.   PAST MEDICAL/SURGICAL HISTORY:  Past Medical History:  Diagnosis Date  . Agent orange exposure   . Arthritis   . Coronary artery disease    BMS to mid LAD 2001, DES to proximal LAD 2017  . Enlarged prostate    XRT 2016  . Headache    Sinus headaches   . Heart abnormality   . History of depression   .  Hyperlipidemia   . Hypothyroidism   . Prostate cancer ([place]) 07/2014   hormonal therapy, external beam radiation therapy  . PTSD (post-traumatic stress disorder)    Past Surgical History:  Procedure Laterality Date  . BRONCHIAL BRUSHINGS  07/02/2019   Procedure: BRONCHIAL BRUSHINGS;  Surgeon: Rigoberto Noel, MD;  Location: [facility];  Service: Cardiopulmonary;;  . BRONCHIAL NEEDLE ASPIRATION BIOPSY  07/02/2019   Procedure: BRONCHIAL NEEDLE ASPIRATION BIOPSIES;  Surgeon: Rigoberto Noel, MD;  [facility]: [facility];  Service: Cardiopulmonary;;  . BRONCHIAL WASHINGS  07/02/2019   Procedure: BRONCHIAL WASHINGS;  Surgeon: Rigoberto Noel, MD;  [facility];  Service: Cardiopulmonary;;  . CARDIAC CATHETERIZATION N/A 12/12/2015   Procedure: Left Heart Cath and Coronary Angiography;  Surgeon: Peter M Martinique, MD;  Location: [place];  Service: Cardiovascular;  Laterality: N/A;  . CARDIAC CATHETERIZATION N/A 12/12/2015   Procedure: Coronary Stent Intervention;  Surgeon: Peter M Martinique, MD;  Location: [place] [facility];  Service: Cardiovascular;  Laterality: N/A;  . CORONARY STENT INTERVENTION N/A 09/06/2017   Procedure: CORONARY STENT INTERVENTION;  Surgeon: Burnell Blanks, MD;  Location: [facility];  Service: Cardiovascular;  Laterality: N/A;  . ENDOBRONCHIAL ULTRASOUND N/A 07/02/2019   Procedure: ENDOBRONCHIAL ULTRASOUND;  Surgeon: Rigoberto Noel, MD;  [facility];  Service: Cardiopulmonary;  Laterality: N/A;  . FLEXIBLE BRONCHOSCOPY  07/02/2019   Procedure: FLEXIBLE BRONCHOSCOPY;  Surgeon: Rigoberto Noel, MD;  Location: [facility];  Service: Cardiopulmonary;;  . HERNIA REPAIR Right   . LEFT HEART CATH AND CORONARY ANGIOGRAPHY N/A 09/02/2016   Procedure: Left Heart Cath and Coronary Angiography;  Surgeon: Leonie Man, MD;  Location: [place] [facility];  Service: Cardiovascular;  Laterality: N/A;  . LEFT HEART CATH AND CORONARY ANGIOGRAPHY N/A 09/06/2017    Procedure: LEFT HEART CATH AND CORONARY ANGIOGRAPHY;  Surgeon: Burnell Blanks, MD;  Location: [facility];  Service: Cardiovascular;  Laterality: N/A;  . LEFT HEART CATH AND CORONARY ANGIOGRAPHY N/A 05/04/2019   Procedure: LEFT HEART CATH AND CORONARY ANGIOGRAPHY;  Surgeon: Martinique, Peter M, MD;  Location: [facility];  Service: Cardiovascular;  Laterality: N/A;  . PROSTATE BIOPSY N/A 08/28/2014   Procedure: BIOPSY TRANSRECTAL ULTRASONIC PROSTATE (TUBP);  Surgeon: Rana Snare, MD;  [facility];  Service: Urology;  Laterality: N/A;  . TRANSURETHRAL RESECTION OF PROSTATE N/A 08/28/2014   Procedure: TRANSURETHRAL RESECTION OF THE PROSTATE WITH GYRUS INSTRUMENTS;  Surgeon: Rana Snare, MD;  [facility]: [facility];  Service: Urology;  Laterality: N/A;    SOCIAL HISTORY:  Social History   Socioeconomic History  . Marital status: Married    Spouse name: Not on file  . Number of children: 5  . Years of education: Not on file  . Highest education level: Not on file  Occupational History  . Occupation: retired  Tobacco Use  . Smoking status: Never Smoker  . Smokeless tobacco: Former Systems developer    Types: Secondary school teacher  . Vaping Use: Never used  Substance and Sexual Activity  . Alcohol use: No  . Drug use: No  . Sexual activity: Yes  Other Topics Concern  . Not on file  Social History Narrative   Married   5 children   Social Determinants of Health   Financial Resource Strain: Low Risk   . Difficulty of Paying Living Expenses: Not hard at all  Food Insecurity: No Food Insecurity  . Worried About Charity fundraiser in the Last Year: Never true  . Ran Out of Food in the Last Year: Never true  Transportation Needs: No Transportation Needs  . Lack of Transportation (Medical): No  . Lack of Transportation (Non-Medical): No  Physical Activity: Inactive  . Days of Exercise per Week: 0 days  . Minutes of Exercise per Session: 0 min  Stress: No Stress Concern Present  .  Feeling of Stress : Not at all  Social Connections: Moderately Isolated  . Frequency of Communication with Friends and Family: More than three times a week  . Frequency of Social Gatherings with Friends and Family: More than three times a week  . Attends Religious Services: Never  . Active Member of Clubs or Organizations: No  . Attends Archivist Meetings: Never  . Marital Status: Married  Human resources officer Violence: Not At Risk  . Fear of Current or Ex-Partner: No  . Emotionally Abused: No  . Physically Abused: No  . Sexually Abused: No    FAMILY HISTORY:  Family History  Problem Relation Age of Onset  . Arthritis Mother   . Heart attack Father     CURRENT MEDICATIONS:  Current Outpatient Medications  Medication Sig Dispense Refill  . abiraterone acetate (ZYTIGA) 250 MG tablet TAKE 4 TABLETS (1,000 MG TOTAL) BY MOUTH DAILY. TAKE ON AN EMPTY STOMACH 1 HOUR BEFORE OR 2 HOURS AFTER A MEAL 120 tablet 0  . abiraterone acetate (ZYTIGA) 250 MG tablet TAKE 4  TABLETS (1,000 MG TOTAL) BY MOUTH DAILY. TAKE ON AN EMPTY STOMACH 1 HOUR BEFORE OR 2 HOURS AFTER A MEAL 120 tablet 6  . aspirin EC 81 MG tablet Take 1 tablet (81 mg total) by mouth daily.    . cetirizine (ZYRTEC) 10 MG tablet Take 10 mg by mouth daily.    Marland Kitchen dexamethasone (DECADRON) 4 MG tablet Take 1 tablet (4 mg total) by mouth 2 (two) times daily with a meal. 60 tablet 1  . diclofenac sodium (VOLTAREN) 1 % GEL APPLY 2 GRAMS TOPICALLY 4 TIMES DAILY 100 g 5  . dronabinol (MARINOL) 2.5 MG capsule Take 1 capsule (2.5 mg total) by mouth 2 (two) times daily before a meal. 60 capsule 2  . DULoxetine (CYMBALTA) 60 MG capsule Take 60 mg by mouth daily.    . fentaNYL (DURAGESIC) 25 MCG/HR Place 1 patch onto the skin every 3 (three) days. 10 patch 0  . fluticasone (FLONASE) 50 MCG/ACT nasal spray Place into both nostrils.    . folic acid (FOLVITE) 1 MG tablet Take 1 tablet (1 mg total) by mouth daily.    . furosemide (LASIX) 20 MG  tablet Take 20 mg by mouth 2 (two) times daily.     Marland Kitchen gabapentin (NEURONTIN) 400 MG capsule Take 1 capsule (400 mg total) by mouth 3 (three) times daily. 90 capsule 3  . meclizine (ANTIVERT) 25 MG tablet Take 1 tablet (25 mg total) by mouth 2 (two) times daily as needed for dizziness. 30 tablet 0  . NARCAN 4 MG/0.1ML LIQD nasal spray kit CALL 911. ADMINISTER A SINGLE SPRAY OF NARCAN IN ONE NOSTRIL. REPEAT EVERY 3 MINUTES AS NEEDED IF NO OR MINIMAL RESPONSE.    . nitroGLYCERIN (NITROSTAT) 0.4 MG SL tablet Place 1 tablet (0.4 mg total) under the tongue every 5 (five) minutes as needed for chest pain. X 3 doses 25 tablet 12  . pantoprazole (PROTONIX) 40 MG tablet Take 1 tablet (40 mg total) by mouth daily. 30 tablet 3  . polyethylene glycol powder (GLYCOLAX/MIRALAX) 17 GM/SCOOP powder Take 17 g by mouth daily as needed for mild constipation or moderate constipation.     . predniSONE (DELTASONE) 5 MG tablet Take 1 tablet (5 mg total) by mouth daily with breakfast. 30 tablet 6  . tamsulosin (FLOMAX) 0.4 MG CAPS capsule Take 0.4 mg by mouth daily.    Marland Kitchen thiamine 100 MG tablet Take 1 tablet (100 mg total) by mouth daily.    Marland Kitchen ALPRAZolam (XANAX) 0.5 MG tablet Take 1 tablet (0.5 mg total) by mouth 2 (two) times daily as needed for anxiety. (Patient not taking: Reported on 01/31/2020) 10 tablet 0  . HYDROmorphone (DILAUDID) 4 MG tablet Take 1 tablet (4 mg total) by mouth every 12 (twelve) hours as needed for severe pain. (Patient not taking: Reported on 01/31/2020) 60 tablet 0  . lisinopril (PRINIVIL) 10 MG tablet Take 1 tablet (10 mg total) by mouth daily. 30 tablet 2   No current facility-administered medications for this visit.    ALLERGIES:  Allergies  Allergen Reactions  . Lipitor [Atorvastatin]     Muscles aches    PHYSICAL EXAM:  Performance status (ECOG): 1 - Symptomatic but completely ambulatory  Vitals:   01/31/20 1108  BP: (!) 174/70  Pulse: 60  Resp: 18  Temp: 97.9 F (36.6 C)  SpO2:  100%   Wt Readings from Last 3 Encounters:  01/31/20 163 lb (73.9 kg)  12/06/19 155 lb 14.4 oz (70.7 kg)  11/01/19 152 lb 1.6 oz (69 kg)   Physical Exam Vitals reviewed.  Constitutional:      Appearance: Normal appearance.  Cardiovascular:     Rate and Rhythm: Normal rate and regular rhythm.     Pulses: Normal pulses.     Heart sounds: Normal heart sounds.  Pulmonary:     Effort: Pulmonary effort is normal.     Breath sounds: Normal breath sounds.  Musculoskeletal:     Right lower leg: Swelling (R ankle > L ankle) present.  Neurological:     General: No focal deficit present.     Mental Status: He is alert and oriented to person, place, and time.  Psychiatric:        Mood and Affect: Mood normal.        Behavior: Behavior normal.      LABORATORY DATA:  I have reviewed the labs as listed.  CBC Latest Ref Rng & Units 01/31/2020 01/03/2020 12/06/2019  WBC 4.0 - 10.5 K/uL 8.3 7.4 8.0  Hemoglobin 13.0 - 17.0 g/dL 13.7 15.7 13.5  Hematocrit 39 - 52 % 41.9 49.5 41.4  Platelets 150 - 400 K/uL 220 258 234   CMP Latest Ref Rng & Units 01/31/2020 01/03/2020 12/06/2019  Glucose 70 - 99 mg/dL 99 107(H) 116(H)  BUN 8 - 23 mg/dL _0 Creatinine 0.61 - 1.24 mg/dL 0.58(L) 0.65 0.53(L)  Sodium 135 - 145 mmol/L 138 138 134(L)  Potassium 3.5 - 5.1 mmol/L 3.9 4.8 4.4  Chloride 98 - 111 mmol/L 100 104 99  CO2 22 - 32 mmol/L _1 Calcium 8.9 - 10.3 mg/dL 8.9 9.1 9.1  Total Protein 6.5 - 8.1 g/dL 6.6 7.6 6.9  Total Bilirubin 0.3 - 1.2 mg/dL 0.5 0.4 0.3  Alkaline Phos 38 - 126 U/L 48 81 91  AST 15 - 41 U/L _2 ALT 0 - 44 U/L _3 DIAGNOSTIC IMAGING:  I have independently reviewed the scans and discussed with the patient. No results found.   ASSESSMENT:  1. Metastatic castration sensitive prostate cancer: -History of prostate cancer diagnosed 08/28/2014, Gleason 4+4=8, status post XRT and seed implants. -Presentation with low back pain of several months, PSA on  06/19/2019 was 239, testosterone 210, alkaline phosphatase 306. -MRI of the cervical, thoracic and lumbar spine on 07/27/2019 shows diffuse bone metastatic disease. Mild to moderate compression deformity of T3 with mild ventral epidural extension. No cord compression. 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 -CT angio of the chest, abdomen and pelvis on 06/27/2019 shows multiple lung nodules bilaterally, largest in the left upper lobe measuring 2.5 x 1.6 x 1.5 cm with associated left hilar and AP window lymphadenopathy. -Navigational bronchoscopy and biopsy on 07/02/2019 with upper lobe brushing and washing shows atypical cells. 10 L FNA also shows atypical cells. -1 pack/day for 4-5 years, quit 60 years ago. Positive agent orange exposure -Lupron 45 mg on 07/12/2019 along with 1 month ofCasodex. -PET scan on 08/11/2019 shows mildly hypermetabolic widespread sclerotic osseous metastasis throughout the axial and proximal appendicular skeleton, increased sclerosis since 06/27/2018 on CT angiogram. This could represent response to therapy. No hypermetabolic lymphadenopathy. Left mediastinal and left hilar adenopathy is seen on 06/19/2019 has resolved. Bilateral pulmonary nodules are stable to decreased in size. Dominant left upper lobe lung nodule has decreased from 2.5 cm to 1.7 cm. -Abiraterone was started around 08/29/2019.  2. Low back pain: -10 treatments of radiation therapy completed on 07/11/2019.   PLAN:  1. Metastatic castration sensitive prostate cancer: -He is taking Abiraterone 4 tablets on empty stomach along with prednisone 5 mg daily. -Reviewed his LFTs which are within normal limits.  CBC was normal. -PSA continued to improved to 0.02. -Reports severe hot flashes.  Recommended trying black cohosh. -RTC 2 months with labs.  If black cohosh does not work, will consider Megace 40 mg twice  daily.  2. Low back pain: -Continue fentanyl 25 mcg patch.  He is taking Dilaudid 4 mg 1 to 2 tablets/day as needed.  3.  Loss of appetite: -His appetite has improved even without Marinol.  Continued boost/Ensure to 3 cans/day.  4. Constipation: -Continue stool softeners.  5. Bone mets: -Continue denosumab monthly.  Continue calcium and vitamin D.  6.  Hypertension: -Today systolic blood pressure is 174.  We will start him on lisinopril 10 mg daily.   Orders placed this encounter:  No orders of the defined types were placed in this encounter.    Derek Jack, MD [place] [phone]   I, Milinda Antis, am acting as a scribe for Dr. Sanda Linger.  I, Derek Jack MD, have reviewed the above documentation for accuracy and completeness, and I agree with the above.

## 2020-01-31 NOTE — Progress Notes (Signed)
Patient was assessed by Dr. Delton Coombes and labs have been reviewed.  Patient is okay to proceed with injection today. Primary RN and pharmacy aware.

## 2020-01-31 NOTE — Patient Instructions (Signed)
[place] at [facility] Discharge Instructions  You were seen today by Dr. Delton Coombes. He went over your recent results. You received your Xgeva bone injection today; continue getting it every month. Purchase black cohosh over the counter and take 1 tablet daily to alleviate the hot flashes. Continue taking prednisone every morning with breakfast. You will be prescribed lisinopril 10 mg to take daily for your high blood pressure. Dr. Delton Coombes will see you back in 2 months for labs and follow up.   Thank you for choosing [place] at [facility] to provide your oncology and hematology care.  To afford each patient quality time with our provider, please arrive at least 15 minutes before your scheduled appointment time.   If you have a lab appointment with the [facility] please come in thru the Main Entrance and check in at the main information desk  You need to re-schedule your appointment should you arrive 10 or more minutes late.  We strive to give you quality time with our providers, and arriving late affects you and other patients whose appointments are after yours.  Also, if you no show three or more times for appointments you may be dismissed from the clinic at the providers discretion.     Again, thank you for choosing [facility].  Our hope is that these requests will decrease the amount of time that you wait before being seen by our physicians.       _____________________________________________________________  Should you have questions after your visit to [facility], please contact our office at [phone] between the hours of 8:00 a.m. and 4:30 p.m.  Voicemails left after 4:00 p.m. will not be returned until the following business day.  For prescription refill requests, have your pharmacy contact our office and allow 72 hours.    Cancer Center Support Programs:   > Cancer Support Group  2nd Tuesday of  the month 1pm-2pm, Journey Room

## 2020-01-31 NOTE — Progress Notes (Signed)
.  Keith Toniann Fail. presents today for injection per the provider's orders. Xgeva administrated without incident; injection site WNL; see MAR for injection details.  Patient tolerated procedure well and without incident.  No questions or complaints noted at this time.Pt denied any tooth or jaw pain and no recent or future dental appointments. Pt self ambulatory using cane and accompanied by family member.

## 2020-02-05 DIAGNOSIS — R918 Other nonspecific abnormal finding of lung field: Secondary | ICD-10-CM | POA: Diagnosis not present

## 2020-02-05 DIAGNOSIS — M5440 Lumbago with sciatica, unspecified side: Secondary | ICD-10-CM | POA: Diagnosis not present

## 2020-02-06 ENCOUNTER — Telehealth (HOSPITAL_COMMUNITY): Payer: Self-pay

## 2020-02-06 NOTE — Telephone Encounter (Signed)
Received call from patient stating that he still has not received his Fentanyl patches.  Stated he requested a refill at his last appointment on 01/31/20.  This nurse contacted pharmacy and was advised that the prescription has been received however they were waiting on a delivery so that the script can be filled.  Patient has been made aware of what the pharmacy stated and has no further questions or concerns at this time.

## 2020-02-07 ENCOUNTER — Telehealth: Payer: Self-pay | Admitting: Oncology

## 2020-02-07 DIAGNOSIS — C61 Malignant neoplasm of prostate: Secondary | ICD-10-CM

## 2020-02-07 DIAGNOSIS — C7951 Secondary malignant neoplasm of bone: Secondary | ICD-10-CM

## 2020-02-07 MED ORDER — HYDROMORPHONE HCL 4 MG PO TABS: 4.0000 mg | ORAL_TABLET | Freq: Three times a day (TID) | ORAL | 0 refills | Status: DC | PRN

## 2020-02-07 MED FILL — predniSONE 5 MG TABS: 5 | 30 days supply | Qty: 30 | Fill #5

## 2020-02-07 MED FILL — ABIRATERONE ACETATE 250 MG: 250 | 30 days supply | Qty: 120 | Fill #2

## 2020-02-07 NOTE — Telephone Encounter (Signed)
Re: Pain  Mr. Ricke called clinic today requesting a change to pain medicine.  Pain is in his joint, elbow, back knees, shoulders-this is all chronic  He noticed worsening over the past few weeks.  He has been taking his Dilaudid every 12 hours around-the-clock.  Patient states yesterday pain was so bad he could barely get out of bed.  We will increase his Dilaudid to 3 times a day and will repeat imaging.  We will get PET scan scheduled ASAP.  Faythe Casa, NP 02/07/2020 4:09 PM

## 2020-02-08 ENCOUNTER — Ambulatory Visit (HOSPITAL_COMMUNITY): Payer: Medicare Other

## 2020-02-08 DIAGNOSIS — R531 Weakness: Secondary | ICD-10-CM | POA: Diagnosis not present

## 2020-02-08 DIAGNOSIS — R52 Pain, unspecified: Secondary | ICD-10-CM | POA: Diagnosis not present

## 2020-02-08 DIAGNOSIS — R509 Fever, unspecified: Secondary | ICD-10-CM | POA: Diagnosis not present

## 2020-02-08 DIAGNOSIS — G459 Transient cerebral ischemic attack, unspecified: Secondary | ICD-10-CM | POA: Diagnosis not present

## 2020-02-08 NOTE — Progress Notes (Signed)
Nutrition  Called patient for scheduled nutrition phone visit.  Spoke with son who stated that EMS was in the home checking him out at this time.  RD did not keep son on phone.  Will follow-up at later date. Medical team informed  Keith Olson, [place], [place] Registered Dietitian [phone] (mobile)

## 2020-02-11 ENCOUNTER — Other Ambulatory Visit (HOSPITAL_COMMUNITY): Payer: Self-pay | Admitting: *Deleted

## 2020-02-12 ENCOUNTER — Telehealth (HOSPITAL_COMMUNITY): Payer: Self-pay

## 2020-02-12 ENCOUNTER — Other Ambulatory Visit: Payer: Self-pay | Admitting: Hematology

## 2020-02-12 ENCOUNTER — Other Ambulatory Visit (HOSPITAL_COMMUNITY): Payer: Self-pay

## 2020-02-12 DIAGNOSIS — C61 Malignant neoplasm of prostate: Secondary | ICD-10-CM

## 2020-02-12 MED ORDER — FENTANYL 25 MCG/HR TD PT72: 1.0000 | MEDICATED_PATCH | TRANSDERMAL | 0 refills | Status: DC

## 2020-02-12 NOTE — Telephone Encounter (Signed)
This nurse returned call to patient concerning his Fentanyl patches.  Patient called stated that he was unable to get his patches from [facility].  Stated he was told that the patches were on back order until January.  Patient also stated he contacted [facility] and was informed that they had the patches available and wanted to know if the script could be sent there.  MD signed script and sent in to [facility].  The pharmacy acknowledged receipt.  Patient has been made aware via voicemail message.

## 2020-02-13 NOTE — Telephone Encounter (Signed)
Accessed to verify orders only.

## 2020-02-19 ENCOUNTER — Other Ambulatory Visit (HOSPITAL_COMMUNITY): Payer: Self-pay

## 2020-02-19 ENCOUNTER — Ambulatory Visit (HOSPITAL_COMMUNITY): Payer: Medicare Other

## 2020-02-19 DIAGNOSIS — C61 Malignant neoplasm of prostate: Secondary | ICD-10-CM

## 2020-02-19 DIAGNOSIS — C7951 Secondary malignant neoplasm of bone: Secondary | ICD-10-CM

## 2020-02-19 MED ORDER — HYDROMORPHONE HCL 4 MG PO TABS: 4.0000 mg | ORAL_TABLET | Freq: Three times a day (TID) | ORAL | 0 refills | Status: DC | PRN

## 2020-02-20 ENCOUNTER — Telehealth (HOSPITAL_COMMUNITY): Payer: Self-pay

## 2020-02-20 NOTE — Telephone Encounter (Signed)
Left message for patient that his prescription for Dilaudid was sent in to the [facility] in [place] on 02/19/20 and is available for pick up.

## 2020-02-21 ENCOUNTER — Telehealth (HOSPITAL_COMMUNITY): Payer: Self-pay

## 2020-02-21 NOTE — Telephone Encounter (Signed)
Patient called  and left message today concerning refill of Dilaudid.  This nurse called and spoke with son, [name], informed him that the Dilaudid has been ready for pickup at [facility] since 02/19/20.  Son stated he will let patient know to pick up medication.  No further questions or concerns at this time.

## 2020-02-28 ENCOUNTER — Inpatient Hospital Stay (HOSPITAL_COMMUNITY): Payer: Medicare Other

## 2020-02-28 ENCOUNTER — Other Ambulatory Visit: Payer: Self-pay

## 2020-02-28 VITALS — BP 132/78 | HR 71 | Temp 96.8°F | Resp 18

## 2020-02-28 DIAGNOSIS — E039 Hypothyroidism, unspecified: Secondary | ICD-10-CM | POA: Diagnosis not present

## 2020-02-28 DIAGNOSIS — C7951 Secondary malignant neoplasm of bone: Secondary | ICD-10-CM | POA: Diagnosis not present

## 2020-02-28 DIAGNOSIS — K5900 Constipation, unspecified: Secondary | ICD-10-CM | POA: Diagnosis not present

## 2020-02-28 DIAGNOSIS — C61 Malignant neoplasm of prostate: Secondary | ICD-10-CM

## 2020-02-28 DIAGNOSIS — I10 Essential (primary) hypertension: Secondary | ICD-10-CM | POA: Diagnosis not present

## 2020-02-28 LAB — COMPREHENSIVE METABOLIC PANEL
ALT: 19 U/L (ref 0–44)
AST: 19 U/L (ref 15–41)
Albumin: 4 g/dL (ref 3.5–5.0)
Alkaline Phosphatase: 67 U/L (ref 38–126)
Anion gap: 10 (ref 5–15)
BUN: 19 mg/dL (ref 8–23)
CO2: 24 mmol/L (ref 22–32)
Calcium: 9.3 mg/dL (ref 8.9–10.3)
Chloride: 101 mmol/L (ref 98–111)
Creatinine, Ser: 0.64 mg/dL (ref 0.61–1.24)
GFR, Estimated: >60 mL/min (ref >60)
Glucose, Bld: 105 mg/dL — ABNORMAL HIGH (ref 70–99)
Potassium: 4.4 mmol/L (ref 3.5–5.1)
Sodium: 135 mmol/L (ref 135–145)
Total Bilirubin: 0.4 mg/dL (ref 0.3–1.2)
Total Protein: 7.3 g/dL (ref 6.5–8.1)

## 2020-02-28 MED ORDER — OCTREOTIDE ACETATE 30 MG IM KIT
PACK | INTRAMUSCULAR | Status: AC
  Filled 2020-02-28: qty 1

## 2020-02-28 MED ORDER — DENOSUMAB 120 MG/1.7ML ~~LOC~~ SOLN
120.0000 mg | Freq: Once | SUBCUTANEOUS | Status: AC
  Administered 2020-02-28: 120 mg via SUBCUTANEOUS
  Filled 2020-02-28: qty 1.7

## 2020-02-28 NOTE — Progress Notes (Signed)
Patient tolerated Xgeva injection with no complaints voiced.  Site clean and dry with no bruising or swelling noted.  No complaints of pain.  Discharged with vital signs stable and no signs or symptoms of distress noted.

## 2020-03-03 ENCOUNTER — Encounter (HOSPITAL_COMMUNITY)
Admission: RE | Admit: 2020-03-03 | Discharge: 2020-03-03 | Disposition: A | Discharge status: Home / Self Care | Payer: Medicare Other | Source: Ambulatory Visit | Attending: Oncology | Admitting: Oncology

## 2020-03-03 ENCOUNTER — Other Ambulatory Visit: Payer: Self-pay

## 2020-03-03 DIAGNOSIS — C61 Malignant neoplasm of prostate: Secondary | ICD-10-CM | POA: Diagnosis present

## 2020-03-03 DIAGNOSIS — K449 Diaphragmatic hernia without obstruction or gangrene: Secondary | ICD-10-CM | POA: Insufficient documentation

## 2020-03-03 DIAGNOSIS — I700 Atherosclerosis of aorta: Secondary | ICD-10-CM | POA: Insufficient documentation

## 2020-03-03 DIAGNOSIS — K5730 Diverticulosis of large intestine without perforation or abscess without bleeding: Secondary | ICD-10-CM | POA: Insufficient documentation

## 2020-03-03 DIAGNOSIS — I2510 Atherosclerotic heart disease of native coronary artery without angina pectoris: Secondary | ICD-10-CM | POA: Diagnosis not present

## 2020-03-03 DIAGNOSIS — R918 Other nonspecific abnormal finding of lung field: Secondary | ICD-10-CM | POA: Diagnosis not present

## 2020-03-03 DIAGNOSIS — C7951 Secondary malignant neoplasm of bone: Secondary | ICD-10-CM | POA: Insufficient documentation

## 2020-03-03 LAB — GLUCOSE, CAPILLARY: Glucose-Capillary: 93 mg/dL (ref 70–99)

## 2020-03-03 IMAGING — PT NM PET TUM IMG RESTAG (PS) SKULL BASE T - THIGH
7 of 8 series · 19 of 25 positions shown · non-contrast
Comparison: [DATE] PET-CT.

CLINICAL DATA: Subsequent treatment strategy for metastatic
prostate cancer with worsening bone pain. History of palliative
radiotherapy to L4 vertebral metastasis.

EXAM:
NUCLEAR MEDICINE PET SKULL BASE TO THIGH
TECHNIQUE: 8.0 mCi F-18 FDG was injected intravenously. Full-ring PET imaging
was performed from the skull base to thigh after the radiotracer. CT
data was obtained and used for attenuation correction and anatomic
localization.
Fasting blood glucose: 93 mg/dl

[Series 3: pet sk_thigh ac · axial · 5.0mm · 4.07mm/px · z∈[-905,-21]mm · 4 of 222 slices shown]
[im 1/222]
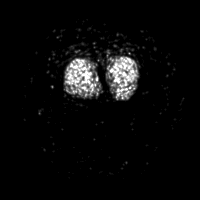
[im 56/222]
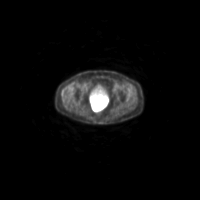
[im 166/222]
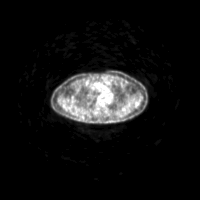
[im 222/222]
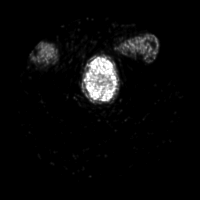

[Series 4: ct sk_thigh 5.0 bf37 · axial · 5.0mm · 0.88mm/px · z∈[-905,-21]mm · 4 of 222 slices shown]
[im 1/222]
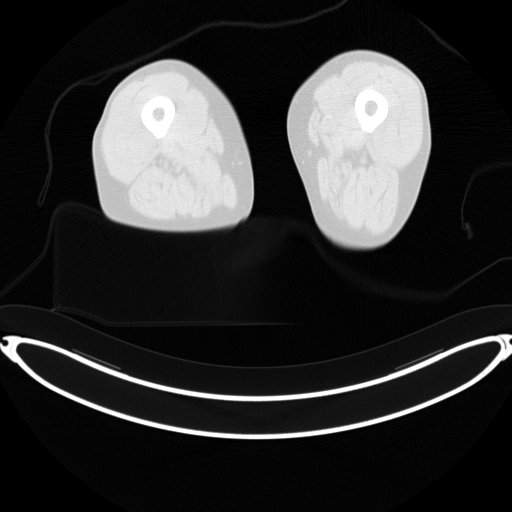
[im 111/222]
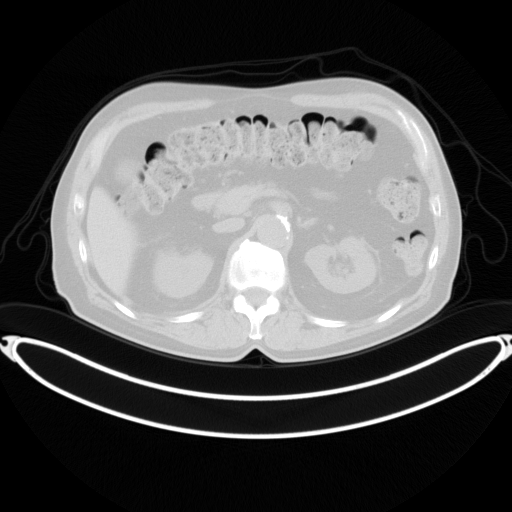
[im 166/222]
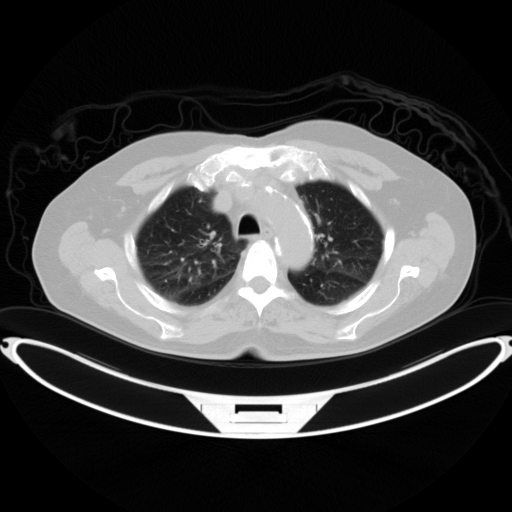
[im 222/222  brain]
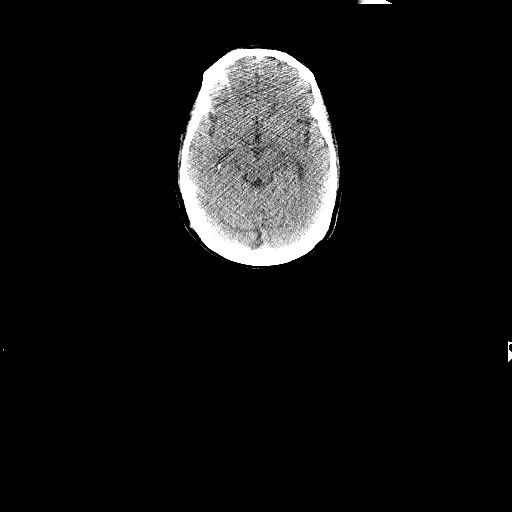

[Series 5: pet sk_thigh nac · axial · 5.0mm · 4.07mm/px · z∈[-685,-245]mm · 3 of 222 slices shown]
[im 56/222]
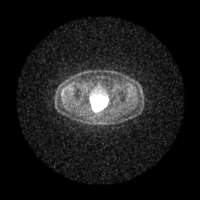
[im 111/222]
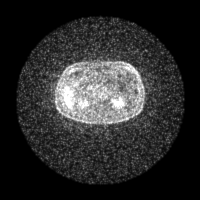
[im 166/222]
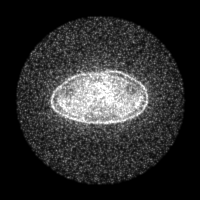

[Series 8: ct sk_thigh 5.0 br59 lung_bone · axial · 5.0mm · 0.63mm/px · z∈[-421,-177]mm · 2 of 62 slices shown]
[im 1/62]
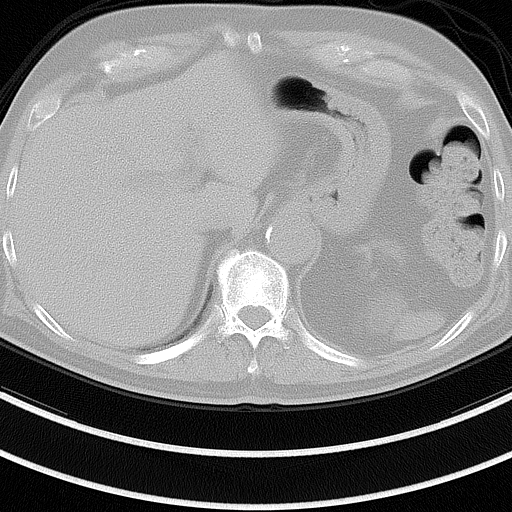
[im 62/62]
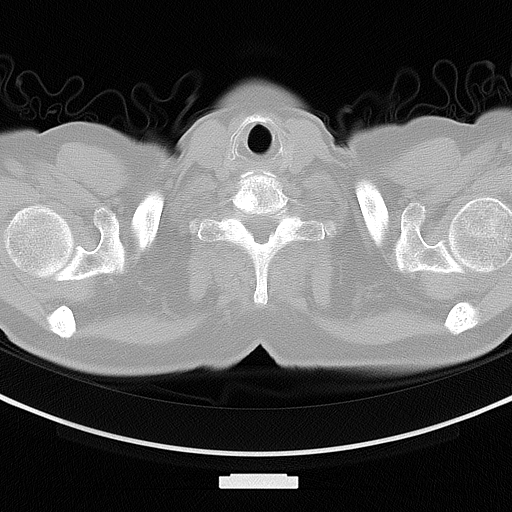

[Series 603: fused cor · 1 of 49 slices shown]
[im 1/49]
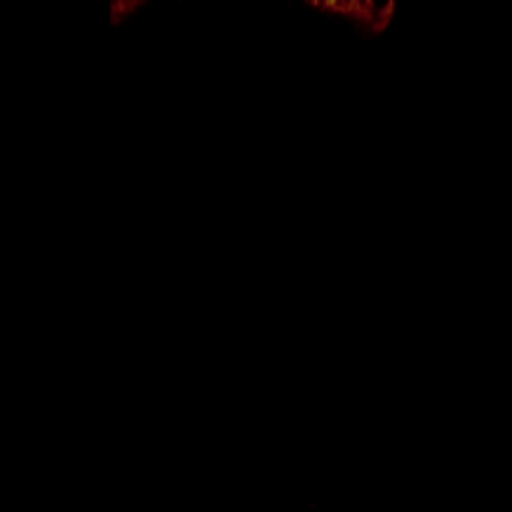

[Series 605: range-ct sk_thigh 5.0 bf37-tra-<alpha range> · 4 of 211 slices shown]
[im 1/211]
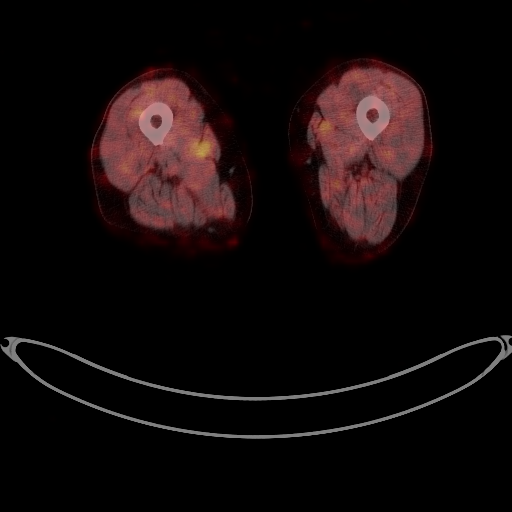
[im 53/211]
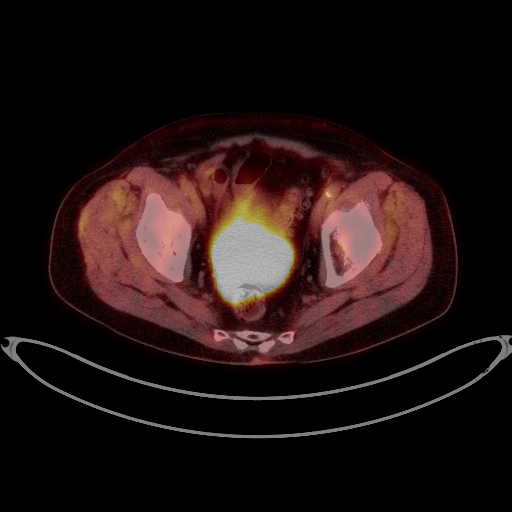
[im 106/211]
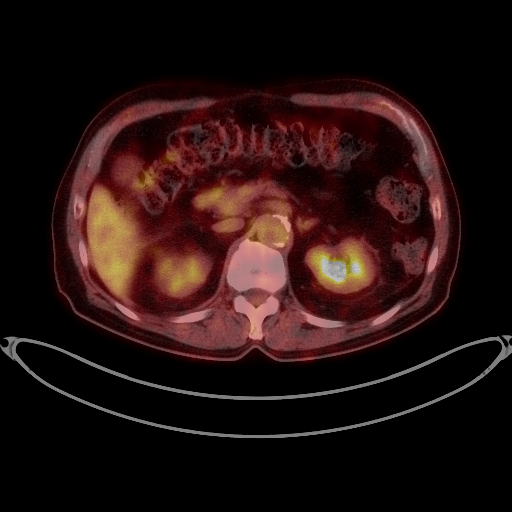
[im 211/211]
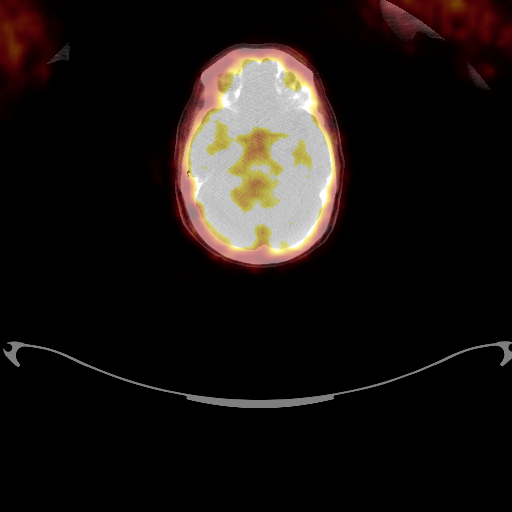

[Series 1196: results mm oncology reading · 3.0mm · 0.50mm/px · 1 of 6 slices shown]
[im 1/6]
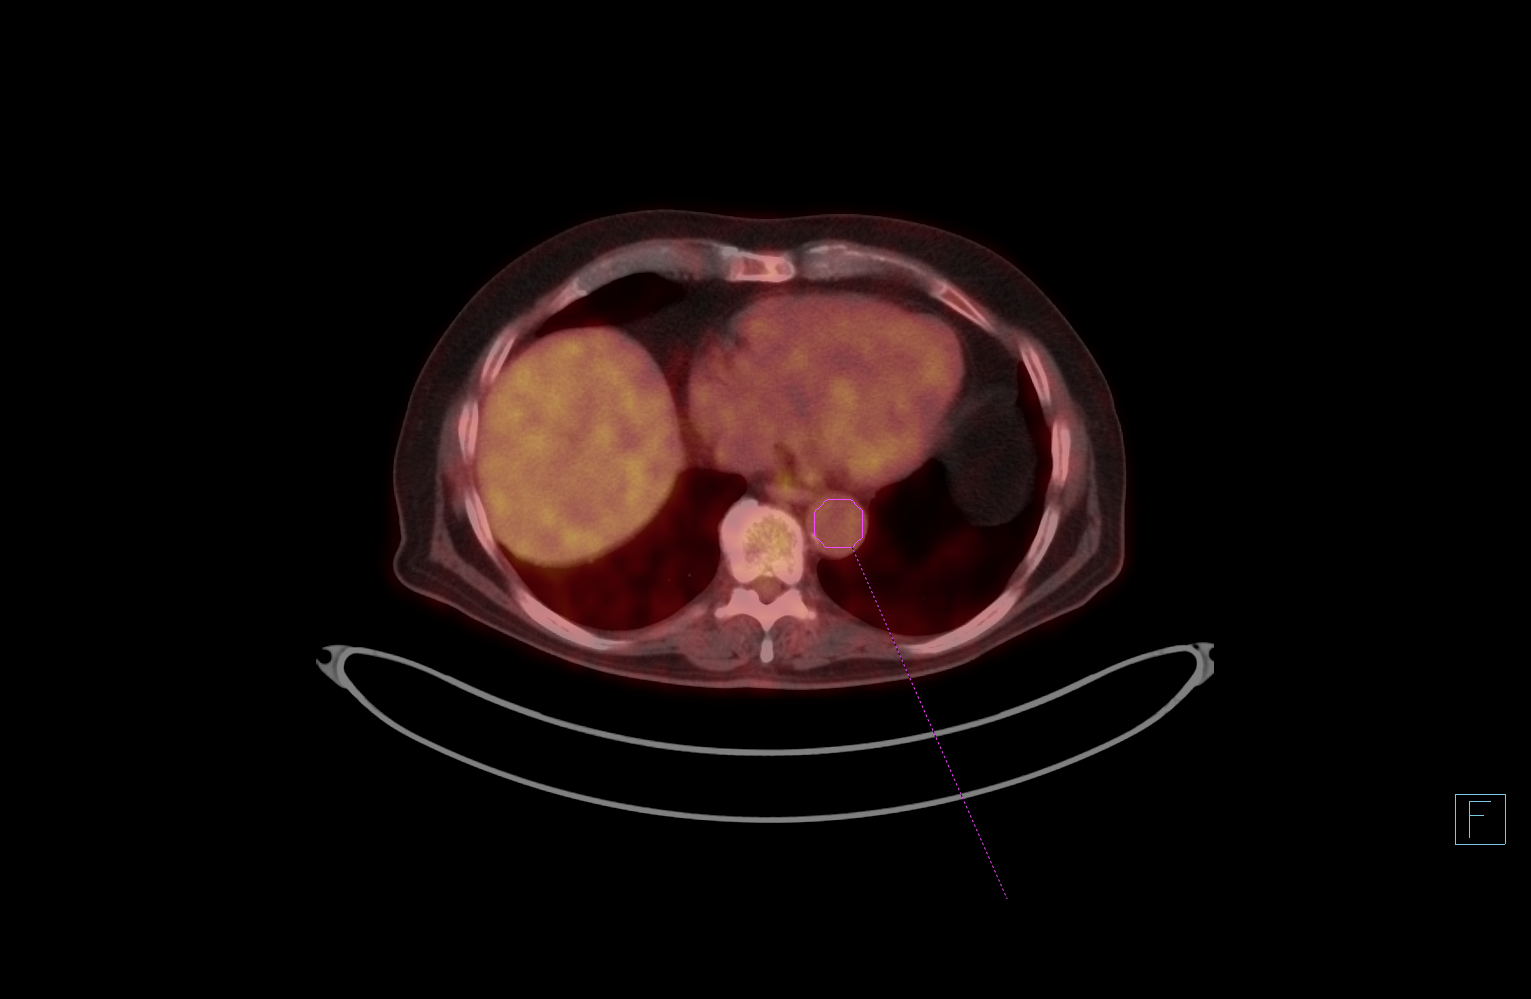

[19 of 25 positions shown; findings below may reference images not displayed]

FINDINGS: Mediastinal blood pool activity: SUV max

Liver activity: SUV max NA

NECK: No hypermetabolic lymph nodes in the neck.

Incidental CT findings: Mild mucoperiosteal thickening in the
dependent maxillary sinuses bilaterally. No paranasal sinus fluid
levels.

CHEST:

Newly hypermetabolic mildly enlarged 1.0 cm right subcarinal node
with max SUV 4.1 (series 4/image 73). No additional enlarged or
hypermetabolic mediastinal nodes. No hypermetabolic hilar or
axillary nodes. No hypermetabolic pulmonary findings.

Incidental CT findings: Coronary atherosclerosis. Atherosclerotic
thoracic aorta with stable ectatic 4.2 cm ascending thoracic aorta.
No acute consolidative airspace disease or significant pulmonary
nodules. Previously visualized scattered pulmonary nodules are not
appreciated on today's scan.

ABDOMEN/PELVIS: No abnormal hypermetabolic activity within the
liver, pancreas, adrenal glands, or spleen. No hypermetabolic lymph
nodes in the abdomen or pelvis.

Incidental CT findings: Small hiatal hernia. Subcentimeter hypodense
lateral segment left liver lesion is too small to characterize and
is unchanged, considered benign. Moderate diffuse colonic stool.
Moderate sigmoid diverticulosis. Atrophic prostate with associated
fiducial markers. Atherosclerotic abdominal aorta with stable
ectatic 2.6 cm infrarenal abdominal aorta.

SKELETON: Widespread patchy confluent sclerotic osseous lesions
throughout the axial and proximal appendicular skeleton, appearing
stable in distribution and increased in sclerosis throughout. There
is patchy mild hypermetabolism throughout the axial and proximal
appendicular skeleton, which appears new in a few locations, for
example in the inferior right sacral with max SUV 3.7 and in the
proximal right humeral metaphysis with max SUV 6.4, and which
appears minimally increased in the spine in some locations, for
example in the T12 vertebral body with max SUV 4.3, previous max SUV
3.7.

Incidental CT findings: none
IMPRESSION: 1. Newly mildly hypermetabolic mildly enlarged right subcarinal
lymph node, cannot exclude new nodal metastasis.
2. Widespread patchy confluent sclerotic osseous metastases,
increased in sclerosis and stable in distribution. Patchy mild
hypermetabolism throughout the axial and proximal appendicular
skeleton, which appears new in a few locations and minimally
increased in the spine in some locations, as detailed, suggesting
mild progression of metabolically active skeletal metastases.
3. Previously visualized pulmonary nodules appear absent on today's
low-dose CT images.
4. Chronic findings include: Aortic Atherosclerosis ([JV]-[JV]).
Coronary atherosclerosis. Stable ectatic 4.2 cm ascending thoracic
aorta and ectatic 2.6 cm infrarenal abdominal aorta. Small hiatal
hernia. Moderate sigmoid diverticulosis.

## 2020-03-03 MED ORDER — FLUDEOXYGLUCOSE F - 18 (FDG) INJECTION
8.0000 | Freq: Once | INTRAVENOUS | Status: AC | PRN
  Administered 2020-03-03: 8 via INTRAVENOUS

## 2020-03-03 NOTE — Progress Notes (Signed)
He is not seeing you until the end of month but we got PET scan b/c he was having worsening bone and joint pain requiring several changes to his pain meds. Do you want me to bring him in sooner or wait for the end of the month for results of his PET?  Faythe Casa, NP 03/03/2020 3:42 PM

## 2020-03-05 NOTE — Progress Notes (Signed)
Hey Amy,   Can you see if we can get him scheduled to see me for either virtual or in person visit to figure out his pain.   Sonia Baller

## 2020-03-05 NOTE — Progress Notes (Signed)
Yes sir. Thank you!  Faythe Casa, NP 03/05/2020 12:52 PM

## 2020-03-07 DIAGNOSIS — R918 Other nonspecific abnormal finding of lung field: Secondary | ICD-10-CM | POA: Diagnosis not present

## 2020-03-07 DIAGNOSIS — M5440 Lumbago with sciatica, unspecified side: Secondary | ICD-10-CM | POA: Diagnosis not present

## 2020-03-10 MED FILL — ABIRATERONE ACETATE 250 MG: 250 | 30 days supply | Qty: 120 | Fill #3

## 2020-03-10 MED FILL — predniSONE 5 MG TABS: 5 | 30 days supply | Qty: 30 | Fill #6

## 2020-03-11 ENCOUNTER — Encounter (HOSPITAL_COMMUNITY): Payer: Self-pay | Admitting: Oncology

## 2020-03-11 ENCOUNTER — Other Ambulatory Visit: Payer: Self-pay

## 2020-03-11 ENCOUNTER — Inpatient Hospital Stay (HOSPITAL_COMMUNITY): Payer: Medicare Other | Attending: Hematology | Admitting: Oncology

## 2020-03-11 ENCOUNTER — Other Ambulatory Visit (HOSPITAL_COMMUNITY): Payer: Self-pay | Admitting: Surgery

## 2020-03-11 DIAGNOSIS — C7951 Secondary malignant neoplasm of bone: Secondary | ICD-10-CM | POA: Diagnosis not present

## 2020-03-11 DIAGNOSIS — E039 Hypothyroidism, unspecified: Secondary | ICD-10-CM | POA: Diagnosis not present

## 2020-03-11 DIAGNOSIS — K5900 Constipation, unspecified: Secondary | ICD-10-CM | POA: Diagnosis not present

## 2020-03-11 DIAGNOSIS — I10 Essential (primary) hypertension: Secondary | ICD-10-CM | POA: Insufficient documentation

## 2020-03-11 DIAGNOSIS — C61 Malignant neoplasm of prostate: Secondary | ICD-10-CM

## 2020-03-11 DIAGNOSIS — R630 Anorexia: Secondary | ICD-10-CM | POA: Insufficient documentation

## 2020-03-11 MED ORDER — FENTANYL 25 MCG/HR TD PT72: 1.0000 | MEDICATED_PATCH | TRANSDERMAL | 0 refills | Status: DC

## 2020-03-11 NOTE — Progress Notes (Signed)
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    CANCER STAGING: Cancer Staging No matching staging information was found for the patient.  INTERVAL HISTORY:  Mr. Hoy Fallert., a 80 y.o. male, returns for routine follow-up of his metastatic castration sensitive prostate cancer. Demetry was last seen on 01/31/20.  At that visit he was doing well but endorsed hot flashes.  His pain was controlled with Dilaudid.  Most of his pain was in his legs.  He was tolerating Zytiga well.  Patient called clinic on 02/07/2020 to report worsening pain in his joints, elbow, back of his knees and shoulders.  States this had acutely worsened over the past few weeks.  A prescription was sent in for 25 mcg fentanyl patch every 72 hours.  PET scan was ordered to rule out progressive disease.  PET scan from 03/03/2020 showed fairly stable disease.  Reviewed with Dr. Delton Coombes who was not overly impressed with his imaging results.  Given lab work shows a nearly undetectable PSA (0.2) he is likely to favor against progression of disease.  He presents back today to discuss PET scan and pain management.  He reports his pain has completely resolved since we spoke in December.  He is using his Dilaudid twice a day and applying for every 3 days as instructed.  He denies any uncontrolled pain at this time.  He denies any concerns with his Zytiga.  His appetite is great.  He uses a cane for ambulation.   REVIEW OF SYSTEMS:  Review of Systems  Endocrine: Positive for hot flashes.   Musculoskeletal: Positive for arthralgias.    PAST MEDICAL/SURGICAL HISTORY:  Past Medical History:  Diagnosis Date  . Agent orange exposure   . Arthritis   . Coronary artery disease    BMS to mid LAD 2001, DES to proximal LAD 2017  . Enlarged prostate    XRT 2016  . Headache    Sinus headaches   . Heart abnormality   . History of depression   . Hyperlipidemia   . Hypothyroidism   . Prostate cancer ([place]) 07/2014   hormonal therapy, external beam radiation therapy  . PTSD (post-traumatic stress disorder)    Past Surgical History:  Procedure Laterality Date  . BRONCHIAL BRUSHINGS  07/02/2019   Procedure: BRONCHIAL BRUSHINGS;  Surgeon: Rigoberto Noel, MD;  Location: [facility];  Service: Cardiopulmonary;;  . BRONCHIAL NEEDLE ASPIRATION BIOPSY  07/02/2019   Procedure: BRONCHIAL NEEDLE ASPIRATION BIOPSIES;  Surgeon: Rigoberto Noel, MD;  Location: [facility];  Service: Cardiopulmonary;;  . BRONCHIAL WASHINGS  07/02/2019   Procedure: BRONCHIAL WASHINGS;  Surgeon: Rigoberto Noel, MD;  Location: [facility];  Service: Cardiopulmonary;;  . CARDIAC CATHETERIZATION N/A 12/12/2015   Procedure: Left Heart Cath and Coronary Angiography;  Surgeon: Peter M Martinique, MD;  Location: [facility];  Service: Cardiovascular;  Laterality: N/A;  . CARDIAC CATHETERIZATION N/A 12/12/2015   Procedure: Coronary Stent Intervention;  Surgeon: Peter M Martinique, MD;  Location: [facility];  Service: Cardiovascular;  Laterality: N/A;  . CORONARY  STENT INTERVENTION N/A 09/06/2017   Procedure: CORONARY STENT INTERVENTION;  Surgeon: Burnell Blanks, MD;  Location: [facility] [facility];  Service: Cardiovascular;  Laterality: N/A;  . ENDOBRONCHIAL ULTRASOUND N/A 07/02/2019   Procedure: ENDOBRONCHIAL ULTRASOUND;  Surgeon: Rigoberto Noel, MD;  [facility]: [facility];  Service: Cardiopulmonary;  Laterality: N/A;  . FLEXIBLE BRONCHOSCOPY  07/02/2019   Procedure: FLEXIBLE BRONCHOSCOPY;  Surgeon: Rigoberto Noel, MD;  [facility];  Service: Cardiopulmonary;;  . HERNIA REPAIR Right   . LEFT HEART CATH AND CORONARY ANGIOGRAPHY N/A 09/02/2016   Procedure: Left Heart Cath and Coronary Angiography;  Surgeon: Leonie Man, MD;  Location: [facility];  Service: Cardiovascular;  Laterality: N/A;  . LEFT HEART CATH AND CORONARY ANGIOGRAPHY N/A 09/06/2017   Procedure: LEFT HEART CATH AND CORONARY ANGIOGRAPHY;  Surgeon: Burnell Blanks, MD;  Location: [facility];  Service: Cardiovascular;  Laterality: N/A;  . LEFT HEART CATH AND CORONARY ANGIOGRAPHY N/A 05/04/2019   Procedure: LEFT HEART CATH AND CORONARY ANGIOGRAPHY;  Surgeon: Martinique, Peter M, MD;  Location: [facility];  Service: Cardiovascular;  Laterality: N/A;  . PROSTATE BIOPSY N/A 08/28/2014   Procedure: BIOPSY TRANSRECTAL ULTRASONIC PROSTATE (TUBP);  Surgeon: Rana Snare, MD;  Location: [facility];  Service: Urology;  Laterality: N/A;  . TRANSURETHRAL RESECTION OF PROSTATE N/A 08/28/2014   Procedure: TRANSURETHRAL RESECTION OF THE PROSTATE WITH GYRUS INSTRUMENTS;  Surgeon: Rana Snare, MD;  Location: [facility];  Service: Urology;  Laterality: N/A;    SOCIAL HISTORY:  Social History   Socioeconomic History  . Marital status: Married    Spouse name: Not on file  . Number of children: 5  . Years of education: Not on file  . Highest education level: Not on file  Occupational History  . Occupation: retired  Tobacco Use  . Smoking status: Never Smoker  . Smokeless tobacco: Former Systems developer    Types: Secondary school teacher  . Vaping Use: Never used  Substance and Sexual Activity  . Alcohol use: No  . Drug use: No  . Sexual activity: Yes  Other Topics Concern  . Not on file  Social History Narrative   Married   5 children   Social Determinants of Health   Financial Resource Strain: Low Risk   . Difficulty of Paying Living Expenses: Not hard at all  Food Insecurity: No Food Insecurity  . Worried About Sales executive in the Last Year: Never true  . Ran Out of Food in the Last Year: Never true  Transportation Needs: No Transportation Needs  . Lack of Transportation (Medical): No  . Lack of Transportation (Non-Medical): No  Physical Activity: Inactive  . Days of Exercise per Week: 0 days  . Minutes of Exercise per Session: 0 min  Stress: No Stress Concern Present  . Feeling of Stress : Not at all  Social Connections: Moderately Isolated  . Frequency of Communication with Friends and Family: More than three times a week  . Frequency of Social Gatherings with Friends and Family: More than three times a week  . Attends Religious Services: Never  . Active Member of Clubs or Organizations: No  . Attends Archivist Meetings: Never  . Marital Status: Married  Human resources officer Violence: Not At Risk  . Fear of Current or Ex-Partner: No  . Emotionally Abused: No  . Physically Abused: No  . Sexually Abused: No    FAMILY HISTORY:  Family History  Problem Relation Age of Onset  . Arthritis Mother   . Heart attack Father     CURRENT MEDICATIONS:  Current Outpatient Medications  Medication Sig Dispense Refill  . abiraterone acetate (ZYTIGA) 250 MG tablet TAKE 4 TABLETS (1,000 MG TOTAL) BY MOUTH DAILY. TAKE ON AN EMPTY STOMACH 1 HOUR BEFORE OR 2 HOURS AFTER A MEAL 120 tablet 0  . abiraterone acetate (ZYTIGA) 250 MG tablet TAKE 4 TABLETS (1,000 MG TOTAL) BY MOUTH DAILY. TAKE ON AN EMPTY STOMACH 1 HOUR BEFORE OR 2 HOURS AFTER A MEAL 120 tablet 6  . ALPRAZolam (XANAX) 0.5 MG tablet Take 1 tablet (0.5 mg total) by mouth 2 (two) times daily as needed for anxiety. (Patient not taking: Reported on 01/31/2020) 10 tablet 0  . aspirin EC 81 MG tablet Take 1 tablet (81 mg total) by mouth daily.    . cetirizine (ZYRTEC) 10 MG tablet Take 10 mg by mouth daily.    Marland Kitchen dexamethasone (DECADRON) 4 MG tablet Take 1 tablet (4 mg total) by mouth 2 (two) times daily with a meal. 60 tablet 1  . diclofenac sodium  (VOLTAREN) 1 % GEL APPLY 2 GRAMS TOPICALLY 4 TIMES DAILY 100 g 5  . dronabinol (MARINOL) 2.5 MG capsule Take 1 capsule (2.5 mg total) by mouth 2 (two) times daily before a meal. 60 capsule 2  . DULoxetine (CYMBALTA) 60 MG capsule Take 60 mg by mouth daily.    . fentaNYL (DURAGESIC) 25 MCG/HR Place 1 patch onto the skin every 3 (three) days. 10 patch 0  . fluticasone (FLONASE) 50 MCG/ACT nasal spray Place into both nostrils.    . folic acid (FOLVITE) 1 MG tablet Take 1 tablet (1 mg total) by mouth daily.    . furosemide (LASIX) 20 MG tablet Take 20 mg by mouth 2 (two) times daily.     Marland Kitchen gabapentin (NEURONTIN) 400 MG capsule Take 1 capsule (400 mg total) by mouth 3 (three) times daily. 90 capsule 3  . HYDROmorphone (DILAUDID) 4 MG tablet Take 1 tablet (4 mg total) by mouth every 8 (eight) hours as needed for severe pain. 90 tablet 0  . lisinopril (PRINIVIL) 10 MG tablet Take 1 tablet (10 mg total) by mouth daily. 30 tablet 2  . meclizine (ANTIVERT) 25 MG tablet Take 1 tablet (25 mg total) by mouth 2 (two) times daily as needed for dizziness. 30 tablet 0  . NARCAN 4 MG/0.1ML LIQD nasal spray kit CALL 911. ADMINISTER A SINGLE SPRAY OF NARCAN IN ONE NOSTRIL. REPEAT EVERY 3 MINUTES AS NEEDED IF NO OR MINIMAL RESPONSE.    . nitroGLYCERIN (NITROSTAT) 0.4 MG SL tablet Place 1 tablet (0.4 mg total) under the tongue every 5 (five) minutes as needed for chest pain. X 3 doses 25 tablet 12  . pantoprazole (PROTONIX) 40 MG tablet Take 1 tablet (40 mg total) by mouth daily. 30 tablet 3  . polyethylene glycol powder (GLYCOLAX/MIRALAX) 17 GM/SCOOP powder Take 17 g by mouth daily as needed for mild constipation or moderate constipation.     . predniSONE (DELTASONE) 5 MG tablet Take 1 tablet (5 mg total) by mouth daily with breakfast. 30 tablet 6  . tamsulosin (FLOMAX) 0.4 MG CAPS capsule Take 0.4 mg by mouth daily. (Patient not taking: Reported on 03/11/2020)    . thiamine 100 MG tablet Take 1 tablet (100 mg total) by  mouth daily.     No current facility-administered medications for this visit.    ALLERGIES:  Allergies  Allergen  Reactions  . Lipitor [Atorvastatin]     Muscles aches    PHYSICAL EXAM:  Performance status (ECOG): 1 - Symptomatic but completely ambulatory  Vitals:   03/11/20 1010 03/11/20 1034  BP: (!) 174/77 130/71  Pulse: 66   Resp: 18   Temp: (!) 96.8 F (36 C)   SpO2: 95%    Wt Readings from Last 3 Encounters:  03/11/20 158 lb 4.6 oz (71.8 kg)  01/31/20 163 lb (73.9 kg)  12/06/19 155 lb 14.4 oz (70.7 kg)   Physical Exam Vitals reviewed.  Constitutional:      Appearance: Normal appearance.  Cardiovascular:     Rate and Rhythm: Normal rate and regular rhythm.     Pulses: Normal pulses.     Heart sounds: Normal heart sounds.  Pulmonary:     Effort: Pulmonary effort is normal.     Breath sounds: Normal breath sounds.  Musculoskeletal:     Right lower leg: Swelling (R ankle > L ankle) present.  Neurological:     General: No focal deficit present.     Mental Status: He is alert and oriented to person, place, and time.  Psychiatric:        Mood and Affect: Mood normal.        Behavior: Behavior normal.      LABORATORY DATA:  I have reviewed the labs as listed.  CBC Latest Ref Rng & Units 01/31/2020 01/03/2020 12/06/2019  WBC 4.0 - 10.5 K/uL 8.3 7.4 8.0  Hemoglobin 13.0 - 17.0 g/dL 13.7 15.7 13.5  Hematocrit 39.0 - 52.0 % 41.9 49.5 41.4  Platelets 150 - 400 K/uL 220 258 234   CMP Latest Ref Rng & Units 02/28/2020 01/31/2020 01/03/2020  Glucose 70 - 99 mg/dL 105(H) 99 107(H)  BUN 8 - 23 mg/dL _0 Creatinine 0.61 - 1.24 mg/dL 0.64 0.58(L) 0.65  Sodium 135 - 145 mmol/L 135 138 138  Potassium 3.5 - 5.1 mmol/L 4.4 3.9 4.8  Chloride 98 - 111 mmol/L 101 100 104  CO2 22 - 32 mmol/L _1 Calcium 8.9 - 10.3 mg/dL 9.3 8.9 9.1  Total Protein 6.5 - 8.1 g/dL 7.3 6.6 7.6  Total Bilirubin 0.3 - 1.2 mg/dL 0.4 0.5 0.4  Alkaline Phos 38 - 126 U/L 67 48 81  AST 15  - 41 U/L _2 ALT 0 - 44 U/L _3 DIAGNOSTIC IMAGING:  I have independently reviewed the scans and discussed with the patient. NM PET Image Restag (PS) Skull Base To Thigh  Result Date: 03/03/2020 CLINICAL DATA:  Subsequent treatment strategy for metastatic prostate cancer with worsening bone pain. History of palliative radiotherapy to L4 vertebral metastasis. EXAM: NUCLEAR MEDICINE PET SKULL BASE TO THIGH TECHNIQUE: 8.0 mCi F-18 FDG was injected intravenously. Full-ring PET imaging was performed from the skull base to thigh after the radiotracer. CT data was obtained and used for attenuation correction and anatomic localization. Fasting blood glucose: 93 mg/dl COMPARISON:  08/10/2019 PET-CT. FINDINGS: Mediastinal blood pool activity: SUV max 3.0 Liver activity: SUV max NA NECK: No hypermetabolic lymph nodes in the neck. Incidental CT findings: Mild mucoperiosteal thickening in the dependent maxillary sinuses bilaterally. No paranasal sinus fluid levels. CHEST: Newly hypermetabolic mildly enlarged 1.0 cm right subcarinal node with max SUV 4.1 (series 4/image 73). No additional enlarged or hypermetabolic mediastinal nodes. No hypermetabolic hilar or axillary nodes. No hypermetabolic pulmonary findings. Incidental CT findings: Coronary atherosclerosis. Atherosclerotic thoracic aorta with stable  ectatic 4.2 cm ascending thoracic aorta. No acute consolidative airspace disease or significant pulmonary nodules. Previously visualized scattered pulmonary nodules are not appreciated on today's scan. ABDOMEN/PELVIS: No abnormal hypermetabolic activity within the liver, pancreas, adrenal glands, or spleen. No hypermetabolic lymph nodes in the abdomen or pelvis. Incidental CT findings: Small hiatal hernia. Subcentimeter hypodense lateral segment left liver lesion is too small to characterize and is unchanged, considered benign. Moderate diffuse colonic stool. Moderate sigmoid diverticulosis. Atrophic prostate  with associated fiducial markers. Atherosclerotic abdominal aorta with stable ectatic 2.6 cm infrarenal abdominal aorta. SKELETON: Widespread patchy confluent sclerotic osseous lesions throughout the axial and proximal appendicular skeleton, appearing stable in distribution and increased in sclerosis throughout. There is patchy mild hypermetabolism throughout the axial and proximal appendicular skeleton, which appears new in a few locations, for example in the inferior right sacral with max SUV 3.7 and in the proximal right humeral metaphysis with max SUV 6.4, and which appears minimally increased in the spine in some locations, for example in the T12 vertebral body with max SUV 4.3, previous max SUV 3.7. Incidental CT findings: none IMPRESSION: 1. Newly mildly hypermetabolic mildly enlarged right subcarinal lymph node, cannot exclude new nodal metastasis. 2. Widespread patchy confluent sclerotic osseous metastases, increased in sclerosis and stable in distribution. Patchy mild hypermetabolism throughout the axial and proximal appendicular skeleton, which appears new in a few locations and minimally increased in the spine in some locations, as detailed, suggesting mild progression of metabolically active skeletal metastases. 3. Previously visualized pulmonary nodules appear absent on today's low-dose CT images. 4. Chronic findings include: Aortic Atherosclerosis (ICD10-I70.0). Coronary atherosclerosis. Stable ectatic 4.2 cm ascending thoracic aorta and ectatic 2.6 cm infrarenal abdominal aorta. Small hiatal hernia. Moderate sigmoid diverticulosis. Electronically Signed   By: Ilona Sorrel M.D.   On: 03/03/2020 10:29     ASSESSMENT:  1. Metastatic castration sensitive prostate cancer: -History of prostate cancer diagnosed 08/28/2014, Gleason 4+4=8, status post XRT and seed implants. -Presentation with low back pain of several months, PSA on 06/19/2019 was 239, testosterone 210, alkaline phosphatase 306. -MRI of  the cervical, thoracic and lumbar spine on 07/27/2019 shows diffuse bone metastatic disease. Mild to moderate compression deformity of T3 with mild ventral epidural extension. No cord compression. 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 -CT angio of the chest, abdomen and pelvis on 06/27/2019 shows multiple lung nodules bilaterally, largest in the left upper lobe measuring 2.5 x 1.6 x 1.5 cm with associated left hilar and AP window lymphadenopathy. -Navigational bronchoscopy and biopsy on 07/02/2019 with upper lobe brushing and washing shows atypical cells. 10 L FNA also shows atypical cells. -1 pack/day for 4-5 years, quit 60 years ago. Positive agent orange exposure -Lupron 45 mg on 07/12/2019 along with 1 month ofCasodex. -PET scan on 08/11/2019 shows mildly hypermetabolic widespread sclerotic osseous metastasis throughout the axial and proximal appendicular skeleton, increased sclerosis since 06/27/2018 on CT angiogram. This could represent response to therapy. No hypermetabolic lymphadenopathy. Left mediastinal and left hilar adenopathy is seen on 06/19/2019 has resolved. Bilateral pulmonary nodules are stable to decreased in size. Dominant left upper lobe lung nodule has decreased from 2.5 cm to 1.7 cm. -Abiraterone was started around 08/29/2019.  2. Low back pain: -10 treatments of radiation therapy completed on 07/11/2019.  PLAN:  1. Metastatic castration sensitive prostate cancer: -He is taking Abiraterone 4 tablets on empty stomach along with prednisone 5 mg daily. -PSA mproved  to 0.02. -Improvement of hot flashes secondary to starting black cohosh.   2. Low back pain/joint pain: -Continue fentanyl 25 mcg patch.   -He is taking Dilaudid 4 mg 1 to 2 tablets/day as needed. -PET scan on 03/03/2020 showed fairly stable osseous metastasis.  Reviewed with Dr. Delton Coombes who was not overly  impressed with imaging.  Given his PSA is nearly undetectable (0.02) and pain is controlled, he is to continue current treatment regimen with Zytiga and prednisone.  3.  Loss of appetite: -His appetite has improved even without Marinol.  Continued boost/Ensure to 3 cans/day.  4. Constipation:  -Continue stool softeners.  5. Bone mets: -Continue denosumab monthly.  Continue calcium and vitamin D.  6.  Hypertension: -Today systolic blood pressure is 174.  We will start him on lisinopril 10 mg daily.  Disposition: -RTC as scheduled to see Dr. Delton Coombes at the end of the month. -New prescription sent for refill on fentanyl patch.    Orders placed this encounter:  No orders of the defined types were placed in this encounter.  Greater than 50% was spent in counseling and coordination of care with this patient including but not limited to discussion of the relevant topics above (See A&P) including, but not limited to diagnosis and management of acute and chronic medical conditions.   Faythe Casa, NP 03/11/2020 10:56 AM  [place] [phone]

## 2020-03-11 NOTE — Progress Notes (Signed)
Pt is taking Zytiga as prescribed with no side effects.

## 2020-03-13 ENCOUNTER — Other Ambulatory Visit (HOSPITAL_COMMUNITY): Payer: Self-pay

## 2020-03-14 ENCOUNTER — Telehealth (HOSPITAL_COMMUNITY): Payer: Self-pay

## 2020-03-14 NOTE — Telephone Encounter (Signed)
Nutrition Follow-up:  Patient with prostate cancer with mets to bone.  Patient receiving lupron and zytiga.  Spoke with patient via phone for follow-up.  Patient reports that his appetite is good most days.  Eats 3 meals per day. Breakfast ham and eggs or egg sandwich. Lunch yesterday was chili beans and supper last night was 2 tacos.  Drinks ensure tries to get plus shake if he can find it.    Medications: reviewed  Labs: reviewed  Anthropometrics:   Weight 158 lb 4.6 on 1/11 increased from 155 lb on 10/7   NUTRITION DIAGNOSIS: Inadequate oral intake improving   INTERVENTION:  Continue drinking high calorie shake for added nutrition Encouraged high calorie, high protein foods for weight maintenance    MONITORING, EVALUATION, GOAL: weight trends, intake   NEXT VISIT: phone call 6 weeks  Aiko Belko B. Zenia Resides, [place], [place] Registered Dietitian [phone] (mobile)

## 2020-03-27 ENCOUNTER — Inpatient Hospital Stay (HOSPITAL_BASED_OUTPATIENT_CLINIC_OR_DEPARTMENT_OTHER): Payer: Medicare Other | Admitting: Hematology

## 2020-03-27 ENCOUNTER — Inpatient Hospital Stay (HOSPITAL_COMMUNITY): Payer: Medicare Other

## 2020-03-27 ENCOUNTER — Other Ambulatory Visit: Payer: Self-pay

## 2020-03-27 VITALS — BP 140/62 | HR 61 | Temp 98.1°F | Resp 17 | Wt 162.2 lb

## 2020-03-27 DIAGNOSIS — E039 Hypothyroidism, unspecified: Secondary | ICD-10-CM | POA: Diagnosis not present

## 2020-03-27 DIAGNOSIS — I10 Essential (primary) hypertension: Secondary | ICD-10-CM | POA: Diagnosis not present

## 2020-03-27 DIAGNOSIS — C61 Malignant neoplasm of prostate: Secondary | ICD-10-CM | POA: Diagnosis not present

## 2020-03-27 DIAGNOSIS — C7951 Secondary malignant neoplasm of bone: Secondary | ICD-10-CM

## 2020-03-27 DIAGNOSIS — K5900 Constipation, unspecified: Secondary | ICD-10-CM | POA: Diagnosis not present

## 2020-03-27 LAB — COMPREHENSIVE METABOLIC PANEL
ALT: 22 U/L (ref 0–44)
AST: 22 U/L (ref 15–41)
Albumin: 3.9 g/dL (ref 3.5–5.0)
Alkaline Phosphatase: 55 U/L (ref 38–126)
Anion gap: 9 (ref 5–15)
BUN: 14 mg/dL (ref 8–23)
CO2: 24 mmol/L (ref 22–32)
Calcium: 9.2 mg/dL (ref 8.9–10.3)
Chloride: 104 mmol/L (ref 98–111)
Creatinine, Ser: 0.6 mg/dL — ABNORMAL LOW (ref 0.61–1.24)
GFR, Estimated: >60 mL/min (ref >60)
Glucose, Bld: 104 mg/dL — ABNORMAL HIGH (ref 70–99)
Potassium: 3.9 mmol/L (ref 3.5–5.1)
Sodium: 137 mmol/L (ref 135–145)
Total Bilirubin: 0.6 mg/dL (ref 0.3–1.2)
Total Protein: 7 g/dL (ref 6.5–8.1)

## 2020-03-27 LAB — CBC WITH DIFFERENTIAL/PLATELET
Abs Immature Granulocytes: 0.02 10*3/uL (ref 0.00–0.07)
Basophils Absolute: 0.1 10*3/uL (ref 0.0–0.1)
Basophils Relative: 1 %
Eosinophils Absolute: 0.7 10*3/uL — ABNORMAL HIGH (ref 0.0–0.5)
Eosinophils Relative: 10 %
HCT: 41.9 % (ref 39.0–52.0)
Hemoglobin: 13.6 g/dL (ref 13.0–17.0)
Immature Granulocytes: 0 %
Lymphocytes Relative: 26 %
Lymphs Abs: 1.8 10*3/uL (ref 0.7–4.0)
MCH: 31.1 pg (ref 26.0–34.0)
MCHC: 32.5 g/dL (ref 30.0–36.0)
MCV: 95.9 fL (ref 80.0–100.0)
Monocytes Absolute: 0.7 10*3/uL (ref 0.1–1.0)
Monocytes Relative: 11 %
Neutro Abs: 3.5 10*3/uL (ref 1.7–7.7)
Neutrophils Relative %: 52 %
Platelets: 317 10*3/uL (ref 150–400)
RBC: 4.37 MIL/uL (ref 4.22–5.81)
RDW: 13.7 % (ref 11.5–15.5)
WBC: 6.8 10*3/uL (ref 4.0–10.5)
nRBC: 0 % (ref 0.0–0.2)

## 2020-03-27 LAB — PSA: Prostatic Specific Antigen: 0.02 ng/mL (ref 0.00–4.00)

## 2020-03-27 MED ORDER — DENOSUMAB 120 MG/1.7ML ~~LOC~~ SOLN
120.0000 mg | Freq: Once | SUBCUTANEOUS | Status: AC
  Administered 2020-03-27: 120 mg via SUBCUTANEOUS
  Filled 2020-03-27: qty 1.7

## 2020-03-27 MED ORDER — HYDROMORPHONE HCL 4 MG PO TABS: 4.0000 mg | ORAL_TABLET | Freq: Three times a day (TID) | ORAL | 0 refills | Status: DC | PRN

## 2020-03-27 MED ORDER — GABAPENTIN 400 MG PO CAPS: 400.0000 mg | ORAL_CAPSULE | Freq: Three times a day (TID) | ORAL | 3 refills | Status: DC

## 2020-03-27 NOTE — Patient Instructions (Signed)
[place] at [facility] Discharge Instructions  You were seen today by Dr. Delton Coombes. He went over your recent results and scans. You received your Xgeva injection today. Start taking Dilaudid three times daily to help control your pain. Purchase saw palmetto over the counter to help with your urination. Dr. Delton Coombes will see you back in 4 weeks for labs and follow up.   Thank you for choosing [place] at [facility] to provide your oncology and hematology care.  To afford each patient quality time with our provider, please arrive at least 15 minutes before your scheduled appointment time.   If you have a lab appointment with the [facility] please come in thru the Main Entrance and check in at the main information desk  You need to re-schedule your appointment should you arrive 10 or more minutes late.  We strive to give you quality time with our providers, and arriving late affects you and other patients whose appointments are after yours.  Also, if you no show three or more times for appointments you may be dismissed from the clinic at the providers discretion.     Again, thank you for choosing [facility].  Our hope is that these requests will decrease the amount of time that you wait before being seen by our physicians.       _____________________________________________________________  Should you have questions after your visit to [facility], please contact our office at [phone] between the hours of 8:00 a.m. and 4:30 p.m.  Voicemails left after 4:00 p.m. will not be returned until the following business day.  For prescription refill requests, have your pharmacy contact our office and allow 72 hours.    Cancer Center Support Programs:   > Cancer Support Group  2nd Tuesday of the month 1pm-2pm, Journey Room

## 2020-03-27 NOTE — Progress Notes (Signed)
Patient was assessed by Dr. Delton Coombes and labs have been reviewed.  Patient is okay to proceed with Xgeva injection today. Primary nurse and pharmacy aware.

## 2020-03-27 NOTE — Progress Notes (Signed)
Keith Olson. presents today for office visit and injection per the provider's orders.  CA 9.2 and denies jaw pain and previous or future dental work.  Xgeva administered to Right Arm without incident; injection site WNL; see MAR for injection details.  Patient tolerated procedure well and without incident.  No questions or complaints noted at this time.  Patient discharged ambulatory and in stable condition with family member.

## 2020-03-27 NOTE — Progress Notes (Signed)
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    CANCER STAGING: Cancer Staging No matching staging information was found for the patient.  INTERVAL HISTORY:  [name], a 80 y.o. male, returns for routine follow-up of his metastatic castration sensitive prostate cancer. Keith Olson was last seen by Keith Casa, NP, on 03/11/2020.   Today he is accompanied by his son and he reports feeling okay. He complains of having pain in his shoulders. He takes Dilaudid 4 mg BID, at 6 AM and 4 PM, which helps bring down the pain but the pain control does not last in the morning; it does not make him drowsy. He continues taking gabapentin TID. He takes Zytiga 1,000 mg in the morning before eating. He is drinking 3-4 cans of Ensure daily. He complains of having dysuria and strains to start the flow. He stopped taking Flomax because they did not seem to help with the weak stream and has been drinking more water. He denies having back pain, though he reports having occasional swelling and sensation of pressure in his right hamstring.   REVIEW OF SYSTEMS:  Review of Systems  Constitutional: Positive for fatigue (50%). Negative for appetite change.  Cardiovascular: Positive for leg swelling (occasional swelling & fullness in R hamstring).  Genitourinary: Positive for difficulty urinating (difficulty starting stream) and dysuria.   Musculoskeletal: Positive for arthralgias (6/10 bilat shoulder and hip pain).  Neurological: Positive for numbness.  All other systems  reviewed and are negative.   PAST MEDICAL/SURGICAL HISTORY:  Past Medical History:  Diagnosis Date  . Agent orange exposure   . Arthritis   . Coronary artery disease    BMS to mid LAD 2001, DES to proximal LAD 2017  . Enlarged prostate    XRT 2016  . Headache    Sinus headaches   . Heart abnormality   . History of depression   . Hyperlipidemia   . Hypothyroidism   . Prostate cancer (Keith Olson) 07/2014   hormonal therapy, external beam radiation therapy  . PTSD (post-traumatic stress disorder)    Past Surgical History:  Procedure Laterality Date  . BRONCHIAL BRUSHINGS  07/02/2019   Procedure: BRONCHIAL BRUSHINGS;  Surgeon: Keith Olson;  Location: [facility];  Service: Cardiopulmonary;;  . BRONCHIAL NEEDLE ASPIRATION BIOPSY  07/02/2019   Procedure: BRONCHIAL NEEDLE ASPIRATION BIOPSIES;  Surgeon: Keith Olson;  Location: [facility];  Service: Cardiopulmonary;;  . BRONCHIAL WASHINGS  07/02/2019   Procedure: BRONCHIAL WASHINGS;  Surgeon: Keith Olson;  Location: [facility];  Service: Cardiopulmonary;;  . CARDIAC CATHETERIZATION N/A 12/12/2015   Procedure: Left Heart Cath and Coronary Angiography;  Surgeon: Keith Olson;  Location: [place] [facility];  Service: Cardiovascular;  Laterality: N/A;  . CARDIAC CATHETERIZATION N/A 12/12/2015   Procedure: Coronary Stent Intervention;  Surgeon: Keith Olson;  Location: [place] [facility];  Service: Cardiovascular;  Laterality: N/A;  . CORONARY STENT INTERVENTION N/A 09/06/2017   Procedure: CORONARY STENT INTERVENTION;  Surgeon: Keith Olson;  Location: [place] [facility]  [facility];  Service: Cardiovascular;  Laterality: N/A;  . ENDOBRONCHIAL ULTRASOUND N/A 07/02/2019   Procedure: ENDOBRONCHIAL ULTRASOUND;  Surgeon: Keith Olson;  Location: [facility];  Service: Cardiopulmonary;  Laterality: N/A;  . FLEXIBLE BRONCHOSCOPY  07/02/2019   Procedure: FLEXIBLE BRONCHOSCOPY;  Surgeon: Keith Olson;  Location: [facility];  Service: Cardiopulmonary;;  . HERNIA REPAIR Right   . LEFT HEART CATH AND CORONARY ANGIOGRAPHY N/A 09/02/2016   Procedure: Left Heart Cath and Coronary Angiography;  Surgeon: Keith Olson;  Location: [place] [facility];  Service: Cardiovascular;  Laterality: N/A;  . LEFT HEART CATH AND CORONARY ANGIOGRAPHY N/A 09/06/2017   Procedure: LEFT HEART CATH AND CORONARY ANGIOGRAPHY;  Surgeon: Keith Olson;  Location: [place] [facility];  Service: Cardiovascular;  Laterality: N/A;  . LEFT HEART CATH AND CORONARY ANGIOGRAPHY N/A 05/04/2019   Procedure: LEFT HEART CATH AND CORONARY ANGIOGRAPHY;  Surgeon: Keith Olson;  Location: [place] [facility];  Service: Cardiovascular;  Laterality: N/A;  . PROSTATE BIOPSY N/A 08/28/2014   Procedure: BIOPSY TRANSRECTAL ULTRASONIC PROSTATE (TUBP);  Surgeon: Keith Olson;  Location: [facility];  Service: Urology;  Laterality: N/A;  . TRANSURETHRAL RESECTION OF PROSTATE N/A 08/28/2014   Procedure: TRANSURETHRAL RESECTION OF THE PROSTATE WITH GYRUS INSTRUMENTS;  Surgeon: Keith Olson;  Location: [facility];  Service: Urology;  Laterality: N/A;    SOCIAL HISTORY:  Social History   Socioeconomic History  . Marital status: Married    Spouse name: Not on file  . Number of children: 5  . Years of education: Not on file  . Highest education level: Not on file  Occupational History  . Occupation: retired  Tobacco Use  . Smoking status: Never Smoker  . Smokeless tobacco: Former Systems developer    Types: Secondary school teacher  . Vaping Use: Never used  Substance and Sexual Activity  . Alcohol use: No  . Drug use: No  . Sexual activity: Yes  Other Topics Concern  . Not on file  Social History Narrative   Married   5 children   Social Determinants of Health   Financial Resource Strain: Low Risk   . Difficulty of Paying Living Expenses: Not hard at all  Food Insecurity: No Food Insecurity  . Worried About Charity fundraiser in the Last Year:  Never true  . Ran Out of Food in the Last Year: Never true  Transportation Needs: No Transportation Needs  . Lack of Transportation (Medical): No  . Lack of Transportation (Non-Medical): No  Physical Activity: Inactive  . Days of Exercise per Week: 0 days  . Minutes of Exercise per Session: 0 min  Stress: No Stress Concern Present  . Feeling of Stress : Not at all  Social Connections: Moderately Isolated  . Frequency of Communication with Friends and Family: More than three times a week  . Frequency of Social Gatherings with Friends and Family: More than three times a week  . Attends Religious Services: Never  . Active Member of Clubs or Organizations: No  . Attends Archivist Meetings: Never  . Marital Status: Married  Human resources officer Violence: Not At Risk  . Fear of Current or Ex-Partner: No  . Emotionally Abused: No  . Physically Abused: No  . Sexually Abused: No    FAMILY HISTORY:  Family History  Problem Relation Age of Onset  . Arthritis Mother   . Heart attack Father     CURRENT  MEDICATIONS:  Current Outpatient Medications  Medication Sig Dispense Refill  . abiraterone acetate (ZYTIGA) 250 MG tablet TAKE 4 TABLETS (1,000 MG TOTAL) BY MOUTH DAILY. TAKE ON AN EMPTY STOMACH 1 HOUR BEFORE OR 2 HOURS AFTER A MEAL 120 tablet 0  . abiraterone acetate (ZYTIGA) 250 MG tablet TAKE 4 TABLETS (1,000 MG TOTAL) BY MOUTH DAILY. TAKE ON AN EMPTY STOMACH 1 HOUR BEFORE OR 2 HOURS AFTER A MEAL 120 tablet 6  . ALPRAZolam (XANAX) 0.5 MG tablet Take 1 tablet (0.5 mg total) by mouth 2 (two) times daily as needed for anxiety. 10 tablet 0  . aspirin EC 81 MG tablet Take 1 tablet (81 mg total) by mouth daily.    . cetirizine (ZYRTEC) 10 MG tablet Take 10 mg by mouth daily.    Marland Kitchen dexamethasone (DECADRON) 4 MG tablet Take 1 tablet (4 mg total) by mouth 2 (two) times daily with a meal. 60 tablet 1  . diclofenac sodium (VOLTAREN) 1 % GEL APPLY 2 GRAMS TOPICALLY 4 TIMES DAILY 100 g 5  .  dronabinol (MARINOL) 2.5 MG capsule Take 1 capsule (2.5 mg total) by mouth 2 (two) times daily before a meal. 60 capsule 2  . DULoxetine (CYMBALTA) 60 MG capsule Take 60 mg by mouth daily.    . fentaNYL (DURAGESIC) 25 MCG/HR Place 1 patch onto the skin every 3 (three) days. 10 patch 0  . fluticasone (FLONASE) 50 MCG/ACT nasal spray Place into both nostrils.    . folic acid (FOLVITE) 1 MG tablet Take 1 tablet (1 mg total) by mouth daily.    . furosemide (LASIX) 20 MG tablet Take 20 mg by mouth 2 (two) times daily.     Marland Kitchen gabapentin (NEURONTIN) 400 MG capsule Take 1 capsule (400 mg total) by mouth 3 (three) times daily. 90 capsule 3  . HYDROmorphone (DILAUDID) 4 MG tablet Take 1 tablet (4 mg total) by mouth every 8 (eight) hours as needed for severe pain. 90 tablet 0  . lisinopril (PRINIVIL) 10 MG tablet Take 1 tablet (10 mg total) by mouth daily. 30 tablet 2  . meclizine (ANTIVERT) 25 MG tablet Take 1 tablet (25 mg total) by mouth 2 (two) times daily as needed for dizziness. 30 tablet 0  . NARCAN 4 MG/0.1ML LIQD nasal spray kit CALL 911. ADMINISTER A SINGLE SPRAY OF NARCAN IN ONE NOSTRIL. REPEAT EVERY 3 MINUTES AS NEEDED IF NO OR MINIMAL RESPONSE.    . nitroGLYCERIN (NITROSTAT) 0.4 MG SL tablet Place 1 tablet (0.4 mg total) under the tongue every 5 (five) minutes as needed for chest pain. X 3 doses 25 tablet 12  . pantoprazole (PROTONIX) 40 MG tablet Take 1 tablet (40 mg total) by mouth daily. 30 tablet 3  . polyethylene glycol powder (GLYCOLAX/MIRALAX) 17 GM/SCOOP powder Take 17 g by mouth daily as needed for mild constipation or moderate constipation.     . predniSONE (DELTASONE) 5 MG tablet Take 1 tablet (5 mg total) by mouth daily with breakfast. 30 tablet 6  . tamsulosin (FLOMAX) 0.4 MG CAPS capsule Take 0.4 mg by mouth daily.    Marland Kitchen thiamine 100 MG tablet Take 1 tablet (100 mg total) by mouth daily.     No current facility-administered medications for this visit.    ALLERGIES:  Allergies   Allergen Reactions  . Lipitor [Atorvastatin]     Muscles aches    PHYSICAL EXAM:  Performance status (ECOG): 1 - Symptomatic but completely ambulatory  Vitals:   03/27/20 1222  BP: 140/62  Pulse: 61  Resp: 17  Temp: 98.1 F (36.7 C)  SpO2: 100%   Wt Readings from Last 3 Encounters:  03/27/20 162 lb 3 oz (73.6 kg)  03/11/20 158 lb 4.6 oz (71.8 kg)  01/31/20 163 lb (73.9 kg)   Physical Exam   LABORATORY DATA:  I have reviewed the labs as listed.  CBC Latest Ref Rng & Units 03/27/2020 01/31/2020 01/03/2020  WBC 4.0 - 10.5 K/uL 6.8 8.3 7.4  Hemoglobin 13.0 - 17.0 g/dL 13.6 13.7 15.7  Hematocrit 39.0 - 52.0 % 41.9 41.9 49.5  Platelets 150 - 400 K/uL 317 220 258   CMP Latest Ref Rng & Units 03/27/2020 02/28/2020 01/31/2020  Glucose 70 - 99 mg/dL 104(H) 105(H) 99  BUN 8 - 23 mg/dL _0 Creatinine 0.61 - 1.24 mg/dL 0.60(L) 0.64 0.58(L)  Sodium 135 - 145 mmol/L 137 135 138  Potassium 3.5 - 5.1 mmol/L 3.9 4.4 3.9  Chloride 98 - 111 mmol/L 104 101 100  CO2 22 - 32 mmol/L _1 Calcium 8.9 - 10.3 mg/dL 9.2 9.3 8.9  Total Protein 6.5 - 8.1 g/dL 7.0 7.3 6.6  Total Bilirubin 0.3 - 1.2 mg/dL 0.6 0.4 0.5  Alkaline Phos 38 - 126 U/L 55 67 48  AST 15 - 41 U/L _2 ALT 0 - 44 U/L _3 PSA 0.02 01/31/2020  PSA 0.04 12/06/2019  PSA 0.03 11/01/2019    DIAGNOSTIC IMAGING:  I have independently reviewed the scans and discussed with the patient. NM PET Image Restag (PS) Skull Base To Thigh  Result Date: 03/03/2020 CLINICAL DATA:  Subsequent treatment strategy for metastatic prostate cancer with worsening bone pain. History of palliative radiotherapy to L4 vertebral metastasis. EXAM: NUCLEAR MEDICINE PET SKULL BASE TO THIGH TECHNIQUE: 8.0 mCi F-18 FDG was injected intravenously. Full-ring PET imaging was performed from the skull base to thigh after the radiotracer. CT data was obtained and used for attenuation correction and anatomic localization. Fasting blood glucose:  93 mg/dl COMPARISON:  08/10/2019 PET-CT. FINDINGS: Mediastinal blood pool activity: SUV max 3.0 Liver activity: SUV max NA NECK: No hypermetabolic lymph nodes in the neck. Incidental CT findings: Mild mucoperiosteal thickening in the dependent maxillary sinuses bilaterally. No paranasal sinus fluid levels. CHEST: Newly hypermetabolic mildly enlarged 1.0 cm right subcarinal node with max SUV 4.1 (series 4/image 73). No additional enlarged or hypermetabolic mediastinal nodes. No hypermetabolic hilar or axillary nodes. No hypermetabolic pulmonary findings. Incidental CT findings: Coronary atherosclerosis. Atherosclerotic thoracic aorta with stable ectatic 4.2 cm ascending thoracic aorta. No acute consolidative airspace disease or significant pulmonary nodules. Previously visualized scattered pulmonary nodules are not appreciated on today's scan. ABDOMEN/PELVIS: No abnormal hypermetabolic activity within the liver, pancreas, adrenal glands, or spleen. No hypermetabolic lymph nodes in the abdomen or pelvis. Incidental CT findings: Small hiatal hernia. Subcentimeter hypodense lateral segment left liver lesion is too small to characterize and is unchanged, considered benign. Moderate diffuse colonic stool. Moderate sigmoid diverticulosis. Atrophic prostate with associated fiducial markers. Atherosclerotic abdominal aorta with stable ectatic 2.6 cm infrarenal abdominal aorta. SKELETON: Widespread patchy confluent sclerotic osseous lesions throughout the axial and proximal appendicular skeleton, appearing stable in distribution and increased in sclerosis throughout. There is patchy mild hypermetabolism throughout the axial and proximal appendicular skeleton, which appears new in a few locations, for example in the inferior right sacral with max SUV 3.7 and in the proximal right humeral metaphysis with max SUV 6.4, and which  appears minimally increased in the spine in some locations, for example in the T12 vertebral body with  max SUV 4.3, previous max SUV 3.7. Incidental CT findings: none IMPRESSION: 1. Newly mildly hypermetabolic mildly enlarged right subcarinal lymph node, cannot exclude new nodal metastasis. 2. Widespread patchy confluent sclerotic osseous metastases, increased in sclerosis and stable in distribution. Patchy mild hypermetabolism throughout the axial and proximal appendicular skeleton, which appears new in a few locations and minimally increased in the spine in some locations, as detailed, suggesting mild progression of metabolically active skeletal metastases. 3. Previously visualized pulmonary nodules appear absent on today's low-dose CT images. 4. Chronic findings include: Aortic Atherosclerosis (ICD10-I70.0). Coronary atherosclerosis. Stable ectatic 4.2 cm ascending thoracic aorta and ectatic 2.6 cm infrarenal abdominal aorta. Small hiatal hernia. Moderate sigmoid diverticulosis. Electronically Signed   By: Ilona Sorrel M.D.   On: 03/03/2020 10:29     ASSESSMENT:  1. Metastatic castration sensitive prostate cancer: -History of prostate cancer diagnosed 08/28/2014, Gleason 4+4=8, status post XRT and seed implants. -Presentation with low back pain of several months, PSA on 06/19/2019 was 239, testosterone 210, alkaline phosphatase 306. -MRI of the cervical, thoracic and lumbar spine on 07/27/2019 shows diffuse bone metastatic disease. Mild to moderate compression deformity of T3 with mild ventral epidural extension. No cord compression. 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 -CT angio of the chest, abdomen and pelvis on 06/27/2019 shows multiple lung nodules bilaterally, largest in the left upper lobe measuring 2.5 x 1.6 x 1.5 cm with associated left hilar and AP window lymphadenopathy. -Navigational bronchoscopy and biopsy on 07/02/2019 with upper lobe brushing and washing shows atypical cells. 10 L FNA also shows  atypical cells. -1 pack/day for 4-5 years, quit 60 years ago. Positive agent orange exposure -Lupron 45 mg on 07/12/2019 along with 1 month ofCasodex. -PET scan on 08/11/2019 shows mildly hypermetabolic widespread sclerotic osseous metastasis throughout the axial and proximal appendicular skeleton, increased sclerosis since 06/27/2018 on CT angiogram. This could represent response to therapy. No hypermetabolic lymphadenopathy. Left mediastinal and left hilar adenopathy is seen on 06/19/2019 has resolved. Bilateral pulmonary nodules are stable to decreased in size. Dominant left upper lobe lung nodule has decreased from 2.5 cm to 1.7 cm. -Abiraterone was started around 08/29/2019. -PET scan was done on 03/03/2020 as he complained of joint pains.  New mildly hypermetabolic mildly enlarged right subcarinal lymph node measuring 1 cm, SUV 4.1.  No other lymphadenopathy.  Widespread sclerotic lesions throughout the axial and proximal appendicular skeleton, appearing stable in distribution and increase in sclerosis.  2. Low back pain: -10 treatments of radiation therapy completed on 07/11/2019.   PLAN:  1. Metastatic castration sensitive prostate cancer: -Reviewed PET scan results.  There is increased sclerosis in the bones from bisphosphonate therapy.  1 isolated lymph node in the right subcarinal region is new and nonspecific. -He complains of joint pains particularly in the shoulders and elbows.  Likely related to Abiraterone. -PET scan findings are unlikely indicative of progression as his PSA is remaining at 0.02. -He reports some urinary hesitancy.  We have given Flomax which apparently did not help.  However symptoms have improved since he started drinking more water.  If no improvement will refer to urology. -RTC 4 weeks for follow-up with labs.  2. Low back pain: -Continue fentanyl patch 25 mcg daily. -He reports pain is not being controlled for more than 4 hours after taking Dilaudid.  He  is currently taking Dilaudid in the morning and second pill at 4 PM. -I have told him to take Dilaudid 3 times a day.  I have sent a new refill.  3.Loss of appetite: -He reports taking Marinol 2.5 mg twice daily.  Appetite has improved.  Weight improved by 4 pounds. -He is drinking Ensure 3 to 4 cans/day.  4. Constipation: -Continue stool softeners.  5. Bone mets: -Continue denosumab today.  Calcium is normal.  Continue calcium and vitamin D.  6.  Hypertension: -Continue lisinopril 10 mg daily.  Blood pressure is 140.  7.  Hot flashes: -Continue black cohosh.  It is helping.   Orders placed this encounter:  Orders Placed This Encounter  Procedures  . CBC with Differential/Platelet  . Comprehensive metabolic panel  . PSA     Derek Jack, [place] [phone]   I, Milinda Antis, am acting as a scribe for Dr. Sanda Linger.  I, Derek Jack Olson, have reviewed the above documentation for accuracy and completeness, and I agree with the above.

## 2020-04-03 MED FILL — ABIRATERONE ACETATE 250 MG: 250 | 30 days supply | Qty: 120 | Fill #4

## 2020-04-04 ENCOUNTER — Other Ambulatory Visit: Payer: Self-pay

## 2020-04-04 ENCOUNTER — Emergency Department (HOSPITAL_COMMUNITY): Payer: Medicare Other

## 2020-04-04 ENCOUNTER — Encounter (HOSPITAL_COMMUNITY): Payer: Self-pay

## 2020-04-04 ENCOUNTER — Inpatient Hospital Stay (HOSPITAL_COMMUNITY)
Admission: EM | Admit: 2020-04-04 | Discharge: 2020-04-09 | DRG: 194 | Disposition: A | Discharge status: Home Health | Payer: Medicare Other | Attending: Internal Medicine | Admitting: Internal Medicine

## 2020-04-04 DIAGNOSIS — Z7952 Long term (current) use of systemic steroids: Secondary | ICD-10-CM

## 2020-04-04 DIAGNOSIS — I340 Nonrheumatic mitral (valve) insufficiency: Secondary | ICD-10-CM | POA: Diagnosis not present

## 2020-04-04 DIAGNOSIS — Z923 Personal history of irradiation: Secondary | ICD-10-CM | POA: Diagnosis not present

## 2020-04-04 DIAGNOSIS — B9561 Methicillin susceptible Staphylococcus aureus infection as the cause of diseases classified elsewhere: Secondary | ICD-10-CM | POA: Diagnosis not present

## 2020-04-04 DIAGNOSIS — K5900 Constipation, unspecified: Secondary | ICD-10-CM | POA: Diagnosis not present

## 2020-04-04 DIAGNOSIS — E785 Hyperlipidemia, unspecified: Secondary | ICD-10-CM | POA: Diagnosis not present

## 2020-04-04 DIAGNOSIS — G893 Neoplasm related pain (acute) (chronic): Secondary | ICD-10-CM | POA: Diagnosis present

## 2020-04-04 DIAGNOSIS — Z20822 Contact with and (suspected) exposure to covid-19: Secondary | ICD-10-CM | POA: Diagnosis not present

## 2020-04-04 DIAGNOSIS — F32A Depression, unspecified: Secondary | ICD-10-CM | POA: Diagnosis not present

## 2020-04-04 DIAGNOSIS — F4310 Post-traumatic stress disorder, unspecified: Secondary | ICD-10-CM | POA: Diagnosis present

## 2020-04-04 DIAGNOSIS — T402X5A Adverse effect of other opioids, initial encounter: Secondary | ICD-10-CM | POA: Diagnosis present

## 2020-04-04 DIAGNOSIS — Z9109 Other allergy status, other than to drugs and biological substances: Secondary | ICD-10-CM | POA: Diagnosis not present

## 2020-04-04 DIAGNOSIS — Z8249 Family history of ischemic heart disease and other diseases of the circulatory system: Secondary | ICD-10-CM | POA: Diagnosis not present

## 2020-04-04 DIAGNOSIS — Z0289 Encounter for other administrative examinations: Secondary | ICD-10-CM

## 2020-04-04 DIAGNOSIS — N138 Other obstructive and reflux uropathy: Secondary | ICD-10-CM | POA: Diagnosis not present

## 2020-04-04 DIAGNOSIS — B9689 Other specified bacterial agents as the cause of diseases classified elsewhere: Secondary | ICD-10-CM | POA: Diagnosis present

## 2020-04-04 DIAGNOSIS — J189 Pneumonia, unspecified organism: Secondary | ICD-10-CM | POA: Diagnosis present

## 2020-04-04 DIAGNOSIS — M1990 Unspecified osteoarthritis, unspecified site: Secondary | ICD-10-CM | POA: Diagnosis not present

## 2020-04-04 DIAGNOSIS — R911 Solitary pulmonary nodule: Secondary | ICD-10-CM | POA: Diagnosis not present

## 2020-04-04 DIAGNOSIS — Z7982 Long term (current) use of aspirin: Secondary | ICD-10-CM

## 2020-04-04 DIAGNOSIS — C7951 Secondary malignant neoplasm of bone: Secondary | ICD-10-CM | POA: Diagnosis not present

## 2020-04-04 DIAGNOSIS — C61 Malignant neoplasm of prostate: Secondary | ICD-10-CM | POA: Diagnosis present

## 2020-04-04 DIAGNOSIS — E039 Hypothyroidism, unspecified: Secondary | ICD-10-CM | POA: Diagnosis present

## 2020-04-04 DIAGNOSIS — J019 Acute sinusitis, unspecified: Secondary | ICD-10-CM | POA: Diagnosis not present

## 2020-04-04 DIAGNOSIS — G9389 Other specified disorders of brain: Secondary | ICD-10-CM | POA: Diagnosis not present

## 2020-04-04 DIAGNOSIS — Z66 Do not resuscitate: Secondary | ICD-10-CM | POA: Diagnosis not present

## 2020-04-04 DIAGNOSIS — M5440 Lumbago with sciatica, unspecified side: Secondary | ICD-10-CM | POA: Diagnosis not present

## 2020-04-04 DIAGNOSIS — D72829 Elevated white blood cell count, unspecified: Secondary | ICD-10-CM | POA: Diagnosis present

## 2020-04-04 DIAGNOSIS — J320 Chronic maxillary sinusitis: Secondary | ICD-10-CM | POA: Diagnosis not present

## 2020-04-04 DIAGNOSIS — R918 Other nonspecific abnormal finding of lung field: Secondary | ICD-10-CM | POA: Diagnosis not present

## 2020-04-04 DIAGNOSIS — A419 Sepsis, unspecified organism: Secondary | ICD-10-CM | POA: Diagnosis not present

## 2020-04-04 DIAGNOSIS — D849 Immunodeficiency, unspecified: Secondary | ICD-10-CM | POA: Diagnosis present

## 2020-04-04 DIAGNOSIS — R0902 Hypoxemia: Secondary | ICD-10-CM | POA: Diagnosis not present

## 2020-04-04 DIAGNOSIS — J3489 Other specified disorders of nose and nasal sinuses: Secondary | ICD-10-CM | POA: Diagnosis not present

## 2020-04-04 DIAGNOSIS — R531 Weakness: Secondary | ICD-10-CM | POA: Diagnosis not present

## 2020-04-04 DIAGNOSIS — I2510 Atherosclerotic heart disease of native coronary artery without angina pectoris: Secondary | ICD-10-CM | POA: Diagnosis not present

## 2020-04-04 DIAGNOSIS — K449 Diaphragmatic hernia without obstruction or gangrene: Secondary | ICD-10-CM | POA: Diagnosis not present

## 2020-04-04 DIAGNOSIS — C801 Malignant (primary) neoplasm, unspecified: Secondary | ICD-10-CM | POA: Diagnosis present

## 2020-04-04 DIAGNOSIS — I351 Nonrheumatic aortic (valve) insufficiency: Secondary | ICD-10-CM | POA: Diagnosis not present

## 2020-04-04 DIAGNOSIS — Z743 Need for continuous supervision: Secondary | ICD-10-CM | POA: Diagnosis not present

## 2020-04-04 DIAGNOSIS — R059 Cough, unspecified: Secondary | ICD-10-CM

## 2020-04-04 DIAGNOSIS — Z79899 Other long term (current) drug therapy: Secondary | ICD-10-CM

## 2020-04-04 DIAGNOSIS — N401 Enlarged prostate with lower urinary tract symptoms: Secondary | ICD-10-CM | POA: Diagnosis present

## 2020-04-04 DIAGNOSIS — K5903 Drug induced constipation: Secondary | ICD-10-CM | POA: Diagnosis present

## 2020-04-04 DIAGNOSIS — R7881 Bacteremia: Secondary | ICD-10-CM | POA: Diagnosis not present

## 2020-04-04 DIAGNOSIS — K429 Umbilical hernia without obstruction or gangrene: Secondary | ICD-10-CM | POA: Diagnosis not present

## 2020-04-04 DIAGNOSIS — E782 Mixed hyperlipidemia: Secondary | ICD-10-CM | POA: Diagnosis not present

## 2020-04-04 LAB — CBC WITH DIFFERENTIAL/PLATELET
Abs Immature Granulocytes: 0.04 10*3/uL (ref 0.00–0.07)
Basophils Absolute: 0.1 10*3/uL (ref 0.0–0.1)
Basophils Relative: 1 %
Eosinophils Absolute: 0.7 10*3/uL — ABNORMAL HIGH (ref 0.0–0.5)
Eosinophils Relative: 4 %
HCT: 39.4 % (ref 39.0–52.0)
Hemoglobin: 12.6 g/dL — ABNORMAL LOW (ref 13.0–17.0)
Immature Granulocytes: 0 %
Lymphocytes Relative: 9 %
Lymphs Abs: 1.4 10*3/uL (ref 0.7–4.0)
MCH: 31.3 pg (ref 26.0–34.0)
MCHC: 32 g/dL (ref 30.0–36.0)
MCV: 98 fL (ref 80.0–100.0)
Monocytes Absolute: 1.2 10*3/uL — ABNORMAL HIGH (ref 0.1–1.0)
Monocytes Relative: 8 %
Neutro Abs: 12.2 10*3/uL — ABNORMAL HIGH (ref 1.7–7.7)
Neutrophils Relative %: 78 %
Platelets: 215 10*3/uL (ref 150–400)
RBC: 4.02 MIL/uL — ABNORMAL LOW (ref 4.22–5.81)
RDW: 14.1 % (ref 11.5–15.5)
WBC: 15.6 10*3/uL — ABNORMAL HIGH (ref 4.0–10.5)
nRBC: 0 % (ref 0.0–0.2)

## 2020-04-04 LAB — LACTIC ACID, PLASMA: Lactic Acid, Venous: 0.9 mmol/L (ref 0.5–1.9)

## 2020-04-04 LAB — RAPID URINE DRUG SCREEN, HOSP PERFORMED
Amphetamines: NOT DETECTED
Barbiturates: NOT DETECTED
Benzodiazepines: POSITIVE — AB
Cocaine: NOT DETECTED
Opiates: POSITIVE — AB
Tetrahydrocannabinol: NOT DETECTED

## 2020-04-04 LAB — HIV ANTIBODY (ROUTINE TESTING W REFLEX): HIV Screen 4th Generation wRfx: NONREACTIVE

## 2020-04-04 LAB — COMPREHENSIVE METABOLIC PANEL
ALT: 21 U/L (ref 0–44)
AST: 24 U/L (ref 15–41)
Albumin: 3.5 g/dL (ref 3.5–5.0)
Alkaline Phosphatase: 54 U/L (ref 38–126)
Anion gap: 7 (ref 5–15)
BUN: 10 mg/dL (ref 8–23)
CO2: 23 mmol/L (ref 22–32)
Calcium: 8.7 mg/dL — ABNORMAL LOW (ref 8.9–10.3)
Chloride: 104 mmol/L (ref 98–111)
Creatinine, Ser: 0.69 mg/dL (ref 0.61–1.24)
GFR, Estimated: >60 mL/min (ref >60)
Glucose, Bld: 112 mg/dL — ABNORMAL HIGH (ref 70–99)
Potassium: 3.8 mmol/L (ref 3.5–5.1)
Sodium: 134 mmol/L — ABNORMAL LOW (ref 135–145)
Total Bilirubin: 0.6 mg/dL (ref 0.3–1.2)
Total Protein: 6.5 g/dL (ref 6.5–8.1)

## 2020-04-04 LAB — I-STAT CHEM 8, ED
BUN: 9 mg/dL (ref 8–23)
Calcium, Ion: 1.07 mmol/L — ABNORMAL LOW (ref 1.15–1.40)
Chloride: 105 mmol/L (ref 98–111)
Creatinine, Ser: 0.6 mg/dL — ABNORMAL LOW (ref 0.61–1.24)
Glucose, Bld: 106 mg/dL — ABNORMAL HIGH (ref 70–99)
HCT: 40 % (ref 39.0–52.0)
Hemoglobin: 13.6 g/dL (ref 13.0–17.0)
Potassium: 3.9 mmol/L (ref 3.5–5.1)
Sodium: 136 mmol/L (ref 135–145)
TCO2: 24 mmol/L (ref 22–32)

## 2020-04-04 LAB — VITAMIN D 25 HYDROXY (VIT D DEFICIENCY, FRACTURES): Vit D, 25-Hydroxy: 33.07 ng/mL (ref 30–100)

## 2020-04-04 LAB — URINALYSIS, ROUTINE W REFLEX MICROSCOPIC
Bacteria, UA: NONE SEEN
Bilirubin Urine: NEGATIVE
Glucose, UA: NEGATIVE mg/dL
Hgb urine dipstick: NEGATIVE
Ketones, ur: NEGATIVE mg/dL
Nitrite: NEGATIVE
Protein, ur: NEGATIVE mg/dL
Specific Gravity, Urine: 1.035 — ABNORMAL HIGH (ref 1.005–1.030)
pH: 5 (ref 5.0–8.0)

## 2020-04-04 LAB — CBG MONITORING, ED: Glucose-Capillary: 117 mg/dL — ABNORMAL HIGH (ref 70–99)

## 2020-04-04 LAB — MRSA PCR SCREENING: MRSA by PCR: NEGATIVE

## 2020-04-04 LAB — SARS CORONAVIRUS 2 BY RT PCR (HOSPITAL ORDER, PERFORMED IN ~~LOC~~ HOSPITAL LAB): SARS Coronavirus 2: NEGATIVE

## 2020-04-04 LAB — APTT: aPTT: 27 seconds (ref 24–36)

## 2020-04-04 LAB — PROTIME-INR
INR: 1 (ref 0.8–1.2)
Prothrombin Time: 13.2 seconds (ref 11.4–15.2)

## 2020-04-04 LAB — TROPONIN I (HIGH SENSITIVITY)
Troponin I (High Sensitivity): 10 ng/L (ref <18)
Troponin I (High Sensitivity): 11 ng/L (ref <18)

## 2020-04-04 IMAGING — CT CT HEAD W/O CM
4 series · 16 of 47 positions shown, 18 images · non-contrast
Comparison: [DATE]

CLINICAL DATA: LANGE lysed weakness

EXAM:
CT HEAD WITHOUT CONTRAST
TECHNIQUE: Contiguous axial images were obtained from the base of the skull
through the vertex without intravenous contrast.

[Series 2: head w o · axial · 0.46mm/px · z∈[+148,+253]mm · 7 of 29 slices shown, 9 images]
[im 4/29  brain]
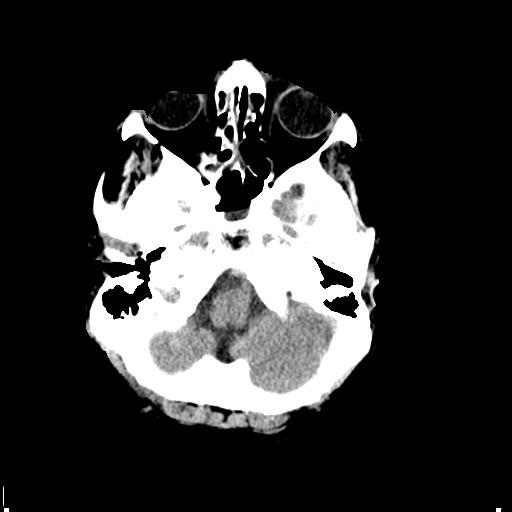
[im 4/29  bone]
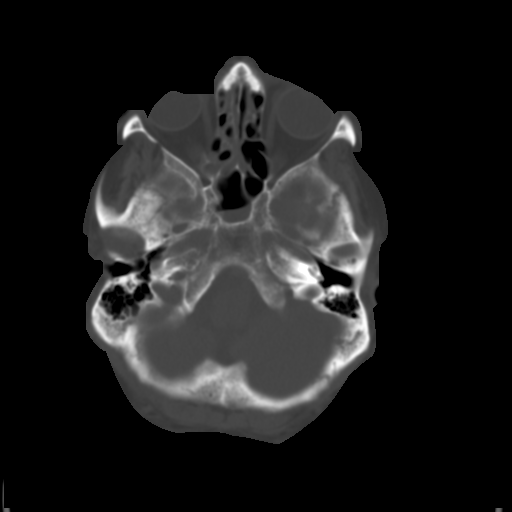
[im 8/29  brain]
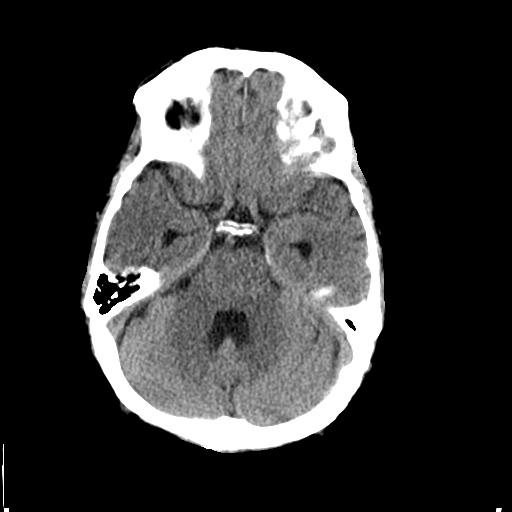
[im 11/29  brain]
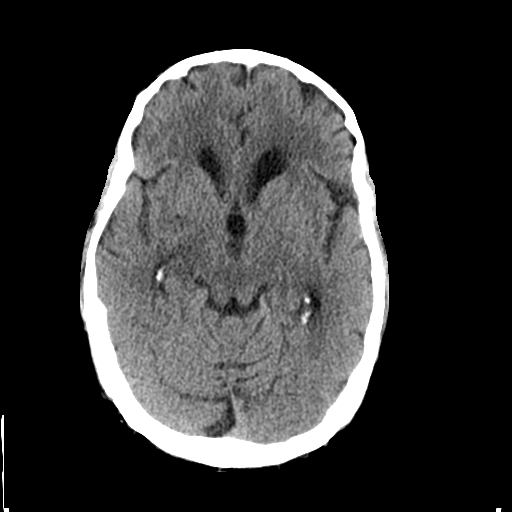
[im 15/29  brain]
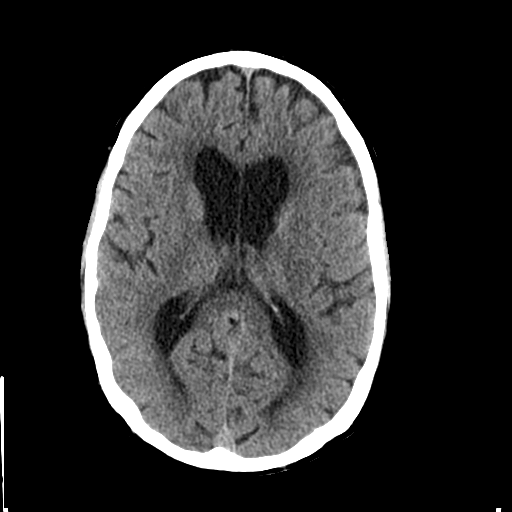
[im 18/29  brain]
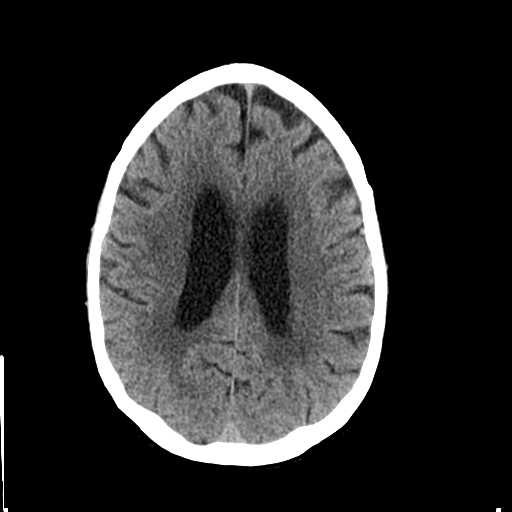
[im 18/29  bone]
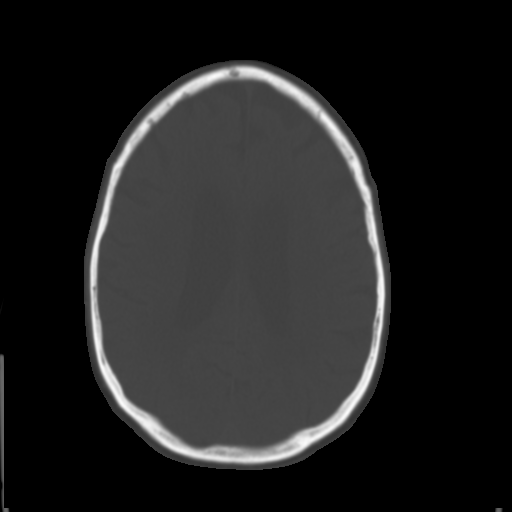
[im 22/29  brain]
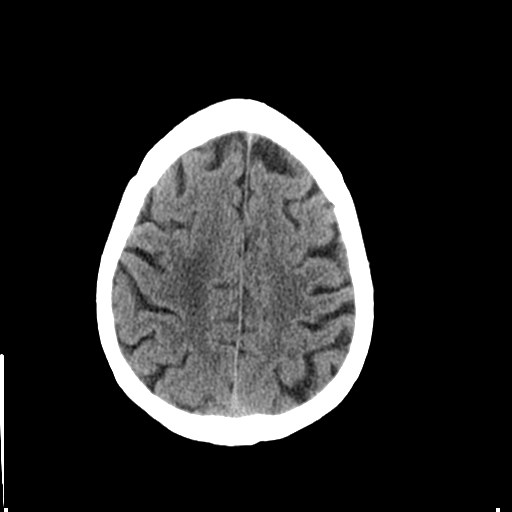
[im 25/29  brain]
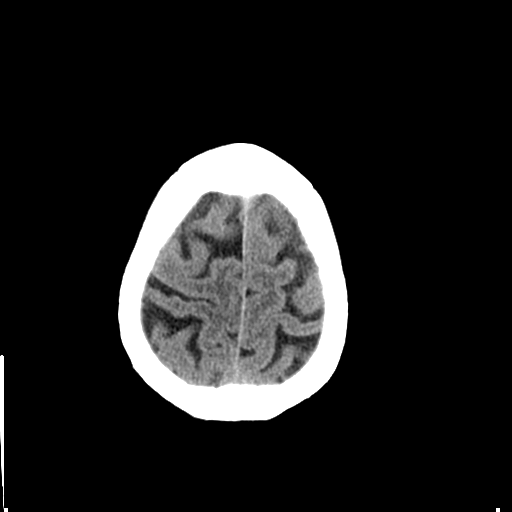

[Series 3: head bone · axial · 0.46mm/px · z∈[+147,+175]mm · 3 of 73 slices shown]
[im 8/73  bone]
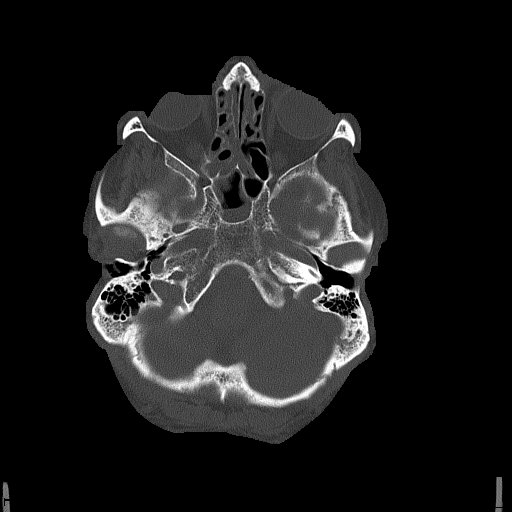
[im 15/73  bone]
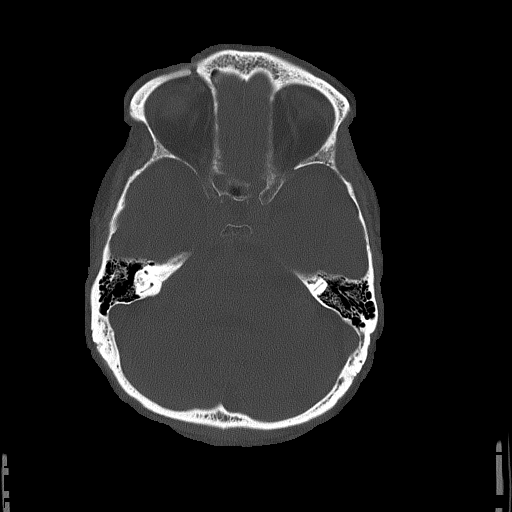
[im 22/73  bone]
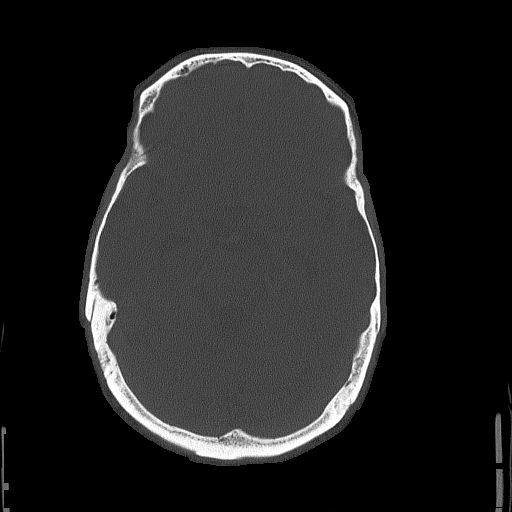

[Series 4: coronal soft · coronal · 0.30mm/px · 3 of 67 slices shown]
[im 23/67  brain]
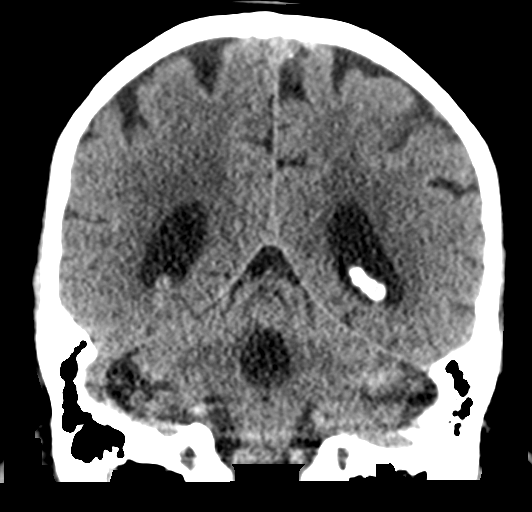
[im 30/67  brain]
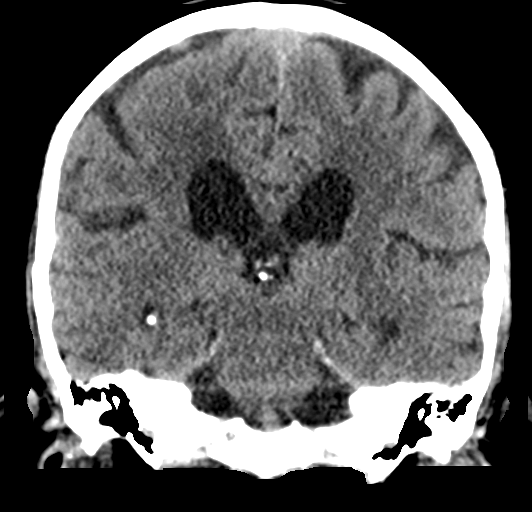
[im 37/67  brain]
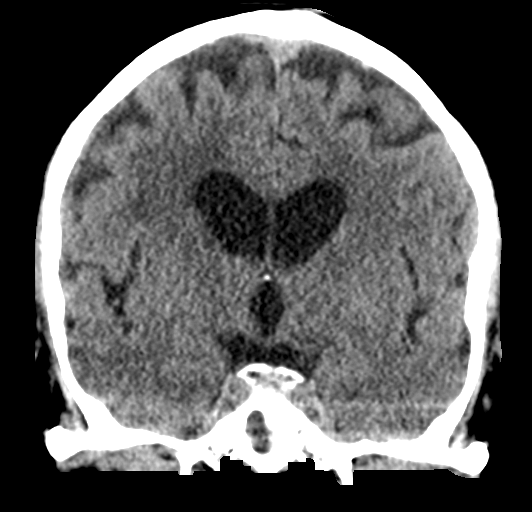

[Series 5: sagittal soft · sagittal · 0.29mm/px · 3 of 53 slices shown]
[im 18/53  brain]
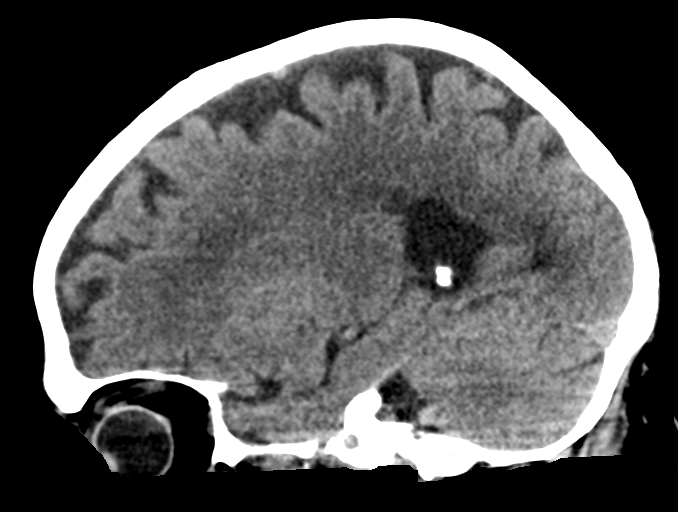
[im 27/53  brain]
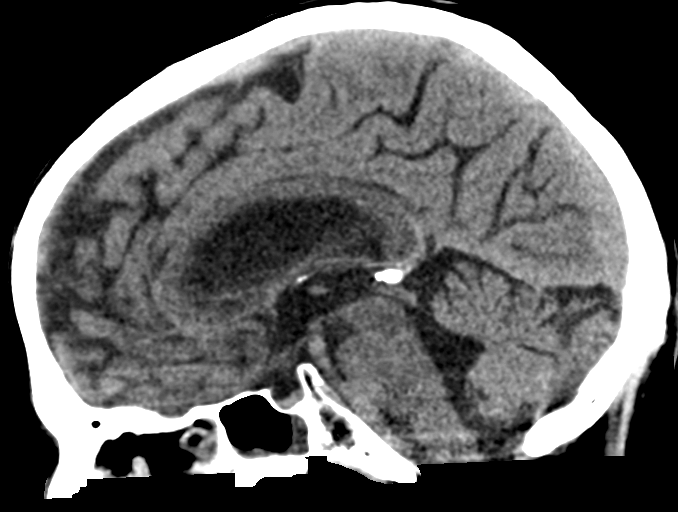
[im 35/53  brain]
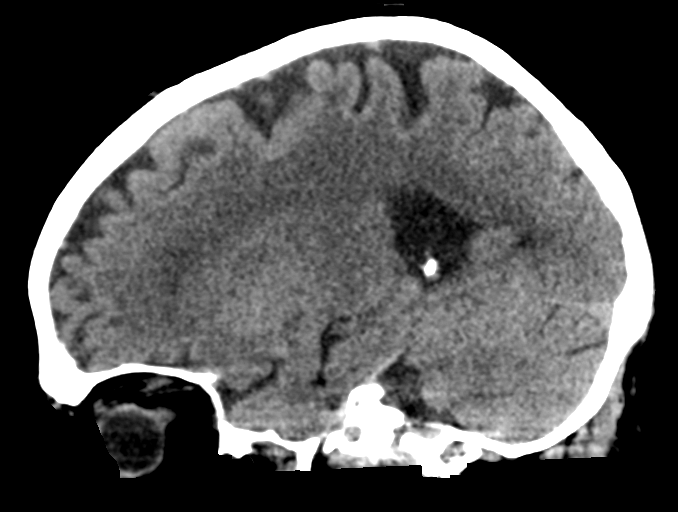

[16 of 47 positions shown; findings below may reference images not displayed]

FINDINGS: Brain: No evidence of acute infarction, hemorrhage, hydrocephalus,
extra-axial collection or mass lesion/mass effect. Global
parenchymal volume loss. Periventricular white matter hypodensities
consistent with sequela of chronic microvascular ischemic disease.

Vascular: No hyperdense vessel or unexpected calcification.

Skull: Normal. Negative for fracture or focal lesion.

Sinuses/Orbits: Air-fluid levels within the sphenoid and RIGHT
maxillary sinus. Mucosal thickening of multiple ethmoid air cells.

Other: None.
IMPRESSION: 1. No acute intracranial abnormality.
2. Air-fluid levels within the sphenoid and RIGHT maxillary sinus.
This could reflect acute sinusitis in the appropriate clinical
setting.

## 2020-04-04 IMAGING — DX DG CHEST 1V PORT
1 series · 1 of 1 positions shown · non-contrast
Comparison: [DATE], CT chest, [DATE]

CLINICAL DATA: Questionable sepsis

EXAM:
PORTABLE CHEST 1 VIEW

[chest ap]
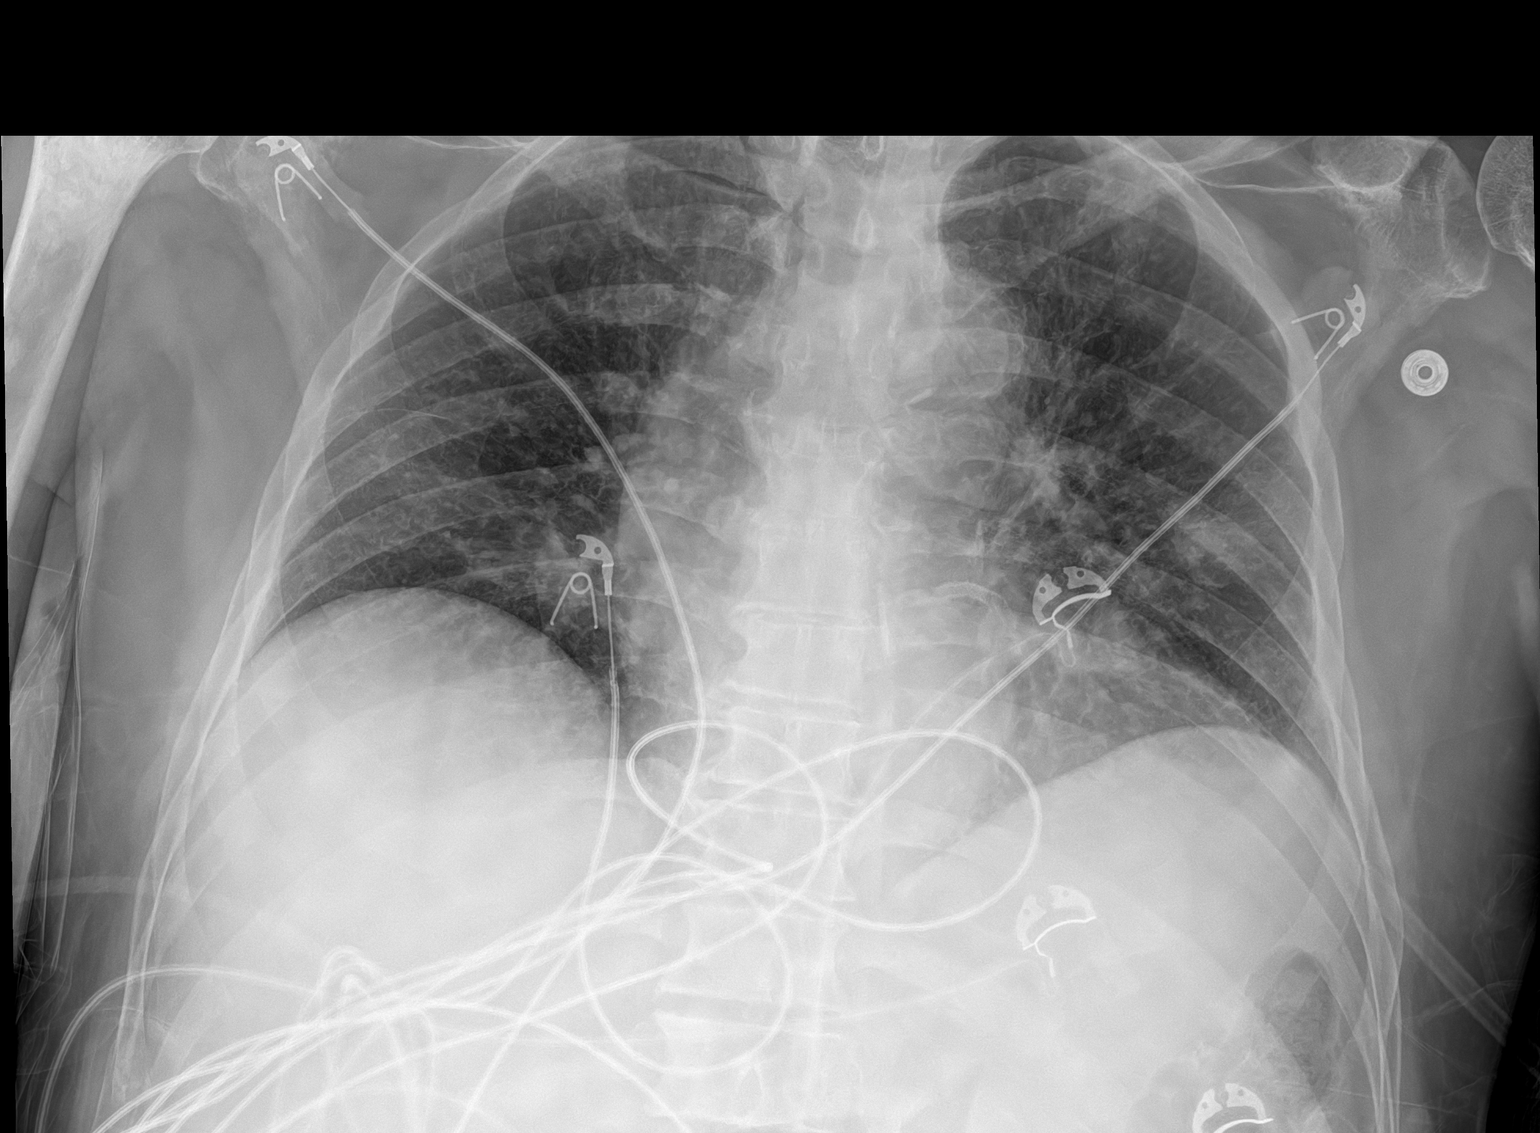

[1 of 1 positions shown; findings below may reference images not displayed]

FINDINGS: Low volume AP portable examination with mild diffuse interstitial
pulmonary opacity. Redemonstrated pulmonary nodule of the left upper
lobe. The heart and mediastinum are unremarkable. Osseous structures
are unremarkable.
IMPRESSION: 1. Low volume AP portable examination with mild diffuse interstitial
pulmonary opacity, likely edema. This appearance may be exaggerated
by low volume technique.
2. Redemonstrated pulmonary nodule of the left upper lobe, better
assessed by prior CT.

## 2020-04-04 IMAGING — CT CT CHEST-ABD-PELV W/ CM
3 of 5 series · 15 of 36 positions shown, 17 images · IV contrast (omnipaque)
Comparison: PET [DATE].

CLINICAL DATA: Weakness, cough, nasal congestion, abdominal pain
and emesis.

EXAM:
CT CHEST, ABDOMEN, AND PELVIS WITH CONTRAST
TECHNIQUE: Multidetector CT imaging of the chest, abdomen and pelvis was
performed following the standard protocol during bolus
administration of intravenous contrast.
CONTRAST:  100mL OMNIPAQUE IOHEXOL 300 MG/ML  SOLN

[Series 3: cap with · axial · 0.82mm/px · z∈[-535,-55]mm · 9 of 122 slices shown, 11 images]
[im 13/122  mediastinal]
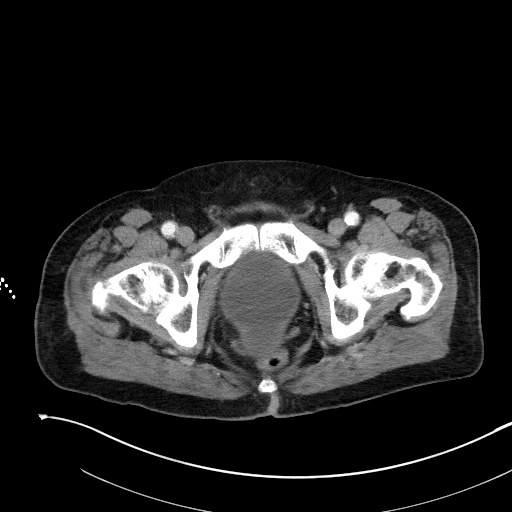
[im 13/122  bone]
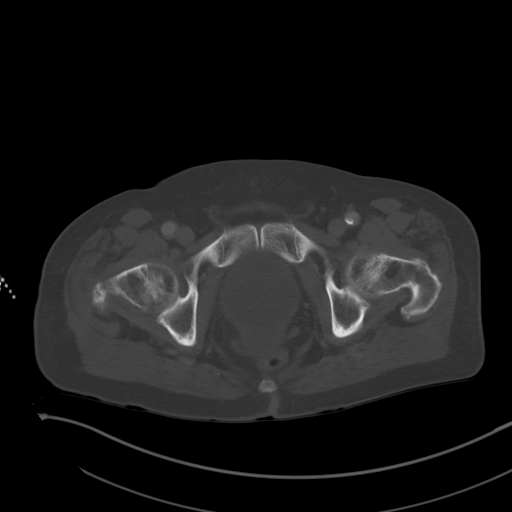
[im 25/122  mediastinal]
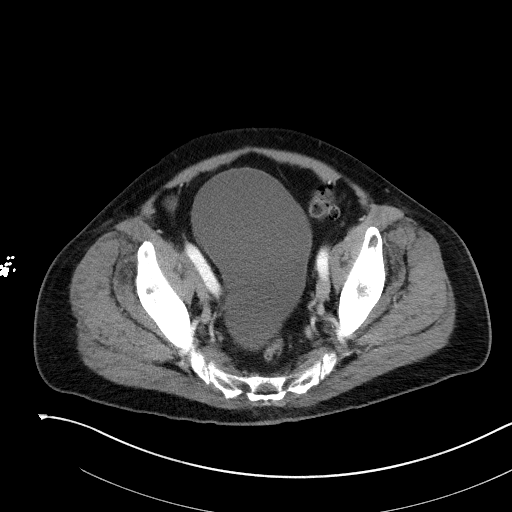
[im 37/122  mediastinal]
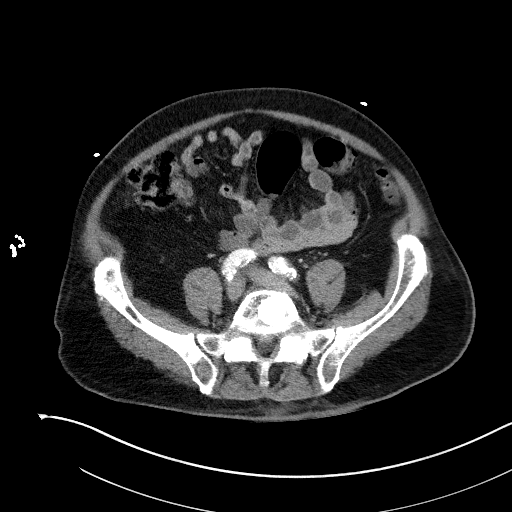
[im 49/122  mediastinal]
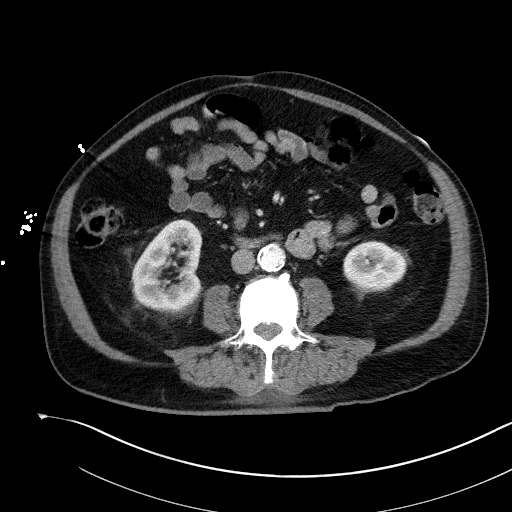
[im 61/122  mediastinal]
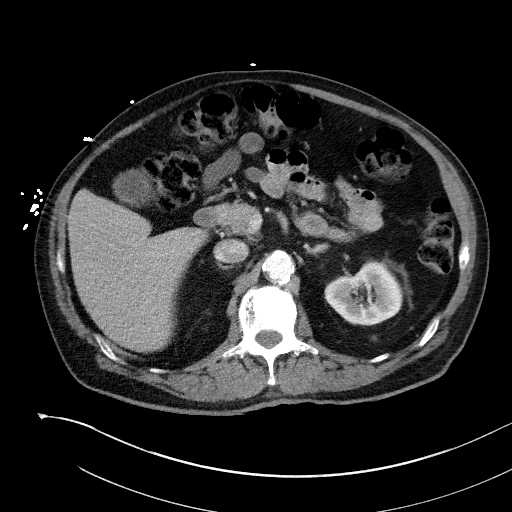
[im 73/122  mediastinal]
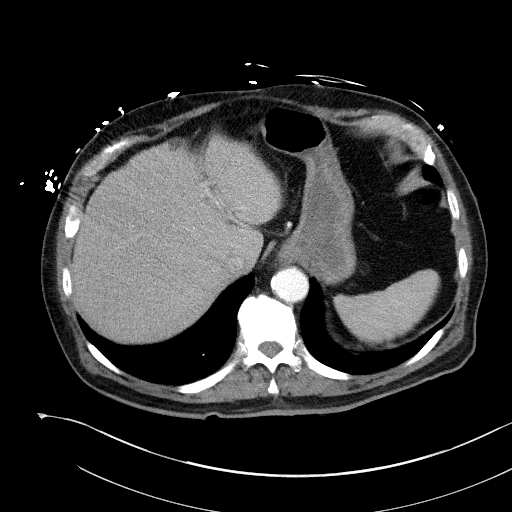
[im 85/122  mediastinal]
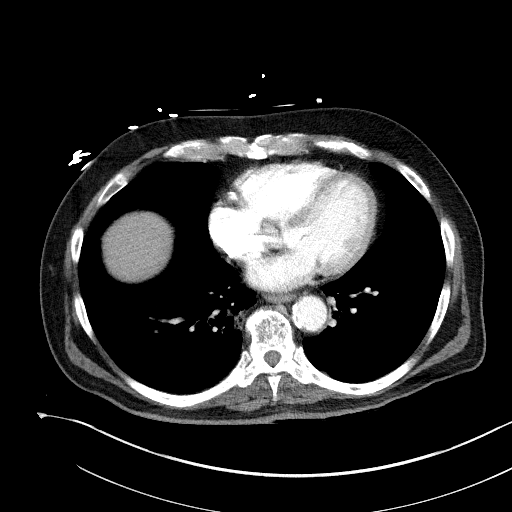
[im 97/122  mediastinal]
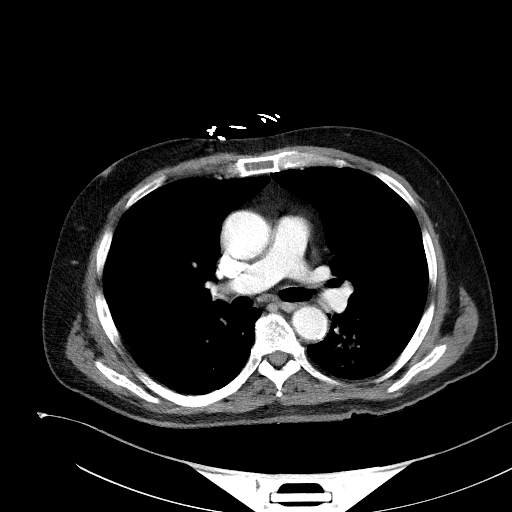
[im 109/122  mediastinal]
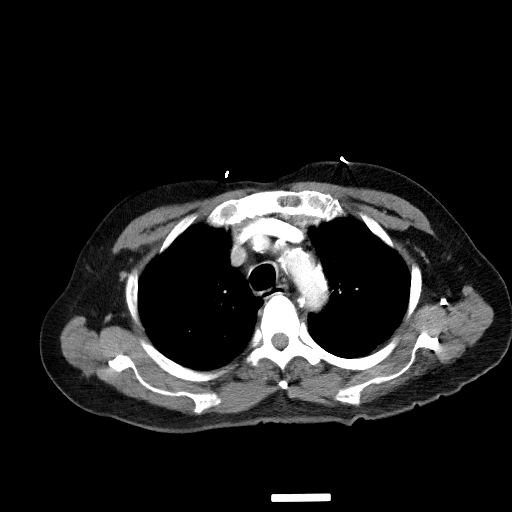
[im 109/122  bone]
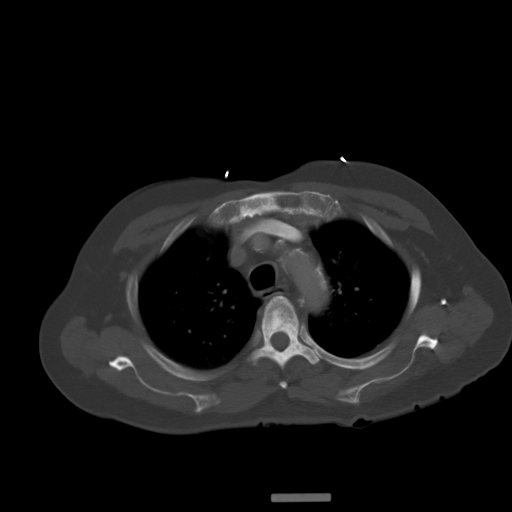

[Series 4: lung · axial · 0.82mm/px · z∈[-302,-224]mm · 3 of 169 slices shown]
[im 13/169  bone]
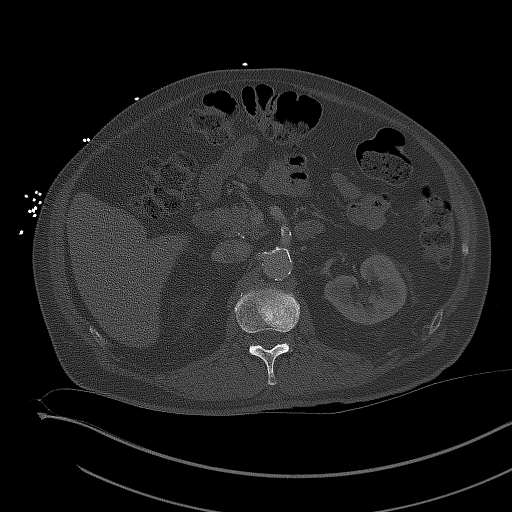
[im 39/169  bone]
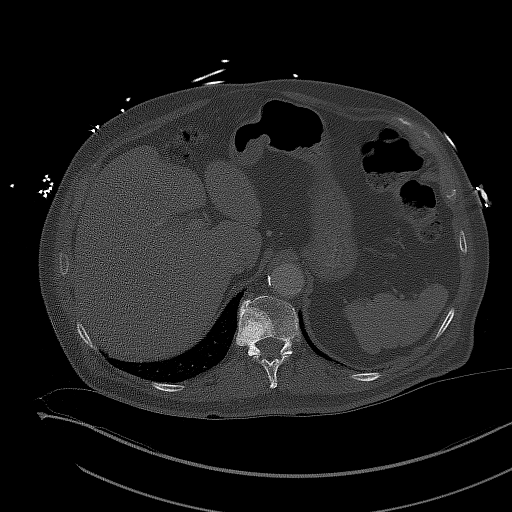
[im 52/169  bone]
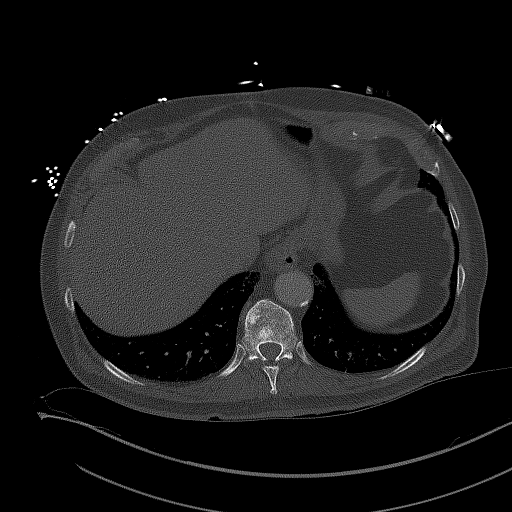

[Series 7: coronals · coronal · 0.78mm/px · 3 of 152 slices shown]
[im 31/152  mediastinal]
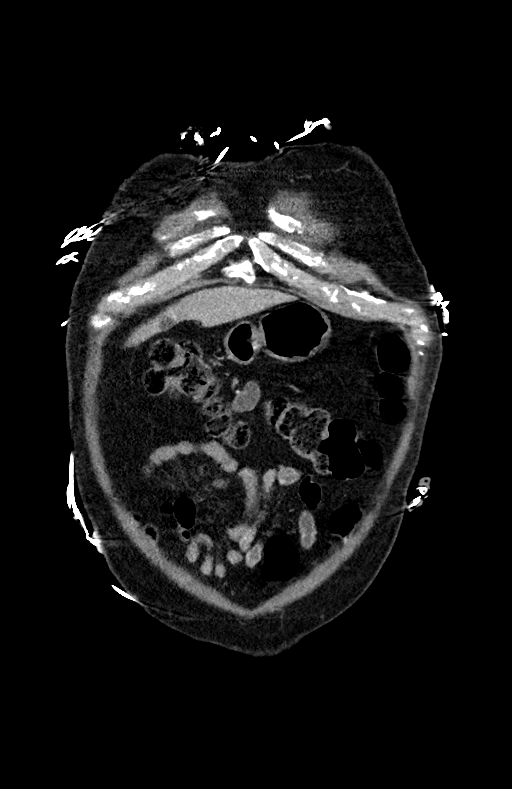
[im 61/152  mediastinal]
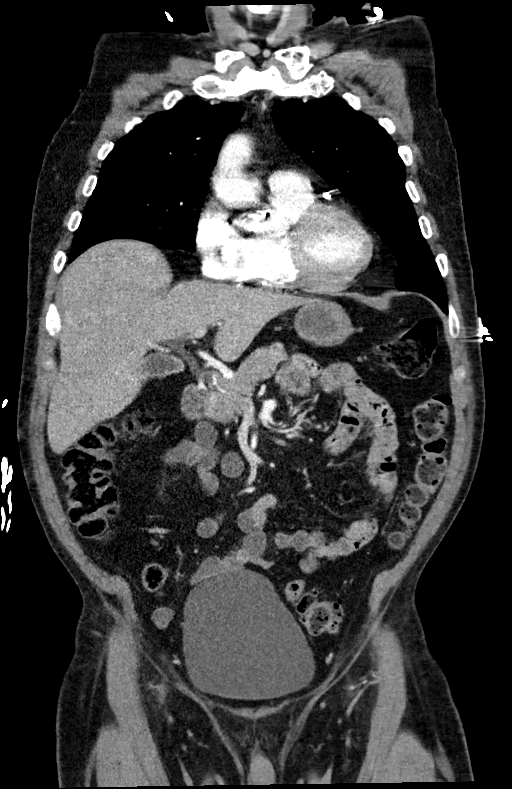
[im 91/152  mediastinal]
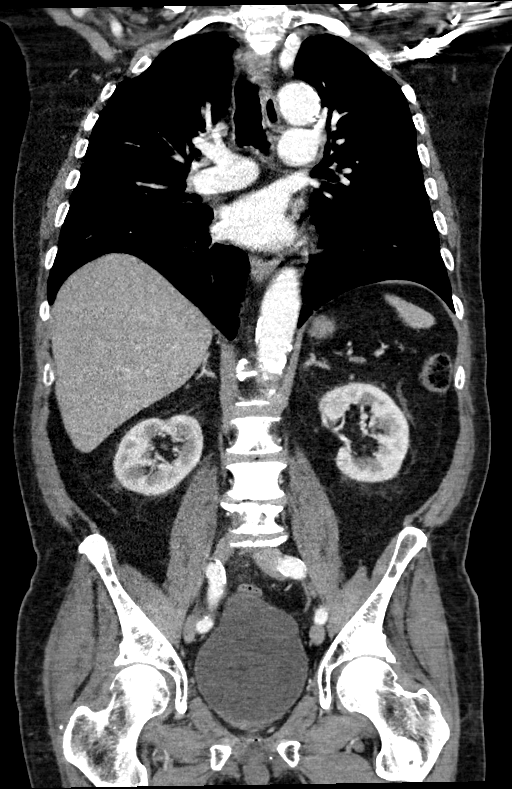

[15 of 36 positions shown; findings below may reference images not displayed]

FINDINGS: CT CHEST FINDINGS

Cardiovascular: Atherosclerotic calcification of the aorta, aortic
valve and coronary arteries. Heart size within normal limits. No
pericardial effusion.

Mediastinum/Nodes: No pathologically enlarged mediastinal, hilar or
axillary lymph nodes. Esophagus is unremarkable.

Lungs/Pleura: Image quality is degraded by respiratory motion and
expiratory phase imaging. Calcified granuloma in the right upper
lobe. Paraspinal scarring/atelectasis in the medial right lower
lobe. Probable nodular scarring in the apex of the left upper lobe
([DATE]). New airspace consolidation and ground-glass in the superior
segment left lower lobe. Minimal peribronchovascular ground-glass in
the lingula. No pleural fluid. Airway is unremarkable.

Musculoskeletal: Sclerotic lesions are seen throughout the
visualized osseous structures.

CT ABDOMEN PELVIS FINDINGS

Hepatobiliary: Subcentimeter low-attenuation lesion in the left
hepatic lobe is too small to characterize but unchanged. Liver and
gallbladder are unremarkable. No biliary ductal dilatation.

Pancreas: Negative.

Spleen: Negative.

Adrenals/Urinary Tract: Adrenal glands and right kidney are
unremarkable. Subcentimeter low-attenuation lesion in the left
kidney is too small to characterize but statistically, a cyst is
likely. Ureters are decompressed. TURP defect in the bladder.
Bladder is otherwise grossly unremarkable.

Stomach/Bowel: Small hiatal hernia. Stomach, small bowel, appendix
in colon are otherwise unremarkable.

Vascular/Lymphatic: Atherosclerotic calcification of the aorta. No
pathologically enlarged lymph nodes.

Reproductive: Prostatectomy.

Other: No free fluid. Mesenteries and peritoneum are unremarkable.
Tiny umbilical hernia contains fat.

Musculoskeletal: Sclerotic lesions are again seen throughout the
visualized osseous structures.
IMPRESSION: 1. New left lower lobe pneumonia.
2. Diffuse osseous metastatic disease.
3. Aortic atherosclerosis ([0F]-[0F]). Coronary artery
calcification.

## 2020-04-04 MED ORDER — IPRATROPIUM-ALBUTEROL 0.5-2.5 (3) MG/3ML IN SOLN
3.0000 mL | Freq: Three times a day (TID) | RESPIRATORY_TRACT | Status: DC
  Administered 2020-04-04: 3 mL via RESPIRATORY_TRACT
  Filled 2020-04-04 (×2): qty 3

## 2020-04-04 MED ORDER — ACETAMINOPHEN 325 MG PO TABS
650.0000 mg | ORAL_TABLET | Freq: Four times a day (QID) | ORAL | Status: DC | PRN
  Administered 2020-04-04 – 2020-04-09 (×9): 650 mg via ORAL
  Filled 2020-04-04 (×11): qty 2

## 2020-04-04 MED ORDER — SODIUM CHLORIDE 0.9 % IV BOLUS
500.0000 mL | Freq: Once | INTRAVENOUS | Status: AC
  Administered 2020-04-04: 500 mL via INTRAVENOUS

## 2020-04-04 MED ORDER — POLYETHYLENE GLYCOL 3350 17 G PO PACK
17.0000 g | PACK | Freq: Every day | ORAL | Status: DC
  Administered 2020-04-05 – 2020-04-09 (×4): 17 g via ORAL
  Filled 2020-04-04 (×7): qty 1

## 2020-04-04 MED ORDER — GABAPENTIN 300 MG PO CAPS
300.0000 mg | ORAL_CAPSULE | Freq: Three times a day (TID) | ORAL | Status: DC
  Administered 2020-04-04 – 2020-04-09 (×13): 300 mg via ORAL
  Filled 2020-04-04 (×8): qty 1
  Filled 2020-04-04: qty 3
  Filled 2020-04-04: qty 1

## 2020-04-04 MED ORDER — IPRATROPIUM-ALBUTEROL 0.5-2.5 (3) MG/3ML IN SOLN
3.0000 mL | Freq: Three times a day (TID) | RESPIRATORY_TRACT | Status: DC
  Administered 2020-04-04 – 2020-04-09 (×12): 3 mL via RESPIRATORY_TRACT
  Filled 2020-04-04 (×10): qty 3

## 2020-04-04 MED ORDER — TAMSULOSIN HCL 0.4 MG PO CAPS
0.4000 mg | ORAL_CAPSULE | Freq: Every day | ORAL | Status: DC
Start: 2020-04-04 — End: 2020-04-09
  Administered 2020-04-05 – 2020-04-09 (×4): 0.4 mg via ORAL
  Filled 2020-04-04 (×7): qty 1

## 2020-04-04 MED ORDER — IOHEXOL 300 MG/ML  SOLN
100.0000 mL | Freq: Once | INTRAMUSCULAR | Status: AC | PRN
  Administered 2020-04-04: 100 mL via INTRAVENOUS

## 2020-04-04 MED ORDER — FENTANYL 25 MCG/HR TD PT72
1.0000 | MEDICATED_PATCH | TRANSDERMAL | Status: DC
  Administered 2020-04-04 – 2020-04-07 (×2): 1 via TRANSDERMAL
  Filled 2020-04-04 (×3): qty 1

## 2020-04-04 MED ORDER — SENNOSIDES-DOCUSATE SODIUM 8.6-50 MG PO TABS
2.0000 | ORAL_TABLET | Freq: Every day | ORAL | Status: DC
  Administered 2020-04-04 – 2020-04-08 (×5): 2 via ORAL
  Filled 2020-04-04 (×8): qty 2

## 2020-04-04 MED ORDER — SODIUM CHLORIDE 0.9 % IV SOLN
500.0000 mg | Freq: Once | INTRAVENOUS | Status: AC
  Administered 2020-04-04: 500 mg via INTRAVENOUS
  Filled 2020-04-04: qty 500

## 2020-04-04 MED ORDER — SODIUM CHLORIDE 0.9 % IV SOLN: INTRAVENOUS | Status: DC | PRN

## 2020-04-04 MED ORDER — HYDROMORPHONE HCL 4 MG PO TABS
4.0000 mg | ORAL_TABLET | Freq: Three times a day (TID) | ORAL | Status: DC | PRN
  Administered 2020-04-05 – 2020-04-09 (×11): 4 mg via ORAL
  Filled 2020-04-04: qty 1
  Filled 2020-04-04: qty 2
  Filled 2020-04-04 (×13): qty 1

## 2020-04-04 MED ORDER — CEFTRIAXONE SODIUM 2 G IJ SOLR
2.0000 g | INTRAMUSCULAR | Status: DC
  Administered 2020-04-05: 2 g via INTRAVENOUS
  Filled 2020-04-04: qty 20

## 2020-04-04 MED ORDER — AZITHROMYCIN 500 MG PO TABS
500.0000 mg | ORAL_TABLET | Freq: Every day | ORAL | Status: DC
  Administered 2020-04-05: 500 mg via ORAL
  Filled 2020-04-04 (×3): qty 1
  Filled 2020-04-04: qty 2

## 2020-04-04 MED ORDER — SODIUM CHLORIDE 0.9 % IV SOLN
1.0000 g | Freq: Once | INTRAVENOUS | Status: AC
  Administered 2020-04-04: 1 g via INTRAVENOUS
  Filled 2020-04-04: qty 10

## 2020-04-04 MED ORDER — GABAPENTIN 400 MG PO CAPS: 400.0000 mg | ORAL_CAPSULE | Freq: Three times a day (TID) | ORAL | Status: DC

## 2020-04-04 MED ORDER — PANTOPRAZOLE SODIUM 40 MG PO TBEC
40.0000 mg | DELAYED_RELEASE_TABLET | Freq: Every day | ORAL | Status: DC
  Administered 2020-04-04 – 2020-04-09 (×5): 40 mg via ORAL
  Filled 2020-04-04 (×8): qty 1

## 2020-04-04 MED ORDER — LACTATED RINGERS IV BOLUS
1000.0000 mL | Freq: Once | INTRAVENOUS | Status: AC
  Administered 2020-04-04: 1000 mL via INTRAVENOUS

## 2020-04-04 MED ORDER — ENOXAPARIN SODIUM 40 MG/0.4ML ~~LOC~~ SOLN
40.0000 mg | SUBCUTANEOUS | Status: DC
  Administered 2020-04-04 – 2020-04-08 (×5): 40 mg via SUBCUTANEOUS
  Filled 2020-04-04 (×5): qty 0.4

## 2020-04-04 NOTE — ED Triage Notes (Addendum)
Pt brought in by EMS due to weakness for 2 days. Reports left sided weakness is worse. Pt is unclear about how long he has been weak. Pt is coughing and has nasal congestion. Reports unsteady gate and fall

## 2020-04-04 NOTE — Evaluation (Signed)
Physical Therapy Evaluation Patient Details Name: Keith Olson. MRN: [id number] DOB: [birth year] Today's Date: 04/04/2020   History of Present Illness  Keith Olson. is a 80 y.o. 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     Patient presented via EMS today for concern of generalized weakness onset 2 days ago.  EMS has left prior to initial evaluation.  Per nursing report patient's vital signs and CBG were stable.  He is sent in for leg weakness bilaterally started 2 days ago, patient began to have upper extremity weakness beginning today.     Patient reports that he feels generally ill over the past 2 days he reports he is feeling weak but denies any focal or unilateral weakness.  He denies similar in the past.  He reports he has been compliant with all of his medications including his chemotherapy.  He reports he had 1 episode of emesis yesterday and has some mild generalized abdominal pain that does not radiate and there are no aggravating or alleviating factors that began 2 days ago as well.  He denies any fever, cough, chest pain/shortness of breath, numbness/tingling, weakness, dizziness or any additional concerns.    Clinical Impression  Patient limited for functional mobility as stated below secondary to BLE weakness, fatigue and impaired activity tolerance. Patient requires min assist to trasition to seated EOB. Patient demonstrates fair sitting tolerance and sitting balance but is limited overall by fatigue. O2 sat monitored in 90s while at rest which decreases to 78% with movement. Patient limited by fatigue today and requires assist for LE movement back into bed. Saturation increases to upper 90s with cueing for pursed lip breathing. Patient will benefit from continued physical therapy in hospital and recommended venue below to increase strength, balance, endurance for safe ADLs and gait.     Follow Up Recommendations SNF;Supervision/Assistance -  24 hour;Supervision for mobility/OOB    Equipment Recommendations  None recommended by PT    Recommendations for Other Services       Precautions / Restrictions Precautions Precautions: Fall Restrictions Weight Bearing Restrictions: No      Mobility  Bed Mobility Overal bed mobility: Needs Assistance Bed Mobility: Supine to Sit;Sit to Supine     Supine to sit: Min assist;HOB elevated Sit to supine: Min assist;HOB elevated   General bed mobility comments: Patient requires assist to pull to seated EOB with HOB elevated, requires assist for LE back into bed    Transfers                    Ambulation/Gait                Stairs            Wheelchair Mobility    Modified Rankin (Stroke Patients Only)       Balance Overall balance assessment: Needs assistance Sitting-balance support: Feet unsupported;Single extremity supported Sitting balance-Leahy Scale: Fair Sitting balance - Comments: seated EOB                                     Pertinent Vitals/Pain Pain Assessment: 0-10 Pain Score: 8  Pain Location: L arm, R leg Pain Intervention(s): Limited activity within patient's tolerance;Monitored during session;Repositioned    Home Living Family/patient expects to be discharged to:: Private residence Living Arrangements: Children Available Help at Discharge: Family Type of Home: House Home Access: Stairs to enter Entrance  Stairs-Rails: Right;Left;Can reach both Entrance Stairs-Number of Steps: 3 Home Layout: Two level;Able to live on main level with bedroom/bathroom Home Equipment: Walker - 4 wheels;Cane - single point Additional Comments: Patient states he lives with his son    Prior Function Level of Independence: Needs assistance   Gait / Transfers Assistance Needed: household ambulator with rollator, has had several falls recently when not using AD  ADL's / Homemaking Assistance Needed: family assists PRN,  independent with basic ADL        Hand Dominance        Extremity/Trunk Assessment   Upper Extremity Assessment Upper Extremity Assessment: Generalized weakness    Lower Extremity Assessment Lower Extremity Assessment: Generalized weakness    Cervical / Trunk Assessment Cervical / Trunk Assessment: Normal  Communication   Communication: No difficulties  Cognition Arousal/Alertness: Awake/alert Behavior During Therapy: WFL for tasks assessed/performed Overall Cognitive Status: Within Functional Limits for tasks assessed                                        General Comments      Exercises     Assessment/Plan    PT Assessment Patient needs continued PT services  PT Problem List Decreased strength;Decreased range of motion;Decreased activity tolerance;Decreased balance;Decreased mobility;Decreased knowledge of use of DME;Cardiopulmonary status limiting activity;Pain       PT Treatment Interventions DME instruction;Gait training;Stair training;Functional mobility training;Therapeutic activities;Therapeutic exercise;Balance training;Neuromuscular re-education;Patient/family education    PT Goals (Current goals can be found in the Care Plan section)  Acute Rehab PT Goals Patient Stated Goal: Return home PT Goal Formulation: With patient Time For Goal Achievement: 04/18/20 Potential to Achieve Goals: Fair    Frequency Min 3X/week   Barriers to discharge        Co-evaluation               AM-PAC PT "6 Clicks" Mobility  Outcome Measure Help needed turning from your back to your side while in a flat bed without using bedrails?: None Help needed moving from lying on your back to sitting on the side of a flat bed without using bedrails?: A Little Help needed moving to and from a bed to a chair (including a wheelchair)?: A Little Help needed standing up from a chair using your arms (e.g., wheelchair or bedside chair)?: A Lot Help needed to  walk in hospital room?: A Lot Help needed climbing 3-5 steps with a railing? : A Lot 6 Click Score: 16    End of Session Equipment Utilized During Treatment: Oxygen Activity Tolerance: Patient limited by fatigue Patient left: in bed;with call bell/phone within reach   PT Visit Diagnosis: Unsteadiness on feet (R26.81);Other abnormalities of gait and mobility (R26.89);Muscle weakness (generalized) (M62.81);History of falling (Z91.81)    Time: 8295-6213 PT Time Calculation (min) (ACUTE ONLY): 12 min   Charges:   PT Evaluation $PT Eval Moderate Complexity: 1 Mod          3:38 PM, 04/04/20 Mearl Latin PT, DPT Physical Therapist at [facility]

## 2020-04-04 NOTE — Plan of Care (Signed)
  Problem: Acute Rehab PT Goals(only PT should resolve) Goal: Pt Will Go Supine/Side To Sit Outcome: Progressing Flowsheets (Taken 04/04/2020 1540) Pt will go Supine/Side to Sit: with supervision Goal: Pt Will Go Sit To Supine/Side Outcome: Progressing Flowsheets (Taken 04/04/2020 1540) Pt will go Sit to Supine/Side: with supervision Goal: Patient Will Transfer Sit To/From Stand Outcome: Progressing Flowsheets (Taken 04/04/2020 1540) Patient will transfer sit to/from stand: with minimal assist Goal: Pt Will Transfer Bed To Chair/Chair To Bed Outcome: Progressing Flowsheets (Taken 04/04/2020 1540) Pt will Transfer Bed to Chair/Chair to Bed: with min assist Goal: Pt Will Ambulate Outcome: Progressing Flowsheets (Taken 04/04/2020 1540) Pt will Ambulate:  25 feet  with least restrictive assistive device  with minimal assist   3:41 PM, 04/04/20 Mearl Latin PT, DPT Physical Therapist at [facility]

## 2020-04-04 NOTE — ED Provider Notes (Signed)
[facility] EMERGENCY DEPARTMENT Provider Note   CSN: [id number] Arrival date & time: 04/04/20  0930     History Chief Complaint  Patient presents with  . Weakness    Keith Olson. is a 80 y.o. 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  Patient presented via EMS today for concern of generalized weakness onset 2 days ago.  EMS has left prior to initial evaluation.  Per nursing report patient's vital signs and CBG were stable.  He is sent in for leg weakness bilaterally started 2 days ago, patient began to have upper extremity weakness beginning today.  Patient reports that he feels generally ill over the past 2 days he reports he is feeling weak but denies any focal or unilateral weakness.  He denies similar in the past.  He reports he has been compliant with all of his medications including his chemotherapy.  He reports he had 1 episode of emesis yesterday and has some mild generalized abdominal pain that does not radiate and there are no aggravating or alleviating factors that began 2 days ago as well.  He denies any fever, cough, chest pain/shortness of breath, numbness/tingling, weakness, dizziness or any additional concerns.  HPI     Past Medical History:  Diagnosis Date  . Agent orange exposure   . Arthritis   . Coronary artery disease    BMS to mid LAD 2001, DES to proximal LAD 2017  . Enlarged prostate    XRT 2016  . Headache    Sinus headaches   . Heart abnormality   . History of depression   . Hyperlipidemia   . Hypothyroidism   . Prostate cancer ([place]) 07/2014   hormonal therapy, external beam radiation therapy  . PTSD (post-traumatic stress disorder)     Patient Active Problem List   Diagnosis Date Noted  . Weakness 07/22/2019  . Acute lower UTI 07/22/2019  . Protein calorie malnutrition ([place]) 07/22/2019  . Urinary retention 07/18/2019  . Hyponatremia 06/29/2019  . Acute bilateral low back pain with sciatica    . Multiple lung nodules on CT   . 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 Unstable angina ([place])   . History of depression 09/05/2017  . Hypothyroidism 09/05/2017  . Depression, major, single episode, moderate ([place]) 11/26/2016  . Prostatitis 11/26/2016  . Rectal bleeding 02/25/2016  . Pain management contract signed 01/27/2016  . Constipation 04/22/2015  . Lower abdominal pain 04/22/2015  . Anxiety state 12/16/2014  . 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 Hyperlipidemia 05/22/2012  . BPH (benign prostatic hyperplasia) 05/22/2012  . Chest pain 02/24/2012  . CAD (coronary artery disease) 02/24/2012    Past Surgical History:  Procedure Laterality Date  . BRONCHIAL BRUSHINGS  07/02/2019   Procedure: BRONCHIAL BRUSHINGS;  Surgeon: Rigoberto Noel, MD;  Location: [facility];  Service: Cardiopulmonary;;  . BRONCHIAL NEEDLE ASPIRATION BIOPSY  07/02/2019   Procedure: BRONCHIAL NEEDLE ASPIRATION BIOPSIES;  Surgeon: Rigoberto Noel, MD;  Location: [facility];  Service: Cardiopulmonary;;  . BRONCHIAL WASHINGS  07/02/2019   Procedure: BRONCHIAL WASHINGS;  Surgeon: Rigoberto Noel, MD;  Location: [facility];  Service: Cardiopulmonary;;  . CARDIAC CATHETERIZATION N/A 12/12/2015   Procedure: Left Heart Cath and Coronary Angiography;  Surgeon: Peter M Martinique, MD;  Location: [place] [facility];  Service: Cardiovascular;  Laterality: N/A;  . CARDIAC CATHETERIZATION N/A 12/12/2015   Procedure:  Coronary Stent Intervention;  Surgeon: Peter M Martinique, MD;  Location: [place] [facility];  Service: Cardiovascular;  Laterality: N/A;  . CORONARY STENT INTERVENTION N/A 09/06/2017   Procedure: CORONARY STENT INTERVENTION;  Surgeon: Burnell Blanks, MD;  Location: [place] [facility];  Service: Cardiovascular;  Laterality: N/A;  . ENDOBRONCHIAL ULTRASOUND N/A 07/02/2019   Procedure: ENDOBRONCHIAL  ULTRASOUND;  Surgeon: Rigoberto Noel, MD;  [facility]: [facility];  Service: Cardiopulmonary;  Laterality: N/A;  . FLEXIBLE BRONCHOSCOPY  07/02/2019   Procedure: FLEXIBLE BRONCHOSCOPY;  Surgeon: Rigoberto Noel, MD;  [facility];  Service: Cardiopulmonary;;  . HERNIA REPAIR Right   . LEFT HEART CATH AND CORONARY ANGIOGRAPHY N/A 09/02/2016   Procedure: Left Heart Cath and Coronary Angiography;  Surgeon: Leonie Man, MD;  Location: [place] [facility];  Service: Cardiovascular;  Laterality: N/A;  . LEFT HEART CATH AND CORONARY ANGIOGRAPHY N/A 09/06/2017   Procedure: LEFT HEART CATH AND CORONARY ANGIOGRAPHY;  Surgeon: Burnell Blanks, MD;  Location: [place] [facility];  Service: Cardiovascular;  Laterality: N/A;  . LEFT HEART CATH AND CORONARY ANGIOGRAPHY N/A 05/04/2019   Procedure: LEFT HEART CATH AND CORONARY ANGIOGRAPHY;  Surgeon: Martinique, Peter M, MD;  Location: [place] [facility];  Service: Cardiovascular;  Laterality: N/A;  . PROSTATE BIOPSY N/A 08/28/2014   Procedure: BIOPSY TRANSRECTAL ULTRASONIC PROSTATE (TUBP);  Surgeon: Rana Snare, MD;  Location: [facility];  Service: Urology;  Laterality: N/A;  . TRANSURETHRAL RESECTION OF PROSTATE N/A 08/28/2014   Procedure: TRANSURETHRAL RESECTION OF THE PROSTATE WITH GYRUS INSTRUMENTS;  Surgeon: Rana Snare, MD;  Location: [facility];  Service: Urology;  Laterality: N/A;       Family History  Problem Relation Age of Onset  . Arthritis Mother   . Heart attack Father     Social History   Tobacco Use  . Smoking status: Never Smoker  . Smokeless tobacco: Former Systems developer    Types: Secondary school teacher  . Vaping Use: Never used  Substance Use Topics  . Alcohol use: No  . Drug use: No    Home Medications Prior to Admission medications   Medication Sig Start Date End Date Taking? Authorizing Provider  abiraterone acetate (ZYTIGA) 250 MG tablet TAKE 4 TABLETS (1,000 MG TOTAL) BY MOUTH DAILY. TAKE ON AN EMPTY STOMACH 1 HOUR BEFORE OR 2 HOURS  AFTER A MEAL 10/18/19   Derek Jack, MD  abiraterone acetate (ZYTIGA) 250 MG tablet TAKE 4 TABLETS (1,000 MG TOTAL) BY MOUTH DAILY. TAKE ON AN EMPTY STOMACH 1 HOUR BEFORE OR 2 HOURS AFTER A MEAL 12/10/19   Derek Jack, MD  ALPRAZolam Duanne Moron) 0.5 MG tablet Take 1 tablet (0.5 mg total) by mouth 2 (two) times daily as needed for anxiety. 07/25/19   Johnson, Clanford L, MD  aspirin EC 81 MG tablet Take 1 tablet (81 mg total) by mouth daily. 12/09/15   Satira Sark, MD  cetirizine (ZYRTEC) 10 MG tablet Take 10 mg by mouth daily. 12/26/19   [provider]  dexamethasone (DECADRON) 4 MG tablet Take 1 tablet (4 mg total) by mouth 2 (two) times daily with a meal. 07/06/19   Rai, Ripudeep K, MD  diclofenac sodium (VOLTAREN) 1 % GEL APPLY 2 GRAMS TOPICALLY 4 TIMES DAILY 12/21/18   Dettinger, Fransisca Kaufmann, MD  dronabinol (MARINOL) 2.5 MG capsule Take 1 capsule (2.5 mg total) by mouth 2 (two) times daily before a meal. 12/06/19   Derek Jack, MD  DULoxetine (CYMBALTA) 9  MG capsule Take 60 mg by mouth daily. 08/09/19   [provider]  fentaNYL (DURAGESIC) 25 MCG/HR Place 1 patch onto the skin every 3 (three) days. 03/11/20   Jacquelin Hawking, NP  fluticasone (FLONASE) 50 MCG/ACT nasal spray Place into both nostrils. 12/26/19   [provider]  folic acid (FOLVITE) 1 MG tablet Take 1 tablet (1 mg total) by mouth daily. 07/26/19   Johnson, Clanford L, MD  furosemide (LASIX) 20 MG tablet Take 20 mg by mouth 2 (two) times daily.  09/11/19   [provider]  gabapentin (NEURONTIN) 400 MG capsule Take 1 capsule (400 mg total) by mouth 3 (three) times daily. 03/27/20   Derek Jack, MD  HYDROmorphone (DILAUDID) 4 MG tablet Take 1 tablet (4 mg total) by mouth every 8 (eight) hours as needed for severe pain. 03/27/20   Derek Jack, MD  lisinopril (PRINIVIL) 10 MG tablet Take 1 tablet (10 mg total) by mouth daily. 01/31/20   Derek Jack, MD   meclizine (ANTIVERT) 25 MG tablet Take 1 tablet (25 mg total) by mouth 2 (two) times daily as needed for dizziness. 07/06/19   Rai, Ripudeep Raliegh Ip, MD  NARCAN 4 MG/0.1ML LIQD nasal spray kit CALL 911. ADMINISTER A SINGLE SPRAY OF NARCAN IN ONE NOSTRIL. REPEAT EVERY 3 MINUTES AS NEEDED IF NO OR MINIMAL RESPONSE. 08/10/19   [provider]  nitroGLYCERIN (NITROSTAT) 0.4 MG SL tablet Place 1 tablet (0.4 mg total) under the tongue every 5 (five) minutes as needed for chest pain. X 3 doses 09/07/17   Bhagat, Bhavinkumar, PA  pantoprazole (PROTONIX) 40 MG tablet Take 1 tablet (40 mg total) by mouth daily. 07/06/19   Rai, Vernelle Emerald, MD  polyethylene glycol powder (GLYCOLAX/MIRALAX) 17 GM/SCOOP powder Take 17 g by mouth daily as needed for mild constipation or moderate constipation.     [provider]  predniSONE (DELTASONE) 5 MG tablet Take 1 tablet (5 mg total) by mouth daily with breakfast. 10/12/19   Derek Jack, MD  tamsulosin (FLOMAX) 0.4 MG CAPS capsule Take 0.4 mg by mouth daily. 08/04/19   [provider]  thiamine 100 MG tablet Take 1 tablet (100 mg total) by mouth daily. 07/26/19   Murlean Iba, MD    Allergies    Lipitor [atorvastatin]  Review of Systems   Review of Systems Ten systems are reviewed and are negative for acute change except as noted in the HPI Physical Exam Updated Vital Signs BP 130/63   Pulse 85   Temp 98.7 F (37.1 C) (Oral)   Resp 14   Ht _0  (1.676 m)   Wt 73 kg   SpO2 96%   BMI 25.98 kg/m   Physical Exam Constitutional:      General: He is not in acute distress.    Appearance: Normal appearance. He is well-developed. He is ill-appearing. He is not toxic-appearing or diaphoretic.  HENT:     Head: Normocephalic and atraumatic.  Eyes:     General: Vision grossly intact. Gaze aligned appropriately.     Pupils: Pupils are equal, round, and reactive to light.  Neck:     Trachea: Trachea and phonation normal.   Cardiovascular:     Rate and Rhythm: Normal rate and regular rhythm.  Pulmonary:     Effort: Pulmonary effort is normal. No respiratory distress.     Breath sounds: Normal breath sounds.  Abdominal:     General: There is no distension.     Palpations:  Abdomen is soft.     Tenderness: There is no abdominal tenderness. There is no guarding or rebound.  Musculoskeletal:        General: Normal range of motion.     Cervical back: Normal range of motion.  Skin:    General: Skin is warm and dry.  Neurological:     Mental Status: He is alert.     GCS: GCS eye subscore is 4. GCS verbal subscore is 5. GCS motor subscore is 6.     Comments: Speech is clear and goal oriented, follows commands Question left mouth droop when resting but this resolves with smile.  Otherwise no cranial nerve deficits.   Generally weak in all 4 extremities but no unilateral weakness.  Sensation normal to light and sharp touch Moves extremities without ataxia, coordination intact  Psychiatric:        Behavior: Behavior normal.     ED Results / Procedures / Treatments   Labs (all labs ordered are listed, but only abnormal results are displayed) Labs Reviewed  CBC WITH DIFFERENTIAL/PLATELET - Abnormal; Notable for the following components:      Result Value   WBC 15.6 (*)    RBC 4.02 (*)    Hemoglobin 12.6 (*)    Neutro Abs 12.2 (*)    Monocytes Absolute 1.2 (*)    Eosinophils Absolute 0.7 (*)    All other components within normal limits  COMPREHENSIVE METABOLIC PANEL - Abnormal; Notable for the following components:   Sodium 134 (*)    Glucose, Bld 112 (*)    Calcium 8.7 (*)    All other components within normal limits  CBG MONITORING, ED - Abnormal; Notable for the following components:   Glucose-Capillary 117 (*)    All other components within normal limits  SARS CORONAVIRUS 2 BY RT PCR (HOSPITAL ORDER, [place] [facility])  URINE CULTURE  CULTURE, BLOOD (SINGLE) W REFLEX TO ID  PANEL  LACTIC ACID, PLASMA  PROTIME-INR  APTT  CBC WITH DIFFERENTIAL/PLATELET  URINALYSIS, ROUTINE W REFLEX MICROSCOPIC  I-STAT CHEM 8, ED  TROPONIN I (HIGH SENSITIVITY)  TROPONIN I (HIGH SENSITIVITY)    EKG EKG Interpretation  Date/Time:  Friday April 04 2020 09:42:20 EST Ventricular Rate:  95 PR Interval:    QRS Duration: 90 QT Interval:  343 QTC Calculation: 432 R Axis:   -59 Text Interpretation: Sinus rhythm Left anterior fascicular block Abnormal R-wave progression, late transition No STEMI Confirmed by Octaviano Glow [phone]) on 04/04/2020 10:10:38 AM   Radiology CT Head Wo Contrast  Result Date: 04/04/2020 CLINICAL DATA:  Keith Olson lysed weakness EXAM: CT HEAD WITHOUT CONTRAST TECHNIQUE: Contiguous axial images were obtained from the base of the skull through the vertex without intravenous contrast. COMPARISON:  Jul 06, 2019 FINDINGS: Brain: No evidence of acute infarction, hemorrhage, hydrocephalus, extra-axial collection or mass lesion/mass effect. Global parenchymal volume loss. Periventricular white matter hypodensities consistent with sequela of chronic microvascular ischemic disease. Vascular: No hyperdense vessel or unexpected calcification. Skull: Normal. Negative for fracture or focal lesion. Sinuses/Orbits: Air-fluid levels within the sphenoid and RIGHT maxillary sinus. Mucosal thickening of multiple ethmoid air cells. Other: None. IMPRESSION: 1. No acute intracranial abnormality. 2. Air-fluid levels within the sphenoid and RIGHT maxillary sinus. This could reflect acute sinusitis in the appropriate clinical setting. Electronically Signed   By: Valentino Saxon MD   On: 04/04/2020 12:41   CT CHEST ABDOMEN PELVIS W CONTRAST  Result Date: 04/04/2020 CLINICAL DATA:  Weakness, cough, nasal congestion,  abdominal pain and emesis. EXAM: CT CHEST, ABDOMEN, AND PELVIS WITH CONTRAST TECHNIQUE: Multidetector CT imaging of the chest, abdomen and pelvis was performed following the  standard protocol during bolus administration of intravenous contrast. CONTRAST:  133m OMNIPAQUE IOHEXOL 300 MG/ML  SOLN COMPARISON:  PET 03/03/2020. FINDINGS: CT CHEST FINDINGS Cardiovascular: Atherosclerotic calcification of the aorta, aortic valve and coronary arteries. Heart size within normal limits. No pericardial effusion. Mediastinum/Nodes: No pathologically enlarged mediastinal, hilar or axillary lymph nodes. Esophagus is unremarkable. Lungs/Pleura: Image quality is degraded by respiratory motion and expiratory phase imaging. Calcified granuloma in the right upper lobe. Paraspinal scarring/atelectasis in the medial right lower lobe. Probable nodular scarring in the apex of the left upper lobe (4/13). New airspace consolidation and ground-glass in the superior segment left lower lobe. Minimal peribronchovascular ground-glass in the lingula. No pleural fluid. Airway is unremarkable. Musculoskeletal: Sclerotic lesions are seen throughout the visualized osseous structures. CT ABDOMEN PELVIS FINDINGS Hepatobiliary: Subcentimeter low-attenuation lesion in the left hepatic lobe is too small to characterize but unchanged. Liver and gallbladder are unremarkable. No biliary ductal dilatation. Pancreas: Negative. Spleen: Negative. Adrenals/Urinary Tract: Adrenal glands and right kidney are unremarkable. Subcentimeter low-attenuation lesion in the left kidney is too small to characterize but statistically, a cyst is likely. Ureters are decompressed. TURP defect in the bladder. Bladder is otherwise grossly unremarkable. Stomach/Bowel: Small hiatal hernia. Stomach, small bowel, appendix in colon are otherwise unremarkable. Vascular/Lymphatic: Atherosclerotic calcification of the aorta. No pathologically enlarged lymph nodes. Reproductive: Prostatectomy. Other: No free fluid. Mesenteries and peritoneum are unremarkable. Tiny umbilical hernia contains fat. Musculoskeletal: Sclerotic lesions are again seen throughout the  visualized osseous structures. IMPRESSION: 1. New left lower lobe pneumonia. 2. Diffuse osseous metastatic disease. 3. Aortic atherosclerosis (ICD10-I70.0). Coronary artery calcification. Electronically Signed   By: MLorin PicketM.D.   On: 04/04/2020 12:45   DG Chest Port 1 View  Result Date: 04/04/2020 CLINICAL DATA:  Questionable sepsis EXAM: PORTABLE CHEST 1 VIEW COMPARISON:  07/20/2019, CT chest, 06/27/2019 FINDINGS: Low volume AP portable examination with mild diffuse interstitial pulmonary opacity. Redemonstrated pulmonary nodule of the left upper lobe. The heart and mediastinum are unremarkable. Osseous structures are unremarkable. IMPRESSION: 1. Low volume AP portable examination with mild diffuse interstitial pulmonary opacity, likely edema. This appearance may be exaggerated by low volume technique. 2. Redemonstrated pulmonary nodule of the left upper lobe, better assessed by prior CT. Electronically Signed   By: AEddie CandleM.D.   On: 04/04/2020 10:40    Procedures Procedures   Medications Ordered in ED Medications  lactated ringers bolus 1,000 mL (has no administration in time range)  cefTRIAXone (ROCEPHIN) 1 g in sodium chloride 0.9 % 100 mL IVPB (has no administration in time range)  azithromycin (ZITHROMAX) 500 mg in sodium chloride 0.9 % 250 mL IVPB (has no administration in time range)  sodium chloride 0.9 % bolus 500 mL (0 mLs Intravenous Stopped 04/04/20 1053)  iohexol (OMNIPAQUE) 300 MG/ML solution 100 mL (100 mLs Intravenous Contrast Given 04/04/20 1220)    ED Course  I have reviewed the triage vital signs and the nursing notes.  Pertinent labs & imaging results that were available during my care of the patient were reviewed by me and considered in my medical decision making (see chart for details).  Clinical Course as of 04/04/20 1330  Fri Apr 04, 2020  1321 80yo male here with weakness and hypoxia and SOB for 2 days, also reporting headache and muscle weakness.  Here  he was  found to have a new LLL PNA, WBC 15.6, lactate normal at 0.9.  CTH negative.  He has no focal neuro deficits but describes generalized arm and leg weakness, "low energy."  Clinically this is consistent with an infection - treatment for CAP.  Covid negative.   [MT]  [place] Dr Wynetta Emery [BM]    Clinical Course User Index [BM] Deliah Boston, PA-C [MT] Langston Masker Carola Rhine, MD   MDM Rules/Calculators/A&P                         Additional history obtained from: 1. Nursing notes from this visit. 2. EMR Review. ----------------- CBC shows dexterities of 13.6, mild anemia at 12.6. Covid test negative.   High-sensitivity troponin within normal limits.   CMP without emergent electrolyte derangement, AKI, LFTs or gap. CBG 117 Lactic 0.9 Blood cultures pending Urinalysis pending  EKG: Sinus rhythm Left anterior fascicular block Abnormal R-wave progression, late transition No STEMI Confirmed by Octaviano Glow [phone]) on 04/04/2020 10:10:38 AM  CXR:  IMPRESSION:  1. Low volume AP portable examination with mild diffuse interstitial  pulmonary opacity, likely edema. This appearance may be exaggerated  by low volume technique.  2. Redemonstrated pulmonary nodule of the left upper lobe, better  assessed by prior CT.   CT Head:  IMPRESSION:  1. No acute intracranial abnormality.  2. Air-fluid levels within the sphenoid and RIGHT maxillary sinus.  This could reflect acute sinusitis in the appropriate clinical  setting.   CT Chest/ABD/Pelvis:  IMPRESSION:  1. New left lower lobe pneumonia.  2. Diffuse osseous metastatic disease.  3. Aortic atherosclerosis (ICD10-I70.0). Coronary artery  calcification.  - Patient reassessed multiple times remains hemodynamically stable.  SPO2 90-91% on room air while lying but noted to drop into the 80s with exertion.  Patient be placed on 2 L nasal cannula.  Given patient's immunocompromise status and borderline low oxygenation feel patient would  benefit from IV antibiotics and hospitalization for treatment of community-acquired pneumonia.  Plan of care discussed with patient and he is in agreement.  Consult placed to hospitalist team. - Informed by RN Keith Olson that patient's son was concerned that he is being "slipped heroin" from a family member.  Will relay this information to hospital team.  Patient does not appear to be intoxicated. - 1:23 PM: Consult with Dr. Wynetta Emery, patient accepted to hospitalist service.   Keith Olson. was evaluated in Emergency Department on 04/04/2020 for the symptoms described in the history of present illness. He was evaluated in the context of the global COVID-19 pandemic, which necessitated consideration that the patient might be at risk for infection with the SARS-CoV-2 virus that causes COVID-19. Institutional protocols and algorithms that pertain to the evaluation of patients at risk for COVID-19 are in a state of rapid change based on information released by regulatory bodies including the CDC and federal and state organizations. These policies and algorithms were followed during the patient's care in the ED.  Note: Portions of this report may have been transcribed using voice recognition software. Every effort was made to ensure accuracy; however, inadvertent computerized transcription errors may still be present. Final Clinical Impression(s) / ED Diagnoses Final diagnoses:  Cough    Rx / DC Orders ED Discharge Orders    None       Gari Crown 04/04/20 1331    Wyvonnia Dusky, MD 04/04/20 [id number]

## 2020-04-04 NOTE — H&P (Signed)
History and Physical  [place]. DEY:[id number] DOB: 1941/01/04 DOA: 04/04/2020  PCP: Dettinger, Fransisca Kaufmann, MD  Patient coming from: Home  Level of care: Med-Surg  I have personally briefly reviewed patient's old medical records in [place]  Chief Complaint: weakness    HPI: Keith Olson. is a 80 y.o. 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  He had one episode of emesis yesterday and generalized abdominal pain.  He denies chest pain.  He denies fever chills and shortness of breath.  He denies dizziness.  He does report sinus pain and congestion sore throat postnasal drainage.  He does report cough. ED Course: Patient was afebrile arrival blood pressure 149/73 pulse 93 respirations 13 pulse ox 93% on 2 L of oxygen.  Sodium 134 potassium 3.8 glucose 112, high-sensitivity troponin X, lactic acid 0.9, WBC 15.6, hemoglobin 12.6.  CT of the abdomen and pelvis revealed new left lower lobe pneumonia.  CT head without contrast with findings of a right maxillary sinusitis.  Patient was admitted for community-acquired pneumonia.  SARS 2 coronavirus test negative.  Review of Systems: Review of Systems  Constitutional: Positive for fever and malaise/fatigue. Negative for chills.  HENT: Positive for congestion, ear discharge, sinus pain, sore throat and tinnitus.   Eyes: Negative.   Respiratory: Positive for cough, sputum production and shortness of breath.   Cardiovascular: Negative.   Gastrointestinal: Positive for constipation, heartburn and nausea. Negative for abdominal pain and vomiting.  Genitourinary: Negative.   Musculoskeletal: Positive for joint pain and myalgias.  Skin: Negative.   Neurological: Positive for weakness. Negative for dizziness, tingling, tremors and headaches.  Endo/Heme/Allergies:  Negative.   Psychiatric/Behavioral: Negative.   All other systems reviewed and are negative.    Past Medical History:  Diagnosis Date  . Agent orange exposure   . Arthritis   . Coronary artery disease    BMS to mid LAD 2001, DES to proximal LAD 2017  . Enlarged prostate    XRT 2016  . Headache    Sinus headaches   . Heart abnormality   . History of depression   . Hyperlipidemia   . Hypothyroidism   . Prostate cancer ([place]) 07/2014   hormonal therapy, external beam radiation therapy  . PTSD (post-traumatic stress disorder)     Past Surgical History:  Procedure Laterality Date  . BRONCHIAL BRUSHINGS  07/02/2019   Procedure: BRONCHIAL BRUSHINGS;  Surgeon: Rigoberto Noel, MD;  Location: [facility];  Service: Cardiopulmonary;;  . BRONCHIAL NEEDLE ASPIRATION BIOPSY  07/02/2019   Procedure: BRONCHIAL NEEDLE ASPIRATION BIOPSIES;  Surgeon: Rigoberto Noel, MD;  [facility]: [facility];  Service: Cardiopulmonary;;  . BRONCHIAL WASHINGS  07/02/2019   Procedure: BRONCHIAL WASHINGS;  Surgeon: Rigoberto Noel, MD;  [facility];  Service: Cardiopulmonary;;  . CARDIAC CATHETERIZATION N/A 12/12/2015   Procedure: Left Heart Cath and Coronary Angiography;  Surgeon: Peter M Martinique, MD;  Location: [place] [facility];  Service: Cardiovascular;  Laterality: N/A;  . CARDIAC CATHETERIZATION N/A 12/12/2015   Procedure: Coronary Stent Intervention;  Surgeon: Peter M Martinique, MD;  Location: [place] [facility];  Service: Cardiovascular;  Laterality: N/A;  . CORONARY STENT INTERVENTION N/A 09/06/2017   Procedure: CORONARY STENT INTERVENTION;  Surgeon: Burnell Blanks, MD;  Location: [place] [facility];  Service: Cardiovascular;  Laterality: N/A;  . ENDOBRONCHIAL  ULTRASOUND N/A 07/02/2019   Procedure: ENDOBRONCHIAL ULTRASOUND;  Surgeon: Rigoberto Noel, MD;  Location: [facility];  Service: Cardiopulmonary;  Laterality: N/A;  . FLEXIBLE BRONCHOSCOPY  07/02/2019   Procedure: FLEXIBLE BRONCHOSCOPY;   Surgeon: Rigoberto Noel, MD;  [facility];  Service: Cardiopulmonary;;  . HERNIA REPAIR Right   . LEFT HEART CATH AND CORONARY ANGIOGRAPHY N/A 09/02/2016   Procedure: Left Heart Cath and Coronary Angiography;  Surgeon: Leonie Man, MD;  Location: [place] [facility];  Service: Cardiovascular;  Laterality: N/A;  . LEFT HEART CATH AND CORONARY ANGIOGRAPHY N/A 09/06/2017   Procedure: LEFT HEART CATH AND CORONARY ANGIOGRAPHY;  Surgeon: Burnell Blanks, MD;  Location: [place] [facility];  Service: Cardiovascular;  Laterality: N/A;  . LEFT HEART CATH AND CORONARY ANGIOGRAPHY N/A 05/04/2019   Procedure: LEFT HEART CATH AND CORONARY ANGIOGRAPHY;  Surgeon: Martinique, Peter M, MD;  Location: [place] [facility];  Service: Cardiovascular;  Laterality: N/A;  . PROSTATE BIOPSY N/A 08/28/2014   Procedure: BIOPSY TRANSRECTAL ULTRASONIC PROSTATE (TUBP);  Surgeon: Rana Snare, MD;  [facility];  Service: Urology;  Laterality: N/A;  . TRANSURETHRAL RESECTION OF PROSTATE N/A 08/28/2014   Procedure: TRANSURETHRAL RESECTION OF THE PROSTATE WITH GYRUS INSTRUMENTS;  Surgeon: Rana Snare, MD;  Location: [facility];  Service: Urology;  Laterality: N/A;     reports that he has never smoked. He has quit using smokeless tobacco.  His smokeless tobacco use included chew. He reports that he does not drink alcohol and does not use drugs.  Allergies  Allergen Reactions  . Lipitor [Atorvastatin]     Muscles aches    Family History  Problem Relation Age of Onset  . Arthritis Mother   . Heart attack Father     Prior to Admission medications   Medication Sig Start Date End Date Taking? Authorizing Provider  tamsulosin (FLOMAX) 0.4 MG CAPS capsule Take 0.4 mg by mouth daily. 08/04/19  Yes [provider]  abiraterone acetate (ZYTIGA) 250 MG tablet TAKE 4 TABLETS (1,000 MG TOTAL) BY MOUTH DAILY. TAKE ON AN EMPTY STOMACH 1 HOUR BEFORE OR 2 HOURS AFTER A MEAL 10/18/19   Derek Jack, MD   abiraterone acetate (ZYTIGA) 250 MG tablet TAKE 4 TABLETS (1,000 MG TOTAL) BY MOUTH DAILY. TAKE ON AN EMPTY STOMACH 1 HOUR BEFORE OR 2 HOURS AFTER A MEAL 12/10/19   Derek Jack, MD  ALPRAZolam Duanne Moron) 0.5 MG tablet Take 1 tablet (0.5 mg total) by mouth 2 (two) times daily as needed for anxiety. 07/25/19   Alyxis Grippi L, MD  aspirin EC 81 MG tablet Take 1 tablet (81 mg total) by mouth daily. 12/09/15   Satira Sark, MD  cetirizine (ZYRTEC) 10 MG tablet Take 10 mg by mouth daily. 12/26/19   [provider]  dexamethasone (DECADRON) 4 MG tablet Take 1 tablet (4 mg total) by mouth 2 (two) times daily with a meal. Patient not taking: Reported on 04/04/2020 07/06/19   Rai, Vernelle Emerald, MD  diclofenac sodium (VOLTAREN) 1 % GEL APPLY 2 GRAMS TOPICALLY 4 TIMES DAILY Patient taking differently: Apply 2 g topically 4 (four) times daily. 12/21/18   Dettinger, Fransisca Kaufmann, MD  dronabinol (MARINOL) 2.5 MG capsule Take 1 capsule (2.5 mg total) by mouth 2 (two) times daily before a meal. 12/06/19   Derek Jack, MD  DULoxetine (CYMBALTA) 60 MG capsule Take 60 mg by mouth daily. Patient not taking: Reported on 04/04/2020 08/09/19   [provider]  fentaNYL (  DURAGESIC) 25 MCG/HR Place 1 patch onto the skin every 3 (three) days. 03/11/20   Jacquelin Hawking, NP  fluticasone (FLONASE) 50 MCG/ACT nasal spray Place 2 sprays into both nostrils daily. 12/26/19   [provider]  folic acid (FOLVITE) 1 MG tablet Take 1 tablet (1 mg total) by mouth daily. Patient not taking: Reported on 04/04/2020 07/26/19   Murlean Iba, MD  furosemide (LASIX) 20 MG tablet Take 20 mg by mouth 2 (two) times daily.  Patient not taking: Reported on 04/04/2020 09/11/19   [provider]  gabapentin (NEURONTIN) 400 MG capsule Take 1 capsule (400 mg total) by mouth 3 (three) times daily. 03/27/20   Derek Jack, MD  HYDROmorphone (DILAUDID) 4 MG tablet Take 1 tablet (4 mg total) by  mouth every 8 (eight) hours as needed for severe pain. 03/27/20   Derek Jack, MD  lisinopril (PRINIVIL) 10 MG tablet Take 1 tablet (10 mg total) by mouth daily. 01/31/20   Derek Jack, MD  meclizine (ANTIVERT) 25 MG tablet Take 1 tablet (25 mg total) by mouth 2 (two) times daily as needed for dizziness. Patient not taking: Reported on 04/04/2020 07/06/19   Rai, Vernelle Emerald, MD  [facility] 4 MG/0.1ML LIQD nasal spray kit Place 1 spray into the nose once. 08/10/19   [provider]  nitroGLYCERIN (NITROSTAT) 0.4 MG SL tablet Place 1 tablet (0.4 mg total) under the tongue every 5 (five) minutes as needed for chest pain. X 3 doses 09/07/17   Bhagat, Bhavinkumar, PA  pantoprazole (PROTONIX) 40 MG tablet Take 1 tablet (40 mg total) by mouth daily. Patient not taking: Reported on 04/04/2020 07/06/19   Rai, Vernelle Emerald, MD  polyethylene glycol powder (GLYCOLAX/MIRALAX) 17 GM/SCOOP powder Take 17 g by mouth daily as needed for mild constipation or moderate constipation.     [provider]  predniSONE (DELTASONE) 5 MG tablet Take 1 tablet (5 mg total) by mouth daily with breakfast. 10/12/19   Derek Jack, MD  thiamine 100 MG tablet Take 1 tablet (100 mg total) by mouth daily. 07/26/19   Murlean Iba, MD    Physical Exam: Vitals:   04/04/20 1325 04/04/20 1326 04/04/20 1327 04/04/20 1328  BP:   126/64   Pulse: 89 87 88 86  Resp: _0 Temp:      TempSrc:      SpO2: 92% 93% 95% 92%  Weight:      Height:        Constitutional: awake, alert, cooperative, NAD, calm, comfortable Eyes: PERRL, lids and conjunctivae normal ENMT: Mucous membranes are moist. Posterior pharynx clear of any exudate or lesions.Normal dentition.  Neck: normal, supple, no masses, no thyromegaly Respiratory: diminished BS LLL,  no wheezing, no crackles. Normal respiratory effort. No accessory muscle use.  Cardiovascular: normal s1, s2 sounds, no murmurs / rubs / gallops. No extremity  edema. 2+ pedal pulses. No carotid bruits.  Abdomen: no tenderness, no masses palpated. No hepatosplenomegaly. Bowel sounds positive.  Musculoskeletal: no clubbing / cyanosis. No joint deformity upper and lower extremities. Good ROM, no contractures. Normal muscle tone.  Skin: no rashes, lesions, ulcers. No induration Neurologic: CN 2-12 grossly intact. Sensation intact, DTR normal. Strength 5/5 in all 4.  Psychiatric: Normal judgment and insight. Alert and oriented x 3. Normal mood.   Labs on Admission: I have personally reviewed following labs and imaging studies  CBC: Recent Labs  Lab 04/04/20 1102  WBC 15.6*  NEUTROABS 12.2*  HGB  12.6*  HCT 39.4  MCV 98.0  PLT 071   Basic Metabolic Panel: Recent Labs  Lab 04/04/20 1102  NA 134*  K 3.8  CL 104  CO2 23  GLUCOSE 112*  BUN 10  CREATININE 0.69  CALCIUM 8.7*   GFR: Estimated Creatinine Clearance: 68.7 mL/min (by C-G formula based on SCr of 0.69 mg/dL). Liver Function Tests: Recent Labs  Lab 04/04/20 1102  AST 24  ALT 21  ALKPHOS 54  BILITOT 0.6  PROT 6.5  ALBUMIN 3.5   No results for input(s): LIPASE, AMYLASE in the last 168 hours. No results for input(s): AMMONIA in the last 168 hours. Coagulation Profile: Recent Labs  Lab 04/04/20 1102  INR 1.0   Cardiac Enzymes: No results for input(s): CKTOTAL, CKMB, CKMBINDEX, TROPONINI in the last 168 hours. BNP (last 3 results) No results for input(s): PROBNP in the last 8760 hours. HbA1C: No results for input(s): HGBA1C in the last 72 hours. CBG: Recent Labs  Lab 04/04/20 1000  GLUCAP 117*   Lipid Profile: No results for input(s): CHOL, HDL, LDLCALC, TRIG, CHOLHDL, LDLDIRECT in the last 72 hours. Thyroid Function Tests: No results for input(s): TSH, T4TOTAL, FREET4, T3FREE, THYROIDAB in the last 72 hours. Anemia Panel: No results for input(s): VITAMINB12, FOLATE, FERRITIN, TIBC, IRON, RETICCTPCT in the last 72 hours. Urine analysis:    Component Value  Date/Time   COLORURINE YELLOW 07/22/2019 1345   APPEARANCEUR HAZY (A) 07/22/2019 1345   APPEARANCEUR Clear 05/22/2018 1547   LABSPEC 1.002 (L) 07/22/2019 1345   PHURINE 9.0 (H) 07/22/2019 1345   GLUCOSEU NEGATIVE 07/22/2019 1345   HGBUR SMALL (A) 07/22/2019 1345   BILIRUBINUR NEGATIVE 07/22/2019 1345   BILIRUBINUR Negative 05/22/2018 1547   KETONESUR NEGATIVE 07/22/2019 1345   PROTEINUR NEGATIVE 07/22/2019 1345   UROBILINOGEN negative 03/07/2014 1059   NITRITE NEGATIVE 07/22/2019 1345   LEUKOCYTESUR MODERATE (A) 07/22/2019 1345    Radiological Exams on Admission: CT Head Wo Contrast  Result Date: 04/04/2020 CLINICAL DATA:  Eliezer Lofts lysed weakness EXAM: CT HEAD WITHOUT CONTRAST TECHNIQUE: Contiguous axial images were obtained from the base of the skull through the vertex without intravenous contrast. COMPARISON:  Jul 06, 2019 FINDINGS: Brain: No evidence of acute infarction, hemorrhage, hydrocephalus, extra-axial collection or mass lesion/mass effect. Global parenchymal volume loss. Periventricular white matter hypodensities consistent with sequela of chronic microvascular ischemic disease. Vascular: No hyperdense vessel or unexpected calcification. Skull: Normal. Negative for fracture or focal lesion. Sinuses/Orbits: Air-fluid levels within the sphenoid and RIGHT maxillary sinus. Mucosal thickening of multiple ethmoid air cells. Other: None. IMPRESSION: 1. No acute intracranial abnormality. 2. Air-fluid levels within the sphenoid and RIGHT maxillary sinus. This could reflect acute sinusitis in the appropriate clinical setting. Electronically Signed   By: Valentino Saxon MD   On: 04/04/2020 12:41   CT CHEST ABDOMEN PELVIS W CONTRAST  Result Date: 04/04/2020 CLINICAL DATA:  Weakness, cough, nasal congestion, abdominal pain and emesis. EXAM: CT CHEST, ABDOMEN, AND PELVIS WITH CONTRAST TECHNIQUE: Multidetector CT imaging of the chest, abdomen and pelvis was performed following the standard protocol  during bolus administration of intravenous contrast. CONTRAST:  134m OMNIPAQUE IOHEXOL 300 MG/ML  SOLN COMPARISON:  PET 03/03/2020. FINDINGS: CT CHEST FINDINGS Cardiovascular: Atherosclerotic calcification of the aorta, aortic valve and coronary arteries. Heart size within normal limits. No pericardial effusion. Mediastinum/Nodes: No pathologically enlarged mediastinal, hilar or axillary lymph nodes. Esophagus is unremarkable. Lungs/Pleura: Image quality is degraded by respiratory motion and expiratory phase imaging. Calcified granuloma in the right  upper lobe. Paraspinal scarring/atelectasis in the medial right lower lobe. Probable nodular scarring in the apex of the left upper lobe (4/13). New airspace consolidation and ground-glass in the superior segment left lower lobe. Minimal peribronchovascular ground-glass in the lingula. No pleural fluid. Airway is unremarkable. Musculoskeletal: Sclerotic lesions are seen throughout the visualized osseous structures. CT ABDOMEN PELVIS FINDINGS Hepatobiliary: Subcentimeter low-attenuation lesion in the left hepatic lobe is too small to characterize but unchanged. Liver and gallbladder are unremarkable. No biliary ductal dilatation. Pancreas: Negative. Spleen: Negative. Adrenals/Urinary Tract: Adrenal glands and right kidney are unremarkable. Subcentimeter low-attenuation lesion in the left kidney is too small to characterize but statistically, a cyst is likely. Ureters are decompressed. TURP defect in the bladder. Bladder is otherwise grossly unremarkable. Stomach/Bowel: Small hiatal hernia. Stomach, small bowel, appendix in colon are otherwise unremarkable. Vascular/Lymphatic: Atherosclerotic calcification of the aorta. No pathologically enlarged lymph nodes. Reproductive: Prostatectomy. Other: No free fluid. Mesenteries and peritoneum are unremarkable. Tiny umbilical hernia contains fat. Musculoskeletal: Sclerotic lesions are again seen throughout the visualized osseous  structures. IMPRESSION: 1. New left lower lobe pneumonia. 2. Diffuse osseous metastatic disease. 3. Aortic atherosclerosis (ICD10-I70.0). Coronary artery calcification. Electronically Signed   By: Lorin Picket M.D.   On: 04/04/2020 12:45   DG Chest Port 1 View  Result Date: 04/04/2020 CLINICAL DATA:  Questionable sepsis EXAM: PORTABLE CHEST 1 VIEW COMPARISON:  07/20/2019, CT chest, 06/27/2019 FINDINGS: Low volume AP portable examination with mild diffuse interstitial pulmonary opacity. Redemonstrated pulmonary nodule of the left upper lobe. The heart and mediastinum are unremarkable. Osseous structures are unremarkable. IMPRESSION: 1. Low volume AP portable examination with mild diffuse interstitial pulmonary opacity, likely edema. This appearance may be exaggerated by low volume technique. 2. Redemonstrated pulmonary nodule of the left upper lobe, better assessed by prior CT. Electronically Signed   By: Eddie Candle M.D.   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 Community acquired pneumonia - Pt immunocompromised and high risk for deterioration.  Admit for IV antibiotics, supportive measures.  2. Acute bacterial sinusitis - continue IV ceftriaxone and azithromycin.  3. Chronic pain - resume home treatment plan with duragesic patch 25 mcg and dilaudid every 8 hours PRN.  4. Constipation - opioid induced - added laxatives.  5. DNR - continue DNR order in hospital.  6. Leukocytosis - secondary to infection above, follow CBC.  7. Metastatic prostate cancer on chemotherapy  - castration sensitive, followed by Dr. Delton Coombes.  8. Hypothyroidism - waiting on home meds to be reconciled.   DVT prophylaxis: enoxaparin  Code Status: DNR   Family Communication:   Disposition Plan: Home   Consults called: n/a   Admission status: INP  Level of care: Med-Surg Irwin Brakeman MD [facility] How to contact the [facility] Attending or Consulting provider [place] or covering provider during after hours [place], for this patient?  1. Check the care team in [facility] and look for a) attending/consulting [facility] provider listed and b) the [facility] team listed 2. Log into [email or web address] and use [place]'s universal password to  access. If you do not have the password, please contact the hospital operator. 3. Locate the [facility] provider you are looking for under [facility] and page to a number that you can be directly reached. 4. If you still have difficulty reaching the provider, please page the [facility] (Director on Call) for the Hospitalists listed on amion for assistance.   If 7PM-7AM, please contact night-coverage [email or web address] Password [email or web address]  04/04/2020, 2:19 PM

## 2020-04-04 NOTE — ED Notes (Signed)
Patient transported to CT 

## 2020-04-05 ENCOUNTER — Other Ambulatory Visit: Payer: Self-pay

## 2020-04-05 ENCOUNTER — Encounter (HOSPITAL_COMMUNITY): Payer: Self-pay | Admitting: Family Medicine

## 2020-04-05 DIAGNOSIS — J189 Pneumonia, unspecified organism: Secondary | ICD-10-CM | POA: Diagnosis not present

## 2020-04-05 DIAGNOSIS — E782 Mixed hyperlipidemia: Secondary | ICD-10-CM

## 2020-04-05 DIAGNOSIS — J019 Acute sinusitis, unspecified: Secondary | ICD-10-CM | POA: Diagnosis not present

## 2020-04-05 DIAGNOSIS — I2510 Atherosclerotic heart disease of native coronary artery without angina pectoris: Secondary | ICD-10-CM | POA: Diagnosis not present

## 2020-04-05 DIAGNOSIS — N401 Enlarged prostate with lower urinary tract symptoms: Secondary | ICD-10-CM | POA: Diagnosis not present

## 2020-04-05 LAB — CBC WITH DIFFERENTIAL/PLATELET
Abs Immature Granulocytes: 0.03 10*3/uL (ref 0.00–0.07)
Basophils Absolute: 0.1 10*3/uL (ref 0.0–0.1)
Basophils Relative: 1 %
Eosinophils Absolute: 0.8 10*3/uL — ABNORMAL HIGH (ref 0.0–0.5)
Eosinophils Relative: 9 %
HCT: 36.4 % — ABNORMAL LOW (ref 39.0–52.0)
Hemoglobin: 11.8 g/dL — ABNORMAL LOW (ref 13.0–17.0)
Immature Granulocytes: 0 %
Lymphocytes Relative: 13 %
Lymphs Abs: 1.2 10*3/uL (ref 0.7–4.0)
MCH: 31 pg (ref 26.0–34.0)
MCHC: 32.4 g/dL (ref 30.0–36.0)
MCV: 95.5 fL (ref 80.0–100.0)
Monocytes Absolute: 0.9 10*3/uL (ref 0.1–1.0)
Monocytes Relative: 10 %
Neutro Abs: 5.9 10*3/uL (ref 1.7–7.7)
Neutrophils Relative %: 67 %
Platelets: 221 10*3/uL (ref 150–400)
RBC: 3.81 MIL/uL — ABNORMAL LOW (ref 4.22–5.81)
RDW: 14.4 % (ref 11.5–15.5)
WBC: 8.9 10*3/uL (ref 4.0–10.5)
nRBC: 0 % (ref 0.0–0.2)

## 2020-04-05 LAB — BLOOD CULTURE ID PANEL (REFLEXED) - BCID2

## 2020-04-05 LAB — MAGNESIUM: Magnesium: 2.2 mg/dL (ref 1.7–2.4)

## 2020-04-05 LAB — COMPREHENSIVE METABOLIC PANEL
ALT: 17 U/L (ref 0–44)
AST: 19 U/L (ref 15–41)
Albumin: 3.2 g/dL — ABNORMAL LOW (ref 3.5–5.0)
Alkaline Phosphatase: 50 U/L (ref 38–126)
Anion gap: 8 (ref 5–15)
BUN: 7 mg/dL — ABNORMAL LOW (ref 8–23)
CO2: 22 mmol/L (ref 22–32)
Calcium: 8.2 mg/dL — ABNORMAL LOW (ref 8.9–10.3)
Chloride: 106 mmol/L (ref 98–111)
Creatinine, Ser: 0.57 mg/dL — ABNORMAL LOW (ref 0.61–1.24)
GFR, Estimated: >60 mL/min (ref >60)
Glucose, Bld: 108 mg/dL — ABNORMAL HIGH (ref 70–99)
Potassium: 3.5 mmol/L (ref 3.5–5.1)
Sodium: 136 mmol/L (ref 135–145)
Total Bilirubin: 0.6 mg/dL (ref 0.3–1.2)
Total Protein: 6.3 g/dL — ABNORMAL LOW (ref 6.5–8.1)

## 2020-04-05 MED ORDER — PREDNISONE 10 MG PO TABS
5.0000 mg | ORAL_TABLET | Freq: Every day | ORAL | Status: DC
  Administered 2020-04-06 – 2020-04-09 (×3): 5 mg via ORAL
  Filled 2020-04-05 (×5): qty 1

## 2020-04-05 MED ORDER — ABIRATERONE ACETATE 250 MG PO TABS
1000.0000 mg | ORAL_TABLET | Freq: Every day | ORAL | Status: DC
  Administered 2020-04-05 – 2020-04-09 (×4): 1000 mg via ORAL

## 2020-04-05 MED ORDER — ASPIRIN EC 81 MG PO TBEC
81.0000 mg | DELAYED_RELEASE_TABLET | Freq: Every day | ORAL | Status: DC
  Administered 2020-04-05 – 2020-04-09 (×4): 81 mg via ORAL
  Filled 2020-04-05 (×6): qty 1

## 2020-04-05 MED ORDER — DOXYCYCLINE HYCLATE 100 MG PO TABS
100.0000 mg | ORAL_TABLET | Freq: Two times a day (BID) | ORAL | Status: DC
  Filled 2020-04-05 (×3): qty 1

## 2020-04-05 MED ORDER — ALPRAZOLAM 0.5 MG PO TABS
0.5000 mg | ORAL_TABLET | Freq: Three times a day (TID) | ORAL | Status: DC | PRN
  Administered 2020-04-06 – 2020-04-08 (×5): 0.5 mg via ORAL
  Filled 2020-04-05 (×6): qty 1

## 2020-04-05 MED ORDER — ALPRAZOLAM 0.5 MG PO TABS
0.5000 mg | ORAL_TABLET | Freq: Two times a day (BID) | ORAL | Status: DC | PRN
  Filled 2020-04-05: qty 1

## 2020-04-05 MED ORDER — FLUTICASONE PROPIONATE 50 MCG/ACT NA SUSP
2.0000 | Freq: Every day | NASAL | Status: DC
  Administered 2020-04-05 – 2020-04-09 (×5): 2 via NASAL
  Filled 2020-04-05 (×2): qty 16

## 2020-04-05 MED ORDER — DULOXETINE HCL 60 MG PO CPEP
60.0000 mg | ORAL_CAPSULE | Freq: Every day | ORAL | Status: DC
  Administered 2020-04-05 – 2020-04-09 (×4): 60 mg via ORAL
  Filled 2020-04-05 (×6): qty 1

## 2020-04-05 MED ORDER — DICLOFENAC SODIUM 1 % TD GEL
2.0000 g | Freq: Four times a day (QID) | TRANSDERMAL | Status: DC
  Administered 2020-04-05 – 2020-04-09 (×12): 2 g via TOPICAL
  Filled 2020-04-05: qty 100

## 2020-04-05 MED ORDER — LORATADINE 10 MG PO TABS
10.0000 mg | ORAL_TABLET | Freq: Every day | ORAL | Status: DC
  Administered 2020-04-05 – 2020-04-09 (×4): 10 mg via ORAL
  Filled 2020-04-05 (×3): qty 1

## 2020-04-05 MED ORDER — CEFAZOLIN SODIUM-DEXTROSE 2-4 GM/100ML-% IV SOLN
2.0000 g | Freq: Three times a day (TID) | INTRAVENOUS | Status: DC
  Administered 2020-04-05 – 2020-04-09 (×12): 2 g via INTRAVENOUS
  Filled 2020-04-05 (×12): qty 100

## 2020-04-05 NOTE — Progress Notes (Signed)
Pt has complained of being cold and hot at times but has remained afebrile this shift. Headache earlier was relieved by Tylenol 650 mg. Pt continues to have pain to back intermittently but stands up and stretches back to relieve the pain. Pt continues to need assistance out of bed to void, unsteady gait. Bed alarm remains activated. Pt talked to both sons by telephone this shift.

## 2020-04-05 NOTE — Progress Notes (Signed)
1500 - Patient c/o mid sternal chest pain with nausea.  Started abruptly while respiratory therapy was giving breathing treatment.  Patient has been extremely stressed and tearful for the last hour due to some issues with his sons.  12 lead obtained.  Noted to be wnl.  Gave medications, to include his ASA, Xanax, and gabapentin.   Shortly after episode, family arrived with meal and symptoms subsided.  Dr. Wynetta Emery made aware.  No c/o noted at this time.

## 2020-04-05 NOTE — Progress Notes (Signed)
PHARMACY - PHYSICIAN COMMUNICATION CRITICAL VALUE ALERT - BLOOD CULTURE IDENTIFICATION (BCID)  Keith Olson. is an 80 y.o. 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 Diagnosed with CAP. Only 1 set of BCx done. MD will order another set; however, abx have been given  Name of physician (or Provider) Contacted: Dr. Wynetta Emery  Current antibiotics: doxycyline 100mg  po bid  Changes to prescribed antibiotics recommended:  Recommendations accepted by provider  Will add cefazolin 2gm IV q8h. F/U with additional [place], clinical progress  Results for orders placed or performed during the hospital encounter of 04/04/20  Blood Culture ID Panel (Reflexed) (Collected: 04/04/2020  1:14 PM)  Result Value Ref Range   Enterococcus faecalis NOT DETECTED NOT DETECTED   Enterococcus Faecium NOT DETECTED NOT DETECTED   Listeria monocytogenes NOT DETECTED NOT DETECTED   Staphylococcus species DETECTED (A) NOT DETECTED   Staphylococcus aureus (BCID) DETECTED (A) NOT DETECTED   Staphylococcus epidermidis NOT DETECTED NOT DETECTED   Staphylococcus lugdunensis NOT DETECTED NOT DETECTED   Streptococcus species NOT DETECTED NOT DETECTED   Streptococcus agalactiae NOT DETECTED NOT DETECTED   Streptococcus pneumoniae NOT DETECTED NOT DETECTED   Streptococcus pyogenes NOT DETECTED NOT DETECTED   A.calcoaceticus-baumannii NOT DETECTED NOT DETECTED   Bacteroides fragilis NOT DETECTED NOT DETECTED   Enterobacterales NOT DETECTED NOT DETECTED   Enterobacter cloacae complex NOT DETECTED NOT DETECTED   Escherichia coli NOT DETECTED NOT DETECTED   Klebsiella aerogenes NOT DETECTED NOT DETECTED   Klebsiella oxytoca NOT DETECTED NOT  DETECTED   Klebsiella pneumoniae NOT DETECTED NOT DETECTED   Proteus species NOT DETECTED NOT DETECTED   Salmonella species NOT DETECTED NOT DETECTED   Serratia marcescens NOT DETECTED NOT DETECTED   Haemophilus influenzae NOT DETECTED NOT DETECTED   Neisseria meningitidis NOT DETECTED NOT DETECTED   Pseudomonas aeruginosa NOT DETECTED NOT DETECTED   Stenotrophomonas maltophilia NOT DETECTED NOT DETECTED   Candida albicans NOT DETECTED NOT DETECTED   Candida auris NOT DETECTED NOT DETECTED   Candida glabrata NOT DETECTED NOT DETECTED   Candida krusei NOT DETECTED NOT DETECTED   Candida parapsilosis NOT DETECTED NOT DETECTED   Candida tropicalis NOT DETECTED NOT DETECTED   Cryptococcus neoformans/gattii NOT DETECTED NOT DETECTED   Meth resistant mecA/C and MREJ NOT DETECTED NOT DETECTED    Cristy Friedlander 04/05/2020  3:10 PM

## 2020-04-05 NOTE — Progress Notes (Signed)
80 yo M with hx of metastatic prostate cancer, adm with weakness, malaise. Found to have LLL pneumonia on CT.  He is now found to have MSSA bcx and ucx.   Please check TTE/TEE. Consider stopping doxy    [facility]icrobial Management Team Staphylococcus aureus bacteremia   Staphylococcus aureus bacteremia (SAB) is associated with a high rate of complications and mortality.  Specific aspects of clinical management are critical to optimizing the outcome of patients with SAB.  Therefore, the [facility] Antimicrobial Management Team [facility]) has initiated an intervention aimed at improving the management of SAB at [facility].  To do so, Infectious Diseases physicians are providing an evidence-based consult for the management of all patients with SAB.     Yes No Comments  Perform follow-up blood cultures (even if the patient is afebrile) to ensure clearance of bacteremia [x]  []    Remove vascular catheter and obtain follow-up blood cultures after the removal of the catheter []  []    Perform echocardiography to evaluate for endocarditis (transthoracic ECHO is 40-50% sensitive, TEE is > 90% sensitive) [x]  []  Please keep in mind, that neither test can definitively EXCLUDE endocarditis, and that should clinical suspicion remain high for endocarditis the patient should then still be treated with an "endocarditis" duration of therapy = 6 weeks  Consult electrophysiologist to evaluate implanted cardiac device (pacemaker, ICD) []  []    Ensure source control []  []  Have all abscesses been drained effectively? Have deep seeded infections (septic joints or osteomyelitis) had appropriate surgical debridement?  Investigate for "metastatic" sites of infection []  []  Does the patient have ANY symptom or physical exam finding that would suggest a deeper infection (back or neck pain that may be suggestive of vertebral osteomyelitis or epidural abscess, muscle pain that could be a symptom of pyomyositis)?  Keep in mind  that for deep seeded infections MRI imaging with contrast is preferred rather than other often insensitive tests such as plain x-rays, especially early in a patient's presentation.  Change antibiotic therapy to __________________ [x]  []  Beta-lactam antibiotics are preferred for MSSA due to higher cure rates.   If on Vancomycin, goal trough should be 15 - 20 mcg/mL  Estimated duration of IV antibiotic therapy:   []  []  Consult case management for probably prolonged outpatient IV antibiotic therapy

## 2020-04-05 NOTE — Progress Notes (Addendum)
PROGRESS NOTE   Keith Olson.  YDX:[id number] DOB: Feb 08, 1941 DOA: 04/04/2020 PCP: Dettinger, Fransisca Kaufmann, MD   Chief Complaint  Patient presents with  . Weakness   Level of care: Med-Surg  Brief Admission History:  80 y.o. 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  He had one episode of emesis yesterday and generalized abdominal pain.  He denies chest pain.  He denies fever chills and shortness of breath.  He denies dizziness.  He does report sinus pain and congestion sore throat postnasal drainage.  He does report cough. ED Course: Patient was afebrile arrival blood pressure 149/73 pulse 93 respirations 13 pulse ox 93% on 2 L of oxygen.  Sodium 134 potassium 3.8 glucose 112, high-sensitivity troponin X, lactic acid 0.9, WBC 15.6, hemoglobin 12.6.  CT of the abdomen and pelvis revealed new left lower lobe pneumonia.  CT head without contrast with findings of a right maxillary sinusitis.  Patient was admitted for community-acquired pneumonia.  SARS 2 coronavirus test negative.  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 Community acquired pneumonia - Pt immunocompromised and high risk for deterioration.  Continue IV antibiotics, supportive measures.  He is improving.    2. Acute bacterial sinusitis - continue IV antibiotics. Adding loratadine and flonase NS.  3. MSSA bacteremia - changed antibiotic to IV cefazolin, repeat  culture ordered, TTE ordered.   4. Chronic pain - resumed home treatment plan with duragesic patch 25 mcg and dilaudid every 8 hours PRN.  5. GAD - resume home alprazolam.  6. Constipation - opioid induced - added laxatives.  7. DNR - continue DNR order in hospital.  8. Leukocytosis - secondary to infection above. WBC normalized.  9. Generalized weakness - PT recommending SNF. Pt agreeable. TOC consulted for placement.   10. Metastatic prostate cancer on chemotherapy - castration sensitive, followed by Dr. Delton Coombes.  11. Hypothyroidism - he is not on any thyroid supplement at this time.    DVT prophylaxis: enoxaparin  Code Status: DNR   Family Communication:  t/c to son 2/5 no answer Disposition Plan: Home   Consults called: n/a   Admission status: INP  Status is: Inpatient  Remains inpatient appropriate because:Inpatient level of care appropriate due to severity of illness   Dispo: The patient is from: Home              Anticipated d/c is to: SNF              Anticipated d/c date is: 2 days              Patient currently is medically stable to d/c.   Difficult to place patient No  Consultants:   n/a  Procedures:   n/a  Antimicrobials:  Ceftriaxone 2/4>> Azithromycin 2/4>>   Subjective: Pt reports sinus pain and nasal congestion.   Objective: Vitals:   04/04/20 2215 04/04/20 2358 04/05/20 0358 04/05/20 0900  BP: 127/60  124/62 138/64  Pulse:  80  63 66  Resp: 18  16 16   Temp: 98.5 F (36.9 C) 97.9 F (36.6 C) 98 F (36.7 C) 98.4 F (36.9 C)  TempSrc: Axillary Oral Oral Axillary  SpO2: 94%  96% 100%  Weight:      Height:        Intake/Output Summary (Last 24 hours) at 04/05/2020 1420 Last data filed at 04/05/2020 0631 Gross per 24 hour  Intake 1162 ml  Output 1725 ml  Net -563 ml   Filed Weights   04/04/20 0945  Weight: 73 kg    Examination:  General exam: Appears calm and comfortable  Respiratory system: Clear to auscultation. Respiratory effort  normal. Cardiovascular system: normal S1 & S2 heard. No JVD, murmurs, rubs, gallops or clicks. No pedal edema. Gastrointestinal system: Abdomen is nondistended, soft and nontender. No organomegaly or masses felt. Normal bowel sounds heard. Central nervous system: Alert and oriented. No focal neurological deficits. Extremities: Symmetric 5 x 5 power. Skin: No rashes, lesions or ulcers Psychiatry: Judgement and insight appear normal. Mood & affect appropriate.   Data Reviewed: I have personally reviewed following labs and imaging studies  CBC: Recent Labs  Lab 04/04/20 1102 04/04/20 1154 04/05/20 0400  WBC 15.6*  --  8.9  NEUTROABS 12.2*  --  5.9  HGB 12.6* 13.6 11.8*  HCT 39.4 40.0 36.4*  MCV 98.0  --  95.5  PLT 215  --  431    Basic Metabolic Panel: Recent Labs  Lab 04/04/20 1102 04/04/20 1154 04/05/20 0400  NA 134* 136 136  K 3.8 3.9 3.5  CL 104 105 106  CO2 23  --  22  GLUCOSE 112* 106* 108*  BUN 10 9 7*  CREATININE 0.69 0.60* 0.57*  CALCIUM 8.7*  --  8.2*  MG  --   --  2.2    GFR: Estimated Creatinine Clearance: 68.7 mL/min (A) (by C-G formula based on SCr of 0.57 mg/dL (L)).  Liver Function Tests: Recent Labs  Lab 04/04/20 1102 04/05/20 0400  AST 24 19  ALT 21 17  ALKPHOS 54 50  BILITOT 0.6 0.6  PROT 6.5 6.3*  ALBUMIN 3.5 3.2*    CBG: Recent Labs  Lab 04/04/20 1000  GLUCAP 117*    Recent Results (from the past 240 hour(s))  SARS Coronavirus 2 by RT PCR (hospital order, performed in [facility] hospital lab) Nasopharyngeal Nasopharyngeal Swab     Status: None   Collection Time: 04/04/20  9:42 AM   Specimen: Nasopharyngeal Swab  Result Value Ref Range Status   SARS Coronavirus 2 NEGATIVE NEGATIVE Final    Comment: (NOTE) SARS-CoV-2 target nucleic acids are NOT DETECTED.  The SARS-CoV-2 RNA is generally detectable in upper and lower respiratory specimens during the acute phase of infection. The lowest concentration of SARS-CoV-2 viral copies  this assay can detect is 250 copies / mL. A negative result does not preclude SARS-CoV-2 infection and should not be used as the sole basis for treatment or other patient management decisions.  A negative result may occur with improper specimen collection / handling, submission of specimen other than nasopharyngeal swab, presence of viral mutation(s) within the areas targeted by this assay, and inadequate number of viral copies (<250 copies / mL). A negative result must be combined with clinical observations, patient history, and epidemiological information.  Fact Sheet for Patients:   StrictlyIdeas.no  Fact Sheet for Healthcare Providers: BankingDealers.co.za  This test is not yet approved or  cleared by the [place]  States FDA and has been authorized for detection and/or diagnosis of SARS-CoV-2 by FDA under an Emergency Use Authorization (EUA).  This EUA will remain in effect (meaning this test can be used) for the duration of the COVID-19 declaration under Section 564(b)(1) of the Act, 21 U.S.C. section 360bbb-3(b)(1), unless the authorization is terminated or revoked sooner.  Performed at [facility], [address] [place], [place] [address]   Culture, blood (single) w Reflex to ID Panel     Status: None (Preliminary result)   Collection Time: 04/04/20  1:14 PM   Specimen: BLOOD  Result Value Ref Range Status   Specimen Description   Final    BLOOD BLOOD RIGHT HAND Performed at [facility], [address], [place], [place] [address]    Special Requests   Final    Blood Culture results may not be optimal due to an inadequate volume of blood received in culture bottles [place] Performed at [facility], [address] [place], [place] [address]    Culture  Setup Time   Final    GRAM POSITIVE COCCI AEROBIC BOTTLE ONLY Gram Stain Report Called to,Read Back By and Verified With: YATES T @ [id number] ON [id number]  BY HENDERSON L. Organism ID to follow Performed at [facility], [place] [address] [place], [place] [address]    Culture GRAM POSITIVE COCCI  Final   Report Status PENDING  Incomplete  MRSA PCR Screening     Status: None   Collection Time: 04/04/20  1:48 PM   Specimen: Nasal Mucosa; Nasopharyngeal  Result Value Ref Range Status   MRSA by PCR NEGATIVE NEGATIVE Final    Comment:        The GeneXpert MRSA Assay (FDA approved for NASAL specimens only), is one component of a comprehensive MRSA colonization surveillance program. It is not intended to diagnose MRSA infection nor to guide or monitor treatment for MRSA infections. Performed at [facility], [address], [place],  [address]      Radiology Studies: CT Head Wo Contrast  Result Date: 04/04/2020 CLINICAL DATA:  Eliezer Lofts lysed weakness EXAM: CT HEAD WITHOUT CONTRAST TECHNIQUE: Contiguous axial images were obtained from the base of the skull through the vertex without intravenous contrast. COMPARISON:  Jul 06, 2019 FINDINGS: Brain: No evidence of acute infarction, hemorrhage, hydrocephalus, extra-axial collection or mass lesion/mass effect. Global parenchymal volume loss. Periventricular white matter hypodensities consistent with sequela of chronic microvascular ischemic disease. Vascular: No hyperdense vessel or unexpected calcification. Skull: Normal. Negative for fracture or focal lesion. Sinuses/Orbits: Air-fluid levels within the sphenoid and RIGHT maxillary sinus. Mucosal thickening of multiple ethmoid air cells. Other: None. IMPRESSION: 1. No acute intracranial abnormality. 2. Air-fluid levels within the sphenoid and RIGHT maxillary sinus. This could reflect acute sinusitis in the appropriate clinical setting. Electronically Signed   By: Valentino Saxon MD   On: 04/04/2020 12:41   CT CHEST ABDOMEN PELVIS W CONTRAST  Result Date: 04/04/2020 CLINICAL DATA:  Weakness, cough, nasal congestion, abdominal pain and emesis.  EXAM: CT CHEST, ABDOMEN, AND PELVIS WITH CONTRAST TECHNIQUE: Multidetector CT imaging of the chest, abdomen and pelvis was performed following the standard protocol during bolus administration of intravenous contrast. CONTRAST:  143mL OMNIPAQUE IOHEXOL 300 MG/ML  SOLN COMPARISON:  PET 03/03/2020. FINDINGS: CT CHEST FINDINGS Cardiovascular: Atherosclerotic calcification of the aorta, aortic valve and coronary arteries. Heart size within normal limits. No pericardial effusion. Mediastinum/Nodes: No pathologically enlarged mediastinal, hilar or axillary lymph nodes. Esophagus  is unremarkable. Lungs/Pleura: Image quality is degraded by respiratory motion and expiratory phase imaging. Calcified granuloma in the right upper lobe. Paraspinal scarring/atelectasis in the medial right lower lobe. Probable nodular scarring in the apex of the left upper lobe (4/13). New airspace consolidation and ground-glass in the superior segment left lower lobe. Minimal peribronchovascular ground-glass in the lingula. No pleural fluid. Airway is unremarkable. Musculoskeletal: Sclerotic lesions are seen throughout the visualized osseous structures. CT ABDOMEN PELVIS FINDINGS Hepatobiliary: Subcentimeter low-attenuation lesion in the left hepatic lobe is too small to characterize but unchanged. Liver and gallbladder are unremarkable. No biliary ductal dilatation. Pancreas: Negative. Spleen: Negative. Adrenals/Urinary Tract: Adrenal glands and right kidney are unremarkable. Subcentimeter low-attenuation lesion in the left kidney is too small to characterize but statistically, a cyst is likely. Ureters are decompressed. TURP defect in the bladder. Bladder is otherwise grossly unremarkable. Stomach/Bowel: Small hiatal hernia. Stomach, small bowel, appendix in colon are otherwise unremarkable. Vascular/Lymphatic: Atherosclerotic calcification of the aorta. No pathologically enlarged lymph nodes. Reproductive: Prostatectomy. Other: No free fluid.  Mesenteries and peritoneum are unremarkable. Tiny umbilical hernia contains fat. Musculoskeletal: Sclerotic lesions are again seen throughout the visualized osseous structures. IMPRESSION: 1. New left lower lobe pneumonia. 2. Diffuse osseous metastatic disease. 3. Aortic atherosclerosis (ICD10-I70.0). Coronary artery calcification. Electronically Signed   By: Lorin Picket M.D.   On: 04/04/2020 12:45   DG Chest Port 1 View  Result Date: 04/04/2020 CLINICAL DATA:  Questionable sepsis EXAM: PORTABLE CHEST 1 VIEW COMPARISON:  07/20/2019, CT chest, 06/27/2019 FINDINGS: Low volume AP portable examination with mild diffuse interstitial pulmonary opacity. Redemonstrated pulmonary nodule of the left upper lobe. The heart and mediastinum are unremarkable. Osseous structures are unremarkable. IMPRESSION: 1. Low volume AP portable examination with mild diffuse interstitial pulmonary opacity, likely edema. This appearance may be exaggerated by low volume technique. 2. Redemonstrated pulmonary nodule of the left upper lobe, better assessed by prior CT. Electronically Signed   By: Eddie Candle M.D.   On: 04/04/2020 10:40   Scheduled Meds: . abiraterone acetate  1,000 mg Oral Daily  . aspirin EC  81 mg Oral Daily  . azithromycin  500 mg Oral Daily  . diclofenac sodium  2 g Topical QID  . DULoxetine  60 mg Oral Daily  . enoxaparin (LOVENOX) injection  40 mg Subcutaneous Q24H  . fentaNYL  1 patch Transdermal Q72H  . fluticasone  2 spray Each Nare Daily  . gabapentin  300 mg Oral TID  . ipratropium-albuterol  3 mL Nebulization TID  . loratadine  10 mg Oral Daily  . pantoprazole  40 mg Oral Daily  . polyethylene glycol  17 g Oral Daily  . [START ON 04/06/2020] predniSONE  5 mg Oral Q breakfast  . senna-docusate  2 tablet Oral QHS  . tamsulosin  0.4 mg Oral Daily   Continuous Infusions: . sodium chloride    . cefTRIAXone (ROCEPHIN)  IV Stopped (04/05/20 1215)     LOS: 1 day   Time spent: 55 mins    Cora Brierley Wynetta Emery, MD How to contact the [facility] Attending or Consulting provider [place] or covering provider during after hours [place], for this patient?  1. Check the care team in [facility] and look for a) attending/consulting TRH provider listed and b) the [facility] team listed 2. Log into [email or web address] and use [place]'s universal password to access. If you do not have the password, please contact the hospital operator. 3. Locate the [facility] provider you are looking for under  [facility] and page to a number that you can be directly reached. 4. If you still have difficulty reaching the provider, please page the [facility] (Director on Call) for the Hospitalists listed on amion for assistance.  04/05/2020, 2:20 PM

## 2020-04-06 ENCOUNTER — Inpatient Hospital Stay (HOSPITAL_COMMUNITY): Payer: Medicare Other

## 2020-04-06 DIAGNOSIS — J019 Acute sinusitis, unspecified: Secondary | ICD-10-CM | POA: Diagnosis not present

## 2020-04-06 DIAGNOSIS — J189 Pneumonia, unspecified organism: Secondary | ICD-10-CM | POA: Diagnosis not present

## 2020-04-06 DIAGNOSIS — I2510 Atherosclerotic heart disease of native coronary artery without angina pectoris: Secondary | ICD-10-CM | POA: Diagnosis not present

## 2020-04-06 DIAGNOSIS — N401 Enlarged prostate with lower urinary tract symptoms: Secondary | ICD-10-CM | POA: Diagnosis not present

## 2020-04-06 DIAGNOSIS — R7881 Bacteremia: Secondary | ICD-10-CM

## 2020-04-06 LAB — CBC WITH DIFFERENTIAL/PLATELET
Abs Immature Granulocytes: 0.03 10*3/uL (ref 0.00–0.07)
Basophils Absolute: 0.1 10*3/uL (ref 0.0–0.1)
Basophils Relative: 1 %
Eosinophils Absolute: 1 10*3/uL — ABNORMAL HIGH (ref 0.0–0.5)
Eosinophils Relative: 13 %
HCT: 32.7 % — ABNORMAL LOW (ref 39.0–52.0)
Hemoglobin: 10.8 g/dL — ABNORMAL LOW (ref 13.0–17.0)
Immature Granulocytes: 0 %
Lymphocytes Relative: 5 %
Lymphs Abs: 0.4 10*3/uL — ABNORMAL LOW (ref 0.7–4.0)
MCH: 31.2 pg (ref 26.0–34.0)
MCHC: 33 g/dL (ref 30.0–36.0)
MCV: 94.5 fL (ref 80.0–100.0)
Monocytes Absolute: 0.7 10*3/uL (ref 0.1–1.0)
Monocytes Relative: 10 %
Neutro Abs: 5.1 10*3/uL (ref 1.7–7.7)
Neutrophils Relative %: 71 %
Platelets: 185 10*3/uL (ref 150–400)
RBC: 3.46 MIL/uL — ABNORMAL LOW (ref 4.22–5.81)
RDW: 13.9 % (ref 11.5–15.5)
WBC: 7.3 10*3/uL (ref 4.0–10.5)
nRBC: 0 % (ref 0.0–0.2)

## 2020-04-06 LAB — URINE CULTURE: Culture: >=100000 — AB

## 2020-04-06 LAB — ECHOCARDIOGRAM COMPLETE
Area-P 1/2: 2.99 cm2
Height: 66 in
S' Lateral: 2 cm
Weight: 2574.97 oz

## 2020-04-06 MED ORDER — ONDANSETRON HCL 4 MG/2ML IJ SOLN: 4.0000 mg | Freq: Four times a day (QID) | INTRAMUSCULAR | Status: DC | PRN

## 2020-04-06 NOTE — Progress Notes (Signed)
*  PRELIMINARY RESULTS* Echocardiogram 2D Echocardiogram has been performed.  Leavy Cella 04/06/2020, 8:54 AM

## 2020-04-06 NOTE — Progress Notes (Signed)
Pt has rested pretty well this evening. Earlier this evening he became nauseated suddenly and vomited x 1. I did send a message to attending through secure chat making aware of this incident. Pt drank 2 Ensure's through the night. He walked with assistance to restroom x3 and had two BM's early this morning.They were soft Type 4 stools. Pt seems to still have discomfort to the left shoulder but Voltaren rubs seems to help.

## 2020-04-06 NOTE — Progress Notes (Signed)
PROGRESS NOTE   Keith Olson.  RCV:[id number] DOB: Sep 24, 1940 DOA: 04/04/2020 PCP: Dettinger, Fransisca Kaufmann, MD   Chief Complaint  Patient presents with  . Weakness   Level of care: Med-Surg  Brief Admission History:  80 y.o. 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  He had one episode of emesis yesterday and generalized abdominal pain.  He denies chest pain.  He denies fever chills and shortness of breath.  He denies dizziness.  He does report sinus pain and congestion sore throat postnasal drainage.  He does report cough. ED Course: Patient was afebrile arrival blood pressure 149/73 pulse 93 respirations 13 pulse ox 93% on 2 L of oxygen.  Sodium 134 potassium 3.8 glucose 112, high-sensitivity troponin X, lactic acid 0.9, WBC 15.6, hemoglobin 12.6.  CT of the abdomen and pelvis revealed new left lower lobe pneumonia.  CT head without contrast with findings of a right maxillary sinusitis.  Patient was admitted for community-acquired pneumonia.  SARS 2 coronavirus test negative.  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 Community acquired pneumonia - Pt immunocompromised and high risk for deterioration.  Continue IV antibiotics, supportive measures.  He is improving.    2. Acute bacterial sinusitis - continue IV antibiotics. Adding loratadine and flonase NS.  3. MSSA bacteremia - changed antibiotic to IV cefazolin, repeat  culture ordered, TTE ordered.   4. Chronic pain - resumed home treatment plan with duragesic patch 25 mcg and dilaudid every 8 hours PRN.  5. GAD - resume home alprazolam.  6. Constipation - opioid induced - added laxatives.  7. DNR - continue DNR order in hospital.  8. Leukocytosis - secondary to infection above. WBC normalized.  9. Generalized weakness - PT recommending SNF. Pt agreeable. TOC consulted for placement.   10. Metastatic prostate cancer on chemotherapy - castration sensitive, followed by Dr. Delton Coombes.  11. Hypothyroidism - he is not on any thyroid supplement at this time.    DVT prophylaxis: enoxaparin  Code Status: DNR   Family Communication:  t/c to son 2/5 no answer Disposition Plan: Home   Consults called: n/a   Admission status: INP  Status is: Inpatient  Remains inpatient appropriate because:Inpatient level of care appropriate due to severity of illness  Dispo: The patient is from: Home              Anticipated d/c is to: SNF              Anticipated d/c date is: 1-2 days              Patient currently is medically stable to d/c.   Difficult to place patient No  Consultants:   n/a  Procedures:   n/a  Antimicrobials:  Ceftriaxone 2/4>> Azithromycin 2/4>>   Subjective: Pt continues to report that his sinus pain is getting better.  He otherwise has body joint aches and pains.    Objective: Vitals:   04/06/20 0332 04/06/20  7026 04/06/20 1330 04/06/20 1402  BP: 130/71 (!) 111/53  (!) 121/58  Pulse: 75 72  76  Resp: 18 18  18   Temp: 98.2 F (36.8 C) 98 F (36.7 C)  98 F (36.7 C)  TempSrc: Oral Oral  Oral  SpO2: 96% 92% 92% 94%  Weight:      Height:        Intake/Output Summary (Last 24 hours) at 04/06/2020 1403 Last data filed at 04/06/2020 0900 Gross per 24 hour  Intake 1140 ml  Output --  Net 1140 ml   Filed Weights   04/04/20 0945  Weight: 73 kg    Examination:  General exam: Appears calm and comfortable  Respiratory system:  Clear to auscultation. Respiratory effort normal. Cardiovascular system: normal S1 & S2 heard. No JVD, murmurs, rubs, gallops or clicks. No pedal edema. Gastrointestinal system: Abdomen is nondistended, soft and nontender. No organomegaly or masses felt. Normal bowel sounds heard. Central nervous system: Alert and oriented. No focal neurological deficits. Extremities: Symmetric 5 x 5 power. Skin: No rashes, lesions or ulcers Psychiatry: Judgement and insight appear normal. Mood & affect appropriate.   Data Reviewed: I have personally reviewed following labs and imaging studies  CBC: Recent Labs  Lab 04/04/20 1102 04/04/20 1154 04/05/20 0400 04/06/20 0453  WBC 15.6*  --  8.9 7.3  NEUTROABS 12.2*  --  5.9 5.1  HGB 12.6* 13.6 11.8* 10.8*  HCT 39.4 40.0 36.4* 32.7*  MCV 98.0  --  95.5 94.5  PLT 215  --  221 378    Basic Metabolic Panel: Recent Labs  Lab 04/04/20 1102 04/04/20 1154 04/05/20 0400  NA 134* 136 136  K 3.8 3.9 3.5  CL 104 105 106  CO2 23  --  22  GLUCOSE 112* 106* 108*  BUN 10 9 7*  CREATININE 0.69 0.60* 0.57*  CALCIUM 8.7*  --  8.2*  MG  --   --  2.2    GFR: Estimated Creatinine Clearance: 68.7 mL/min (A) (by C-G formula based on SCr of 0.57 mg/dL (L)).  Liver Function Tests: Recent Labs  Lab 04/04/20 1102 04/05/20 0400  AST 24 19  ALT 21 17  ALKPHOS 54 50  BILITOT 0.6 0.6  PROT 6.5 6.3*  ALBUMIN 3.5 3.2*    CBG: Recent Labs  Lab 04/04/20 1000  GLUCAP 117*    Recent Results (from the past 240 hour(s))  SARS Coronavirus 2 by RT PCR (hospital order, performed in [facility] hospital lab) Nasopharyngeal Nasopharyngeal Swab     Status: None   Collection Time: 04/04/20  9:42 AM   Specimen: Nasopharyngeal Swab  Result Value Ref Range Status   SARS Coronavirus 2 NEGATIVE NEGATIVE Final    Comment: (NOTE) SARS-CoV-2 target nucleic acids are NOT DETECTED.  The SARS-CoV-2 RNA is generally detectable in upper and lower respiratory specimens  during the acute phase of infection. The lowest concentration of SARS-CoV-2 viral copies this assay can detect is 250 copies / mL. A negative result does not preclude SARS-CoV-2 infection and should not be used as the sole basis for treatment or other patient management decisions.  A negative result may occur with improper specimen collection / handling, submission of specimen other than nasopharyngeal swab, presence of viral mutation(s) within the areas targeted by this assay, and inadequate number of viral copies (<250 copies / mL). A negative result must be combined with clinical observations, patient history, and epidemiological information.  Fact Sheet for Patients:   StrictlyIdeas.no  Fact Sheet for Healthcare Providers: BankingDealers.co.za  This test is not yet approved or  cleared by the [place] FDA and has been authorized for detection and/or diagnosis of SARS-CoV-2 by FDA under an Emergency Use Authorization (EUA).  This EUA will remain in effect (meaning this test can be used) for the duration of the COVID-19 declaration under Section 564(b)(1) of the Act, 21 U.S.C. section 360bbb-3(b)(1), unless the authorization is terminated or revoked sooner.  Performed at [facility], [address], [place], [place] [address]   Culture, blood (single) w Reflex to ID Panel     Status: Abnormal (Preliminary result)   Collection Time: 04/04/20  1:14 PM   Specimen: BLOOD  Result Value Ref Range Status   Specimen Description   Final    BLOOD BLOOD RIGHT HAND Performed at [facility], [address] [place], [place] [address]    Special Requests   Final    Blood Culture results may not be optimal due to an inadequate volume of blood received in culture bottles BOTTLES DRAWN AEROBIC AND ANAEROBIC Performed at [facility], [address], [place], [place] [address]    Culture  Setup Time   Final    GRAM POSITIVE COCCI AEROBIC  BOTTLE ONLY Gram Stain Report Called to,Read Back By and Verified With: YATES T @ 0938 ON [id number] BY HENDERSON L. CRITICAL RESULT CALLED TO, READ BACK BY AND VERIFIED WITH: PHARMD L POOLE [id number] AT 1431 BY CM    Culture (A)  Final    STAPHYLOCOCCUS AUREUS SUSCEPTIBILITIES TO FOLLOW Performed at [facility], [place] [address], [place],  [address]    Report Status PENDING  Incomplete  Blood Culture ID Panel (Reflexed)     Status: Abnormal   Collection Time: 04/04/20  1:14 PM  Result Value Ref Range Status   Enterococcus faecalis NOT DETECTED NOT DETECTED Final   Enterococcus Faecium NOT DETECTED NOT DETECTED Final   Listeria monocytogenes NOT DETECTED NOT DETECTED Final   Staphylococcus species DETECTED (A) NOT DETECTED Final    Comment: CRITICAL RESULT CALLED TO, READ BACK BY AND VERIFIED WITH: PHARMD L POOLE [id number] AT 1431 BY CM    Staphylococcus aureus (BCID) DETECTED (A) NOT DETECTED Final    Comment: CRITICAL RESULT CALLED TO, READ BACK BY AND VERIFIED WITH: PHARMD L POOLE [id number] AT 1431 BY CM    Staphylococcus epidermidis NOT DETECTED NOT DETECTED Final   Staphylococcus lugdunensis NOT DETECTED NOT DETECTED Final   Streptococcus species NOT DETECTED NOT DETECTED Final   Streptococcus agalactiae NOT DETECTED NOT DETECTED Final   Streptococcus pneumoniae NOT DETECTED NOT DETECTED Final   Streptococcus pyogenes NOT DETECTED NOT DETECTED Final   A.calcoaceticus-baumannii NOT DETECTED NOT DETECTED Final   Bacteroides fragilis NOT DETECTED NOT DETECTED Final   Enterobacterales NOT DETECTED NOT DETECTED Final   Enterobacter cloacae complex NOT DETECTED NOT DETECTED Final   Escherichia coli NOT DETECTED NOT DETECTED Final   Klebsiella aerogenes NOT DETECTED NOT DETECTED Final   Klebsiella oxytoca NOT DETECTED NOT DETECTED Final   Klebsiella pneumoniae NOT DETECTED NOT DETECTED Final   Proteus species NOT DETECTED NOT DETECTED Final   Salmonella species NOT DETECTED NOT  DETECTED Final   Serratia marcescens NOT DETECTED NOT DETECTED Final   Haemophilus influenzae NOT DETECTED NOT DETECTED Final   Neisseria meningitidis NOT DETECTED NOT DETECTED Final   Pseudomonas aeruginosa NOT DETECTED NOT DETECTED Final   Stenotrophomonas maltophilia NOT DETECTED NOT DETECTED Final   Candida albicans NOT  DETECTED NOT DETECTED Final   Candida auris NOT DETECTED NOT DETECTED Final   Candida glabrata NOT DETECTED NOT DETECTED Final   Candida krusei NOT DETECTED NOT DETECTED Final   Candida parapsilosis NOT DETECTED NOT DETECTED Final   Candida tropicalis NOT DETECTED NOT DETECTED Final   Cryptococcus neoformans/gattii NOT DETECTED NOT DETECTED Final   Meth resistant mecA/C and MREJ NOT DETECTED NOT DETECTED Final    Comment: Performed at [facility], [place] [address], [place], [place] [address]  MRSA PCR Screening     Status: None   Collection Time: 04/04/20  1:48 PM   Specimen: Nasal Mucosa; Nasopharyngeal  Result Value Ref Range Status   MRSA by PCR NEGATIVE NEGATIVE Final    Comment:        The GeneXpert MRSA Assay (FDA approved for NASAL specimens only), is one component of a comprehensive MRSA colonization surveillance program. It is not intended to diagnose MRSA infection nor to guide or monitor treatment for MRSA infections. Performed at [facility], [address], [place], [place] [address]   Urine culture     Status: Abnormal   Collection Time: 04/04/20  3:44 PM   Specimen: Urine, Catheterized  Result Value Ref Range Status   Specimen Description   Final    URINE, CATHETERIZED Performed at [facility], [address] [place], [place] [address]    Special Requests   Final    NONE Performed at [facility], [address], [place], [place] [address]    Culture (A)  Final    >=100,000 COLONIES/mL GROUP B STREP(S.AGALACTIAE)ISOLATED TESTING AGAINST S. AGALACTIAE NOT ROUTINELY PERFORMED DUE TO PREDICTABILITY OF AMP/PEN/VAN  SUSCEPTIBILITY. Performed at [facility], [place] [address], [place], [place] [address]    Report Status 04/06/2020 FINAL  Final  Culture, blood (Routine X 2) w Reflex to ID Panel     Status: None (Preliminary result)   Collection Time: 04/05/20  3:41 PM   Specimen: BLOOD LEFT HAND  Result Value Ref Range Status   Specimen Description BLOOD LEFT HAND BOTTLES DRAWN AEROBIC ONLY  Final   Special Requests   Final    Blood Culture adequate volume Performed at [facility], [address], [place], [place] [address]    Culture PENDING  Incomplete   Report Status PENDING  Incomplete  Culture, blood (Routine X 2) w Reflex to ID Panel     Status: None (Preliminary result)   Collection Time: 04/05/20  3:49 PM   Specimen: BLOOD RIGHT HAND  Result Value Ref Range Status   Specimen Description   Final    BLOOD RIGHT HAND BOTTLES DRAWN AEROBIC AND ANAEROBIC   Special Requests   Final    Blood Culture adequate volume Performed at [facility], [address], [place], [place] [address]    Culture PENDING  Incomplete   Report Status PENDING  Incomplete     Radiology Studies: ECHOCARDIOGRAM COMPLETE  Result Date: 04/06/2020    ECHOCARDIOGRAM REPORT   Patient Name:   Keith Olson Date of Exam: 04/06/2020 Medical Rec #:  [id number]    Height:       66.0 in Accession #:    [id number]   Weight:       160.9 lb Date of Birth:  [birth year]    BSA:          1.823 m Patient Age:    76 years     BP:  111/53 mmHg Patient Gender: M            HR:           72 bpm. Exam Location:  [facility] Procedure: 2D Echo Indications:    Bacteremia R78.81  History:        Patient has prior history of Echocardiogram examinations, most                 recent 09/06/2017. CAD, Signs/Symptoms:Chest Pain; Risk                 Factors:Non-Smoker and Dyslipidemia.  Sonographer:    Leavy Cella RDCS (AE) Referring Phys: [place]  1. Left ventricular ejection fraction, by estimation, is 60 to 65%. The  left ventricle has normal function. The left ventricle has no regional wall motion abnormalities. There is mild left ventricular hypertrophy. Left ventricular diastolic parameters are consistent with Grade I diastolic dysfunction (impaired relaxation).  2. Right ventricular systolic function is normal. The right ventricular size is normal. Tricuspid regurgitation signal is inadequate for assessing PA pressure.  3. The mitral valve is normal in structure. No evidence of mitral valve regurgitation. No evidence of mitral stenosis.  4. The aortic valve is tricuspid. There is mild calcification of the aortic valve. Aortic valve regurgitation is mild. No aortic stenosis is present.  5. The inferior vena cava is normal in size with greater than 50% respiratory variability, suggesting right atrial pressure of 3 mmHg. Conclusion(s)/Recommendation(s): No intracardiac source of embolism detected on this transthoracic study. A transesophageal echocardiogram can be considered to exclude cardiac source of embolism if clinically indicated. FINDINGS  Left Ventricle: Left ventricular ejection fraction, by estimation, is 60 to 65%. The left ventricle has normal function. The left ventricle has no regional wall motion abnormalities. The left ventricular internal cavity size was normal in size. There is  mild left ventricular hypertrophy. Left ventricular diastolic parameters are consistent with Grade I diastolic dysfunction (impaired relaxation). Right Ventricle: The right ventricular size is normal. No increase in right ventricular wall thickness. Right ventricular systolic function is normal. Tricuspid regurgitation signal is inadequate for assessing PA pressure. Left Atrium: Left atrial size was normal in size. Right Atrium: Right atrial size was normal in size. Pericardium: There is no evidence of pericardial effusion. Mitral Valve: The mitral valve is normal in structure. No evidence of mitral valve regurgitation. No evidence of  mitral valve stenosis. Tricuspid Valve: The tricuspid valve is normal in structure. Tricuspid valve regurgitation is trivial. No evidence of tricuspid stenosis. Aortic Valve: The aortic valve is tricuspid. There is mild calcification of the aortic valve. Aortic valve regurgitation is mild. No aortic stenosis is present. Pulmonic Valve: The pulmonic valve was grossly normal. Pulmonic valve regurgitation is not visualized. No evidence of pulmonic stenosis. Aorta: The aortic root is normal in size and structure. Venous: The inferior vena cava is normal in size with greater than 50% respiratory variability, suggesting right atrial pressure of 3 mmHg. IAS/Shunts: No atrial level shunt detected by color flow Doppler.  LEFT VENTRICLE PLAX 2D LVIDd:         4.34 cm  Diastology LVIDs:         2.00 cm  LV e' medial:    7.07 cm/s LV PW:         1.15 cm  LV E/e' medial:  11.6 LV IVS:        1.03 cm  LV e' lateral:   8.16 cm/s LVOT diam:  1.90 cm  LV E/e' lateral: 10.1 LV SV:         73 LV SV Index:   40 LVOT Area:     2.84 cm  RIGHT VENTRICLE RV S prime:     23.50 cm/s TAPSE (M-mode): 3.2 cm LEFT ATRIUM             Index       RIGHT ATRIUM           Index LA diam:        3.30 cm 1.81 cm/m  RA Area:     14.20 cm LA Vol (A2C):   46.8 ml 25.67 ml/m RA Volume:   31.80 ml  17.44 ml/m LA Vol (A4C):   56.0 ml 30.71 ml/m LA Biplane Vol: 52.6 ml 28.85 ml/m  AORTIC VALVE LVOT Vmax:   110.12 cm/s LVOT Vmean:  79.536 cm/s LVOT VTI:    0.258 m  AORTA Ao Root diam: 3.00 cm MITRAL VALVE MV Area (PHT): 2.99 cm    SHUNTS MV Decel Time: 254 msec    Systemic VTI:  0.26 m MV E velocity: 82.30 cm/s  Systemic Diam: 1.90 cm MV A velocity: 89.10 cm/s MV E/A ratio:  0.92 Cherlynn Kaiser MD Electronically signed by Cherlynn Kaiser MD Signature Date/Time: 04/06/2020/1:20:50 PM    Final    Scheduled Meds: . abiraterone acetate  1,000 mg Oral Daily  . aspirin EC  81 mg Oral Daily  . diclofenac sodium  2 g Topical QID  . DULoxetine  60 mg  Oral Daily  . enoxaparin (LOVENOX) injection  40 mg Subcutaneous Q24H  . fentaNYL  1 patch Transdermal Q72H  . fluticasone  2 spray Each Nare Daily  . gabapentin  300 mg Oral TID  . ipratropium-albuterol  3 mL Nebulization TID  . loratadine  10 mg Oral Daily  . pantoprazole  40 mg Oral Daily  . polyethylene glycol  17 g Oral Daily  . predniSONE  5 mg Oral Q breakfast  . senna-docusate  2 tablet Oral QHS  . tamsulosin  0.4 mg Oral Daily   Continuous Infusions: . sodium chloride    .  ceFAZolin (ANCEF) IV Stopped (04/06/20 2878)     LOS: 2 days   Time spent: 35 mins   Dolora Ridgely Wynetta Emery, MD How to contact the [facility] Attending or Consulting provider [place] or covering provider during after hours [place], for this patient?  1. Check the care team in [facility] and look for a) attending/consulting [facility] provider listed and b) the [facility] team listed 2. Log into [email or web address] and use [place]'s universal password to access. If you do not have the password, please contact the hospital operator. 3. Locate the [facility] provider you are looking for under [facility] and page to a number that you can be directly reached. 4. If you still have difficulty reaching the provider, please page the [facility] (Director on Call) for the Hospitalists listed on amion for assistance.  04/06/2020, 2:03 PM

## 2020-04-06 NOTE — TOC Initial Note (Signed)
Transition of Care (TOC) - Initial/Assessment Note    Patient Details  Name: Keith Olson. MRN: [id number] Date of Birth: [birth year]  Transition of Care [facility]) CM/SW Contact:    Natasha Bence, LCSW Phone Number: 04/06/2020, 1:36 PM  Clinical Narrative:                 Patient is a 80 year old male admitted for Community Acquired Pneumonia. CSW identified patient's high readmission risk score. CSW conducted readmission risk assessment and initial assessment. Patient's son, Keith Olson reported that patient lives at home with his two other adult children who provide regular supervision. Patient's son reported that it was his father's request to stay in his home as long as possible and would prefer not to attend a skilled nursing facility. Patient's son reported that patient and family are agreeable to [facility] services. Patient's son also stated that accomodations are being made for patient to live on the ground floor of the home in order to assist patient with mobility. TOC to follow.   Expected Discharge Plan: [place] Barriers to Discharge: Continued Medical Work up   Patient Goals and CMS Choice Patient states their goals for this hospitalization and ongoing recovery are:: Return home with [facility] CMS Medicare.gov Compare Post Acute Care list provided to:: Patient Choice offered to / list presented to : Patient  Expected Discharge Plan and Services Expected Discharge Plan: [place] In-house Referral: NA Discharge Planning Services: NA Post Acute Care Choice: NA Living arrangements for the past 2 months: Single Family Home                 DME Arranged: N/A DME Agency: NA       HH Arranged: NA [place] Agency: NA        Prior Living Arrangements/Services Living arrangements for the past 2 months: Single Family Home Lives with:: [place] Patient language and need for interpreter reviewed:: No Do you feel safe going back to the place where  you live?: Yes      Need for Family Participation in Patient Care: Yes (Comment) Care giver support system in place?: Yes (comment)   Criminal Activity/Legal Involvement Pertinent to Current Situation/Hospitalization: No - Comment as needed  Activities of Daily Living Home Assistive Devices/Equipment: Walker (specify type),Eyeglasses ADL Screening (condition at time of admission) Patient's cognitive ability adequate to safely complete daily activities?: Yes Is the patient deaf or have difficulty hearing?: No Does the patient have difficulty seeing, even when wearing glasses/contacts?: No (wears glasses) Does the patient have difficulty concentrating, remembering, or making decisions?: Yes (based on pain medications) Patient able to express need for assistance with ADLs?: Yes Does the patient have difficulty dressing or bathing?: Yes Independently performs ADLs?: No Communication: Independent Dressing (OT): Dependent Is this a change from baseline?: Pre-admission baseline Grooming: Needs assistance Is this a change from baseline?: Pre-admission baseline Feeding: Independent Bathing: Needs assistance Is this a change from baseline?: Pre-admission baseline Toileting: Needs assistance Is this a change from baseline?: Pre-admission baseline In/Out Bed: Needs assistance Is this a change from baseline?: Pre-admission baseline Walks in Home: Needs assistance (uses walker at home) Is this a change from baseline?: Pre-admission baseline Does the patient have difficulty walking or climbing stairs?: Yes Weakness of Legs: Both Weakness of Arms/Hands: Both  Permission Sought/Granted Permission sought to share information with : Facility Contact Representative,Family Supports Permission granted to share information with : Yes, Verbal Permission Granted  Share Information with  NAME: Multimedia programmer  Permission granted to share info w AGENCY: Local HH agencies  Permission granted to share info w  Relationship: son  Permission granted to share info w Contact Information: [phone]  Emotional Assessment       Orientation: : Oriented to Self,Oriented to Situation,Oriented to Place,Oriented to  Time Alcohol / Substance Use: Not Applicable Psych Involvement: No (comment)  Admission diagnosis:  Cough [R05.9] Community acquired pneumonia [J18.9] Community acquired pneumonia of left lower lobe of lung [J18.9] Patient Active Problem List   Diagnosis Date Noted  . Community acquired pneumonia 04/04/2020  . DNR (do not resuscitate) 04/04/2020  . Acute bacterial sinusitis 04/04/2020  . Leukocytosis 04/04/2020  . Weakness 07/22/2019  . Acute lower UTI 07/22/2019  . Protein calorie malnutrition ([place]) 07/22/2019  . Urinary retention 07/18/2019  . Hyponatremia 06/29/2019  . Acute bilateral low back pain with sciatica   . Multiple lung nodules on CT   . 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 Unstable angina ([place])   . History of depression 09/05/2017  . Hypothyroidism 09/05/2017  . Depression, major, single episode, moderate ([place]) 11/26/2016  . Prostatitis 11/26/2016  . Rectal bleeding 02/25/2016  . Pain management contract signed 01/27/2016  . Constipation 04/22/2015  . Lower abdominal pain 04/22/2015  . Anxiety state 12/16/2014  . 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 Hyperlipidemia 05/22/2012  . BPH (benign prostatic hyperplasia) 05/22/2012  . Chest pain 02/24/2012  . CAD (coronary artery disease) 02/24/2012   PCP:  Dettinger, Fransisca Kaufmann, MD Pharmacy:   [facility] [address], [address] [place] [address] Phone: [phone] Fax: [phone]  [place], [place] - [address] [facility]. [address] [place]  [address] Phone: [phone] Fax: [place], [place] - [place] [address] Phone: [phone] Fax: [phone]  [id number] [place], [place] [address] Phone: [phone] Fax: [place], [place] [address] Phone: [phone] Fax: [phone]     Social Determinants of Health (SDOH) Interventions    Readmission Risk Interventions Readmission Risk Prevention Plan 04/06/2020 07/23/2019  Transportation Screening Complete Complete  PCP or Specialist Appt within 3-5 Days - Not Complete  HRI or Home Care Consult Complete Complete  Social Work Consult for [place] Planning/Counseling Complete Complete  Palliative Care Screening Not Applicable Not Applicable  Medication Review Press photographer) Complete Complete  Some recent data might be hidden

## 2020-04-06 NOTE — Progress Notes (Signed)
Repeat BCx for MSSA bacteremia are pending Reading of his TTE is pending.  Continue ancef

## 2020-04-06 NOTE — Progress Notes (Signed)
Patient unavailable at [place] having ECHO done.

## 2020-04-07 DIAGNOSIS — R7881 Bacteremia: Secondary | ICD-10-CM | POA: Diagnosis present

## 2020-04-07 DIAGNOSIS — I2510 Atherosclerotic heart disease of native coronary artery without angina pectoris: Secondary | ICD-10-CM | POA: Diagnosis not present

## 2020-04-07 DIAGNOSIS — J019 Acute sinusitis, unspecified: Secondary | ICD-10-CM | POA: Diagnosis not present

## 2020-04-07 DIAGNOSIS — J189 Pneumonia, unspecified organism: Secondary | ICD-10-CM | POA: Diagnosis not present

## 2020-04-07 DIAGNOSIS — N401 Enlarged prostate with lower urinary tract symptoms: Secondary | ICD-10-CM | POA: Diagnosis not present

## 2020-04-07 LAB — CULTURE, BLOOD (SINGLE)

## 2020-04-07 NOTE — TOC Progression Note (Addendum)
Transition of Care (TOC) - Progression Note    Patient Details  Name: Keith Olson. MRN: [id number] Date of Birth: 09-10-1940  Transition of Care [facility]) CM/SW Contact  Salome Arnt, [place] Phone Number: 04/07/2020, 1:27 PM  Clinical Narrative:  LCSW referred to [facility] for HHPT. [facility] accepts. LCSW left voicemail for son requesting return call to discuss. TOC will continue to follow.   Addendum: Discussed with son, [name] who is agreeable.     Expected Discharge Plan: [place] Barriers to Discharge: Continued Medical Work up  Expected Discharge Plan and Services Expected Discharge Plan: [place] In-house Referral: NA Discharge Planning Services: NA Post Acute Care Choice: NA Living arrangements for the past 2 months: Single Family Home                 DME Arranged: N/A DME Agency: NA       HH Arranged: NA HH Agency: NA         Social Determinants of Health (SDOH) Interventions    Readmission Risk Interventions Readmission Risk Prevention Plan 04/06/2020 07/23/2019  Transportation Screening Complete Complete  PCP or Specialist Appt within 3-5 Days - Not Complete  HRI or Home Care Consult Complete Complete  Social Work Consult for [place] Planning/Counseling Complete Complete  Palliative Care Screening Not Applicable Not Applicable  Medication Review Press photographer) Complete Complete  Some recent data might be hidden

## 2020-04-07 NOTE — Progress Notes (Signed)
80 yo M with hx of metastatic prostate cancer, adm with weakness, malaise. Found to have LLL pneumonia on CT.  He is now found to have MSSA bcx and ucx.  Repeat BCx 2-5 are ngtd TTE shows AVR and calcification. Consider TEE if he can tolerate.

## 2020-04-07 NOTE — Progress Notes (Signed)
Physical Therapy Treatment Patient Details Name: Keith Olson. MRN: [id number] DOB: 12-Feb-1941 Today's Date: 04/07/2020    History of Present Illness Keith Violet Cart. is a 80 y.o. 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     Patient presented via EMS today for concern of generalized weakness onset 2 days ago.  EMS has left prior to initial evaluation.  Per nursing report patient's vital signs and CBG were stable.  He is sent in for leg weakness bilaterally started 2 days ago, patient began to have upper extremity weakness beginning today.     Patient reports that he feels generally ill over the past 2 days he reports he is feeling weak but denies any focal or unilateral weakness.  He denies similar in the past.  He reports he has been compliant with all of his medications including his chemotherapy.  He reports he had 1 episode of emesis yesterday and has some mild generalized abdominal pain that does not radiate and there are no aggravating or alleviating factors that began 2 days ago as well.  He denies any fever, cough, chest pain/shortness of breath, numbness/tingling, weakness, dizziness or any additional concerns.    PT Comments    Patient presents supine in bed with HOB elevated. Patient is awake, alert and cooperative. Patient able to perform bed mobility independently, but did need assistance removing several layers of blankets from his feet. Once removed, he was able to sit up on the EOB by himself. Patient able to stand using RW for support and able to ambulate to bathroom for independent toileting. Patient then ambulated in hallway 120 feet using RW with min gaurd. He appears steady and with good overall awareness, but did need some help with turning as he tends to lead with walker, leaving his feet outside the BOS and becoming a little unsteady. Patient returned to room and seated at bedside. Educated patient on and performed seated LE  strengthening exercises. Patient did well and showed good return. Patient able to complete today's session with no supplemental oxygen and no increased complaint of fatigue. Patient then transferred from bed to chair, where he was left, seated with phone and callbell in reach. Patient will benefit from continued physical therapy in hospital and recommended venue below to increase strength, balance, endurance for safe ADLs and gait.     Follow Up Recommendations  Supervision/Assistance - 24 hour;Supervision for mobility/OOB;Home health PT     Equipment Recommendations  None recommended by PT    Recommendations for Other Services       Precautions / Restrictions Precautions Precautions: None    Mobility  Bed Mobility Overal bed mobility: Independent Bed Mobility: Supine to Sit     Supine to sit: Modified independent (Device/Increase time) Sit to supine: Modified independent (Device/Increase time)      Transfers Overall transfer level: Modified independent Equipment used: Rolling walker (2 wheeled)                Ambulation/Gait Ambulation/Gait assistance: Min guard Gait Distance (Feet): 120 Feet Assistive device: Rolling walker (2 wheeled) Gait Pattern/deviations: Trunk flexed;Decreased step length - right;Decreased step length - left     General Gait Details: Steady with gait, but requires verbal cues for safety when turning with RW   Stairs             Wheelchair Mobility    Modified Rankin (Stroke Patients Only)       Balance Overall balance assessment: Needs assistance Sitting-balance support:  Feet supported;No upper extremity supported Sitting balance-Leahy Scale: Fair Sitting balance - Comments: seated EOB     Standing balance-Leahy Scale: Fair Standing balance comment: standing with RW                            Cognition Arousal/Alertness: Awake/alert Behavior During Therapy: WFL for tasks assessed/performed Overall  Cognitive Status: Within Functional Limits for tasks assessed                                        Exercises General Exercises - Lower Extremity Ankle Circles/Pumps: Seated;10 reps;AROM Long Arc Quad: Strengthening;Seated;Both;10 reps Hip Flexion/Marching: Strengthening;Seated;Both;10 reps    General Comments        Pertinent Vitals/Pain Pain Assessment: No/denies pain    Home Living                      Prior Function            PT Goals (current goals can now be found in the care plan section) Acute Rehab PT Goals Patient Stated Goal: Return home PT Goal Formulation: With patient Time For Goal Achievement: 04/18/20 Potential to Achieve Goals: Good Progress towards PT goals: Progressing toward goals    Frequency    Min 3X/week      PT Plan Current plan remains appropriate    Co-evaluation              AM-PAC PT "6 Clicks" Mobility   Outcome Measure  Help needed turning from your back to your side while in a flat bed without using bedrails?: None Help needed moving from lying on your back to sitting on the side of a flat bed without using bedrails?: A Little Help needed moving to and from a bed to a chair (including a wheelchair)?: A Little Help needed standing up from a chair using your arms (e.g., wheelchair or bedside chair)?: A Little Help needed to walk in hospital room?: A Little Help needed climbing 3-5 steps with a railing? : A Lot 6 Click Score: 18    End of Session   Activity Tolerance: Patient tolerated treatment well Patient left: with call bell/phone within reach;in chair   PT Visit Diagnosis: Unsteadiness on feet (R26.81);Other abnormalities of gait and mobility (R26.89);Muscle weakness (generalized) (M62.81);History of falling (Z91.81)     Time: 1550-1620 PT Time Calculation (min) (ACUTE ONLY): 30 min  Charges:  $Therapeutic Exercise: 8-22 mins $Therapeutic Activity: 8-22 mins                      4:59 PM, 04/07/20 Josue Hector PT DPT  Physical Therapist with [facility]  [phone]

## 2020-04-07 NOTE — Progress Notes (Signed)
PROGRESS NOTE   Keith Olson.  KZS:[id number] DOB: 08-Oct-1940 DOA: 04/04/2020 PCP: Dettinger, Fransisca Kaufmann, MD   Chief Complaint  Patient presents with  . Weakness   Level of care: Med-Surg  Brief Admission History:  80 y.o. 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  He had one episode of emesis yesterday and generalized abdominal pain.  He denies chest pain.  He denies fever chills and shortness of breath.  He denies dizziness.  He does report sinus pain and congestion sore throat postnasal drainage.  He does report cough. ED Course: Patient was afebrile arrival blood pressure 149/73 pulse 93 respirations 13 pulse ox 93% on 2 L of oxygen.  Sodium 134 potassium 3.8 glucose 112, high-sensitivity troponin X, lactic acid 0.9, WBC 15.6, hemoglobin 12.6.  CT of the abdomen and pelvis revealed new left lower lobe pneumonia.  CT head without contrast with findings of a right maxillary sinusitis.  Patient was admitted for community-acquired pneumonia.  SARS 2 coronavirus test negative.  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 Community acquired pneumonia - Pt immunocompromised and high risk for deterioration.  Continue IV antibiotics, supportive measures.  He is improving.    2. Acute bacterial sinusitis - improving on IV antibiotics. Adding loratadine and flonase NS.  3. MSSA bacteremia -  changed antibiotic to IV cefazolin, repeat culture ordered, TTE ordered. ID following for final recommendations. 4. Chronic pain - resumed home treatment plan with duragesic patch 25 mcg and dilaudid every 8 hours PRN.  5. GAD - resume home alprazolam.  6. Constipation - opioid induced - added laxatives.  7. DNR - continue DNR order in hospital.  8. Leukocytosis - secondary to infection above. WBC normalized.  9. Generalized weakness - PT recommending SNF. Pt initially agreeable but now declines, wants home with HH. TOC consulted.    10. Metastatic prostate cancer on chemotherapy - castration sensitive, followed by Dr. Delton Coombes.  11. Hypothyroidism - he is not on any thyroid supplement at this time.    DVT prophylaxis: enoxaparin  Code Status: DNR   Family Communication:  t/c to son 2/5 no answer Disposition Plan: Home   Consults called: n/a   Admission status: INP  Status is: Inpatient  Remains inpatient appropriate because:Inpatient level of care appropriate due to severity of illness  Dispo: The patient is from: Home              Anticipated d/c is to: SNF              Anticipated d/c date is: 1-2 days              Patient currently is medically stable to d/c.   Difficult to place patient No  Consultants:   n/a  Procedures:   n/a  Antimicrobials:  Ceftriaxone 2/4>> Azithromycin 2/4>>   Subjective: Pt continues to report that his sinus pain is getting better.  He otherwise  has body joint aches and pains.    Objective: Vitals:   04/07/20 0032 04/07/20 0634 04/07/20 0708 04/07/20 1342  BP: 119/68 (!) 148/76 (!) 147/71 125/69  Pulse: 68 60 65 73  Resp: 16 14 16 16   Temp: 97.6 F (36.4 C) 97.7 F (36.5 C) 97.9 F (36.6 C) 98.3 F (36.8 C)  TempSrc: Oral Oral Oral Oral  SpO2:  96% 97% 97%  Weight:      Height:        Intake/Output Summary (Last 24 hours) at 04/07/2020 1351 Last data filed at 04/07/2020 1200 Gross per 24 hour  Intake 476 ml  Output --  Net  476 ml   Filed Weights   04/04/20 0945  Weight: 73 kg    Examination:  General exam: Appears calm and comfortable  Respiratory system: Clear to auscultation. Respiratory effort normal. Cardiovascular system: normal S1 & S2 heard. No JVD, murmurs, rubs, gallops or clicks. No pedal edema. Gastrointestinal system: Abdomen is nondistended, soft and nontender. No organomegaly or masses felt. Normal bowel sounds heard. Central nervous system: Alert and oriented. No focal neurological deficits. Extremities: Symmetric 5 x 5 power. Skin: No rashes, lesions or ulcers Psychiatry: Judgement and insight appear normal. Mood & affect appropriate.   Data Reviewed: I have personally reviewed following labs and imaging studies  CBC: Recent Labs  Lab 04/04/20 1102 04/04/20 1154 04/05/20 0400 04/06/20 0453  WBC 15.6*  --  8.9 7.3  NEUTROABS 12.2*  --  5.9 5.1  HGB 12.6* 13.6 11.8* 10.8*  HCT 39.4 40.0 36.4* 32.7*  MCV 98.0  --  95.5 94.5  PLT 215  --  221 149    Basic Metabolic Panel: Recent Labs  Lab 04/04/20 1102 04/04/20 1154 04/05/20 0400  NA 134* 136 136  K 3.8 3.9 3.5  CL 104 105 106  CO2 23  --  22  GLUCOSE 112* 106* 108*  BUN 10 9 7*  CREATININE 0.69 0.60* 0.57*  CALCIUM 8.7*  --  8.2*  MG  --   --  2.2    GFR: Estimated Creatinine Clearance: 68.7 mL/min (A) (by C-G formula based on SCr of 0.57 mg/dL (L)).  Liver Function Tests: Recent Labs  Lab 04/04/20 1102 04/05/20 0400  AST 24 19  ALT 21 17  ALKPHOS 54 50  BILITOT 0.6 0.6  PROT 6.5 6.3*  ALBUMIN 3.5 3.2*    CBG: Recent Labs  Lab 04/04/20 1000  GLUCAP 117*    Recent Results (from the past 240 hour(s))  SARS Coronavirus 2 by RT PCR (hospital order, performed in [name] hospital lab) Nasopharyngeal Nasopharyngeal Swab     Status: None   Collection Time: 04/04/20  9:42 AM   Specimen: Nasopharyngeal Swab  Result Value Ref Range Status   SARS Coronavirus 2 NEGATIVE NEGATIVE Final    Comment:  (NOTE) SARS-CoV-2 target nucleic acids are NOT DETECTED.  The SARS-CoV-2 RNA is generally detectable in upper and lower respiratory specimens during the acute phase of infection. The lowest concentration of SARS-CoV-2 viral copies this assay can detect is 250 copies / mL. A negative result does not preclude SARS-CoV-2 infection and should not be used as the sole basis for treatment or other patient management decisions.  A negative result may occur with improper specimen collection / handling, submission of specimen other than nasopharyngeal swab, presence of viral mutation(s) within the areas targeted by this assay, and inadequate number of viral copies (<250 copies / mL). A negative result must  be combined with clinical observations, patient history, and epidemiological information.  Fact Sheet for Patients:   StrictlyIdeas.no  Fact Sheet for Healthcare Providers: BankingDealers.co.za  This test is not yet approved or  cleared by the [place] FDA and has been authorized for detection and/or diagnosis of SARS-CoV-2 by FDA under an Emergency Use Authorization (EUA).  This EUA will remain in effect (meaning this test can be used) for the duration of the COVID-19 declaration under Section 564(b)(1) of the Act, 21 U.S.C. section 360bbb-3(b)(1), unless the authorization is terminated or revoked sooner.  Performed at [facility], [address], [place], [place] [address]   Culture, blood (single) w Reflex to ID Panel     Status: Abnormal   Collection Time: 04/04/20  1:14 PM   Specimen: BLOOD  Result Value Ref Range Status   Specimen Description   Final    BLOOD BLOOD RIGHT HAND Performed at [facility], [address], [place], [place] [address]    Special Requests   Final    Blood Culture results may not be optimal due to an inadequate volume of blood received in culture bottles BOTTLES DRAWN AEROBIC AND ANAEROBIC Performed at  [facility], [address], [place], [place] [address]    Culture  Setup Time   Final    GRAM POSITIVE COCCI AEROBIC BOTTLE ONLY Gram Stain Report Called to,Read Back By and Verified With: YATES T @ 0938 ON [id number] BY HENDERSON L. CRITICAL RESULT CALLED TO, READ BACK BY AND VERIFIED WITH: PHARMD L POOLE [id number] AT 1431 BY CM Performed at [facility], [place] [address], [place], [place] [address]    Culture STAPHYLOCOCCUS AUREUS (A)  Final   Report Status 04/07/2020 FINAL  Final   Organism ID, Bacteria STAPHYLOCOCCUS AUREUS  Final      Susceptibility   Staphylococcus aureus - MIC*    CIPROFLOXACIN <=0.5 SENSITIVE Sensitive     ERYTHROMYCIN >=8 RESISTANT Resistant     GENTAMICIN <=0.5 SENSITIVE Sensitive     OXACILLIN <=0.25 SENSITIVE Sensitive     TETRACYCLINE <=1 SENSITIVE Sensitive     VANCOMYCIN 1 SENSITIVE Sensitive     TRIMETH/SULFA <=10 SENSITIVE Sensitive     CLINDAMYCIN >=8 RESISTANT Resistant     RIFAMPIN <=0.5 SENSITIVE Sensitive     Inducible Clindamycin NEGATIVE Sensitive     * STAPHYLOCOCCUS AUREUS  Blood Culture ID Panel (Reflexed)     Status: Abnormal   Collection Time: 04/04/20  1:14 PM  Result Value Ref Range Status   Enterococcus faecalis NOT DETECTED NOT DETECTED Final   Enterococcus Faecium NOT DETECTED NOT DETECTED Final   Listeria monocytogenes NOT DETECTED NOT DETECTED Final   Staphylococcus species DETECTED (A) NOT DETECTED Final    Comment: CRITICAL RESULT CALLED TO, READ BACK BY AND VERIFIED WITH: PHARMD L POOLE [id number] AT 1431 BY CM    Staphylococcus aureus (BCID) DETECTED (A) NOT DETECTED Final    Comment: CRITICAL RESULT CALLED TO, READ BACK BY AND VERIFIED WITH: PHARMD L POOLE [id number] AT 1431 BY CM    Staphylococcus epidermidis NOT DETECTED NOT DETECTED Final   Staphylococcus lugdunensis NOT DETECTED NOT DETECTED Final   Streptococcus species NOT DETECTED NOT DETECTED Final   Streptococcus agalactiae NOT DETECTED NOT DETECTED Final    Streptococcus pneumoniae NOT DETECTED NOT DETECTED Final   Streptococcus pyogenes NOT DETECTED NOT DETECTED Final   A.calcoaceticus-baumannii NOT DETECTED NOT DETECTED Final   Bacteroides fragilis NOT DETECTED NOT DETECTED Final   Enterobacterales NOT  DETECTED NOT DETECTED Final   Enterobacter cloacae complex NOT DETECTED NOT DETECTED Final   Escherichia coli NOT DETECTED NOT DETECTED Final   Klebsiella aerogenes NOT DETECTED NOT DETECTED Final   Klebsiella oxytoca NOT DETECTED NOT DETECTED Final   Klebsiella pneumoniae NOT DETECTED NOT DETECTED Final   Proteus species NOT DETECTED NOT DETECTED Final   Salmonella species NOT DETECTED NOT DETECTED Final   Serratia marcescens NOT DETECTED NOT DETECTED Final   Haemophilus influenzae NOT DETECTED NOT DETECTED Final   Neisseria meningitidis NOT DETECTED NOT DETECTED Final   Pseudomonas aeruginosa NOT DETECTED NOT DETECTED Final   Stenotrophomonas maltophilia NOT DETECTED NOT DETECTED Final   Candida albicans NOT DETECTED NOT DETECTED Final   Candida auris NOT DETECTED NOT DETECTED Final   Candida glabrata NOT DETECTED NOT DETECTED Final   Candida krusei NOT DETECTED NOT DETECTED Final   Candida parapsilosis NOT DETECTED NOT DETECTED Final   Candida tropicalis NOT DETECTED NOT DETECTED Final   Cryptococcus neoformans/gattii NOT DETECTED NOT DETECTED Final   Meth resistant mecA/C and MREJ NOT DETECTED NOT DETECTED Final    Comment: Performed at [facility], [place] [address], [place], [place] [address]  MRSA PCR Screening     Status: None   Collection Time: 04/04/20  1:48 PM   Specimen: Nasal Mucosa; Nasopharyngeal  Result Value Ref Range Status   MRSA by PCR NEGATIVE NEGATIVE Final    Comment:        The GeneXpert MRSA Assay (FDA approved for NASAL specimens only), is one component of a comprehensive MRSA colonization surveillance program. It is not intended to diagnose MRSA infection nor to guide or monitor treatment for MRSA  infections. Performed at [facility], [address], [place], [place] [address]   Urine culture     Status: Abnormal   Collection Time: 04/04/20  3:44 PM   Specimen: Urine, Catheterized  Result Value Ref Range Status   Specimen Description   Final    URINE, CATHETERIZED Performed at [facility], [address] [place], [place] [address]    Special Requests   Final    NONE Performed at [facility], [address] [place], [place] [address]    Culture (A)  Final    >=100,000 COLONIES/mL GROUP B STREP(S.AGALACTIAE)ISOLATED TESTING AGAINST S. AGALACTIAE NOT ROUTINELY PERFORMED DUE TO PREDICTABILITY OF AMP/PEN/VAN SUSCEPTIBILITY. Performed at [facility], [place] [address], [place], [place] [address]    Report Status 04/06/2020 FINAL  Final  Culture, blood (Routine X 2) w Reflex to ID Panel     Status: None (Preliminary result)   Collection Time: 04/05/20  3:41 PM   Specimen: BLOOD LEFT HAND  Result Value Ref Range Status   Specimen Description BLOOD LEFT HAND BOTTLES DRAWN AEROBIC ONLY  Final   Special Requests Blood Culture adequate volume  Final   Culture   Final    NO GROWTH 2 DAYS Performed at [facility], [address] [place],  [address]    Report Status PENDING  Incomplete  Culture, blood (Routine X 2) w Reflex to ID Panel     Status: None (Preliminary result)   Collection Time: 04/05/20  3:49 PM   Specimen: BLOOD RIGHT HAND  Result Value Ref Range Status   Specimen Description   Final    BLOOD RIGHT HAND BOTTLES DRAWN AEROBIC AND ANAEROBIC   Special Requests Blood Culture adequate volume  Final   Culture   Final    NO GROWTH  2 DAYS Performed at [facility], [address], [place], [place] [address]    Report Status PENDING  Incomplete     Radiology Studies: ECHOCARDIOGRAM COMPLETE  Result Date: 04/06/2020    ECHOCARDIOGRAM REPORT   Patient Name:   Keith Olson Date of Exam: 04/06/2020 Medical Rec #:  [id number]    Height:       66.0 in Accession #:     [id number]   Weight:       160.9 lb Date of Birth:  May 07, 1940    BSA:          1.823 m Patient Age:    64 years     BP:           111/53 mmHg Patient Gender: M            HR:           72 bpm. Exam Location:  [facility] Procedure: 2D Echo Indications:    Bacteremia R78.81  History:        Patient has prior history of Echocardiogram examinations, most                 recent 09/06/2017. CAD, Signs/Symptoms:Chest Pain; Risk                 Factors:Non-Smoker and Dyslipidemia.  Sonographer:    Leavy Cella RDCS (AE) Referring Phys: [place]  1. Left ventricular ejection fraction, by estimation, is 60 to 65%. The left ventricle has normal function. The left ventricle has no regional wall motion abnormalities. There is mild left ventricular hypertrophy. Left ventricular diastolic parameters are consistent with Grade I diastolic dysfunction (impaired relaxation).  2. Right ventricular systolic function is normal. The right ventricular size is normal. Tricuspid regurgitation signal is inadequate for assessing PA pressure.  3. The mitral valve is normal in structure. No evidence of mitral valve regurgitation. No evidence of mitral stenosis.  4. The aortic valve is tricuspid. There is mild calcification of the aortic valve. Aortic valve regurgitation is mild. No aortic stenosis is present.  5. The inferior vena cava is normal in size with greater than 50% respiratory variability, suggesting right atrial pressure of 3 mmHg. Conclusion(s)/Recommendation(s): No intracardiac source of embolism detected on this transthoracic study. A transesophageal echocardiogram can be considered to exclude cardiac source of embolism if clinically indicated. FINDINGS  Left Ventricle: Left ventricular ejection fraction, by estimation, is 60 to 65%. The left ventricle has normal function. The left ventricle has no regional wall motion abnormalities. The left ventricular internal cavity size was normal in size. There is   mild left ventricular hypertrophy. Left ventricular diastolic parameters are consistent with Grade I diastolic dysfunction (impaired relaxation). Right Ventricle: The right ventricular size is normal. No increase in right ventricular wall thickness. Right ventricular systolic function is normal. Tricuspid regurgitation signal is inadequate for assessing PA pressure. Left Atrium: Left atrial size was normal in size. Right Atrium: Right atrial size was normal in size. Pericardium: There is no evidence of pericardial effusion. Mitral Valve: The mitral valve is normal in structure. No evidence of mitral valve regurgitation. No evidence of mitral valve stenosis. Tricuspid Valve: The tricuspid valve is normal in structure. Tricuspid valve regurgitation is trivial. No evidence of tricuspid stenosis. Aortic Valve: The aortic valve is tricuspid. There is mild calcification of the aortic valve. Aortic valve regurgitation is mild. No aortic stenosis is present. Pulmonic Valve: The pulmonic valve was grossly normal. Pulmonic valve regurgitation is  not visualized. No evidence of pulmonic stenosis. Aorta: The aortic root is normal in size and structure. Venous: The inferior vena cava is normal in size with greater than 50% respiratory variability, suggesting right atrial pressure of 3 mmHg. IAS/Shunts: No atrial level shunt detected by color flow Doppler.  LEFT VENTRICLE PLAX 2D LVIDd:         4.34 cm  Diastology LVIDs:         2.00 cm  LV e' medial:    7.07 cm/s LV PW:         1.15 cm  LV E/e' medial:  11.6 LV IVS:        1.03 cm  LV e' lateral:   8.16 cm/s LVOT diam:     1.90 cm  LV E/e' lateral: 10.1 LV SV:         73 LV SV Index:   40 LVOT Area:     2.84 cm  RIGHT VENTRICLE RV S prime:     23.50 cm/s TAPSE (M-mode): 3.2 cm LEFT ATRIUM             Index       RIGHT ATRIUM           Index LA diam:        3.30 cm 1.81 cm/m  RA Area:     14.20 cm LA Vol (A2C):   46.8 ml 25.67 ml/m RA Volume:   31.80 ml  17.44 ml/m LA Vol  (A4C):   56.0 ml 30.71 ml/m LA Biplane Vol: 52.6 ml 28.85 ml/m  AORTIC VALVE LVOT Vmax:   110.12 cm/s LVOT Vmean:  79.536 cm/s LVOT VTI:    0.258 m  AORTA Ao Root diam: 3.00 cm MITRAL VALVE MV Area (PHT): 2.99 cm    SHUNTS MV Decel Time: 254 msec    Systemic VTI:  0.26 m MV E velocity: 82.30 cm/s  Systemic Diam: 1.90 cm MV A velocity: 89.10 cm/s MV E/A ratio:  0.92 Cherlynn Kaiser MD Electronically signed by Cherlynn Kaiser MD Signature Date/Time: 04/06/2020/1:20:50 PM    Final    Scheduled Meds: . abiraterone acetate  1,000 mg Oral Daily  . aspirin EC  81 mg Oral Daily  . diclofenac sodium  2 g Topical QID  . DULoxetine  60 mg Oral Daily  . enoxaparin (LOVENOX) injection  40 mg Subcutaneous Q24H  . fentaNYL  1 patch Transdermal Q72H  . fluticasone  2 spray Each Nare Daily  . gabapentin  300 mg Oral TID  . ipratropium-albuterol  3 mL Nebulization TID  . loratadine  10 mg Oral Daily  . pantoprazole  40 mg Oral Daily  . polyethylene glycol  17 g Oral Daily  . predniSONE  5 mg Oral Q breakfast  . senna-docusate  2 tablet Oral QHS  . tamsulosin  0.4 mg Oral Daily   Continuous Infusions: . sodium chloride    .  ceFAZolin (ANCEF) IV Stopped (04/07/20 0620)     LOS: 3 days   Time spent: 35 mins   Lyllian Gause Wynetta Emery, MD How to contact the [facility] Attending or Consulting provider [place] or covering provider during after hours [place], for this patient?  1. Check the care team in [facility] and look for a) attending/consulting [facility] provider listed and b) the [facility] team listed 2. Log into [email or web address] and use Mobile's universal password to access. If you do not have the password, please contact the hospital operator. 3. Locate the [facility] provider  you are looking for under [facility] and page to a number that you can be directly reached. 4. If you still have difficulty reaching the provider, please page the [facility] (Director on Call) for the Hospitalists listed on amion for assistance.  04/07/2020, 1:51  PM

## 2020-04-08 ENCOUNTER — Inpatient Hospital Stay (HOSPITAL_COMMUNITY): Payer: Medicare Other | Admitting: Certified Registered Nurse Anesthetist

## 2020-04-08 ENCOUNTER — Inpatient Hospital Stay: Payer: Self-pay

## 2020-04-08 ENCOUNTER — Encounter (HOSPITAL_COMMUNITY)
Admission: EM | Disposition: A | Discharge status: Home Health | Payer: Self-pay | Source: Home / Self Care | Attending: Family Medicine

## 2020-04-08 ENCOUNTER — Encounter (HOSPITAL_COMMUNITY): Payer: Self-pay | Admitting: Family Medicine

## 2020-04-08 ENCOUNTER — Inpatient Hospital Stay (HOSPITAL_COMMUNITY): Payer: Medicare Other

## 2020-04-08 DIAGNOSIS — J019 Acute sinusitis, unspecified: Secondary | ICD-10-CM | POA: Diagnosis not present

## 2020-04-08 DIAGNOSIS — I2510 Atherosclerotic heart disease of native coronary artery without angina pectoris: Secondary | ICD-10-CM | POA: Diagnosis not present

## 2020-04-08 DIAGNOSIS — I351 Nonrheumatic aortic (valve) insufficiency: Secondary | ICD-10-CM

## 2020-04-08 DIAGNOSIS — J189 Pneumonia, unspecified organism: Secondary | ICD-10-CM | POA: Diagnosis not present

## 2020-04-08 DIAGNOSIS — R7881 Bacteremia: Secondary | ICD-10-CM

## 2020-04-08 DIAGNOSIS — I340 Nonrheumatic mitral (valve) insufficiency: Secondary | ICD-10-CM

## 2020-04-08 DIAGNOSIS — N401 Enlarged prostate with lower urinary tract symptoms: Secondary | ICD-10-CM | POA: Diagnosis not present

## 2020-04-08 HISTORY — PX: TEE WITHOUT CARDIOVERSION: SHX5443

## 2020-04-08 SURGERY — ECHOCARDIOGRAM, TRANSESOPHAGEAL
Anesthesia: General

## 2020-04-08 MED ORDER — CHLORHEXIDINE GLUCONATE CLOTH 2 % EX PADS
6.0000 | MEDICATED_PAD | Freq: Every day | CUTANEOUS | Status: DC
  Administered 2020-04-09: 6 via TOPICAL

## 2020-04-08 MED ORDER — LACTATED RINGERS IV SOLN: INTRAVENOUS | Status: DC

## 2020-04-08 MED ORDER — LIDOCAINE HCL (CARDIAC) PF 100 MG/5ML IV SOSY
PREFILLED_SYRINGE | INTRAVENOUS | Status: DC | PRN
  Administered 2020-04-08: 50 mg via INTRAVENOUS

## 2020-04-08 MED ORDER — SODIUM CHLORIDE 0.9% FLUSH: 10.0000 mL | INTRAVENOUS | Status: DC | PRN

## 2020-04-08 MED ORDER — LACTATED RINGERS IV SOLN: INTRAVENOUS | Status: DC | PRN

## 2020-04-08 MED ORDER — SODIUM CHLORIDE 0.9 % IV SOLN: INTRAVENOUS | Status: DC

## 2020-04-08 MED ORDER — PROPOFOL 10 MG/ML IV BOLUS
INTRAVENOUS | Status: DC | PRN
  Administered 2020-04-08: 20 mg via INTRAVENOUS
  Administered 2020-04-08: 60 mg via INTRAVENOUS
  Administered 2020-04-08: 30 mg via INTRAVENOUS

## 2020-04-08 MED ORDER — SODIUM CHLORIDE 0.9% FLUSH
10.0000 mL | Freq: Two times a day (BID) | INTRAVENOUS | Status: DC
  Administered 2020-04-08 – 2020-04-09 (×2): 10 mL

## 2020-04-08 MED ORDER — SODIUM CHLORIDE BACTERIOSTATIC 0.9 % IJ SOLN
INTRAMUSCULAR | Status: AC
  Filled 2020-04-08: qty 20

## 2020-04-08 MED ORDER — LIDOCAINE VISCOUS HCL 2 % MT SOLN
OROMUCOSAL | Status: DC | PRN
  Administered 2020-04-08: 1 via OROMUCOSAL

## 2020-04-08 NOTE — Progress Notes (Signed)
PROGRESS NOTE   Keith Olson.  IBB:[id number] DOB: 1941/02/20 DOA: 04/04/2020 PCP: Dettinger, Fransisca Kaufmann, MD   Chief Complaint  Patient presents with  . Weakness   Level of care: Med-Surg  Brief Admission History:  80 y.o. 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  He had one episode of emesis yesterday and generalized abdominal pain.  He denies chest pain.  He denies fever chills and shortness of breath.  He denies dizziness.  He does report sinus pain and congestion sore throat postnasal drainage.  He does report cough. ED Course: Patient was afebrile arrival blood pressure 149/73 pulse 93 respirations 13 pulse ox 93% on 2 L of oxygen.  Sodium 134 potassium 3.8 glucose 112, high-sensitivity troponin X, lactic acid 0.9, WBC 15.6, hemoglobin 12.6.  CT of the abdomen and pelvis revealed new left lower lobe pneumonia.  CT head without contrast with findings of a right maxillary sinusitis.  Patient was admitted for community-acquired pneumonia.  SARS 2 coronavirus test negative.  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 Community acquired pneumonia - Pt clinically much improved.  Continue IV antibiotics, supportive measures.  He is improving.    2. Acute bacterial sinusitis - improving on IV antibiotics. Adding loratadine and flonase NS.  3. MSSA bacteremia - changed antibiotic to IV  cefazolin, repeat culture ordered, TEE no vegetations seen. ID recommending 2 weeks of cefazolin.  Pt agreeable to home IV antibiotics.  PICC line ordered.  Plan to discharge home 04/09/20.  4. Chronic pain - resumed home treatment plan with duragesic patch 25 mcg and dilaudid every 8 hours PRN.  5. GAD - resumed home alprazolam.  6. Constipation - opioid induced - added laxatives.  7. DNR - continue DNR order in hospital.  8. Leukocytosis - secondary to infection above. WBC normalized.  9. Generalized weakness - PT recommending SNF. Pt initially agreeable but now declines, wants home with HH. TOC consulted.    10. Metastatic prostate cancer on chemotherapy - castration sensitive, followed by Dr. Delton Coombes.  11. Hypothyroidism - he is not on any thyroid supplement at this time.    DVT prophylaxis: enoxaparin  Code Status: DNR   Family Communication:  t/c to son 2/5 no answer Disposition Plan: Home   Consults called: n/a   Admission status: INP  Status is: Inpatient  Remains inpatient appropriate because:Inpatient level of care appropriate due to severity of illness  Dispo: The patient is from: Home              Anticipated d/c is to: SNF              Anticipated d/c date is: 1-2 days              Patient currently is medically stable to d/c.   Difficult to place patient No  Consultants:   n/a  Procedures:   n/a  Antimicrobials:  Ceftriaxone 2/4>> Azithromycin  2/4>>   Subjective: Pt continues reports pain is better controlled today.    Objective: Vitals:   04/08/20 1107 04/08/20 1115 04/08/20 1157 04/08/20 1445  BP:  138/74 132/65   Pulse: 64 61 70   Resp: 13 13 15    Temp:  98.1 F (36.7 C) 98.3 F (36.8 C)   TempSrc:      SpO2: 98% 95% 96% 94%  Weight:      Height:        Intake/Output Summary (Last 24 hours) at 04/08/2020 1516 Last data filed at 04/08/2020 1041 Gross per 24 hour  Intake 780 ml  Output 0 ml  Net 780 ml   Filed Weights   04/04/20 0945   Weight: 73 kg   Examination:  General exam: Appears calm and comfortable  Respiratory system: Clear to auscultation. Respiratory effort normal. Cardiovascular system: normal S1 & S2 heard. No JVD, murmurs, rubs, gallops or clicks. No pedal edema. Gastrointestinal system: Abdomen is nondistended, soft and nontender. No organomegaly or masses felt. Normal bowel sounds heard. Central nervous system: Alert and oriented. No focal neurological deficits. Extremities: Symmetric 5 x 5 power. Skin: No rashes, lesions or ulcers Psychiatry: Judgement and insight appear normal. Mood & affect appropriate.   Data Reviewed: I have personally reviewed following labs and imaging studies  CBC: Recent Labs  Lab 04/04/20 1102 04/04/20 1154 04/05/20 0400 04/06/20 0453  WBC 15.6*  --  8.9 7.3  NEUTROABS 12.2*  --  5.9 5.1  HGB 12.6* 13.6 11.8* 10.8*  HCT 39.4 40.0 36.4* 32.7*  MCV 98.0  --  95.5 94.5  PLT 215  --  221 921    Basic Metabolic Panel: Recent Labs  Lab 04/04/20 1102 04/04/20 1154 04/05/20 0400  NA 134* 136 136  K 3.8 3.9 3.5  CL 104 105 106  CO2 23  --  22  GLUCOSE 112* 106* 108*  BUN 10 9 7*  CREATININE 0.69 0.60* 0.57*  CALCIUM 8.7*  --  8.2*  MG  --   --  2.2    GFR: Estimated Creatinine Clearance: 68.7 mL/min (A) (by C-G formula based on SCr of 0.57 mg/dL (L)).  Liver Function Tests: Recent Labs  Lab 04/04/20 1102 04/05/20 0400  AST 24 19  ALT 21 17  ALKPHOS 54 50  BILITOT 0.6 0.6  PROT 6.5 6.3*  ALBUMIN 3.5 3.2*    CBG: Recent Labs  Lab 04/04/20 1000  GLUCAP 117*    Recent Results (from the past 240 hour(s))  SARS Coronavirus 2 by RT PCR (hospital order, performed in [facility] hospital lab) Nasopharyngeal Nasopharyngeal Swab     Status: None   Collection Time: 04/04/20  9:42 AM   Specimen: Nasopharyngeal Swab  Result Value Ref Range Status   SARS Coronavirus 2 NEGATIVE NEGATIVE Final    Comment: (NOTE) SARS-CoV-2 target nucleic acids are NOT  DETECTED.  The SARS-CoV-2 RNA is generally detectable in upper and lower respiratory specimens during the acute phase of infection. The lowest concentration of SARS-CoV-2 viral copies this assay can detect is 250 copies / mL. A negative result does not preclude SARS-CoV-2 infection and should not be used as the sole basis for treatment or other patient management decisions.  A negative result may occur with improper specimen collection / handling, submission of specimen other than nasopharyngeal swab, presence of viral mutation(s) within the areas targeted by this assay, and inadequate number of viral copies (<250 copies / mL). A negative result must be combined  with clinical observations, patient history, and epidemiological information.  Fact Sheet for Patients:   StrictlyIdeas.no  Fact Sheet for Healthcare Providers: BankingDealers.co.za  This test is not yet approved or  cleared by the [place] FDA and has been authorized for detection and/or diagnosis of SARS-CoV-2 by FDA under an Emergency Use Authorization (EUA).  This EUA will remain in effect (meaning this test can be used) for the duration of the COVID-19 declaration under Section 564(b)(1) of the Act, 21 U.S.C. section 360bbb-3(b)(1), unless the authorization is terminated or revoked sooner.  Performed at [facility], [address], [place], [place] [address]   Culture, blood (single) w Reflex to ID Panel     Status: Abnormal   Collection Time: 04/04/20  1:14 PM   Specimen: BLOOD  Result Value Ref Range Status   Specimen Description   Final    BLOOD BLOOD RIGHT HAND Performed at [facility], [address] [place], [place] [address]    Special Requests   Final    Blood Culture results may not be optimal due to an inadequate volume of blood received in culture bottles BOTTLES DRAWN AEROBIC AND ANAEROBIC Performed at [facility], [address] [place], [place]  [address]    Culture  Setup Time   Final    GRAM POSITIVE COCCI AEROBIC BOTTLE ONLY Gram Stain Report Called to,Read Back By and Verified With: YATES T @ 0938 ON [id number] BY HENDERSON L. CRITICAL RESULT CALLED TO, READ BACK BY AND VERIFIED WITH: PHARMD L POOLE [id number] AT 1431 BY CM Performed at [facility], [place] [address] [place], [place] [address]    Culture STAPHYLOCOCCUS AUREUS (A)  Final   Report Status 04/07/2020 FINAL  Final   Organism ID, Bacteria STAPHYLOCOCCUS AUREUS  Final      Susceptibility   Staphylococcus aureus - MIC*    CIPROFLOXACIN <=0.5 SENSITIVE Sensitive     ERYTHROMYCIN >=8 RESISTANT Resistant     GENTAMICIN <=0.5 SENSITIVE Sensitive     OXACILLIN <=0.25 SENSITIVE Sensitive     TETRACYCLINE <=1 SENSITIVE Sensitive     VANCOMYCIN 1 SENSITIVE Sensitive     TRIMETH/SULFA <=10 SENSITIVE Sensitive     CLINDAMYCIN >=8 RESISTANT Resistant     RIFAMPIN <=0.5 SENSITIVE Sensitive     Inducible Clindamycin NEGATIVE Sensitive     * STAPHYLOCOCCUS AUREUS  Blood Culture ID Panel (Reflexed)     Status: Abnormal   Collection Time: 04/04/20  1:14 PM  Result Value Ref Range Status   Enterococcus faecalis NOT DETECTED NOT DETECTED Final   Enterococcus Faecium NOT DETECTED NOT DETECTED Final   Listeria monocytogenes NOT DETECTED NOT DETECTED Final   Staphylococcus species DETECTED (A) NOT DETECTED Final    Comment: CRITICAL RESULT CALLED TO, READ BACK BY AND VERIFIED WITH: PHARMD L POOLE [id number] AT 1431 BY CM    Staphylococcus aureus (BCID) DETECTED (A) NOT DETECTED Final    Comment: CRITICAL RESULT CALLED TO, READ BACK BY AND VERIFIED WITH: PHARMD L POOLE [id number] AT 1431 BY CM    Staphylococcus epidermidis NOT DETECTED NOT DETECTED Final   Staphylococcus lugdunensis NOT DETECTED NOT DETECTED Final   Streptococcus species NOT DETECTED NOT DETECTED Final   Streptococcus agalactiae NOT DETECTED NOT DETECTED Final   Streptococcus pneumoniae NOT DETECTED NOT DETECTED Final    Streptococcus pyogenes NOT DETECTED NOT DETECTED Final   A.calcoaceticus-baumannii NOT DETECTED NOT DETECTED Final   Bacteroides fragilis NOT DETECTED NOT DETECTED Final   Enterobacterales NOT DETECTED NOT  DETECTED Final   Enterobacter cloacae complex NOT DETECTED NOT DETECTED Final   Escherichia coli NOT DETECTED NOT DETECTED Final   Klebsiella aerogenes NOT DETECTED NOT DETECTED Final   Klebsiella oxytoca NOT DETECTED NOT DETECTED Final   Klebsiella pneumoniae NOT DETECTED NOT DETECTED Final   Proteus species NOT DETECTED NOT DETECTED Final   Salmonella species NOT DETECTED NOT DETECTED Final   Serratia marcescens NOT DETECTED NOT DETECTED Final   Haemophilus influenzae NOT DETECTED NOT DETECTED Final   Neisseria meningitidis NOT DETECTED NOT DETECTED Final   Pseudomonas aeruginosa NOT DETECTED NOT DETECTED Final   Stenotrophomonas maltophilia NOT DETECTED NOT DETECTED Final   Candida albicans NOT DETECTED NOT DETECTED Final   Candida auris NOT DETECTED NOT DETECTED Final   Candida glabrata NOT DETECTED NOT DETECTED Final   Candida krusei NOT DETECTED NOT DETECTED Final   Candida parapsilosis NOT DETECTED NOT DETECTED Final   Candida tropicalis NOT DETECTED NOT DETECTED Final   Cryptococcus neoformans/gattii NOT DETECTED NOT DETECTED Final   Meth resistant mecA/C and MREJ NOT DETECTED NOT DETECTED Final    Comment: Performed at [facility], [place] [address], [place], [place] [address]  MRSA PCR Screening     Status: None   Collection Time: 04/04/20  1:48 PM   Specimen: Nasal Mucosa; Nasopharyngeal  Result Value Ref Range Status   MRSA by PCR NEGATIVE NEGATIVE Final    Comment:        The GeneXpert MRSA Assay (FDA approved for NASAL specimens only), is one component of a comprehensive MRSA colonization surveillance program. It is not intended to diagnose MRSA infection nor to guide or monitor treatment for MRSA infections. Performed at [facility], [address],  [place], [place] [address]   Urine culture     Status: Abnormal   Collection Time: 04/04/20  3:44 PM   Specimen: Urine, Catheterized  Result Value Ref Range Status   Specimen Description   Final    URINE, CATHETERIZED Performed at [facility], [address] [place], [place] [address]    Special Requests   Final    NONE Performed at [facility], [address], [place], [place] [address]    Culture (A)  Final    >=100,000 COLONIES/mL GROUP B STREP(S.AGALACTIAE)ISOLATED TESTING AGAINST S. AGALACTIAE NOT ROUTINELY PERFORMED DUE TO PREDICTABILITY OF AMP/PEN/VAN SUSCEPTIBILITY. Performed at [facility], [place] [address], [place], [place] [address]    Report Status 04/06/2020 FINAL  Final  Culture, blood (Routine X 2) w Reflex to ID Panel     Status: None (Preliminary result)   Collection Time: 04/05/20  3:41 PM   Specimen: BLOOD LEFT HAND  Result Value Ref Range Status   Specimen Description BLOOD LEFT HAND BOTTLES DRAWN AEROBIC ONLY  Final   Special Requests Blood Culture adequate volume  Final   Culture   Final    NO GROWTH 3 DAYS Performed at [facility], [address], [place], [place] [address]    Report Status PENDING  Incomplete  Culture, blood (Routine X 2) w Reflex to ID Panel     Status: None (Preliminary result)   Collection Time: 04/05/20  3:49 PM   Specimen: BLOOD RIGHT HAND  Result Value Ref Range Status   Specimen Description   Final    BLOOD RIGHT HAND BOTTLES DRAWN AEROBIC AND ANAEROBIC   Special Requests Blood Culture adequate volume  Final   Culture   Final    NO GROWTH 3 DAYS  Performed at [facility], [address] [place], [place] [address]    Report Status PENDING  Incomplete     Radiology Studies: ECHO TEE  Result Date: 04/08/2020    TRANSESOPHOGEAL ECHO REPORT   Patient Name:   Keith Olson. Date of Exam: 04/08/2020 Medical Rec #:  [id number]        Height:       66.0 in Accession #:    [id number]       Weight:       160.9 lb Date of Birth:   [birth year]        BSA:          1.823 m Patient Age:    69 years         BP:           117/59 mmHg Patient Gender: M                HR:           57 bpm. Exam Location:  [facility] Procedure: Transesophageal Echo Indications:    Bacteremia 790.7 / R78.81  History:        Patient has prior history of Echocardiogram examinations, most                 recent 04/06/2020. CAD; Risk Factors:Dyslipidemia and Non-Smoker.                 DNR.  Sonographer:    Leavy Cella RDCS (AE) Referring Phys: [id number] Alphonse Guild BRANCH PROCEDURE: The transesophogeal probe was passed without difficulty through the esophogus of the patient. Local oropharyngeal anesthetic was provided with viscous lidocaine. Sedation performed by performing physician. The patient developed no complications during the procedure. IMPRESSIONS  1. Left ventricular ejection fraction, by estimation, is 60 to 65%. The left ventricle has normal function.  2. Right ventricular systolic function is normal. The right ventricular size is normal.  3. No left atrial/left atrial appendage thrombus was detected. The LAA emptying velocity was 70 cm/s.  4. The mitral valve is normal in structure. Mild mitral valve regurgitation. No evidence of mitral stenosis.  5. The aortic valve is tricuspid. Aortic valve regurgitation is mild. No aortic stenosis is present. Conclusion(s)/Recommendation(s): No evidence of vegetation/infective endocarditis on this transesophageal echocardiogram. FINDINGS  Left Ventricle: Left ventricular ejection fraction, by estimation, is 60 to 65%. The left ventricle has normal function. The left ventricular internal cavity size was normal in size. Right Ventricle: The right ventricular size is normal. No increase in right ventricular wall thickness. Right ventricular systolic function is normal. Left Atrium: Left atrial size was normal in size. No left atrial/left atrial appendage thrombus was detected. The LAA emptying velocity was 70 cm/s. Right  Atrium: Right atrial size was normal in size. Pericardium: There is no evidence of pericardial effusion. Mitral Valve: The mitral valve is normal in structure. Mild mitral valve regurgitation. No evidence of mitral valve stenosis. Tricuspid Valve: The tricuspid valve is normal in structure. Tricuspid valve regurgitation is trivial. No evidence of tricuspid stenosis. Aortic Valve: The aortic valve is tricuspid. Aortic valve regurgitation is mild. No aortic stenosis is present. Pulmonic Valve: The pulmonic valve was not well visualized. Pulmonic valve regurgitation is mild. No evidence of pulmonic stenosis. Aorta: Mild plaque in the visualized portion of the descending aorta. The aortic root is normal in size and structure. IAS/Shunts: No atrial level shunt detected by color flow Doppler.   AORTA Ao Root diam: 3.50 cm Roderic Palau  Branch MD Electronically signed by Carlyle Dolly MD Signature Date/Time: 04/08/2020/11:22:12 AM    Final    [place] EKG SITE RITE  Result Date: 04/08/2020 If Site Rite image not attached, placement could not be confirmed due to current cardiac rhythm.  Scheduled Meds: . abiraterone acetate  1,000 mg Oral Daily  . aspirin EC  81 mg Oral Daily  . diclofenac sodium  2 g Topical QID  . DULoxetine  60 mg Oral Daily  . enoxaparin (LOVENOX) injection  40 mg Subcutaneous Q24H  . fentaNYL  1 patch Transdermal Q72H  . fluticasone  2 spray Each Nare Daily  . gabapentin  300 mg Oral TID  . ipratropium-albuterol  3 mL Nebulization TID  . loratadine  10 mg Oral Daily  . pantoprazole  40 mg Oral Daily  . polyethylene glycol  17 g Oral Daily  . predniSONE  5 mg Oral Q breakfast  . senna-docusate  2 tablet Oral QHS  . sodium chloride bacteriostatic      . tamsulosin  0.4 mg Oral Daily   Continuous Infusions: . sodium chloride    .  ceFAZolin (ANCEF) IV 2 g (04/08/20 1447)     LOS: 4 days   Time spent: 96 mins   Johan Creveling Wynetta Emery, MD How to contact the [facility] Attending or Consulting  provider [place] or covering provider during after hours [place], for this patient?  1. Check the care team in [facility] and look for a) attending/consulting TRH provider listed and b) the [facility] team listed 2. Log into [email or web address] and use [place]'s universal password to access. If you do not have the password, please contact the hospital operator. 3. Locate the [facility] provider you are looking for under [facility] and page to a number that you can be directly reached. 4. If you still have difficulty reaching the provider, please page the [facility] (Director on Call) for the Hospitalists listed on amion for assistance.  04/08/2020, 3:16 PM

## 2020-04-08 NOTE — TOC Progression Note (Signed)
Transition of Care (TOC) - Progression Note    Patient Details  Name: Keith Olson. MRN: [id number] Date of Birth: [birth year]  Transition of Care [facility]) CM/SW Contact  Shade Flood, LCSW Phone Number: 04/08/2020, 2:51 PM  Clinical Narrative:     TOC following. Per MD, pt will need IV anbx at home upon dc. Spoke with pt to discuss needs and options for either SNF or iV anbx at home. Pt elects home IV plan and asked TOC to contact his son, [name].   Spoke with Ruth and explained options. [name] expresses that he will be available to assist with pt's care and he would like for him to come home. Discussed Infusion and [facility] provider options and referred as requested.  Anticipating likely dc home tomorrow. TOC will follow.  Expected Discharge Plan: [place] Barriers to Discharge: Continued Medical Work up  Expected Discharge Plan and Services Expected Discharge Plan: [place] In-house Referral: NA Discharge Planning Services: NA Post Acute Care Choice: NA Living arrangements for the past 2 months: Single Family Home                 DME Arranged: IV pump/equipment DME Agency: NA Date DME Agency Contacted: 04/08/20   Representative spoke with at DME Agency: [place]: RN,PT [place] Agency: NA Date [place]: 04/08/20       Social Determinants of Health (SDOH) Interventions    Readmission Risk Interventions Readmission Risk Prevention Plan 04/06/2020 07/23/2019  Transportation Screening Complete Complete  PCP or Specialist Appt within 3-5 Days - Not Complete  HRI or [place] Complete Complete  Social Work Consult for [place] Planning/Counseling Complete Complete  Palliative Care Screening Not Applicable Not Applicable  Medication Review Press photographer) Complete Complete  Some recent data might be hidden

## 2020-04-08 NOTE — Progress Notes (Signed)
Patient back from TEE procedure in PACU. Patient alert and oriented with no complaints at this time. Messaged MD about diet order and made patient aware. Will continue to monitor.

## 2020-04-08 NOTE — Progress Notes (Signed)
*  PRELIMINARY RESULTS* Echocardiogram Echocardiogram Transesophageal has been performed.  Leavy Cella 04/08/2020, 11:13 AM

## 2020-04-08 NOTE — H&P (Signed)
Contacted by primary team for H&P in this bacteremic patient. He is on chronic home opiates and benzodiazepines and thus we will ask for assistance from anesthesiology for the procedure. Plan for TEE today with propofol, time to be set as I wait to hear back from schedulers. Please see referenced admission H&P for full history.    Carlyle Dolly MD   History and Physical  [place]. EXH:[id number] DOB: [birth year] DOA: 04/04/2020  PCP: Dettinger, Fransisca Kaufmann, MD  Patient coming from: Home  Level of care: Med-Surg  I have personally briefly reviewed patient's old medical records in [place]  Chief Complaint: weakness    HPI: Keith Olson. is a 80 y.o. 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  He had one episode of emesis yesterday and generalized abdominal pain.  He denies chest pain.  He denies fever chills and shortness of breath.  He denies dizziness.  He does report sinus pain and congestion sore throat postnasal drainage.  He does report cough. ED Course: Patient was afebrile arrival blood pressure 149/73 pulse 93 respirations 13 pulse ox 93% on 2 L of oxygen.  Sodium 134 potassium 3.8 glucose 112, high-sensitivity troponin X, lactic acid 0.9, WBC 15.6, hemoglobin 12.6.  CT of the abdomen and pelvis revealed new left lower lobe pneumonia.  CT head without contrast with findings of a right maxillary sinusitis.  Patient was admitted for community-acquired pneumonia.  SARS 2 coronavirus test negative.  Review of Systems: Review of Systems  Constitutional: Positive for fever and malaise/fatigue. Negative for chills.  HENT: Positive for congestion, ear discharge, sinus pain, sore throat and tinnitus.   Eyes: Negative.   Respiratory: Positive for cough, sputum production and shortness of breath.    Cardiovascular: Negative.   Gastrointestinal: Positive for constipation, heartburn and nausea. Negative for abdominal pain and vomiting.  Genitourinary: Negative.   Musculoskeletal: Positive for joint pain and myalgias.  Skin: Negative.   Neurological: Positive for weakness. Negative for dizziness, tingling, tremors and headaches.  Endo/Heme/Allergies: Negative.   Psychiatric/Behavioral: Negative.   All other systems reviewed and are negative.        Past Medical History:  Diagnosis Date  . Agent orange exposure   . Arthritis   . Coronary artery disease    BMS to mid LAD 2001, DES to proximal LAD 2017  . Enlarged prostate    XRT 2016  . Headache    Sinus headaches   . Heart abnormality   . History of depression   . Hyperlipidemia   . Hypothyroidism   . Prostate cancer ([place]) 07/2014   hormonal therapy, external beam radiation therapy  . PTSD (post-traumatic stress disorder)          Past Surgical History:  Procedure Laterality Date  . BRONCHIAL BRUSHINGS  07/02/2019   Procedure: BRONCHIAL BRUSHINGS;  Surgeon: Rigoberto Noel, MD;  Location: [facility];  Service: Cardiopulmonary;;  . BRONCHIAL NEEDLE ASPIRATION BIOPSY  07/02/2019   Procedure: BRONCHIAL NEEDLE ASPIRATION BIOPSIES;  Surgeon: Rigoberto Noel, MD;  Location: [facility];  Service: Cardiopulmonary;;  . BRONCHIAL WASHINGS  07/02/2019   Procedure: BRONCHIAL WASHINGS;  Surgeon: Rigoberto Noel, MD;  Location: [facility];  Service: Cardiopulmonary;;  . CARDIAC CATHETERIZATION N/A 12/12/2015   Procedure: Left Heart Cath and Coronary Angiography;  Surgeon: Peter M Martinique, MD;  Location: [facility]  INVASIVE CV LAB;  Service: Cardiovascular;  Laterality: N/A;  . CARDIAC CATHETERIZATION N/A 12/12/2015   Procedure: Coronary Stent Intervention;  Surgeon: Peter M Martinique, MD;  Location: [place] CV LAB;  Service: Cardiovascular;  Laterality: N/A;  . CORONARY STENT INTERVENTION N/A 09/06/2017   Procedure:  CORONARY STENT INTERVENTION;  Surgeon: Burnell Blanks, MD;  Location: [place] [facility] CV LAB;  Service: Cardiovascular;  Laterality: N/A;  . ENDOBRONCHIAL ULTRASOUND N/A 07/02/2019   Procedure: ENDOBRONCHIAL ULTRASOUND;  Surgeon: Rigoberto Noel, MD;  [facility];  Service: Cardiopulmonary;  Laterality: N/A;  . FLEXIBLE BRONCHOSCOPY  07/02/2019   Procedure: FLEXIBLE BRONCHOSCOPY;  Surgeon: Rigoberto Noel, MD;  [facility];  Service: Cardiopulmonary;;  . HERNIA REPAIR Right   . LEFT HEART CATH AND CORONARY ANGIOGRAPHY N/A 09/02/2016   Procedure: Left Heart Cath and Coronary Angiography;  Surgeon: Leonie Man, MD;  Location: [place] CV LAB;  Service: Cardiovascular;  Laterality: N/A;  . LEFT HEART CATH AND CORONARY ANGIOGRAPHY N/A 09/06/2017   Procedure: LEFT HEART CATH AND CORONARY ANGIOGRAPHY;  Surgeon: Burnell Blanks, MD;  Location: [place] CV LAB;  Service: Cardiovascular;  Laterality: N/A;  . LEFT HEART CATH AND CORONARY ANGIOGRAPHY N/A 05/04/2019   Procedure: LEFT HEART CATH AND CORONARY ANGIOGRAPHY;  Surgeon: Martinique, Peter M, MD;  Location: [place] CV LAB;  Service: Cardiovascular;  Laterality: N/A;  . PROSTATE BIOPSY N/A 08/28/2014   Procedure: BIOPSY TRANSRECTAL ULTRASONIC PROSTATE (TUBP);  Surgeon: Rana Snare, MD;  [facility];  Service: Urology;  Laterality: N/A;  . TRANSURETHRAL RESECTION OF PROSTATE N/A 08/28/2014   Procedure: TRANSURETHRAL RESECTION OF THE PROSTATE WITH GYRUS INSTRUMENTS;  Surgeon: Rana Snare, MD;  Location: [facility];  Service: Urology;  Laterality: N/A;     reports that he has never smoked. He has quit using smokeless tobacco.  His smokeless tobacco use included chew. He reports that he does not drink alcohol and does not use drugs.       Allergies  Allergen Reactions  . Lipitor [Atorvastatin]     Muscles aches         Family History  Problem Relation Age of Onset  . Arthritis Mother   .  Heart attack Father            Prior to Admission medications   Medication Sig Start Date End Date Taking? Authorizing Provider  tamsulosin (FLOMAX) 0.4 MG CAPS capsule Take 0.4 mg by mouth daily. 08/04/19  Yes [provider]  abiraterone acetate (ZYTIGA) 250 MG tablet TAKE 4 TABLETS (1,000 MG TOTAL) BY MOUTH DAILY. TAKE ON AN EMPTY STOMACH 1 HOUR BEFORE OR 2 HOURS AFTER A MEAL 10/18/19   Derek Jack, MD  abiraterone acetate (ZYTIGA) 250 MG tablet TAKE 4 TABLETS (1,000 MG TOTAL) BY MOUTH DAILY. TAKE ON AN EMPTY STOMACH 1 HOUR BEFORE OR 2 HOURS AFTER A MEAL 12/10/19   Derek Jack, MD  ALPRAZolam Duanne Moron) 0.5 MG tablet Take 1 tablet (0.5 mg total) by mouth 2 (two) times daily as needed for anxiety. 07/25/19   Johnson, Clanford L, MD  aspirin EC 81 MG tablet Take 1 tablet (81 mg total) by mouth daily. 12/09/15   Satira Sark, MD  cetirizine (ZYRTEC) 10 MG tablet Take 10 mg by mouth daily. 12/26/19   [provider]  dexamethasone (DECADRON) 4 MG tablet Take 1 tablet (4 mg total) by mouth 2 (two) times daily with a meal. Patient not taking: Reported on 04/04/2020 07/06/19  Rai, Ripudeep K, MD  diclofenac sodium (VOLTAREN) 1 % GEL APPLY 2 GRAMS TOPICALLY 4 TIMES DAILY Patient taking differently: Apply 2 g topically 4 (four) times daily. 12/21/18   Dettinger, Fransisca Kaufmann, MD  dronabinol (MARINOL) 2.5 MG capsule Take 1 capsule (2.5 mg total) by mouth 2 (two) times daily before a meal. 12/06/19   Derek Jack, MD  DULoxetine (CYMBALTA) 60 MG capsule Take 60 mg by mouth daily. Patient not taking: Reported on 04/04/2020 08/09/19   [provider]  fentaNYL (DURAGESIC) 25 MCG/HR Place 1 patch onto the skin every 3 (three) days. 03/11/20   Jacquelin Hawking, NP  fluticasone (FLONASE) 50 MCG/ACT nasal spray Place 2 sprays into both nostrils daily. 12/26/19   [provider]  folic acid (FOLVITE) 1 MG tablet Take 1 tablet (1 mg  total) by mouth daily. Patient not taking: Reported on 04/04/2020 07/26/19   Murlean Iba, MD  furosemide (LASIX) 20 MG tablet Take 20 mg by mouth 2 (two) times daily.  Patient not taking: Reported on 04/04/2020 09/11/19   [provider]  gabapentin (NEURONTIN) 400 MG capsule Take 1 capsule (400 mg total) by mouth 3 (three) times daily. 03/27/20   Derek Jack, MD  HYDROmorphone (DILAUDID) 4 MG tablet Take 1 tablet (4 mg total) by mouth every 8 (eight) hours as needed for severe pain. 03/27/20   Derek Jack, MD  lisinopril (PRINIVIL) 10 MG tablet Take 1 tablet (10 mg total) by mouth daily. 01/31/20   Derek Jack, MD  meclizine (ANTIVERT) 25 MG tablet Take 1 tablet (25 mg total) by mouth 2 (two) times daily as needed for dizziness. Patient not taking: Reported on 04/04/2020 07/06/19   Rai, Vernelle Emerald, MD  [facility] 4 MG/0.1ML LIQD nasal spray kit Place 1 spray into the nose once. 08/10/19   [provider]  nitroGLYCERIN (NITROSTAT) 0.4 MG SL tablet Place 1 tablet (0.4 mg total) under the tongue every 5 (five) minutes as needed for chest pain. X 3 doses 09/07/17   Bhagat, Bhavinkumar, PA  pantoprazole (PROTONIX) 40 MG tablet Take 1 tablet (40 mg total) by mouth daily. Patient not taking: Reported on 04/04/2020 07/06/19   Rai, Vernelle Emerald, MD  polyethylene glycol powder (GLYCOLAX/MIRALAX) 17 GM/SCOOP powder Take 17 g by mouth daily as needed for mild constipation or moderate constipation.     [provider]  predniSONE (DELTASONE) 5 MG tablet Take 1 tablet (5 mg total) by mouth daily with breakfast. 10/12/19   Derek Jack, MD  thiamine 100 MG tablet Take 1 tablet (100 mg total) by mouth daily. 07/26/19   Murlean Iba, MD    Physical Exam:       Vitals:   04/04/20 1325 04/04/20 1326 04/04/20 1327 04/04/20 1328  BP:   126/64   Pulse: 89 87 88 86  Resp: _0 Temp:      TempSrc:      SpO2: 92% 93%  95% 92%  Weight:      Height:        Constitutional: awake, alert, cooperative, NAD, calm, comfortable Eyes: PERRL, lids and conjunctivae normal ENMT: Mucous membranes are moist. Posterior pharynx clear of any exudate or lesions.Normal dentition.  Neck: normal, supple, no masses, no thyromegaly Respiratory: diminished BS LLL,  no wheezing, no crackles. Normal respiratory effort. No accessory muscle use.  Cardiovascular: normal s1, s2 sounds, no murmurs / rubs / gallops. No extremity edema. 2+ pedal pulses. No carotid bruits.  Abdomen:  no tenderness, no masses palpated. No hepatosplenomegaly. Bowel sounds positive.  Musculoskeletal: no clubbing / cyanosis. No joint deformity upper and lower extremities. Good ROM, no contractures. Normal muscle tone.  Skin: no rashes, lesions, ulcers. No induration Neurologic: CN 2-12 grossly intact. Sensation intact, DTR normal. Strength 5/5 in all 4.  Psychiatric: Normal judgment and insight. Alert and oriented x 3. Normal mood.   Labs on Admission: I have personally reviewed following labs and imaging studies  CBC: Last Labs      Recent Labs  Lab 04/04/20 1102  WBC 15.6*  NEUTROABS 12.2*  HGB 12.6*  HCT 39.4  MCV 98.0  PLT 215     Basic Metabolic Panel: Last Labs      Recent Labs  Lab 04/04/20 1102  NA 134*  K 3.8  CL 104  CO2 23  GLUCOSE 112*  BUN 10  CREATININE 0.69  CALCIUM 8.7*     GFR: Estimated Creatinine Clearance: 68.7 mL/min (by C-G formula based on SCr of 0.69 mg/dL). Liver Function Tests: Last Labs      Recent Labs  Lab 04/04/20 1102  AST 24  ALT 21  ALKPHOS 54  BILITOT 0.6  PROT 6.5  ALBUMIN 3.5     Last Labs   No results for input(s): LIPASE, AMYLASE in the last 168 hours.   Last Labs   No results for input(s): AMMONIA in the last 168 hours.   Coagulation Profile: Last Labs      Recent Labs  Lab 04/04/20 1102  INR 1.0     Cardiac Enzymes: Last Labs   No results for  input(s): CKTOTAL, CKMB, CKMBINDEX, TROPONINI in the last 168 hours.   BNP (last 3 results) Recent Labs (within last 365 days)  No results for input(s): PROBNP in the last 8760 hours.   HbA1C: Recent Labs (last 2 labs)   No results for input(s): HGBA1C in the last 72 hours.   CBG: Last Labs      Recent Labs  Lab 04/04/20 1000  GLUCAP 117*     Lipid Profile: Recent Labs (last 2 labs)   No results for input(s): CHOL, HDL, LDLCALC, TRIG, CHOLHDL, LDLDIRECT in the last 72 hours.   Thyroid Function Tests: Recent Labs (last 2 labs)   No results for input(s): TSH, T4TOTAL, FREET4, T3FREE, THYROIDAB in the last 72 hours.   Anemia Panel: Recent Labs (last 2 labs)   No results for input(s): VITAMINB12, FOLATE, FERRITIN, TIBC, IRON, RETICCTPCT in the last 72 hours.   Urine analysis: Labs (Brief)          Component Value Date/Time   COLORURINE YELLOW 07/22/2019 1345   APPEARANCEUR HAZY (A) 07/22/2019 1345   APPEARANCEUR Clear 05/22/2018 1547   LABSPEC 1.002 (L) 07/22/2019 1345   PHURINE 9.0 (H) 07/22/2019 1345   GLUCOSEU NEGATIVE 07/22/2019 1345   HGBUR SMALL (A) 07/22/2019 1345   BILIRUBINUR NEGATIVE 07/22/2019 1345   BILIRUBINUR Negative 05/22/2018 1547   KETONESUR NEGATIVE 07/22/2019 1345   PROTEINUR NEGATIVE 07/22/2019 1345   UROBILINOGEN negative 03/07/2014 1059   NITRITE NEGATIVE 07/22/2019 1345   LEUKOCYTESUR MODERATE (A) 07/22/2019 1345      Radiological Exams on Admission:  Imaging Results (Last 48 hours)  CT Head Wo Contrast  Result Date: 04/04/2020 CLINICAL DATA:  Eliezer Lofts lysed weakness EXAM: CT HEAD WITHOUT CONTRAST TECHNIQUE: Contiguous axial images were obtained from the base of the skull through the vertex without intravenous contrast. COMPARISON:  Jul 06, 2019 FINDINGS: Brain: No evidence of  acute infarction, hemorrhage, hydrocephalus, extra-axial collection or mass lesion/mass effect. Global parenchymal volume loss. Periventricular white  matter hypodensities consistent with sequela of chronic microvascular ischemic disease. Vascular: No hyperdense vessel or unexpected calcification. Skull: Normal. Negative for fracture or focal lesion. Sinuses/Orbits: Air-fluid levels within the sphenoid and RIGHT maxillary sinus. Mucosal thickening of multiple ethmoid air cells. Other: None. IMPRESSION: 1. No acute intracranial abnormality. 2. Air-fluid levels within the sphenoid and RIGHT maxillary sinus. This could reflect acute sinusitis in the appropriate clinical setting. Electronically Signed   By: Valentino Saxon MD   On: 04/04/2020 12:41   CT CHEST ABDOMEN PELVIS W CONTRAST  Result Date: 04/04/2020 CLINICAL DATA:  Weakness, cough, nasal congestion, abdominal pain and emesis. EXAM: CT CHEST, ABDOMEN, AND PELVIS WITH CONTRAST TECHNIQUE: Multidetector CT imaging of the chest, abdomen and pelvis was performed following the standard protocol during bolus administration of intravenous contrast. CONTRAST:  158m OMNIPAQUE IOHEXOL 300 MG/ML  SOLN COMPARISON:  PET 03/03/2020. FINDINGS: CT CHEST FINDINGS Cardiovascular: Atherosclerotic calcification of the aorta, aortic valve and coronary arteries. Heart size within normal limits. No pericardial effusion. Mediastinum/Nodes: No pathologically enlarged mediastinal, hilar or axillary lymph nodes. Esophagus is unremarkable. Lungs/Pleura: Image quality is degraded by respiratory motion and expiratory phase imaging. Calcified granuloma in the right upper lobe. Paraspinal scarring/atelectasis in the medial right lower lobe. Probable nodular scarring in the apex of the left upper lobe (4/13). New airspace consolidation and ground-glass in the superior segment left lower lobe. Minimal peribronchovascular ground-glass in the lingula. No pleural fluid. Airway is unremarkable. Musculoskeletal: Sclerotic lesions are seen throughout the visualized osseous structures. CT ABDOMEN PELVIS FINDINGS Hepatobiliary: Subcentimeter  low-attenuation lesion in the left hepatic lobe is too small to characterize but unchanged. Liver and gallbladder are unremarkable. No biliary ductal dilatation. Pancreas: Negative. Spleen: Negative. Adrenals/Urinary Tract: Adrenal glands and right kidney are unremarkable. Subcentimeter low-attenuation lesion in the left kidney is too small to characterize but statistically, a cyst is likely. Ureters are decompressed. TURP defect in the bladder. Bladder is otherwise grossly unremarkable. Stomach/Bowel: Small hiatal hernia. Stomach, small bowel, appendix in colon are otherwise unremarkable. Vascular/Lymphatic: Atherosclerotic calcification of the aorta. No pathologically enlarged lymph nodes. Reproductive: Prostatectomy. Other: No free fluid. Mesenteries and peritoneum are unremarkable. Tiny umbilical hernia contains fat. Musculoskeletal: Sclerotic lesions are again seen throughout the visualized osseous structures. IMPRESSION: 1. New left lower lobe pneumonia. 2. Diffuse osseous metastatic disease. 3. Aortic atherosclerosis (ICD10-I70.0). Coronary artery calcification. Electronically Signed   By: MLorin PicketM.D.   On: 04/04/2020 12:45   DG Chest Port 1 View  Result Date: 04/04/2020 CLINICAL DATA:  Questionable sepsis EXAM: PORTABLE CHEST 1 VIEW COMPARISON:  07/20/2019, CT chest, 06/27/2019 FINDINGS: Low volume AP portable examination with mild diffuse interstitial pulmonary opacity. Redemonstrated pulmonary nodule of the left upper lobe. The heart and mediastinum are unremarkable. Osseous structures are unremarkable. IMPRESSION: 1. Low volume AP portable examination with mild diffuse interstitial pulmonary opacity, likely edema. This appearance may be exaggerated by low volume technique. 2. Redemonstrated pulmonary nodule of the left upper lobe, better assessed by prior CT. Electronically Signed   By: AEddie CandleM.D.   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 Community acquired pneumonia - Pt immunocompromised and high risk for deterioration.  Admit for IV antibiotics, supportive measures.  2. Acute bacterial sinusitis - continue IV ceftriaxone and azithromycin.  3. Chronic pain - resume home treatment plan with duragesic patch 25 mcg and dilaudid every 8 hours PRN.  4. Constipation - opioid induced - added laxatives.  5. DNR - continue DNR order in hospital.  6. Leukocytosis - secondary to infection above, follow CBC.  7. Metastatic prostate cancer on chemotherapy - castration sensitive, followed by Dr. Delton Coombes.  8. Hypothyroidism - waiting on home meds to be reconciled.   DVT prophylaxis: enoxaparin  Code Status: DNR   Family Communication:   Disposition Plan: Home   Consults called: n/a   Admission status: INP  Level of care: Med-Surg Irwin Brakeman MD [facility]

## 2020-04-08 NOTE — CV Procedure (Signed)
CV Procedure Note  Procedure; Transesophageal echocardiogram Physician: Dr Carlyle Dolly MD Indication: Bacteremia  Patient brought to the procedure suite after appropriate consent was obtained. Sedation was achieved with the asssistance of anesthesiology, for details please refer to there documentation. He was placed in the left laterl decubitus position, bite block placed. TEE probe intubated into esophagus without difficulty and several views taken. Cardiopulmonary monitoring performed throughout the study, the patient tolerated well without complication.   No evidence of endocarditis, full report to follow   Carlyle Dolly MD

## 2020-04-08 NOTE — Transfer of Care (Signed)
Immediate Anesthesia Transfer of Care Note  Patient: Keith Olson.  Procedure(s) Performed: TRANSESOPHAGEAL ECHOCARDIOGRAM (TEE) WITH PROPOFOL (N/A )  Patient Location: PACU  Anesthesia Type:General  Level of Consciousness: awake and alert   Airway & Oxygen Therapy: Patient Spontanous Breathing  Post-op Assessment: Report given to RN and Post -op Vital signs reviewed and stable  Post vital signs: Reviewed and stable  Last Vitals:  Vitals Value Taken Time  BP 112/49   Temp 97.7   Pulse 57 04/08/20 1047  Resp 13 04/08/20 1047  SpO2 96 % 04/08/20 1047  Vitals shown include unvalidated device data.  Last Pain:  Vitals:   04/08/20 0922  TempSrc: Oral  PainSc: 0-No pain      Patients Stated Pain Goal: 8 (25/49/82 6415)  Complications: No complications documented.

## 2020-04-08 NOTE — Progress Notes (Signed)
Peripherally Inserted Central Catheter Placement  The IV Nurse has discussed with the patient and/or persons authorized to consent for the patient, the purpose of this procedure and the potential benefits and risks involved with this procedure.  The benefits include less needle sticks, lab draws from the catheter, and the patient may be discharged home with the catheter. Risks include, but not limited to, infection, bleeding, blood clot (thrombus formation), and puncture of an artery; nerve damage and irregular heartbeat and possibility to perform a PICC exchange if needed/ordered by physician.  Alternatives to this procedure were also discussed.  Bard Power PICC patient education guide, fact sheet on infection prevention and patient information card has been provided to patient /or left at bedside.    PICC Placement Documentation  PICC Single Lumen 53/91/22 Right Basilic 39 cm 0 cm (Active)  Indication for Insertion or Continuance of Line Home intravenous therapies (PICC only) 04/08/20 1704  Exposed Catheter (cm) 0 cm 04/08/20 1704  Site Assessment Clean;Dry;Intact 04/08/20 1704  Line Status Flushed;Saline locked;Blood return noted 04/08/20 1704  Dressing Type Transparent;Securing device 04/08/20 1704  Dressing Status Clean;Dry;Intact 04/08/20 1704  Antimicrobial disc in place? Yes 04/08/20 1704  Dressing Intervention New dressing 04/08/20 5834  Dressing Change Due 04/15/20 04/08/20 [place], Caylor Cerino 04/08/2020, 5:05 PM

## 2020-04-08 NOTE — Anesthesia Preprocedure Evaluation (Signed)
Anesthesia Evaluation  Patient identified by MRN, date of birth, ID band Patient awake    Reviewed: Allergy & Precautions, H&P , NPO status , Patient's Chart, lab work & pertinent test results, reviewed documented beta blocker date and time   Airway Mallampati: II  TM Distance: >3 FB Neck ROM: full    Dental no notable dental hx. (+) Edentulous Lower, Edentulous Upper   Pulmonary pneumonia, resolved,    Pulmonary exam normal breath sounds clear to auscultation       Cardiovascular Exercise Tolerance: Good + angina + CAD   Rhythm:regular Rate:Normal     Neuro/Psych  Headaches, PSYCHIATRIC DISORDERS Anxiety Depression  Neuromuscular disease    GI/Hepatic negative GI ROS, Neg liver ROS,   Endo/Other  Hypothyroidism   Renal/GU negative Renal ROS  negative genitourinary   Musculoskeletal   Abdominal   Peds  Hematology negative hematology ROS (+)   Anesthesia Other Findings   Reproductive/Obstetrics negative OB ROS                             Anesthesia Physical Anesthesia Plan  ASA: III  Anesthesia Plan: General   Post-op Pain Management:    Induction:   PONV Risk Score and Plan: Propofol infusion  Airway Management Planned:   Additional Equipment:   Intra-op Plan:   Post-operative Plan:   Informed Consent: I have reviewed the patients History and Physical, chart, labs and discussed the procedure including the risks, benefits and alternatives for the proposed anesthesia with the patient or authorized representative who has indicated his/her understanding and acceptance.     Dental Advisory Given  Plan Discussed with: CRNA  Anesthesia Plan Comments:         Anesthesia Quick Evaluation

## 2020-04-08 NOTE — Progress Notes (Signed)
MSSA bacteremia and UCx TEE done today does not show endocarditis.  Will aim for 2 weeks of ancef.  Day 4 anbx Repeat BCx are ngtd  Allergies  Allergen Reactions  . Lipitor [Atorvastatin]     Muscles aches    OPAT Orders Discharge antibiotics to be given via PICC line Discharge antibiotics: ancef 2g ivpb q8h  Duration: 14 days End Date: 04-18-20  [facility] Care Per Protocol: please  Home health RN for IV administration and teaching; PICC line care and labs.    Labs weekly while on IV antibiotics: _x_ CBC with differential __ BMP _x_ CMP __ CRP __ ESR __ Vancomycin trough __ CK  _x_ Please pull PIC at completion of IV antibiotics __ Please leave PIC in place until doctor has seen patient or been notified  Fax weekly labs to [phone]  Clinic Follow Up Appt: PCP Please repeat BCx 1 week after completing anbx

## 2020-04-08 NOTE — Anesthesia Postprocedure Evaluation (Signed)
Anesthesia Post Note  Patient: Keith Olson.  Procedure(s) Performed: TRANSESOPHAGEAL ECHOCARDIOGRAM (TEE) WITH PROPOFOL (N/A )  Patient location during evaluation: PACU Anesthesia Type: General Level of consciousness: awake and oriented Pain management: satisfactory to patient Vital Signs Assessment: post-procedure vital signs reviewed and stable Respiratory status: spontaneous breathing and respiratory function stable Cardiovascular status: blood pressure returned to baseline and stable Postop Assessment: no apparent nausea or vomiting Anesthetic complications: no   No complications documented.   Last Vitals:  Vitals:   04/08/20 0922 04/08/20 0944  BP: (!) 159/73 (!) 147/78  Pulse: 65   Resp: 12   Temp: 36.8 C   SpO2: 98%     Last Pain:  Vitals:   04/08/20 0922  TempSrc: Oral  PainSc: 0-No pain                 Karna Dupes

## 2020-04-08 NOTE — Progress Notes (Signed)
PHARMACY CONSULT NOTE FOR:  OUTPATIENT  PARENTERAL ANTIBIOTIC THERAPY (OPAT)  Indication: MSSA Bacteremia Regimen: Cefazolin 2gm IV q8h  End date: 04/18/20  IV antibiotic discharge orders are pended. To discharging provider:  please sign these orders via discharge navigator,  Select New Orders & click on the button choice - Manage This Unsigned Work.     Thank you for allowing pharmacy to be a part of this patient's care.  Isac Sarna, BS Vena [place], BCPS Clinical Pharmacist Pager [phone] 04/08/2020, 2:31 PM

## 2020-04-09 ENCOUNTER — Encounter (HOSPITAL_COMMUNITY): Payer: Self-pay | Admitting: Cardiology

## 2020-04-09 ENCOUNTER — Telehealth: Payer: Self-pay | Admitting: *Deleted

## 2020-04-09 DIAGNOSIS — C801 Malignant (primary) neoplasm, unspecified: Secondary | ICD-10-CM

## 2020-04-09 DIAGNOSIS — J019 Acute sinusitis, unspecified: Secondary | ICD-10-CM | POA: Diagnosis not present

## 2020-04-09 DIAGNOSIS — B9561 Methicillin susceptible Staphylococcus aureus infection as the cause of diseases classified elsewhere: Secondary | ICD-10-CM | POA: Diagnosis not present

## 2020-04-09 DIAGNOSIS — C7951 Secondary malignant neoplasm of bone: Secondary | ICD-10-CM | POA: Diagnosis not present

## 2020-04-09 DIAGNOSIS — Z66 Do not resuscitate: Secondary | ICD-10-CM | POA: Diagnosis not present

## 2020-04-09 DIAGNOSIS — N401 Enlarged prostate with lower urinary tract symptoms: Secondary | ICD-10-CM | POA: Diagnosis not present

## 2020-04-09 DIAGNOSIS — R7881 Bacteremia: Secondary | ICD-10-CM | POA: Diagnosis not present

## 2020-04-09 MED ORDER — IPRATROPIUM-ALBUTEROL 0.5-2.5 (3) MG/3ML IN SOLN: 3.0000 mL | Freq: Two times a day (BID) | RESPIRATORY_TRACT | Status: DC

## 2020-04-09 MED ORDER — CEFAZOLIN IV (FOR PTA / DISCHARGE USE ONLY): 2.0000 g | Freq: Three times a day (TID) | INTRAVENOUS | 0 refills | Status: AC

## 2020-04-09 NOTE — Discharge Summary (Signed)
Physician Discharge Summary  Loui Toniann Fail. ZOX:[id number] DOB: 07/20/1940 DOA: 04/04/2020  PCP: Dettinger, Fransisca Kaufmann, MD  Admit date: 04/04/2020 Discharge date: 04/09/2020  Admitted From: Home Disposition:  Home   Recommendations for Outpatient Follow-up:  1. Follow up with PCP in 1-2 weeks 2. Please obtain BMP/CBC in one week   Home Health: YES Equipment/Devices: PICC, HHRN  Discharge Condition: Stable CODE STATUS: DNR Diet recommendation: Heart Healthy   Brief/Interim Summary: 80 y.o.malewith medical history significantfor metastatic prostate cancer on chemotherapy, chronic pain, bony metastases, hyperlipidemia presented to [facility] 2 days of generalized malaise and weakness. He had one episode of emesis yesterday and generalized abdominal pain. He denies chest pain. He denies fever chills and shortness of breath. He denies dizziness. He does report sinus pain and congestion sore throat postnasal drainage. He does report cough. ED Course:Patient was afebrile arrival blood pressure 149/73 pulse 93 respirations 13 pulse ox 93% on 2 L of oxygen. Sodium 134 potassium 3.8 glucose 112, high-sensitivity troponin X, lactic acid 0.9, WBC 15.6, hemoglobin 12.6. CT of the abdomen and pelvis revealed new left lower lobe pneumonia. CT head without contrastwith findings of a right maxillary sinusitis. Patient was admitted for community-acquired pneumonia. SARS 2 coronavirus test negative. Discharge Diagnoses:   Lobar pneumonia -04/04/2020 CT CAP--consolidation and GGO in LLL -Continue IV cefazolin -Stable on room air  MSSA bacteremia -04/04/2020 blood cultures MSSA -04/05/2020 surveillance blood cultures--negative at the time of discharge -04/08/2020 TEE--negative for vegetation, EF 60-65% -Plan cefazolin x2 weeks -Continue cefazolin with end date of 04/18/2020  Metastatic prostate cancer -Patient follows up with Dr. Delton Coombes -Continue Zytiga  Neoplasm associated  pain -Continue home dose of fentanyl, gabapentin -Continue home dose of Dilaudid  Anxiety/depression -Continue Cymbalta and home dose alprazolam  BPH -Continue tamsulosin  Coronary artery disease -Continue aspirin -No chest pain presently   Discharge Instructions  Discharge Instructions    [facility] pharmacist to adjust dose for Vancomycin, Aminoglycosides and other anti-infective therapies as requested by physician.   Complete by: As directed    [facility] to provide Cath Flo 2mg    Complete by: As directed    Administer for PICC line occlusion and as ordered by physician for other access device issues.   Anaphylaxis Kit: Provided to treat any anaphylactic reaction to the medication being provided to the patient if First Dose or when requested by physician   Complete by: As directed    Epinephrine 1mg /ml vial / amp: Administer 0.3mg  (0.54ml) subcutaneously once for moderate to severe anaphylaxis, nurse to call physician and pharmacy when reaction occurs and call 911 if needed for immediate care   Diphenhydramine 50mg /ml IV vial: Administer 25-50mg  IV/IM PRN for first dose reaction, rash, itching, mild reaction, nurse to call physician and pharmacy when reaction occurs   Sodium Chloride 0.9% NS 530ml IV: Administer if needed for hypovolemic blood pressure drop or as ordered by physician after call to physician with anaphylactic reaction   Change dressing on IV access line weekly and PRN   Complete by: As directed    Flush IV access with Sodium Chloride 0.9% and Heparin 10 units/ml or 100 units/ml   Complete by: As directed    Home infusion instructions - [facility]   Complete by: As directed    Instructions: Flush IV access with Sodium Chloride 0.9% and Heparin 10units/ml or 100units/ml   Change dressing on IV access line: Weekly and PRN   Instructions Cath Flo 2mg : Administer for PICC Line  occlusion and as ordered by physician for other access  device   [facility] pharmacist to adjust dose for: Vancomycin, Aminoglycosides and other anti-infective therapies as requested by physician   Method of administration may be changed at the discretion of home infusion pharmacist based upon assessment of the patient and/or caregiver's ability to self-administer the medication ordered   Complete by: As directed      Allergies as of 04/09/2020      Reactions   Lipitor [atorvastatin]    Muscles aches      Medication List    TAKE these medications   abiraterone acetate 250 MG tablet Commonly known as: ZYTIGA TAKE 4 TABLETS (1,000 MG TOTAL) BY MOUTH DAILY. TAKE ON AN EMPTY STOMACH 1 HOUR BEFORE OR 2 HOURS AFTER A MEAL   ALPRAZolam 0.5 MG tablet Commonly known as: XANAX Take 1 tablet (0.5 mg total) by mouth 2 (two) times daily as needed for anxiety.   aspirin EC 81 MG tablet Take 1 tablet (81 mg total) by mouth daily.   ceFAZolin  IVPB Commonly known as: ANCEF Inject 2 g into the vein every 8 (eight) hours for 10 days. Indication:  MSSA Bacteremia First Dose: No Last Day of Therapy:  04/18/2020 Labs - Once weekly:  CBC/D and BMP, Labs - Every other week:  ESR and CRP Method of administration: IV Push Method of administration may be changed at the discretion of home infusion pharmacist based upon assessment of the patient and/or caregiver's ability to self-administer the medication ordered.   cetirizine 10 MG tablet Commonly known as: ZYRTEC Take 10 mg by mouth daily.   diclofenac sodium 1 % Gel Commonly known as: VOLTAREN APPLY 2 GRAMS TOPICALLY 4 TIMES DAILY What changed: See the new instructions.   DULoxetine 60 MG capsule Commonly known as: CYMBALTA Take 60 mg by mouth daily.   fentaNYL 25 MCG/HR Commonly known as: [place] 1 patch onto the skin every 3 (three) days.   fluticasone 50 MCG/ACT nasal spray Commonly known as: FLONASE Place 2 sprays into both nostrils daily.   gabapentin 400 MG  capsule Commonly known as: NEURONTIN Take 1 capsule (400 mg total) by mouth 3 (three) times daily.   HYDROmorphone 4 MG tablet Commonly known as: Dilaudid Take 1 tablet (4 mg total) by mouth every 8 (eight) hours as needed for severe pain.   Narcan 4 MG/0.1ML Liqd nasal spray kit Generic drug: naloxone Place 1 spray into the nose once.   nitroGLYCERIN 0.4 MG SL tablet Commonly known as: NITROSTAT Place 1 tablet (0.4 mg total) under the tongue every 5 (five) minutes as needed for chest pain. X 3 doses   pantoprazole 40 MG tablet Commonly known as: PROTONIX Take 1 tablet (40 mg total) by mouth daily.   polyethylene glycol powder 17 GM/SCOOP powder Commonly known as: GLYCOLAX/MIRALAX Take 17 g by mouth daily as needed for mild constipation or moderate constipation.   predniSONE 5 MG tablet Commonly known as: DELTASONE Take 1 tablet (5 mg total) by mouth daily with breakfast.   tamsulosin 0.4 MG Caps capsule Commonly known as: FLOMAX Take 0.4 mg by mouth daily.            Discharge Care Instructions  (From admission, onward)         Start     Ordered   04/09/20 0000  Change dressing on IV access line weekly and PRN  (Home infusion instructions - [facility] )  04/09/20 1121          Follow-up Information    [facility].   Specialty: Home Health Services Why: Will contact you to schedule home health visits.              Allergies  Allergen Reactions  . Lipitor [Atorvastatin]     Muscles aches    Consultations:  ID   Procedures/Studies: CT Head Wo Contrast  Result Date: 04/04/2020 CLINICAL DATA:  Eliezer Lofts lysed weakness EXAM: CT HEAD WITHOUT CONTRAST TECHNIQUE: Contiguous axial images were obtained from the base of the skull through the vertex without intravenous contrast. COMPARISON:  Jul 06, 2019 FINDINGS: Brain: No evidence of acute infarction, hemorrhage, hydrocephalus, extra-axial collection or mass  lesion/mass effect. Global parenchymal volume loss. Periventricular white matter hypodensities consistent with sequela of chronic microvascular ischemic disease. Vascular: No hyperdense vessel or unexpected calcification. Skull: Normal. Negative for fracture or focal lesion. Sinuses/Orbits: Air-fluid levels within the sphenoid and RIGHT maxillary sinus. Mucosal thickening of multiple ethmoid air cells. Other: None. IMPRESSION: 1. No acute intracranial abnormality. 2. Air-fluid levels within the sphenoid and RIGHT maxillary sinus. This could reflect acute sinusitis in the appropriate clinical setting. Electronically Signed   By: Valentino Saxon MD   On: 04/04/2020 12:41   CT CHEST ABDOMEN PELVIS W CONTRAST  Result Date: 04/04/2020 CLINICAL DATA:  Weakness, cough, nasal congestion, abdominal pain and emesis. EXAM: CT CHEST, ABDOMEN, AND PELVIS WITH CONTRAST TECHNIQUE: Multidetector CT imaging of the chest, abdomen and pelvis was performed following the standard protocol during bolus administration of intravenous contrast. CONTRAST:  121m OMNIPAQUE IOHEXOL 300 MG/ML  SOLN COMPARISON:  PET 03/03/2020. FINDINGS: CT CHEST FINDINGS Cardiovascular: Atherosclerotic calcification of the aorta, aortic valve and coronary arteries. Heart size within normal limits. No pericardial effusion. Mediastinum/Nodes: No pathologically enlarged mediastinal, hilar or axillary lymph nodes. Esophagus is unremarkable. Lungs/Pleura: Image quality is degraded by respiratory motion and expiratory phase imaging. Calcified granuloma in the right upper lobe. Paraspinal scarring/atelectasis in the medial right lower lobe. Probable nodular scarring in the apex of the left upper lobe (4/13). New airspace consolidation and ground-glass in the superior segment left lower lobe. Minimal peribronchovascular ground-glass in the lingula. No pleural fluid. Airway is unremarkable. Musculoskeletal: Sclerotic lesions are seen throughout the visualized  osseous structures. CT ABDOMEN PELVIS FINDINGS Hepatobiliary: Subcentimeter low-attenuation lesion in the left hepatic lobe is too small to characterize but unchanged. Liver and gallbladder are unremarkable. No biliary ductal dilatation. Pancreas: Negative. Spleen: Negative. Adrenals/Urinary Tract: Adrenal glands and right kidney are unremarkable. Subcentimeter low-attenuation lesion in the left kidney is too small to characterize but statistically, a cyst is likely. Ureters are decompressed. TURP defect in the bladder. Bladder is otherwise grossly unremarkable. Stomach/Bowel: Small hiatal hernia. Stomach, small bowel, appendix in colon are otherwise unremarkable. Vascular/Lymphatic: Atherosclerotic calcification of the aorta. No pathologically enlarged lymph nodes. Reproductive: Prostatectomy. Other: No free fluid. Mesenteries and peritoneum are unremarkable. Tiny umbilical hernia contains fat. Musculoskeletal: Sclerotic lesions are again seen throughout the visualized osseous structures. IMPRESSION: 1. New left lower lobe pneumonia. 2. Diffuse osseous metastatic disease. 3. Aortic atherosclerosis (ICD10-I70.0). Coronary artery calcification. Electronically Signed   By: MLorin PicketM.D.   On: 04/04/2020 12:45   DG Chest Port 1 View  Result Date: 04/04/2020 CLINICAL DATA:  Questionable sepsis EXAM: PORTABLE CHEST 1 VIEW COMPARISON:  07/20/2019, CT chest, 06/27/2019 FINDINGS: Low volume AP portable examination with mild diffuse interstitial pulmonary opacity. Redemonstrated pulmonary nodule of the left upper  lobe. The heart and mediastinum are unremarkable. Osseous structures are unremarkable. IMPRESSION: 1. Low volume AP portable examination with mild diffuse interstitial pulmonary opacity, likely edema. This appearance may be exaggerated by low volume technique. 2. Redemonstrated pulmonary nodule of the left upper lobe, better assessed by prior CT. Electronically Signed   By: Eddie Candle M.D.   On:  04/04/2020 10:40   ECHOCARDIOGRAM COMPLETE  Result Date: 04/06/2020    ECHOCARDIOGRAM REPORT   Patient Name:   ANIBAL QUINBY Date of Exam: 04/06/2020 Medical Rec #:  [id number]    Height:       66.0 in Accession #:    [id number]   Weight:       160.9 lb Date of Birth:  November 22, 1940    BSA:          1.823 m Patient Age:    80 years     BP:           111/53 mmHg Patient Gender: M            HR:           72 bpm. Exam Location:  [facility] Procedure: 2D Echo Indications:    Bacteremia R78.81  History:        Patient has prior history of Echocardiogram examinations, most                 recent 09/06/2017. CAD, Signs/Symptoms:Chest Pain; Risk                 Factors:Non-Smoker and Dyslipidemia.  Sonographer:    Leavy Cella RDCS (AE) Referring Phys: [place]  1. Left ventricular ejection fraction, by estimation, is 60 to 65%. The left ventricle has normal function. The left ventricle has no regional wall motion abnormalities. There is mild left ventricular hypertrophy. Left ventricular diastolic parameters are consistent with Grade I diastolic dysfunction (impaired relaxation).  2. Right ventricular systolic function is normal. The right ventricular size is normal. Tricuspid regurgitation signal is inadequate for assessing PA pressure.  3. The mitral valve is normal in structure. No evidence of mitral valve regurgitation. No evidence of mitral stenosis.  4. The aortic valve is tricuspid. There is mild calcification of the aortic valve. Aortic valve regurgitation is mild. No aortic stenosis is present.  5. The inferior vena cava is normal in size with greater than 50% respiratory variability, suggesting right atrial pressure of 3 mmHg. Conclusion(s)/Recommendation(s): No intracardiac source of embolism detected on this transthoracic study. A transesophageal echocardiogram can be considered to exclude cardiac source of embolism if clinically indicated. FINDINGS  Left Ventricle: Left ventricular  ejection fraction, by estimation, is 60 to 65%. The left ventricle has normal function. The left ventricle has no regional wall motion abnormalities. The left ventricular internal cavity size was normal in size. There is  mild left ventricular hypertrophy. Left ventricular diastolic parameters are consistent with Grade I diastolic dysfunction (impaired relaxation). Right Ventricle: The right ventricular size is normal. No increase in right ventricular wall thickness. Right ventricular systolic function is normal. Tricuspid regurgitation signal is inadequate for assessing PA pressure. Left Atrium: Left atrial size was normal in size. Right Atrium: Right atrial size was normal in size. Pericardium: There is no evidence of pericardial effusion. Mitral Valve: The mitral valve is normal in structure. No evidence of mitral valve regurgitation. No evidence of mitral valve stenosis. Tricuspid Valve: The tricuspid valve is normal in structure. Tricuspid valve regurgitation is trivial. No evidence of tricuspid  stenosis. Aortic Valve: The aortic valve is tricuspid. There is mild calcification of the aortic valve. Aortic valve regurgitation is mild. No aortic stenosis is present. Pulmonic Valve: The pulmonic valve was grossly normal. Pulmonic valve regurgitation is not visualized. No evidence of pulmonic stenosis. Aorta: The aortic root is normal in size and structure. Venous: The inferior vena cava is normal in size with greater than 50% respiratory variability, suggesting right atrial pressure of 3 mmHg. IAS/Shunts: No atrial level shunt detected by color flow Doppler.  LEFT VENTRICLE PLAX 2D LVIDd:         4.34 cm  Diastology LVIDs:         2.00 cm  LV e' medial:    7.07 cm/s LV PW:         1.15 cm  LV E/e' medial:  11.6 LV IVS:        1.03 cm  LV e' lateral:   8.16 cm/s LVOT diam:     1.90 cm  LV E/e' lateral: 10.1 LV SV:         73 LV SV Index:   40 LVOT Area:     2.84 cm  RIGHT VENTRICLE RV S prime:     23.50 cm/s TAPSE  (M-mode): 3.2 cm LEFT ATRIUM             Index       RIGHT ATRIUM           Index LA diam:        3.30 cm 1.81 cm/m  RA Area:     14.20 cm LA Vol (A2C):   46.8 ml 25.67 ml/m RA Volume:   31.80 ml  17.44 ml/m LA Vol (A4C):   56.0 ml 30.71 ml/m LA Biplane Vol: 52.6 ml 28.85 ml/m  AORTIC VALVE LVOT Vmax:   110.12 cm/s LVOT Vmean:  79.536 cm/s LVOT VTI:    0.258 m  AORTA Ao Root diam: 3.00 cm MITRAL VALVE MV Area (PHT): 2.99 cm    SHUNTS MV Decel Time: 254 msec    Systemic VTI:  0.26 m MV E velocity: 82.30 cm/s  Systemic Diam: 1.90 cm MV A velocity: 89.10 cm/s MV E/A ratio:  0.92 Cherlynn Kaiser MD Electronically signed by Cherlynn Kaiser MD Signature Date/Time: 04/06/2020/1:20:50 PM    Final    ECHO TEE  Result Date: 04/08/2020    TRANSESOPHOGEAL ECHO REPORT   Patient Name:   Bryten Asser Lucena. Date of Exam: 04/08/2020 Medical Rec #:  [id number]        Height:       66.0 in Accession #:    [id number]       Weight:       160.9 lb Date of Birth:  Dec 18, 1940        BSA:          1.823 m Patient Age:    30 years         BP:           117/59 mmHg Patient Gender: M                HR:           57 bpm. Exam Location:  [facility] Procedure: Transesophageal Echo Indications:    Bacteremia 790.7 / R78.81  History:        Patient has prior history of Echocardiogram examinations, most                 recent 04/06/2020.  CAD; Risk Factors:Dyslipidemia and Non-Smoker.                 DNR.  Sonographer:    Leavy Cella RDCS (AE) Referring Phys: [id number] Alphonse Guild [facility]CH PROCEDURE: The transesophogeal probe was passed without difficulty through the esophogus of the patient. Local oropharyngeal anesthetic was provided with viscous lidocaine. Sedation performed by performing physician. The patient developed no complications during the procedure. IMPRESSIONS  1. Left ventricular ejection fraction, by estimation, is 60 to 65%. The left ventricle has normal function.  2. Right ventricular systolic function is normal. The right  ventricular size is normal.  3. No left atrial/left atrial appendage thrombus was detected. The LAA emptying velocity was 70 cm/s.  4. The mitral valve is normal in structure. Mild mitral valve regurgitation. No evidence of mitral stenosis.  5. The aortic valve is tricuspid. Aortic valve regurgitation is mild. No aortic stenosis is present. Conclusion(s)/Recommendation(s): No evidence of vegetation/infective endocarditis on this transesophageal echocardiogram. FINDINGS  Left Ventricle: Left ventricular ejection fraction, by estimation, is 60 to 65%. The left ventricle has normal function. The left ventricular internal cavity size was normal in size. Right Ventricle: The right ventricular size is normal. No increase in right ventricular wall thickness. Right ventricular systolic function is normal. Left Atrium: Left atrial size was normal in size. No left atrial/left atrial appendage thrombus was detected. The LAA emptying velocity was 70 cm/s. Right Atrium: Right atrial size was normal in size. Pericardium: There is no evidence of pericardial effusion. Mitral Valve: The mitral valve is normal in structure. Mild mitral valve regurgitation. No evidence of mitral valve stenosis. Tricuspid Valve: The tricuspid valve is normal in structure. Tricuspid valve regurgitation is trivial. No evidence of tricuspid stenosis. Aortic Valve: The aortic valve is tricuspid. Aortic valve regurgitation is mild. No aortic stenosis is present. Pulmonic Valve: The pulmonic valve was not well visualized. Pulmonic valve regurgitation is mild. No evidence of pulmonic stenosis. Aorta: Mild plaque in the visualized portion of the descending aorta. The aortic root is normal in size and structure. IAS/Shunts: No atrial level shunt detected by color flow Doppler.   AORTA Ao Root diam: 3.50 cm Carlyle Dolly MD Electronically signed by Carlyle Dolly MD Signature Date/Time: 04/08/2020/11:22:12 AM    Final    [place] EKG SITE RITE  Result Date:  04/08/2020 If Site Rite image not attached, placement could not be confirmed due to current cardiac rhythm.        Discharge Exam: Vitals:   04/09/20 0800 04/09/20 0928  BP: (!) 156/87   Pulse: 64   Resp: 19   Temp: 98 F (36.7 C)   SpO2: 94% 95%   Vitals:   04/08/20 2119 04/09/20 0555 04/09/20 0800 04/09/20 0928  BP: (!) 148/80 (!) 168/79 (!) 156/87   Pulse:  (!) 59 64   Resp:  18 19   Temp:  97.8 F (36.6 C) 98 F (36.7 C)   TempSrc:  Oral Oral   SpO2:  95% 94% 95%  Weight:      Height:        General: Pt is alert, awake, not in acute distress Cardiovascular: RRR, S1/S2 +, no rubs, no gallops Respiratory: diminished BS left base.  No wheeze Abdominal: Soft, NT, ND, bowel sounds + Extremities: no edema, no cyanosis   The results of significant diagnostics from this hospitalization (including imaging, microbiology, ancillary and laboratory) are listed below for reference.    Significant Diagnostic Studies: CT Head Wo Contrast  Result Date: 04/04/2020 CLINICAL DATA:  Eliezer Lofts lysed weakness EXAM: CT HEAD WITHOUT CONTRAST TECHNIQUE: Contiguous axial images were obtained from the base of the skull through the vertex without intravenous contrast. COMPARISON:  Jul 06, 2019 FINDINGS: Brain: No evidence of acute infarction, hemorrhage, hydrocephalus, extra-axial collection or mass lesion/mass effect. Global parenchymal volume loss. Periventricular white matter hypodensities consistent with sequela of chronic microvascular ischemic disease. Vascular: No hyperdense vessel or unexpected calcification. Skull: Normal. Negative for fracture or focal lesion. Sinuses/Orbits: Air-fluid levels within the sphenoid and RIGHT maxillary sinus. Mucosal thickening of multiple ethmoid air cells. Other: None. IMPRESSION: 1. No acute intracranial abnormality. 2. Air-fluid levels within the sphenoid and RIGHT maxillary sinus. This could reflect acute sinusitis in the appropriate clinical setting.  Electronically Signed   By: Valentino Saxon MD   On: 04/04/2020 12:41   CT CHEST ABDOMEN PELVIS W CONTRAST  Result Date: 04/04/2020 CLINICAL DATA:  Weakness, cough, nasal congestion, abdominal pain and emesis. EXAM: CT CHEST, ABDOMEN, AND PELVIS WITH CONTRAST TECHNIQUE: Multidetector CT imaging of the chest, abdomen and pelvis was performed following the standard protocol during bolus administration of intravenous contrast. CONTRAST:  17m OMNIPAQUE IOHEXOL 300 MG/ML  SOLN COMPARISON:  PET 03/03/2020. FINDINGS: CT CHEST FINDINGS Cardiovascular: Atherosclerotic calcification of the aorta, aortic valve and coronary arteries. Heart size within normal limits. No pericardial effusion. Mediastinum/Nodes: No pathologically enlarged mediastinal, hilar or axillary lymph nodes. Esophagus is unremarkable. Lungs/Pleura: Image quality is degraded by respiratory motion and expiratory phase imaging. Calcified granuloma in the right upper lobe. Paraspinal scarring/atelectasis in the medial right lower lobe. Probable nodular scarring in the apex of the left upper lobe (4/13). New airspace consolidation and ground-glass in the superior segment left lower lobe. Minimal peribronchovascular ground-glass in the lingula. No pleural fluid. Airway is unremarkable. Musculoskeletal: Sclerotic lesions are seen throughout the visualized osseous structures. CT ABDOMEN PELVIS FINDINGS Hepatobiliary: Subcentimeter low-attenuation lesion in the left hepatic lobe is too small to characterize but unchanged. Liver and gallbladder are unremarkable. No biliary ductal dilatation. Pancreas: Negative. Spleen: Negative. Adrenals/Urinary Tract: Adrenal glands and right kidney are unremarkable. Subcentimeter low-attenuation lesion in the left kidney is too small to characterize but statistically, a cyst is likely. Ureters are decompressed. TURP defect in the bladder. Bladder is otherwise grossly unremarkable. Stomach/Bowel: Small hiatal hernia.  Stomach, small bowel, appendix in colon are otherwise unremarkable. Vascular/Lymphatic: Atherosclerotic calcification of the aorta. No pathologically enlarged lymph nodes. Reproductive: Prostatectomy. Other: No free fluid. Mesenteries and peritoneum are unremarkable. Tiny umbilical hernia contains fat. Musculoskeletal: Sclerotic lesions are again seen throughout the visualized osseous structures. IMPRESSION: 1. New left lower lobe pneumonia. 2. Diffuse osseous metastatic disease. 3. Aortic atherosclerosis (ICD10-I70.0). Coronary artery calcification. Electronically Signed   By: MLorin PicketM.D.   On: 04/04/2020 12:45   DG Chest Port 1 View  Result Date: 04/04/2020 CLINICAL DATA:  Questionable sepsis EXAM: PORTABLE CHEST 1 VIEW COMPARISON:  07/20/2019, CT chest, 06/27/2019 FINDINGS: Low volume AP portable examination with mild diffuse interstitial pulmonary opacity. Redemonstrated pulmonary nodule of the left upper lobe. The heart and mediastinum are unremarkable. Osseous structures are unremarkable. IMPRESSION: 1. Low volume AP portable examination with mild diffuse interstitial pulmonary opacity, likely edema. This appearance may be exaggerated by low volume technique. 2. Redemonstrated pulmonary nodule of the left upper lobe, better assessed by prior CT. Electronically Signed   By: AEddie CandleM.D.   On: 04/04/2020 10:40   ECHOCARDIOGRAM COMPLETE  Result Date: 04/06/2020    ECHOCARDIOGRAM REPORT  Patient Name:   RAMEZ ARRONA Date of Exam: 04/06/2020 Medical Rec #:  [id number]    Height:       66.0 in Accession #:    [id number]   Weight:       160.9 lb Date of Birth:  Jul 14, 1940    BSA:          1.823 m Patient Age:    80 years     BP:           111/53 mmHg Patient Gender: M            HR:           72 bpm. [facility] Procedure: 2D Echo Indications:    Bacteremia R78.81  History:        Patient has prior history of Echocardiogram examinations, most                 recent 09/06/2017. CAD,  Signs/Symptoms:Chest Pain; Risk                 Factors:Non-Smoker and Dyslipidemia.  Sonographer:    Jeryl Columbia RDCS (AE) Referring Phys: [id number] CLANFORD L JOHNSON IMPRESSIONS  1. Left ventricular ejection fraction, by estimation, is 60 to 65%. The left ventricle has normal function. The left ventricle has no regional wall motion abnormalities. There is mild left ventricular hypertrophy. Left ventricular diastolic parameters are consistent with Grade I diastolic dysfunction (impaired relaxation).  2. Right ventricular systolic function is normal. The right ventricular size is normal. Tricuspid regurgitation signal is inadequate for assessing PA pressure.  3. The mitral valve is normal in structure. No evidence of mitral valve regurgitation. No evidence of mitral stenosis.  4. The aortic valve is tricuspid. There is mild calcification of the aortic valve. Aortic valve regurgitation is mild. No aortic stenosis is present.  5. The inferior vena cava is normal in size with greater than 50% respiratory variability, suggesting right atrial pressure of 3 mmHg. Conclusion(s)/Recommendation(s): No intracardiac source of embolism detected on this transthoracic study. A transesophageal echocardiogram can be considered to exclude cardiac source of embolism if clinically indicated. FINDINGS  Left Ventricle: Left ventricular ejection fraction, by estimation, is 60 to 65%. The left ventricle has normal function. The left ventricle has no regional wall motion abnormalities. The left ventricular internal cavity size was normal in size. There is  mild left ventricular hypertrophy. Left ventricular diastolic parameters are consistent with Grade I diastolic dysfunction (impaired relaxation). Right Ventricle: The right ventricular size is normal. No increase in right ventricular wall thickness. Right ventricular systolic function is normal. Tricuspid regurgitation signal is inadequate for assessing PA pressure. Left Atrium: Left  atrial size was normal in size. Right Atrium: Right atrial size was normal in size. Pericardium: There is no evidence of pericardial effusion. Mitral Valve: The mitral valve is normal in structure. No evidence of mitral valve regurgitation. No evidence of mitral valve stenosis. Tricuspid Valve: The tricuspid valve is normal in structure. Tricuspid valve regurgitation is trivial. No evidence of tricuspid stenosis. Aortic Valve: The aortic valve is tricuspid. There is mild calcification of the aortic valve. Aortic valve regurgitation is mild. No aortic stenosis is present. Pulmonic Valve: The pulmonic valve was grossly normal. Pulmonic valve regurgitation is not visualized. No evidence of pulmonic stenosis. Aorta: The aortic root is normal in size and structure. Venous: The inferior vena cava is normal in size with greater than 50% respiratory variability, suggesting right atrial pressure of 3 mmHg. IAS/Shunts:  No atrial level shunt detected by color flow Doppler.  LEFT VENTRICLE PLAX 2D LVIDd:         4.34 cm  Diastology LVIDs:         2.00 cm  LV e' medial:    7.07 cm/s LV PW:         1.15 cm  LV E/e' medial:  11.6 LV IVS:        1.03 cm  LV e' lateral:   8.16 cm/s LVOT diam:     1.90 cm  LV E/e' lateral: 10.1 LV SV:         73 LV SV Index:   40 LVOT Area:     2.84 cm  RIGHT VENTRICLE RV S prime:     23.50 cm/s TAPSE (M-mode): 3.2 cm LEFT ATRIUM             Index       RIGHT ATRIUM           Index LA diam:        3.30 cm 1.81 cm/m  RA Area:     14.20 cm LA Vol (A2C):   46.8 ml 25.67 ml/m RA Volume:   31.80 ml  17.44 ml/m LA Vol (A4C):   56.0 ml 30.71 ml/m LA Biplane Vol: 52.6 ml 28.85 ml/m  AORTIC VALVE LVOT Vmax:   110.12 cm/s LVOT Vmean:  79.536 cm/s LVOT VTI:    0.258 m  AORTA Ao Root diam: 3.00 cm MITRAL VALVE MV Area (PHT): 2.99 cm    SHUNTS MV Decel Time: 254 msec    Systemic VTI:  0.26 m MV E velocity: 82.30 cm/s  Systemic Diam: 1.90 cm MV A velocity: 89.10 cm/s MV E/A ratio:  0.92 Cherlynn Kaiser MD  Electronically signed by Cherlynn Kaiser MD Signature Date/Time: 04/06/2020/1:20:50 PM    Final    ECHO TEE  Result Date: 04/08/2020    TRANSESOPHOGEAL ECHO REPORT   Patient Name:   Gedalya Deangleo Passage. Date of Exam: 04/08/2020 Medical Rec #:  [id number]        Height:       66.0 in Accession #:    [id number]       Weight:       160.9 lb Date of Birth:  12/10/1940        BSA:          1.823 m Patient Age:    50 years         BP:           117/59 mmHg Patient Gender: M                HR:           57 bpm. [facility] Procedure: Transesophageal Echo Indications:    Bacteremia 790.7 / R78.81  History:        Patient has prior history of Echocardiogram examinations, most                 recent 04/06/2020. CAD; Risk Factors:Dyslipidemia and Non-Smoker.                 DNR.  Sonographer:    Leavy Cella RDCS (AE) Referring Phys: [id number] Alphonse Guild BRANCH PROCEDURE: The transesophogeal probe was passed without difficulty through the esophogus of the patient. Local oropharyngeal anesthetic was provided with viscous lidocaine. Sedation performed by performing physician. The patient developed no complications during the procedure. IMPRESSIONS  1. Left ventricular ejection  fraction, by estimation, is 60 to 65%. The left ventricle has normal function.  2. Right ventricular systolic function is normal. The right ventricular size is normal.  3. No left atrial/left atrial appendage thrombus was detected. The LAA emptying velocity was 70 cm/s.  4. The mitral valve is normal in structure. Mild mitral valve regurgitation. No evidence of mitral stenosis.  5. The aortic valve is tricuspid. Aortic valve regurgitation is mild. No aortic stenosis is present. Conclusion(s)/Recommendation(s): No evidence of vegetation/infective endocarditis on this transesophageal echocardiogram. FINDINGS  Left Ventricle: Left ventricular ejection fraction, by estimation, is 60 to 65%. The left ventricle has normal function. The left ventricular  internal cavity size was normal in size. Right Ventricle: The right ventricular size is normal. No increase in right ventricular wall thickness. Right ventricular systolic function is normal. Left Atrium: Left atrial size was normal in size. No left atrial/left atrial appendage thrombus was detected. The LAA emptying velocity was 70 cm/s. Right Atrium: Right atrial size was normal in size. Pericardium: There is no evidence of pericardial effusion. Mitral Valve: The mitral valve is normal in structure. Mild mitral valve regurgitation. No evidence of mitral valve stenosis. Tricuspid Valve: The tricuspid valve is normal in structure. Tricuspid valve regurgitation is trivial. No evidence of tricuspid stenosis. Aortic Valve: The aortic valve is tricuspid. Aortic valve regurgitation is mild. No aortic stenosis is present. Pulmonic Valve: The pulmonic valve was not well visualized. Pulmonic valve regurgitation is mild. No evidence of pulmonic stenosis. Aorta: Mild plaque in the visualized portion of the descending aorta. The aortic root is normal in size and structure. IAS/Shunts: No atrial level shunt detected by color flow Doppler.   AORTA Ao Root diam: 3.50 cm Carlyle Dolly MD Electronically signed by Carlyle Dolly MD Signature Date/Time: 04/08/2020/11:22:12 AM    Final    [place] EKG SITE RITE  Result Date: 04/08/2020 If Site Rite image not attached, placement could not be confirmed due to current cardiac rhythm.    Microbiology: Recent Results (from the past 240 hour(s))  SARS Coronavirus 2 by RT PCR (hospital order, performed in [facility] hospital lab) Nasopharyngeal Nasopharyngeal Swab     Status: None   Collection Time: 04/04/20  9:42 AM   Specimen: Nasopharyngeal Swab  Result Value Ref Range Status   SARS Coronavirus 2 NEGATIVE NEGATIVE Final    Comment: (NOTE) SARS-CoV-2 target nucleic acids are NOT DETECTED.  The SARS-CoV-2 RNA is generally detectable in upper and lower respiratory specimens  during the acute phase of infection. The lowest concentration of SARS-CoV-2 viral copies this assay can detect is 250 copies / mL. A negative result does not preclude SARS-CoV-2 infection and should not be used as the sole basis for treatment or other patient management decisions.  A negative result may occur with improper specimen collection / handling, submission of specimen other than nasopharyngeal swab, presence of viral mutation(s) within the areas targeted by this assay, and inadequate number of viral copies (<250 copies / mL). A negative result must be combined with clinical observations, patient history, and epidemiological information.  Fact Sheet for Patients:   StrictlyIdeas.no  Fact Sheet for Healthcare Providers: BankingDealers.co.za  This test is not yet approved or  cleared by the [place] FDA and has been authorized for detection and/or diagnosis of SARS-CoV-2 by FDA under an Emergency Use Authorization (EUA).  This EUA will remain in effect (meaning this test can be used) for the duration of the COVID-19 declaration under Section 564(b)(1) of the Act,  21 U.S.C. section 360bbb-3(b)(1), unless the authorization is terminated or revoked sooner.  Performed at [facility], [address] [place], [place] [address]   Culture, blood (single) w Reflex to ID Panel     Status: Abnormal   Collection Time: 04/04/20  1:14 PM   Specimen: BLOOD  Result Value Ref Range Status   Specimen Description   Final    BLOOD BLOOD RIGHT HAND Performed at [facility], [address], [place], [place] [address]    Special Requests   Final    Blood Culture results may not be optimal due to an inadequate volume of blood received in culture bottles BOTTLES DRAWN AEROBIC AND ANAEROBIC Performed at [facility], [address] [place], [place] [address]    Culture  Setup Time   Final    GRAM POSITIVE COCCI AEROBIC BOTTLE ONLY Gram Stain  Report Called to,Read Back By and Verified With: YATES T @ 0938 ON [id number] BY HENDERSON L. CRITICAL RESULT CALLED TO, READ BACK BY AND VERIFIED WITH: PHARMD L POOLE [id number] AT 1431 BY CM Performed at [facility], [place] [address], [place], [place] [address]    Culture STAPHYLOCOCCUS AUREUS (A)  Final   Report Status 04/07/2020 FINAL  Final   Organism ID, Bacteria STAPHYLOCOCCUS AUREUS  Final      Susceptibility   Staphylococcus aureus - MIC*    CIPROFLOXACIN <=0.5 SENSITIVE Sensitive     ERYTHROMYCIN >=8 RESISTANT Resistant     GENTAMICIN <=0.5 SENSITIVE Sensitive     OXACILLIN <=0.25 SENSITIVE Sensitive     TETRACYCLINE <=1 SENSITIVE Sensitive     VANCOMYCIN 1 SENSITIVE Sensitive     TRIMETH/SULFA <=10 SENSITIVE Sensitive     CLINDAMYCIN >=8 RESISTANT Resistant     RIFAMPIN <=0.5 SENSITIVE Sensitive     Inducible Clindamycin NEGATIVE Sensitive     * STAPHYLOCOCCUS AUREUS  Blood Culture ID Panel (Reflexed)     Status: Abnormal   Collection Time: 04/04/20  1:14 PM  Result Value Ref Range Status   Enterococcus faecalis NOT DETECTED NOT DETECTED Final   Enterococcus Faecium NOT DETECTED NOT DETECTED Final   Listeria monocytogenes NOT DETECTED NOT DETECTED Final   Staphylococcus species DETECTED (A) NOT DETECTED Final    Comment: CRITICAL RESULT CALLED TO, READ BACK BY AND VERIFIED WITH: PHARMD L POOLE [id number] AT 1431 BY CM    Staphylococcus aureus (BCID) DETECTED (A) NOT DETECTED Final    Comment: CRITICAL RESULT CALLED TO, READ BACK BY AND VERIFIED WITH: PHARMD L POOLE [id number] AT 1431 BY CM    Staphylococcus epidermidis NOT DETECTED NOT DETECTED Final   Staphylococcus lugdunensis NOT DETECTED NOT DETECTED Final   Streptococcus species NOT DETECTED NOT DETECTED Final   Streptococcus agalactiae NOT DETECTED NOT DETECTED Final   Streptococcus pneumoniae NOT DETECTED NOT DETECTED Final   Streptococcus pyogenes NOT DETECTED NOT DETECTED Final   A.calcoaceticus-baumannii NOT DETECTED  NOT DETECTED Final   Bacteroides fragilis NOT DETECTED NOT DETECTED Final   Enterobacterales NOT DETECTED NOT DETECTED Final   Enterobacter cloacae complex NOT DETECTED NOT DETECTED Final   Escherichia coli NOT DETECTED NOT DETECTED Final   Klebsiella aerogenes NOT DETECTED NOT DETECTED Final   Klebsiella oxytoca NOT DETECTED NOT DETECTED Final   Klebsiella pneumoniae NOT DETECTED NOT DETECTED Final   Proteus species NOT DETECTED NOT DETECTED Final   Salmonella species NOT DETECTED NOT DETECTED Final   Serratia marcescens NOT DETECTED NOT DETECTED Final   Haemophilus influenzae NOT DETECTED NOT  DETECTED Final   Neisseria meningitidis NOT DETECTED NOT DETECTED Final   Pseudomonas aeruginosa NOT DETECTED NOT DETECTED Final   Stenotrophomonas maltophilia NOT DETECTED NOT DETECTED Final   Candida albicans NOT DETECTED NOT DETECTED Final   Candida auris NOT DETECTED NOT DETECTED Final   Candida glabrata NOT DETECTED NOT DETECTED Final   Candida krusei NOT DETECTED NOT DETECTED Final   Candida parapsilosis NOT DETECTED NOT DETECTED Final   Candida tropicalis NOT DETECTED NOT DETECTED Final   Cryptococcus neoformans/gattii NOT DETECTED NOT DETECTED Final   Meth resistant mecA/C and MREJ NOT DETECTED NOT DETECTED Final    Comment: Performed at [facility], [place] [address], [place], [place] [address]  MRSA PCR Screening     Status: None   Collection Time: 04/04/20  1:48 PM   Specimen: Nasal Mucosa; Nasopharyngeal  Result Value Ref Range Status   MRSA by PCR NEGATIVE NEGATIVE Final    Comment:        The GeneXpert MRSA Assay (FDA approved for NASAL specimens only), is one component of a comprehensive MRSA colonization surveillance program. It is not intended to diagnose MRSA infection nor to guide or monitor treatment for MRSA infections. Performed at [facility], [address] [place], [place] [address]   Urine culture     Status: Abnormal   Collection Time: 04/04/20  3:44 PM    Specimen: Urine, Catheterized  Result Value Ref Range Status   Specimen Description   Final    URINE, CATHETERIZED Performed at [facility], [address], [place], [place] [address]    Special Requests   Final    NONE Performed at [facility], [address] [place], [place] [address]    Culture (A)  Final    >=100,000 COLONIES/mL GROUP B STREP(S.AGALACTIAE)ISOLATED TESTING AGAINST S. AGALACTIAE NOT ROUTINELY PERFORMED DUE TO PREDICTABILITY OF AMP/PEN/VAN SUSCEPTIBILITY. Performed at [facility], [place] [address] [place], [place] [address]    Report Status 04/06/2020 FINAL  Final  Culture, blood (Routine X 2) w Reflex to ID Panel     Status: None (Preliminary result)   Collection Time: 04/05/20  3:41 PM   Specimen: BLOOD LEFT HAND  Result Value Ref Range Status   Specimen Description BLOOD LEFT HAND BOTTLES DRAWN AEROBIC ONLY  Final   Special Requests Blood Culture adequate volume  Final   Culture   Final    NO GROWTH 4 DAYS Performed at [facility], [address], [place], [place] [address]    Report Status PENDING  Incomplete  Culture, blood (Routine X 2) w Reflex to ID Panel     Status: None (Preliminary result)   Collection Time: 04/05/20  3:49 PM   Specimen: BLOOD RIGHT HAND  Result Value Ref Range Status   Specimen Description   Final    BLOOD RIGHT HAND BOTTLES DRAWN AEROBIC AND ANAEROBIC   Special Requests Blood Culture adequate volume  Final   Culture   Final    NO GROWTH 4 DAYS Performed at [facility], [address] [place], [place] [address]    Report Status PENDING  Incomplete     Labs: Basic Metabolic Panel: Recent Labs  Lab 04/04/20 1102 04/04/20 1154 04/05/20 0400  NA 134* 136 136  K 3.8 3.9 3.5  CL 104 105 106  CO2 23  --  22  GLUCOSE 112* 106* 108*  BUN 10 9 7*  CREATININE 0.69 0.60* 0.57*  CALCIUM 8.7*  --  8.2*  MG  --   --  2.2   Liver Function Tests: Recent Labs  Lab 04/04/20 1102 04/05/20 0400  AST 24 19  ALT 21 17   ALKPHOS 54 50  BILITOT 0.6 0.6  PROT 6.5 6.3*  ALBUMIN 3.5 3.2*   No results for input(s): LIPASE, AMYLASE in the last 168 hours. No results for input(s): AMMONIA in the last 168 hours. CBC: Recent Labs  Lab 04/04/20 1102 04/04/20 1154 04/05/20 0400 04/06/20 0453  WBC 15.6*  --  8.9 7.3  NEUTROABS 12.2*  --  5.9 5.1  HGB 12.6* 13.6 11.8* 10.8*  HCT 39.4 40.0 36.4* 32.7*  MCV 98.0  --  95.5 94.5  PLT 215  --  221 185   Cardiac Enzymes: No results for input(s): CKTOTAL, CKMB, CKMBINDEX, TROPONINI in the last 168 hours. BNP: Invalid input(s): POCBNP CBG: Recent Labs  Lab 04/04/20 1000  GLUCAP 117*    Time coordinating discharge:  36 minutes  Signed:  Orson Eva, DO [facility] Pager: [phone] 04/09/2020, 11:29 AM

## 2020-04-09 NOTE — TOC Transition Note (Signed)
Transition of Care [facility]) - CM/SW Discharge Note   Patient Details  Name: Keith Olson. MRN: [id number] Date of Birth: 06-22-1940  Transition of Care [facility]) CM/SW Contact:  Salome Arnt, LCSW Phone Number: 04/09/2020, 2:24 PM   Clinical Narrative:  Pt d/c today. Pam with [facility] has completed teaching with son. [facility] notified of d/c. Orders in for home health RN and PT.      Final next level of care: [place] Barriers to Discharge: Barriers Resolved   Patient Goals and CMS Choice Patient states their goals for this hospitalization and ongoing recovery are:: Return home with [facility] CMS Medicare.gov Compare Post Acute Care list provided to:: Patient Choice offered to / list presented to : Patient  Discharge Placement                       Discharge Plan and Services In-house Referral: NA Discharge Planning Services: NA Post Acute Care Choice: NA          DME Arranged: IV pump/equipment DME Agency: NA Date DME Agency Contacted: 04/08/20   Representative spoke with at DME Agency: [place]: RN,PT [place] Agency: NA Date [place]: 04/08/20      Social Determinants of Health (SDOH) Interventions     Readmission Risk Interventions Readmission Risk Prevention Plan 04/06/2020 07/23/2019  Transportation Screening Complete Complete  PCP or Specialist Appt within 3-5 Days - Not Complete  HRI or [place] Complete Complete  Social Work Consult for [place] Planning/Counseling Complete Complete  Palliative Care Screening Not Applicable Not Applicable  Medication Review Press photographer) Complete Complete  Some recent data might be hidden

## 2020-04-09 NOTE — Telephone Encounter (Signed)
Transition Care Management Unsuccessful Follow-up Telephone Call  Date of discharge and from where:  04/09/20 - [place]  Attempts:  1st Attempt  Reason for unsuccessful TCM follow-up call:  Left voice message

## 2020-04-09 NOTE — Care Management Important Message (Signed)
Important Message  Patient Details  Name: Keith Olson. MRN: [id number] Date of Birth: April 04, 1940   Medicare Important Message Given:  Yes     Tommy Medal 04/09/2020, 11:39 AM

## 2020-04-10 ENCOUNTER — Other Ambulatory Visit (HOSPITAL_COMMUNITY): Payer: Self-pay | Admitting: *Deleted

## 2020-04-10 ENCOUNTER — Telehealth: Payer: Self-pay

## 2020-04-10 DIAGNOSIS — B9561 Methicillin susceptible Staphylococcus aureus infection as the cause of diseases classified elsewhere: Secondary | ICD-10-CM | POA: Diagnosis not present

## 2020-04-10 DIAGNOSIS — G893 Neoplasm related pain (acute) (chronic): Secondary | ICD-10-CM | POA: Diagnosis not present

## 2020-04-10 DIAGNOSIS — C7951 Secondary malignant neoplasm of bone: Secondary | ICD-10-CM | POA: Diagnosis not present

## 2020-04-10 DIAGNOSIS — I2510 Atherosclerotic heart disease of native coronary artery without angina pectoris: Secondary | ICD-10-CM | POA: Diagnosis not present

## 2020-04-10 DIAGNOSIS — R7881 Bacteremia: Secondary | ICD-10-CM | POA: Diagnosis not present

## 2020-04-10 DIAGNOSIS — F32A Depression, unspecified: Secondary | ICD-10-CM | POA: Diagnosis not present

## 2020-04-10 DIAGNOSIS — E785 Hyperlipidemia, unspecified: Secondary | ICD-10-CM | POA: Diagnosis not present

## 2020-04-10 DIAGNOSIS — Z452 Encounter for adjustment and management of vascular access device: Secondary | ICD-10-CM | POA: Diagnosis not present

## 2020-04-10 DIAGNOSIS — J019 Acute sinusitis, unspecified: Secondary | ICD-10-CM | POA: Diagnosis not present

## 2020-04-10 DIAGNOSIS — E039 Hypothyroidism, unspecified: Secondary | ICD-10-CM | POA: Diagnosis not present

## 2020-04-10 LAB — CULTURE, BLOOD (ROUTINE X 2)
Culture: NO GROWTH
Culture: NO GROWTH
Special Requests: ADEQUATE
Special Requests: ADEQUATE

## 2020-04-10 NOTE — Telephone Encounter (Signed)
LMTCB on son's cell#

## 2020-04-11 ENCOUNTER — Encounter (HOSPITAL_COMMUNITY): Payer: Self-pay | Admitting: *Deleted

## 2020-04-11 ENCOUNTER — Other Ambulatory Visit (HOSPITAL_COMMUNITY): Payer: Self-pay | Admitting: Hematology

## 2020-04-11 ENCOUNTER — Other Ambulatory Visit (HOSPITAL_COMMUNITY): Payer: Self-pay

## 2020-04-11 DIAGNOSIS — C7951 Secondary malignant neoplasm of bone: Secondary | ICD-10-CM | POA: Diagnosis not present

## 2020-04-11 DIAGNOSIS — C61 Malignant neoplasm of prostate: Secondary | ICD-10-CM

## 2020-04-11 DIAGNOSIS — B9561 Methicillin susceptible Staphylococcus aureus infection as the cause of diseases classified elsewhere: Secondary | ICD-10-CM | POA: Diagnosis not present

## 2020-04-11 DIAGNOSIS — R7881 Bacteremia: Secondary | ICD-10-CM | POA: Diagnosis not present

## 2020-04-11 DIAGNOSIS — J019 Acute sinusitis, unspecified: Secondary | ICD-10-CM | POA: Diagnosis not present

## 2020-04-11 MED ORDER — FENTANYL 25 MCG/HR TD PT72: 1.0000 | MEDICATED_PATCH | TRANSDERMAL | 0 refills | Status: DC

## 2020-04-11 NOTE — Telephone Encounter (Signed)
Encounter closed after second attempt, patient did not return call

## 2020-04-11 NOTE — Progress Notes (Signed)
I received a call from Rory Percy, patient's son that lives out of town. He reports that patient was discharged from the hospital with only his chemo pills and not his pain pills.  I advised him of our policy of documenting patient's belongings when they come to the hospital.  I advised that when he picked up the medications yesterday, he signed for them and agreed that they were correct.  He tells me that he fears his brother has stolen his dad's medications and they need early refill on the dilaudid.  I advised Mr. Ilic that patient's dilaudid is a controlled substance and if it was stolen, we need a police report in order to fill early.  He verbalizes understanding but states that [employer], patient, will not call in a report on the missing medications because he fears for the discipline of his son.  He doesn't want to get him in trouble with the police.  I advised that until we get that in hand, there is nothing that we can do.    I spoke with Dr. Delton Coombes, he states that patient is due for a refill of his fentanyl patches and he will refill those at this time.  Dr. Delton Coombes will not fill the dilaudid until its due date or the police report is in hand.

## 2020-04-12 DIAGNOSIS — R7881 Bacteremia: Secondary | ICD-10-CM | POA: Diagnosis not present

## 2020-04-12 DIAGNOSIS — B9561 Methicillin susceptible Staphylococcus aureus infection as the cause of diseases classified elsewhere: Secondary | ICD-10-CM | POA: Diagnosis not present

## 2020-04-12 DIAGNOSIS — C7951 Secondary malignant neoplasm of bone: Secondary | ICD-10-CM | POA: Diagnosis not present

## 2020-04-12 DIAGNOSIS — J019 Acute sinusitis, unspecified: Secondary | ICD-10-CM | POA: Diagnosis not present

## 2020-04-13 DIAGNOSIS — J019 Acute sinusitis, unspecified: Secondary | ICD-10-CM | POA: Diagnosis not present

## 2020-04-13 DIAGNOSIS — R7881 Bacteremia: Secondary | ICD-10-CM | POA: Diagnosis not present

## 2020-04-13 DIAGNOSIS — B9561 Methicillin susceptible Staphylococcus aureus infection as the cause of diseases classified elsewhere: Secondary | ICD-10-CM | POA: Diagnosis not present

## 2020-04-13 DIAGNOSIS — C7951 Secondary malignant neoplasm of bone: Secondary | ICD-10-CM | POA: Diagnosis not present

## 2020-04-14 DIAGNOSIS — Z452 Encounter for adjustment and management of vascular access device: Secondary | ICD-10-CM | POA: Diagnosis not present

## 2020-04-14 DIAGNOSIS — I2510 Atherosclerotic heart disease of native coronary artery without angina pectoris: Secondary | ICD-10-CM | POA: Diagnosis not present

## 2020-04-14 DIAGNOSIS — F32A Depression, unspecified: Secondary | ICD-10-CM | POA: Diagnosis not present

## 2020-04-14 DIAGNOSIS — J019 Acute sinusitis, unspecified: Secondary | ICD-10-CM | POA: Diagnosis not present

## 2020-04-14 DIAGNOSIS — E039 Hypothyroidism, unspecified: Secondary | ICD-10-CM | POA: Diagnosis not present

## 2020-04-14 DIAGNOSIS — E785 Hyperlipidemia, unspecified: Secondary | ICD-10-CM | POA: Diagnosis not present

## 2020-04-14 DIAGNOSIS — R7881 Bacteremia: Secondary | ICD-10-CM | POA: Diagnosis not present

## 2020-04-14 DIAGNOSIS — B9561 Methicillin susceptible Staphylococcus aureus infection as the cause of diseases classified elsewhere: Secondary | ICD-10-CM | POA: Diagnosis not present

## 2020-04-14 DIAGNOSIS — C7951 Secondary malignant neoplasm of bone: Secondary | ICD-10-CM | POA: Diagnosis not present

## 2020-04-14 DIAGNOSIS — G893 Neoplasm related pain (acute) (chronic): Secondary | ICD-10-CM | POA: Diagnosis not present

## 2020-04-15 DIAGNOSIS — C7951 Secondary malignant neoplasm of bone: Secondary | ICD-10-CM | POA: Diagnosis not present

## 2020-04-15 DIAGNOSIS — J019 Acute sinusitis, unspecified: Secondary | ICD-10-CM | POA: Diagnosis not present

## 2020-04-15 DIAGNOSIS — R7881 Bacteremia: Secondary | ICD-10-CM | POA: Diagnosis not present

## 2020-04-15 DIAGNOSIS — B9561 Methicillin susceptible Staphylococcus aureus infection as the cause of diseases classified elsewhere: Secondary | ICD-10-CM | POA: Diagnosis not present

## 2020-04-15 NOTE — Telephone Encounter (Signed)
LMTCB and closed encounter, we have attemped 4 times

## 2020-04-16 DIAGNOSIS — J019 Acute sinusitis, unspecified: Secondary | ICD-10-CM | POA: Diagnosis not present

## 2020-04-16 DIAGNOSIS — R7881 Bacteremia: Secondary | ICD-10-CM | POA: Diagnosis not present

## 2020-04-16 DIAGNOSIS — B9561 Methicillin susceptible Staphylococcus aureus infection as the cause of diseases classified elsewhere: Secondary | ICD-10-CM | POA: Diagnosis not present

## 2020-04-16 DIAGNOSIS — C7951 Secondary malignant neoplasm of bone: Secondary | ICD-10-CM | POA: Diagnosis not present

## 2020-04-17 DIAGNOSIS — R7881 Bacteremia: Secondary | ICD-10-CM | POA: Diagnosis not present

## 2020-04-17 DIAGNOSIS — C7951 Secondary malignant neoplasm of bone: Secondary | ICD-10-CM | POA: Diagnosis not present

## 2020-04-17 DIAGNOSIS — B9561 Methicillin susceptible Staphylococcus aureus infection as the cause of diseases classified elsewhere: Secondary | ICD-10-CM | POA: Diagnosis not present

## 2020-04-17 DIAGNOSIS — J019 Acute sinusitis, unspecified: Secondary | ICD-10-CM | POA: Diagnosis not present

## 2020-04-18 DIAGNOSIS — J019 Acute sinusitis, unspecified: Secondary | ICD-10-CM | POA: Diagnosis not present

## 2020-04-18 DIAGNOSIS — R7881 Bacteremia: Secondary | ICD-10-CM | POA: Diagnosis not present

## 2020-04-18 DIAGNOSIS — C7951 Secondary malignant neoplasm of bone: Secondary | ICD-10-CM | POA: Diagnosis not present

## 2020-04-18 DIAGNOSIS — B9561 Methicillin susceptible Staphylococcus aureus infection as the cause of diseases classified elsewhere: Secondary | ICD-10-CM | POA: Diagnosis not present

## 2020-04-19 DIAGNOSIS — E039 Hypothyroidism, unspecified: Secondary | ICD-10-CM | POA: Diagnosis not present

## 2020-04-19 DIAGNOSIS — J019 Acute sinusitis, unspecified: Secondary | ICD-10-CM | POA: Diagnosis not present

## 2020-04-19 DIAGNOSIS — F32A Depression, unspecified: Secondary | ICD-10-CM | POA: Diagnosis not present

## 2020-04-19 DIAGNOSIS — R7881 Bacteremia: Secondary | ICD-10-CM | POA: Diagnosis not present

## 2020-04-19 DIAGNOSIS — I2510 Atherosclerotic heart disease of native coronary artery without angina pectoris: Secondary | ICD-10-CM | POA: Diagnosis not present

## 2020-04-19 DIAGNOSIS — C7951 Secondary malignant neoplasm of bone: Secondary | ICD-10-CM | POA: Diagnosis not present

## 2020-04-19 DIAGNOSIS — Z452 Encounter for adjustment and management of vascular access device: Secondary | ICD-10-CM | POA: Diagnosis not present

## 2020-04-19 DIAGNOSIS — E785 Hyperlipidemia, unspecified: Secondary | ICD-10-CM | POA: Diagnosis not present

## 2020-04-19 DIAGNOSIS — G893 Neoplasm related pain (acute) (chronic): Secondary | ICD-10-CM | POA: Diagnosis not present

## 2020-04-21 ENCOUNTER — Other Ambulatory Visit (HOSPITAL_COMMUNITY): Payer: Self-pay | Admitting: Hematology

## 2020-04-21 DIAGNOSIS — I2510 Atherosclerotic heart disease of native coronary artery without angina pectoris: Secondary | ICD-10-CM | POA: Diagnosis not present

## 2020-04-21 DIAGNOSIS — R7881 Bacteremia: Secondary | ICD-10-CM | POA: Diagnosis not present

## 2020-04-21 DIAGNOSIS — Z452 Encounter for adjustment and management of vascular access device: Secondary | ICD-10-CM | POA: Diagnosis not present

## 2020-04-21 DIAGNOSIS — G893 Neoplasm related pain (acute) (chronic): Secondary | ICD-10-CM | POA: Diagnosis not present

## 2020-04-21 DIAGNOSIS — F32A Depression, unspecified: Secondary | ICD-10-CM | POA: Diagnosis not present

## 2020-04-21 DIAGNOSIS — E039 Hypothyroidism, unspecified: Secondary | ICD-10-CM | POA: Diagnosis not present

## 2020-04-21 DIAGNOSIS — C7951 Secondary malignant neoplasm of bone: Secondary | ICD-10-CM | POA: Diagnosis not present

## 2020-04-21 DIAGNOSIS — E785 Hyperlipidemia, unspecified: Secondary | ICD-10-CM | POA: Diagnosis not present

## 2020-04-21 DIAGNOSIS — J019 Acute sinusitis, unspecified: Secondary | ICD-10-CM | POA: Diagnosis not present

## 2020-04-24 ENCOUNTER — Other Ambulatory Visit: Payer: Self-pay

## 2020-04-24 ENCOUNTER — Inpatient Hospital Stay (HOSPITAL_COMMUNITY): Payer: Medicare Other

## 2020-04-24 ENCOUNTER — Inpatient Hospital Stay (HOSPITAL_COMMUNITY): Payer: Medicare Other | Attending: Hematology | Admitting: Hematology

## 2020-04-24 VITALS — BP 183/77 | HR 56 | Temp 97.1°F | Resp 18 | Wt 161.2 lb

## 2020-04-24 DIAGNOSIS — E039 Hypothyroidism, unspecified: Secondary | ICD-10-CM | POA: Diagnosis not present

## 2020-04-24 DIAGNOSIS — C61 Malignant neoplasm of prostate: Secondary | ICD-10-CM

## 2020-04-24 DIAGNOSIS — I10 Essential (primary) hypertension: Secondary | ICD-10-CM | POA: Diagnosis not present

## 2020-04-24 DIAGNOSIS — C7951 Secondary malignant neoplasm of bone: Secondary | ICD-10-CM

## 2020-04-24 DIAGNOSIS — K5900 Constipation, unspecified: Secondary | ICD-10-CM | POA: Insufficient documentation

## 2020-04-24 LAB — CBC WITH DIFFERENTIAL/PLATELET
Abs Immature Granulocytes: 0.01 10*3/uL (ref 0.00–0.07)
Basophils Absolute: 0.1 10*3/uL (ref 0.0–0.1)
Basophils Relative: 2 %
Eosinophils Absolute: 1 10*3/uL — ABNORMAL HIGH (ref 0.0–0.5)
Eosinophils Relative: 13 %
HCT: 43.3 % (ref 39.0–52.0)
Hemoglobin: 13.7 g/dL (ref 13.0–17.0)
Immature Granulocytes: 0 %
Lymphocytes Relative: 23 %
Lymphs Abs: 1.7 10*3/uL (ref 0.7–4.0)
MCH: 30.5 pg (ref 26.0–34.0)
MCHC: 31.6 g/dL (ref 30.0–36.0)
MCV: 96.4 fL (ref 80.0–100.0)
Monocytes Absolute: 0.6 10*3/uL (ref 0.1–1.0)
Monocytes Relative: 8 %
Neutro Abs: 4 10*3/uL (ref 1.7–7.7)
Neutrophils Relative %: 54 %
Platelets: 275 10*3/uL (ref 150–400)
RBC: 4.49 MIL/uL (ref 4.22–5.81)
RDW: 13.2 % (ref 11.5–15.5)
WBC: 7.4 10*3/uL (ref 4.0–10.5)
nRBC: 0 % (ref 0.0–0.2)

## 2020-04-24 LAB — COMPREHENSIVE METABOLIC PANEL
ALT: 13 U/L (ref 0–44)
AST: 21 U/L (ref 15–41)
Albumin: 4.1 g/dL (ref 3.5–5.0)
Alkaline Phosphatase: 61 U/L (ref 38–126)
Anion gap: 9 (ref 5–15)
BUN: 13 mg/dL (ref 8–23)
CO2: 25 mmol/L (ref 22–32)
Calcium: 9.6 mg/dL (ref 8.9–10.3)
Chloride: 104 mmol/L (ref 98–111)
Creatinine, Ser: 0.58 mg/dL — ABNORMAL LOW (ref 0.61–1.24)
GFR, Estimated: >60 mL/min (ref >60)
Glucose, Bld: 98 mg/dL (ref 70–99)
Potassium: 4.1 mmol/L (ref 3.5–5.1)
Sodium: 138 mmol/L (ref 135–145)
Total Bilirubin: 0.2 mg/dL — ABNORMAL LOW (ref 0.3–1.2)
Total Protein: 7.2 g/dL (ref 6.5–8.1)

## 2020-04-24 LAB — PSA: Prostatic Specific Antigen: 0.01 ng/mL (ref 0.00–4.00)

## 2020-04-24 MED ORDER — LISINOPRIL 10 MG PO TABS: 10.0000 mg | ORAL_TABLET | Freq: Every day | ORAL | 6 refills | Status: DC

## 2020-04-24 MED ORDER — DENOSUMAB 120 MG/1.7ML ~~LOC~~ SOLN
120.0000 mg | Freq: Once | SUBCUTANEOUS | Status: AC
  Administered 2020-04-24: 120 mg via SUBCUTANEOUS
  Filled 2020-04-24: qty 1.7

## 2020-04-24 MED ORDER — HYDROMORPHONE HCL 4 MG PO TABS: 4.0000 mg | ORAL_TABLET | Freq: Three times a day (TID) | ORAL | 0 refills | Status: DC | PRN

## 2020-04-24 MED ORDER — PREDNISONE 5 MG PO TABS: 5.0000 mg | ORAL_TABLET | Freq: Every day | ORAL | 6 refills | Status: DC

## 2020-04-24 MED ORDER — FENTANYL 25 MCG/HR TD PT72: 1.0000 | MEDICATED_PATCH | TRANSDERMAL | 0 refills | Status: DC

## 2020-04-24 NOTE — Progress Notes (Signed)
Patient was assessed by Dr. Delton Coombes and labs have been reviewed.  Patient is okay to proceed with Xgeva injection today. Primary RN and pharmacy aware.

## 2020-04-24 NOTE — Progress Notes (Signed)
Keith Olson. presents today for denosumab injection. Lab work reviewed prior to administration. VSS. Pt reports taking Ca and Vit D as instructed. Pt denies tooth/jaw pain and denies recent or future invasive dental work. Injection tolerated well, see MAR for details. Site clean and dry, band aid applied. Pt discharged in satisfactory condition with follow up instructions.

## 2020-04-24 NOTE — Patient Instructions (Signed)
[facility] at [facility]  Discharge Instructions:  Denosumab injection What is this medicine? DENOSUMAB (den oh sue mab) slows bone breakdown. Prolia is used to treat osteoporosis in women after menopause and in men, and in people who are taking corticosteroids for 6 months or more. Xgeva is used to treat a high calcium level due to cancer and to prevent bone fractures and other bone problems caused by multiple myeloma or cancer bone metastases. Xgeva is also used to treat giant cell tumor of the bone. This medicine may be used for other purposes; ask your health care provider or pharmacist if you have questions. COMMON BRAND NAME(S): Prolia, XGEVA What should I tell my health care provider before I take this medicine? They need to know if you have any of these conditions:  dental disease  having surgery or tooth extraction  infection  kidney disease  low levels of calcium or Vitamin D in the blood  malnutrition  on hemodialysis  skin conditions or sensitivity  thyroid or parathyroid disease  an unusual reaction to denosumab, other medicines, foods, dyes, or preservatives  pregnant or trying to get pregnant  breast-feeding How should I use this medicine? This medicine is for injection under the skin. It is given by a health care professional in a hospital or clinic setting. A special MedGuide will be given to you before each treatment. Be sure to read this information carefully each time. For Prolia, talk to your pediatrician regarding the use of this medicine in children. Special care may be needed. For Xgeva, talk to your pediatrician regarding the use of this medicine in children. While this drug may be prescribed for children as young as 13 years for selected conditions, precautions do apply. Overdosage: If you think you have taken too much of this medicine contact a poison control center or emergency room at once. NOTE: This medicine is only for  you. Do not share this medicine with others. What if I miss a dose? It is important not to miss your dose. Call your doctor or health care professional if you are unable to keep an appointment. What may interact with this medicine? Do not take this medicine with any of the following medications:  other medicines containing denosumab This medicine may also interact with the following medications:  medicines that lower your chance of fighting infection  steroid medicines like prednisone or cortisone This list may not describe all possible interactions. Give your health care provider a list of all the medicines, herbs, non-prescription drugs, or dietary supplements you use. Also tell them if you smoke, drink alcohol, or use illegal drugs. Some items may interact with your medicine. What should I watch for while using this medicine? Visit your doctor or health care professional for regular checks on your progress. Your doctor or health care professional may order blood tests and other tests to see how you are doing. Call your doctor or health care professional for advice if you get a fever, chills or sore throat, or other symptoms of a cold or flu. Do not treat yourself. This drug may decrease your body's ability to fight infection. Try to avoid being around people who are sick. You should make sure you get enough calcium and vitamin D while you are taking this medicine, unless your doctor tells you not to. Discuss the foods you eat and the vitamins you take with your health care professional. See your dentist regularly. Brush and floss your teeth as directed.   Before you have any dental work done, tell your dentist you are receiving this medicine. Do not become pregnant while taking this medicine or for 5 months after stopping it. Talk with your doctor or health care professional about your birth control options while taking this medicine. Women should inform their doctor if they wish to become  pregnant or think they might be pregnant. There is a potential for serious side effects to an unborn child. Talk to your health care professional or pharmacist for more information. What side effects may I notice from receiving this medicine? Side effects that you should report to your doctor or health care professional as soon as possible:  allergic reactions like skin rash, itching or hives, swelling of the face, lips, or tongue  bone pain  breathing problems  dizziness  jaw pain, especially after dental work  redness, blistering, peeling of the skin  signs and symptoms of infection like fever or chills; cough; sore throat; pain or trouble passing urine  signs of low calcium like fast heartbeat, muscle cramps or muscle pain; pain, tingling, numbness in the hands or feet; seizures  unusual bleeding or bruising  unusually weak or tired Side effects that usually do not require medical attention (report to your doctor or health care professional if they continue or are bothersome):  constipation  diarrhea  headache  joint pain  loss of appetite  muscle pain  runny nose  tiredness  upset stomach This list may not describe all possible side effects. Call your doctor for medical advice about side effects. You may report side effects to FDA at 1-800-FDA-1088. Where should I keep my medicine? This medicine is only given in a clinic, doctor's office, or other health care setting and will not be stored at home. NOTE: This sheet is a summary. It may not cover all possible information. If you have questions about this medicine, talk to your doctor, pharmacist, or health care provider.  2021 [employer]vier/Gold Standard (2017-06-24 16:10:44)  _______________________________________________________________  Thank you for choosing [facility] at [facility] to provide your oncology and hematology care.  To afford each patient quality time with our providers,  please arrive at least 15 minutes before your scheduled appointment.  You need to re-schedule your appointment if you arrive 10 or more minutes late.  We strive to give you quality time with our providers, and arriving late affects you and other patients whose appointments are after yours.  Also, if you no show three or more times for appointments you may be dismissed from the clinic.  Again, thank you for choosing [facility] at [facility]. Our hope is that these requests will allow you access to exceptional care and in a timely manner. _______________________________________________________________  If you have questions after your visit, please contact our office at [phone] between the hours of 8:30 a.m. and 5:00 p.m. Voicemails left after 4:30 p.m. will not be returned until the following business day. _______________________________________________________________  For prescription refill requests, have your pharmacy contact our office. _______________________________________________________________  Recommendations made by the consultant and any test results will be sent to your referring physician. _______________________________________________________________ 

## 2020-04-24 NOTE — Patient Instructions (Signed)
[place] at [facility] Discharge Instructions  You were seen today by Dr. Delton Coombes. He went over your recent results. You received your Xgeva injection today; continue getting your Xgeva injection every month. You will be referred to physical therapy to improve your strength. Start taking lisinopril 10 mg every day. Continue taking Zytiga every morning and prednisone daily. Dr. Delton Coombes will see you back in 2 months for labs and follow up.   Thank you for choosing [place] at [facility] to provide your oncology and hematology care.  To afford each patient quality time with our provider, please arrive at least 15 minutes before your scheduled appointment time.   If you have a lab appointment with the [place] please come in thru the Main Entrance and check in at the main information desk  You need to re-schedule your appointment should you arrive 10 or more minutes late.  We strive to give you quality time with our providers, and arriving late affects you and other patients whose appointments are after yours.  Also, if you no show three or more times for appointments you may be dismissed from the clinic at the providers discretion.     Again, thank you for choosing [facility].  Our hope is that these requests will decrease the amount of time that you wait before being seen by our physicians.       _____________________________________________________________  Should you have questions after your visit to [facility], please contact our office at [phone] between the hours of 8:00 a.m. and 4:30 p.m.  Voicemails left after 4:00 p.m. will not be returned until the following business day.  For prescription refill requests, have your pharmacy contact our office and allow 72 hours.    Cancer Center Support Programs:   > Cancer Support Group  2nd Tuesday of the month 1pm-2pm, Journey Room

## 2020-04-24 NOTE — Progress Notes (Signed)
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    CANCER STAGING: Cancer Staging No matching staging information was found for the patient.  INTERVAL HISTORY:  [name], a 80 y.o. male, returns for routine follow-up of his metastatic castration sensitive prostate cancer. Ansel was last seen on 03/27/2020.   Today he is accompanied by his son, [name], and he reports feeling fair. He complains of being weak since being discharged for his pneumonia, but he denies pleuritic CP or SOB. He notes that his Dilaudid is not helping to control the pain in his shoulders, chest and hips and he is using fentanyl 25 mcg patch on his left chest. He reports not having any Dilaudid since coming out of the hospital on 02/09. He reports having multiple falls, 4-5 times on 02/23, due to his legs giving out. He is taking Zytiga 1,000 mg every AM, but is out of prednisone 5 mg. His appetite is decreased and eats 3 small meals a day, along with 3 cans of Ensure daily.  His son, [name], has the POA and he lives in [place].   REVIEW OF SYSTEMS:  Review of Systems  Constitutional: Positive for appetite change (50%) and fatigue (depleted).  Respiratory: Negative for shortness of breath.   Cardiovascular: Positive for palpitations. Negative for chest pain.  Musculoskeletal: Positive for arthralgias (10/10 shoulders & hips pain), back pain (10/10 lower back pain) and gait problem (d/t BLE weakness).  Neurological: Positive for extremity weakness (whole  body) and gait problem (d/t BLE weakness).  All other systems reviewed and are negative.   PAST MEDICAL/SURGICAL HISTORY:  Past Medical History:  Diagnosis Date  . Agent orange exposure   . Arthritis   . Coronary artery disease    BMS to mid LAD 2001, DES to proximal LAD 2017  . Enlarged prostate    XRT 2016  . Headache    Sinus headaches   . Heart abnormality   . History of depression   . Hyperlipidemia   . Hypothyroidism   . Prostate cancer ([facility]) 07/2014   hormonal therapy, external beam radiation therapy  . PTSD (post-traumatic stress disorder)    Past Surgical History:  Procedure Laterality Date  . BRONCHIAL BRUSHINGS  07/02/2019   Procedure: BRONCHIAL BRUSHINGS;  Surgeon: Rigoberto Noel, MD;  Location: [facility];  Service: Cardiopulmonary;;  . BRONCHIAL NEEDLE ASPIRATION BIOPSY  07/02/2019   Procedure: BRONCHIAL NEEDLE ASPIRATION BIOPSIES;  Surgeon: Rigoberto Noel, MD;  Location: [facility];  Service: Cardiopulmonary;;  . BRONCHIAL WASHINGS  07/02/2019   Procedure: BRONCHIAL WASHINGS;  Surgeon: Rigoberto Noel, MD;  [facility];  Service: Cardiopulmonary;;  . CARDIAC CATHETERIZATION N/A 12/12/2015   Procedure: Left Heart Cath and Coronary Angiography;  Surgeon: Peter M Martinique, MD;  Location: [facility];  Service: Cardiovascular;  Laterality: N/A;  . CARDIAC CATHETERIZATION N/A 12/12/2015   Procedure: Coronary Stent Intervention;  Surgeon: Peter M Martinique, MD;  Location: [facility];  Service: Cardiovascular;  Laterality: N/A;  . CORONARY STENT INTERVENTION N/A  09/06/2017   Procedure: CORONARY STENT INTERVENTION;  Surgeon: Burnell Blanks, MD;  Location: [facility];  Service: Cardiovascular;  Laterality: N/A;  . ENDOBRONCHIAL ULTRASOUND N/A 07/02/2019   Procedure: ENDOBRONCHIAL ULTRASOUND;  Surgeon: Rigoberto Noel, MD;  [facility];  Service: Cardiopulmonary;  Laterality: N/A;  . FLEXIBLE BRONCHOSCOPY  07/02/2019   Procedure:  FLEXIBLE BRONCHOSCOPY;  Surgeon: Rigoberto Noel, MD;  [facility];  Service: Cardiopulmonary;;  . HERNIA REPAIR Right   . LEFT HEART CATH AND CORONARY ANGIOGRAPHY N/A 09/02/2016   Procedure: Left Heart Cath and Coronary Angiography;  Surgeon: Leonie Man, MD;  Location: [facility];  Service: Cardiovascular;  Laterality: N/A;  . LEFT HEART CATH AND CORONARY ANGIOGRAPHY N/A 09/06/2017   Procedure: LEFT HEART CATH AND CORONARY ANGIOGRAPHY;  Surgeon: Burnell Blanks, MD;  Location: [facility];  Service: Cardiovascular;  Laterality: N/A;  . LEFT HEART CATH AND CORONARY ANGIOGRAPHY N/A 05/04/2019   Procedure: LEFT HEART CATH AND CORONARY ANGIOGRAPHY;  Surgeon: Martinique, Peter M, MD;  Location: [facility];  Service: Cardiovascular;  Laterality: N/A;  . PROSTATE BIOPSY N/A 08/28/2014   Procedure: BIOPSY TRANSRECTAL ULTRASONIC PROSTATE (TUBP);  Surgeon: Rana Snare, MD;  [facility];  Service: Urology;  Laterality: N/A;  . TEE WITHOUT CARDIOVERSION N/A 04/08/2020   Procedure: TRANSESOPHAGEAL ECHOCARDIOGRAM (TEE) WITH PROPOFOL;  Surgeon: Arnoldo Lenis, MD;  [facility];  Service: Endoscopy;  Laterality: N/A;  . TRANSURETHRAL RESECTION OF PROSTATE N/A 08/28/2014   Procedure: TRANSURETHRAL RESECTION OF THE PROSTATE WITH GYRUS INSTRUMENTS;  Surgeon: Rana Snare, MD;  Location: [facility];  Service: Urology;  Laterality: N/A;    SOCIAL HISTORY:  Social History   Socioeconomic History  . Marital status: Married    Spouse name: Not on file  . Number of children: 5  . Years of education: Not on file  . Highest education level: Not on file  Occupational History  . Occupation: retired  Tobacco Use  . Smoking status: Never Smoker  . Smokeless tobacco: Former Systems developer    Types: Secondary school teacher  . Vaping Use: Never used  Substance and Sexual Activity  . Alcohol use: No  . Drug use: No  . Sexual activity: Yes  Other Topics Concern  . Not on file   Social History Narrative   Married   5 children   Social Determinants of Health   Financial Resource Strain: Low Risk   . Difficulty of Paying Living Expenses: Not hard at all  Food Insecurity: No Food Insecurity  . Worried About Charity fundraiser in the Last Year: Never true  . Ran Out of Food in the Last Year: Never true  Transportation Needs: No Transportation Needs  . Lack of Transportation (Medical): No  . Lack of Transportation (Non-Medical): No  Physical Activity: Inactive  . Days of Exercise per Week: 0 days  . Minutes of Exercise per Session: 0 min  Stress: No Stress Concern Present  . Feeling of Stress : Not at all  Social Connections: Moderately Isolated  . Frequency of Communication with Friends and Family: More than three times a week  . Frequency of Social Gatherings with Friends and Family: More than three times a week  . Attends Religious Services: Never  . Active Member of Clubs or Organizations: No  . Attends Archivist Meetings: Never  . Marital Status: Married  Human resources officer Violence: Not At Risk  .  Fear of Current or Ex-Partner: No  . Emotionally Abused: No  . Physically Abused: No  . Sexually Abused: No    FAMILY HISTORY:  Family History  Problem Relation Age of Onset  . Arthritis Mother   . Heart attack Father     CURRENT MEDICATIONS:  Current Outpatient Medications  Medication Sig Dispense Refill  . abiraterone acetate (ZYTIGA) 250 MG tablet TAKE 4 TABLETS (1,000 MG TOTAL) BY MOUTH DAILY. TAKE ON AN EMPTY STOMACH 1 HOUR BEFORE OR 2 HOURS AFTER A MEAL 120 tablet 6  . ALPRAZolam (XANAX) 0.5 MG tablet Take 1 tablet (0.5 mg total) by mouth 2 (two) times daily as needed for anxiety. 10 tablet 0  . aspirin EC 81 MG tablet Take 1 tablet (81 mg total) by mouth daily.    Marland Kitchen ceFAZolin (ANCEF) 10 g injection     . cetirizine (ZYRTEC) 10 MG tablet Take 10 mg by mouth daily.    . diclofenac sodium (VOLTAREN) 1 % GEL APPLY 2 GRAMS TOPICALLY 4  TIMES DAILY (Patient taking differently: Apply 2 g topically 4 (four) times daily.) 100 g 5  . DULoxetine (CYMBALTA) 60 MG capsule Take 60 mg by mouth daily.    . fluticasone (FLONASE) 50 MCG/ACT nasal spray Place 2 sprays into both nostrils daily.    Marland Kitchen gabapentin (NEURONTIN) 400 MG capsule TAKE 1 CAPSULE BY MOUTH THREE TIMES DAILY 90 capsule 2  . lisinopril (PRINIVIL) 10 MG tablet Take 1 tablet (10 mg total) by mouth daily. 30 tablet 6  . NARCAN 4 MG/0.1ML LIQD nasal spray kit Place 1 spray into the nose once.    . nitroGLYCERIN (NITROSTAT) 0.4 MG SL tablet Place 1 tablet (0.4 mg total) under the tongue every 5 (five) minutes as needed for chest pain. X 3 doses 25 tablet 12  . pantoprazole (PROTONIX) 40 MG tablet Take 1 tablet (40 mg total) by mouth daily. 30 tablet 3  . polyethylene glycol powder (GLYCOLAX/MIRALAX) 17 GM/SCOOP powder Take 17 g by mouth daily as needed for mild constipation or moderate constipation.     . tamsulosin (FLOMAX) 0.4 MG CAPS capsule Take 0.4 mg by mouth daily.    Derrill Memo ON 05/09/2020] fentaNYL (DURAGESIC) 25 MCG/HR Place 1 patch onto the skin every 3 (three) days. 10 patch 0  . HYDROmorphone (DILAUDID) 4 MG tablet Take 1 tablet (4 mg total) by mouth every 8 (eight) hours as needed for severe pain. 90 tablet 0  . predniSONE (DELTASONE) 5 MG tablet Take 1 tablet (5 mg total) by mouth daily with breakfast. 30 tablet 6   No current facility-administered medications for this visit.   Facility-Administered Medications Ordered in Other Visits  Medication Dose Route Frequency Provider Last Rate Last Admin  . denosumab (XGEVA) injection 120 mg  120 mg Subcutaneous Once Derek Jack, MD        ALLERGIES:  Allergies  Allergen Reactions  . Lipitor [Atorvastatin]     Muscles aches    PHYSICAL EXAM:  Performance status (ECOG): 1 - Symptomatic but completely ambulatory  Vitals:   04/24/20 0901  BP: (!) 183/77  Pulse: (!) 56  Resp: 18  Temp: (!) 97.1 F  (36.2 C)  SpO2: 95%   Wt Readings from Last 3 Encounters:  04/24/20 161 lb 3.2 oz (73.1 kg)  04/04/20 160 lb 15 oz (73 kg)  03/27/20 162 lb 3 oz (73.6 kg)   Physical Exam Constitutional:      Comments: In wheelchair  LABORATORY DATA:  I have reviewed the labs as listed.  CBC Latest Ref Rng & Units 04/24/2020 04/06/2020 04/05/2020  WBC 4.0 - 10.5 K/uL 7.4 7.3 8.9  Hemoglobin 13.0 - 17.0 g/dL 13.7 10.8(L) 11.8(L)  Hematocrit 39.0 - 52.0 % 43.3 32.7(L) 36.4(L)  Platelets 150 - 400 K/uL 275 185 221   CMP Latest Ref Rng & Units 04/24/2020 04/05/2020 04/04/2020  Glucose 70 - 99 mg/dL 98 108(H) 106(H)  BUN 8 - 23 mg/dL 13 7(L) 9  Creatinine 0.61 - 1.24 mg/dL 0.58(L) 0.57(L) 0.60(L)  Sodium 135 - 145 mmol/L 138 136 136  Potassium 3.5 - 5.1 mmol/L 4.1 3.5 3.9  Chloride 98 - 111 mmol/L 104 106 105  CO2 22 - 32 mmol/L 25 22 -  Calcium 8.9 - 10.3 mg/dL 9.6 8.2(L) -  Total Protein 6.5 - 8.1 g/dL 7.2 6.3(L) -  Total Bilirubin 0.3 - 1.2 mg/dL 0.2(L) 0.6 -  Alkaline Phos 38 - 126 U/L 61 50 -  AST 15 - 41 U/L 21 19 -  ALT 0 - 44 U/L 13 17 -    DIAGNOSTIC IMAGING:  I have independently reviewed the scans and discussed with the patient. CT Head Wo Contrast  Result Date: 04/04/2020 CLINICAL DATA:  Eliezer Lofts lysed weakness EXAM: CT HEAD WITHOUT CONTRAST TECHNIQUE: Contiguous axial images were obtained from the base of the skull through the vertex without intravenous contrast. COMPARISON:  Jul 06, 2019 FINDINGS: Brain: No evidence of acute infarction, hemorrhage, hydrocephalus, extra-axial collection or mass lesion/mass effect. Global parenchymal volume loss. Periventricular white matter hypodensities consistent with sequela of chronic microvascular ischemic disease. Vascular: No hyperdense vessel or unexpected calcification. Skull: Normal. Negative for fracture or focal lesion. Sinuses/Orbits: Air-fluid levels within the sphenoid and RIGHT maxillary sinus. Mucosal thickening of multiple ethmoid air cells.  Other: None. IMPRESSION: 1. No acute intracranial abnormality. 2. Air-fluid levels within the sphenoid and RIGHT maxillary sinus. This could reflect acute sinusitis in the appropriate clinical setting. Electronically Signed   By: Valentino Saxon MD   On: 04/04/2020 12:41   CT CHEST ABDOMEN PELVIS W CONTRAST  Result Date: 04/04/2020 CLINICAL DATA:  Weakness, cough, nasal congestion, abdominal pain and emesis. EXAM: CT CHEST, ABDOMEN, AND PELVIS WITH CONTRAST TECHNIQUE: Multidetector CT imaging of the chest, abdomen and pelvis was performed following the standard protocol during bolus administration of intravenous contrast. CONTRAST:  131m OMNIPAQUE IOHEXOL 300 MG/ML  SOLN COMPARISON:  PET 03/03/2020. FINDINGS: CT CHEST FINDINGS Cardiovascular: Atherosclerotic calcification of the aorta, aortic valve and coronary arteries. Heart size within normal limits. No pericardial effusion. Mediastinum/Nodes: No pathologically enlarged mediastinal, hilar or axillary lymph nodes. Esophagus is unremarkable. Lungs/Pleura: Image quality is degraded by respiratory motion and expiratory phase imaging. Calcified granuloma in the right upper lobe. Paraspinal scarring/atelectasis in the medial right lower lobe. Probable nodular scarring in the apex of the left upper lobe (4/13). New airspace consolidation and ground-glass in the superior segment left lower lobe. Minimal peribronchovascular ground-glass in the lingula. No pleural fluid. Airway is unremarkable. Musculoskeletal: Sclerotic lesions are seen throughout the visualized osseous structures. CT ABDOMEN PELVIS FINDINGS Hepatobiliary: Subcentimeter low-attenuation lesion in the left hepatic lobe is too small to characterize but unchanged. Liver and gallbladder are unremarkable. No biliary ductal dilatation. Pancreas: Negative. Spleen: Negative. Adrenals/Urinary Tract: Adrenal glands and right kidney are unremarkable. Subcentimeter low-attenuation lesion in the left kidney is  too small to characterize but statistically, a cyst is likely. Ureters are decompressed. TURP defect in the bladder. Bladder is otherwise grossly  unremarkable. Stomach/Bowel: Small hiatal hernia. Stomach, small bowel, appendix in colon are otherwise unremarkable. Vascular/Lymphatic: Atherosclerotic calcification of the aorta. No pathologically enlarged lymph nodes. Reproductive: Prostatectomy. Other: No free fluid. Mesenteries and peritoneum are unremarkable. Tiny umbilical hernia contains fat. Musculoskeletal: Sclerotic lesions are again seen throughout the visualized osseous structures. IMPRESSION: 1. New left lower lobe pneumonia. 2. Diffuse osseous metastatic disease. 3. Aortic atherosclerosis (ICD10-I70.0). Coronary artery calcification. Electronically Signed   By: Lorin Picket M.D.   On: 04/04/2020 12:45   DG Chest Port 1 View  Result Date: 04/04/2020 CLINICAL DATA:  Questionable sepsis EXAM: PORTABLE CHEST 1 VIEW COMPARISON:  07/20/2019, CT chest, 06/27/2019 FINDINGS: Low volume AP portable examination with mild diffuse interstitial pulmonary opacity. Redemonstrated pulmonary nodule of the left upper lobe. The heart and mediastinum are unremarkable. Osseous structures are unremarkable. IMPRESSION: 1. Low volume AP portable examination with mild diffuse interstitial pulmonary opacity, likely edema. This appearance may be exaggerated by low volume technique. 2. Redemonstrated pulmonary nodule of the left upper lobe, better assessed by prior CT. Electronically Signed   By: Eddie Candle M.D.   On: 04/04/2020 10:40   ECHOCARDIOGRAM COMPLETE  Result Date: 04/06/2020    ECHOCARDIOGRAM REPORT   Patient Name:   NAIN RUDD Date of Exam: 04/06/2020 Medical Rec #:  [id number]    Height:       66.0 in Accession #:    [id number]   Weight:       160.9 lb Date of Birth:  1941/02/06    BSA:          1.823 m Patient Age:    80 years     BP:           111/53 mmHg Patient Gender: M            HR:           72 bpm. Exam  Location:  [facility] Procedure: 2D Echo Indications:    Bacteremia R78.81  History:        Patient has prior history of Echocardiogram examinations, most                 recent 09/06/2017. CAD, Signs/Symptoms:Chest Pain; Risk                 Factors:Non-Smoker and Dyslipidemia.  Sonographer:    Leavy Cella RDCS (AE) Referring Phys: Leon[place]  1. Left ventricular ejection fraction, by estimation, is 60 to 65%. The left ventricle has normal function. The left ventricle has no regional wall motion abnormalities. There is mild left ventricular hypertrophy. Left ventricular diastolic parameters are consistent with Grade I diastolic dysfunction (impaired relaxation).  2. Right ventricular systolic function is normal. The right ventricular size is normal. Tricuspid regurgitation signal is inadequate for assessing PA pressure.  3. The mitral valve is normal in structure. No evidence of mitral valve regurgitation. No evidence of mitral stenosis.  4. The aortic valve is tricuspid. There is mild calcification of the aortic valve. Aortic valve regurgitation is mild. No aortic stenosis is present.  5. The inferior vena cava is normal in size with greater than 50% respiratory variability, suggesting right atrial pressure of 3 mmHg. Conclusion(s)/Recommendation(s): No intracardiac source of embolism detected on this transthoracic study. A transesophageal echocardiogram can be considered to exclude cardiac source of embolism if clinically indicated. FINDINGS  Left Ventricle: Left ventricular ejection fraction, by estimation, is 60 to 65%. The left ventricle has normal function. The left ventricle has  no regional wall motion abnormalities. The left ventricular internal cavity size was normal in size. There is  mild left ventricular hypertrophy. Left ventricular diastolic parameters are consistent with Grade I diastolic dysfunction (impaired relaxation). Right Ventricle: The right ventricular size is  normal. No increase in right ventricular wall thickness. Right ventricular systolic function is normal. Tricuspid regurgitation signal is inadequate for assessing PA pressure. Left Atrium: Left atrial size was normal in size. Right Atrium: Right atrial size was normal in size. Pericardium: There is no evidence of pericardial effusion. Mitral Valve: The mitral valve is normal in structure. No evidence of mitral valve regurgitation. No evidence of mitral valve stenosis. Tricuspid Valve: The tricuspid valve is normal in structure. Tricuspid valve regurgitation is trivial. No evidence of tricuspid stenosis. Aortic Valve: The aortic valve is tricuspid. There is mild calcification of the aortic valve. Aortic valve regurgitation is mild. No aortic stenosis is present. Pulmonic Valve: The pulmonic valve was grossly normal. Pulmonic valve regurgitation is not visualized. No evidence of pulmonic stenosis. Aorta: The aortic root is normal in size and structure. Venous: The inferior vena cava is normal in size with greater than 50% respiratory variability, suggesting right atrial pressure of 3 mmHg. IAS/Shunts: No atrial level shunt detected by color flow Doppler.  LEFT VENTRICLE PLAX 2D LVIDd:         4.34 cm  Diastology LVIDs:         2.00 cm  LV e' medial:    7.07 cm/s LV PW:         1.15 cm  LV E/e' medial:  11.6 LV IVS:        1.03 cm  LV e' lateral:   8.16 cm/s LVOT diam:     1.90 cm  LV E/e' lateral: 10.1 LV SV:         73 LV SV Index:   40 LVOT Area:     2.84 cm  RIGHT VENTRICLE RV S prime:     23.50 cm/s TAPSE (M-mode): 3.2 cm LEFT ATRIUM             Index       RIGHT ATRIUM           Index LA diam:        3.30 cm 1.81 cm/m  RA Area:     14.20 cm LA Vol (A2C):   46.8 ml 25.67 ml/m RA Volume:   31.80 ml  17.44 ml/m LA Vol (A4C):   56.0 ml 30.71 ml/m LA Biplane Vol: 52.6 ml 28.85 ml/m  AORTIC VALVE LVOT Vmax:   110.12 cm/s LVOT Vmean:  79.536 cm/s LVOT VTI:    0.258 m  AORTA Ao Root diam: 3.00 cm MITRAL VALVE MV  Area (PHT): 2.99 cm    SHUNTS MV Decel Time: 254 msec    Systemic VTI:  0.26 m MV E velocity: 82.30 cm/s  Systemic Diam: 1.90 cm MV A velocity: 89.10 cm/s MV E/A ratio:  0.92 Cherlynn Kaiser MD Electronically signed by Cherlynn Kaiser MD Signature Date/Time: 04/06/2020/1:20:50 PM    Final    ECHO TEE  Result Date: 04/08/2020    TRANSESOPHOGEAL ECHO REPORT   Patient Name:   Yusif Hiawatha Merriott. Date of Exam: 04/08/2020 Medical Rec #:  [id number]        Height:       66.0 in Accession #:    [id number]       Weight:       160.9 lb Date of Birth:  [birth year]        BSA:          1.823 m Patient Age:    72 years         BP:           117/59 mmHg Patient Gender: M                HR:           57 bpm. Exam Location:  [facility] Procedure: Transesophageal Echo Indications:    Bacteremia 790.7 / R78.81  History:        Patient has prior history of Echocardiogram examinations, most                 recent 04/06/2020. CAD; Risk Factors:Dyslipidemia and Non-Smoker.                 DNR.  Sonographer:    Leavy Cella RDCS (AE) Referring Phys: [id number] Alphonse Guild BRANCH PROCEDURE: The transesophogeal probe was passed without difficulty through the esophogus of the patient. Local oropharyngeal anesthetic was provided with viscous lidocaine. Sedation performed by performing physician. The patient developed no complications during the procedure. IMPRESSIONS  1. Left ventricular ejection fraction, by estimation, is 60 to 65%. The left ventricle has normal function.  2. Right ventricular systolic function is normal. The right ventricular size is normal.  3. No left atrial/left atrial appendage thrombus was detected. The LAA emptying velocity was 70 cm/s.  4. The mitral valve is normal in structure. Mild mitral valve regurgitation. No evidence of mitral stenosis.  5. The aortic valve is tricuspid. Aortic valve regurgitation is mild. No aortic stenosis is present. Conclusion(s)/Recommendation(s): No evidence of vegetation/infective  endocarditis on this transesophageal echocardiogram. FINDINGS  Left Ventricle: Left ventricular ejection fraction, by estimation, is 60 to 65%. The left ventricle has normal function. The left ventricular internal cavity size was normal in size. Right Ventricle: The right ventricular size is normal. No increase in right ventricular wall thickness. Right ventricular systolic function is normal. Left Atrium: Left atrial size was normal in size. No left atrial/left atrial appendage thrombus was detected. The LAA emptying velocity was 70 cm/s. Right Atrium: Right atrial size was normal in size. Pericardium: There is no evidence of pericardial effusion. Mitral Valve: The mitral valve is normal in structure. Mild mitral valve regurgitation. No evidence of mitral valve stenosis. Tricuspid Valve: The tricuspid valve is normal in structure. Tricuspid valve regurgitation is trivial. No evidence of tricuspid stenosis. Aortic Valve: The aortic valve is tricuspid. Aortic valve regurgitation is mild. No aortic stenosis is present. Pulmonic Valve: The pulmonic valve was not well visualized. Pulmonic valve regurgitation is mild. No evidence of pulmonic stenosis. Aorta: Mild plaque in the visualized portion of the descending aorta. The aortic root is normal in size and structure. IAS/Shunts: No atrial level shunt detected by color flow Doppler.   AORTA Ao Root diam: 3.50 cm Carlyle Dolly MD Electronically signed by Carlyle Dolly MD Signature Date/Time: 04/08/2020/11:22:12 AM    Final    [place] EKG SITE RITE  Result Date: 04/08/2020 If Site Rite image not attached, placement could not be confirmed due to current cardiac rhythm.    ASSESSMENT:  1. Metastatic castration sensitive prostate cancer: -History of prostate cancer diagnosed 08/28/2014, Gleason 4+4=8, status post XRT and seed implants. -Presentation with low back pain of several months, PSA on 06/19/2019 was 239, testosterone 210, alkaline phosphatase 306. -MRI of the  cervical, thoracic and lumbar spine on 07/27/2019  shows diffuse bone metastatic disease. Mild to moderate compression deformity of T3 with mild ventral epidural extension. No cord compression. 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 -CT angio of the chest, abdomen and pelvis on 06/27/2019 shows multiple lung nodules bilaterally, largest in the left upper lobe measuring 2.5 x 1.6 x 1.5 cm with associated left hilar and AP window lymphadenopathy. -Navigational bronchoscopy and biopsy on 07/02/2019 with upper lobe brushing and washing shows atypical cells. 10 L FNA also shows atypical cells. -1 pack/day for 4-5 years, quit 60 years ago. Positive agent orange exposure -Lupron 45 mg on 07/12/2019 along with 1 month ofCasodex. -PET scan on 08/11/2019 shows mildly hypermetabolic widespread sclerotic osseous metastasis throughout the axial and proximal appendicular skeleton, increased sclerosis since 06/27/2018 on CT angiogram. This could represent response to therapy. No hypermetabolic lymphadenopathy. Left mediastinal and left hilar adenopathy is seen on 06/19/2019 has resolved. Bilateral pulmonary nodules are stable to decreased in size. Dominant left upper lobe lung nodule has decreased from 2.5 cm to 1.7 cm. -Abiraterone was started around 08/29/2019. -PET scan was done on 03/03/2020 as he complained of joint pains.  New mildly hypermetabolic mildly enlarged right subcarinal lymph node measuring 1 cm, SUV 4.1.  No other lymphadenopathy.  Widespread sclerotic lesions throughout the axial and proximal appendicular skeleton, appearing stable in distribution and increase in sclerosis.  2. Low back pain: -10 treatments of radiation therapy completed on 07/11/2019.   PLAN:  1. Metastatic castration sensitive prostate cancer: -He is taking Abiraterone 4 tablets in the morning. -He is not sure if he is taking  prednisone.  I have reiterated him to take prednisone.  We have sent a refill again.  Reviewed his labs today which showed potassium 4.1.  Blood pressure is high.  LFTs are normal.  PSA is 0.01. -He feels weak in the legs since discharge.  We will make a referral for PT. -Continue Abiraterone.  RTC 8 weeks for follow-up.  2. Low back pain: -Continue fentanyl patch 25 mcg daily. -He reportedly lost Dilaudid while he was hospitalized.  He gave [place] conflicting stories of how he lost them.  He did not provide police report. -Today he is due for his next refill of Dilaudid.  He was requesting dose increase.  He reports that he did not take his medication since hospital discharge.  I will give him same dose of Dilaudid 4 mg every 8 hours as needed.  3.Loss of appetite: -Continue Marinol 2.5 mg twice daily. -Continue Ensure 3 cans/day.  4. Constipation: -Continue stool softeners.  5. Bone mets: -Continue denosumab monthly.  Calcium is normal.  Continue calcium and vitamin D.  6. Hypertension: -He stopped taking lisinopril while he was hospitalized.  Blood pressure today is 183/77.  Restart lisinopril 10 mg daily.  We have sent a refill.  7.  Hot flashes: -Continue black cohosh.   Orders placed this encounter:  No orders of the defined types were placed in this encounter.    Derek Jack, MD [place] [phone]   I, Milinda Antis, am acting as a scribe for Dr. Sanda Linger.  I, Derek Jack MD, have reviewed the above documentation for accuracy and completeness, and I agree with the above.

## 2020-04-25 ENCOUNTER — Telehealth (HOSPITAL_COMMUNITY): Payer: Self-pay

## 2020-04-25 MED ORDER — DRONABINOL 2.5 MG PO CAPS: 2.5000 mg | ORAL_CAPSULE | Freq: Two times a day (BID) | ORAL | 2 refills | Status: AC

## 2020-04-25 NOTE — Telephone Encounter (Signed)
Nutrition  Called patient to let him know that Dr Raliegh Ip has sent prescription in for marinol 2.5 mg.  Patient appreciative of call.   Joli B. Zenia Resides, [place], [place] Registered Dietitian [phone] (mobile)

## 2020-04-25 NOTE — Telephone Encounter (Signed)
Nutrition Follow-up:  Patient with prostate cancer with mets to bone.  Patient receiving lupron and zytiga  Spoke with patient via phone for nutrition follow-up.  Patient reports that his appetite is "zero" and has been for the last 2 weeks or so.  "I don't feel like eating anything but I can trying to eat."  "Is there something I can take to get my appetite back."  Reports that he is trying to eat bowl of oatmeal this am.  Yesterday ate bowl of soup around 2pm and bowl of cornflakes at 8pm.  Has been drinking ensure plus shakes about 2-3 per day.    Noted hospital admission from 2/4-2/9 for pneumonia  Medications: reviewed  Labs: reviewed  Anthropometrics:   Weight 161 lb 3.2 oz on 2/24   158 lb on 1/11 155 lb on 10/7   NUTRITION DIAGNOSIS: Inadequate oral intake continues  INTERVENTION:  Will send message to provider regarding patient request for appetite stimulant Discussed ways to add calories and protein in diet.  If able increase ensure plus to 3-4 per day    MONITORING, EVALUATION, GOAL: weight trends, intake   NEXT VISIT: March 25 phone call  Jaydalynn Olivero B. Zenia Resides, [place], [place] Registered Dietitian [phone] (mobile)  '

## 2020-04-25 NOTE — Addendum Note (Signed)
Addended by: Derek Jack on: 04/25/2020 10:01 AM   Modules accepted: Orders

## 2020-04-29 ENCOUNTER — Telehealth (HOSPITAL_COMMUNITY): Payer: Self-pay

## 2020-04-29 NOTE — Telephone Encounter (Signed)
This nurse called patient to see if appetite has increased since being started a Marinol to stimulate appetite.  Patient was not available at this time.

## 2020-05-05 ENCOUNTER — Ambulatory Visit (HOSPITAL_COMMUNITY): Payer: Medicare Other | Admitting: Physical Therapy

## 2020-05-06 ENCOUNTER — Other Ambulatory Visit (HOSPITAL_COMMUNITY): Payer: Self-pay | Admitting: Hematology

## 2020-05-06 DIAGNOSIS — C61 Malignant neoplasm of prostate: Secondary | ICD-10-CM

## 2020-05-06 DIAGNOSIS — C7951 Secondary malignant neoplasm of bone: Secondary | ICD-10-CM

## 2020-05-07 ENCOUNTER — Other Ambulatory Visit (HOSPITAL_COMMUNITY): Payer: Self-pay

## 2020-05-07 DIAGNOSIS — C61 Malignant neoplasm of prostate: Secondary | ICD-10-CM

## 2020-05-07 MED FILL — ABIRATERONE ACETATE 250 MG: 250 | 30 days supply | Qty: 120 | Fill #5

## 2020-05-14 ENCOUNTER — Ambulatory Visit (HOSPITAL_COMMUNITY): Payer: Medicare Other | Admitting: Physical Therapy

## 2020-05-19 ENCOUNTER — Other Ambulatory Visit (HOSPITAL_COMMUNITY): Payer: Self-pay

## 2020-05-19 DIAGNOSIS — C61 Malignant neoplasm of prostate: Secondary | ICD-10-CM

## 2020-05-19 DIAGNOSIS — C7951 Secondary malignant neoplasm of bone: Secondary | ICD-10-CM

## 2020-05-19 MED ORDER — FENTANYL 25 MCG/HR TD PT72: 1.0000 | MEDICATED_PATCH | TRANSDERMAL | 0 refills | Status: DC

## 2020-05-19 MED ORDER — HYDROMORPHONE HCL 4 MG PO TABS: 4.0000 mg | ORAL_TABLET | Freq: Three times a day (TID) | ORAL | 0 refills | Status: DC | PRN

## 2020-05-22 ENCOUNTER — Inpatient Hospital Stay (HOSPITAL_COMMUNITY): Payer: Medicare Other

## 2020-05-22 ENCOUNTER — Other Ambulatory Visit (HOSPITAL_COMMUNITY): Payer: Medicare Other

## 2020-05-22 ENCOUNTER — Ambulatory Visit (HOSPITAL_COMMUNITY): Payer: Medicare Other | Admitting: Hematology

## 2020-05-23 ENCOUNTER — Inpatient Hospital Stay (HOSPITAL_COMMUNITY): Payer: Medicare Other

## 2020-05-23 ENCOUNTER — Other Ambulatory Visit (HOSPITAL_COMMUNITY): Payer: Self-pay

## 2020-05-23 ENCOUNTER — Other Ambulatory Visit (HOSPITAL_COMMUNITY): Payer: Medicare Other

## 2020-05-23 ENCOUNTER — Other Ambulatory Visit: Payer: Self-pay

## 2020-05-23 ENCOUNTER — Inpatient Hospital Stay (HOSPITAL_COMMUNITY): Payer: Medicare Other | Attending: Hematology

## 2020-05-23 ENCOUNTER — Encounter (HOSPITAL_COMMUNITY): Payer: Self-pay | Admitting: *Deleted

## 2020-05-23 ENCOUNTER — Ambulatory Visit (HOSPITAL_COMMUNITY): Payer: Medicare Other

## 2020-05-23 VITALS — BP 125/68 | HR 70 | Resp 18 | Wt 159.2 lb

## 2020-05-23 DIAGNOSIS — C61 Malignant neoplasm of prostate: Secondary | ICD-10-CM

## 2020-05-23 DIAGNOSIS — C7951 Secondary malignant neoplasm of bone: Secondary | ICD-10-CM | POA: Insufficient documentation

## 2020-05-23 LAB — CBC WITH DIFFERENTIAL/PLATELET
Abs Immature Granulocytes: 0.01 10*3/uL (ref 0.00–0.07)
Basophils Absolute: 0.1 10*3/uL (ref 0.0–0.1)
Basophils Relative: 1 %
Eosinophils Absolute: 0.6 10*3/uL — ABNORMAL HIGH (ref 0.0–0.5)
Eosinophils Relative: 9 %
HCT: 44.1 % (ref 39.0–52.0)
Hemoglobin: 13.9 g/dL (ref 13.0–17.0)
Immature Granulocytes: 0 %
Lymphocytes Relative: 26 %
Lymphs Abs: 1.8 10*3/uL (ref 0.7–4.0)
MCH: 30.5 pg (ref 26.0–34.0)
MCHC: 31.5 g/dL (ref 30.0–36.0)
MCV: 96.7 fL (ref 80.0–100.0)
Monocytes Absolute: 0.6 10*3/uL (ref 0.1–1.0)
Monocytes Relative: 8 %
Neutro Abs: 3.9 10*3/uL (ref 1.7–7.7)
Neutrophils Relative %: 56 %
Platelets: 270 10*3/uL (ref 150–400)
RBC: 4.56 MIL/uL (ref 4.22–5.81)
RDW: 12.9 % (ref 11.5–15.5)
WBC: 7 10*3/uL (ref 4.0–10.5)
nRBC: 0 % (ref 0.0–0.2)

## 2020-05-23 LAB — COMPREHENSIVE METABOLIC PANEL
ALT: 21 U/L (ref 0–44)
AST: 22 U/L (ref 15–41)
Albumin: 4.3 g/dL (ref 3.5–5.0)
Alkaline Phosphatase: 55 U/L (ref 38–126)
Anion gap: 12 (ref 5–15)
BUN: 14 mg/dL (ref 8–23)
CO2: 25 mmol/L (ref 22–32)
Calcium: 9.3 mg/dL (ref 8.9–10.3)
Chloride: 100 mmol/L (ref 98–111)
Creatinine, Ser: 0.68 mg/dL (ref 0.61–1.24)
GFR, Estimated: >60 mL/min (ref >60)
Glucose, Bld: 125 mg/dL — ABNORMAL HIGH (ref 70–99)
Potassium: 4.5 mmol/L (ref 3.5–5.1)
Sodium: 137 mmol/L (ref 135–145)
Total Bilirubin: 0.5 mg/dL (ref 0.3–1.2)
Total Protein: 7.3 g/dL (ref 6.5–8.1)

## 2020-05-23 LAB — PSA: Prostatic Specific Antigen: 0.02 ng/mL (ref 0.00–4.00)

## 2020-05-23 MED ORDER — DENOSUMAB 120 MG/1.7ML ~~LOC~~ SOLN
120.0000 mg | Freq: Once | SUBCUTANEOUS | Status: AC
  Administered 2020-05-23: 120 mg via SUBCUTANEOUS
  Filled 2020-05-23: qty 1.7

## 2020-05-23 NOTE — Progress Notes (Signed)
Nutrition Follow-up:  Patient with prostate cancer metastatic to bone, receiving Lupron and Zytiga.   Nutrition follow-up completed with patient and daughter in clinic. Patient reports appetite is a little better. Patient started Marinol for appetite on 3/1, he is not so sure it is working and does not take consistently. He has not eaten today. Yesterday, he ate a bowl of oatmeal made with water, did not eat lunch because they were out running errands, and ate tomato soup made with whole milk with ritz crackers. Daughter reports she tries to make sure he eats 3 meals daily. He enjoys drinking chocolate milk, has a couple of glasses daily and drinking 1-2 Ensure High Protein (160 kcal, 16 g protein each).    Medications: reviewed  Labs: 3/25 labs not released at this time  Anthropometrics: Weight 158.84 lbs in clinic today decreased from 161 lbs 3.2 oz on 2/24  NUTRITION DIAGNOSIS: Inadequate oral intake continues   INTERVENTION:  Encouraged taking Marinol as prescribed Discussed strategies for increasing calories and protein (using milk to prepare oatmeal, soups, adding cheese to eggs) Discussed trying small frequent meals  High calorie/protein snack ideas Handouts provided  Recommend patient switch to more nutrient dense supplement and drink 3 daily Complimentary case of Ensure provided today   MONITORING, EVALUATION, GOAL: weight trends, oral intake   NEXT VISIT: Thursday, April 21 in clinic  [place] [phone] (cell)

## 2020-05-23 NOTE — Progress Notes (Signed)
Patient tolerated Xgeva injection with no complaints voiced.  Site clean and dry with no bruising or swelling noted.  No complaints of pain.  Discharged with vital signs stable and no signs or symptoms of distress noted.

## 2020-06-04 ENCOUNTER — Other Ambulatory Visit (HOSPITAL_COMMUNITY): Payer: Self-pay

## 2020-06-04 ENCOUNTER — Ambulatory Visit (HOSPITAL_COMMUNITY): Payer: Medicare Other | Admitting: Physical Therapy

## 2020-06-04 ENCOUNTER — Ambulatory Visit (HOSPITAL_COMMUNITY): Payer: Medicare Other | Attending: Hematology | Admitting: Physical Therapy

## 2020-06-05 ENCOUNTER — Other Ambulatory Visit (HOSPITAL_COMMUNITY): Payer: Self-pay

## 2020-06-05 DIAGNOSIS — C61 Malignant neoplasm of prostate: Secondary | ICD-10-CM

## 2020-06-05 DIAGNOSIS — C7951 Secondary malignant neoplasm of bone: Secondary | ICD-10-CM

## 2020-06-06 ENCOUNTER — Other Ambulatory Visit (HOSPITAL_COMMUNITY): Payer: Self-pay

## 2020-06-06 DIAGNOSIS — C61 Malignant neoplasm of prostate: Secondary | ICD-10-CM

## 2020-06-10 ENCOUNTER — Other Ambulatory Visit (HOSPITAL_COMMUNITY): Payer: Self-pay

## 2020-06-10 DIAGNOSIS — C7951 Secondary malignant neoplasm of bone: Secondary | ICD-10-CM

## 2020-06-10 DIAGNOSIS — C61 Malignant neoplasm of prostate: Secondary | ICD-10-CM

## 2020-06-10 MED ORDER — FENTANYL 25 MCG/HR TD PT72: 1.0000 | MEDICATED_PATCH | TRANSDERMAL | 0 refills | Status: DC

## 2020-06-10 MED ORDER — ABIRATERONE ACETATE 250 MG PO TABS
ORAL_TABLET | Freq: Every day | ORAL | 6 refills | Status: AC
  Filled 2020-06-10: qty 120, fill #0
  Filled 2020-06-12: qty 120, 30d supply, fill #0
  Filled 2020-07-07: qty 120, 30d supply, fill #1
  Filled 2020-08-01: qty 120, 30d supply, fill #2
  Filled 2020-08-28: qty 120, 30d supply, fill #3
  Filled 2020-10-03: qty 120, 30d supply, fill #4
  Filled 2020-10-27: qty 120, 30d supply, fill #5
  Filled 2020-12-03: qty 120, 30d supply, fill #6

## 2020-06-11 ENCOUNTER — Other Ambulatory Visit (HOSPITAL_COMMUNITY): Payer: Self-pay

## 2020-06-12 ENCOUNTER — Other Ambulatory Visit (HOSPITAL_COMMUNITY): Payer: Self-pay

## 2020-06-14 ENCOUNTER — Other Ambulatory Visit (HOSPITAL_COMMUNITY): Payer: Self-pay

## 2020-06-16 ENCOUNTER — Other Ambulatory Visit (HOSPITAL_COMMUNITY): Payer: Self-pay

## 2020-06-17 ENCOUNTER — Other Ambulatory Visit (HOSPITAL_COMMUNITY): Payer: Self-pay

## 2020-06-18 ENCOUNTER — Other Ambulatory Visit (HOSPITAL_COMMUNITY): Payer: Self-pay | Admitting: *Deleted

## 2020-06-18 DIAGNOSIS — C7951 Secondary malignant neoplasm of bone: Secondary | ICD-10-CM

## 2020-06-18 DIAGNOSIS — C61 Malignant neoplasm of prostate: Secondary | ICD-10-CM

## 2020-06-19 ENCOUNTER — Other Ambulatory Visit: Payer: Self-pay

## 2020-06-19 ENCOUNTER — Inpatient Hospital Stay (HOSPITAL_COMMUNITY): Payer: Medicare Other

## 2020-06-19 ENCOUNTER — Inpatient Hospital Stay (HOSPITAL_COMMUNITY): Payer: Medicare Other | Attending: Hematology

## 2020-06-19 ENCOUNTER — Inpatient Hospital Stay (HOSPITAL_BASED_OUTPATIENT_CLINIC_OR_DEPARTMENT_OTHER): Payer: Medicare Other | Admitting: Hematology

## 2020-06-19 ENCOUNTER — Inpatient Hospital Stay (HOSPITAL_COMMUNITY): Payer: Medicare Other | Admitting: Dietician

## 2020-06-19 VITALS — BP 116/71 | HR 81 | Temp 96.9°F | Resp 18 | Wt 162.9 lb

## 2020-06-19 VITALS — BP 116/71 | HR 81 | Temp 96.9°F | Resp 18 | Wt 162.6 lb

## 2020-06-19 DIAGNOSIS — C7951 Secondary malignant neoplasm of bone: Secondary | ICD-10-CM

## 2020-06-19 DIAGNOSIS — C61 Malignant neoplasm of prostate: Secondary | ICD-10-CM

## 2020-06-19 DIAGNOSIS — E039 Hypothyroidism, unspecified: Secondary | ICD-10-CM | POA: Insufficient documentation

## 2020-06-19 DIAGNOSIS — M25551 Pain in right hip: Secondary | ICD-10-CM | POA: Diagnosis not present

## 2020-06-19 DIAGNOSIS — I10 Essential (primary) hypertension: Secondary | ICD-10-CM | POA: Insufficient documentation

## 2020-06-19 DIAGNOSIS — R630 Anorexia: Secondary | ICD-10-CM | POA: Insufficient documentation

## 2020-06-19 DIAGNOSIS — K5900 Constipation, unspecified: Secondary | ICD-10-CM | POA: Insufficient documentation

## 2020-06-19 LAB — COMPREHENSIVE METABOLIC PANEL WITH GFR
ALT: 23 U/L (ref 0–44)
AST: 20 U/L (ref 15–41)
Albumin: 4.1 g/dL (ref 3.5–5.0)
Alkaline Phosphatase: 62 U/L (ref 38–126)
Anion gap: 8 (ref 5–15)
BUN: 12 mg/dL (ref 8–23)
CO2: 27 mmol/L (ref 22–32)
Calcium: 9.4 mg/dL (ref 8.9–10.3)
Chloride: 103 mmol/L (ref 98–111)
Creatinine, Ser: 0.62 mg/dL (ref 0.61–1.24)
GFR, Estimated: >60 mL/min (ref >60)
Glucose, Bld: 89 mg/dL (ref 70–99)
Potassium: 4.5 mmol/L (ref 3.5–5.1)
Sodium: 138 mmol/L (ref 135–145)
Total Bilirubin: 0.5 mg/dL (ref 0.3–1.2)
Total Protein: 7.1 g/dL (ref 6.5–8.1)

## 2020-06-19 LAB — CBC WITH DIFFERENTIAL/PLATELET
Abs Immature Granulocytes: 0.02 10*3/uL (ref 0.00–0.07)
Basophils Absolute: 0.1 10*3/uL (ref 0.0–0.1)
Basophils Relative: 1 %
Eosinophils Absolute: 0.4 10*3/uL (ref 0.0–0.5)
Eosinophils Relative: 6 %
HCT: 40.7 % (ref 39.0–52.0)
Hemoglobin: 13.1 g/dL (ref 13.0–17.0)
Immature Granulocytes: 0 %
Lymphocytes Relative: 17 %
Lymphs Abs: 1.3 10*3/uL (ref 0.7–4.0)
MCH: 30.8 pg (ref 26.0–34.0)
MCHC: 32.2 g/dL (ref 30.0–36.0)
MCV: 95.8 fL (ref 80.0–100.0)
Monocytes Absolute: 0.7 10*3/uL (ref 0.1–1.0)
Monocytes Relative: 9 %
Neutro Abs: 5.2 10*3/uL (ref 1.7–7.7)
Neutrophils Relative %: 67 %
Platelets: 262 10*3/uL (ref 150–400)
RBC: 4.25 MIL/uL (ref 4.22–5.81)
RDW: 13.2 % (ref 11.5–15.5)
WBC: 7.7 10*3/uL (ref 4.0–10.5)
nRBC: 0 % (ref 0.0–0.2)

## 2020-06-19 LAB — SAMPLE TO BLOOD BANK

## 2020-06-19 LAB — PSA: Prostatic Specific Antigen: <0.01 ng/mL (ref 0.00–4.00)

## 2020-06-19 MED ORDER — HYDROMORPHONE HCL 4 MG PO TABS: 4.0000 mg | ORAL_TABLET | Freq: Three times a day (TID) | ORAL | 0 refills | Status: DC | PRN

## 2020-06-19 MED ORDER — DENOSUMAB 120 MG/1.7ML ~~LOC~~ SOLN
SUBCUTANEOUS | Status: AC
  Filled 2020-06-19: qty 1.7

## 2020-06-19 MED ORDER — DENOSUMAB 120 MG/1.7ML ~~LOC~~ SOLN
120.0000 mg | Freq: Once | SUBCUTANEOUS | Status: AC
  Administered 2020-06-19: 120 mg via SUBCUTANEOUS

## 2020-06-19 NOTE — Progress Notes (Signed)
Keith Olson. presents today for denosumab injection. Lab work reviewed prior to administration. VSS. Pt reports taking Ca and Vit D as instructed. Pt denies tooth/jaw pain and denies recent or future invasive dental work. Injection tolerated well, see MAR for details. Site clean and dry, band aid applied. Pt discharged in satisfactory condition with follow up instructions.

## 2020-06-19 NOTE — Progress Notes (Signed)
Nutrition Follow-up:  Patient with prostate cancer metastatic to bone. He is receiving Lupron and Zytiga.   Met with patient and daughter of patient in clinic. He reports appetite is much improved and eating more. Patient is not taking stimulant, daughter reports it is in the fridge but he has not needed to take it. He is drinking 3-4 Ensure daily, requesting assistance with additional case today. Patient eats oatmeal made with milk, honey, cinnamon or egg sandwich with cheese, mayo and ketchup for breakfast, likes to snack for lunch, and eats a lot of soups for dinner. Patient reports his energy level has increased, has good days and bad days but tries to move/walk some each day.    Medications: reviewed  Labs: reviewed  Anthropometrics: Weight 162 lb 14.4 oz today increased 4 lbs in the last month  3/25 - 158 lb 8.4 oz 2/24 - 161 lb 3.2 oz   NUTRITION DIAGNOSIS: Inadequate oral intake improved   INTERVENTION:  Patient to continue high calorie, high protein foods for weight maintenance Complimentary case of Ensure Plus given today     MONITORING, EVALUATION, GOAL: weight trends, intake   NEXT VISIT: Thursday May 19 in clinic

## 2020-06-19 NOTE — Patient Instructions (Signed)
[place] at [facility] Discharge Instructions  You were seen today by Dr. Delton Coombes. He went over your recent results. You received your Xgeva injection today; continue getting it every month. You will also receive your Eligard injection in May. Dr. Delton Coombes will see you back in 2 months for labs and follow up.   Thank you for choosing [place] at [facility] to provide your oncology and hematology care.  To afford each patient quality time with our provider, please arrive at least 15 minutes before your scheduled appointment time.   If you have a lab appointment with the [facility] please come in thru the Main Entrance and check in at the main information desk  You need to re-schedule your appointment should you arrive 10 or more minutes late.  We strive to give you quality time with our providers, and arriving late affects you and other patients whose appointments are after yours.  Also, if you no show three or more times for appointments you may be dismissed from the clinic at the providers discretion.     Again, thank you for choosing [facility].  Our hope is that these requests will decrease the amount of time that you wait before being seen by our physicians.       _____________________________________________________________  Should you have questions after your visit to [facility], please contact our office at [phone] between the hours of 8:00 a.m. and 4:30 p.m.  Voicemails left after 4:00 p.m. will not be returned until the following business day.  For prescription refill requests, have your pharmacy contact our office and allow 72 hours.    Cancer Center Support Programs:   > Cancer Support Group  2nd Tuesday of the month 1pm-2pm, Journey Room

## 2020-06-19 NOTE — Progress Notes (Signed)
[name], [name]   CLINIC:  Medical Oncology/Hematology  PCP:  Dettinger, Fransisca Kaufmann, MD [place] / [place] [address] [phone]   REASON FOR VISIT:  Follow-up for metastatic castration sensitive prostate cancer  PRIOR THERAPY: Radiation therapy & seed implants  NGS Results: Pending  CURRENT THERAPY: Zytiga 1,000 mg daily; Xgeva monthly; Lupron every 6 months  BRIEF ONCOLOGIC HISTORY:  Oncology History   No history exists.    CANCER STAGING: Cancer Staging No matching staging information was found for the patient.  INTERVAL HISTORY:  [name], a 80 y.o. male, returns for routine follow-up of his metastatic castration sensitive prostate cancer. Keith Olson was last seen on 04/24/2020.   Today he is accompanied by his daughter and he reports feeling okay. He complains of having intermittent right hip pain which radiates down his right leg, though he reports that he is tolerating further walks. He is taking 1 to 3 tablets of Dilaudid a day PRN for pain and uses the fentanyl patch every 3 days; he switches sides when applying the fentanyl patch. He is taking Miralax daily for his constipation. He has stopped taking black cohosh since his hot flashes have decreased in frequency to once daily. He is taking Zytiga every morning on an empty stomach and prednisone 5 mg with breakfast. His appetite is good and fluctuates day by day. He is taking calcium and a vitamin D daily.   REVIEW OF SYSTEMS:  Review of Systems  Constitutional: Positive for appetite change (75%) and fatigue (25%).  Endocrine: Positive for hot flashes (once a day).  Musculoskeletal: Positive for arthralgias (R hip pain radiating down leg).  Psychiatric/Behavioral: Positive for sleep disturbance.  All other systems reviewed and are negative.   PAST MEDICAL/SURGICAL HISTORY:  Past Medical History:  Diagnosis Date  . Agent orange exposure   .  Arthritis   . Coronary artery disease    BMS to mid LAD 2001, DES to proximal LAD 2017  . Enlarged prostate    XRT 2016  . Headache    Sinus headaches   . Heart abnormality   . History of depression   . Hyperlipidemia   . Hypothyroidism   . Prostate cancer ([place]) 07/2014   hormonal therapy, external beam radiation therapy  . PTSD (post-traumatic stress disorder)    Past Surgical History:  Procedure Laterality Date  . BRONCHIAL BRUSHINGS  07/02/2019   Procedure: BRONCHIAL BRUSHINGS;  Surgeon: Rigoberto Noel, MD;  Location: [facility];  Service: Cardiopulmonary;;  . BRONCHIAL NEEDLE ASPIRATION BIOPSY  07/02/2019   Procedure: BRONCHIAL NEEDLE ASPIRATION BIOPSIES;  Surgeon: Rigoberto Noel, MD;  Location: [facility];  Service: Cardiopulmonary;;  . BRONCHIAL WASHINGS  07/02/2019   Procedure: BRONCHIAL WASHINGS;  Surgeon: Rigoberto Noel, MD;  [facility]: [facility];  Service: Cardiopulmonary;;  . CARDIAC CATHETERIZATION N/A 12/12/2015   Procedure: Left Heart Cath and Coronary Angiography;  Surgeon: Peter M Martinique, MD;  Location: [facility];  Service: Cardiovascular;  Laterality: N/A;  . CARDIAC CATHETERIZATION N/A 12/12/2015   Procedure: Coronary Stent Intervention;  Surgeon: Peter M Martinique, MD;  Location: [facility];  Service: Cardiovascular;  Laterality: N/A;  . CORONARY STENT INTERVENTION N/A 09/06/2017   Procedure: CORONARY STENT INTERVENTION;  Surgeon: Burnell Blanks, MD;  Location: [facility];  Service: Cardiovascular;  Laterality: N/A;  . ENDOBRONCHIAL ULTRASOUND N/A 07/02/2019   Procedure: ENDOBRONCHIAL ULTRASOUND;  Surgeon: Elsworth Soho,  Leanna Sato, MD;  Location: [facility];  Service: Cardiopulmonary;  Laterality: N/A;  . FLEXIBLE BRONCHOSCOPY  07/02/2019   Procedure: FLEXIBLE BRONCHOSCOPY;  Surgeon: Rigoberto Noel, MD;  [facility]: [facility];  Service: Cardiopulmonary;;  . HERNIA REPAIR Right   . LEFT HEART CATH AND CORONARY ANGIOGRAPHY N/A 09/02/2016    Procedure: Left Heart Cath and Coronary Angiography;  Surgeon: Leonie Man, MD;  Location: [facility];  Service: Cardiovascular;  Laterality: N/A;  . LEFT HEART CATH AND CORONARY ANGIOGRAPHY N/A 09/06/2017   Procedure: LEFT HEART CATH AND CORONARY ANGIOGRAPHY;  Surgeon: Burnell Blanks, MD;  Location: [facility];  Service: Cardiovascular;  Laterality: N/A;  . LEFT HEART CATH AND CORONARY ANGIOGRAPHY N/A 05/04/2019   Procedure: LEFT HEART CATH AND CORONARY ANGIOGRAPHY;  Surgeon: Martinique, Peter M, MD;  Location: [facility];  Service: Cardiovascular;  Laterality: N/A;  . PROSTATE BIOPSY N/A 08/28/2014   Procedure: BIOPSY TRANSRECTAL ULTRASONIC PROSTATE (TUBP);  Surgeon: Rana Snare, MD;  [facility];  Service: Urology;  Laterality: N/A;  . TEE WITHOUT CARDIOVERSION N/A 04/08/2020   Procedure: TRANSESOPHAGEAL ECHOCARDIOGRAM (TEE) WITH PROPOFOL;  Surgeon: Arnoldo Lenis, MD;  [facility];  Service: Endoscopy;  Laterality: N/A;  . TRANSURETHRAL RESECTION OF PROSTATE N/A 08/28/2014   Procedure: TRANSURETHRAL RESECTION OF THE PROSTATE WITH GYRUS INSTRUMENTS;  Surgeon: Rana Snare, MD;  Location: [facility];  Service: Urology;  Laterality: N/A;    SOCIAL HISTORY:  Social History   Socioeconomic History  . Marital status: Married    Spouse name: Not on file  . Number of children: 5  . Years of education: Not on file  . Highest education level: Not on file  Occupational History  . Occupation: retired  Tobacco Use  . Smoking status: Never Smoker  . Smokeless tobacco: Former Systems developer    Types: Secondary school teacher  . Vaping Use: Never used  Substance and Sexual Activity  . Alcohol use: No  . Drug use: No  . Sexual activity: Yes  Other Topics Concern  . Not on file  Social History Narrative   Married   5 children   Social Determinants of Health   Financial Resource Strain: Low Risk   . Difficulty of Paying Living Expenses: Not hard at all  Food  Insecurity: No Food Insecurity  . Worried About Charity fundraiser in the Last Year: Never true  . Ran Out of Food in the Last Year: Never true  Transportation Needs: No Transportation Needs  . Lack of Transportation (Medical): No  . Lack of Transportation (Non-Medical): No  Physical Activity: Inactive  . Days of Exercise per Week: 0 days  . Minutes of Exercise per Session: 0 min  Stress: No Stress Concern Present  . Feeling of Stress : Not at all  Social Connections: Moderately Isolated  . Frequency of Communication with Friends and Family: More than three times a week  . Frequency of Social Gatherings with Friends and Family: More than three times a week  . Attends Religious Services: Never  . Active Member of Clubs or Organizations: No  . Attends Archivist Meetings: Never  . Marital Status: Married  Human resources officer Violence: Not At Risk  . Fear of Current or Ex-Partner: No  . Emotionally Abused: No  . Physically Abused: No  . Sexually Abused: No    FAMILY HISTORY:  Family History  Problem Relation Age of Onset  . Arthritis Mother   .  Heart attack Father     CURRENT MEDICATIONS:  Current Outpatient Medications  Medication Sig Dispense Refill  . abiraterone acetate (ZYTIGA) 250 MG tablet TAKE 4 TABLETS (1,000 MG TOTAL) BY MOUTH DAILY. TAKE ON AN EMPTY STOMACH 1 HOUR BEFORE OR 2 HOURS AFTER A MEAL 120 tablet 6  . ALPRAZolam (XANAX) 0.5 MG tablet Take 1 tablet (0.5 mg total) by mouth 2 (two) times daily as needed for anxiety. 10 tablet 0  . aspirin EC 81 MG tablet Take 1 tablet (81 mg total) by mouth daily.    Marland Kitchen ceFAZolin (ANCEF) 10 g injection     . cetirizine (ZYRTEC) 10 MG tablet Take 10 mg by mouth daily.    . diclofenac sodium (VOLTAREN) 1 % GEL APPLY 2 GRAMS TOPICALLY 4 TIMES DAILY (Patient taking differently: Apply 2 g topically 4 (four) times daily.) 100 g 5  . dronabinol (MARINOL) 2.5 MG capsule Take 1 capsule (2.5 mg total) by mouth 2 (two) times daily  before a meal. 60 capsule 2  . DULoxetine (CYMBALTA) 60 MG capsule Take 60 mg by mouth daily.    . fentaNYL (DURAGESIC) 25 MCG/HR Place 1 patch onto the skin every 3 (three) days. 10 patch 0  . fluticasone (FLONASE) 50 MCG/ACT nasal spray Place 2 sprays into both nostrils daily.    Marland Kitchen gabapentin (NEURONTIN) 400 MG capsule TAKE 1 CAPSULE BY MOUTH THREE TIMES DAILY 90 capsule 2  . lisinopril (PRINIVIL) 10 MG tablet Take 1 tablet (10 mg total) by mouth daily. 30 tablet 6  . NARCAN 4 MG/0.1ML LIQD nasal spray kit Place 1 spray into the nose once.    . nitroGLYCERIN (NITROSTAT) 0.4 MG SL tablet Place 1 tablet (0.4 mg total) under the tongue every 5 (five) minutes as needed for chest pain. X 3 doses 25 tablet 12  . pantoprazole (PROTONIX) 40 MG tablet Take 1 tablet (40 mg total) by mouth daily. 30 tablet 3  . polyethylene glycol powder (GLYCOLAX/MIRALAX) 17 GM/SCOOP powder Take 17 g by mouth daily as needed for mild constipation or moderate constipation.     . predniSONE (DELTASONE) 5 MG tablet TAKE 1 TABLET (5 MG TOTAL) BY MOUTH DAILY WITH BREAKFAST. 30 tablet 6  . tamsulosin (FLOMAX) 0.4 MG CAPS capsule Take 0.4 mg by mouth daily.    Marland Kitchen HYDROmorphone (DILAUDID) 4 MG tablet Take 1 tablet (4 mg total) by mouth every 8 (eight) hours as needed for severe pain. 90 tablet 0   No current facility-administered medications for this visit.    ALLERGIES:  Allergies  Allergen Reactions  . Lipitor [Atorvastatin]     Muscles aches    PHYSICAL EXAM:  Performance status (ECOG): 1 - Symptomatic but completely ambulatory  Vitals:   06/19/20 1051  BP: 116/71  Pulse: 81  Resp: 18  Temp: (!) 96.9 F (36.1 C)  SpO2: 95%   Wt Readings from Last 3 Encounters:  06/19/20 162 lb 9.6 oz (73.8 kg)  05/23/20 159 lb 2.8 oz (72.2 kg)  04/24/20 161 lb 3.2 oz (73.1 kg)   Physical Exam Vitals reviewed.  Constitutional:      Appearance: Normal appearance.  Cardiovascular:     Rate and Rhythm: Normal rate and  regular rhythm.     Pulses: Normal pulses.     Heart sounds: Normal heart sounds.  Pulmonary:     Effort: Pulmonary effort is normal.     Breath sounds: Normal breath sounds.  Chest:     Comments: Pain patch on  L chest Neurological:     General: No focal deficit present.     Mental Status: He is alert and oriented to person, place, and time.  Psychiatric:        Mood and Affect: Mood normal.        Behavior: Behavior normal.      LABORATORY DATA:  I have reviewed the labs as listed.  CBC Latest Ref Rng & Units 06/19/2020 05/23/2020 04/24/2020  WBC 4.0 - 10.5 K/uL 7.7 7.0 7.4  Hemoglobin 13.0 - 17.0 g/dL 13.1 13.9 13.7  Hematocrit 39.0 - 52.0 % 40.7 44.1 43.3  Platelets 150 - 400 K/uL 262 270 275   CMP Latest Ref Rng & Units 06/19/2020 05/23/2020 04/24/2020  Glucose 70 - 99 mg/dL 89 125(H) 98  BUN 8 - 23 mg/dL _0 Creatinine 0.61 - 1.24 mg/dL 0.62 0.68 0.58(L)  Sodium 135 - 145 mmol/L 138 137 138  Potassium 3.5 - 5.1 mmol/L 4.5 4.5 4.1  Chloride 98 - 111 mmol/L 103 100 104  CO2 22 - 32 mmol/L _1 Calcium 8.9 - 10.3 mg/dL 9.4 9.3 9.6  Total Protein 6.5 - 8.1 g/dL 7.1 7.3 7.2  Total Bilirubin 0.3 - 1.2 mg/dL 0.5 0.5 0.2(L)  Alkaline Phos 38 - 126 U/L 62 55 61  AST 15 - 41 U/L _2 ALT 0 - 44 U/L _3 DIAGNOSTIC IMAGING:  I have independently reviewed the scans and discussed with the patient. No results found.   ASSESSMENT:  1. Metastatic castration sensitive prostate cancer: -History of prostate cancer diagnosed 08/28/2014, Gleason 4+4=8, status post XRT and seed implants. -Presentation with low back pain of several months, PSA on 06/19/2019 was 239, testosterone 210, alkaline phosphatase 306. -MRI of the cervical, thoracic and lumbar spine on 07/27/2019 shows diffuse bone metastatic disease. Mild to moderate compression deformity of T3 with mild ventral epidural extension. No cord compression. Mild compression deformity of L4 with ventral epidural  extension resulting in severe canal stenosis and crowding of the cauda equina. -CT angio of the chest, abdomen and pelvis on 06/27/2019 shows multiple lung nodules bilaterally, largest in the left upper lobe measuring 2.5 x 1.6 x 1.5 cm with associated left hilar and AP window lymphadenopathy. -Navigational bronchoscopy and biopsy on 07/02/2019 with upper lobe brushing and washing shows atypical cells. 10 L FNA also shows atypical cells. -1 pack/day for 4-5 years, quit 60 years ago. Positive agent orange exposure -Lupron 45 mg on 07/12/2019 along with 1 month ofCasodex. -PET scan on 08/11/2019 shows mildly hypermetabolic widespread sclerotic osseous metastasis throughout the axial and proximal appendicular skeleton, increased sclerosis since 06/27/2018 on CT angiogram. This could represent response to therapy. No hypermetabolic lymphadenopathy. Left mediastinal and left hilar adenopathy is seen on 06/19/2019 has resolved. Bilateral pulmonary nodules are stable to decreased in size. Dominant left upper lobe lung nodule has decreased from 2.5 cm to 1.7 cm. -Abiraterone was started around 08/29/2019. -PET scan was done on 03/03/2020 as he complained of joint pains. New mildly hypermetabolic mildly enlarged right subcarinal lymph node measuring 1 cm, SUV 4.1. No other lymphadenopathy. Widespread sclerotic lesions throughout the axial and proximal appendicular skeleton, appearing stable in distribution and increase in sclerosis.  2. Low back pain: -10 treatments of radiation therapy completed on 07/11/2019.   PLAN:  1. Metastatic castration sensitive prostate cancer: -He is taking Abiraterone 4 tablets daily in the mornings on an empty stomach. - Continue prednisone 5  mg daily. - His PSA today has come down to less than 0.01. - Continue Abiraterone 1000 mg daily along with prednisone 5 mg daily. - RTC 2 months for follow-up with repeat PSA.  Will consider scans if there is any progression.  2.   Right hip pain: -He reports right hip pain intermittently worse going down the right leg. - He is using fentanyl patch 25 mcg.  He is taking Dilaudid anywhere between 1 to 3 tablets/day. - We will refill his pain medication.  3.Loss of appetite: -Continue Marinol 2.5 mg twice daily. - Continue ensures 3 cans/day.  4. Constipation: -Continue stool softener and MiraLAX.  5. Bone mets: -Continue calcium and vitamin D supplements.  Calcium is normal.  Continue denosumab monthly.  6. Hypertension: -Continue lisinopril 10 mg daily.  Blood pressure today is better controlled.  7. Hot flashes: -He is not requiring on a daily basis.  Use as needed.  8.  High risk drug monitoring: - His blood pressure is stable.  Potassium is normal.  LFTs are normal.   Orders placed this encounter:  No orders of the defined types were placed in this encounter.    Derek Jack, MD [place] [phone]   I, Milinda Antis, am acting as a scribe for Dr. Sanda Linger.  I, Derek Jack MD, have reviewed the above documentation for accuracy and completeness, and I agree with the above.

## 2020-06-19 NOTE — Patient Instructions (Signed)
[facility] at [facility]  Discharge Instructions:  Denosumab injection What is this medicine? DENOSUMAB (den oh sue mab) slows bone breakdown. Prolia is used to treat osteoporosis in women after menopause and in men, and in people who are taking corticosteroids for 6 months or more. Xgeva is used to treat a high calcium level due to cancer and to prevent bone fractures and other bone problems caused by multiple myeloma or cancer bone metastases. Xgeva is also used to treat giant cell tumor of the bone. This medicine may be used for other purposes; ask your health care provider or pharmacist if you have questions. COMMON BRAND NAME(S): Prolia, XGEVA What should I tell my health care provider before I take this medicine? They need to know if you have any of these conditions:  dental disease  having surgery or tooth extraction  infection  kidney disease  low levels of calcium or Vitamin D in the blood  malnutrition  on hemodialysis  skin conditions or sensitivity  thyroid or parathyroid disease  an unusual reaction to denosumab, other medicines, foods, dyes, or preservatives  pregnant or trying to get pregnant  breast-feeding How should I use this medicine? This medicine is for injection under the skin. It is given by a health care professional in a hospital or clinic setting. A special MedGuide will be given to you before each treatment. Be sure to read this information carefully each time. For Prolia, talk to your pediatrician regarding the use of this medicine in children. Special care may be needed. For Xgeva, talk to your pediatrician regarding the use of this medicine in children. While this drug may be prescribed for children as young as 13 years for selected conditions, precautions do apply. Overdosage: If you think you have taken too much of this medicine contact a poison control center or emergency room at once. NOTE: This medicine is only for  you. Do not share this medicine with others. What if I miss a dose? It is important not to miss your dose. Call your doctor or health care professional if you are unable to keep an appointment. What may interact with this medicine? Do not take this medicine with any of the following medications:  other medicines containing denosumab This medicine may also interact with the following medications:  medicines that lower your chance of fighting infection  steroid medicines like prednisone or cortisone This list may not describe all possible interactions. Give your health care provider a list of all the medicines, herbs, non-prescription drugs, or dietary supplements you use. Also tell them if you smoke, drink alcohol, or use illegal drugs. Some items may interact with your medicine. What should I watch for while using this medicine? Visit your doctor or health care professional for regular checks on your progress. Your doctor or health care professional may order blood tests and other tests to see how you are doing. Call your doctor or health care professional for advice if you get a fever, chills or sore throat, or other symptoms of a cold or flu. Do not treat yourself. This drug may decrease your body's ability to fight infection. Try to avoid being around people who are sick. You should make sure you get enough calcium and vitamin D while you are taking this medicine, unless your doctor tells you not to. Discuss the foods you eat and the vitamins you take with your health care professional. See your dentist regularly. Brush and floss your teeth as directed.   Before you have any dental work done, tell your dentist you are receiving this medicine. Do not become pregnant while taking this medicine or for 5 months after stopping it. Talk with your doctor or health care professional about your birth control options while taking this medicine. Women should inform their doctor if they wish to become  pregnant or think they might be pregnant. There is a potential for serious side effects to an unborn child. Talk to your health care professional or pharmacist for more information. What side effects may I notice from receiving this medicine? Side effects that you should report to your doctor or health care professional as soon as possible:  allergic reactions like skin rash, itching or hives, swelling of the face, lips, or tongue  bone pain  breathing problems  dizziness  jaw pain, especially after dental work  redness, blistering, peeling of the skin  signs and symptoms of infection like fever or chills; cough; sore throat; pain or trouble passing urine  signs of low calcium like fast heartbeat, muscle cramps or muscle pain; pain, tingling, numbness in the hands or feet; seizures  unusual bleeding or bruising  unusually weak or tired Side effects that usually do not require medical attention (report to your doctor or health care professional if they continue or are bothersome):  constipation  diarrhea  headache  joint pain  loss of appetite  muscle pain  runny nose  tiredness  upset stomach This list may not describe all possible side effects. Call your doctor for medical advice about side effects. You may report side effects to FDA at 1-800-FDA-1088. Where should I keep my medicine? This medicine is only given in a clinic, doctor's office, or other health care setting and will not be stored at home. NOTE: This sheet is a summary. It may not cover all possible information. If you have questions about this medicine, talk to your doctor, pharmacist, or health care provider.  2021 Elsevier/Gold Standard (2017-06-24 16:10:44)  _______________________________________________________________  Thank you for choosing [facility] at [facility] to provide your oncology and hematology care.  To afford each patient quality time with our providers,  please arrive at least 15 minutes before your scheduled appointment.  You need to re-schedule your appointment if you arrive 10 or more minutes late.  We strive to give you quality time with our providers, and arriving late affects you and other patients whose appointments are after yours.  Also, if you no show three or more times for appointments you may be dismissed from the clinic.  Again, thank you for choosing [facility] at [facility]. Our hope is that these requests will allow you access to exceptional care and in a timely manner. _______________________________________________________________  If you have questions after your visit, please contact our office at [phone] between the hours of 8:30 a.m. and 5:00 p.m. Voicemails left after 4:30 p.m. will not be returned until the following business day. _______________________________________________________________  For prescription refill requests, have your pharmacy contact our office. _______________________________________________________________  Recommendations made by the consultant and any test results will be sent to your referring physician. _______________________________________________________________ 

## 2020-06-19 NOTE — Progress Notes (Signed)
Patient was assessed by Dr. Delton Coombes and labs have been reviewed.  Patient is okay to proceed with Xgeva injection today. Primary RN and pharmacy aware.

## 2020-06-30 ENCOUNTER — Ambulatory Visit (HOSPITAL_COMMUNITY): Payer: Medicare Other | Attending: Hematology

## 2020-07-02 ENCOUNTER — Other Ambulatory Visit (HOSPITAL_COMMUNITY): Payer: Self-pay

## 2020-07-02 MED ORDER — LACTULOSE 10 GM/15ML PO SOLN: 20.0000 g | ORAL | 0 refills | Status: AC | PRN

## 2020-07-03 ENCOUNTER — Other Ambulatory Visit (HOSPITAL_COMMUNITY): Payer: Self-pay

## 2020-07-03 DIAGNOSIS — C61 Malignant neoplasm of prostate: Secondary | ICD-10-CM

## 2020-07-04 ENCOUNTER — Other Ambulatory Visit (HOSPITAL_COMMUNITY): Payer: Self-pay

## 2020-07-04 MED ORDER — FENTANYL 25 MCG/HR TD PT72: 1.0000 | MEDICATED_PATCH | TRANSDERMAL | 0 refills | Status: DC

## 2020-07-07 ENCOUNTER — Other Ambulatory Visit (HOSPITAL_COMMUNITY): Payer: Self-pay

## 2020-07-09 ENCOUNTER — Other Ambulatory Visit (HOSPITAL_COMMUNITY): Payer: Self-pay | Admitting: Hematology

## 2020-07-10 ENCOUNTER — Other Ambulatory Visit (HOSPITAL_COMMUNITY): Payer: Self-pay | Admitting: *Deleted

## 2020-07-10 ENCOUNTER — Other Ambulatory Visit (HOSPITAL_COMMUNITY): Payer: Self-pay

## 2020-07-10 ENCOUNTER — Inpatient Hospital Stay (HOSPITAL_COMMUNITY): Payer: Medicare Other

## 2020-07-10 ENCOUNTER — Inpatient Hospital Stay (HOSPITAL_COMMUNITY): Payer: Medicare Other | Attending: Hematology | Admitting: Hematology

## 2020-07-10 ENCOUNTER — Other Ambulatory Visit: Payer: Self-pay

## 2020-07-10 VITALS — BP 149/70 | HR 67 | Temp 97.4°F | Resp 19 | Wt 167.5 lb

## 2020-07-10 DIAGNOSIS — C7951 Secondary malignant neoplasm of bone: Secondary | ICD-10-CM

## 2020-07-10 DIAGNOSIS — M25551 Pain in right hip: Secondary | ICD-10-CM | POA: Diagnosis not present

## 2020-07-10 DIAGNOSIS — C61 Malignant neoplasm of prostate: Secondary | ICD-10-CM

## 2020-07-10 DIAGNOSIS — Z5111 Encounter for antineoplastic chemotherapy: Secondary | ICD-10-CM | POA: Insufficient documentation

## 2020-07-10 DIAGNOSIS — I10 Essential (primary) hypertension: Secondary | ICD-10-CM | POA: Diagnosis not present

## 2020-07-10 DIAGNOSIS — K5900 Constipation, unspecified: Secondary | ICD-10-CM | POA: Diagnosis not present

## 2020-07-10 LAB — CBC WITH DIFFERENTIAL/PLATELET
Abs Immature Granulocytes: 0.02 10*3/uL (ref 0.00–0.07)
Basophils Absolute: 0.1 10*3/uL (ref 0.0–0.1)
Basophils Relative: 1 %
Eosinophils Absolute: 0.4 10*3/uL (ref 0.0–0.5)
Eosinophils Relative: 6 %
HCT: 38.1 % — ABNORMAL LOW (ref 39.0–52.0)
Hemoglobin: 12.9 g/dL — ABNORMAL LOW (ref 13.0–17.0)
Immature Granulocytes: 0 %
Lymphocytes Relative: 23 %
Lymphs Abs: 1.7 10*3/uL (ref 0.7–4.0)
MCH: 31.3 pg (ref 26.0–34.0)
MCHC: 33.9 g/dL (ref 30.0–36.0)
MCV: 92.5 fL (ref 80.0–100.0)
Monocytes Absolute: 0.7 10*3/uL (ref 0.1–1.0)
Monocytes Relative: 10 %
Neutro Abs: 4.2 10*3/uL (ref 1.7–7.7)
Neutrophils Relative %: 60 %
Platelets: 239 10*3/uL (ref 150–400)
RBC: 4.12 MIL/uL — ABNORMAL LOW (ref 4.22–5.81)
RDW: 13.5 % (ref 11.5–15.5)
WBC: 7 10*3/uL (ref 4.0–10.5)
nRBC: 0 % (ref 0.0–0.2)

## 2020-07-10 LAB — COMPREHENSIVE METABOLIC PANEL
ALT: 22 U/L (ref 0–44)
AST: 23 U/L (ref 15–41)
Albumin: 4.1 g/dL (ref 3.5–5.0)
Alkaline Phosphatase: 48 U/L (ref 38–126)
Anion gap: 8 (ref 5–15)
BUN: 14 mg/dL (ref 8–23)
CO2: 25 mmol/L (ref 22–32)
Calcium: 8.8 mg/dL — ABNORMAL LOW (ref 8.9–10.3)
Chloride: 104 mmol/L (ref 98–111)
Creatinine, Ser: 0.55 mg/dL — ABNORMAL LOW (ref 0.61–1.24)
GFR, Estimated: >60 mL/min (ref >60)
Glucose, Bld: 108 mg/dL — ABNORMAL HIGH (ref 70–99)
Potassium: 3.4 mmol/L — ABNORMAL LOW (ref 3.5–5.1)
Sodium: 137 mmol/L (ref 135–145)
Total Bilirubin: 0.5 mg/dL (ref 0.3–1.2)
Total Protein: 6.9 g/dL (ref 6.5–8.1)

## 2020-07-10 MED ORDER — HYDROMORPHONE HCL 4 MG PO TABS
4.0000 mg | ORAL_TABLET | Freq: Three times a day (TID) | ORAL | 0 refills | Status: DC | PRN
Start: 2020-07-10 — End: 2020-08-01

## 2020-07-10 MED ORDER — PROCHLORPERAZINE MALEATE 10 MG PO TABS: 10.0000 mg | ORAL_TABLET | Freq: Four times a day (QID) | ORAL | 3 refills | Status: AC | PRN

## 2020-07-10 MED ORDER — AMOXICILLIN-POT CLAVULANATE 875-125 MG PO TABS: 1.0000 | ORAL_TABLET | Freq: Two times a day (BID) | ORAL | 0 refills | Status: DC

## 2020-07-10 MED ORDER — PROCHLORPERAZINE MALEATE 10 MG PO TABS: 10.0000 mg | ORAL_TABLET | Freq: Four times a day (QID) | ORAL | 3 refills | Status: DC | PRN

## 2020-07-10 NOTE — Progress Notes (Signed)
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    CANCER STAGING: Cancer Staging No matching staging information was found for the patient.  INTERVAL HISTORY:  [name], a 80 y.o. male, returns for routine follow-up of his metastatic castration sensitive prostate cancer. Keith Olson was last seen on 06/19/2020.   Today he reports feeling fair. Today he is accompanied by his son. He reports waxing and waning pain in his right hip which worsened beginning 1 week ago and radiates down into the leg and foot. He also reports nausea 5-10 minutes after eating. He is not taking any anti-nausea medication, but he denies vomiting. He has been taking 4 Diuladid tablets daily. He reports sleep disturbance due to the hip pain. He reports sinus congestion without drainage for the past 2 week which was not aided with OTC medication. He has a fentanyl patch which he changes every 3 days. He denies groin pain or history of sciatica. His son reports a history of sinus issues. He does not have a pacemaker, but he has 4 stents in his heart.  REVIEW OF SYSTEMS:  Review of Systems  Constitutional: Positive for appetite change (25%) and fatigue (25%).  HENT:         Nasal congestion  Gastrointestinal: Positive for nausea. Negative for vomiting.  Genitourinary: Positive for difficulty urinating (frequency) and dysuria.   Musculoskeletal: Positive for arthralgias (R hip 7/10).  Neurological: Positive for headaches.  Psychiatric/Behavioral: Positive for  depression. The patient is nervous/anxious.     PAST MEDICAL/SURGICAL HISTORY:  Past Medical History:  Diagnosis Date  . Agent orange exposure   . Arthritis   . Coronary artery disease    BMS to mid LAD 2001, DES to proximal LAD 2017  . Enlarged prostate    XRT 2016  . Headache    Sinus headaches   . Heart abnormality   . History of depression   . Hyperlipidemia   . Hypothyroidism   . Prostate cancer ([place]) 07/2014   hormonal therapy, external beam radiation therapy  . PTSD (post-traumatic stress disorder)    Past Surgical History:  Procedure Laterality Date  . BRONCHIAL BRUSHINGS  07/02/2019   Procedure: BRONCHIAL BRUSHINGS;  Surgeon: Rigoberto Noel, MD;  Location: [facility];  Service: Cardiopulmonary;;  . BRONCHIAL NEEDLE ASPIRATION BIOPSY  07/02/2019   Procedure: BRONCHIAL NEEDLE ASPIRATION BIOPSIES;  Surgeon: Rigoberto Noel, MD;  Location: [facility];  Service: Cardiopulmonary;;  . BRONCHIAL WASHINGS  07/02/2019   Procedure: BRONCHIAL WASHINGS;  Surgeon: Rigoberto Noel, MD;  [facility]: [facility];  Service: Cardiopulmonary;;  . CARDIAC CATHETERIZATION N/A 12/12/2015   Procedure: Left Heart Cath and Coronary Angiography;  Surgeon: Peter M Martinique, MD;  Location: [place] [facility];  Service: Cardiovascular;  Laterality: N/A;  . CARDIAC CATHETERIZATION N/A 12/12/2015   Procedure: Coronary Stent Intervention;  Surgeon: Peter M Martinique, MD;  Location: [place] [facility];  Service: Cardiovascular;  Laterality: N/A;  . CORONARY STENT INTERVENTION N/A 09/06/2017   Procedure: CORONARY STENT INTERVENTION;  Surgeon: Burnell Blanks, MD;  Location: [place] [facility];  Service: Cardiovascular;  Laterality: N/A;  . ENDOBRONCHIAL ULTRASOUND N/A 07/02/2019   Procedure: ENDOBRONCHIAL ULTRASOUND;  Surgeon: Rigoberto Noel, MD;  [facility]: [facility];  Service: Cardiopulmonary;  Laterality: N/A;  . FLEXIBLE BRONCHOSCOPY  07/02/2019   Procedure: FLEXIBLE BRONCHOSCOPY;  Surgeon: Rigoberto Noel, MD;  [facility];  Service: Cardiopulmonary;;  . HERNIA REPAIR Right   . LEFT HEART CATH AND CORONARY ANGIOGRAPHY N/A 09/02/2016   Procedure: Left Heart Cath and Coronary Angiography;  Surgeon: Leonie Man, MD;  Location: [place] [facility];  Service: Cardiovascular;  Laterality: N/A;  . LEFT HEART CATH AND CORONARY ANGIOGRAPHY N/A 09/06/2017   Procedure: LEFT HEART CATH AND CORONARY ANGIOGRAPHY;  Surgeon: Burnell Blanks, MD;  Location: [place] [facility];  Service: Cardiovascular;  Laterality: N/A;  . LEFT HEART CATH AND CORONARY ANGIOGRAPHY N/A 05/04/2019   Procedure: LEFT HEART CATH AND CORONARY ANGIOGRAPHY;  Surgeon: Martinique, Peter M, MD;  Location: [place] [facility];  Service: Cardiovascular;  Laterality: N/A;  . PROSTATE BIOPSY N/A 08/28/2014   Procedure: BIOPSY TRANSRECTAL ULTRASONIC PROSTATE (TUBP);  Surgeon: Rana Snare, MD;  [facility];  Service: Urology;  Laterality: N/A;  . TEE WITHOUT CARDIOVERSION N/A 04/08/2020   Procedure: TRANSESOPHAGEAL ECHOCARDIOGRAM (TEE) WITH PROPOFOL;  Surgeon: Arnoldo Lenis, MD;  [facility];  Service: Endoscopy;  Laterality: N/A;  . TRANSURETHRAL RESECTION OF PROSTATE N/A 08/28/2014   Procedure: TRANSURETHRAL RESECTION OF THE PROSTATE WITH GYRUS INSTRUMENTS;  Surgeon: Rana Snare, MD;  Location: [facility];  Service: Urology;  Laterality: N/A;    SOCIAL HISTORY:  Social History   Socioeconomic History  . Marital status: Married    Spouse name: Not on file  . Number of children: 5  . Years of education: Not on file  . Highest education level: Not on file  Occupational History  . Occupation: retired  Tobacco Use  . Smoking status: Never Smoker  . Smokeless tobacco: Former Systems developer    Types: Secondary school teacher  . Vaping Use: Never used  Substance and Sexual Activity  . Alcohol use: No  . Drug use: No  . Sexual activity: Yes  Other Topics Concern  . Not on file  Social History Narrative   Married   5  children   Social Determinants of Health   Financial Resource Strain: Low Risk   . Difficulty of Paying Living Expenses: Not hard at all  Food Insecurity: No Food Insecurity  . Worried About Charity fundraiser in the Last Year: Never true  . Ran Out of Food in the Last Year: Never true  Transportation Needs: No Transportation Needs  . Lack of Transportation (Medical): No  . Lack of Transportation (Non-Medical): No  Physical Activity: Inactive  . Days of Exercise per Week: 0 days  . Minutes of Exercise per Session: 0 min  Stress: No Stress Concern Present  . Feeling of Stress : Not at all  Social Connections: Moderately Isolated  . Frequency of Communication with Friends and Family: More than three times a week  . Frequency of Social Gatherings with Friends and Family: More than three times a week  . Attends Religious Services: Never  . Active Member of Clubs or Organizations: No  . Attends Archivist Meetings: Never  . Marital Status: Married  Human resources officer Violence: Not At Risk  . Fear of Current or Ex-Partner: No  . Emotionally Abused: No  . Physically  Abused: No  . Sexually Abused: No    FAMILY HISTORY:  Family History  Problem Relation Age of Onset  . Arthritis Mother   . Heart attack Father     CURRENT MEDICATIONS:  Current Outpatient Medications  Medication Sig Dispense Refill  . abiraterone acetate (ZYTIGA) 250 MG tablet TAKE 4 TABLETS (1,000 MG TOTAL) BY MOUTH DAILY. TAKE ON AN EMPTY STOMACH 1 HOUR BEFORE OR 2 HOURS AFTER A MEAL 120 tablet 6  . ALPRAZolam (XANAX) 0.5 MG tablet Take 1 tablet (0.5 mg total) by mouth 2 (two) times daily as needed for anxiety. 10 tablet 0  . aspirin EC 81 MG tablet Take 1 tablet (81 mg total) by mouth daily.    Marland Kitchen ceFAZolin (ANCEF) 10 g injection     . cetirizine (ZYRTEC) 10 MG tablet Take 10 mg by mouth daily.    . diclofenac sodium (VOLTAREN) 1 % GEL APPLY 2 GRAMS TOPICALLY 4 TIMES DAILY (Patient taking differently:  Apply 2 g topically 4 (four) times daily.) 100 g 5  . dronabinol (MARINOL) 2.5 MG capsule Take 1 capsule (2.5 mg total) by mouth 2 (two) times daily before a meal. 60 capsule 2  . DULoxetine (CYMBALTA) 60 MG capsule Take 60 mg by mouth daily.    . fentaNYL (DURAGESIC) 25 MCG/HR Place 1 patch onto the skin every 3 (three) days. 10 patch 0  . fluticasone (FLONASE) 50 MCG/ACT nasal spray Place 2 sprays into both nostrils daily.    Marland Kitchen gabapentin (NEURONTIN) 400 MG capsule TAKE 1 CAPSULE BY MOUTH THREE TIMES DAILY 90 capsule 3  . HYDROmorphone (DILAUDID) 4 MG tablet Take 1 tablet (4 mg total) by mouth every 8 (eight) hours as needed for severe pain. 90 tablet 0  . lactulose (CHRONULAC) 10 GM/15ML solution Take 30 mLs (20 g total) by mouth every 3 (three) hours as needed for mild constipation (take 56m po every 3 hrs until BM). 480 mL 0  . linaclotide (LINZESS) 145 MCG CAPS capsule 1 capsule at least 30 minutes before the first meal of the day on an empty stomach    . lisinopril (PRINIVIL) 10 MG tablet Take 1 tablet (10 mg total) by mouth daily. 30 tablet 6  . NARCAN 4 MG/0.1ML LIQD nasal spray kit Place 1 spray into the nose once.    . nitroGLYCERIN (NITROSTAT) 0.4 MG SL tablet Place 1 tablet (0.4 mg total) under the tongue every 5 (five) minutes as needed for chest pain. X 3 doses 25 tablet 12  . pantoprazole (PROTONIX) 40 MG tablet Take 1 tablet (40 mg total) by mouth daily. 30 tablet 3  . polyethylene glycol powder (GLYCOLAX/MIRALAX) 17 GM/SCOOP powder Take 17 g by mouth daily as needed for mild constipation or moderate constipation.     . predniSONE (DELTASONE) 5 MG tablet TAKE 1 TABLET (5 MG TOTAL) BY MOUTH DAILY WITH BREAKFAST. 30 tablet 6  . tamsulosin (FLOMAX) 0.4 MG CAPS capsule Take 0.4 mg by mouth daily.     No current facility-administered medications for this visit.    ALLERGIES:  Allergies  Allergen Reactions  . Lipitor [Atorvastatin]     Muscles aches    PHYSICAL EXAM:   Performance status (ECOG): 1 - Symptomatic but completely ambulatory  Vitals:   07/10/20 1013  BP: (!) 149/70  Pulse: 67  Resp: 19  Temp: (!) 97.4 F (36.3 C)  SpO2: 99%   Wt Readings from Last 3 Encounters:  07/10/20 167 lb 8 oz (76 kg)  06/19/20 162 lb 14.4 oz (73.9 kg)  06/19/20 162 lb 9.6 oz (73.8 kg)   Physical Exam Vitals reviewed.  Constitutional:      Appearance: Normal appearance.  Cardiovascular:     Rate and Rhythm: Normal rate and regular rhythm.     Pulses: Normal pulses.     Heart sounds: Normal heart sounds.  Pulmonary:     Effort: Pulmonary effort is normal.     Breath sounds: Normal breath sounds.  Musculoskeletal:     Right lower leg: No edema.     Left lower leg: No edema.  Neurological:     General: No focal deficit present.     Mental Status: He is alert and oriented to person, place, and time.  Psychiatric:        Mood and Affect: Mood normal.        Behavior: Behavior normal.      LABORATORY DATA:  I have reviewed the labs as listed.  CBC Latest Ref Rng & Units 07/10/2020 06/19/2020 05/23/2020  WBC 4.0 - 10.5 K/uL 7.0 7.7 7.0  Hemoglobin 13.0 - 17.0 g/dL 12.9(L) 13.1 13.9  Hematocrit 39.0 - 52.0 % 38.1(L) 40.7 44.1  Platelets 150 - 400 K/uL 239 262 270   CMP Latest Ref Rng & Units 07/10/2020 06/19/2020 05/23/2020  Glucose 70 - 99 mg/dL 108(H) 89 125(H)  BUN 8 - 23 mg/dL _0 Creatinine 0.61 - 1.24 mg/dL 0.55(L) 0.62 0.68  Sodium 135 - 145 mmol/L 137 138 137  Potassium 3.5 - 5.1 mmol/L 3.4(L) 4.5 4.5  Chloride 98 - 111 mmol/L 104 103 100  CO2 22 - 32 mmol/L _1 Calcium 8.9 - 10.3 mg/dL 8.8(L) 9.4 9.3  Total Protein 6.5 - 8.1 g/dL 6.9 7.1 7.3  Total Bilirubin 0.3 - 1.2 mg/dL 0.5 0.5 0.5  Alkaline Phos 38 - 126 U/L 48 62 55  AST 15 - 41 U/L _2 ALT 0 - 44 U/L _3 DIAGNOSTIC IMAGING:  I have independently reviewed the scans and discussed with the patient. No results found.   ASSESSMENT:  1. Metastatic  castration sensitive prostate cancer: -History of prostate cancer diagnosed 08/28/2014, Gleason 4+4=8, status post XRT and seed implants. -Presentation with low back pain of several months, PSA on 06/19/2019 was 239, testosterone 210, alkaline phosphatase 306. -MRI of the cervical, thoracic and lumbar spine on 07/27/2019 shows diffuse bone metastatic disease. Mild to moderate compression deformity of T3 with mild ventral epidural extension. No cord compression. 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 -CT angio of the chest, abdomen and pelvis on 06/27/2019 shows multiple lung nodules bilaterally, largest in the left upper lobe measuring 2.5 x 1.6 x 1.5 cm with associated left hilar and AP window lymphadenopathy. -Navigational bronchoscopy and biopsy on 07/02/2019 with upper lobe brushing and washing shows atypical cells. 10 L FNA also shows atypical cells. -1 pack/day for 4-5 years, quit 60 years ago. Positive agent orange exposure -Lupron 45 mg on 07/12/2019 along with 1 month ofCasodex. -PET scan on 08/11/2019 shows mildly hypermetabolic widespread sclerotic osseous metastasis throughout the axial and proximal appendicular skeleton, increased sclerosis since 06/27/2018 on CT angiogram. This could represent response to therapy. No hypermetabolic lymphadenopathy. Left mediastinal and left hilar adenopathy is seen on 06/19/2019 has resolved. Bilateral pulmonary nodules are stable to decreased in size. Dominant left upper lobe lung nodule has  decreased from 2.5 cm to 1.7 cm. -Abiraterone was started around 08/29/2019. -PET scan was done on 03/03/2020 as he complained of joint pains. New mildly hypermetabolic mildly enlarged right subcarinal lymph node measuring 1 cm, SUV 4.1. No other lymphadenopathy. Widespread sclerotic lesions throughout the axial and proximal appendicular skeleton, appearing stable in  distribution and increase in sclerosis.  2. Low back pain: -10 treatments of radiation therapy completed on 07/11/2019.   PLAN:  1. Metastatic castration sensitive prostate cancer: -Continue Abiraterone 4 tablets on empty stomach along with prednisone 5 mg daily. - Reviewed labs today which showed normal CBC.  LFTs were normal.  Creatinine was normal. - Last PSA was less than 0.01. - Reported some sinus congestion with yellowish discharge which is not relieved by over-the-counter medication. - I will give prescription for Augmentin 875 mg twice daily. - RTC after the MRI.  2.  Right hip pain: -Continue fentanyl 25 mcg patch. - He reports taking Dilaudid 4 mg up to 4 tablets/day.  He has only 6 pills left. - We will give a refill for Dilaudid every 8 hours #90. - He reports pain towards the right side of the lower back which is radiating down his right foot.  I have recommended MRI of the lumbar spine and MRI of the right hip with and without contrast.  3.Loss of appetite: -Continue Marinol 2.5 mg twice daily. - Continue ensures 3 cans/day.  4. Constipation: -Continue stool softener and MiraLAX.  5. Bone mets: -Continue calcium and vitamin D supplements.  Calcium is normal.  Continue denosumab monthly.  6. Hypertension: -Continue lisinopril 10 mg daily.  Blood pressure today is better controlled.  7. Hot flashes: -He is not requiring on a daily basis.  Use as needed.  8.  High risk drug monitoring: - His blood pressure is stable.  Potassium is normal.  LFTs are normal.   Orders placed this encounter:  No orders of the defined types were placed in this encounter.    Derek Jack, MD [place] [phone]   I, Thana Ates, am acting as a scribe for Dr. Sanda Linger.  I, Derek Jack MD, have reviewed the above documentation for accuracy and completeness, and I agree with the above.

## 2020-07-10 NOTE — Patient Instructions (Addendum)
[place] at [facility] Discharge Instructions  You were seen today by Dr. Delton Coombes. He went over your recent results. You will be scheduled for an MRI of your lumbar spine and hip. You will be prescribed Compazine to take 30 minutes prior to eating to help with nausea, and Augmentin to help with sinus congestion. Dr. Delton Coombes will see you back after the results of your MRI for follow up.   Thank you for choosing [place] at [facility] to provide your oncology and hematology care.  To afford each patient quality time with our provider, please arrive at least 15 minutes before your scheduled appointment time.   If you have a lab appointment with the [place] please come in thru the Main Entrance and check in at the main information desk  You need to re-schedule your appointment should you arrive 10 or more minutes late.  We strive to give you quality time with our providers, and arriving late affects you and other patients whose appointments are after yours.  Also, if you no show three or more times for appointments you may be dismissed from the clinic at the providers discretion.     Again, thank you for choosing [facility].  Our hope is that these requests will decrease the amount of time that you wait before being seen by our physicians.       _____________________________________________________________  Should you have questions after your visit to [facility], please contact our office at [phone] between the hours of 8:00 a.m. and 4:30 p.m.  Voicemails left after 4:00 p.m. will not be returned until the following business day.  For prescription refill requests, have your pharmacy contact our office and allow 72 hours.    Cancer Center Support Programs:   > Cancer Support Group  2nd Tuesday of the month 1pm-2pm, Journey Room

## 2020-07-14 ENCOUNTER — Other Ambulatory Visit (HOSPITAL_COMMUNITY): Payer: Self-pay

## 2020-07-14 DIAGNOSIS — J329 Chronic sinusitis, unspecified: Secondary | ICD-10-CM | POA: Diagnosis not present

## 2020-07-15 ENCOUNTER — Telehealth (HOSPITAL_COMMUNITY): Payer: Self-pay | Admitting: Surgery

## 2020-07-15 ENCOUNTER — Other Ambulatory Visit (HOSPITAL_COMMUNITY): Payer: Self-pay | Admitting: Surgery

## 2020-07-15 NOTE — Telephone Encounter (Signed)
Pt had called this morning stating that he had not been able to pick up his pain medication from the pharmacy.  I told the pt that the prescription had been sent on 07/10/20. I called the pharmacy to see why the pt had not been able to pick up the medication yet, and the pharmacy stated that based on his insurance he cannot pick up the medication until 07/17/20.  I called the pt back to let him know this, and to see if he was still using his fentanyl patch and gabapentin.  He stated that he was still taking both of those medications, and that he had been taking goody's powder at times for pain too.  When I asked him where the pain was, he stated that the pain went from his right hip down his leg.  I told him that I would notify Dr. Delton Coombes of the situation.  Per Dr. Delton Coombes, the pt does not need to exceed 1-2 doses of the goody's powder in a day.  Also, he stated that he cannot order any more pain medicine for the pt.  I called the pt back and he verbalized understanding of these instructions, and I told him to call our office back if he had any more concerns.

## 2020-07-17 ENCOUNTER — Inpatient Hospital Stay (HOSPITAL_COMMUNITY): Payer: Medicare Other

## 2020-07-17 ENCOUNTER — Other Ambulatory Visit: Payer: Self-pay

## 2020-07-17 ENCOUNTER — Inpatient Hospital Stay (HOSPITAL_COMMUNITY): Payer: Medicare Other | Admitting: Dietician

## 2020-07-17 VITALS — BP 155/81 | HR 75 | Temp 97.0°F | Resp 18

## 2020-07-17 DIAGNOSIS — C7951 Secondary malignant neoplasm of bone: Secondary | ICD-10-CM | POA: Diagnosis not present

## 2020-07-17 DIAGNOSIS — I10 Essential (primary) hypertension: Secondary | ICD-10-CM | POA: Diagnosis not present

## 2020-07-17 DIAGNOSIS — K5900 Constipation, unspecified: Secondary | ICD-10-CM | POA: Diagnosis not present

## 2020-07-17 DIAGNOSIS — C61 Malignant neoplasm of prostate: Secondary | ICD-10-CM

## 2020-07-17 DIAGNOSIS — Z5111 Encounter for antineoplastic chemotherapy: Secondary | ICD-10-CM | POA: Diagnosis not present

## 2020-07-17 LAB — COMPREHENSIVE METABOLIC PANEL
ALT: 28 U/L (ref 0–44)
AST: 24 U/L (ref 15–41)
Albumin: 4.1 g/dL (ref 3.5–5.0)
Alkaline Phosphatase: 54 U/L (ref 38–126)
Anion gap: 9 (ref 5–15)
BUN: 15 mg/dL (ref 8–23)
CO2: 24 mmol/L (ref 22–32)
Calcium: 9.1 mg/dL (ref 8.9–10.3)
Chloride: 102 mmol/L (ref 98–111)
Creatinine, Ser: 0.55 mg/dL — ABNORMAL LOW (ref 0.61–1.24)
GFR, Estimated: >60 mL/min (ref >60)
Glucose, Bld: 113 mg/dL — ABNORMAL HIGH (ref 70–99)
Potassium: 4 mmol/L (ref 3.5–5.1)
Sodium: 135 mmol/L (ref 135–145)
Total Bilirubin: 0.5 mg/dL (ref 0.3–1.2)
Total Protein: 7 g/dL (ref 6.5–8.1)

## 2020-07-17 MED ORDER — LEUPROLIDE ACETATE (6 MONTH) 45 MG ~~LOC~~ KIT
45.0000 mg | PACK | Freq: Once | SUBCUTANEOUS | Status: AC
  Administered 2020-07-17: 45 mg via SUBCUTANEOUS
  Filled 2020-07-17: qty 45

## 2020-07-17 MED ORDER — DENOSUMAB 120 MG/1.7ML ~~LOC~~ SOLN
120.0000 mg | Freq: Once | SUBCUTANEOUS | Status: AC
  Administered 2020-07-17: 120 mg via SUBCUTANEOUS

## 2020-07-17 NOTE — Patient Instructions (Signed)
[place]  Discharge Instructions: Thank you for choosing [place] to provide your oncology and hematology care.  If you have a lab appointment with the [place], please come in thru the Main Entrance and check in at the main information desk.  Wear comfortable clothing and clothing appropriate for easy access to any Portacath or PICC line.   We strive to give you quality time with your provider. You may need to reschedule your appointment if you arrive late (15 or more minutes).  Arriving late affects you and other patients whose appointments are after yours.  Also, if you miss three or more appointments without notifying the office, you may be dismissed from the clinic at the provider's discretion.      For prescription refill requests, have your pharmacy contact our office and allow 72 hours for refills to be completed.    Today you received the following chemotherapy and/or immunotherapy agents Eligard and Xgeva   To help prevent nausea and vomiting after your treatment, we encourage you to take your nausea medication as directed.  BELOW ARE SYMPTOMS THAT SHOULD BE REPORTED IMMEDIATELY: . *FEVER GREATER THAN 100.4 F (38 C) OR HIGHER . *CHILLS OR SWEATING . *NAUSEA AND VOMITING THAT IS NOT CONTROLLED WITH YOUR NAUSEA MEDICATION . *UNUSUAL SHORTNESS OF BREATH . *UNUSUAL BRUISING OR BLEEDING . *URINARY PROBLEMS (pain or burning when urinating, or frequent urination) . *BOWEL PROBLEMS (unusual diarrhea, constipation, pain near the anus) . TENDERNESS IN MOUTH AND THROAT WITH OR WITHOUT PRESENCE OF ULCERS (sore throat, sores in mouth, or a toothache) . UNUSUAL RASH, SWELLING OR PAIN  . UNUSUAL VAGINAL DISCHARGE OR ITCHING   Items with * indicate a potential emergency and should be followed up as soon as possible or go to the Emergency Department if any problems should occur.  Please show the CHEMOTHERAPY ALERT CARD or IMMUNOTHERAPY ALERT CARD at  check-in to the Emergency Department and triage nurse.  Should you have questions after your visit or need to cancel or reschedule your appointment, please contact [facility] [phone]  and follow the prompts.  Office hours are 8:00 a.m. to 4:30 p.m. Monday - Friday. Please note that voicemails left after 4:00 p.m. may not be returned until the following business day.  We are closed weekends and major holidays. You have access to a nurse at all times for urgent questions. Please call the main number to the clinic [phone] and follow the prompts.  For any non-urgent questions, you may also contact your provider using MyChart. We now offer e-Visits for anyone 52 and older to request care online for non-urgent symptoms. For details visit [email or web address].   Also download the MyChart app! Go to the app store, search "MyChart", open the app, select [place], and log in with your MyChart username and password.  Due to Covid, a mask is required upon entering the hospital/clinic. If you do not have a mask, one will be given to you upon arrival. For doctor visits, patients may have 1 support person aged 79 or older with them. For treatment visits, patients cannot have anyone with them due to current Covid guidelines and our immunocompromised population.

## 2020-07-17 NOTE — Progress Notes (Signed)
Nutrition Follow-up:  Patient with prostate cancer metastatic to bone. He is receiving Lupron and Zytiga.   Met with patient in clinic today. He reports is appetite has "fallen off" and is not eating much. Patient reports right hip pain that is worsening. He is drinking 2-3 Ensure and "tries" to drink a lot of water. Yesterday he ate an egg sandwich with 2 eggs and mayo for breakfast, bowl of pork and beans for lunch, had 2 boiled eggs for a snack and did not eat dinner. Patient thinks he had 2 Ensure. He has appetite stimulant, but he reports he is not taking this. Patient reports its sitting in the refrigerator, he will start taking today. Patient would like another case of Ensure today. He came to appointment alone, but will have his son get it out of the car for him later.   Medications: Marinol, Compazine  Labs: Glucose 113, Cr 0.55  Anthropometrics: Weight 167 lb 8 oz today increased 5 lbs in the last 3 weeks  4/21 - 162 lb 14.4 oz 3/25 - 158 lb 8.4 oz 2/24 - 161 lb 3.2 oz  NUTRITION DIAGNOSIS: Inadequate oral intake ongoing   INTERVENTION:  Discussed eating small meals and snacks frequently Patient will start taking appetite stimulant Continue strategies for adding calories and protein at meal times (mayo to sandwiches, cooking with butter, adding milk to oatmeal, topping foods with cheese) Encouraged drinking 3 Ensure Plus daily Complimentary Ensure Plus case taken to valet attendant to put in patients care after appointment today Patient has contact information  MONITORING, EVALUATION, GOAL: weight trends, intake   NEXT VISIT: Thursday June 16

## 2020-07-18 ENCOUNTER — Telehealth (HOSPITAL_COMMUNITY): Payer: Self-pay

## 2020-07-18 NOTE — Telephone Encounter (Signed)
Patient called stating he has been having problems sleeping for the past two nights and feels like the injections he received yesterday made it worse last night.  Reviewed side effects of medications with the patient and instructed to call his PCP with understanding verbalized.

## 2020-07-23 ENCOUNTER — Other Ambulatory Visit: Payer: Self-pay

## 2020-07-23 ENCOUNTER — Ambulatory Visit (HOSPITAL_COMMUNITY)
Admission: RE | Admit: 2020-07-23 | Discharge: 2020-07-23 | Disposition: A | Discharge status: Home / Self Care | Payer: Medicare Other | Source: Ambulatory Visit | Attending: Hematology | Admitting: Hematology

## 2020-07-23 DIAGNOSIS — M25551 Pain in right hip: Secondary | ICD-10-CM | POA: Diagnosis not present

## 2020-07-23 DIAGNOSIS — C7951 Secondary malignant neoplasm of bone: Secondary | ICD-10-CM

## 2020-07-23 DIAGNOSIS — M8448XA Pathological fracture, other site, initial encounter for fracture: Secondary | ICD-10-CM | POA: Diagnosis not present

## 2020-07-23 DIAGNOSIS — C61 Malignant neoplasm of prostate: Secondary | ICD-10-CM

## 2020-07-23 DIAGNOSIS — S76311A Strain of muscle, fascia and tendon of the posterior muscle group at thigh level, right thigh, initial encounter: Secondary | ICD-10-CM | POA: Diagnosis not present

## 2020-07-23 DIAGNOSIS — M545 Low back pain, unspecified: Secondary | ICD-10-CM | POA: Diagnosis not present

## 2020-07-23 IMAGING — MR MR HIP*R* WO/W CM
6 of 7 series · 39 of 48 positions shown · IV contrast (gadavist)
Comparison: CT [DATE]

CLINICAL DATA: Right hip pain.  Metastatic prostate cancer.

EXAM:
MRI OF THE RIGHT HIP WITHOUT AND WITH CONTRAST
TECHNIQUE: Multiplanar, multisequence MR imaging was performed both before and
after administration of intravenous contrast.
CONTRAST:  7.5mL GADAVIST GADOBUTROL 1 MMOL/ML IV SOLN

[Series 1: T1 · coronal · 4.0mm · 0.74mm/px · 7 of 30 slices shown]
[im 1/30]
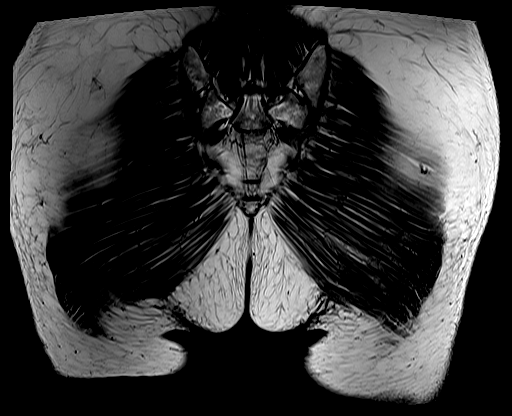
[im 5/30]
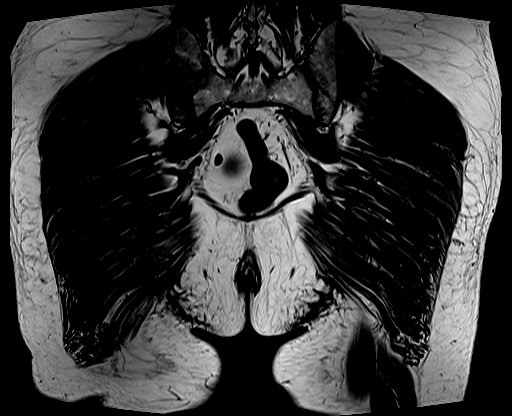
[im 10/30]
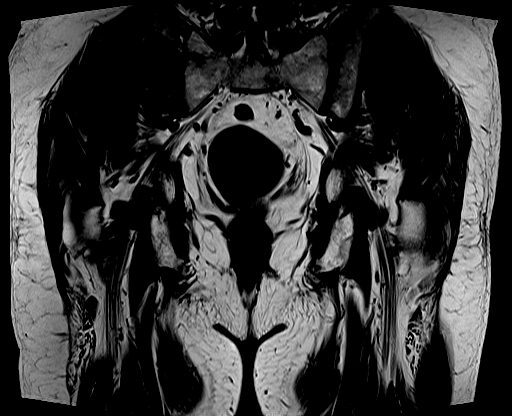
[im 15/30]
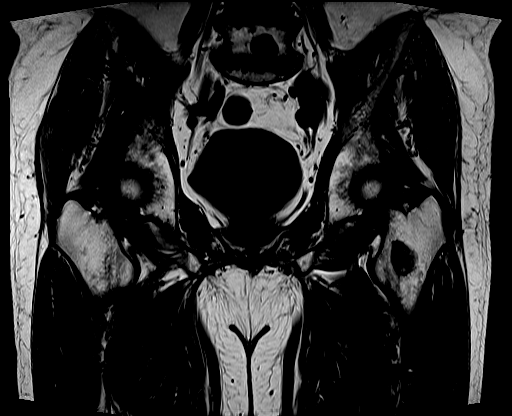
[im 20/30]
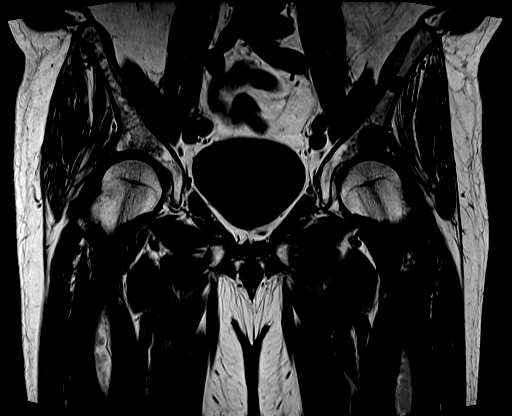
[im 25/30]
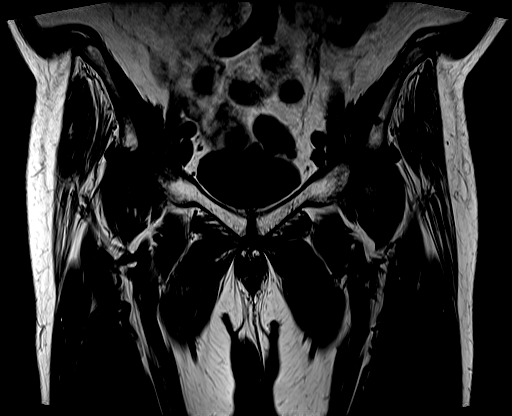
[im 30/30]
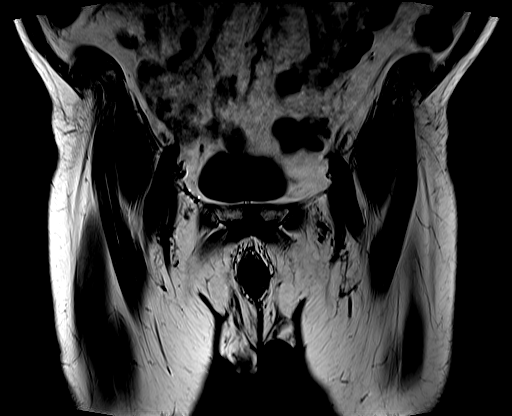

[Series 3: STIR · coronal · 4.0mm · 0.99mm/px · 5 of 30 slices shown]
[im 1/30]
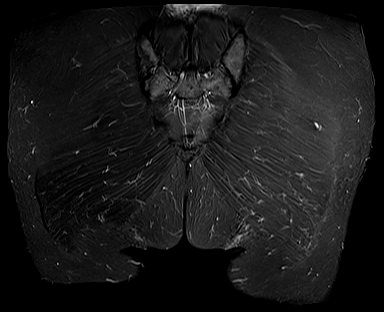
[im 5/30]
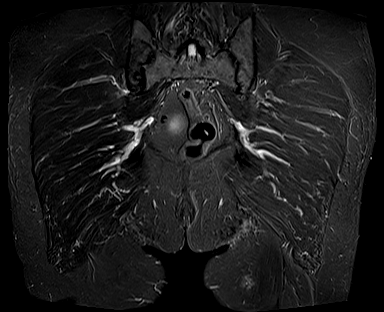
[im 10/30]
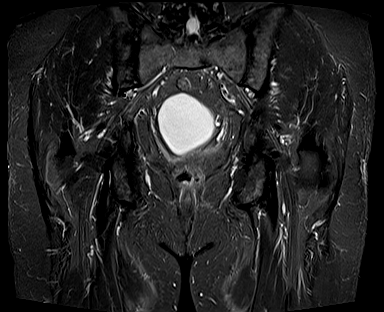
[im 15/30]
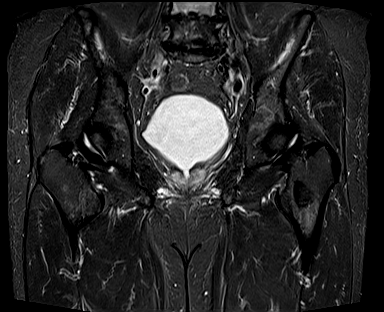
[im 20/30]
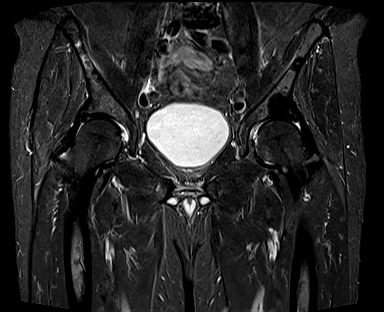

[Series 5: T1 fat-sat · axial · non-contrast · 4.0mm · 0.87mm/px · z∈[-35,+100]mm · 7 of 28 slices shown]
[im 1/28]
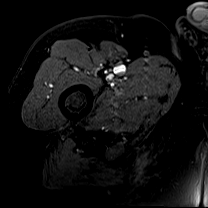
[im 5/28]
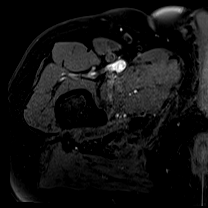
[im 10/28]
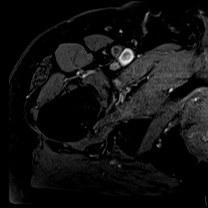
[im 14/28]
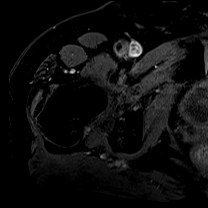
[im 19/28]
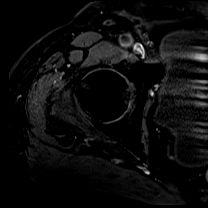
[im 23/28]
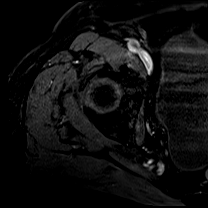
[im 28/28]
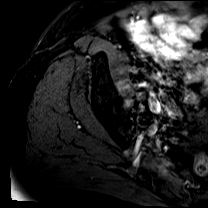

[Series 6: T1 fat-sat post-contrast · axial · 4.0mm · 0.87mm/px · z∈[-35,+100]mm · 7 of 28 slices shown (1 of 3)]
[im 1/28]
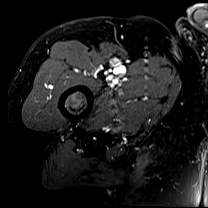
[im 5/28]
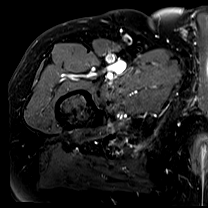
[im 10/28]
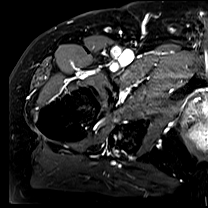
[im 14/28]
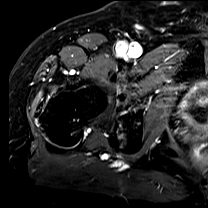
[im 19/28]
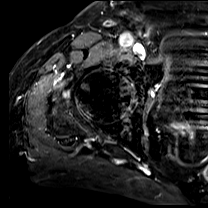
[im 23/28]
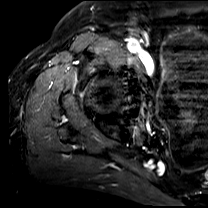
[im 28/28]
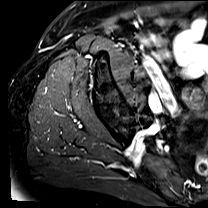

[Series 7: T1 fat-sat post-contrast · coronal · 4.0mm · 0.87mm/px · 5 of 20 slices shown (2 of 3)]
[im 1/20]
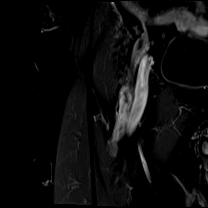
[im 5/20]
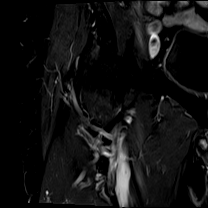
[im 10/20]
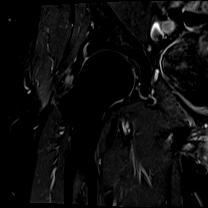
[im 15/20]
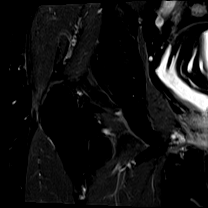
[im 20/20]
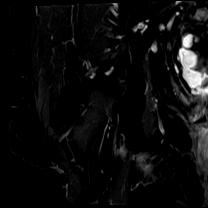

[Series 8: T1 fat-sat post-contrast · coronal · 4.0mm · 0.74mm/px · 8 of 35 slices shown (3 of 3)]
[im 1/35]
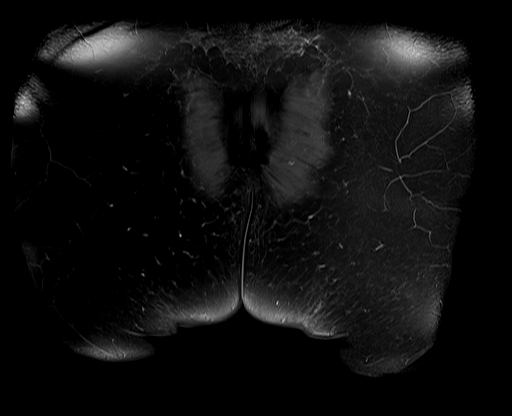
[im 5/35]
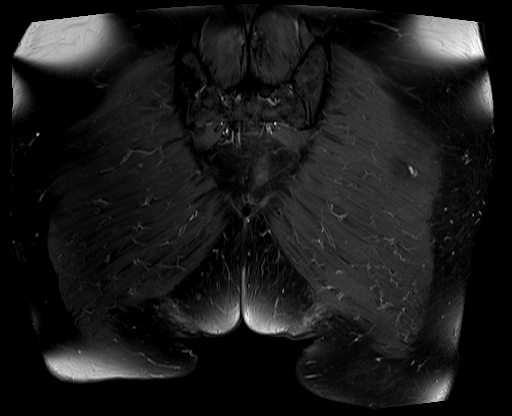
[im 10/35]
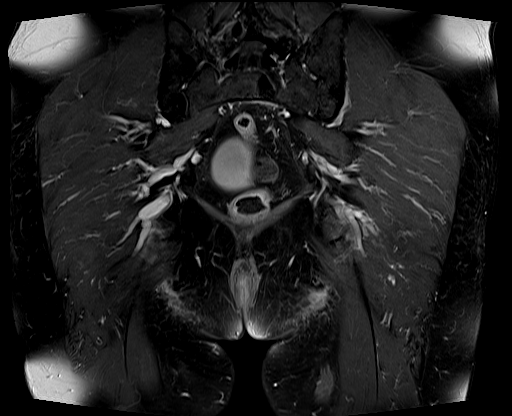
[im 15/35]
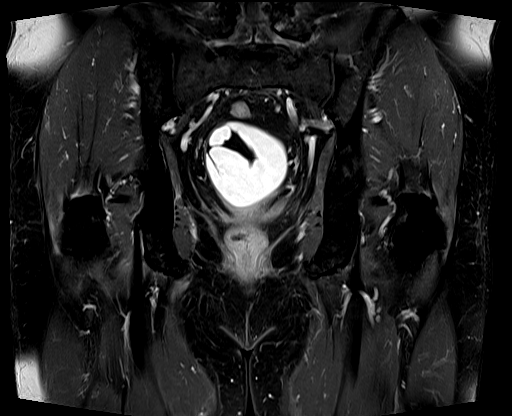
[im 20/35]
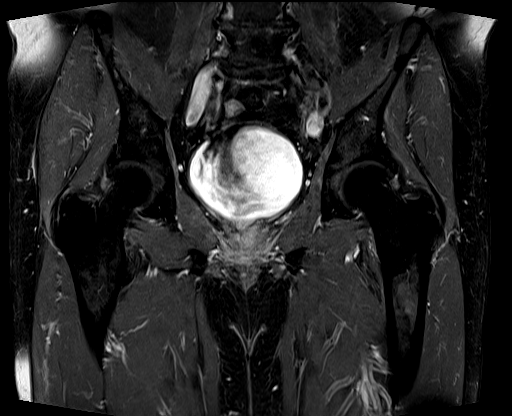
[im 25/35]
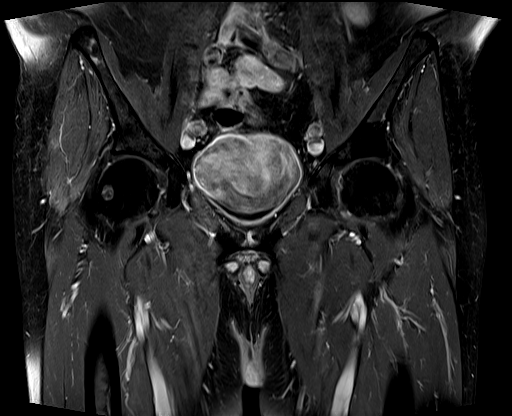
[im 30/35]
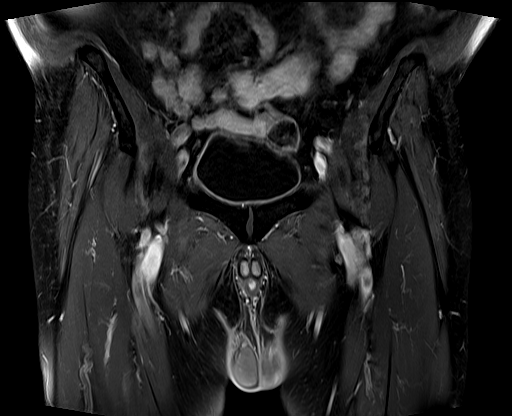
[im 35/35]
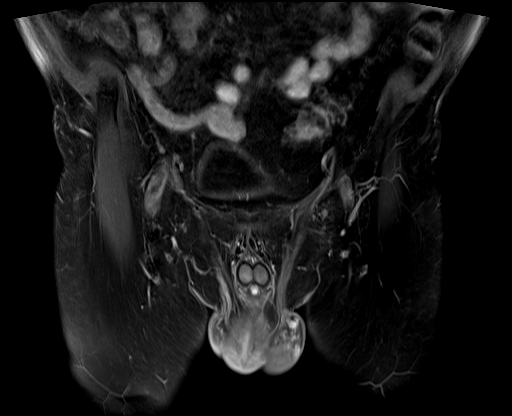

[39 of 48 positions shown; findings below may reference images not displayed]

FINDINGS: Bones: Widespread osseous metastatic disease throughout the pelvis,
visualized lower lumbar spine, and bilateral proximal femurs.
Partially visualized pathologic fracture of the L4 vertebral body,
more fully characterized on dedicated same-day MRI of the lumbar
spine. Otherwise, no evidence of acute pathologic fracture involving
the pelvis or bilateral hips.

Articular cartilage and labrum

Articular cartilage: No cartilage defect. No subchondral marrow
signal changes.

Labrum: No well-defined labral tear on non-arthrographic imaging. No
paralabral cyst.

Joint or bursal effusion

Joint effusion:  None.

Bursae: No abnormal bursal fluid collection.

Muscles and tendons

Muscles and tendons: Tendinosis with partial-thickness tear of the
right hamstring tendon origin. The gluteal, iliopsoas, rectus
femoris, and adductor tendons appear intact without tear or
significant tendinosis. Mild intramuscular edema within the deep
portion of the bilateral iliacus muscles, nonspecific but may be
reactive to adjacent iliac bone metastatic disease. Remaining
visualized musculature is within normal limits.

Other findings

Miscellaneous: No soft tissue edema or fluid collection. No inguinal
lymphadenopathy. No acute findings are seen within the pelvis.
IMPRESSION: 1. Widespread osseous metastatic disease throughout the pelvis,
visualized lower lumbar spine, and bilateral proximal femurs.
Partially visualized pathologic fracture of the L4 vertebral body,
more fully characterized on dedicated same-day MRI of the lumbar
spine. No evidence of acute pathologic fracture involving the pelvis
or bilateral hips.
2. No significant arthropathy of the right hip.  No joint effusion.
3. Tendinosis with partial-thickness tear of the right hamstring
tendon origin.

## 2020-07-23 IMAGING — MR MR LUMBAR SPINE WO/W CM
6 of 7 series · 31 of 48 positions shown · IV contrast (gadavist)
Comparison: MRI of the lumbar spine [DATE].

CLINICAL DATA: Prostate cancer metastatic to bone. Hip pain, acute,
right.

EXAM:
MRI LUMBAR SPINE WITHOUT AND WITH CONTRAST
TECHNIQUE: Multiplanar and multiecho pulse sequences of the lumbar spine were
obtained without and with intravenous contrast.
CONTRAST:  7.5mL GADAVIST GADOBUTROL 1 MMOL/ML IV SOLN

[Series 9: T1 · sagittal · 4.0mm · 0.81mm/px · 5 of 15 slices shown (1 of 2)]
[im 1/15]
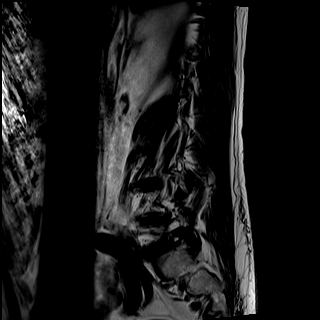
[im 4/15]
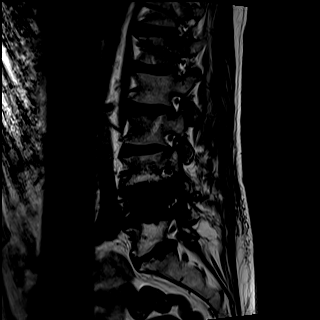
[im 8/15]
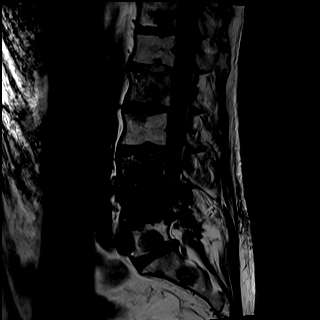
[im 11/15]
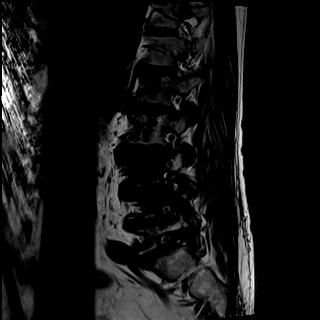
[im 15/15]
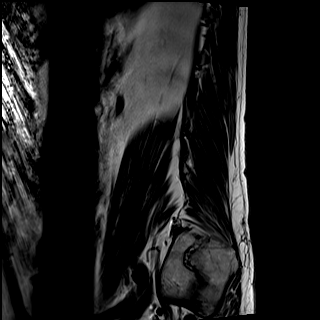

[Series 10: T2 · sagittal · 4.0mm · 0.68mm/px · 5 of 15 slices shown (1 of 2)]
[im 1/15]
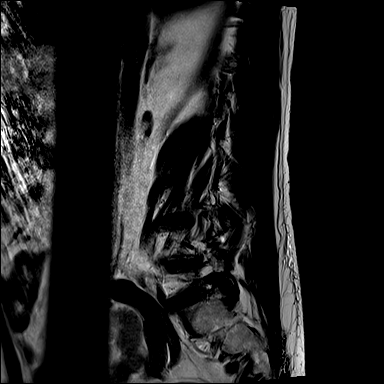
[im 4/15]
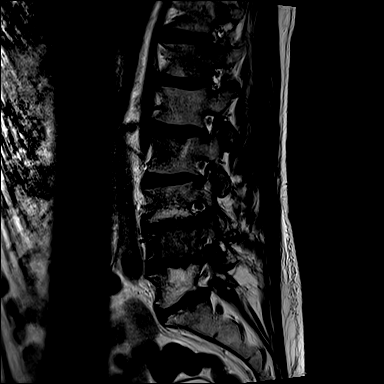
[im 8/15]
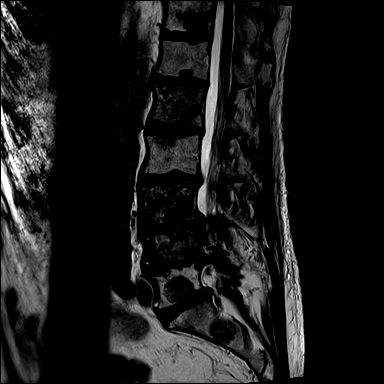
[im 11/15]
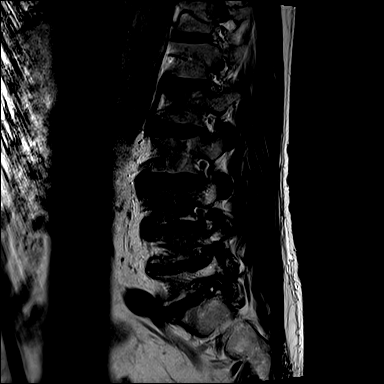
[im 15/15]
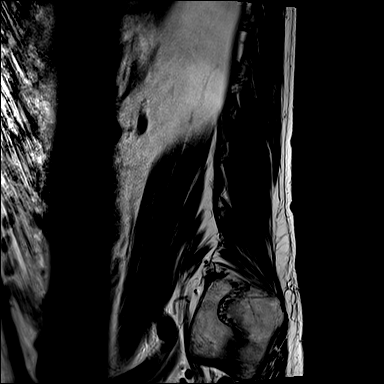

[Series 11: STIR · sagittal · 4.0mm · 0.51mm/px · 1 of 15 slices shown]
[im 1/15]
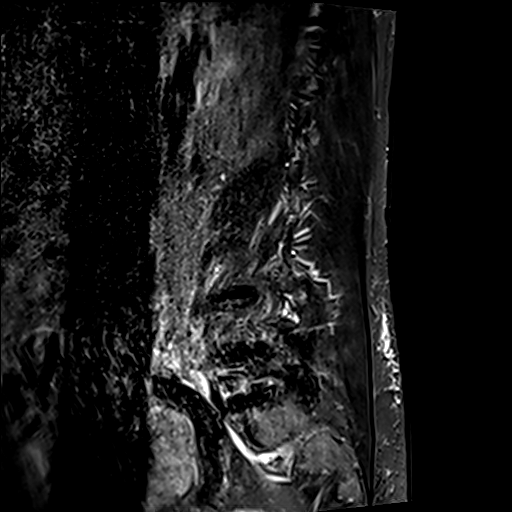

[Series 13: T1 · axial · 4.0mm · 0.35mm/px · z∈[+126,+311]mm · 8 of 38 slices shown (2 of 2)]
[im 1/38]
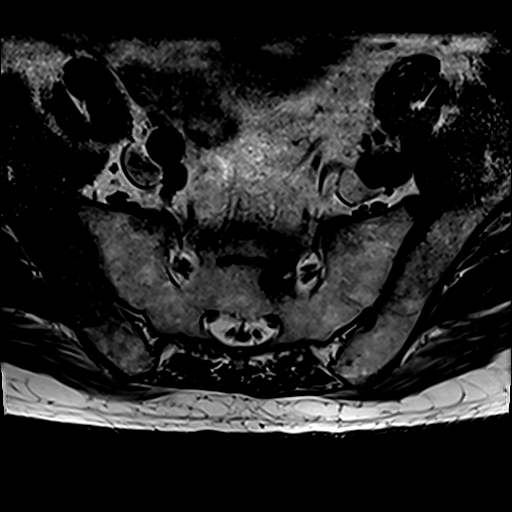
[im 5/38]
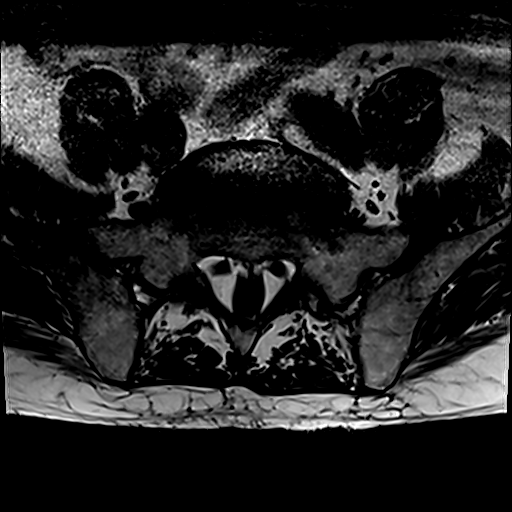
[im 13/38]
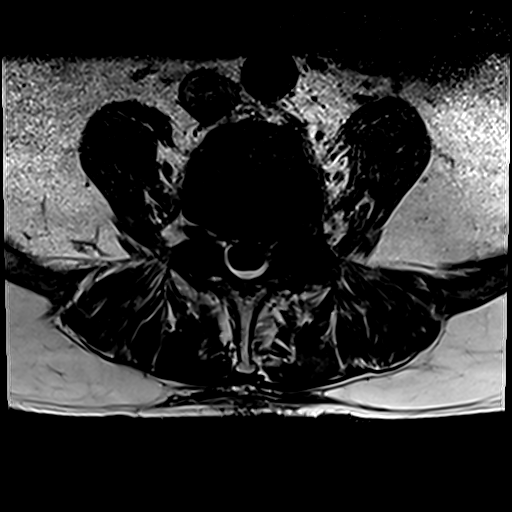
[im 17/38]
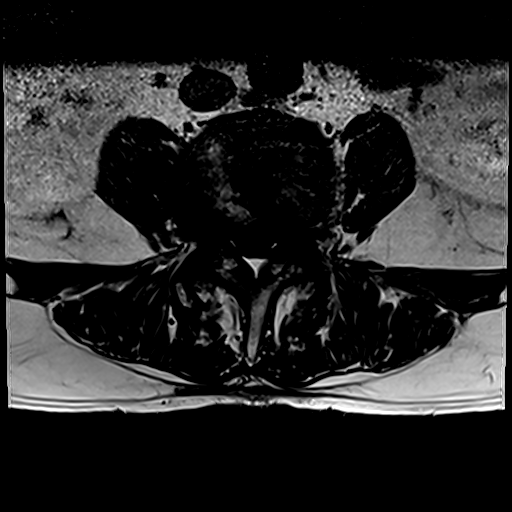
[im 21/38]
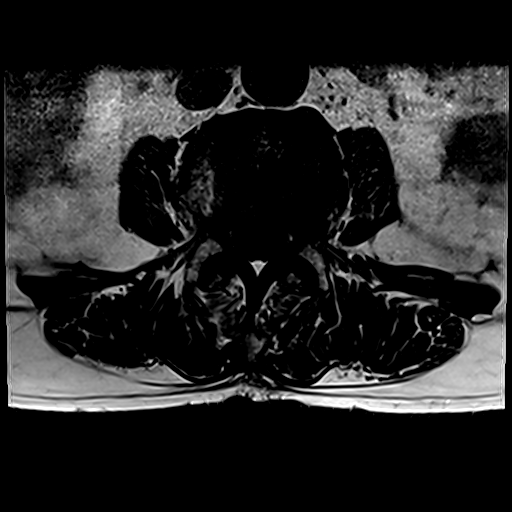
[im 25/38]
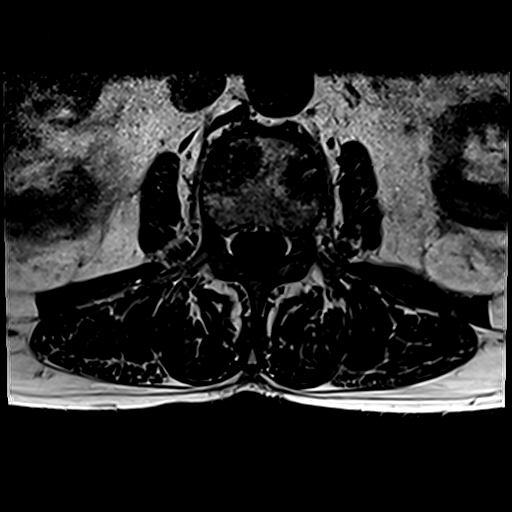
[im 33/38]
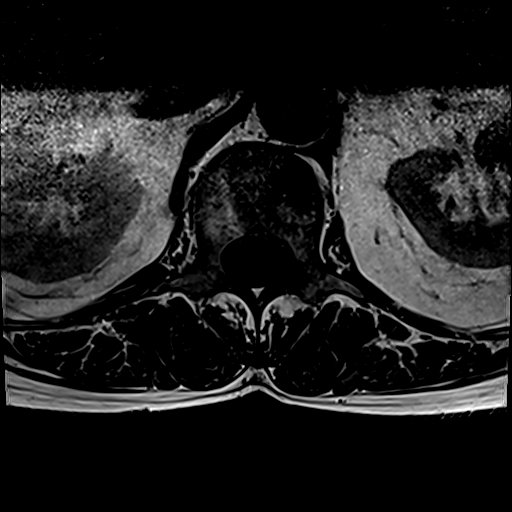
[im 38/38]
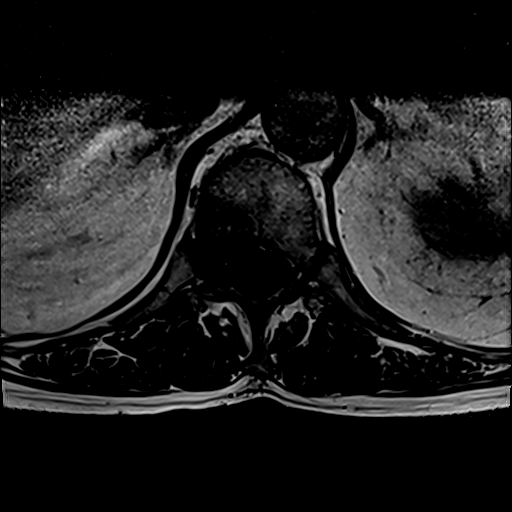

[Series 15: T1 fat-sat post-contrast · sagittal · 4.0mm · 0.81mm/px · 4 of 15 slices shown]
[im 1/15]
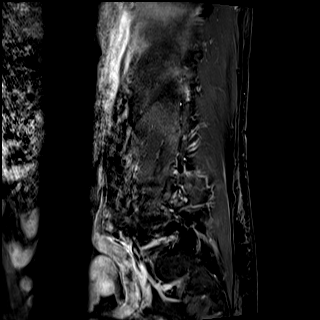
[im 5/15]
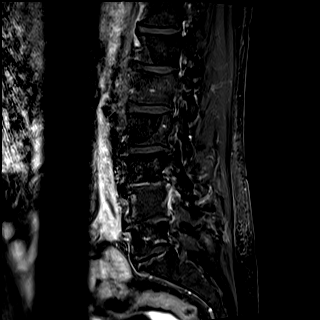
[im 10/15]
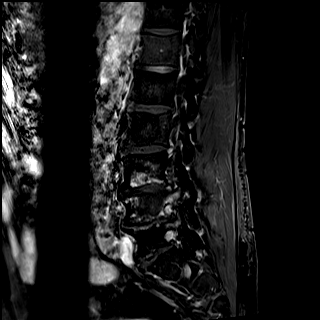
[im 15/15]
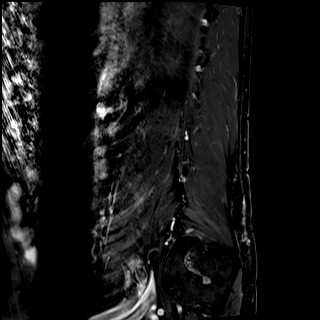

[Series 100: T2 · axial · 4.0mm · 0.70mm/px · z∈[+126,+311]mm · 8 of 38 slices shown (2 of 2)]
[im 1/38]
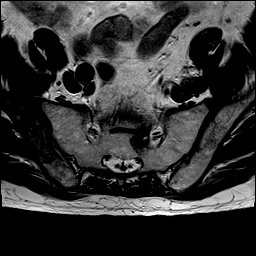
[im 5/38]
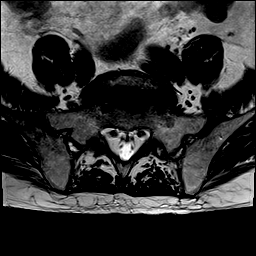
[im 13/38]
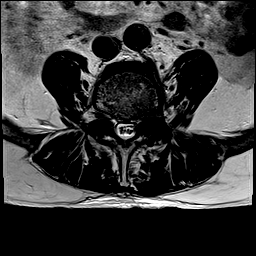
[im 17/38]
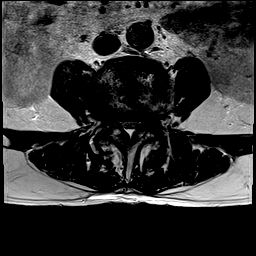
[im 21/38]
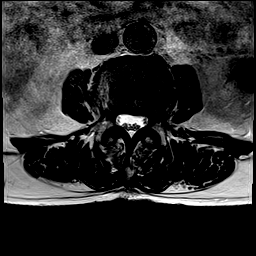
[im 25/38]
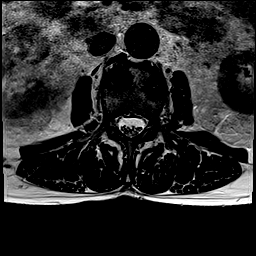
[im 33/38]
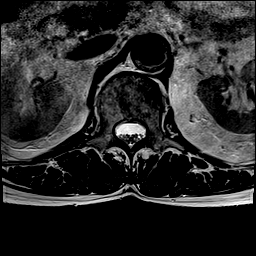
[im 38/38]
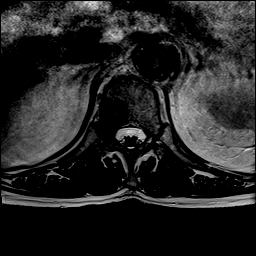

[31 of 48 positions shown; findings below may reference images not displayed]

FINDINGS: Segmentation:  Standard.

Alignment: Grade 1 anterolisthesis of L3 over L4 and small
retrolisthesis of L4 over L5.

Vertebrae: Diffuse metastatic disease involving the visualized
vertebrae and right iliac bone, confluent at L3 and L4. Compression
fracture of the L4 superior endplate is minimally progressed with
approximately 20% loss of vertebral body height.

Conus medullaris and cauda equina: Conus extends to the L1 level.
Conus and cauda equina appear normal.

Paraspinal and other soft tissues: Negative.

Disc levels:

T12-L1: No spinal canal or neural foraminal stenosis.

L1-2: Disc bulge and mild facet degenerative changes resulting in
mild bilateral neural foraminal narrowing. No significant spinal
canal stenosis.

L2-3: Disc bulge, facet degenerative changes ligamentum flavum
redundancy resulting in mild spinal canal stenosis and mild
bilateral neural foraminal narrowing.

L3-4: Disc bulge, prominent hypertrophic facet degenerative changes
and ligamentum flavum redundancy with prominence of the epidural fat
resulting severe spinal canal stenosis and moderate bilateral neural
foraminal narrowing, unchanged.

L4-5: Disc bulge with associated osteophytic component, facet
degenerative changes ligamentum flavum redundancy resulting in
moderate spinal canal stenosis with narrowing bilateral subarticular
zones, moderate right and severe left neural foraminal narrowing,
unchanged.

L5-S1: Disc bulge with superimposed small central disc protrusion
and moderate facet degenerative changes resulting in mild narrowing
of the bilateral subarticular zones, moderate right and severe left
foraminal narrowing, unchanged.
IMPRESSION: 1. Diffuse osseous metastatic disease, similar to prior MRI with
minimal progression of superior endplate compression fracture of L4.
2. Degenerative changes of the lumbar spine resulting in severe
spinal canal stenosis at L3-4 and moderate at L4-5.
3. Moderate right and severe left neural foraminal at L4-5 and
L5-S1.
4. Moderate bilateral neural foraminal narrowing at L3-4.

## 2020-07-23 MED ORDER — GADOBUTROL 1 MMOL/ML IV SOLN
7.5000 mL | Freq: Once | INTRAVENOUS | Status: AC | PRN
  Administered 2020-07-23: 7.5 mL via INTRAVENOUS

## 2020-07-29 ENCOUNTER — Ambulatory Visit (HOSPITAL_COMMUNITY): Payer: Medicare Other | Admitting: Hematology

## 2020-07-30 ENCOUNTER — Inpatient Hospital Stay (HOSPITAL_COMMUNITY): Payer: Medicare Other | Attending: Hematology | Admitting: Hematology

## 2020-07-30 ENCOUNTER — Telehealth (HOSPITAL_COMMUNITY): Payer: Self-pay | Admitting: *Deleted

## 2020-07-30 DIAGNOSIS — E039 Hypothyroidism, unspecified: Secondary | ICD-10-CM | POA: Insufficient documentation

## 2020-07-30 DIAGNOSIS — M25551 Pain in right hip: Secondary | ICD-10-CM | POA: Insufficient documentation

## 2020-07-30 DIAGNOSIS — R630 Anorexia: Secondary | ICD-10-CM | POA: Insufficient documentation

## 2020-07-30 DIAGNOSIS — C61 Malignant neoplasm of prostate: Secondary | ICD-10-CM | POA: Insufficient documentation

## 2020-07-30 DIAGNOSIS — I2510 Atherosclerotic heart disease of native coronary artery without angina pectoris: Secondary | ICD-10-CM | POA: Insufficient documentation

## 2020-07-30 DIAGNOSIS — K5900 Constipation, unspecified: Secondary | ICD-10-CM | POA: Insufficient documentation

## 2020-07-30 DIAGNOSIS — I10 Essential (primary) hypertension: Secondary | ICD-10-CM | POA: Insufficient documentation

## 2020-07-30 DIAGNOSIS — C7951 Secondary malignant neoplasm of bone: Secondary | ICD-10-CM | POA: Insufficient documentation

## 2020-07-30 DIAGNOSIS — R232 Flushing: Secondary | ICD-10-CM | POA: Insufficient documentation

## 2020-08-01 ENCOUNTER — Other Ambulatory Visit (HOSPITAL_COMMUNITY): Payer: Self-pay

## 2020-08-01 ENCOUNTER — Other Ambulatory Visit (HOSPITAL_COMMUNITY): Payer: Self-pay | Admitting: *Deleted

## 2020-08-01 DIAGNOSIS — C61 Malignant neoplasm of prostate: Secondary | ICD-10-CM

## 2020-08-01 MED ORDER — HYDROMORPHONE HCL 4 MG PO TABS: 4.0000 mg | ORAL_TABLET | Freq: Three times a day (TID) | ORAL | 0 refills | Status: DC | PRN

## 2020-08-01 MED ORDER — FENTANYL 25 MCG/HR TD PT72: 1.0000 | MEDICATED_PATCH | TRANSDERMAL | 0 refills | Status: DC

## 2020-08-06 ENCOUNTER — Other Ambulatory Visit (HOSPITAL_COMMUNITY): Payer: Self-pay

## 2020-08-14 ENCOUNTER — Ambulatory Visit (HOSPITAL_COMMUNITY): Payer: Medicare Other | Admitting: Dietician

## 2020-08-14 ENCOUNTER — Ambulatory Visit (HOSPITAL_COMMUNITY): Payer: Medicare Other | Admitting: Hematology

## 2020-08-14 ENCOUNTER — Inpatient Hospital Stay (HOSPITAL_COMMUNITY): Payer: Medicare Other

## 2020-08-14 ENCOUNTER — Ambulatory Visit (HOSPITAL_COMMUNITY): Payer: Medicare Other

## 2020-08-14 NOTE — Progress Notes (Signed)
Nutrition Follow-up:  Patient with prostate cancer  metastatic to bone. He is receiving Lupron and Zytiga.   Spoke with patient via telephone today. Reports he had to cancel appointments today secondary to feeling so weak and no one to drive him. Patient says he is having a bad day, having sinus drainage which has made him nauseas. Patient plans to go to pharmacy later today for some sinus medication. He reports ongoing poor appetite, says he is taking appetite stimulant once/day but it has not helped. Patient is drinking Ensure supplement 3-4 daily. He is requesting another sample case, unsure if he would be able to pick it up before his next office visit due to daughter working 7AM-7PM. He is appreciative of coupons to assist with purchase of supplements for the next couple of weeks.     Medications: Dilaudid, Fentanyl, Compazine, Linzess, Gabapentin, Lactulose, Marinal, Cymbalta, Protonix  Labs: no new labs  Anthropometrics: No new weights for review - pt 167 lb 8 oz on 5/19 increased from 162 lb 14.4 oz on 4/21   NUTRITION DIAGNOSIS: Inadequate oral intake ongoing    INTERVENTION:  Encouraged patient to take appetite stimulant as prescribed (2.5 mg twice/day before meals) Reviewed high calorie, high protein foods  Encouraged small meals/snacks every 3 hours Continue drinking Ensure Plus (350 kcal, 16 g protein) TID Will mail coupons today Will leave Ensure complimentary case for pick up at registration desk on June 28 Discussed transportation concerns with financial services admin (pt to be contacted for additional questions/transportation eligibility) Patient has contact information    MONITORING, EVALUATION, GOAL: weight trends, intake   NEXT VISIT: To be scheduled with treatment

## 2020-08-15 ENCOUNTER — Encounter (HOSPITAL_COMMUNITY): Payer: Self-pay | Admitting: *Deleted

## 2020-08-15 ENCOUNTER — Other Ambulatory Visit (HOSPITAL_COMMUNITY): Payer: Self-pay | Admitting: *Deleted

## 2020-08-15 DIAGNOSIS — C61 Malignant neoplasm of prostate: Secondary | ICD-10-CM

## 2020-08-15 DIAGNOSIS — C7951 Secondary malignant neoplasm of bone: Secondary | ICD-10-CM

## 2020-08-18 ENCOUNTER — Other Ambulatory Visit (HOSPITAL_COMMUNITY): Payer: Self-pay | Admitting: Surgery

## 2020-08-18 DIAGNOSIS — C61 Malignant neoplasm of prostate: Secondary | ICD-10-CM

## 2020-08-18 MED ORDER — HYDROMORPHONE HCL 4 MG PO TABS: 4.0000 mg | ORAL_TABLET | Freq: Three times a day (TID) | ORAL | 0 refills | Status: DC | PRN

## 2020-08-18 MED ORDER — FENTANYL 25 MCG/HR TD PT72: 1.0000 | MEDICATED_PATCH | TRANSDERMAL | 0 refills | Status: DC

## 2020-08-22 ENCOUNTER — Encounter (HOSPITAL_COMMUNITY): Payer: Self-pay | Admitting: Hematology

## 2020-08-22 MED ORDER — HYDROMORPHONE HCL 4 MG PO TABS: 4.0000 mg | ORAL_TABLET | Freq: Three times a day (TID) | ORAL | 0 refills | Status: DC | PRN

## 2020-08-25 ENCOUNTER — Other Ambulatory Visit (HOSPITAL_COMMUNITY): Payer: Self-pay | Admitting: *Deleted

## 2020-08-25 DIAGNOSIS — C7951 Secondary malignant neoplasm of bone: Secondary | ICD-10-CM

## 2020-08-25 DIAGNOSIS — C61 Malignant neoplasm of prostate: Secondary | ICD-10-CM

## 2020-08-26 ENCOUNTER — Other Ambulatory Visit: Payer: Self-pay

## 2020-08-26 ENCOUNTER — Inpatient Hospital Stay (HOSPITAL_BASED_OUTPATIENT_CLINIC_OR_DEPARTMENT_OTHER): Payer: Medicare Other | Admitting: Hematology and Oncology

## 2020-08-26 ENCOUNTER — Inpatient Hospital Stay (HOSPITAL_COMMUNITY): Payer: Medicare Other | Attending: Hematology

## 2020-08-26 ENCOUNTER — Inpatient Hospital Stay (HOSPITAL_COMMUNITY): Payer: Medicare Other

## 2020-08-26 VITALS — Wt 162.2 lb

## 2020-08-26 VITALS — BP 175/89 | HR 58 | Temp 97.0°F | Resp 18

## 2020-08-26 DIAGNOSIS — M25551 Pain in right hip: Secondary | ICD-10-CM | POA: Diagnosis not present

## 2020-08-26 DIAGNOSIS — C7951 Secondary malignant neoplasm of bone: Secondary | ICD-10-CM

## 2020-08-26 DIAGNOSIS — K5900 Constipation, unspecified: Secondary | ICD-10-CM | POA: Diagnosis not present

## 2020-08-26 DIAGNOSIS — E039 Hypothyroidism, unspecified: Secondary | ICD-10-CM | POA: Diagnosis not present

## 2020-08-26 DIAGNOSIS — C61 Malignant neoplasm of prostate: Secondary | ICD-10-CM

## 2020-08-26 DIAGNOSIS — R232 Flushing: Secondary | ICD-10-CM | POA: Diagnosis not present

## 2020-08-26 DIAGNOSIS — I2510 Atherosclerotic heart disease of native coronary artery without angina pectoris: Secondary | ICD-10-CM | POA: Diagnosis not present

## 2020-08-26 DIAGNOSIS — R630 Anorexia: Secondary | ICD-10-CM | POA: Diagnosis not present

## 2020-08-26 DIAGNOSIS — I10 Essential (primary) hypertension: Secondary | ICD-10-CM | POA: Diagnosis not present

## 2020-08-26 LAB — CBC WITH DIFFERENTIAL/PLATELET
Abs Immature Granulocytes: 0.02 10*3/uL (ref 0.00–0.07)
Basophils Absolute: 0.1 10*3/uL (ref 0.0–0.1)
Basophils Relative: 1 %
Eosinophils Absolute: 0.5 10*3/uL (ref 0.0–0.5)
Eosinophils Relative: 7 %
HCT: 40.3 % (ref 39.0–52.0)
Hemoglobin: 13.4 g/dL (ref 13.0–17.0)
Immature Granulocytes: 0 %
Lymphocytes Relative: 28 %
Lymphs Abs: 2.1 10*3/uL (ref 0.7–4.0)
MCH: 31.5 pg (ref 26.0–34.0)
MCHC: 33.3 g/dL (ref 30.0–36.0)
MCV: 94.6 fL (ref 80.0–100.0)
Monocytes Absolute: 0.7 10*3/uL (ref 0.1–1.0)
Monocytes Relative: 9 %
Neutro Abs: 4.1 10*3/uL (ref 1.7–7.7)
Neutrophils Relative %: 55 %
Platelets: 233 10*3/uL (ref 150–400)
RBC: 4.26 MIL/uL (ref 4.22–5.81)
RDW: 13.4 % (ref 11.5–15.5)
WBC: 7.4 10*3/uL (ref 4.0–10.5)
nRBC: 0 % (ref 0.0–0.2)

## 2020-08-26 LAB — COMPREHENSIVE METABOLIC PANEL
ALT: 23 U/L (ref 0–44)
AST: 21 U/L (ref 15–41)
Albumin: 4 g/dL (ref 3.5–5.0)
Alkaline Phosphatase: 49 U/L (ref 38–126)
Anion gap: 6 (ref 5–15)
BUN: 19 mg/dL (ref 8–23)
CO2: 25 mmol/L (ref 22–32)
Calcium: 9.1 mg/dL (ref 8.9–10.3)
Chloride: 107 mmol/L (ref 98–111)
Creatinine, Ser: 0.56 mg/dL — ABNORMAL LOW (ref 0.61–1.24)
GFR, Estimated: >60 mL/min (ref >60)
Glucose, Bld: 96 mg/dL (ref 70–99)
Potassium: 3.6 mmol/L (ref 3.5–5.1)
Sodium: 138 mmol/L (ref 135–145)
Total Bilirubin: 0.7 mg/dL (ref 0.3–1.2)
Total Protein: 6.7 g/dL (ref 6.5–8.1)

## 2020-08-26 LAB — PSA: Prostatic Specific Antigen: 0.03 ng/mL (ref 0.00–4.00)

## 2020-08-26 MED ORDER — DENOSUMAB 120 MG/1.7ML ~~LOC~~ SOLN
120.0000 mg | Freq: Once | SUBCUTANEOUS | Status: AC
  Administered 2020-08-26: 120 mg via SUBCUTANEOUS

## 2020-08-26 MED ORDER — DENOSUMAB 120 MG/1.7ML ~~LOC~~ SOLN
SUBCUTANEOUS | Status: AC
  Filled 2020-08-26: qty 1.7

## 2020-08-26 MED ORDER — FENTANYL 25 MCG/HR TD PT72: 1.0000 | MEDICATED_PATCH | TRANSDERMAL | 0 refills | Status: DC

## 2020-08-26 MED ORDER — HYDROMORPHONE HCL 4 MG PO TABS: 4.0000 mg | ORAL_TABLET | Freq: Three times a day (TID) | ORAL | 0 refills | Status: DC | PRN

## 2020-08-26 NOTE — Patient Instructions (Signed)
[place]  Discharge Instructions: Thank you for choosing [facility] to provide your oncology and hematology care.  If you have a lab appointment with the [facility], please come in thru the Main Entrance and check in at the main information desk.  Wear comfortable clothing and clothing appropriate for easy access to any Portacath or PICC line.   We strive to give you quality time with your provider. You may need to reschedule your appointment if you arrive late (15 or more minutes).  Arriving late affects you and other patients whose appointments are after yours.  Also, if you miss three or more appointments without notifying the office, you may be dismissed from the clinic at the provider's discretion.      For prescription refill requests, have your pharmacy contact our office and allow 72 hours for refills to be completed.    Today you received the following chemotherapy and/or immunotherapy agents Xgeva      To help prevent nausea and vomiting after your treatment, we encourage you to take your nausea medication as directed.  BELOW ARE SYMPTOMS THAT SHOULD BE REPORTED IMMEDIATELY: *FEVER GREATER THAN 100.4 F (38 C) OR HIGHER *CHILLS OR SWEATING *NAUSEA AND VOMITING THAT IS NOT CONTROLLED WITH YOUR NAUSEA MEDICATION *UNUSUAL SHORTNESS OF BREATH *UNUSUAL BRUISING OR BLEEDING *URINARY PROBLEMS (pain or burning when urinating, or frequent urination) *BOWEL PROBLEMS (unusual diarrhea, constipation, pain near the anus) TENDERNESS IN MOUTH AND THROAT WITH OR WITHOUT PRESENCE OF ULCERS (sore throat, sores in mouth, or a toothache) UNUSUAL RASH, SWELLING OR PAIN  UNUSUAL VAGINAL DISCHARGE OR ITCHING   Items with * indicate a potential emergency and should be followed up as soon as possible or go to the Emergency Department if any problems should occur.  Please show the CHEMOTHERAPY ALERT CARD or IMMUNOTHERAPY ALERT CARD at check-in to the Emergency Department  and triage nurse.  Should you have questions after your visit or need to cancel or reschedule your appointment, please contact [facility] [phone]  and follow the prompts.  Office hours are 8:00 a.m. to 4:30 p.m. Monday - Friday. Please note that voicemails left after 4:00 p.m. may not be returned until the following business day.  We are closed weekends and major holidays. You have access to a nurse at all times for urgent questions. Please call the main number to the clinic [phone] and follow the prompts.  For any non-urgent questions, you may also contact your provider using MyChart. We now offer e-Visits for anyone 31 and older to request care online for non-urgent symptoms. For details visit [email or web address].   Also download the MyChart app! Go to the app store, search "MyChart", open the app, select [place], and log in with your MyChart username and password.  Due to Covid, a mask is required upon entering the hospital/clinic. If you do not have a mask, one will be given to you upon arrival. For doctor visits, patients may have 1 support person aged 67 or older with them. For treatment visits, patients cannot have anyone with them due to current Covid guidelines and our immunocompromised population.

## 2020-08-26 NOTE — Progress Notes (Signed)
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    CANCER STAGING: Cancer Staging No matching staging information was found for the patient.  INTERVAL HISTORY:  Mr. Keith Goth., a 80 y.o. male, returns for routine follow-up of his metastatic castration sensitive prostate cancer. Shaheer was last seen on 07/10/2020.   On exam today [name] reports his been well overall in the interim since her last visit.  He reports that he is currently out of pain medication and is down to his left 2 fentanyl patches.  He reports that without this medication he has severe pain in his back and his abdomen.  He is also concerned about the pain in his hip and hoping that the MRI revealed a clear etiology.  He does have several hot flashes per day while on this medication but does not have any other difficulties with it.  He notes he has been compliant with his Fabio Asa and takes it as prescribed.  He also denies having any jaw pain today.  A full 10 point ROS is listed below.  REVIEW OF SYSTEMS:  Review of Systems  Constitutional:  Positive for appetite change (25%) and fatigue (25%).  HENT:          Nasal congestion  Gastrointestinal:  Positive for nausea. Negative for vomiting.  Genitourinary:  Positive for difficulty urinating (frequency) and dysuria.   Musculoskeletal:  Positive for arthralgias (R hip 7/10).  Neurological:  Positive for headaches.  Psychiatric/Behavioral:  Positive for depression. The patient is nervous/anxious.    PAST MEDICAL/SURGICAL  HISTORY:  Past Medical History:  Diagnosis Date   Agent orange exposure    Arthritis    Coronary artery disease    BMS to mid LAD 2001, DES to proximal LAD 2017   Enlarged prostate    XRT 2016   Headache    Sinus headaches    Heart abnormality    History of depression    Hyperlipidemia    Hypothyroidism    Prostate cancer ([facility]) 07/2014   hormonal therapy, external beam radiation therapy   PTSD (post-traumatic stress disorder)    Past Surgical History:  Procedure Laterality Date   BRONCHIAL BRUSHINGS  07/02/2019   Procedure: BRONCHIAL BRUSHINGS;  Surgeon: Rigoberto Noel, MD;  [facility]: [facility];  Service: Cardiopulmonary;;   BRONCHIAL NEEDLE ASPIRATION BIOPSY  07/02/2019   Procedure: BRONCHIAL NEEDLE ASPIRATION BIOPSIES;  Surgeon: Rigoberto Noel, MD;  [facility];  Service: Cardiopulmonary;;   BRONCHIAL WASHINGS  07/02/2019   Procedure: BRONCHIAL WASHINGS;  Surgeon: Rigoberto Noel, MD;  [facility];  Service: Cardiopulmonary;;   CARDIAC CATHETERIZATION N/A 12/12/2015   Procedure: Left Heart Cath and Coronary Angiography;  Surgeon: Peter M Martinique, MD;  Location: [facility];  Service: Cardiovascular;  Laterality: N/A;   CARDIAC CATHETERIZATION N/A 12/12/2015   Procedure: Coronary Stent Intervention;  Surgeon: Peter M Martinique, MD;  Location: [facility];  Service: Cardiovascular;  Laterality: N/A;   CORONARY STENT INTERVENTION N/A 09/06/2017   Procedure: CORONARY STENT INTERVENTION;  Surgeon:  Burnell Blanks, MD;  Location: [facility];  Service: Cardiovascular;  Laterality: N/A;   ENDOBRONCHIAL ULTRASOUND N/A 07/02/2019   Procedure: ENDOBRONCHIAL ULTRASOUND;  Surgeon: Rigoberto Noel, MD;  [facility];  Service: Cardiopulmonary;  Laterality: N/A;   FLEXIBLE BRONCHOSCOPY  07/02/2019   Procedure: FLEXIBLE BRONCHOSCOPY;  Surgeon: Rigoberto Noel, MD;  [facility];  Service: Cardiopulmonary;;   HERNIA REPAIR Right    LEFT  HEART CATH AND CORONARY ANGIOGRAPHY N/A 09/02/2016   Procedure: Left Heart Cath and Coronary Angiography;  Surgeon: Leonie Man, MD;  Location: [facility];  Service: Cardiovascular;  Laterality: N/A;   LEFT HEART CATH AND CORONARY ANGIOGRAPHY N/A 09/06/2017   Procedure: LEFT HEART CATH AND CORONARY ANGIOGRAPHY;  Surgeon: Burnell Blanks, MD;  Location: [facility];  Service: Cardiovascular;  Laterality: N/A;   LEFT HEART CATH AND CORONARY ANGIOGRAPHY N/A 05/04/2019   Procedure: LEFT HEART CATH AND CORONARY ANGIOGRAPHY;  Surgeon: Martinique, Peter M, MD;  Location: [facility];  Service: Cardiovascular;  Laterality: N/A;   PROSTATE BIOPSY N/A 08/28/2014   Procedure: BIOPSY TRANSRECTAL ULTRASONIC PROSTATE (TUBP);  Surgeon: Rana Snare, MD;  [facility];  Service: Urology;  Laterality: N/A;   TEE WITHOUT CARDIOVERSION N/A 04/08/2020   Procedure: TRANSESOPHAGEAL ECHOCARDIOGRAM (TEE) WITH PROPOFOL;  Surgeon: Arnoldo Lenis, MD;  [facility];  Service: Endoscopy;  Laterality: N/A;   TRANSURETHRAL RESECTION OF PROSTATE N/A 08/28/2014   Procedure: TRANSURETHRAL RESECTION OF THE PROSTATE WITH GYRUS INSTRUMENTS;  Surgeon: Rana Snare, MD;  [facility];  Service: Urology;  Laterality: N/A;    SOCIAL HISTORY:  Social History   Socioeconomic History   Marital status: Married    Spouse name: Not on file   Number of children: 5   Years of education: Not on file   Highest education level: Not on file  Occupational History   Occupation: retired  Tobacco Use   Smoking status: Never   Smokeless tobacco: Former    Types: Nurse, children's Use: Never used  Substance and Sexual Activity   Alcohol use: No   Drug use: No   Sexual activity: Yes  Other Topics Concern   Not on file  Social History Narrative   Married   5 children   Social Determinants of Health   Financial Resource Strain: Not on file  Food Insecurity: Not on file   Transportation Needs: Not on file  Physical Activity: Not on file  Stress: Not on file  Social Connections: Not on file  Intimate Partner Violence: Not on file    FAMILY HISTORY:  Family History  Problem Relation Age of Onset   Arthritis Mother    Heart attack Father     CURRENT MEDICATIONS:  Current Outpatient Medications  Medication Sig Dispense Refill   abiraterone acetate (ZYTIGA) 250 MG tablet TAKE 4 TABLETS (1,000 MG TOTAL) BY MOUTH DAILY. TAKE ON AN EMPTY STOMACH 1 HOUR BEFORE OR 2 HOURS AFTER A MEAL 120 tablet 6   ALPRAZolam (XANAX) 0.5 MG tablet Take 1 tablet (0.5 mg total) by mouth 2 (two) times daily as needed for anxiety. 10 tablet 0   amoxicillin-clavulanate (AUGMENTIN) 875-125 MG tablet Take 1 tablet by mouth 2 (two) times daily. 14 tablet 0   aspirin EC 81 MG tablet Take 1 tablet (81 mg total) by mouth daily.     ceFAZolin (ANCEF) 10 g injection  cetirizine (ZYRTEC) 10 MG tablet Take 10 mg by mouth daily.     diclofenac sodium (VOLTAREN) 1 % GEL APPLY 2 GRAMS TOPICALLY 4 TIMES DAILY (Patient taking differently: Apply 2 g topically 4 (four) times daily.) 100 g 5   dronabinol (MARINOL) 2.5 MG capsule Take 1 capsule (2.5 mg total) by mouth 2 (two) times daily before a meal. 60 capsule 2   DULoxetine (CYMBALTA) 60 MG capsule Take 60 mg by mouth daily.     fentaNYL (DURAGESIC) 25 MCG/HR Place 1 patch onto the skin every 3 (three) days. 5 patch 0   fluticasone (FLONASE) 50 MCG/ACT nasal spray Place 2 sprays into both nostrils daily.     gabapentin (NEURONTIN) 400 MG capsule TAKE 1 CAPSULE BY MOUTH THREE TIMES DAILY 90 capsule 3   HYDROmorphone (DILAUDID) 4 MG tablet Take 1 tablet (4 mg total) by mouth every 8 (eight) hours as needed for severe pain. 90 tablet 0   lactulose (CHRONULAC) 10 GM/15ML solution Take 30 mLs (20 g total) by mouth every 3 (three) hours as needed for mild constipation (take 67m po every 3 hrs until BM). 480 mL 0   linaclotide (LINZESS) 145 MCG  CAPS capsule 1 capsule at least 30 minutes before the first meal of the day on an empty stomach     lisinopril (PRINIVIL) 10 MG tablet Take 1 tablet (10 mg total) by mouth daily. 30 tablet 6   NARCAN 4 MG/0.1ML LIQD nasal spray kit Place 1 spray into the nose once.     nitroGLYCERIN (NITROSTAT) 0.4 MG SL tablet Place 1 tablet (0.4 mg total) under the tongue every 5 (five) minutes as needed for chest pain. X 3 doses 25 tablet 12   pantoprazole (PROTONIX) 40 MG tablet Take 1 tablet (40 mg total) by mouth daily. 30 tablet 3   polyethylene glycol powder (GLYCOLAX/MIRALAX) 17 GM/SCOOP powder Take 17 g by mouth daily as needed for mild constipation or moderate constipation.      predniSONE (DELTASONE) 5 MG tablet TAKE 1 TABLET (5 MG TOTAL) BY MOUTH DAILY WITH BREAKFAST. 30 tablet 6   prochlorperazine (COMPAZINE) 10 MG tablet Take 1 tablet (10 mg total) by mouth every 6 (six) hours as needed for nausea or vomiting. 30 tablet 3   tamsulosin (FLOMAX) 0.4 MG CAPS capsule Take 0.4 mg by mouth daily.     No current facility-administered medications for this visit.    ALLERGIES:  Allergies  Allergen Reactions   Lipitor [Atorvastatin]     Muscles aches    PHYSICAL EXAM:  Performance status (ECOG): 1 - Symptomatic but completely ambulatory  There were no vitals filed for this visit.  Wt Readings from Last 3 Encounters:  07/10/20 167 lb 8 oz (76 kg)  06/19/20 162 lb 14.4 oz (73.9 kg)  06/19/20 162 lb 9.6 oz (73.8 kg)   Physical Exam Vitals reviewed.  Constitutional:      Appearance: Normal appearance.  Cardiovascular:     Rate and Rhythm: Normal rate and regular rhythm.     Pulses: Normal pulses.     Heart sounds: Normal heart sounds.  Pulmonary:     Effort: Pulmonary effort is normal.     Breath sounds: Normal breath sounds.  Musculoskeletal:     Right lower leg: No edema.     Left lower leg: No edema.  Neurological:     General: No focal deficit present.     Mental Status: He is alert  and oriented to person, place,  and time.  Psychiatric:        Mood and Affect: Mood normal.        Behavior: Behavior normal.     LABORATORY DATA:  I have reviewed the labs as listed.  CBC Latest Ref Rng & Units 07/10/2020 06/19/2020 05/23/2020  WBC 4.0 - 10.5 K/uL 7.0 7.7 7.0  Hemoglobin 13.0 - 17.0 g/dL 12.9(L) 13.1 13.9  Hematocrit 39.0 - 52.0 % 38.1(L) 40.7 44.1  Platelets 150 - 400 K/uL 239 262 270   CMP Latest Ref Rng & Units 07/17/2020 07/10/2020 06/19/2020  Glucose 70 - 99 mg/dL 113(H) 108(H) 89  BUN 8 - 23 mg/dL _0 Creatinine 0.61 - 1.24 mg/dL 0.55(L) 0.55(L) 0.62  Sodium 135 - 145 mmol/L 135 137 138  Potassium 3.5 - 5.1 mmol/L 4.0 3.4(L) 4.5  Chloride 98 - 111 mmol/L 102 104 103  CO2 22 - 32 mmol/L _1 Calcium 8.9 - 10.3 mg/dL 9.1 8.8(L) 9.4  Total Protein 6.5 - 8.1 g/dL 7.0 6.9 7.1  Total Bilirubin 0.3 - 1.2 mg/dL 0.5 0.5 0.5  Alkaline Phos 38 - 126 U/L 54 48 62  AST 15 - 41 U/L _2 ALT 0 - 44 U/L _3 DIAGNOSTIC IMAGING:  I have independently reviewed the scans and discussed with the patient. No results found.   ASSESSMENT:  1.  Metastatic castration sensitive prostate cancer: -History of prostate cancer diagnosed 08/28/2014, Gleason 4+4= 8, status post XRT and seed implants. -Presentation with low back pain of several months, PSA on 06/19/2019 was 239, testosterone 210, alkaline phosphatase 306. -MRI of the cervical, thoracic and lumbar spine on 07/27/2019 shows diffuse bone metastatic disease.  Mild to moderate compression deformity of T3 with mild ventral epidural extension.  No cord compression.  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 -CT angio of the chest, abdomen and pelvis on 06/27/2019 shows multiple lung nodules bilaterally, largest in the left upper lobe measuring 2.5 x 1.6 x 1.5 cm with associated left hilar and AP window  lymphadenopathy. -Navigational bronchoscopy and biopsy on 07/02/2019 with upper lobe brushing and washing shows atypical cells.  10 L FNA also shows atypical cells. -1 pack/day for 4-5 years, quit 60 years ago.  Positive agent orange exposure -Lupron 45 mg on 07/12/2019 along with 1 month of Casodex. -PET scan on 08/11/2019 shows mildly hypermetabolic widespread sclerotic osseous metastasis throughout the axial and proximal appendicular skeleton, increased sclerosis since 06/27/2018 on CT angiogram.  This could represent response to therapy.  No hypermetabolic lymphadenopathy.  Left mediastinal and left hilar adenopathy is seen on 06/19/2019 has resolved.  Bilateral pulmonary nodules are stable to decreased in size.  Dominant left upper lobe lung nodule has decreased from 2.5 cm to 1.7 cm. -Abiraterone was started around 08/29/2019. -PET scan was done on 03/03/2020 as he complained of joint pains.  New mildly hypermetabolic mildly enlarged right subcarinal lymph node measuring 1 cm, SUV 4.1.  No other lymphadenopathy.  Widespread sclerotic lesions throughout the axial and proximal appendicular skeleton, appearing stable in distribution and increase in sclerosis.   2.  Low back pain: -10 treatments of radiation therapy completed on 07/11/2019.   PLAN:  1.  Metastatic castration sensitive prostate cancer: -Continue Abiraterone 4 tablets on empty stomach along with prednisone 5 mg daily. - Reviewed labs today which showed normal CBC.  LFTs were normal.  Creatinine was normal. - Last PSA was less than 0.01 on 06/19/2020. Today's result is pending.  - RTC 2 months with interval monthly Xgeva injections   2.  Right hip pain: -Continue fentanyl 25 mcg patch. Refilled today - He reports taking Dilaudid 4 mg up to 4 tablets/day.  Refilled today.  - continue Dilaudid every 8 hours  - He reports pain towards the right side of the lower back which is radiating down his right foot.  I have recommended MRI of the  lumbar spine and MRI of the right hip with and without contrast.   3.  Loss of appetite: -Continue Marinol 2.5 mg twice daily. - Continue ensures 3 cans/day.   4.  Constipation: -Continue stool softener and MiraLAX.   5.  Bone mets: -Continue calcium and vitamin D supplements.  Calcium is normal.  Continue denosumab monthly.   6.  Hypertension: -Continue lisinopril 10 mg daily.  Blood pressure today is better controlled.   7.  Hot flashes: -He is not requiring on a daily basis.  Use as needed.   8.  High risk drug monitoring: - His blood pressure is stable.  Potassium is normal.  LFTs are normal.   Orders placed this encounter:  No orders of the defined types were placed in this encounter.  Ledell Peoples, MD Department of Hematology/Oncology [facility] Phone: [phone] Pager: [phone] Email: [email or web address]

## 2020-08-26 NOTE — Progress Notes (Signed)
Patient presents today for Xgeva injection per MD order.  Calcium noted to be 9.1.  Patient has had no jaw pain or dental work.  BP noted to be elevated, MD notified and message received from Dr.Dorsey okay to proceed with Xgeva.  Stable during Xgeva administration without incident; injection site WNL; see MAR for injection details.  Patient tolerated procedure well and without incident.  No questions or complaints noted at this time.

## 2020-08-28 ENCOUNTER — Other Ambulatory Visit (HOSPITAL_COMMUNITY): Payer: Self-pay

## 2020-08-29 ENCOUNTER — Other Ambulatory Visit (HOSPITAL_COMMUNITY): Payer: Self-pay | Admitting: Hematology

## 2020-09-02 ENCOUNTER — Encounter (HOSPITAL_COMMUNITY): Payer: Self-pay | Admitting: Hematology

## 2020-09-04 ENCOUNTER — Other Ambulatory Visit (HOSPITAL_COMMUNITY): Payer: Self-pay

## 2020-09-15 ENCOUNTER — Telehealth (HOSPITAL_COMMUNITY): Payer: Self-pay | Admitting: Dietician

## 2020-09-15 NOTE — Telephone Encounter (Signed)
Nutrition   Attempted to contact patient via telephone for nutrition follow-up. Spoke with son of patient who reports patient is taking a nap and will have patient return call this afternoon. Contact information provided to son.

## 2020-09-22 ENCOUNTER — Other Ambulatory Visit (HOSPITAL_COMMUNITY): Payer: Self-pay | Admitting: *Deleted

## 2020-09-22 ENCOUNTER — Other Ambulatory Visit (HOSPITAL_COMMUNITY): Payer: Self-pay

## 2020-09-22 DIAGNOSIS — C7951 Secondary malignant neoplasm of bone: Secondary | ICD-10-CM

## 2020-09-22 DIAGNOSIS — C61 Malignant neoplasm of prostate: Secondary | ICD-10-CM

## 2020-09-22 MED ORDER — HYDROMORPHONE HCL 4 MG PO TABS: 4.0000 mg | ORAL_TABLET | Freq: Three times a day (TID) | ORAL | 0 refills | Status: DC | PRN

## 2020-09-22 MED ORDER — FENTANYL 25 MCG/HR TD PT72: 1.0000 | MEDICATED_PATCH | TRANSDERMAL | 0 refills | Status: DC

## 2020-09-23 ENCOUNTER — Inpatient Hospital Stay (HOSPITAL_COMMUNITY): Payer: Medicare Other

## 2020-09-23 ENCOUNTER — Other Ambulatory Visit: Payer: Self-pay

## 2020-09-23 ENCOUNTER — Inpatient Hospital Stay (HOSPITAL_COMMUNITY): Payer: Medicare Other | Attending: Hematology

## 2020-09-23 VITALS — BP 145/70 | HR 63 | Temp 97.0°F | Resp 18

## 2020-09-23 DIAGNOSIS — C7951 Secondary malignant neoplasm of bone: Secondary | ICD-10-CM | POA: Diagnosis not present

## 2020-09-23 DIAGNOSIS — C61 Malignant neoplasm of prostate: Secondary | ICD-10-CM

## 2020-09-23 LAB — CBC WITH DIFFERENTIAL/PLATELET
Abs Immature Granulocytes: 0.01 10*3/uL (ref 0.00–0.07)
Basophils Absolute: 0.1 10*3/uL (ref 0.0–0.1)
Basophils Relative: 1 %
Eosinophils Absolute: 0.1 10*3/uL (ref 0.0–0.5)
Eosinophils Relative: 2 %
HCT: 40.2 % (ref 39.0–52.0)
Hemoglobin: 13.5 g/dL (ref 13.0–17.0)
Immature Granulocytes: 0 %
Lymphocytes Relative: 15 %
Lymphs Abs: 1.2 10*3/uL (ref 0.7–4.0)
MCH: 32.1 pg (ref 26.0–34.0)
MCHC: 33.6 g/dL (ref 30.0–36.0)
MCV: 95.5 fL (ref 80.0–100.0)
Monocytes Absolute: 0.6 10*3/uL (ref 0.1–1.0)
Monocytes Relative: 7 %
Neutro Abs: 6.2 10*3/uL (ref 1.7–7.7)
Neutrophils Relative %: 75 %
Platelets: 213 10*3/uL (ref 150–400)
RBC: 4.21 MIL/uL — ABNORMAL LOW (ref 4.22–5.81)
RDW: 12.8 % (ref 11.5–15.5)
WBC: 8.2 10*3/uL (ref 4.0–10.5)
nRBC: 0 % (ref 0.0–0.2)

## 2020-09-23 LAB — COMPREHENSIVE METABOLIC PANEL
ALT: 21 U/L (ref 0–44)
AST: 22 U/L (ref 15–41)
Albumin: 4.1 g/dL (ref 3.5–5.0)
Alkaline Phosphatase: 49 U/L (ref 38–126)
Anion gap: 7 (ref 5–15)
BUN: 14 mg/dL (ref 8–23)
CO2: 29 mmol/L (ref 22–32)
Calcium: 9 mg/dL (ref 8.9–10.3)
Chloride: 102 mmol/L (ref 98–111)
Creatinine, Ser: 0.63 mg/dL (ref 0.61–1.24)
GFR, Estimated: >60 mL/min (ref >60)
Glucose, Bld: 90 mg/dL (ref 70–99)
Potassium: 3.7 mmol/L (ref 3.5–5.1)
Sodium: 138 mmol/L (ref 135–145)
Total Bilirubin: 0.6 mg/dL (ref 0.3–1.2)
Total Protein: 6.8 g/dL (ref 6.5–8.1)

## 2020-09-23 MED ORDER — DENOSUMAB 120 MG/1.7ML ~~LOC~~ SOLN
SUBCUTANEOUS | Status: AC
  Filled 2020-09-23: qty 1.7

## 2020-09-23 MED ORDER — DENOSUMAB 120 MG/1.7ML ~~LOC~~ SOLN
120.0000 mg | Freq: Once | SUBCUTANEOUS | Status: AC
  Administered 2020-09-23: 120 mg via SUBCUTANEOUS

## 2020-09-23 NOTE — Patient Instructions (Signed)
[facility]  Discharge Instructions: Thank you for choosing [facility] to provide your oncology and hematology care.  If you have a lab appointment with the [facility], please come in thru the Main Entrance and check in at the main information desk.  Wear comfortable clothing and clothing appropriate for easy access to any Portacath or PICC line.   We strive to give you quality time with your provider. You may need to reschedule your appointment if you arrive late (15 or more minutes).  Arriving late affects you and other patients whose appointments are after yours.  Also, if you miss three or more appointments without notifying the office, you may be dismissed from the clinic at the provider's discretion.      For prescription refill requests, have your pharmacy contact our office and allow 72 hours for refills to be completed.    Today you received the following chemotherapy and/or immunotherapy agents Xgeva      To help prevent nausea and vomiting after your treatment, we encourage you to take your nausea medication as directed.  BELOW ARE SYMPTOMS THAT SHOULD BE REPORTED IMMEDIATELY: *FEVER GREATER THAN 100.4 F (38 C) OR HIGHER *CHILLS OR SWEATING *NAUSEA AND VOMITING THAT IS NOT CONTROLLED WITH YOUR NAUSEA MEDICATION *UNUSUAL SHORTNESS OF BREATH *UNUSUAL BRUISING OR BLEEDING *URINARY PROBLEMS (pain or burning when urinating, or frequent urination) *BOWEL PROBLEMS (unusual diarrhea, constipation, pain near the anus) TENDERNESS IN MOUTH AND THROAT WITH OR WITHOUT PRESENCE OF ULCERS (sore throat, sores in mouth, or a toothache) UNUSUAL RASH, SWELLING OR PAIN  UNUSUAL VAGINAL DISCHARGE OR ITCHING   Items with * indicate a potential emergency and should be followed up as soon as possible or go to the Emergency Department if any problems should occur.  Please show the CHEMOTHERAPY ALERT CARD or IMMUNOTHERAPY ALERT CARD at check-in to the Emergency Department  and triage nurse.  Should you have questions after your visit or need to cancel or reschedule your appointment, please contact [facility] [phone]  and follow the prompts.  Office hours are 8:00 a.m. to 4:30 p.m. Monday - Friday. Please note that voicemails left after 4:00 p.m. may not be returned until the following business day.  We are closed weekends and major holidays. You have access to a nurse at all times for urgent questions. Please call the main number to the clinic [phone] and follow the prompts.  For any non-urgent questions, you may also contact your provider using MyChart. We now offer e-Visits for anyone 57 and older to request care online for non-urgent symptoms. For details visit [email or web address].   Also download the MyChart app! Go to the app store, search "MyChart", open the app, select [facility], and log in with your MyChart username and password.  Due to Covid, a mask is required upon entering the hospital/clinic. If you do not have a mask, one will be given to you upon arrival. For doctor visits, patients may have 1 support person aged 51 or older with them. For treatment visits, patients cannot have anyone with them due to current Covid guidelines and our immunocompromised population.

## 2020-09-23 NOTE — Progress Notes (Signed)
Keith Olson. presents today for Xgeva injection per the provider's orders.  Calcium noted to be 9.0, patient has been taking calcium and vitamin D supplements, has had no major dental work, and no jaw pain.  Stable during administration without incident; injection site WNL; see MAR for injection details.  Patient tolerated procedure well and without incident.  No questions or complaints noted at this time.  Discharge from clinic ambulatory in stable condition.  Alert and oriented X 3.  Follow up with [facility] as scheduled.

## 2020-10-01 ENCOUNTER — Other Ambulatory Visit (HOSPITAL_COMMUNITY): Payer: Self-pay | Admitting: *Deleted

## 2020-10-01 ENCOUNTER — Other Ambulatory Visit (HOSPITAL_COMMUNITY): Payer: Self-pay

## 2020-10-01 DIAGNOSIS — C61 Malignant neoplasm of prostate: Secondary | ICD-10-CM

## 2020-10-01 MED ORDER — FENTANYL 25 MCG/HR TD PT72: 1.0000 | MEDICATED_PATCH | TRANSDERMAL | 0 refills | Status: DC

## 2020-10-03 ENCOUNTER — Other Ambulatory Visit (HOSPITAL_COMMUNITY): Payer: Self-pay

## 2020-10-03 ENCOUNTER — Encounter (HOSPITAL_COMMUNITY): Payer: Self-pay

## 2020-10-03 NOTE — Progress Notes (Signed)
Patient called reporting uncontrolled pain. Patient encouraged to be seen and assessed by a MD at Urgent Care or the ED. Patient verbalized understanding but is unsure if he will go. Patient to call the clinic and update on condition Monday.

## 2020-10-09 ENCOUNTER — Telehealth (HOSPITAL_COMMUNITY): Payer: Self-pay | Admitting: *Deleted

## 2020-10-09 DIAGNOSIS — G4700 Insomnia, unspecified: Secondary | ICD-10-CM | POA: Diagnosis not present

## 2020-10-09 NOTE — Telephone Encounter (Signed)
Patient called stating that he was having difficulty breathing and intractable generalized pain.  Advised that he needs to be evaluated in the emergency department.  Verbalized understanding.

## 2020-10-10 ENCOUNTER — Encounter (HOSPITAL_COMMUNITY): Payer: Self-pay | Admitting: Emergency Medicine

## 2020-10-10 ENCOUNTER — Inpatient Hospital Stay (HOSPITAL_COMMUNITY)
Admission: EM | Admit: 2020-10-10 | Discharge: 2020-10-13 | DRG: 092 | Disposition: A | Discharge status: Home / Self Care | Payer: Medicare Other | Attending: Internal Medicine | Admitting: Internal Medicine

## 2020-10-10 ENCOUNTER — Emergency Department (HOSPITAL_COMMUNITY): Payer: Medicare Other

## 2020-10-10 ENCOUNTER — Other Ambulatory Visit: Payer: Self-pay

## 2020-10-10 DIAGNOSIS — I10 Essential (primary) hypertension: Secondary | ICD-10-CM

## 2020-10-10 DIAGNOSIS — Z66 Do not resuscitate: Secondary | ICD-10-CM | POA: Diagnosis present

## 2020-10-10 DIAGNOSIS — I119 Hypertensive heart disease without heart failure: Secondary | ICD-10-CM | POA: Diagnosis present

## 2020-10-10 DIAGNOSIS — F4310 Post-traumatic stress disorder, unspecified: Secondary | ICD-10-CM | POA: Diagnosis present

## 2020-10-10 DIAGNOSIS — R0902 Hypoxemia: Secondary | ICD-10-CM | POA: Diagnosis not present

## 2020-10-10 DIAGNOSIS — W19XXXA Unspecified fall, initial encounter: Secondary | ICD-10-CM | POA: Diagnosis not present

## 2020-10-10 DIAGNOSIS — R55 Syncope and collapse: Secondary | ICD-10-CM | POA: Diagnosis not present

## 2020-10-10 DIAGNOSIS — C61 Malignant neoplasm of prostate: Secondary | ICD-10-CM | POA: Diagnosis not present

## 2020-10-10 DIAGNOSIS — G893 Neoplasm related pain (acute) (chronic): Secondary | ICD-10-CM | POA: Diagnosis present

## 2020-10-10 DIAGNOSIS — Z8546 Personal history of malignant neoplasm of prostate: Secondary | ICD-10-CM

## 2020-10-10 DIAGNOSIS — Z8261 Family history of arthritis: Secondary | ICD-10-CM

## 2020-10-10 DIAGNOSIS — E039 Hypothyroidism, unspecified: Secondary | ICD-10-CM | POA: Diagnosis present

## 2020-10-10 DIAGNOSIS — R6889 Other general symptoms and signs: Secondary | ICD-10-CM | POA: Diagnosis not present

## 2020-10-10 DIAGNOSIS — R0602 Shortness of breath: Secondary | ICD-10-CM | POA: Diagnosis present

## 2020-10-10 DIAGNOSIS — Z7982 Long term (current) use of aspirin: Secondary | ICD-10-CM

## 2020-10-10 DIAGNOSIS — I6521 Occlusion and stenosis of right carotid artery: Secondary | ICD-10-CM | POA: Diagnosis not present

## 2020-10-10 DIAGNOSIS — R531 Weakness: Secondary | ICD-10-CM

## 2020-10-10 DIAGNOSIS — R3911 Hesitancy of micturition: Secondary | ICD-10-CM | POA: Diagnosis not present

## 2020-10-10 DIAGNOSIS — R296 Repeated falls: Secondary | ICD-10-CM | POA: Diagnosis not present

## 2020-10-10 DIAGNOSIS — C7951 Secondary malignant neoplasm of bone: Secondary | ICD-10-CM | POA: Diagnosis not present

## 2020-10-10 DIAGNOSIS — E876 Hypokalemia: Secondary | ICD-10-CM | POA: Diagnosis not present

## 2020-10-10 DIAGNOSIS — I6503 Occlusion and stenosis of bilateral vertebral arteries: Secondary | ICD-10-CM | POA: Diagnosis not present

## 2020-10-10 DIAGNOSIS — Z955 Presence of coronary angioplasty implant and graft: Secondary | ICD-10-CM | POA: Diagnosis not present

## 2020-10-10 DIAGNOSIS — Z923 Personal history of irradiation: Secondary | ICD-10-CM

## 2020-10-10 DIAGNOSIS — F1123 Opioid dependence with withdrawal: Secondary | ICD-10-CM | POA: Diagnosis not present

## 2020-10-10 DIAGNOSIS — I2510 Atherosclerotic heart disease of native coronary artery without angina pectoris: Secondary | ICD-10-CM | POA: Diagnosis present

## 2020-10-10 DIAGNOSIS — F411 Generalized anxiety disorder: Secondary | ICD-10-CM | POA: Diagnosis present

## 2020-10-10 DIAGNOSIS — Z79899 Other long term (current) drug therapy: Secondary | ICD-10-CM

## 2020-10-10 DIAGNOSIS — Z20822 Contact with and (suspected) exposure to covid-19: Secondary | ICD-10-CM | POA: Diagnosis present

## 2020-10-10 DIAGNOSIS — E785 Hyperlipidemia, unspecified: Secondary | ICD-10-CM | POA: Diagnosis present

## 2020-10-10 DIAGNOSIS — Z87891 Personal history of nicotine dependence: Secondary | ICD-10-CM

## 2020-10-10 DIAGNOSIS — Z8249 Family history of ischemic heart disease and other diseases of the circulatory system: Secondary | ICD-10-CM | POA: Diagnosis not present

## 2020-10-10 DIAGNOSIS — Z888 Allergy status to other drugs, medicaments and biological substances status: Secondary | ICD-10-CM | POA: Diagnosis not present

## 2020-10-10 DIAGNOSIS — G919 Hydrocephalus, unspecified: Secondary | ICD-10-CM | POA: Diagnosis not present

## 2020-10-10 DIAGNOSIS — R278 Other lack of coordination: Secondary | ICD-10-CM | POA: Diagnosis not present

## 2020-10-10 DIAGNOSIS — R42 Dizziness and giddiness: Secondary | ICD-10-CM | POA: Diagnosis not present

## 2020-10-10 LAB — CBC WITH DIFFERENTIAL/PLATELET
Abs Immature Granulocytes: 0.01 10*3/uL (ref 0.00–0.07)
Basophils Absolute: 0.1 10*3/uL (ref 0.0–0.1)
Basophils Relative: 1 %
Eosinophils Absolute: 0.2 10*3/uL (ref 0.0–0.5)
Eosinophils Relative: 4 %
HCT: 42.7 % (ref 39.0–52.0)
Hemoglobin: 14.5 g/dL (ref 13.0–17.0)
Immature Granulocytes: 0 %
Lymphocytes Relative: 27 %
Lymphs Abs: 1.8 10*3/uL (ref 0.7–4.0)
MCH: 31.3 pg (ref 26.0–34.0)
MCHC: 34 g/dL (ref 30.0–36.0)
MCV: 92 fL (ref 80.0–100.0)
Monocytes Absolute: 0.6 10*3/uL (ref 0.1–1.0)
Monocytes Relative: 9 %
Neutro Abs: 3.9 10*3/uL (ref 1.7–7.7)
Neutrophils Relative %: 59 %
Platelets: 247 10*3/uL (ref 150–400)
RBC: 4.64 MIL/uL (ref 4.22–5.81)
RDW: 12.5 % (ref 11.5–15.5)
WBC: 6.6 10*3/uL (ref 4.0–10.5)
nRBC: 0 % (ref 0.0–0.2)

## 2020-10-10 LAB — URINALYSIS, ROUTINE W REFLEX MICROSCOPIC
Bilirubin Urine: NEGATIVE
Glucose, UA: NEGATIVE mg/dL
Hgb urine dipstick: NEGATIVE
Ketones, ur: NEGATIVE mg/dL
Leukocytes,Ua: NEGATIVE
Nitrite: NEGATIVE
Protein, ur: NEGATIVE mg/dL
Specific Gravity, Urine: 1.009 (ref 1.005–1.030)
pH: 7 (ref 5.0–8.0)

## 2020-10-10 LAB — RESP PANEL BY RT-PCR (FLU A&B, COVID) ARPGX2
Influenza A by PCR: NEGATIVE
Influenza B by PCR: NEGATIVE
SARS Coronavirus 2 by RT PCR: NEGATIVE

## 2020-10-10 LAB — RAPID URINE DRUG SCREEN, HOSP PERFORMED
Amphetamines: NOT DETECTED
Barbiturates: NOT DETECTED
Benzodiazepines: NOT DETECTED
Cocaine: NOT DETECTED
Opiates: POSITIVE — AB
Tetrahydrocannabinol: NOT DETECTED

## 2020-10-10 LAB — COMPREHENSIVE METABOLIC PANEL
ALT: 24 U/L (ref 0–44)
AST: 22 U/L (ref 15–41)
Albumin: 4.3 g/dL (ref 3.5–5.0)
Alkaline Phosphatase: 56 U/L (ref 38–126)
Anion gap: 10 (ref 5–15)
BUN: 8 mg/dL (ref 8–23)
CO2: 28 mmol/L (ref 22–32)
Calcium: 8.7 mg/dL — ABNORMAL LOW (ref 8.9–10.3)
Chloride: 99 mmol/L (ref 98–111)
Creatinine, Ser: 0.54 mg/dL — ABNORMAL LOW (ref 0.61–1.24)
GFR, Estimated: >60 mL/min (ref >60)
Glucose, Bld: 109 mg/dL — ABNORMAL HIGH (ref 70–99)
Potassium: 2.6 mmol/L — CL (ref 3.5–5.1)
Sodium: 137 mmol/L (ref 135–145)
Total Bilirubin: 0.4 mg/dL (ref 0.3–1.2)
Total Protein: 7.1 g/dL (ref 6.5–8.1)

## 2020-10-10 LAB — AMMONIA: Ammonia: 12 umol/L (ref 9–35)

## 2020-10-10 LAB — PROTIME-INR
INR: 1 (ref 0.8–1.2)
Prothrombin Time: 13.2 seconds (ref 11.4–15.2)

## 2020-10-10 LAB — ETHANOL: Alcohol, Ethyl (B): <10 mg/dL (ref <10)

## 2020-10-10 LAB — APTT: aPTT: 26 seconds (ref 24–36)

## 2020-10-10 IMAGING — CT CT HEAD W/O CM
3 series · 15 of 47 positions shown, 18 images · non-contrast
Comparison: [DATE]

CLINICAL DATA: Dizziness, history of cancer

EXAM:
CT HEAD WITHOUT CONTRAST
TECHNIQUE: Contiguous axial images were obtained from the base of the skull
through the vertex without intravenous contrast.

[Series 2: head w o · axial · 0.48mm/px · z∈[-12,+123]mm · 9 of 33 slices shown, 12 images]
[im 3/33  brain]
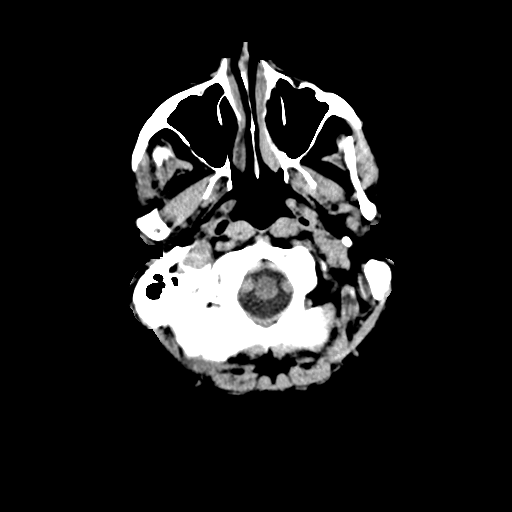
[im 3/33  bone]
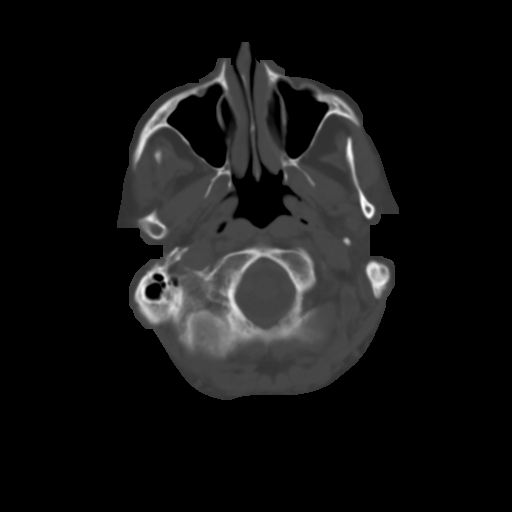
[im 6/33  brain]
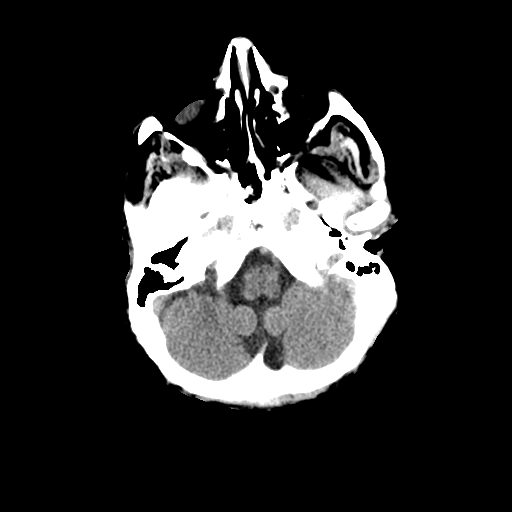
[im 9/33  brain]
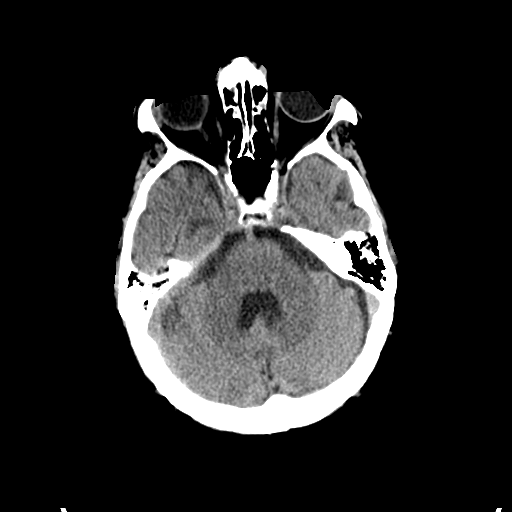
[im 13/33  brain]
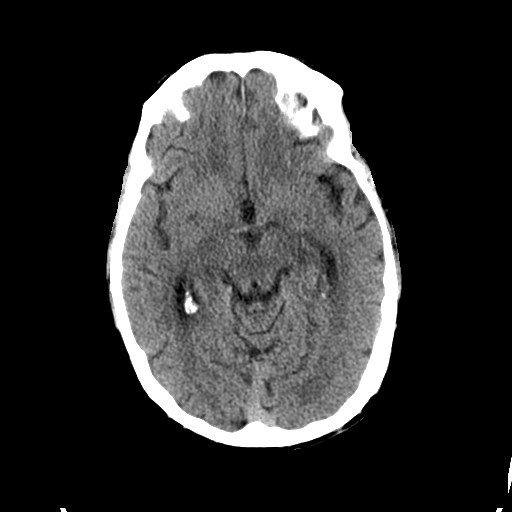
[im 17/33  brain]
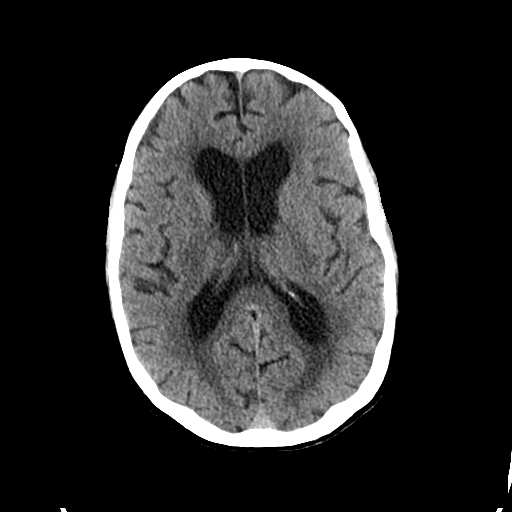
[im 17/33  bone]
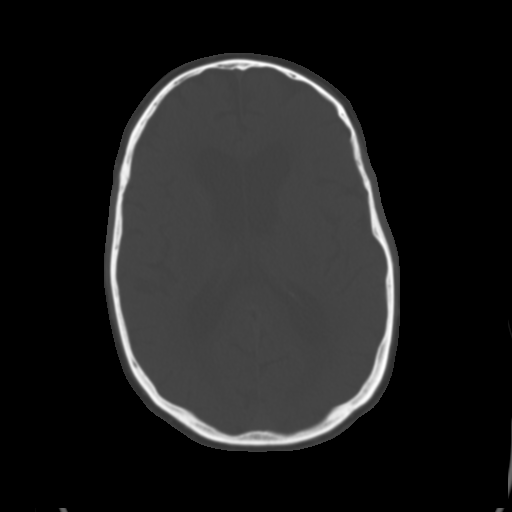
[im 20/33  brain]
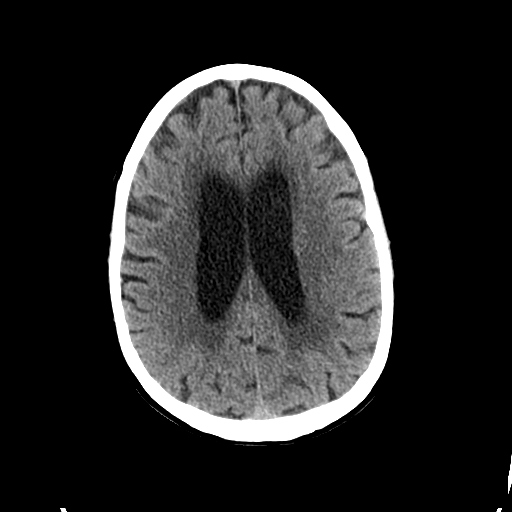
[im 24/33  brain]
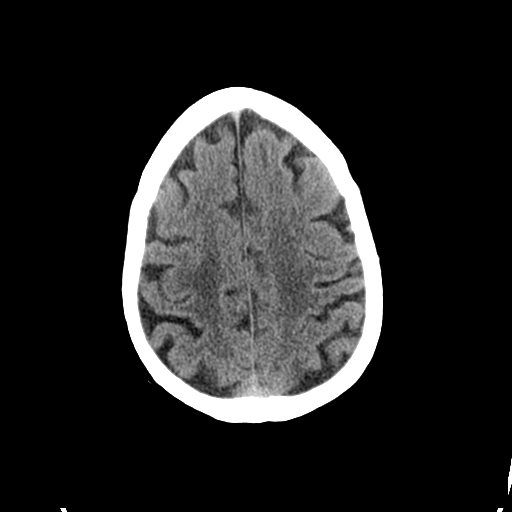
[im 27/33  brain]
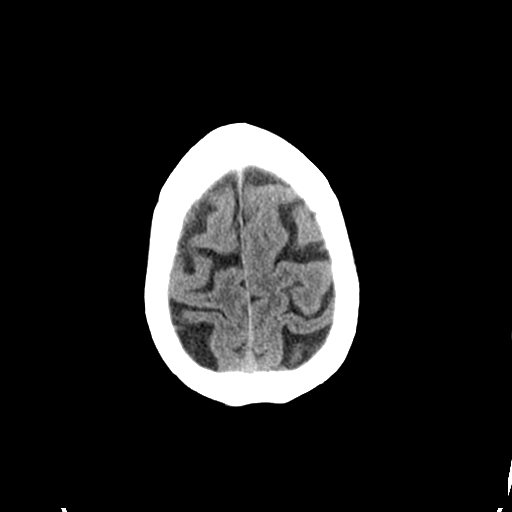
[im 30/33  brain]
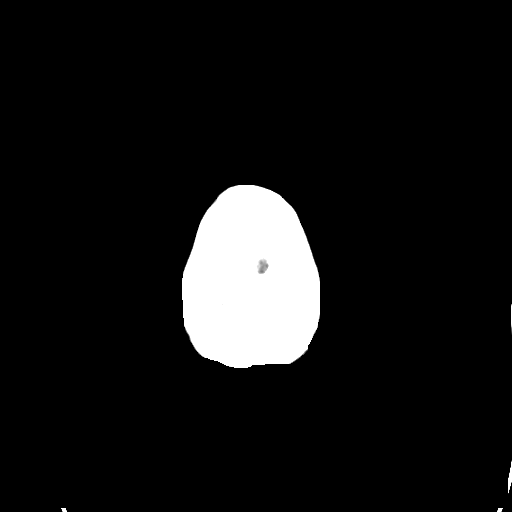
[im 30/33  bone]
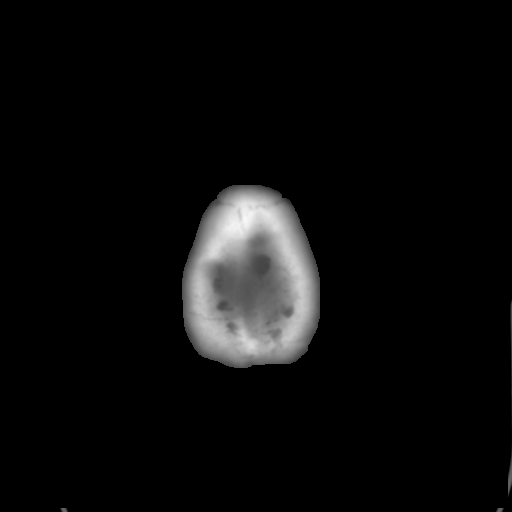

[Series 4: coronal soft · coronal · 0.32mm/px · 3 of 74 slices shown]
[im 25/74  brain]
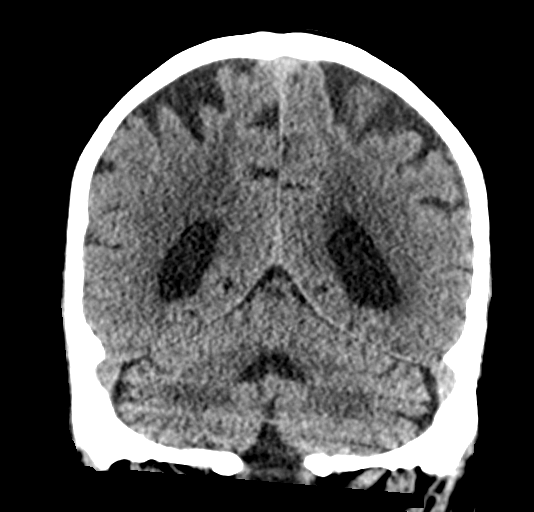
[im 33/74  brain]
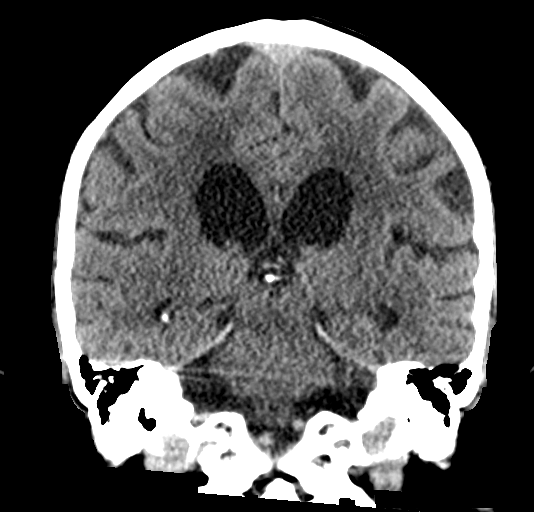
[im 41/74  brain]
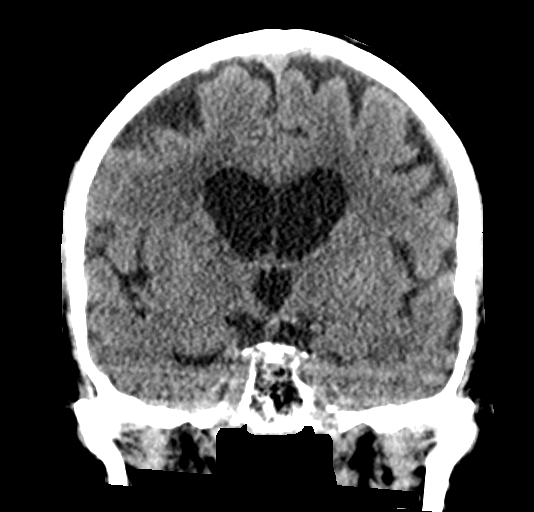

[Series 5: sagittal soft · sagittal · 0.32mm/px · 3 of 55 slices shown]
[im 19/55  brain]
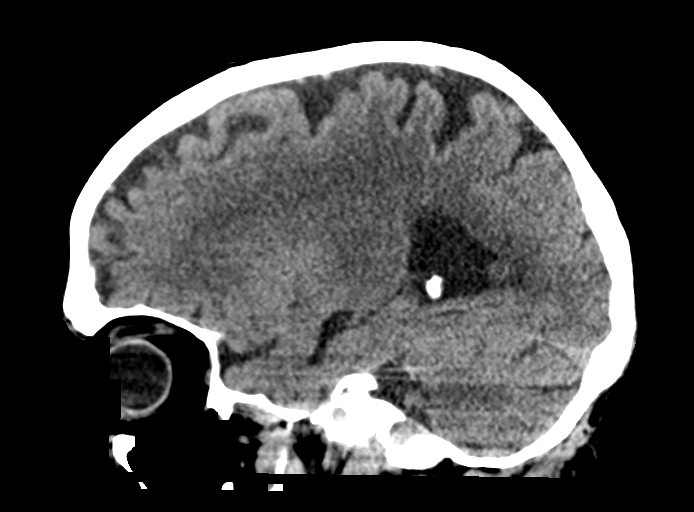
[im 28/55  brain]
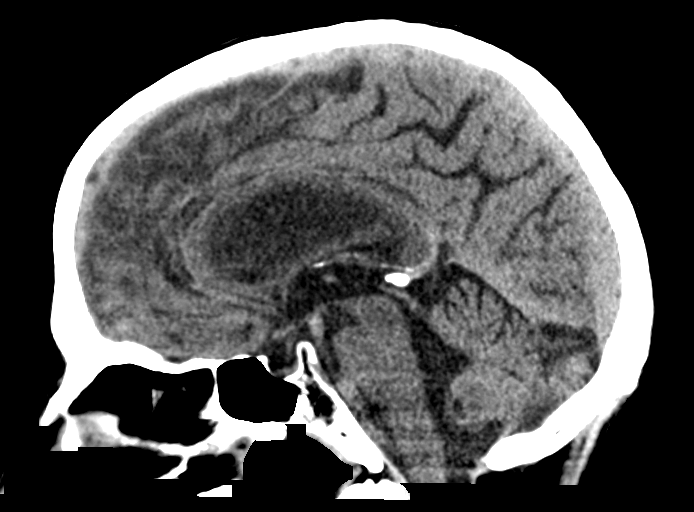
[im 37/55  brain]
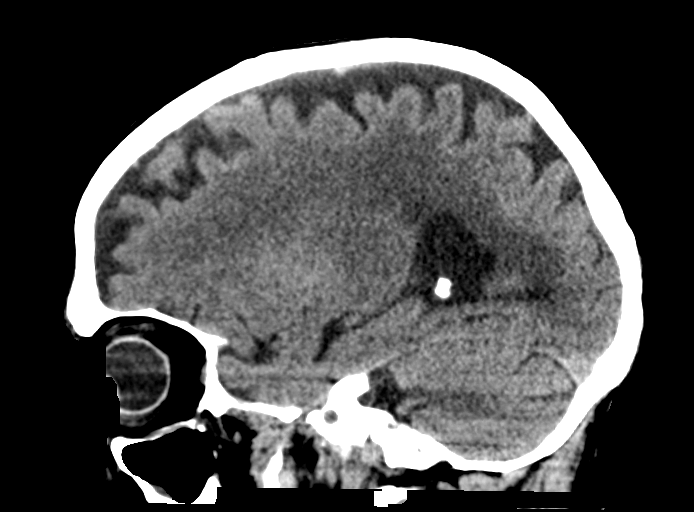

[15 of 47 positions shown; findings below may reference images not displayed]

FINDINGS: Brain: No evidence of acute infarction, hemorrhage, hydrocephalus,
extra-axial collection or mass lesion/mass effect. Periventricular
white matter changes, likely the sequela of chronic small vessel
ischemic disease.

Vascular: No hyperdense vessel or unexpected calcification.

Skull: Normal. Negative for fracture or focal lesion.

Sinuses/Orbits: Mucosal thickening in the left maxillary sinus and
bilateral ethmoid air cells. The orbits are unremarkable.

Other: None.
IMPRESSION: No acute intracranial process.

## 2020-10-10 IMAGING — DX DG CHEST 1V PORT
1 series · 1 of 1 positions shown · non-contrast
Comparison: [DATE]

CLINICAL DATA: Shortness of breath

EXAM:
PORTABLE CHEST 1 VIEW

[chest ap]
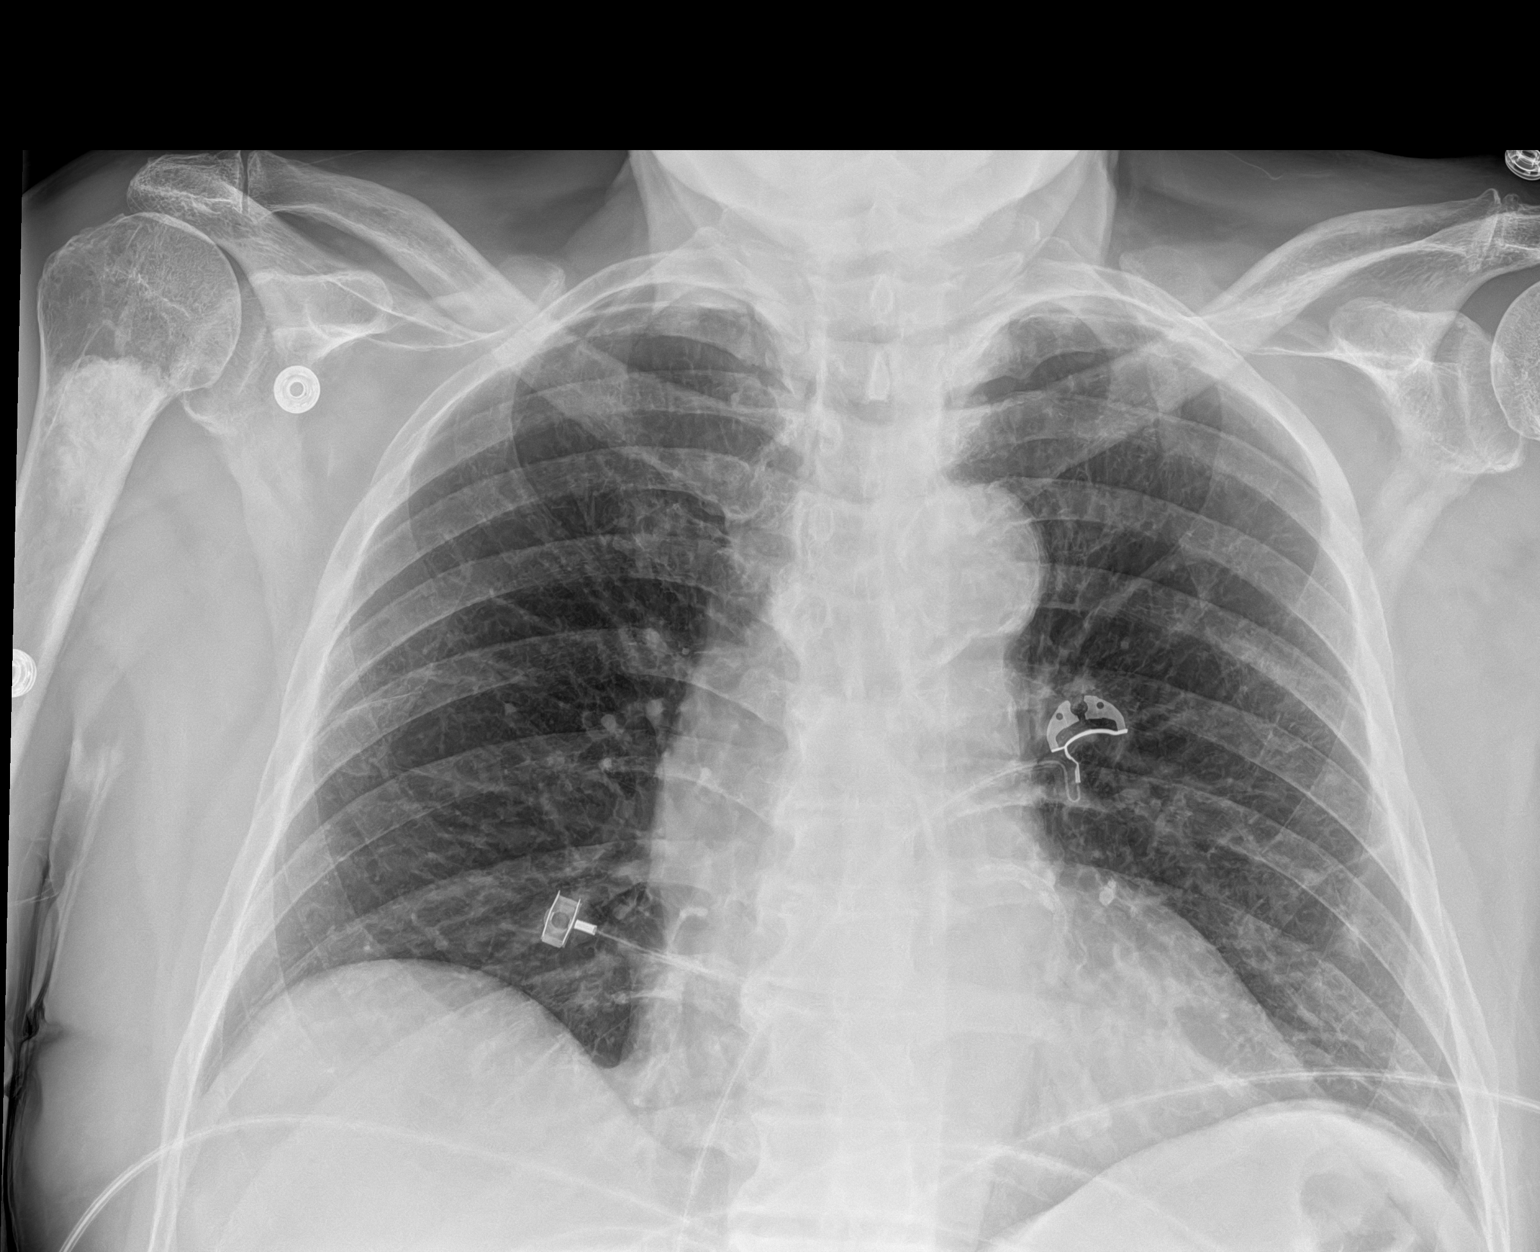

[1 of 1 positions shown; findings below may reference images not displayed]

FINDINGS: Cardiac shadow is mildly prominent but stable. Aortic calcifications
are seen. The lungs are well aerated bilaterally. No focal
infiltrate or effusion is noted. No bony abnormality is noted.
IMPRESSION: No active disease.

## 2020-10-10 MED ORDER — HYDROMORPHONE HCL 2 MG PO TABS
4.0000 mg | ORAL_TABLET | Freq: Three times a day (TID) | ORAL | Status: DC | PRN
  Administered 2020-10-11 – 2020-10-13 (×7): 4 mg via ORAL
  Filled 2020-10-10 (×7): qty 2

## 2020-10-10 MED ORDER — POLYETHYLENE GLYCOL 3350 17 G PO PACK
17.0000 g | PACK | Freq: Every day | ORAL | Status: DC | PRN
  Administered 2020-10-12: 17 g via ORAL
  Filled 2020-10-10: qty 1

## 2020-10-10 MED ORDER — POTASSIUM CHLORIDE 10 MEQ/100ML IV SOLN
10.0000 meq | Freq: Once | INTRAVENOUS | Status: AC
  Administered 2020-10-10: 10 meq via INTRAVENOUS
  Filled 2020-10-10: qty 100

## 2020-10-10 MED ORDER — PROCHLORPERAZINE MALEATE 10 MG PO TABS
10.0000 mg | ORAL_TABLET | Freq: Four times a day (QID) | ORAL | Status: DC | PRN
  Administered 2020-10-12 – 2020-10-13 (×2): 10 mg via ORAL
  Filled 2020-10-10 (×3): qty 1

## 2020-10-10 MED ORDER — DRONABINOL 2.5 MG PO CAPS
2.5000 mg | ORAL_CAPSULE | Freq: Two times a day (BID) | ORAL | Status: DC
  Administered 2020-10-11 – 2020-10-13 (×4): 2.5 mg via ORAL
  Filled 2020-10-10 (×4): qty 1

## 2020-10-10 MED ORDER — LINACLOTIDE 145 MCG PO CAPS
145.0000 ug | ORAL_CAPSULE | Freq: Every day | ORAL | Status: DC
  Administered 2020-10-11 – 2020-10-13 (×3): 145 ug via ORAL
  Filled 2020-10-10 (×3): qty 1

## 2020-10-10 MED ORDER — MAGNESIUM SULFATE 2 GM/50ML IV SOLN
2.0000 g | Freq: Once | INTRAVENOUS | Status: AC
  Administered 2020-10-10: 2 g via INTRAVENOUS
  Filled 2020-10-10: qty 50

## 2020-10-10 MED ORDER — DULOXETINE HCL 60 MG PO CPEP
60.0000 mg | ORAL_CAPSULE | Freq: Every day | ORAL | Status: DC
  Administered 2020-10-11 – 2020-10-13 (×3): 60 mg via ORAL
  Filled 2020-10-10: qty 1
  Filled 2020-10-10: qty 2
  Filled 2020-10-10: qty 1

## 2020-10-10 MED ORDER — LORAZEPAM 2 MG/ML IJ SOLN
0.5000 mg | Freq: Once | INTRAMUSCULAR | Status: DC
Start: 2020-10-10 — End: 2020-10-13

## 2020-10-10 MED ORDER — STROKE: EARLY STAGES OF RECOVERY BOOK
Freq: Once | Status: DC
  Filled 2020-10-10: qty 1

## 2020-10-10 MED ORDER — LACTULOSE 10 GM/15ML PO SOLN: 20.0000 g | ORAL | Status: DC | PRN

## 2020-10-10 MED ORDER — GABAPENTIN 400 MG PO CAPS
400.0000 mg | ORAL_CAPSULE | Freq: Three times a day (TID) | ORAL | Status: DC
  Administered 2020-10-10 – 2020-10-13 (×8): 400 mg via ORAL
  Filled 2020-10-10 (×8): qty 1

## 2020-10-10 MED ORDER — ASPIRIN EC 81 MG PO TBEC
81.0000 mg | DELAYED_RELEASE_TABLET | Freq: Every day | ORAL | Status: DC
  Administered 2020-10-11: 81 mg via ORAL
  Filled 2020-10-10: qty 1

## 2020-10-10 MED ORDER — ABIRATERONE ACETATE 250 MG PO TABS: 1000.0000 mg | ORAL_TABLET | Freq: Every day | ORAL | Status: DC

## 2020-10-10 MED ORDER — POTASSIUM CHLORIDE CRYS ER 20 MEQ PO TBCR
20.0000 meq | EXTENDED_RELEASE_TABLET | Freq: Two times a day (BID) | ORAL | Status: DC
  Administered 2020-10-10 – 2020-10-13 (×6): 20 meq via ORAL
  Filled 2020-10-10 (×6): qty 1

## 2020-10-10 MED ORDER — ENOXAPARIN SODIUM 40 MG/0.4ML IJ SOSY
40.0000 mg | PREFILLED_SYRINGE | INTRAMUSCULAR | Status: DC
  Administered 2020-10-10 – 2020-10-12 (×3): 40 mg via SUBCUTANEOUS
  Filled 2020-10-10 (×3): qty 0.4

## 2020-10-10 MED ORDER — FLUTICASONE PROPIONATE 50 MCG/ACT NA SUSP
2.0000 | Freq: Every day | NASAL | Status: DC
  Administered 2020-10-11 – 2020-10-13 (×3): 2 via NASAL
  Filled 2020-10-10 (×2): qty 16

## 2020-10-10 MED ORDER — ACETAMINOPHEN 650 MG RE SUPP: 650.0000 mg | RECTAL | Status: DC | PRN

## 2020-10-10 MED ORDER — FENTANYL 25 MCG/HR TD PT72: 1.0000 | MEDICATED_PATCH | TRANSDERMAL | Status: DC

## 2020-10-10 MED ORDER — ACETAMINOPHEN 160 MG/5ML PO SOLN: 650.0000 mg | ORAL | Status: DC | PRN

## 2020-10-10 MED ORDER — POTASSIUM CHLORIDE 20 MEQ PO PACK
40.0000 meq | PACK | Freq: Once | ORAL | Status: AC
  Administered 2020-10-10: 40 meq via ORAL
  Filled 2020-10-10: qty 2

## 2020-10-10 MED ORDER — ALPRAZOLAM 0.5 MG PO TABS
0.5000 mg | ORAL_TABLET | Freq: Two times a day (BID) | ORAL | Status: DC | PRN
  Administered 2020-10-10 – 2020-10-13 (×5): 0.5 mg via ORAL
  Filled 2020-10-10 (×5): qty 1

## 2020-10-10 MED ORDER — ACETAMINOPHEN 325 MG PO TABS
650.0000 mg | ORAL_TABLET | ORAL | Status: DC | PRN
  Administered 2020-10-11: 650 mg via ORAL
  Filled 2020-10-10: qty 2

## 2020-10-10 NOTE — ED Triage Notes (Signed)
Pt arrived by [facility] for SOB x 3 days that got worse today. Pt has hx of lung cancer.

## 2020-10-10 NOTE — ED Notes (Signed)
Date and time results received: 10/10/20 4:52 PM  Test: potassium Critical Value: 2.6  Name of Provider Notified: christian, PA  Orders Received? Or Actions Taken?: PA notified

## 2020-10-10 NOTE — ED Provider Notes (Signed)
Medical screening examination/treatment/procedure(s) were conducted as a shared visit with non-physician practitioner(s) and myself.  I personally evaluated the patient during the encounter.  Clinical Impression:   Final diagnoses:  None    Feels near syncopal - can't get enough breath - non exertional - intermittent for 1 week - now with some dizziness and difficulty walking - no head injury - no body injury - no signs of trauma on exam.    Lives with daughter - at home,   Has HTN - very hypertensive hear - 204/86 currently - has some blurred vision with the dizziness.  Hx of Prostate Ca to bones - ? Lungs as he has pulm nodules.  Neuro exam:  balance is off - can't stand without falling.  There is some weakness on the right leg compared to the left, there is some slight weakness of the right grip compared to the left, he has some dysmetria on the right compared to the left - again has had this for days  Reviewed prior MRI  CT, stroke like w/u, control BP.    EKG performed on October 10, 2020 at 4:00 PM shows sinus rhythm, rate of 64 beats a minute, left axis deviation, left anterior fascicular block, normal ST segments, nonspecific T waves, unremarkable EKG but nonspecific  Discussed with Neuro - agrees with stroke w/u and admission - hypoK treated - hosptialist will admit   Noemi Chapel, MD 10/11/20 1226

## 2020-10-10 NOTE — H&P (Signed)
History and Physical  [name]. YQM:[id number] DOB: Aug 09, 1940 DOA: 10/10/2020  Referring physician: Dr Sabra Heck, ED physician PCP: Dettinger, Fransisca Kaufmann, MD  Outpatient Specialists:   Patient Coming From: home  Chief Complaint: fall, feeling off balance  HPI: Keith Olson. is a 80 y.o. 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  Patient seen for shortness of breath that has been going on for the past 3 days with disequilibrium that started approximately 2 to 3 weeks ago.  Patient denies dyspnea on exertion or worsening shortness of breath with activity.  He is unable to specifically talk about his shortness of breath other than that he feels increased effort with breathing.  He does report feeling lightheaded, off balance, dizzy and falling 10-15 times throughout the day.  Patient states that he is falling to the right and has weakness in his right leg.  His symptoms have been worsening over the past 2 to 3 weeks.  He denies vertigo, presyncope, fevers, chills, nausea, vomiting.  Denies chest pain, chest tightness.  Emergency Department Course: CT of the head was negative.  Potassium 2.6.  Creatinine normal.  White blood cell count normal.  UA normal.  UDS positive for opiates, although the patient is on a fentanyl patch and receives oral Dilaudid.  Chest x-ray normal.  Hemoglobin 14.5.  Review of Systems:  Pt denies any fevers, chills, nausea, vomiting, diarrhea, constipation, abdominal pain, shortness of breath, dyspnea on exertion, orthopnea, cough, wheezing, palpitations, headache, vision changes, melena, rectal bleeding.  Review of systems are otherwise negative  Past Medical History:  Diagnosis Date   Agent orange exposure    Arthritis    Coronary artery disease    BMS to mid LAD 2001, DES to proximal LAD 2017   Enlarged prostate    XRT 2016   Headache    Sinus headaches    Heart abnormality    History of depression     Hyperlipidemia    Hypothyroidism    Prostate cancer ([place]) 07/2014   hormonal therapy, external beam radiation therapy   PTSD (post-traumatic stress disorder)    Past Surgical History:  Procedure Laterality Date   BRONCHIAL BRUSHINGS  07/02/2019   Procedure: BRONCHIAL BRUSHINGS;  Surgeon: Rigoberto Noel, MD;  Location: [facility];  Service: Cardiopulmonary;;   BRONCHIAL NEEDLE ASPIRATION BIOPSY  07/02/2019   Procedure: BRONCHIAL NEEDLE ASPIRATION BIOPSIES;  Surgeon: Rigoberto Noel, MD;  [facility]: [facility];  Service: Cardiopulmonary;;   BRONCHIAL WASHINGS  07/02/2019   Procedure: BRONCHIAL WASHINGS;  Surgeon: Rigoberto Noel, MD;  [facility];  Service: Cardiopulmonary;;   CARDIAC CATHETERIZATION N/A 12/12/2015   Procedure: Left Heart Cath and Coronary Angiography;  Surgeon: Peter M Martinique, MD;  Location: [facility];  Service: Cardiovascular;  Laterality: N/A;   CARDIAC CATHETERIZATION N/A 12/12/2015   Procedure: Coronary Stent Intervention;  Surgeon: Peter M Martinique, MD;  Location: [facility];  Service: Cardiovascular;  Laterality: N/A;   CORONARY STENT INTERVENTION N/A 09/06/2017   Procedure: CORONARY STENT INTERVENTION;  Surgeon: Burnell Blanks, MD;  Location: [place];  Service: Cardiovascular;  Laterality: N/A;   ENDOBRONCHIAL ULTRASOUND N/A 07/02/2019   Procedure: ENDOBRONCHIAL ULTRASOUND;  Surgeon: Rigoberto Noel, MD;  [facility];  Service: Cardiopulmonary;  Laterality: N/A;   FLEXIBLE BRONCHOSCOPY  07/02/2019   Procedure: FLEXIBLE BRONCHOSCOPY;  Surgeon: Rigoberto Noel, MD;  [facility];  Service: Cardiopulmonary;;  HERNIA REPAIR Right    LEFT HEART CATH AND CORONARY ANGIOGRAPHY N/A 09/02/2016   Procedure: Left Heart Cath and Coronary Angiography;  Surgeon: Leonie Man, MD;  Location: [place] [facility];  Service: Cardiovascular;  Laterality: N/A;   LEFT HEART CATH AND CORONARY ANGIOGRAPHY N/A 09/06/2017   Procedure:  LEFT HEART CATH AND CORONARY ANGIOGRAPHY;  Surgeon: Burnell Blanks, MD;  Location: [place] [facility];  Service: Cardiovascular;  Laterality: N/A;   LEFT HEART CATH AND CORONARY ANGIOGRAPHY N/A 05/04/2019   Procedure: LEFT HEART CATH AND CORONARY ANGIOGRAPHY;  Surgeon: Martinique, Peter M, MD;  Location: [facility];  Service: Cardiovascular;  Laterality: N/A;   PROSTATE BIOPSY N/A 08/28/2014   Procedure: BIOPSY TRANSRECTAL ULTRASONIC PROSTATE (TUBP);  Surgeon: Rana Snare, MD;  [facility];  Service: Urology;  Laterality: N/A;   TEE WITHOUT CARDIOVERSION N/A 04/08/2020   Procedure: TRANSESOPHAGEAL ECHOCARDIOGRAM (TEE) WITH PROPOFOL;  Surgeon: Arnoldo Lenis, MD;  [facility];  Service: Endoscopy;  Laterality: N/A;   TRANSURETHRAL RESECTION OF PROSTATE N/A 08/28/2014   Procedure: TRANSURETHRAL RESECTION OF THE PROSTATE WITH GYRUS INSTRUMENTS;  Surgeon: Rana Snare, MD;  Location: [facility];  Service: Urology;  Laterality: N/A;   Social History:  reports that he has never smoked. He has quit using smokeless tobacco.  His smokeless tobacco use included chew. He reports that he does not drink alcohol and does not use drugs. Patient lives at home  Allergies  Allergen Reactions   Lipitor [Atorvastatin]     Muscles aches    Family History  Problem Relation Age of Onset   Arthritis Mother    Heart attack Father       Prior to Admission medications   Medication Sig Start Date End Date Taking? Authorizing Provider  abiraterone acetate (ZYTIGA) 250 MG tablet TAKE 4 TABLETS (1,000 MG TOTAL) BY MOUTH DAILY. TAKE ON AN EMPTY STOMACH 1 HOUR BEFORE OR 2 HOURS AFTER A MEAL 06/10/20 06/10/21 Yes Derek Jack, MD  ALPRAZolam Duanne Moron) 0.5 MG tablet Take 1 tablet (0.5 mg total) by mouth 2 (two) times daily as needed for anxiety. 07/25/19  Yes Johnson, Clanford L, MD  aspirin EC 81 MG tablet Take 1 tablet (81 mg total) by mouth daily. 12/09/15  Yes Satira Sark, MD   azithromycin (ZITHROMAX) 250 MG tablet Take 250 mg by mouth once. Take 5 tablets once 10/09/20  Yes [provider]  ceFAZolin (ANCEF) 10 g injection  04/15/20  Yes [provider]  dronabinol (MARINOL) 2.5 MG capsule Take 1 capsule (2.5 mg total) by mouth 2 (two) times daily before a meal. 04/25/20  Yes Derek Jack, MD  DULoxetine (CYMBALTA) 60 MG capsule Take 60 mg by mouth daily. 08/09/19  Yes [provider]  fentaNYL (DURAGESIC) 25 MCG/HR Place 1 patch onto the skin every 3 (three) days. 10/06/20  Yes Derek Jack, MD  gabapentin (NEURONTIN) 400 MG capsule TAKE 1 CAPSULE BY MOUTH THREE TIMES DAILY 07/09/20  Yes Derek Jack, MD  HYDROmorphone (DILAUDID) 4 MG tablet Take 1 tablet (4 mg total) by mouth every 8 (eight) hours as needed for severe pain. 09/22/20  Yes Derek Jack, MD  lactulose (CHRONULAC) 10 GM/15ML solution Take 30 mLs (20 g total) by mouth every 3 (three) hours as needed for mild constipation (take 98m po every 3 hrs until BM). 07/02/20  Yes KDerek Jack MD  linaclotide (Rolan Lipa 145 MCG CAPS capsule Take 145 mcg by mouth daily before breakfast. 07/02/20  Yes [provider]  lisinopril (ZESTRIL) 10 MG tablet Take 1 tablet by mouth once daily 09/02/20  Yes Pennington, Rebekah M, PA-C  NARCAN 4 MG/0.1ML LIQD nasal spray kit Place 1 spray into the nose once. 08/10/19  Yes [provider]  nitroGLYCERIN (NITROSTAT) 0.4 MG SL tablet Place 1 tablet (0.4 mg total) under the tongue every 5 (five) minutes as needed for chest pain. X 3 doses 09/07/17  Yes Bhagat, Bhavinkumar, PA  polyethylene glycol powder (GLYCOLAX/MIRALAX) 17 GM/SCOOP powder Take 17 g by mouth daily as needed for mild constipation or moderate constipation.    Yes [provider]  prochlorperazine (COMPAZINE) 10 MG tablet Take 1 tablet (10 mg total) by mouth every 6 (six) hours as needed for nausea or vomiting. Patient taking differently:  Take by mouth every 6 (six) hours as needed for nausea or vomiting. 07/10/20  Yes Derek Jack, MD  cetirizine (ZYRTEC) 10 MG tablet Take 10 mg by mouth daily. Patient not taking: No sig reported 12/26/19   [provider]  diclofenac sodium (VOLTAREN) 1 % GEL APPLY 2 GRAMS TOPICALLY 4 TIMES DAILY Patient not taking: No sig reported 12/21/18   Dettinger, Fransisca Kaufmann, MD  fluticasone (FLONASE) 50 MCG/ACT nasal spray Place 2 sprays into both nostrils daily. Patient not taking: No sig reported 12/26/19   [provider]  pantoprazole (PROTONIX) 40 MG tablet Take 1 tablet (40 mg total) by mouth daily. Patient not taking: No sig reported 07/06/19   Rai, Ripudeep K, MD  predniSONE (DELTASONE) 5 MG tablet TAKE 1 TABLET (5 MG TOTAL) BY MOUTH DAILY WITH BREAKFAST. Patient not taking: No sig reported 05/06/20   Derek Jack, MD  tamsulosin (FLOMAX) 0.4 MG CAPS capsule Take 0.4 mg by mouth daily. Patient not taking: No sig reported 08/04/19   [provider]  traZODone (DESYREL) 50 MG tablet Take 50 mg by mouth at bedtime as needed. Patient not taking: No sig reported 10/09/20   [provider]    Physical Exam: BP (!) 184/89   Pulse 61   Temp 98.4 F (36.9 C) (Oral)   Resp 16   Ht _0  (1.676 m)   Wt 68.9 kg   SpO2 99%   BMI 24.53 kg/m   General: Elderly male. Awake and alert and oriented x3. No acute cardiopulmonary distress.  HEENT: Normocephalic atraumatic.  Right and left ears normal in appearance.  Pupils equal, round, reactive to light. Extraocular muscles are intact. Sclerae anicteric and noninjected.  Moist mucosal membranes. No mucosal lesions.  Neck: Neck supple without lymphadenopathy. No carotid bruits. No masses palpated.  Cardiovascular: Regular rate with normal S1-S2 sounds. No murmurs, rubs, gallops auscultated. No JVD.  Respiratory: Good respiratory effort with no wheezes, rales, rhonchi. Lungs clear to auscultation bilaterally.  No  accessory muscle use. Abdomen: Soft, nontender, nondistended. Active bowel sounds. No masses or hepatosplenomegaly  Skin: No rashes, lesions, or ulcerations.  Dry, warm to touch. 2+ dorsalis pedis and radial pulses. Musculoskeletal: No calf or leg pain. All major joints not erythematous nontender.  No upper or lower joint deformation.  Good ROM.  No contractures  Psychiatric: Intact judgment and insight. Pleasant and cooperative. Neurologic: Slight weakness in grip strength on the right compared to the left.  Mild dysmetria on the right compared to the left.  Decreased sensation to light touch in the right lower leg in the L4 dermatome. Cranial nerves II through XII are grossly intact.  Labs on Admission: I have personally reviewed following labs and imaging studies  CBC: Recent Labs  Lab 10/10/20 1558  WBC 6.6  NEUTROABS 3.9  HGB 14.5  HCT 42.7  MCV 92.0  PLT 161   Basic Metabolic Panel: Recent Labs  Lab 10/10/20 1558  NA 137  K 2.6*  CL 99  CO2 28  GLUCOSE 109*  BUN 8  CREATININE 0.54*  CALCIUM 8.7*   GFR: Estimated Creatinine Clearance: 68.7 mL/min (A) (by C-G formula based on SCr of 0.54 mg/dL (L)). Liver Function Tests: Recent Labs  Lab 10/10/20 1558  AST 22  ALT 24  ALKPHOS 56  BILITOT 0.4  PROT 7.1  ALBUMIN 4.3   No results for input(s): LIPASE, AMYLASE in the last 168 hours. Recent Labs  Lab 10/10/20 1558  AMMONIA 12   Coagulation Profile: Recent Labs  Lab 10/10/20 1558  INR 1.0   Cardiac Enzymes: No results for input(s): CKTOTAL, CKMB, CKMBINDEX, TROPONINI in the last 168 hours. BNP (last 3 results) No results for input(s): PROBNP in the last 8760 hours. HbA1C: No results for input(s): HGBA1C in the last 72 hours. CBG: No results for input(s): GLUCAP in the last 168 hours. Lipid Profile: No results for input(s): CHOL, HDL, LDLCALC, TRIG, CHOLHDL, LDLDIRECT in the last 72 hours. Thyroid Function Tests: No results for input(s):  TSH, T4TOTAL, FREET4, T3FREE, THYROIDAB in the last 72 hours. Anemia Panel: No results for input(s): VITAMINB12, FOLATE, FERRITIN, TIBC, IRON, RETICCTPCT in the last 72 hours. Urine analysis:    Component Value Date/Time   COLORURINE YELLOW 10/10/2020 1600   APPEARANCEUR CLEAR 10/10/2020 1600   APPEARANCEUR Clear 05/22/2018 1547   LABSPEC 1.009 10/10/2020 1600   PHURINE 7.0 10/10/2020 1600   GLUCOSEU NEGATIVE 10/10/2020 1600   HGBUR NEGATIVE 10/10/2020 1600   BILIRUBINUR NEGATIVE 10/10/2020 1600   BILIRUBINUR Negative 05/22/2018 1547   KETONESUR NEGATIVE 10/10/2020 1600   PROTEINUR NEGATIVE 10/10/2020 1600   UROBILINOGEN negative 03/07/2014 1059   NITRITE NEGATIVE 10/10/2020 1600   LEUKOCYTESUR NEGATIVE 10/10/2020 1600   Sepsis Labs: _0 (procalcitonin:4,lacticidven:4) ) Recent Results (from the past 240 hour(s))  Resp Panel by RT-PCR (Flu A&B, Covid) Nasopharyngeal Swab     Status: None   Collection Time: 10/10/20  4:00 PM   Specimen: Nasopharyngeal Swab; Nasopharyngeal(NP) swabs in vial transport medium  Result Value Ref Range Status   SARS Coronavirus 2 by RT PCR NEGATIVE NEGATIVE Final    Comment: (NOTE) SARS-CoV-2 target nucleic acids are NOT DETECTED.  The SARS-CoV-2 RNA is generally detectable in upper respiratory specimens during the acute phase of infection. The lowest concentration of SARS-CoV-2 viral copies this assay can detect is 138 copies/mL. A negative result does not preclude SARS-Cov-2 infection and should not be used as the sole basis for treatment or other patient management decisions. A negative result may occur with  improper specimen collection/handling, submission of specimen other than nasopharyngeal swab, presence of viral mutation(s) within the areas targeted by this assay, and inadequate number of viral copies(<138 copies/mL). A negative result must be combined with clinical observations, patient history, and epidemiological information.  The expected result is Negative.  Fact Sheet for Patients:  EntrepreneurPulse.com.au  Fact Sheet for Healthcare Providers:  IncredibleEmployment.be  This test is no t yet approved or cleared by the [place] FDA and  has been authorized for detection and/or diagnosis of SARS-CoV-2 by FDA under an Emergency Use Authorization (EUA). This EUA will remain  in effect (meaning this test can be used)  for the duration of the COVID-19 declaration under Section 564(b)(1) of the Act, 21 U.S.C.section 360bbb-3(b)(1), unless the authorization is terminated  or revoked sooner.       Influenza A by PCR NEGATIVE NEGATIVE Final   Influenza B by PCR NEGATIVE NEGATIVE Final    Comment: (NOTE) The Xpert Xpress SARS-CoV-2/FLU/RSV plus assay is intended as an aid in the diagnosis of influenza from Nasopharyngeal swab specimens and should not be used as a sole basis for treatment. Nasal washings and aspirates are unacceptable for Xpert Xpress SARS-CoV-2/FLU/RSV testing.  Fact Sheet for Patients: EntrepreneurPulse.com.au  Fact Sheet for Healthcare Providers: IncredibleEmployment.be  This test is not yet approved or cleared by the [place] FDA and has been authorized for detection and/or diagnosis of SARS-CoV-2 by FDA under an Emergency Use Authorization (EUA). This EUA will remain in effect (meaning this test can be used) for the duration of the COVID-19 declaration under Section 564(b)(1) of the Act, 21 U.S.C. section 360bbb-3(b)(1), unless the authorization is terminated or revoked.  Performed at [facility], [address] [place], [place] [address]      Radiological Exams on Admission: CT HEAD WO CONTRAST  Result Date: 10/10/2020 CLINICAL DATA:  Dizziness, history of cancer EXAM: CT HEAD WITHOUT CONTRAST TECHNIQUE: Contiguous axial images were obtained from the base of the skull through the vertex without  intravenous contrast. COMPARISON:  04/04/2020 FINDINGS: Brain: No evidence of acute infarction, hemorrhage, hydrocephalus, extra-axial collection or mass lesion/mass effect. Periventricular white matter changes, likely the sequela of chronic small vessel ischemic disease. Vascular: No hyperdense vessel or unexpected calcification. Skull: Normal. Negative for fracture or focal lesion. Sinuses/Orbits: Mucosal thickening in the left maxillary sinus and bilateral ethmoid air cells. The orbits are unremarkable. Other: None. IMPRESSION: No acute intracranial process. Electronically Signed   By: Merilyn Baba M.D.   On: 10/10/2020 16:41   DG Chest Portable 1 View  Result Date: 10/10/2020 CLINICAL DATA:  Shortness of breath EXAM: PORTABLE CHEST 1 VIEW COMPARISON:  04/04/2020 FINDINGS: Cardiac shadow is mildly prominent but stable. Aortic calcifications are seen. The lungs are well aerated bilaterally. No focal infiltrate or effusion is noted. No bony abnormality is noted. IMPRESSION: No active disease. Electronically Signed   By: Inez Catalina M.D.   On: 10/10/2020 17:21    EKG: Independently reviewed.  Pending  Assessment/Plan: Principal Problem:   Fall Active Problems:   Prostate cancer metastatic to bone [facility])   Anxiety state   Hypothyroidism   Hypokalemia   Dysmetria    This patient was discussed with the ED physician, including pertinent vitals, physical exam findings, labs, and imaging.  We also discussed care given by the ED provider.  Falls secondary to dysmetria EDP discussed patient with neurology who recommended stroke work-up.  We will send patient to [facility] for evaluation. Observation on telemetry MRI/MRA head Carotid Dopplers  Echocardiogram tomorrow Hemoglobin A1c, lipid panel in the morning PT/OT/speech therapy consult Full aspirin Prostate cancer with metastatic disease to the bones  Hypothyroidism Patient not currently on levothyroxine.  Last TSH was  approximately a year ago and was normal off medication Check TSH Hypertension Permissive hypertension.  Hold antihypertensives Hypokalemia Replace potassium and recheck in the morning   DVT prophylaxis: Lovenox Consultants: None Code Status: DNR Family Communication: None Disposition Plan: Pending   Truett Mainland, DO

## 2020-10-10 NOTE — ED Notes (Signed)
Pt given urinal.

## 2020-10-10 NOTE — ED Provider Notes (Signed)
[facility] EMERGENCY DEPARTMENT Provider Note   CSN: [id number] Arrival date & time: 10/10/20  1337     History Chief Complaint  Patient presents with   Shortness of Breath    Keith Olson. is a 80 y.o. male with a PMHx of HTN, prostate cancer with metastases, CAD, presents via EMS with one week of intermittent shortness of breath that worsened today. Patient reports SOB unassociated with activity, equally bad if he is sitting or walking around. Patient reports he has not had trouble with work of breathing, inspiratory effort. Patient also endorses dizziness with standing in the same time frame, reporting he has fallen 10-15 times. Patient denies injury to head or extremities with falling. Patient reports he lives at home with his daughter who is not always around. He reports that his vision feels fuzzy and is associated with his dizziness. He denies chest pain, chest tightness. Patient denies sore throat, cough, hemoptysis, sputum production, recent illness, or sick contacts. Patient does endorse urinary hesitancy, however he reports this is not a new onset problem. Patient takes Lisinopril for his HTN and reports he has been compliant with his medication and has not missed any doses. The patient also has prescriptions for benzodiazepines, fentanyl, and lactulose.  Additional history gathered on conversation with Dr. Sabra Heck who also evaluated this patient. Patient reports he has had difficulty writing with his right hand, and has felt more weak in his right leg than in his left. Patient reports when he falls, he usually falls to the right side.   Shortness of Breath     Past Medical History:  Diagnosis Date   Agent orange exposure    Arthritis    Coronary artery disease    BMS to mid LAD 2001, DES to proximal LAD 2017   Enlarged prostate    XRT 2016   Headache    Sinus headaches    Heart abnormality    History of depression    Hyperlipidemia    Hypothyroidism    Prostate  cancer ([place]) 07/2014   hormonal therapy, external beam radiation therapy   PTSD (post-traumatic stress disorder)     Patient Active Problem List   Diagnosis Date Noted   Fall 10/10/2020   Bacteremia due to Staphylococcus aureus 04/09/2020   Staphylococcus aureus bacteremia 04/07/2020   Community acquired pneumonia 04/04/2020   DNR (do not resuscitate) 04/04/2020   Acute bacterial sinusitis 04/04/2020   Leukocytosis 04/04/2020   Weakness 07/22/2019   Acute lower UTI 07/22/2019   Protein calorie malnutrition ([place]) 07/22/2019   Urinary retention 07/18/2019   Hyponatremia 06/29/2019   Acute bilateral low back pain with sciatica    Multiple lung nodules on CT    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/2019   Unstable angina ([place])    History of depression 09/05/2017   Hypothyroidism 09/05/2017   Depression, major, single episode, moderate ([place]) 11/26/2016   Prostatitis 11/26/2016   Rectal bleeding 02/25/2016   Pain management contract signed 01/27/2016   Constipation 04/22/2015   Lower abdominal pain 04/22/2015   Anxiety state 12/16/2014   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   Hyperlipidemia 05/22/2012   BPH (benign prostatic hyperplasia) 05/22/2012   Chest pain 02/24/2012   CAD (coronary artery disease) 02/24/2012    Past Surgical History:  Procedure Laterality Date   BRONCHIAL BRUSHINGS  07/02/2019   Procedure: BRONCHIAL BRUSHINGS;  Surgeon: Rigoberto Noel, MD;  Location: [facility];  Service: Cardiopulmonary;;   BRONCHIAL NEEDLE ASPIRATION BIOPSY  07/02/2019   Procedure: BRONCHIAL NEEDLE ASPIRATION BIOPSIES;  Surgeon: Rigoberto Noel, MD;  Location: [facility];  Service: Cardiopulmonary;;   BRONCHIAL WASHINGS  07/02/2019   Procedure: BRONCHIAL WASHINGS;  Surgeon: Rigoberto Noel, MD;  Location: [facility];  Service: Cardiopulmonary;;   CARDIAC CATHETERIZATION N/A  12/12/2015   Procedure: Left Heart Cath and Coronary Angiography;  Surgeon: Peter M Martinique, MD;  Location: [place] [facility];  Service: Cardiovascular;  Laterality: N/A;   CARDIAC CATHETERIZATION N/A 12/12/2015   Procedure: Coronary Stent Intervention;  Surgeon: Peter M Martinique, MD;  Location: [place] [facility];  Service: Cardiovascular;  Laterality: N/A;   CORONARY STENT INTERVENTION N/A 09/06/2017   Procedure: CORONARY STENT INTERVENTION;  Surgeon: Burnell Blanks, MD;  Location: [place] [facility];  Service: Cardiovascular;  Laterality: N/A;   ENDOBRONCHIAL ULTRASOUND N/A 07/02/2019   Procedure: ENDOBRONCHIAL ULTRASOUND;  Surgeon: Rigoberto Noel, MD;  [facility];  Service: Cardiopulmonary;  Laterality: N/A;   FLEXIBLE BRONCHOSCOPY  07/02/2019   Procedure: FLEXIBLE BRONCHOSCOPY;  Surgeon: Rigoberto Noel, MD;  [facility];  Service: Cardiopulmonary;;   HERNIA REPAIR Right    LEFT HEART CATH AND CORONARY ANGIOGRAPHY N/A 09/02/2016   Procedure: Left Heart Cath and Coronary Angiography;  Surgeon: Leonie Man, MD;  Location: [place] [facility];  Service: Cardiovascular;  Laterality: N/A;   LEFT HEART CATH AND CORONARY ANGIOGRAPHY N/A 09/06/2017   Procedure: LEFT HEART CATH AND CORONARY ANGIOGRAPHY;  Surgeon: Burnell Blanks, MD;  Location: [place] [facility];  Service: Cardiovascular;  Laterality: N/A;   LEFT HEART CATH AND CORONARY ANGIOGRAPHY N/A 05/04/2019   Procedure: LEFT HEART CATH AND CORONARY ANGIOGRAPHY;  Surgeon: Martinique, Peter M, MD;  Location: [place] [facility];  Service: Cardiovascular;  Laterality: N/A;   PROSTATE BIOPSY N/A 08/28/2014   Procedure: BIOPSY TRANSRECTAL ULTRASONIC PROSTATE (TUBP);  Surgeon: Rana Snare, MD;  Location: [facility];  Service: Urology;  Laterality: N/A;   TEE WITHOUT CARDIOVERSION N/A 04/08/2020   Procedure: TRANSESOPHAGEAL ECHOCARDIOGRAM (TEE) WITH PROPOFOL;  Surgeon: Arnoldo Lenis, MD;  [facility];  Service:  Endoscopy;  Laterality: N/A;   TRANSURETHRAL RESECTION OF PROSTATE N/A 08/28/2014   Procedure: TRANSURETHRAL RESECTION OF THE PROSTATE WITH GYRUS INSTRUMENTS;  Surgeon: Rana Snare, MD;  Location: [facility];  Service: Urology;  Laterality: N/A;       Family History  Problem Relation Age of Onset   Arthritis Mother    Heart attack Father     Social History   Tobacco Use   Smoking status: Never   Smokeless tobacco: Former    Types: Nurse, children's Use: Never used  Substance Use Topics   Alcohol use: No   Drug use: No    Home Medications Prior to Admission medications   Medication Sig Start Date End Date Taking? Authorizing Provider  abiraterone acetate (ZYTIGA) 250 MG tablet TAKE 4 TABLETS (1,000 MG TOTAL) BY MOUTH DAILY. TAKE ON AN EMPTY STOMACH 1 HOUR BEFORE OR 2 HOURS AFTER A MEAL 06/10/20 06/10/21 Yes Derek Jack, MD  ALPRAZolam Duanne Moron) 0.5 MG tablet Take 1 tablet (0.5 mg total) by mouth 2 (two) times daily as needed for anxiety. 07/25/19  Yes Johnson, Clanford L, MD  aspirin EC 81 MG tablet Take 1 tablet (81 mg total) by mouth daily. 12/09/15  Yes Satira Sark, MD  azithromycin [facility]) 250  MG tablet Take 250 mg by mouth once. Take 5 tablets once 10/09/20  Yes [provider]  ceFAZolin (ANCEF) 10 g injection  04/15/20  Yes [provider]  dronabinol (MARINOL) 2.5 MG capsule Take 1 capsule (2.5 mg total) by mouth 2 (two) times daily before a meal. 04/25/20  Yes Derek Jack, MD  DULoxetine (CYMBALTA) 60 MG capsule Take 60 mg by mouth daily. 08/09/19  Yes [provider]  fentaNYL (DURAGESIC) 25 MCG/HR Place 1 patch onto the skin every 3 (three) days. 10/06/20  Yes Derek Jack, MD  gabapentin (NEURONTIN) 400 MG capsule TAKE 1 CAPSULE BY MOUTH THREE TIMES DAILY 07/09/20  Yes Derek Jack, MD  HYDROmorphone (DILAUDID) 4 MG tablet Take 1 tablet (4 mg total) by mouth every 8 (eight) hours as needed for severe  pain. 09/22/20  Yes Derek Jack, MD  lactulose (CHRONULAC) 10 GM/15ML solution Take 30 mLs (20 g total) by mouth every 3 (three) hours as needed for mild constipation (take 71m po every 3 hrs until BM). 07/02/20  Yes KDerek Jack MD  linaclotide (Rolan Lipa 145 MCG CAPS capsule Take 145 mcg by mouth daily before breakfast. 07/02/20  Yes [provider]  lisinopril (ZESTRIL) 10 MG tablet Take 1 tablet by mouth once daily 09/02/20  Yes Pennington, Rebekah M, PA-C  NARCAN 4 MG/0.1ML LIQD nasal spray kit Place 1 spray into the nose once. 08/10/19  Yes [provider]  nitroGLYCERIN (NITROSTAT) 0.4 MG SL tablet Place 1 tablet (0.4 mg total) under the tongue every 5 (five) minutes as needed for chest pain. X 3 doses 09/07/17  Yes Bhagat, Bhavinkumar, PA  polyethylene glycol powder (GLYCOLAX/MIRALAX) 17 GM/SCOOP powder Take 17 g by mouth daily as needed for mild constipation or moderate constipation.    Yes [provider]  prochlorperazine (COMPAZINE) 10 MG tablet Take 1 tablet (10 mg total) by mouth every 6 (six) hours as needed for nausea or vomiting. Patient taking differently: Take by mouth every 6 (six) hours as needed for nausea or vomiting. 07/10/20  Yes KDerek Jack MD  cetirizine (ZYRTEC) 10 MG tablet Take 10 mg by mouth daily. Patient not taking: No sig reported 12/26/19   [provider]  diclofenac sodium (VOLTAREN) 1 % GEL APPLY 2 GRAMS TOPICALLY 4 TIMES DAILY Patient not taking: No sig reported 12/21/18   Dettinger, JFransisca Kaufmann MD  fluticasone (FLONASE) 50 MCG/ACT nasal spray Place 2 sprays into both nostrils daily. Patient not taking: No sig reported 12/26/19   [provider]  pantoprazole (PROTONIX) 40 MG tablet Take 1 tablet (40 mg total) by mouth daily. Patient not taking: No sig reported 07/06/19   Rai, Ripudeep K, MD  predniSONE (DELTASONE) 5 MG tablet TAKE 1 TABLET (5 MG TOTAL) BY MOUTH DAILY WITH BREAKFAST. Patient not  taking: No sig reported 05/06/20   KDerek Jack MD  tamsulosin (FLOMAX) 0.4 MG CAPS capsule Take 0.4 mg by mouth daily. Patient not taking: No sig reported 08/04/19   [provider]  traZODone (DESYREL) 50 MG tablet Take 50 mg by mouth at bedtime as needed. Patient not taking: No sig reported 10/09/20   [provider]    Allergies    Lipitor [atorvastatin]  Review of Systems   Review of Systems  Respiratory:  Positive for shortness of breath.   All other systems reviewed and are negative.  Physical Exam Updated Vital Signs BP (!) 184/89   Pulse 61   Temp 98.4 F (36.9 C) (Oral)  Resp 16   Ht 5' 6" (1.676 m)   Wt 68.9 kg   SpO2 99%   BMI 24.53 kg/m   Physical Exam Vitals and nursing note reviewed.  Constitutional:      General: He is not in acute distress. HENT:     Head: Normocephalic and atraumatic.  Eyes:     General:        Right eye: No discharge.        Left eye: No discharge.     Extraocular Movements: Extraocular movements intact.     Conjunctiva/sclera: Conjunctivae normal.     Pupils: Pupils are equal, round, and reactive to light.  Cardiovascular:     Rate and Rhythm: Normal rate and regular rhythm.     Heart sounds: No murmur heard.   No friction rub. No gallop.  Pulmonary:     Effort: Pulmonary effort is normal.     Comments: Some occasional expiratory wheezes Abdominal:     General: Bowel sounds are normal.     Palpations: Abdomen is soft.  Musculoskeletal:     Cervical back: No rigidity.     Comments: Strength weaker in right leg than left leg. 5/5 in left leg. Difficult to assess lateral discrepancy in shoulders due to pain in left shoulder compared to right.  Skin:    General: Skin is warm and dry.     Capillary Refill: Capillary refill takes less than 2 seconds.  Neurological:     Mental Status: He is alert and oriented to person, place, and time.     Cranial Nerves: No cranial nerve deficit.     Sensory: No sensory  deficit.     Motor: Weakness present.     Gait: Gait abnormal (Patient did not pass romberg, and reports he falls to the right when he falls over recently).  Psychiatric:        Mood and Affect: Mood normal.        Behavior: Behavior normal.    ED Results / Procedures / Treatments   Labs (all labs ordered are listed, but only abnormal results are displayed) Labs Reviewed  COMPREHENSIVE METABOLIC PANEL - Abnormal; Notable for the following components:      Result Value   Potassium 2.6 (*)    Glucose, Bld 109 (*)    Creatinine, Ser 0.54 (*)    Calcium 8.7 (*)    All other components within normal limits  RAPID URINE DRUG SCREEN, HOSP PERFORMED - Abnormal; Notable for the following components:   Opiates POSITIVE (*)    All other components within normal limits  RESP PANEL BY RT-PCR (FLU A&B, COVID) ARPGX2  ETHANOL  PROTIME-INR  APTT  URINALYSIS, ROUTINE W REFLEX MICROSCOPIC  CBC WITH DIFFERENTIAL/PLATELET  AMMONIA  CBC  DIFFERENTIAL    EKG None  Radiology CT HEAD WO CONTRAST  Result Date: 10/10/2020 CLINICAL DATA:  Dizziness, history of cancer EXAM: CT HEAD WITHOUT CONTRAST TECHNIQUE: Contiguous axial images were obtained from the base of the skull through the vertex without intravenous contrast. COMPARISON:  04/04/2020 FINDINGS: Brain: No evidence of acute infarction, hemorrhage, hydrocephalus, extra-axial collection or mass lesion/mass effect. Periventricular white matter changes, likely the sequela of chronic small vessel ischemic disease. Vascular: No hyperdense vessel or unexpected calcification. Skull: Normal. Negative for fracture or focal lesion. Sinuses/Orbits: Mucosal thickening in the left maxillary sinus and bilateral ethmoid air cells. The orbits are unremarkable. Other: None. IMPRESSION: No acute intracranial process. Electronically Signed   By: Francetta Found.D.  On: 10/10/2020 16:41   DG Chest Portable 1 View  Result Date: 10/10/2020 CLINICAL DATA:  Shortness  of breath EXAM: PORTABLE CHEST 1 VIEW COMPARISON:  04/04/2020 FINDINGS: Cardiac shadow is mildly prominent but stable. Aortic calcifications are seen. The lungs are well aerated bilaterally. No focal infiltrate or effusion is noted. No bony abnormality is noted. IMPRESSION: No active disease. Electronically Signed   By: Inez Catalina M.D.   On: 10/10/2020 17:21    Procedures Procedures   Medications Ordered in ED Medications  magnesium sulfate IVPB 2 g 50 mL (0 g Intravenous Stopped 10/10/20 1806)  potassium chloride 10 mEq in 100 mL IVPB (0 mEq Intravenous Stopped 10/10/20 1806)  potassium chloride (KLOR-CON) packet 40 mEq (40 mEq Oral Given 10/10/20 1719)    ED Course  I have reviewed the triage vital signs and the nursing notes.  Pertinent labs & imaging results that were available during my care of the patient were reviewed by me and considered in my medical decision making (see chart for details).  Clinical Course as of 10/10/20 1901  Fri Oct 10, 2020  1719 Potassium(!!): 2.6 Potassium and Magnesium repleted [CP]  1851 Opiates(!): POSITIVE Patient has prescription for opiates on file [CP]    Clinical Course User Index [CP] Arnet Hofferber H, PA-C   MDM Rules/Calculators/A&P                         Lateralizing symptoms, new visual disturbances and balance issues raise concern for stroke or other CVA. CXR ordered to evaluate shortness of breath. CT does not show acute abnormality. CXR without effusion or infiltrate. Patient has stable SpO2 on 3L nasal cannula. Patient has shown no signs of respiratory distress while in the department and cardiopulmonary exam is unremarkable.   Symptoms still concerning for neurologic abnormality and unilateral weakness and frequent falls are not adequately explained by current data. Patient should undergo further evaluation for neurologic abnormality. Neurology consulted and admit order initiated. Final Clinical Impression(s) / ED Diagnoses Final  diagnoses:  Primary hypertension  Hypokalemia  Unilateral weakness  Frequent falls    Rx / DC Orders ED Discharge Orders     None        Dorien Chihuahua 10/10/20 Raynald Blend, MD 10/11/20 1225

## 2020-10-11 ENCOUNTER — Inpatient Hospital Stay (HOSPITAL_COMMUNITY): Payer: Medicare Other

## 2020-10-11 ENCOUNTER — Other Ambulatory Visit: Payer: Self-pay

## 2020-10-11 DIAGNOSIS — W19XXXA Unspecified fall, initial encounter: Secondary | ICD-10-CM

## 2020-10-11 DIAGNOSIS — R0602 Shortness of breath: Secondary | ICD-10-CM

## 2020-10-11 DIAGNOSIS — E876 Hypokalemia: Secondary | ICD-10-CM

## 2020-10-11 LAB — LIPID PANEL
Cholesterol: 177 mg/dL (ref 0–200)
HDL: 33 mg/dL — ABNORMAL LOW (ref >40)
LDL Cholesterol: 117 mg/dL — ABNORMAL HIGH (ref 0–99)
Total CHOL/HDL Ratio: 5.4 RATIO
Triglycerides: 134 mg/dL (ref <150)
VLDL: 27 mg/dL (ref 0–40)

## 2020-10-11 LAB — BASIC METABOLIC PANEL
Anion gap: 7 (ref 5–15)
BUN: 8 mg/dL (ref 8–23)
CO2: 28 mmol/L (ref 22–32)
Calcium: 7.9 mg/dL — ABNORMAL LOW (ref 8.9–10.3)
Chloride: 103 mmol/L (ref 98–111)
Creatinine, Ser: 0.54 mg/dL — ABNORMAL LOW (ref 0.61–1.24)
GFR, Estimated: >60 mL/min (ref >60)
Glucose, Bld: 100 mg/dL — ABNORMAL HIGH (ref 70–99)
Potassium: 2.6 mmol/L — CL (ref 3.5–5.1)
Sodium: 138 mmol/L (ref 135–145)

## 2020-10-11 LAB — ECHOCARDIOGRAM COMPLETE
Area-P 1/2: 3.42 cm2
Height: 66 in
P 1/2 time: 684 msec
S' Lateral: 3.02 cm
Weight: 2432 oz

## 2020-10-11 LAB — HEMOGLOBIN A1C
Hgb A1c MFr Bld: 5.8 % — ABNORMAL HIGH (ref 4.8–5.6)
Mean Plasma Glucose: 119.76 mg/dL

## 2020-10-11 LAB — TSH: TSH: 3.479 u[IU]/mL (ref 0.350–4.500)

## 2020-10-11 LAB — MAGNESIUM: Magnesium: 2.8 mg/dL — ABNORMAL HIGH (ref 1.7–2.4)

## 2020-10-11 IMAGING — MR MR HEAD W/O CM
6 of 10 series · 29 of 48 positions shown · non-contrast
Comparison: CT from [DATE].

CLINICAL DATA: Initial evaluation for neuro deficit, stroke
suspected.

EXAM:
MRI HEAD WITHOUT CONTRAST
TECHNIQUE: Multiplanar, multiecho pulse sequences of the brain and surrounding
structures were obtained without intravenous contrast.

[Series 2: DWI · axial · 3.0mm · 0.94mm/px · z∈[-69,+89]mm · 9 of 107 slices shown (1 of 2)]
[im 1/107]
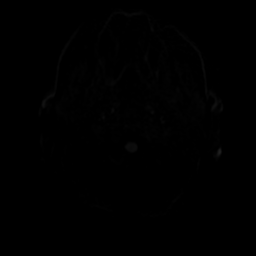
[im 14/107]
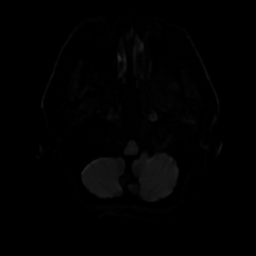
[im 27/107]
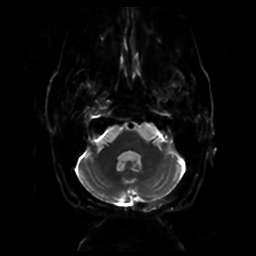
[im 40/107]
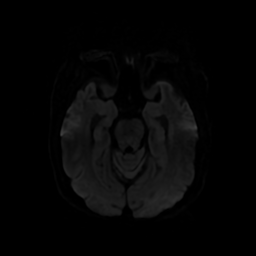
[im 54/107]
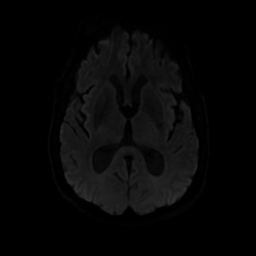
[im 67/107]
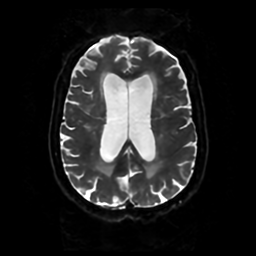
[im 80/107]
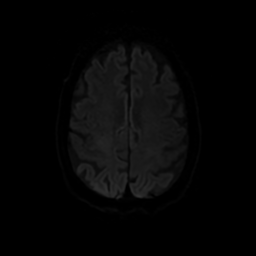
[im 93/107]
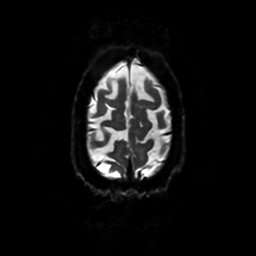
[im 107/107]
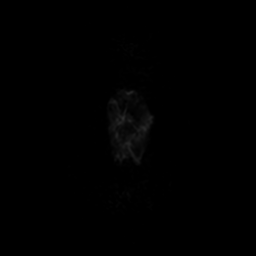

[Series 3: DWI · coronal · 4.0mm · 0.94mm/px · 7 of 77 slices shown (2 of 2)]
[im 1/77]
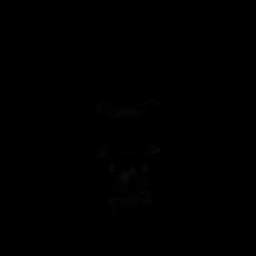
[im 13/77]
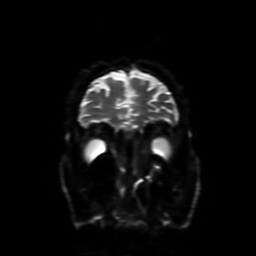
[im 26/77]
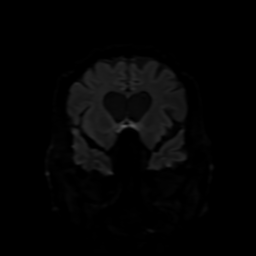
[im 39/77]
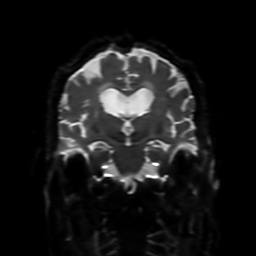
[im 51/77]
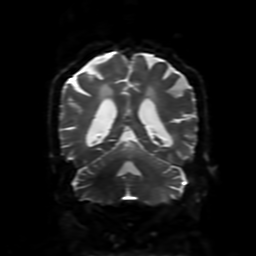
[im 64/77]
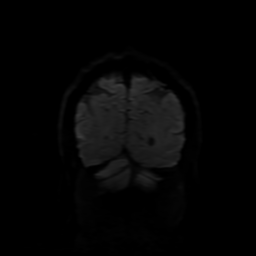
[im 77/77]
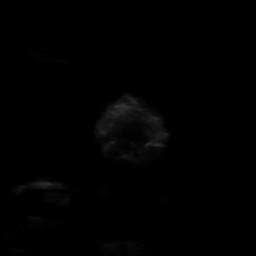

[Series 4: FLAIR · sagittal · 5.0mm · 0.23mm/px · 2 of 25 slices shown (1 of 2)]
[im 1/25]
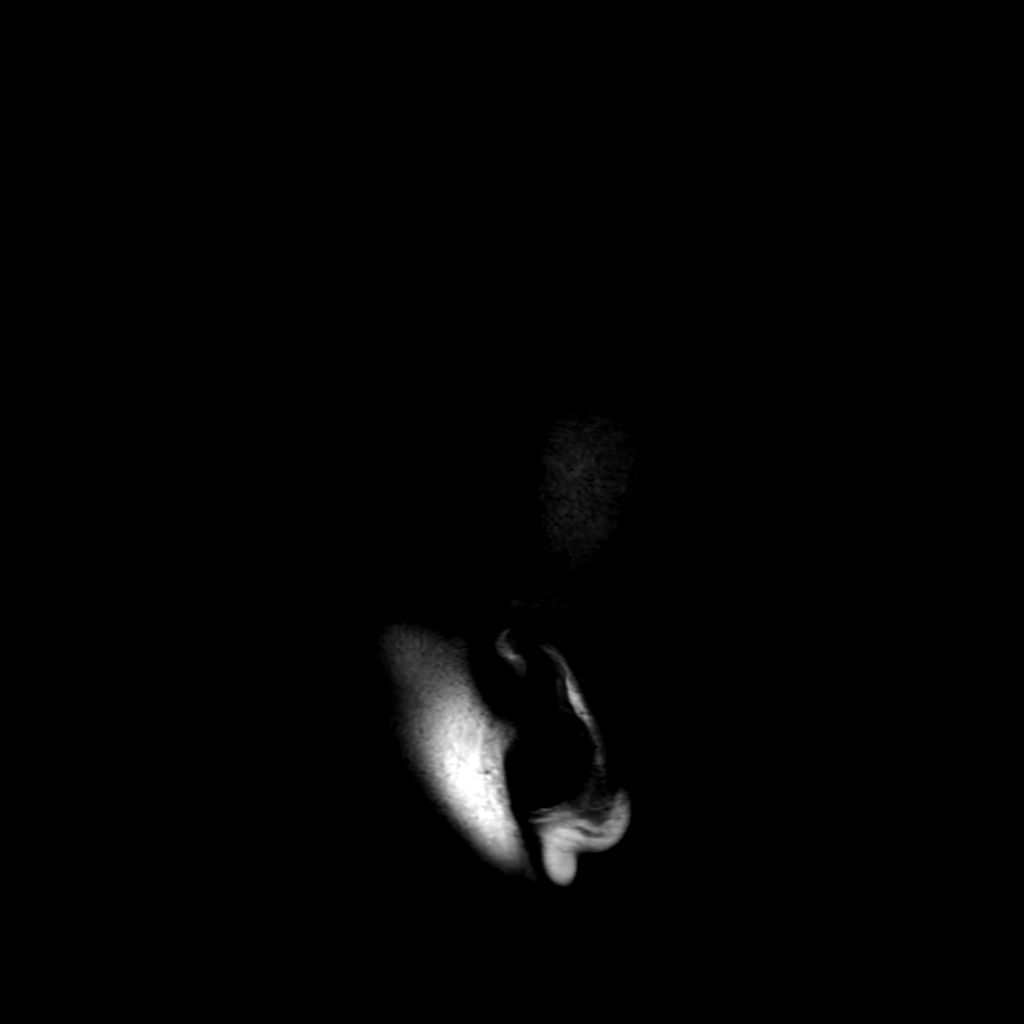
[im 25/25]
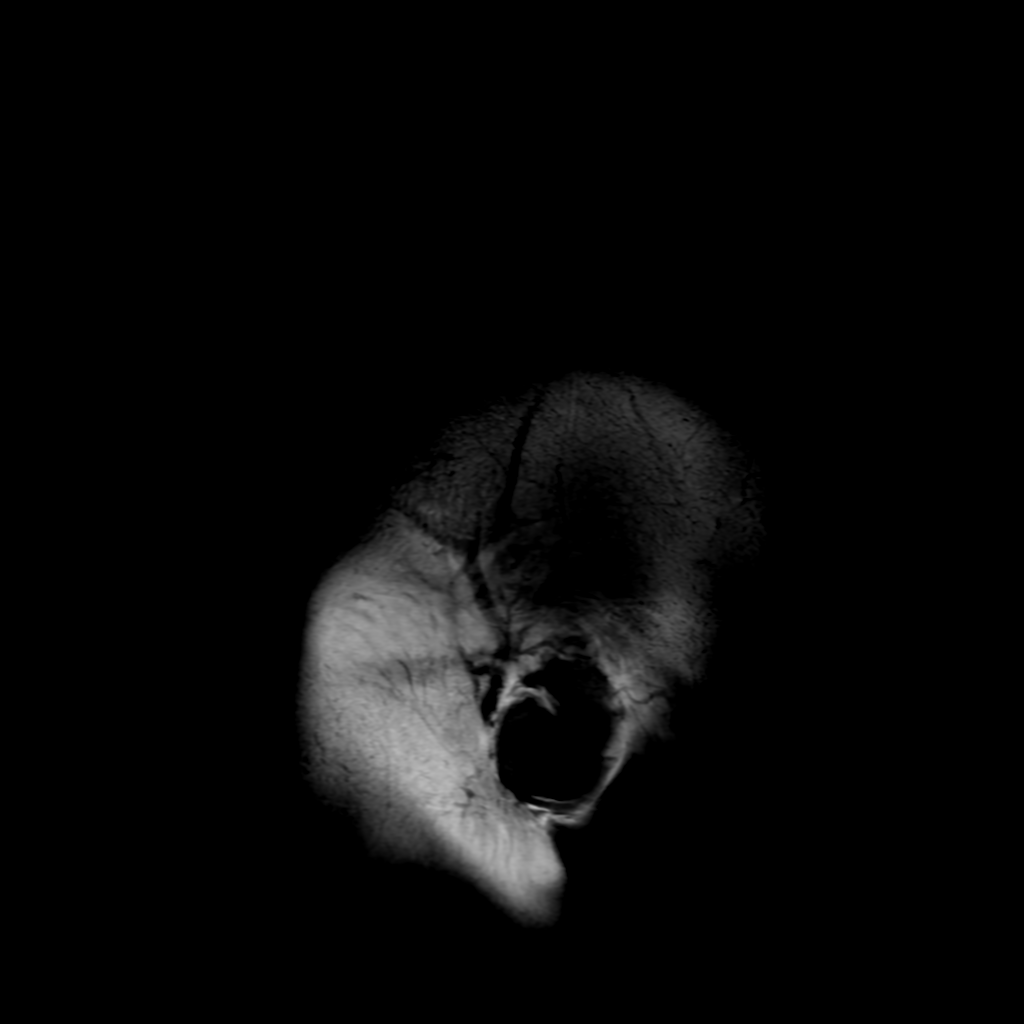

[Series 6: FLAIR · axial · 4.0mm · 0.45mm/px · z∈[-69,+89]mm · 3 of 37 slices shown (2 of 2)]
[im 1/37]
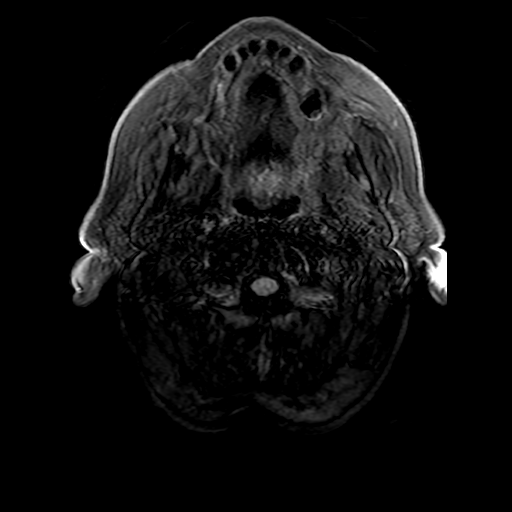
[im 19/37]
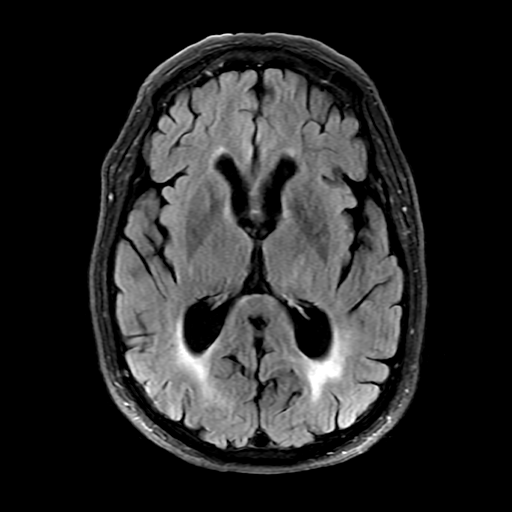
[im 37/37]
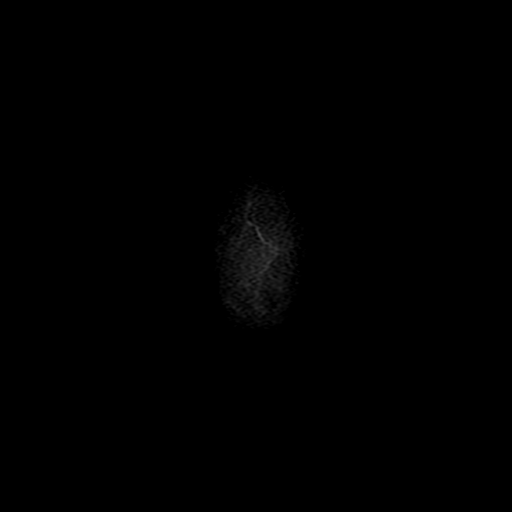

[Series 250: ADC · axial · 3.0mm · 0.94mm/px · z∈[-69,+89]mm · 5 of 54 slices shown (1 of 2)]
[im 1/54]
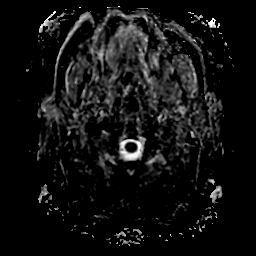
[im 14/54]
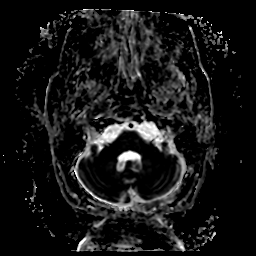
[im 27/54]
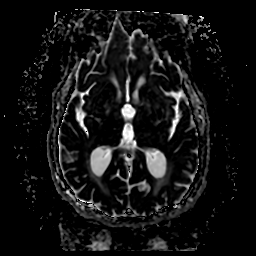
[im 40/54]
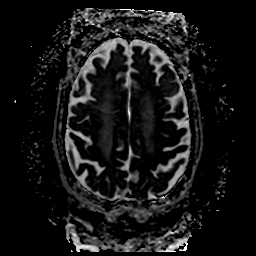
[im 54/54]
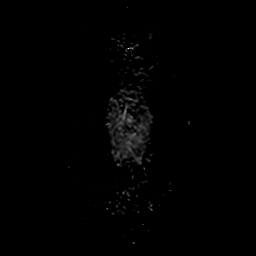

[Series 350: ADC · coronal · 4.0mm · 0.94mm/px · 3 of 39 slices shown (2 of 2)]
[im 1/39]
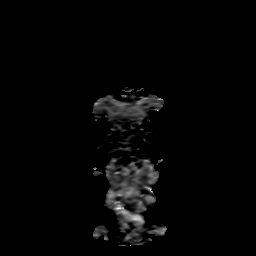
[im 20/39]
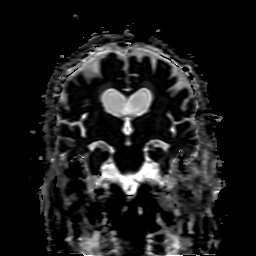
[im 39/39]
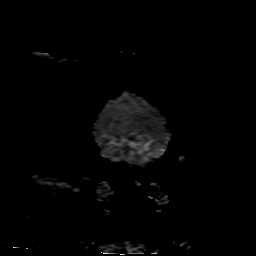

[29 of 48 positions shown; findings below may reference images not displayed]

FINDINGS: Brain: Cerebral volume within normal limits for age. Patchy T2/FLAIR
hyperintensity within the periventricular deep white matter both
cerebral hemispheres most consistent with chronic small vessel
ischemic disease, mild to moderate in nature. Apparent focus of
FLAIR signal abnormality at the left cerebellopontine angle is not
seen on corresponding sequences, favored to be artifactual in nature
(series 6, image 10).

No abnormal foci of restricted diffusion to suggest acute or
subacute ischemia. Gray-white matter differentiation maintained. No
encephalomalacia to suggest chronic cortical infarction. No evidence
for acute or chronic intracranial hemorrhage.

No mass lesion, midline shift or mass effect. Mild ventricular
prominence related to global parenchymal volume loss of
hydrocephalus. No extra-axial fluid collection. Pituitary gland
suprasellar region normal. Midline structures intact and normal.

Vascular: Major intracranial vascular flow voids are well
maintained.

Skull and upper cervical spine: Craniocervical junction normal.
Visualized bone marrow signal intensity within normal limits. No
visible focal marrow replacing lesion. No scalp soft tissue
abnormality.

Sinuses/Orbits: Globes and orbital soft tissues within normal
limits. Scattered mucosal thickening noted throughout the paranasal
sinuses. No significant mastoid effusion. Inner ear structures
grossly normal.

Other: None.
IMPRESSION: 1. No acute intracranial abnormality.
2. Mild to moderate chronic microvascular ischemic disease.

## 2020-10-11 IMAGING — MR MR MRA NECK WO/W CM
4 of 7 series · 18 of 48 positions shown · IV contrast (gadavist)
Comparison: None available.

CLINICAL DATA: Initial evaluation for neuro deficit, stroke
suspected. History of prostate cancer.

EXAM:
MRA NECK WITHOUT AND WITH CONTRAST
TECHNIQUE: Multiplanar and multiecho pulse sequences of the neck were obtained
without and with intravenous contrast. Angiographic images of the
neck were obtained using MRA technique without and with intravenous
contrast.
CONTRAST:  6.8mL GADAVIST GADOBUTROL 1 MMOL/ML IV SOLN

[Series 1200: cor cemra ft · coronal · 1.2mm · 0.59mm/px · 7 of 105 slices shown]
[im 1/105]
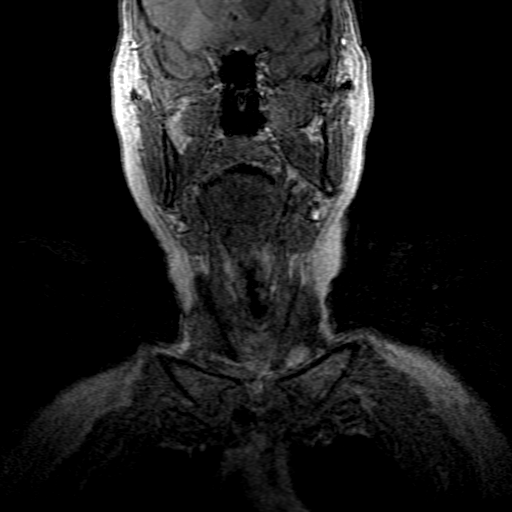
[im 18/105]
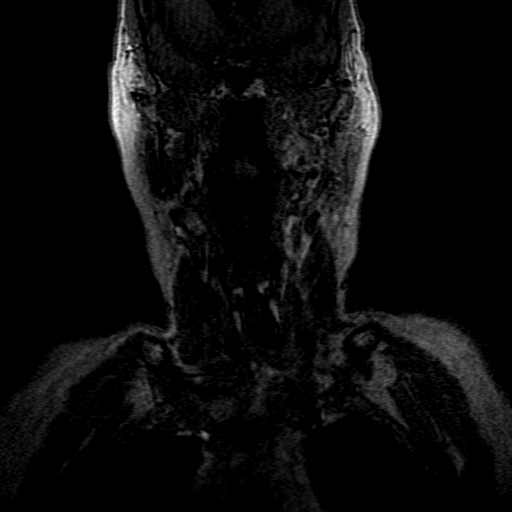
[im 35/105]
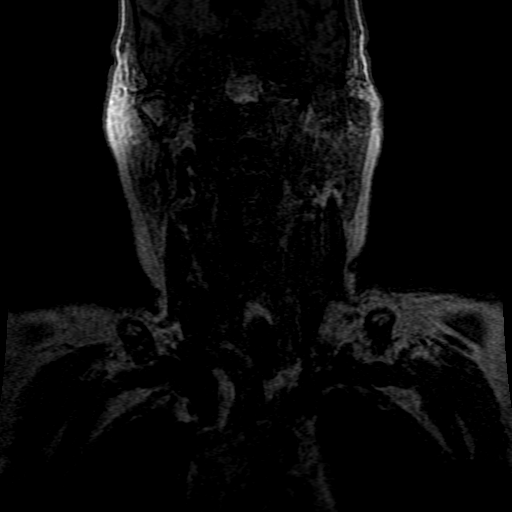
[im 53/105]
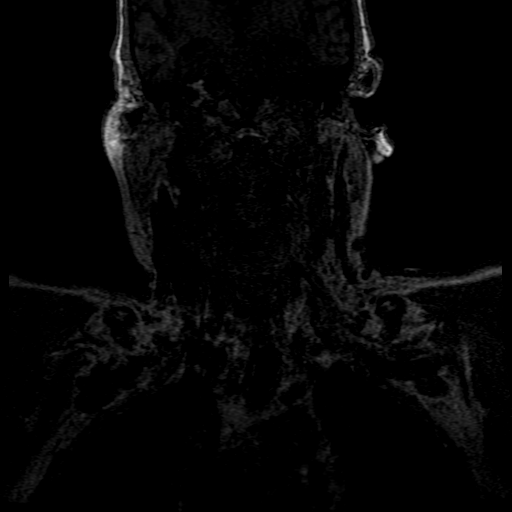
[im 70/105]
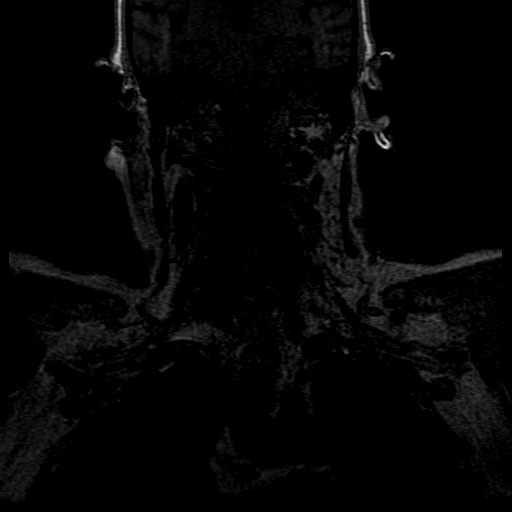
[im 87/105]
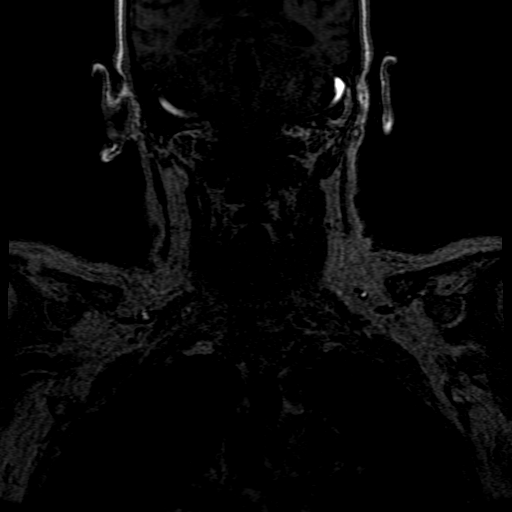
[im 105/105]
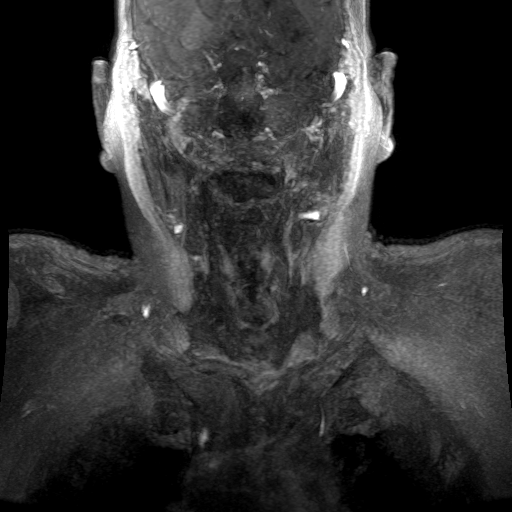

[Series 1201: ph1/cor cemra ft · coronal · 1.2mm · 0.59mm/px · 5 of 104 slices shown]
[im 1/104]
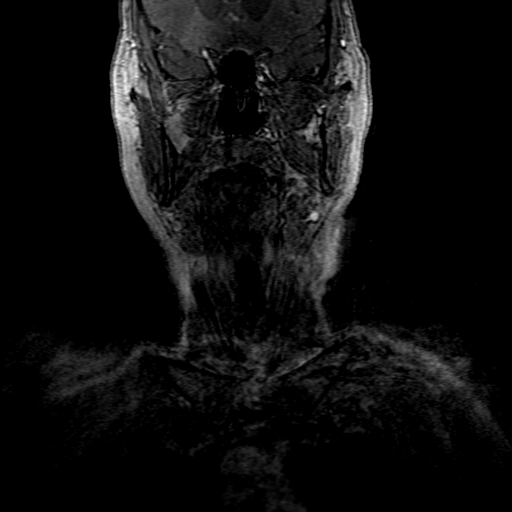
[im 18/104]
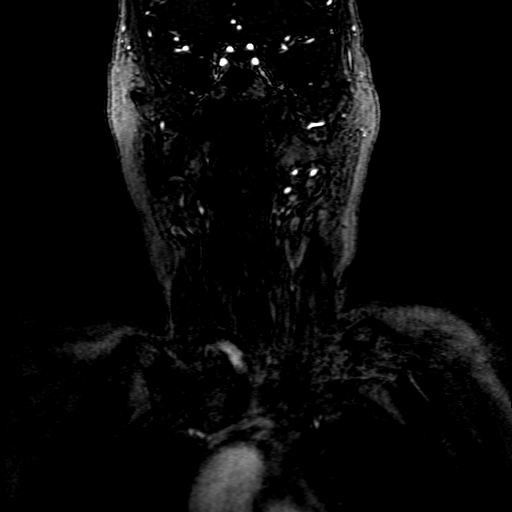
[im 35/104]
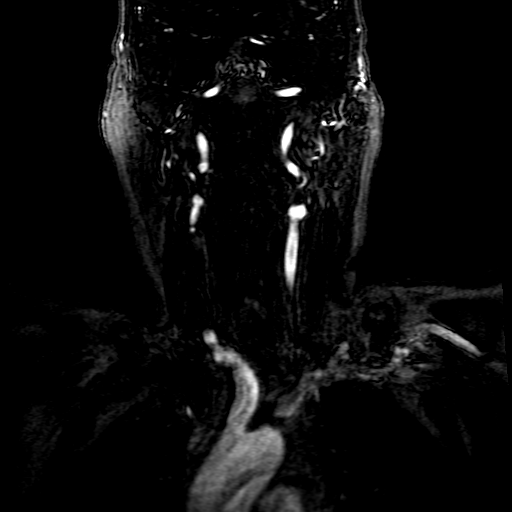
[im 52/104]
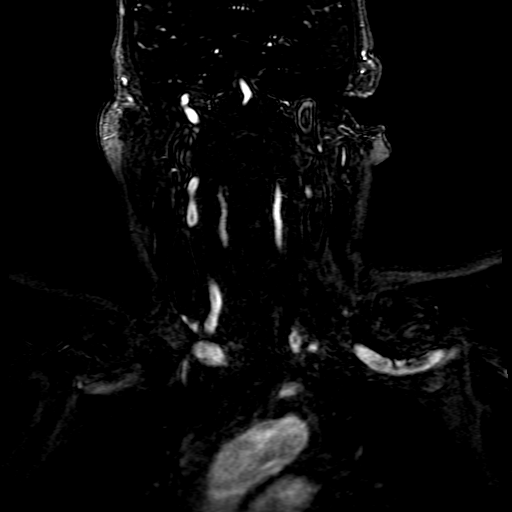
[im 86/104]
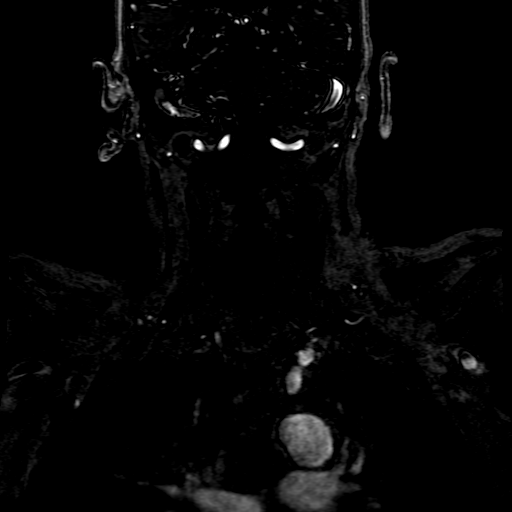

[Series 1202: ph2/cor cemra ft · coronal · 1.2mm · 0.59mm/px · 3 of 104 slices shown]
[im 18/104]
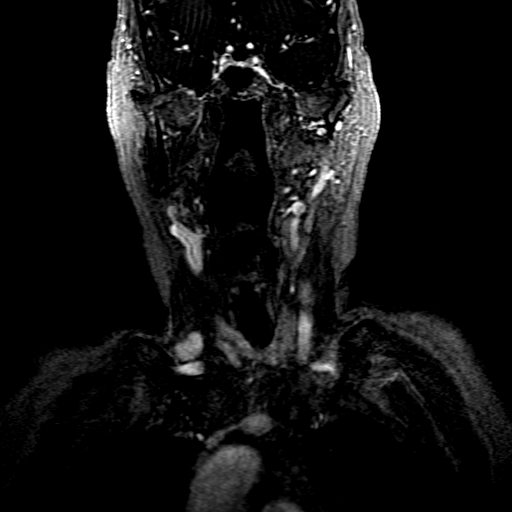
[im 52/104]
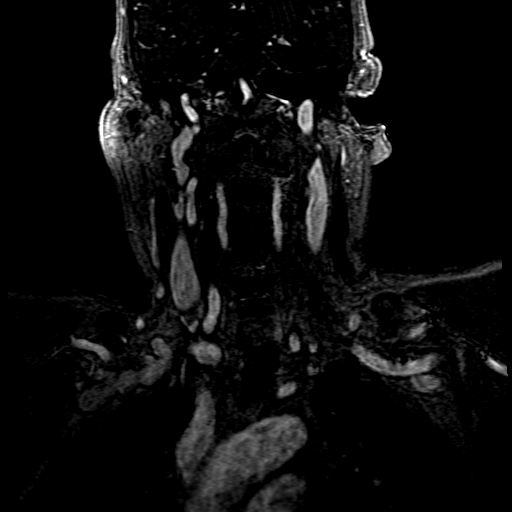
[im 86/104]
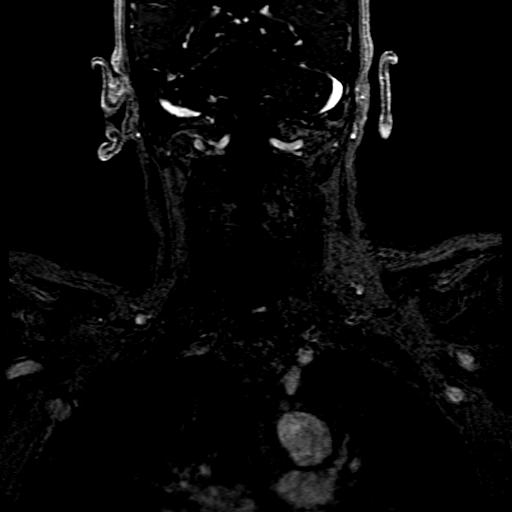

[((date))-((date)) · coronal · 1.2mm · 0.59mm/px · 3 of 105 slices shown]
[im 18/105]
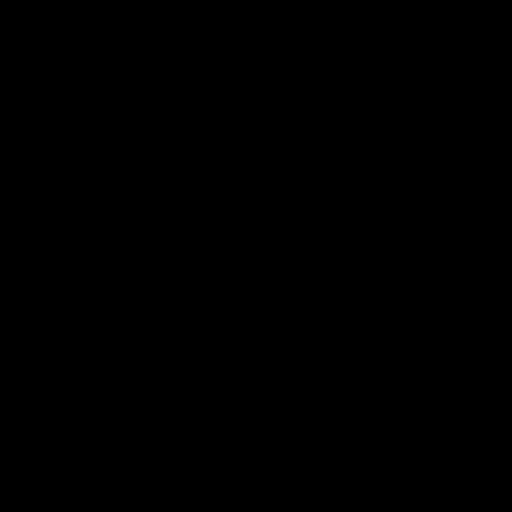
[im 53/105]
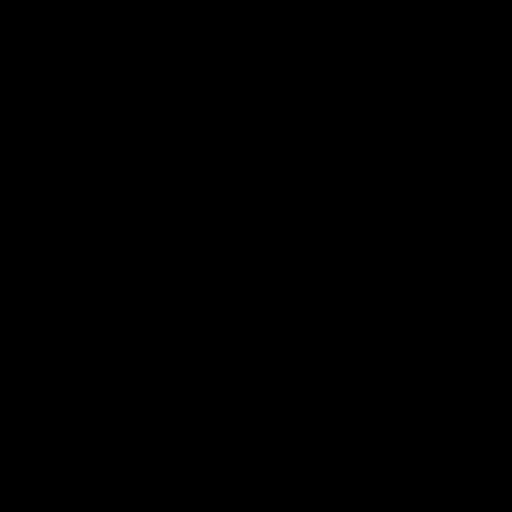
[im 87/105]
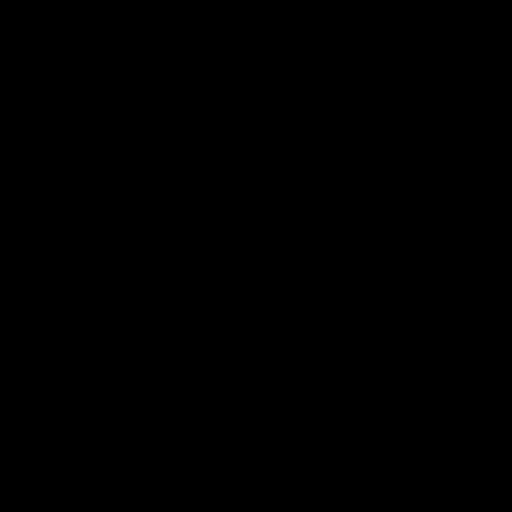

[18 of 48 positions shown; findings below may reference images not displayed]

FINDINGS: AORTIC ARCH: Visualized aortic arch normal caliber with normal
branch pattern. No hemodynamically significant stenosis seen about
the origin of the great vessels.

RIGHT CAROTID SYSTEM: Right CCA patent from its origin to the
bifurcation without stenosis. Atheromatous irregularity about the
right carotid bulb with associated mild 35% stenosis at the origin
of the cervical right ICA (series [PT], image 19). Right ICA
patent distally without stenosis, evidence for dissection or
occlusion.

LEFT CAROTID SYSTEM: Left CCA patent from its origin to the
bifurcation without stenosis. No significant atheromatous narrowing
about the left carotid bulb. Left ICA patent distally without
stenosis, dissection or occlusion.

VERTEBRAL ARTERIES: Both vertebral arteries arise from the
subclavian arteries. Innominate and proximal right subclavian
arteries are widely patent. Multifocal atheromatous irregularity
throughout the proximal left subclavian artery without high-grade
stenosis. Atheromatous irregularity about the origins of both
vertebral arteries, with associated moderate approximate 50%
stenosis on the left and no more than mild narrowing on the right.
Otherwise, vertebral arteries patent within the neck without
stenosis, evidence for dissection or occlusion. Atheromatous
irregularity noted about the right V3 segment without stenosis.

Other: None.
IMPRESSION: 1. Atheromatous irregularity about the right carotid bulb with
associated mild 35% stenosis at the origin of the cervical right
ICA.
2. Otherwise wide patency of both carotid artery systems within the
neck.
3. Atheromatous change about the origins of both vertebral arteries,
with associated moderate approximate 50% stenosis on the left and no
more than mild narrowing on the right.

## 2020-10-11 MED ORDER — GADOBUTROL 1 MMOL/ML IV SOLN
6.8000 mL | Freq: Once | INTRAVENOUS | Status: AC | PRN
  Administered 2020-10-11: 6.8 mL via INTRAVENOUS

## 2020-10-11 MED ORDER — POTASSIUM CHLORIDE 20 MEQ PO PACK
40.0000 meq | PACK | Freq: Once | ORAL | Status: AC
  Administered 2020-10-11: 40 meq via ORAL
  Filled 2020-10-11: qty 2

## 2020-10-11 MED ORDER — LABETALOL HCL 5 MG/ML IV SOLN
10.0000 mg | INTRAVENOUS | Status: DC | PRN
  Administered 2020-10-11: 10 mg via INTRAVENOUS
  Filled 2020-10-11: qty 4

## 2020-10-11 MED ORDER — ASPIRIN EC 325 MG PO TBEC
325.0000 mg | DELAYED_RELEASE_TABLET | Freq: Every day | ORAL | Status: DC
  Administered 2020-10-12: 325 mg via ORAL
  Filled 2020-10-11: qty 1

## 2020-10-11 MED ORDER — POTASSIUM CHLORIDE 10 MEQ/100ML IV SOLN
10.0000 meq | INTRAVENOUS | Status: AC
Start: 2020-10-11 — End: 2020-10-11
  Administered 2020-10-11 (×4): 10 meq via INTRAVENOUS
  Filled 2020-10-11 (×4): qty 100

## 2020-10-11 NOTE — Plan of Care (Signed)

## 2020-10-11 NOTE — Progress Notes (Addendum)
PROGRESS NOTE    Keith Olson.  SNK:[id number] DOB: [birth year] DOA: 10/10/2020 PCP: Dettinger, Fransisca Kaufmann, MD   Brief Narrative:   Keith Rucker Pridgeon. is a 80 y.o. 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  Patient seen for shortness of breath that has been going on for the past 3 days with disequilibrium that started approximately 2 to 3 weeks ago.  CT of his head was noted to be negative.  He needs to transfer to [facility] for further evaluation with brain MRI and possible neurology consultation thereafter.  He is noted to have ongoing hypokalemia with repletion ordered.  Assessment & Plan:   Principal Problem:   Fall Active Problems:   Prostate cancer metastatic to bone [facility])   Anxiety state   Hypothyroidism   Hypokalemia   Dysmetria   Follows with disequilibrium concerning for cerebellar CVA -Permissive hypertension with labetalol for BP 200/100 or greater -Continue aspirin at full dose -2D echocardiogram with LVEF 55-60% and grade 1 diastolic dysfunction -Carotid ultrasound pending -LDL 117, noted to have statin intolerance -Hemoglobin A1c pending -Brain MRI/MRA upon transfer -Neurology consultation as needed based on results  Severe hypokalemia -Replete oral and IV and monitor in a.m.  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   Difficult to place patient No  Consultants:  None  Procedures:  See below  Antimicrobials:  None   Subjective: Patient seen and evaluated today with ongoing dizziness noted.  Objective: Vitals:   10/11/20 0500 10/11/20 0555 10/11/20 0800 10/11/20 0817  BP: (!) 193/94 (!) 182/81 (!) 200/83   Pulse: 61 (!) 59 (!) 58   Resp: 19 15 15    Temp:    98.3 F (36.8 C)  TempSrc:      SpO2: 98% 97% 95%   Weight:      Height:        Intake/Output Summary (Last 24 hours) at 10/11/2020 1027 Last data filed at 10/11/2020 0918 Gross per 24 hour  Intake 100 ml  Output 800 ml  Net -700 ml   Filed Weights   10/10/20 1353  Weight: 68.9 kg    Examination:  General exam: Appears calm and comfortable  Respiratory system: Clear to auscultation. Respiratory effort normal. Cardiovascular system: S1 & S2 heard, RRR.  Gastrointestinal system: Abdomen is soft Central nervous system: Alert and awake Extremities: No edema Skin: No significant lesions noted Psychiatry: Flat affect.    Data Reviewed: I have personally reviewed following labs and imaging studies  CBC: Recent Labs  Lab 10/10/20 1558  WBC 6.6  NEUTROABS 3.9  HGB 14.5  HCT 42.7  MCV 92.0  PLT 937   Basic Metabolic Panel: Recent Labs  Lab 10/10/20  1558 10/11/20 0436  NA 137 138  K 2.6* 2.6*  CL 99 103  CO2 28 28  GLUCOSE 109* 100*  BUN 8 8  CREATININE 0.54* 0.54*  CALCIUM 8.7* 7.9*  MG  --  2.8*   GFR: Estimated Creatinine Clearance: 68.7 mL/min (A) (by C-G formula based on SCr of 0.54 mg/dL (L)). Liver Function Tests: Recent Labs  Lab 10/10/20 1558  AST 22  ALT 24  ALKPHOS 56  BILITOT 0.4  PROT 7.1  ALBUMIN 4.3   No results for input(s): LIPASE, AMYLASE in the last 168 hours. Recent Labs  Lab 10/10/20 1558  AMMONIA 12   Coagulation Profile: Recent Labs  Lab  10/10/20 1558  INR 1.0   Cardiac Enzymes: No results for input(s): CKTOTAL, CKMB, CKMBINDEX, TROPONINI in the last 168 hours. BNP (last 3 results) No results for input(s): PROBNP in the last 8760 hours. HbA1C: No results for input(s): HGBA1C in the last 72 hours. CBG: No results for input(s): GLUCAP in the last 168 hours. Lipid Profile: Recent Labs    10/11/20 0436  CHOL 177  HDL 33*  LDLCALC 117*  TRIG 134  CHOLHDL 5.4   Thyroid Function Tests: Recent Labs    10/11/20 0436  TSH 3.479   Anemia Panel: No results for input(s): VITAMINB12, FOLATE, FERRITIN, TIBC, IRON, RETICCTPCT in the last 72 hours. Sepsis Labs: No results for input(s): PROCALCITON, LATICACIDVEN in the last 168 hours.  Recent Results (from the past 240 hour(s))  Resp Panel by RT-PCR (Flu A&B, Covid) Nasopharyngeal Swab     Status: None   Collection Time: 10/10/20  4:00 PM   Specimen: Nasopharyngeal Swab; Nasopharyngeal(NP) swabs in vial transport medium  Result Value Ref Range Status   SARS Coronavirus 2 by RT PCR NEGATIVE NEGATIVE Final    Comment: (NOTE) SARS-CoV-2 target nucleic acids are NOT DETECTED.  The SARS-CoV-2 RNA is generally detectable in upper respiratory specimens during the acute phase of infection. The lowest concentration of SARS-CoV-2 viral copies this assay can detect is 138 copies/mL. A negative result does not preclude SARS-Cov-2 infection and should not be used as the sole basis for treatment or other patient management decisions. A negative result may occur with  improper specimen collection/handling, submission of specimen other than nasopharyngeal swab, presence of viral mutation(s) within the areas targeted by this assay, and inadequate number of viral copies(<138 copies/mL). A negative result must be combined with clinical observations, patient history, and epidemiological information. The expected result is Negative.  Fact Sheet for Patients:   EntrepreneurPulse.com.au  Fact Sheet for Healthcare Providers:  IncredibleEmployment.be  This test is no t yet approved or cleared by the [place] FDA and  has been authorized for detection and/or diagnosis of SARS-CoV-2 by FDA under an Emergency Use Authorization (EUA). This EUA will remain  in effect (meaning this test can be used) for the duration of the COVID-19 declaration under Section 564(b)(1) of the Act, 21 U.S.C.section 360bbb-3(b)(1), unless the authorization is terminated  or revoked sooner.       Influenza A by PCR NEGATIVE NEGATIVE Final   Influenza B by PCR NEGATIVE NEGATIVE Final    Comment: (NOTE) The Xpert Xpress SARS-CoV-2/FLU/RSV plus assay is intended as an aid in the diagnosis of influenza from Nasopharyngeal swab specimens and should not be used as a sole basis for treatment. Nasal washings and aspirates are unacceptable for Xpert Xpress SARS-CoV-2/FLU/RSV testing.  Fact Sheet for Patients: EntrepreneurPulse.com.au  Fact Sheet for Healthcare Providers: IncredibleEmployment.be  This  test is not yet approved or cleared by the [place] and has been authorized for detection and/or diagnosis of SARS-CoV-2 by FDA under an Emergency Use Authorization (EUA). This EUA will remain in effect (meaning this test can be used) for the duration of the COVID-19 declaration under Section 564(b)(1) of the Act, 21 U.S.C. section 360bbb-3(b)(1), unless the authorization is terminated or revoked.  Performed at [facility], [address], [place], [place] [address]          Radiology Studies: CT HEAD WO CONTRAST  Result Date: 10/10/2020 CLINICAL DATA:  Dizziness, history of cancer EXAM: CT HEAD WITHOUT CONTRAST TECHNIQUE: Contiguous axial images were obtained from the base of the skull through the vertex without intravenous contrast. COMPARISON:  04/04/2020 FINDINGS: Brain: No  evidence of acute infarction, hemorrhage, hydrocephalus, extra-axial collection or mass lesion/mass effect. Periventricular white matter changes, likely the sequela of chronic small vessel ischemic disease. Vascular: No hyperdense vessel or unexpected calcification. Skull: Normal. Negative for fracture or focal lesion. Sinuses/Orbits: Mucosal thickening in the left maxillary sinus and bilateral ethmoid air cells. The orbits are unremarkable. Other: None. IMPRESSION: No acute intracranial process. Electronically Signed   By: Merilyn Baba M.D.   On: 10/10/2020 16:41   DG Chest Portable 1 View  Result Date: 10/10/2020 CLINICAL DATA:  Shortness of breath EXAM: PORTABLE CHEST 1 VIEW COMPARISON:  04/04/2020 FINDINGS: Cardiac shadow is mildly prominent but stable. Aortic calcifications are seen. The lungs are well aerated bilaterally. No focal infiltrate or effusion is noted. No bony abnormality is noted. IMPRESSION: No active disease. Electronically Signed   By: Inez Catalina M.D.   On: 10/10/2020 17:21   ECHOCARDIOGRAM COMPLETE  Result Date: 10/11/2020    ECHOCARDIOGRAM REPORT   Patient Name:   Keith Olson. Date of Exam: 10/11/2020 Medical Rec #:  [id number]        Height:       66.0 in Accession #:    [id number]       Weight:       152.0 lb Date of Birth:  27-May-1940        BSA:          1.780 m Patient Age:    62 years         BP:           200/83 mmHg Patient Gender: M                HR:           58 bpm. Exam Location:  [facility] Procedure: 2D Echo, Cardiac Doppler and Color Doppler Indications:    CVA  History:        Patient has prior history of Echocardiogram examinations, most                 recent 04/08/2020. CAD, Signs/Symptoms:Shortness of Breath and                 Dizziness, fall, bone cancer; Risk Factors:Hypertension.  Sonographer:    Dustin Flock RDCS Referring Phys: [place]  1. Left ventricular ejection fraction, by estimation, is 55 to 60%. The left ventricle has  normal function. The left ventricle has no regional wall motion abnormalities. There is mild left ventricular hypertrophy. Left ventricular diastolic parameters are consistent with Grade I diastolic dysfunction (impaired relaxation).  2. Right ventricular systolic function is normal. The right ventricular size is normal. Tricuspid regurgitation signal is inadequate for assessing PA  pressure.  3. The mitral valve is normal in structure. Trivial mitral valve regurgitation. No evidence of mitral stenosis.  4. The aortic valve is tricuspid. Aortic valve regurgitation is mild. No aortic stenosis is present.  5. The inferior vena cava is normal in size with greater than 50% respiratory variability, suggesting right atrial pressure of 3 mmHg. FINDINGS  Left Ventricle: Left ventricular ejection fraction, by estimation, is 55 to 60%. The left ventricle has normal function. The left ventricle has no regional wall motion abnormalities. The left ventricular internal cavity size was normal in size. There is  mild left ventricular hypertrophy. Left ventricular diastolic parameters are consistent with Grade I diastolic dysfunction (impaired relaxation). Right Ventricle: The right ventricular size is normal. Right ventricular systolic function is normal. Tricuspid regurgitation signal is inadequate for assessing PA pressure. The tricuspid regurgitant velocity is 2.50 m/s, and with an assumed right atrial  pressure of 3 mmHg, the estimated right ventricular systolic pressure is 94.8 mmHg. Left Atrium: Left atrial size was normal in size. Right Atrium: Right atrial size was normal in size. Pericardium: There is no evidence of pericardial effusion. Mitral Valve: The mitral valve is normal in structure. Trivial mitral valve regurgitation. No evidence of mitral valve stenosis. Tricuspid Valve: The tricuspid valve is normal in structure. Tricuspid valve regurgitation is trivial. No evidence of tricuspid stenosis. Aortic Valve: The aortic  valve is tricuspid. Aortic valve regurgitation is mild. Aortic regurgitation PHT measures 684 msec. No aortic stenosis is present. Pulmonic Valve: The pulmonic valve was normal in structure. Pulmonic valve regurgitation is trivial. No evidence of pulmonic stenosis. Aorta: The aortic root is normal in size and structure. Venous: The inferior vena cava is normal in size with greater than 50% respiratory variability, suggesting right atrial pressure of 3 mmHg. IAS/Shunts: No atrial level shunt detected by color flow Doppler.  LEFT VENTRICLE PLAX 2D LVIDd:         4.89 cm  Diastology LVIDs:         3.02 cm  LV e' medial:    6.13 cm/s LV PW:         1.22 cm  LV E/e' medial:  11.1 LV IVS:        1.36 cm  LV e' lateral:   5.96 cm/s LVOT diam:     2.30 cm  LV E/e' lateral: 11.4 LV SV:         92 LV SV Index:   52 LVOT Area:     4.15 cm  RIGHT VENTRICLE RV Basal diam:  2.63 cm RV S prime:     17.90 cm/s TAPSE (M-mode): 1.6 cm LEFT ATRIUM             Index       RIGHT ATRIUM          Index LA diam:        3.20 cm 1.80 cm/m  RA Area:     9.93 cm LA Vol (A2C):   35.7 ml 20.06 ml/m RA Volume:   19.40 ml 10.90 ml/m LA Vol (A4C):   25.0 ml 14.05 ml/m LA Biplane Vol: 32.2 ml 18.09 ml/m  AORTIC VALVE LVOT Vmax:   90.30 cm/s LVOT Vmean:  63.200 cm/s LVOT VTI:    0.221 m AI PHT:      684 msec  AORTA Ao Root diam: 3.60 cm MITRAL VALVE               TRICUSPID VALVE MV Area (PHT): 3.42 cm  TR Peak grad:   25.0 mmHg MV Decel Time: 222 msec    TR Vmax:        250.00 cm/s MV E velocity: 68.10 cm/s MV A velocity: 90.30 cm/s  SHUNTS MV E/A ratio:  0.75        Systemic VTI:  0.22 m                            Systemic Diam: 2.30 cm Kirk Ruths MD Electronically signed by Kirk Ruths MD Signature Date/Time: 10/11/2020/10:06:12 AM    Final         Scheduled Meds:   stroke: mapping our early stages of recovery book   Does not apply Once   abiraterone acetate  1,000 mg Oral Daily   aspirin EC  81 mg Oral Daily   dronabinol   2.5 mg Oral BID AC   DULoxetine  60 mg Oral Daily   enoxaparin (LOVENOX) injection  40 mg Subcutaneous Q24H   [START ON 10/13/2020] fentaNYL  1 patch Transdermal Q72H   fluticasone  2 spray Each Nare Daily   gabapentin  400 mg Oral TID   linaclotide  145 mcg Oral QAC breakfast   LORazepam  0.5 mg Intravenous Once   potassium chloride  20 mEq Oral BID   Continuous Infusions:  potassium chloride 10 mEq (10/11/20 0920)     LOS: 1 day    Time spent: 35 minutes    Janay Canan Darleen Crocker, DO [facility]  If 7PM-7AM, please contact night-coverage [email or web address] 10/11/2020, 10:27 AM

## 2020-10-11 NOTE — ED Notes (Signed)
Called [facility] for transport to [facility]. 

## 2020-10-11 NOTE — Progress Notes (Signed)
  Echocardiogram 2D Echocardiogram has been performed.  Keith Olson 10/11/2020, 8:54 AM

## 2020-10-12 DIAGNOSIS — R278 Other lack of coordination: Principal | ICD-10-CM

## 2020-10-12 DIAGNOSIS — C61 Malignant neoplasm of prostate: Secondary | ICD-10-CM

## 2020-10-12 DIAGNOSIS — F411 Generalized anxiety disorder: Secondary | ICD-10-CM

## 2020-10-12 DIAGNOSIS — E039 Hypothyroidism, unspecified: Secondary | ICD-10-CM

## 2020-10-12 DIAGNOSIS — C7951 Secondary malignant neoplasm of bone: Secondary | ICD-10-CM

## 2020-10-12 LAB — BASIC METABOLIC PANEL
Anion gap: 8 (ref 5–15)
BUN: 5 mg/dL — ABNORMAL LOW (ref 8–23)
CO2: 22 mmol/L (ref 22–32)
Calcium: 7.7 mg/dL — ABNORMAL LOW (ref 8.9–10.3)
Chloride: 107 mmol/L (ref 98–111)
Creatinine, Ser: 0.49 mg/dL — ABNORMAL LOW (ref 0.61–1.24)
GFR, Estimated: >60 mL/min (ref >60)
Glucose, Bld: 96 mg/dL (ref 70–99)
Potassium: 2.7 mmol/L — CL (ref 3.5–5.1)
Sodium: 137 mmol/L (ref 135–145)

## 2020-10-12 LAB — VITAMIN D 25 HYDROXY (VIT D DEFICIENCY, FRACTURES): Vit D, 25-Hydroxy: 31.94 ng/mL (ref 30–100)

## 2020-10-12 LAB — MAGNESIUM: Magnesium: 2.3 mg/dL (ref 1.7–2.4)

## 2020-10-12 LAB — VITAMIN B12: Vitamin B-12: 299 pg/mL (ref 180–914)

## 2020-10-12 MED ORDER — FENTANYL 25 MCG/HR TD PT72
1.0000 | MEDICATED_PATCH | TRANSDERMAL | Status: DC
Start: 2020-10-12 — End: 2020-10-13
  Administered 2020-10-12: 1 via TRANSDERMAL
  Filled 2020-10-12: qty 1

## 2020-10-12 MED ORDER — PREDNISONE 5 MG PO TABS
5.0000 mg | ORAL_TABLET | Freq: Every day | ORAL | Status: DC
  Administered 2020-10-12 – 2020-10-13 (×2): 5 mg via ORAL
  Filled 2020-10-12 (×2): qty 1

## 2020-10-12 MED ORDER — POTASSIUM CHLORIDE 10 MEQ/100ML IV SOLN
10.0000 meq | INTRAVENOUS | Status: AC
Start: 2020-10-12 — End: 2020-10-12
  Administered 2020-10-12 (×4): 10 meq via INTRAVENOUS
  Filled 2020-10-12 (×4): qty 100

## 2020-10-12 MED ORDER — CHLORHEXIDINE GLUCONATE CLOTH 2 % EX PADS
6.0000 | MEDICATED_PAD | Freq: Every day | CUTANEOUS | Status: DC
  Administered 2020-10-12 – 2020-10-13 (×2): 6 via TOPICAL

## 2020-10-12 MED ORDER — POTASSIUM CHLORIDE CRYS ER 20 MEQ PO TBCR
40.0000 meq | EXTENDED_RELEASE_TABLET | ORAL | Status: AC
  Administered 2020-10-12: 40 meq via ORAL
  Filled 2020-10-12: qty 2

## 2020-10-12 MED ORDER — LISINOPRIL 10 MG PO TABS
10.0000 mg | ORAL_TABLET | Freq: Every day | ORAL | Status: DC
  Administered 2020-10-12 – 2020-10-13 (×2): 10 mg via ORAL
  Filled 2020-10-12 (×2): qty 1

## 2020-10-12 MED ORDER — ASPIRIN EC 81 MG PO TBEC
81.0000 mg | DELAYED_RELEASE_TABLET | Freq: Every day | ORAL | Status: DC
  Administered 2020-10-13: 81 mg via ORAL
  Filled 2020-10-12: qty 1

## 2020-10-12 NOTE — Progress Notes (Addendum)
PROGRESS NOTE    Keith Olson.  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  CT head scan was negative.  Patient was then admitted to [facility] for MRI and possible neurological consultation.  He was noted to have a significant hypokalemia as well.    Assessment & Plan:   Principal Problem:   Fall Active Problems:   Prostate cancer metastatic to bone [facility])   Anxiety state   Hypothyroidism   Hypokalemia   Dysmetria  Falls with disequilibrium  CVA ruled out. On xanax and pain meds at home but needs them due his his cancer and anxiety. Continue low dose aspirin. -2D echocardiogram with LVEF 55-60% and grade 1 diastolic dysfunction. LDL 117, noted to have statin intolerance. -Hemoglobin A1c of 5.8. Might need rehab, will get PT/OT evaluation.  TSH of 3.4.  We will get vitamin B12 level, vitamin D levels.  MRI of the brain did not show any evidence of stroke.  Chronic pain syndrome Will resume dilaudid, fentanyl patch from home.  Patient has chronic pain from metastatic prostate cancer.  Feels like he is in mild withdrawal now due to lack of fentanyl patch.  Will resume.  Severe hypokalemia Will replenish aggressively. Magnesium within normal limits. Will check levels in am.   Hypothyroidism -TSH noted to be 3.479. -Not currently on levothyroxine  Hypertension Resume lisinopril monitor closely.    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  DVT prophylaxis: Lovenox subq  Code Status: DNR  Family Communication: none  Disposition Plan:  Status is: Inpatient  Remains inpatient appropriate  because:Ongoing diagnostic testing needed not appropriate for outpatient work up, IV treatments appropriate due to intensity of illness or inability to take PO, and Inpatient level of care appropriate due to severity of illness  Dispo: The patient is from: Home              Anticipated d/c is to: Undetermined at this time.  Will need PT evaluation.              Patient currently is not medically stable to d/c.   Difficult to place patient No  Consultants:  None  Procedures:  None  Antimicrobials:  None   Subjective: Patient was seen and examined at bedside.  Patient complains of generalized body pain.  He states that he did not get his fentanyl patch.  Feels jittery and anxious.  Objective: Vitals:   10/11/20 1759 10/11/20 1918 10/12/20 0327 10/12/20 0842  BP: (!) 176/88  (!) 197/85 (!) 167/76  Pulse: (!) 55  (!) 58   Resp: 18  18   Temp: 97.6 F (36.4 C)  97.7 F (36.5 C)   TempSrc: Oral     SpO2: 100% 94% 94% 97%  Weight:      Height:        Intake/Output Summary (Last 24 hours) at 10/12/2020 0904 Last data filed at 10/11/2020 0918 Gross per 24 hour  Intake 100 ml  Output --  Net 100 ml    Filed Weights   10/10/20 1353  Weight: 68.9 kg    Physical examination: General:  Average built, not in obvious distress,  mildly anxious, HENT:   No scleral pallor or icterus noted. Oral mucosa is moist.  Chest:  Clear breath sounds.  Diminished breath sounds bilaterally. No crackles or wheezes.  CVS: S1 &S2 heard. No murmur.  Regular rate and rhythm. Abdomen: Soft, nontender, nondistended.  Bowel sounds are heard.   Extremities: No cyanosis, clubbing or edema.  Peripheral pulses are palpable. Psych: Alert, awake and oriented, flat affect CNS:  No cranial nerve deficits.  Power equal in all extremities.   Skin: Warm and dry.  No rashes noted.   Data Reviewed: I have personally reviewed the following labs and imaging studies.  CBC: Recent Labs  Lab 10/10/20 1558  WBC  6.6  NEUTROABS 3.9  HGB 14.5  HCT 42.7  MCV 92.0  PLT 836    Basic Metabolic Panel: Recent Labs  Lab 10/10/20 1558 10/11/20 0436 10/12/20 0212  NA 137 138 137  K 2.6* 2.6* 2.7*  CL 99 103 107  CO2 28 28 22   GLUCOSE 109* 100* 96  BUN 8 8 5*  CREATININE 0.54* 0.54* 0.49*  CALCIUM 8.7* 7.9* 7.7*  MG  --  2.8* 2.3    GFR: Estimated Creatinine Clearance: 68.7 mL/min (A) (by C-G formula based on SCr of 0.49 mg/dL (L)). Liver Function Tests: Recent Labs  Lab 10/10/20 1558  AST 22  ALT 24  ALKPHOS 56  BILITOT 0.4  PROT 7.1  ALBUMIN 4.3    No results for input(s): LIPASE, AMYLASE in the last 168 hours. Recent Labs  Lab 10/10/20 1558  AMMONIA 12    Coagulation Profile: Recent Labs  Lab 10/10/20 1558  INR 1.0    Cardiac Enzymes: No results for input(s): CKTOTAL, CKMB, CKMBINDEX, TROPONINI in the last 168 hours. BNP (last 3 results) No results for input(s): PROBNP in the last 8760 hours. HbA1C: Recent Labs    10/11/20 0436  HGBA1C 5.8*   CBG: No results for input(s): GLUCAP in the last 168 hours. Lipid Profile: Recent Labs    10/11/20 0436  CHOL 177  HDL 33*  LDLCALC 117*  TRIG 134  CHOLHDL 5.4    Thyroid Function Tests: Recent Labs    10/11/20 0436  TSH 3.479    Anemia Panel: No results for input(s): VITAMINB12, FOLATE, FERRITIN, TIBC, IRON, RETICCTPCT in the last 72 hours. Sepsis Labs: No results for input(s): PROCALCITON, LATICACIDVEN in the last 168 hours.  Recent Results (from the past 240 hour(s))  Resp Panel by RT-PCR (Flu A&B, Covid) Nasopharyngeal Swab     Status: None   Collection Time: 10/10/20  4:00 PM   Specimen: Nasopharyngeal Swab; Nasopharyngeal(NP) swabs in vial transport medium  Result Value Ref Range Status   SARS Coronavirus 2 by RT PCR NEGATIVE NEGATIVE Final    Comment: (NOTE) SARS-CoV-2 target nucleic acids are NOT DETECTED.  The SARS-CoV-2 RNA is generally detectable in upper respiratory specimens during the  acute phase of infection. The lowest concentration of SARS-CoV-2 viral copies this assay can detect is 138 copies/mL. A negative result does not preclude SARS-Cov-2 infection and should not be used as the sole basis for treatment or other patient management decisions. A negative result may occur with  improper specimen collection/handling, submission of specimen other than nasopharyngeal swab, presence of viral mutation(s) within the areas targeted by this assay, and inadequate number of viral copies(<138 copies/mL). A negative result must be combined with clinical observations, patient history, and epidemiological information. The expected result is Negative.  Fact Sheet for Patients:  EntrepreneurPulse.com.au  Fact Sheet for Healthcare Providers:  IncredibleEmployment.be  This test is no t yet approved or cleared by the [place] FDA and  has been authorized for detection and/or diagnosis of SARS-CoV-2 by FDA under an Emergency Use Authorization (EUA). This EUA will remain  in effect (meaning this test can be used) for the duration of the COVID-19 declaration under Section 564(b)(1) of the Act, 21 U.S.C.section 360bbb-3(b)(1), unless the authorization is terminated  or revoked sooner.       Influenza A by PCR NEGATIVE NEGATIVE Final   Influenza B by PCR NEGATIVE NEGATIVE Final    Comment: (NOTE) The Xpert Xpress SARS-CoV-2/FLU/RSV plus assay is intended as an aid in the diagnosis of influenza from Nasopharyngeal swab specimens and should not be used as a sole basis for treatment. Nasal washings and aspirates are unacceptable for Xpert Xpress SARS-CoV-2/FLU/RSV testing.  Fact Sheet for Patients: EntrepreneurPulse.com.au  Fact Sheet for Healthcare Providers: IncredibleEmployment.be  This test is not yet approved or cleared by the [place] FDA and has been authorized for detection and/or  diagnosis of SARS-CoV-2 by FDA under an Emergency Use Authorization (EUA). This EUA will remain in effect (meaning this test can be used) for the duration of the COVID-19 declaration under Section 564(b)(1) of the Act, 21 U.S.C. section 360bbb-3(b)(1), unless the authorization is terminated or revoked.  Performed at [facility], [address], [place], [place] [address]           Radiology Studies: CT HEAD WO CONTRAST  Result Date: 10/10/2020 CLINICAL DATA:  Dizziness, history of cancer EXAM: CT HEAD WITHOUT CONTRAST TECHNIQUE: Contiguous axial images were obtained from the base of the skull through the vertex without intravenous contrast. COMPARISON:  04/04/2020 FINDINGS: Brain: No evidence of acute infarction, hemorrhage, hydrocephalus, extra-axial collection or mass lesion/mass effect. Periventricular white matter changes, likely the sequela of chronic small vessel ischemic disease. Vascular: No hyperdense vessel or unexpected calcification. Skull: Normal. Negative for fracture or focal lesion. Sinuses/Orbits: Mucosal thickening in the left maxillary sinus and bilateral ethmoid air cells. The orbits are unremarkable. Other: None. IMPRESSION: No acute intracranial process. Electronically Signed   By: Merilyn Baba M.D.   On: 10/10/2020 16:41   MR ANGIO NECK W WO CONTRAST  Result Date: 10/11/2020 CLINICAL DATA:  Initial evaluation for neuro deficit, stroke suspected. History of prostate cancer. EXAM: MRA NECK WITHOUT AND WITH CONTRAST TECHNIQUE: Multiplanar and multiecho pulse sequences of the neck were obtained without and with intravenous contrast. Angiographic images of the neck were obtained using MRA technique without and with intravenous contrast. CONTRAST:  6.108mL GADAVIST GADOBUTROL 1 MMOL/ML IV SOLN COMPARISON:  None available. FINDINGS: AORTIC ARCH: Visualized aortic arch normal caliber with normal branch pattern. No hemodynamically significant stenosis seen about the origin of the  great vessels. RIGHT CAROTID SYSTEM: Right CCA patent from its origin to the bifurcation without stenosis. Atheromatous irregularity about the right carotid bulb with associated mild 35% stenosis at the origin of the cervical right ICA (series [id number], image 19). Right ICA patent distally without stenosis, evidence for dissection or occlusion. LEFT CAROTID SYSTEM: Left CCA patent from its origin to the bifurcation without stenosis. No significant atheromatous narrowing about the left carotid bulb. Left ICA patent distally without stenosis, dissection or occlusion. VERTEBRAL ARTERIES: Both vertebral arteries arise from the subclavian arteries. Innominate and proximal right subclavian arteries are widely patent. Multifocal atheromatous irregularity throughout the proximal left subclavian artery without high-grade stenosis. Atheromatous irregularity about the origins of both vertebral arteries, with  associated moderate approximate 50% stenosis on the left and no more than mild narrowing on the right. Otherwise, vertebral arteries patent within the neck without stenosis, evidence for dissection or occlusion. Atheromatous irregularity noted about the right V3 segment without stenosis. Other: None. IMPRESSION: 1. Atheromatous irregularity about the right carotid bulb with associated mild 35% stenosis at the origin of the cervical right ICA. 2. Otherwise wide patency of both carotid artery systems within the neck. 3. Atheromatous change about the origins of both vertebral arteries, with associated moderate approximate 50% stenosis on the left and no more than mild narrowing on the right. Electronically Signed   By: Jeannine Boga M.D.   On: 10/11/2020 22:24   MR BRAIN WO CONTRAST  Result Date: 10/11/2020 CLINICAL DATA:  Initial evaluation for neuro deficit, stroke suspected. EXAM: MRI HEAD WITHOUT CONTRAST TECHNIQUE: Multiplanar, multiecho pulse sequences of the brain and surrounding structures were obtained  without intravenous contrast. COMPARISON:  CT from 10/10/2020. FINDINGS: Brain: Cerebral volume within normal limits for age. Patchy T2/FLAIR hyperintensity within the periventricular deep white matter both cerebral hemispheres most consistent with chronic small vessel ischemic disease, mild to moderate in nature. Apparent focus of FLAIR signal abnormality at the left cerebellopontine angle is not seen on corresponding sequences, favored to be artifactual in nature (series 6, image 10). No abnormal foci of restricted diffusion to suggest acute or subacute ischemia. Gray-white matter differentiation maintained. No encephalomalacia to suggest chronic cortical infarction. No evidence for acute or chronic intracranial hemorrhage. No mass lesion, midline shift or mass effect. Mild ventricular prominence related to global parenchymal volume loss of hydrocephalus. No extra-axial fluid collection. Pituitary gland suprasellar region normal. Midline structures intact and normal. Vascular: Major intracranial vascular flow voids are well maintained. Skull and upper cervical spine: Craniocervical junction normal. Visualized bone marrow signal intensity within normal limits. No visible focal marrow replacing lesion. No scalp soft tissue abnormality. Sinuses/Orbits: Globes and orbital soft tissues within normal limits. Scattered mucosal thickening noted throughout the paranasal sinuses. No significant mastoid effusion. Inner ear structures grossly normal. Other: None. IMPRESSION: 1. No acute intracranial abnormality. 2. Mild to moderate chronic microvascular ischemic disease. Electronically Signed   By: Jeannine Boga M.D.   On: 10/11/2020 22:17   DG Chest Portable 1 View  Result Date: 10/10/2020 CLINICAL DATA:  Shortness of breath EXAM: PORTABLE CHEST 1 VIEW COMPARISON:  04/04/2020 FINDINGS: Cardiac shadow is mildly prominent but stable. Aortic calcifications are seen. The lungs are well aerated bilaterally. No focal  infiltrate or effusion is noted. No bony abnormality is noted. IMPRESSION: No active disease. Electronically Signed   By: Inez Catalina M.D.   On: 10/10/2020 17:21   ECHOCARDIOGRAM COMPLETE  Result Date: 10/11/2020    ECHOCARDIOGRAM REPORT   Patient Name:   Keith Olson. Date of Exam: 10/11/2020 Medical Rec #:  [id number]        Height:       66.0 in Accession #:    [id number]       Weight:       152.0 lb Date of Birth:  1940-04-11        BSA:          1.780 m Patient Age:    77 years         BP:           200/83 mmHg Patient Gender: M                HR:  58 bpm. Exam Location:  [facility] Procedure: 2D Echo, Cardiac Doppler and Color Doppler Indications:    CVA  History:        Patient has prior history of Echocardiogram examinations, most                 recent 04/08/2020. CAD, Signs/Symptoms:Shortness of Breath and                 Dizziness, fall, bone cancer; Risk Factors:Hypertension.  Sonographer:    Dustin Flock RDCS Referring Phys: [place]  1. Left ventricular ejection fraction, by estimation, is 55 to 60%. The left ventricle has normal function. The left ventricle has no regional wall motion abnormalities. There is mild left ventricular hypertrophy. Left ventricular diastolic parameters are consistent with Grade I diastolic dysfunction (impaired relaxation).  2. Right ventricular systolic function is normal. The right ventricular size is normal. Tricuspid regurgitation signal is inadequate for assessing PA pressure.  3. The mitral valve is normal in structure. Trivial mitral valve regurgitation. No evidence of mitral stenosis.  4. The aortic valve is tricuspid. Aortic valve regurgitation is mild. No aortic stenosis is present.  5. The inferior vena cava is normal in size with greater than 50% respiratory variability, suggesting right atrial pressure of 3 mmHg. FINDINGS  Left Ventricle: Left ventricular ejection fraction, by estimation, is 55 to 60%. The left  ventricle has normal function. The left ventricle has no regional wall motion abnormalities. The left ventricular internal cavity size was normal in size. There is  mild left ventricular hypertrophy. Left ventricular diastolic parameters are consistent with Grade I diastolic dysfunction (impaired relaxation). Right Ventricle: The right ventricular size is normal. Right ventricular systolic function is normal. Tricuspid regurgitation signal is inadequate for assessing PA pressure. The tricuspid regurgitant velocity is 2.50 m/s, and with an assumed right atrial  pressure of 3 mmHg, the estimated right ventricular systolic pressure is 27.0 mmHg. Left Atrium: Left atrial size was normal in size. Right Atrium: Right atrial size was normal in size. Pericardium: There is no evidence of pericardial effusion. Mitral Valve: The mitral valve is normal in structure. Trivial mitral valve regurgitation. No evidence of mitral valve stenosis. Tricuspid Valve: The tricuspid valve is normal in structure. Tricuspid valve regurgitation is trivial. No evidence of tricuspid stenosis. Aortic Valve: The aortic valve is tricuspid. Aortic valve regurgitation is mild. Aortic regurgitation PHT measures 684 msec. No aortic stenosis is present. Pulmonic Valve: The pulmonic valve was normal in structure. Pulmonic valve regurgitation is trivial. No evidence of pulmonic stenosis. Aorta: The aortic root is normal in size and structure. Venous: The inferior vena cava is normal in size with greater than 50% respiratory variability, suggesting right atrial pressure of 3 mmHg. IAS/Shunts: No atrial level shunt detected by color flow Doppler.  LEFT VENTRICLE PLAX 2D LVIDd:         4.89 cm  Diastology LVIDs:         3.02 cm  LV e' medial:    6.13 cm/s LV PW:         1.22 cm  LV E/e' medial:  11.1 LV IVS:        1.36 cm  LV e' lateral:   5.96 cm/s LVOT diam:     2.30 cm  LV E/e' lateral: 11.4 LV SV:         92 LV SV Index:   52 LVOT Area:     4.15 cm   RIGHT VENTRICLE RV  Basal diam:  2.63 cm RV S prime:     17.90 cm/s TAPSE (M-mode): 1.6 cm LEFT ATRIUM             Index       RIGHT ATRIUM          Index LA diam:        3.20 cm 1.80 cm/m  RA Area:     9.93 cm LA Vol (A2C):   35.7 ml 20.06 ml/m RA Volume:   19.40 ml 10.90 ml/m LA Vol (A4C):   25.0 ml 14.05 ml/m LA Biplane Vol: 32.2 ml 18.09 ml/m  AORTIC VALVE LVOT Vmax:   90.30 cm/s LVOT Vmean:  63.200 cm/s LVOT VTI:    0.221 m AI PHT:      684 msec  AORTA Ao Root diam: 3.60 cm MITRAL VALVE               TRICUSPID VALVE MV Area (PHT): 3.42 cm    TR Peak grad:   25.0 mmHg MV Decel Time: 222 msec    TR Vmax:        250.00 cm/s MV E velocity: 68.10 cm/s MV A velocity: 90.30 cm/s  SHUNTS MV E/A ratio:  0.75        Systemic VTI:  0.22 m                            Systemic Diam: 2.30 cm Kirk Ruths MD Electronically signed by Kirk Ruths MD Signature Date/Time: 10/11/2020/10:06:12 AM    Final         Scheduled Meds:   stroke: mapping our early stages of recovery book   Does not apply Once   abiraterone acetate  1,000 mg Oral Daily   aspirin EC  325 mg Oral Daily   Chlorhexidine Gluconate Cloth  6 each Topical Daily   dronabinol  2.5 mg Oral BID AC   DULoxetine  60 mg Oral Daily   enoxaparin (LOVENOX) injection  40 mg Subcutaneous Q24H   [START ON 10/13/2020] fentaNYL  1 patch Transdermal Q72H   fluticasone  2 spray Each Nare Daily   gabapentin  400 mg Oral TID   linaclotide  145 mcg Oral QAC breakfast   LORazepam  0.5 mg Intravenous Once   potassium chloride  20 mEq Oral BID   Continuous Infusions:  potassium chloride 10 mEq (10/12/20 0859)     LOS: 2 days   Flora Lipps, MD [facility]  If 7PM-7AM, please contact night-coverage [email or web address] 10/12/2020, 9:04 AM

## 2020-10-12 NOTE — Progress Notes (Signed)
Pt critical Lab potassium 2.7 MD C.Hall notified by [facility] new orders given see MAR.

## 2020-10-12 NOTE — Progress Notes (Signed)
PT Cancellation Note  Patient Details Name: Keith Olson. MRN: [id number] DOB: 1940-05-11   Cancelled Treatment:    Reason Eval/Treat Not Completed: Other (comment)  Discussed pt status with RN, who is working to get his pain under control;   Will follow up later today as time allows -- hopeful to return this afternoon;  Otherwise, will follow up for PT tomorrow;   Thank you,  Roney Marion, PT  Acute Rehabilitation Services Pager [phone] Office [phone]    [place] 10/12/2020, 9:16 AM

## 2020-10-12 NOTE — Evaluation (Signed)
Occupational Therapy Evaluation Patient Details Name: Keith Olson. MRN: [id number] DOB: 1940/10/24 Today's Date: 10/12/2020    History of Present Illness Keith Olson. is a 80 y.o. 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   Clinical Impression   PTA, pt lives with daughter and reports typically Modified Independent with ADLs and mobility using RW. Pt endorses frequent falls though pt feels it may be due to getting "feet tangled". Pt denies any dizziness with OOB activity. Pt overall Min A for transfers/short mobility using RW, limited in standing tolerance during ADLs due to R hip pain. Pt requires Setup for UB ADLs and Mod A for LB ADLs due to deficits noted below. Pt hopeful to be able to progress home with HHOT follow-up. Wheelchair may be beneficial for pain mgmt in long distance mobility needs. Will continue to follow acutely to progress independence.     Follow Up Recommendations  Home health OT;Supervision/Assistance - 24 hour    Equipment Recommendations  Wheelchair (measurements OT);Wheelchair cushion (measurements OT)    Recommendations for Other Services       Precautions / Restrictions Precautions Precautions: Fall Restrictions Weight Bearing Restrictions: No      Mobility Bed Mobility Overal bed mobility: Needs Assistance Bed Mobility: Supine to Sit     Supine to sit: Min guard;HOB elevated     General bed mobility comments: min guard to sit  EOB, noted difficulty gaining balance with posterior lean, with increased time, able to self correct and gain balance    Transfers Overall transfer level: Needs assistance Equipment used: Rolling walker (2 wheeled) Transfers: Sit to/from Stand Sit to Stand: Min assist         General transfer comment: Min A for steadying assist due to posterior bias in sit to stand transfers, cues for hand placement    Balance Overall balance assessment: Needs assistance;History of Falls Sitting-balance support: No upper extremity supported;Feet supported Sitting balance-Leahy Scale: Poor Sitting balance - Comments: initially poor balance with posterior lean, able to gain balance statically within 2 min   Standing balance support: Bilateral upper extremity supported;During functional activity Standing balance-Leahy Scale: Poor Standing balance comment: reliant on UE support + external assist                           ADL either performed or assessed with clinical judgement   ADL Overall ADL's : Needs assistance/impaired Eating/Feeding: Independent;Sitting   Grooming: Min guard;Standing;Oral care Grooming Details (indicate cue type and reason): min guard, some slower problem solving and sequencing noted. unable to stand for duration of task so performed brushing seated at sink Upper Body Bathing: Set up;Sitting   Lower Body Bathing: Sit to/from stand;Moderate assistance   Upper Body Dressing : Set up;Sitting   Lower Body Dressing: Moderate assistance;Sit to/from stand   Toilet Transfer: Minimal assistance;Ambulation;RW   Toileting- Clothing Manipulation and Hygiene: Minimal assistance;Sit to/from stand       Functional mobility during ADLs: Minimal assistance;+2 for safety/equipment;Rolling walker General ADL  Comments: limited by balance deficits, pain in R hip in standing, tremors noted impacting tasks     Vision Baseline Vision/History: Wears glasses Wears Glasses: At  all times Patient Visual Report: No change from baseline Vision Assessment?: No apparent visual deficits Additional Comments: able to track visual target in all planes     Perception     Praxis      Pertinent Vitals/Pain Pain Assessment: 0-10 Pain Score: 10-Worst pain ever Pain Location: R hip in standing Pain Descriptors / Indicators: Grimacing;Guarding Pain Intervention(s): Monitored during session;Premedicated before session;Limited activity within patient's tolerance     Hand Dominance Left   Extremity/Trunk Assessment Upper Extremity Assessment Upper Extremity Assessment: Generalized weakness;LUE deficits/detail;RUE deficits/detail RUE Deficits / Details: tremulous with activity though could have been triggered by pain, finger to nose test impaired RUE Coordination: decreased fine motor LUE Deficits / Details: impaired shoulder AROM, about 80* flexion, reports pain with L shoulder since fall about 2-3 months ago, denies having any xrays done. tremulous with activity though could have been triggered by pain. finger to nose test impaired. R > L LUE Coordination: decreased fine motor;decreased gross motor   Lower Extremity Assessment Lower Extremity Assessment: Defer to PT evaluation   Cervical / Trunk Assessment Cervical / Trunk Assessment: Normal   Communication Communication Communication: No difficulties   Cognition Arousal/Alertness: Awake/alert Behavior During Therapy: WFL for tasks assessed/performed;Flat affect Overall Cognitive Status: No family/caregiver present to determine baseline cognitive functioning Area of Impairment: Following commands;Safety/judgement;Awareness;Problem solving                       Following Commands: Follows one step commands with increased time Safety/Judgement: Decreased awareness of safety;Decreased awareness of deficits Awareness: Emergent Problem Solving: Slow processing;Difficulty sequencing;Requires verbal  cues;Requires tactile cues General Comments: Very pleasant, minor problem solving deficits noted with ADLs though could also have been impacted due to pain   General Comments       Exercises     Shoulder Instructions      Home Living Family/patient expects to be discharged to:: Private residence Living Arrangements: Children Available Help at Discharge: Family Type of Home: House Home Access: Stairs to enter [employer] of Steps: 3 Entrance Stairs-Rails: Right;Left;Can reach both Home Layout: Two level;Able to live on main level with bedroom/bathroom     Bathroom Shower/Tub: Teacher, early years/pre: Standard Bathroom Accessibility: Yes   Home Equipment: Cane - single point;Walker - 2 wheels   Additional Comments: pt reports he lives with daughter who recently had sx - will return to work in 2 weeks      Prior Functioning/Environment Level of Independence: Needs assistance  Gait / Transfers Assistance Needed: use of RW for mobility, frequent falls (may be due to feet getting tangled per pt) ADL's / Homemaking Assistance Needed: Reports Mod I for ADLs (sponge bathes, afraid to get into shower after fall in tub). Will assist some with cleaning, laundry though daughter completes most of this            OT Problem List: Decreased strength;Decreased activity tolerance;Impaired balance (sitting and/or standing);Decreased cognition;Decreased coordination;Decreased safety awareness;Pain;Decreased range of motion      OT Treatment/Interventions: Self-care/ADL training;Therapeutic exercise;Energy conservation;DME and/or AE instruction;Therapeutic activities;Patient/family education;Balance training    OT Goals(Current goals can be found in the care plan section) Acute Rehab OT Goals Patient Stated Goal: decrease pain, decrease falls OT Goal Formulation: With patient Time For Goal Achievement: 10/26/20 Potential to Achieve Goals: Good  OT Frequency: Min  2X/week   Barriers to D/C:            Co-evaluation PT/OT/SLP Co-Evaluation/Treatment: Yes Reason for Co-Treatment: To address functional/ADL transfers;For patient/therapist safety;Other (comment) (frequent falls at home)   OT goals addressed during session: ADL's and self-care      AM-PAC OT "6 Clicks" Daily Activity     Outcome Measure Help from another person eating meals?: None Help from another person taking care of personal grooming?: A Little Help from another person toileting, which includes using toliet, bedpan, or urinal?: A Little Help from another person bathing (including washing, rinsing, drying)?: A Lot Help from another person to put on and taking off regular upper body clothing?: A Little Help from another person to put on and taking off regular lower body clothing?: A Lot 6 Click Score: 17   End of Session Equipment Utilized During Treatment: Gait belt;Rolling walker Nurse Communication: Mobility status  Activity Tolerance: Patient tolerated treatment well Patient left: in chair;with call bell/phone within reach;with chair alarm set  OT Visit Diagnosis: Unsteadiness on feet (R26.81);Other abnormalities of gait and mobility (R26.89);Muscle weakness (generalized) (M62.81);History of falling (Z91.81);Repeated falls (R29.6);Pain Pain - Right/Left: Right Pain - part of body: Hip                Time: 3545-6256 OT Time Calculation (min): 37 min Charges:  OT General Charges $OT Visit: 1 Visit OT Evaluation $OT Eval Moderate Complexity: 1 Mod  Malachy Chamber, OTR/L Acute Rehab Services Office: [phone]   Layla Maw 10/12/2020, 2:34 PM

## 2020-10-12 NOTE — Plan of Care (Signed)
  Problem: Education: Goal: Knowledge of General Education information will improve Description Including pain rating scale, medication(s)/side effects and non-pharmacologic comfort measures Outcome: Progressing   

## 2020-10-12 NOTE — Evaluation (Signed)
Physical Therapy Evaluation Patient Details Name: Keith Olson. MRN: [id number] DOB: [birth year] Today's Date: 10/12/2020   History of Present Illness  Keith Olson. is a 80 y.o. 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  Clinical Impression   Pt admitted with above diagnosis. Comes from home where he lives with daughter; Typically uses RW for amb;  PPresents to PT with pain limiting amb and activity tolerance;  currently with functional limitations due to the deficits listed below (see PT Problem List). Pt will benefit from skilled PT to increase their independence and safety with mobility to allow discharge to the venue listed below.       Follow Up Recommendations Home health PT;Supervision/Assistance - 24 hour    Equipment Recommendations  Wheelchair (measurements PT);Wheelchair cushion (measurements PT);Hospital bed;Other (comment) (Will consider Rollator RW, but must try it first -- pt may not be able to control it)    Recommendations for Other Services Other (comment) (Palliative Medicine team)     Precautions / Restrictions Precautions Precautions: Fall Restrictions Weight Bearing Restrictions: No      Mobility  Bed Mobility Overal bed mobility: Needs Assistance Bed Mobility: Supine to Sit     Supine to sit: Min guard;HOB elevated     General bed mobility comments: min guard to sit EOB, noted difficulty gaining balance with posterior lean, with  increased time, able to self correct and gain balance    Transfers Overall transfer level: Needs assistance Equipment used: Rolling walker (2 wheeled) Transfers: Sit to/from Stand Sit to Stand: Min assist         General transfer comment: Min A for steadying assist due to posterior bias in sit to stand transfers, cues for hand placement  Ambulation/Gait Ambulation/Gait assistance: Min assist;Mod assist Gait Distance (Feet): 14 Feet (with seated rest break at sink) Assistive device: Rolling walker (2 wheeled)       General Gait Details: short, small steps; decr step height leading to foot clearance problems; min to light mod assist with balance  Stairs            Wheelchair Mobility    Modified Rankin (Stroke Patients Only)       Balance Overall balance assessment: Needs assistance;History of Falls Sitting-balance support: No upper extremity supported;Feet supported Sitting balance-Leahy Scale: Poor Sitting balance - Comments: initially poor balance with posterior lean, able to gain balance statically within 2 min   Standing balance support: Bilateral upper extremity supported;During functional activity Standing balance-Leahy Scale: Poor Standing balance comment: reliant on UE support + external assist                             Pertinent Vitals/Pain Pain Assessment: 0-10 Pain Score: 10-Worst pain ever Pain Location: R hip in standing/weight bearing Pain Descriptors / Indicators: Grimacing;Guarding Pain Intervention(s): Monitored during session;Limited activity within patient's tolerance    Home Living Family/patient expects to be discharged to:: Private residence Living Arrangements: Children Available Help at Discharge: Family Type of Home:  House Home Access: Stairs to enter Entrance Stairs-Rails: Right;Left;Can reach both Entrance Stairs-Number of Steps: 3 Home Layout: Two level;Able to live on main level with bedroom/bathroom Home  Equipment: Kasandra Knudsen - single point;Walker - 2 wheels Additional Comments: pt reports he lives with daughter who recently had sx - will return to work in 2 weeks    Prior Function Level of Independence: Needs assistance   Gait / Transfers Assistance Needed: use of RW for mobility, frequent falls (may be due to feet getting tangled per pt)  ADL's / Homemaking Assistance Needed: Reports Mod I for ADLs (sponge bathes, afraid to get into shower after fall in tub). Will assist some with cleaning, laundry though daughter completes most of this        Hand Dominance   Dominant Hand: Left    Extremity/Trunk Assessment   Upper Extremity Assessment Upper Extremity Assessment: Defer to OT evaluation RUE Deficits / Details: tremulous with activity though could have been triggered by pain, finger to nose test impaired RUE Coordination: decreased fine motor LUE Deficits / Details: impaired shoulder AROM, about 80* flexion, reports pain with L shoulder since fall about 2-3 months ago, denies having any xrays done. tremulous with activity though could have been triggered by pain. finger to nose test impaired. 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) RLE Deficits / Details: Very painful R hip with weight bearing    Cervical / Trunk Assessment Cervical / Trunk Assessment: Normal  Communication   Communication: No difficulties  Cognition Arousal/Alertness: Awake/alert Behavior During Therapy: WFL for tasks assessed/performed;Flat affect Overall Cognitive Status: No family/caregiver present to determine baseline cognitive functioning Area of Impairment: Following commands;Safety/judgement;Awareness;Problem solving                       Following Commands: Follows one step commands with increased  time Safety/Judgement: Decreased awareness of safety;Decreased awareness of deficits Awareness: Emergent Problem Solving: Slow processing;Difficulty sequencing;Requires verbal cues;Requires tactile cues General Comments: Very pleasant, minor problem solving deficits noted with ADLs though could also have been impacted due to pain      General Comments General comments (skin integrity, edema, etc.): Quite uninterested in going to SNF    Exercises     Assessment/Plan    PT Assessment Patient needs continued PT services  PT Problem List Decreased strength;Decreased range of motion;Decreased activity tolerance;Decreased balance;Decreased mobility;Decreased coordination;Decreased cognition;Decreased knowledge of use of DME;Decreased safety awareness;Decreased knowledge of precautions;Pain       PT Treatment Interventions DME instruction;Gait training;Functional mobility training;Stair training;Therapeutic activities;Therapeutic exercise;Balance training;Cognitive remediation;Patient/family education    PT Goals (Current goals can be found in the Care Plan section)  Acute Rehab PT Goals Patient Stated Goal: decrease pain, decrease falls PT Goal Formulation: With patient Time For Goal Achievement: 10/26/20 Potential to Achieve Goals: Good    Frequency Min 3X/week   Barriers to discharge        Co-evaluation PT/OT/SLP Co-Evaluation/Treatment: Yes Reason for Co-Treatment: For patient/therapist safety;To address functional/ADL transfers PT goals addressed during session: Mobility/safety with mobility OT goals addressed during session: ADL's and self-care       AM-PAC PT "6 Clicks" Mobility  Outcome Measure Help needed turning from your back to your side while in a flat bed without using bedrails?: None Help needed moving from lying on your back to sitting on the side  of a flat bed without using bedrails?: A Little Help needed moving to and from a bed to a chair (including a  wheelchair)?: A Little Help needed standing up from a chair using your arms (e.g., wheelchair or bedside chair)?: A Little Help needed to walk in hospital room?: A Little Help needed climbing 3-5 steps with a railing? : A Lot 6 Click Score: 18    End of Session Equipment Utilized During Treatment: Gait belt Activity Tolerance: Patient limited by pain Patient left: in chair;with call bell/phone within reach;with chair alarm set Nurse Communication: Mobility status;Patient requests pain meds PT Visit Diagnosis: Unsteadiness on feet (R26.81);Repeated falls (R29.6);Pain Pain - Right/Left: Right Pain - part of body: Hip    Time: 1344-1414 PT Time Calculation (min) (ACUTE ONLY): 30 min   Charges:   PT Evaluation $PT Eval Moderate Complexity: 1 Mod          Roney Marion, [place]  Acute Rehabilitation Services Pager [phone] Office [phone]   Colletta [place] 10/12/2020, 5:16 PM

## 2020-10-13 LAB — BASIC METABOLIC PANEL
Anion gap: 9 (ref 5–15)
BUN: 8 mg/dL (ref 8–23)
CO2: 23 mmol/L (ref 22–32)
Calcium: 8 mg/dL — ABNORMAL LOW (ref 8.9–10.3)
Chloride: 106 mmol/L (ref 98–111)
Creatinine, Ser: 0.5 mg/dL — ABNORMAL LOW (ref 0.61–1.24)
GFR, Estimated: >60 mL/min (ref >60)
Glucose, Bld: 101 mg/dL — ABNORMAL HIGH (ref 70–99)
Potassium: 3.5 mmol/L (ref 3.5–5.1)
Sodium: 138 mmol/L (ref 135–145)

## 2020-10-13 LAB — MAGNESIUM: Magnesium: 2.5 mg/dL — ABNORMAL HIGH (ref 1.7–2.4)

## 2020-10-13 LAB — CBC
HCT: 40.5 % (ref 39.0–52.0)
Hemoglobin: 14 g/dL (ref 13.0–17.0)
MCH: 31.2 pg (ref 26.0–34.0)
MCHC: 34.6 g/dL (ref 30.0–36.0)
MCV: 90.2 fL (ref 80.0–100.0)
Platelets: 245 10*3/uL (ref 150–400)
RBC: 4.49 MIL/uL (ref 4.22–5.81)
RDW: 12.4 % (ref 11.5–15.5)
WBC: 6.4 10*3/uL (ref 4.0–10.5)
nRBC: 0 % (ref 0.0–0.2)

## 2020-10-13 MED ORDER — OSCAL 500/200 D-3 500-200 MG-UNIT PO TABS: 1.0000 | ORAL_TABLET | Freq: Two times a day (BID) | ORAL | 2 refills | Status: AC

## 2020-10-13 MED ORDER — MUSCLE RUB 10-15 % EX CREA
TOPICAL_CREAM | CUTANEOUS | Status: DC | PRN
  Administered 2020-10-13: 1 via TOPICAL
  Filled 2020-10-13 (×2): qty 85

## 2020-10-13 MED ORDER — VITAMIN D (ERGOCALCIFEROL) 1.25 MG (50000 UNIT) PO CAPS
50000.0000 [IU] | ORAL_CAPSULE | Freq: Once | ORAL | Status: AC
  Administered 2020-10-13: 50000 [IU] via ORAL
  Filled 2020-10-13: qty 1

## 2020-10-13 MED ORDER — POTASSIUM CHLORIDE CRYS ER 20 MEQ PO TBCR: 20.0000 meq | EXTENDED_RELEASE_TABLET | Freq: Every day | ORAL | 0 refills | Status: DC

## 2020-10-13 NOTE — Evaluation (Signed)
Speech Language Pathology Evaluation Patient Details Name: Keith Olson. MRN: [id number] DOB: Jun 19, 1940 Today's Date: 10/13/2020 Time: 5625-6389 SLP Time Calculation (min) (ACUTE ONLY): 22 min  Problem List:  Patient Active Problem List   Diagnosis Date Noted   Fall 10/10/2020   Hypokalemia 10/10/2020   Dysmetria 10/10/2020   Bacteremia due to Staphylococcus aureus 04/09/2020   Staphylococcus aureus bacteremia 04/07/2020   Community acquired pneumonia 04/04/2020   DNR (do not resuscitate) 04/04/2020   Acute bacterial sinusitis 04/04/2020   Leukocytosis 04/04/2020   Weakness 07/22/2019   Acute lower UTI 07/22/2019   Protein calorie malnutrition ([place]) 07/22/2019   Urinary retention 07/18/2019   Hyponatremia 06/29/2019   Acute bilateral low back pain with sciatica    Multiple lung nodules on CT    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[place]) 06/27/2019   Unstable angina ([place])    History of depression 09/05/2017   Hypothyroidism 09/05/2017   Depression, major, single episode, moderate ([place]) 11/26/2016   Prostatitis 11/26/2016   Rectal bleeding 02/25/2016   Pain management contract signed 01/27/2016   Constipation 04/22/2015   Lower abdominal pain 04/22/2015   Anxiety state 12/16/2014   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   Hyperlipidemia 05/22/2012   BPH (benign prostatic hyperplasia) 05/22/2012   Chest pain 02/24/2012   CAD (coronary artery disease) 02/24/2012   Past Medical History:  Past Medical History:  Diagnosis Date   Agent orange exposure    Arthritis    Coronary artery disease    BMS to mid LAD 2001, DES to proximal LAD 2017   Enlarged prostate    XRT 2016   Headache    Sinus headaches    Heart abnormality    History of depression    Hyperlipidemia    Hypothyroidism    Prostate cancer ([place]) 07/2014   hormonal therapy, external beam radiation therapy    PTSD (post-traumatic stress disorder)    Past Surgical History:  Past Surgical History:  Procedure Laterality Date   BRONCHIAL BRUSHINGS  07/02/2019   Procedure: BRONCHIAL BRUSHINGS;  Surgeon: Rigoberto Noel, MD;  Location: [facility];  Service: Cardiopulmonary;;   BRONCHIAL NEEDLE ASPIRATION BIOPSY  07/02/2019   Procedure: BRONCHIAL NEEDLE ASPIRATION BIOPSIES;  Surgeon: Rigoberto Noel, MD;  Location: [facility];  Service: Cardiopulmonary;;   BRONCHIAL WASHINGS  07/02/2019   Procedure: BRONCHIAL WASHINGS;  Surgeon: Rigoberto Noel, MD;  Location: [facility];  Service: Cardiopulmonary;;   CARDIAC CATHETERIZATION N/A 12/12/2015   Procedure: Left Heart Cath and Coronary Angiography;  Surgeon: Peter M Martinique, MD;  Location: [facility];  Service: Cardiovascular;  Laterality: N/A;   CARDIAC CATHETERIZATION N/A 12/12/2015   Procedure: Coronary Stent Intervention;  Surgeon: Peter M Martinique, MD;  Location: [facility];  Service: Cardiovascular;  Laterality: N/A;   CORONARY STENT INTERVENTION N/A 09/06/2017   Procedure: CORONARY STENT INTERVENTION;  Surgeon: Burnell Blanks, MD;  Location: [facility];  Service: Cardiovascular;  Laterality: N/A;   ENDOBRONCHIAL ULTRASOUND N/A 07/02/2019   Procedure: ENDOBRONCHIAL ULTRASOUND;  Surgeon: Rigoberto Noel, MD;  Location: [facility];  Service: Cardiopulmonary;  Laterality: N/A;   FLEXIBLE BRONCHOSCOPY  07/02/2019   Procedure: FLEXIBLE BRONCHOSCOPY;  Surgeon: Rigoberto Noel, MD;  Location: [facility];  Service: Cardiopulmonary;;   HERNIA REPAIR Right    LEFT HEART CATH AND CORONARY ANGIOGRAPHY N/A 09/02/2016   Procedure: Left Heart Cath and Coronary Angiography;  Surgeon: Leonie Man, MD;  Location: [facility];  Service: Cardiovascular;  Laterality: N/A;   LEFT HEART CATH AND CORONARY ANGIOGRAPHY N/A 09/06/2017   Procedure: LEFT HEART CATH AND CORONARY ANGIOGRAPHY;  Surgeon: Burnell Blanks, MD;  Location: [facility];  Service: Cardiovascular;  Laterality: N/A;   LEFT HEART CATH AND CORONARY ANGIOGRAPHY N/A 05/04/2019   Procedure: LEFT HEART CATH AND CORONARY ANGIOGRAPHY;  Surgeon: Martinique, Peter M, MD;  Location: [facility];  Service: Cardiovascular;  Laterality: N/A;   PROSTATE BIOPSY N/A 08/28/2014   Procedure: BIOPSY TRANSRECTAL ULTRASONIC PROSTATE (TUBP);  Surgeon: Rana Snare, MD;  Location: [facility];  Service: Urology;  Laterality: N/A;   TEE WITHOUT CARDIOVERSION N/A 04/08/2020   Procedure: TRANSESOPHAGEAL ECHOCARDIOGRAM (TEE) WITH PROPOFOL;  Surgeon: Arnoldo Lenis, MD;  [facility];  Service: Endoscopy;  Laterality: N/A;   TRANSURETHRAL RESECTION OF PROSTATE N/A 08/28/2014   Procedure: TRANSURETHRAL RESECTION OF THE PROSTATE WITH GYRUS INSTRUMENTS;  Surgeon: Rana Snare, MD;  Location: [facility];  Service: Urology;  Laterality: N/A;   HPI:  80 y.o. 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  Patient seen for shortness of breath that has been going on for the past 3 days with disequilibrium that started approximately 2 to 3 weeks ago.  Patient denies dyspnea on exertion or worsening shortness of breath with activity.  He is unable to specifically talk about his shortness of breath other than that he feels increased effort with breathing.  He does report feeling lightheaded, off balance, dizzy and falling 10-15 times throughout the day.  Patient states that he is falling to the right and has weakness in his right leg.  His symptoms have been worsening over the past 2 to 3 weeks. SLE generated to assess cognitive status.  Assessment / Plan / Recommendation Clinical Impression  Pt assessed via SLUMS (Aspers Mental Status Examination) with a score obtained of 15/30 with deficits noted on memory tasks with recall with a time delay (1/5 items recalled) as well as paragraph retention being 50% accurate.  He was able  to complete simple calculation task and repeat up to 3 digits backwards.  He was oriented x4 and speech was 100% intelligible in simple conversation.  Pt made statements such as "my memory is not good with new information and short-term memory."  Verbal expression appears to be [facility] and he was able to follow directives related to functional tasks.  Pt currently lives in his home with his daughter, who assists with his care.  Pt is likely functioning at his baseline for cognition.  IF symptoms persist, an outpatient assessment of cognition may be warranted.  Thank you for this consultation.    SLP Assessment  SLP Recommendation/Assessment: All further Speech Language Pathology  needs can be addressed in the next venue of care SLP Visit Diagnosis: Cognitive communication deficit (R41.841)    Follow Up Recommendations  Other (comment) (TBD)    Frequency and Duration Other (Comment) (evaluation only)         SLP Evaluation Cognition  Overall Cognitive Status: History of cognitive impairments - at baseline Arousal/Alertness: Awake/alert Orientation Level: Oriented X4 Attention: Sustained Sustained Attention: Impaired Sustained Attention Impairment: Verbal complex;Functional complex Memory: Impaired Memory Impairment: Retrieval deficit;Decreased recall of new information;Decreased short term memory Decreased Short Term Memory: Verbal basic;Functional basic Immediate Memory Recall: Sock;Blue;Bed Memory Recall Sock: Without Cue Memory Recall Blue: With  Cue Memory Recall Bed: Not able to recall Awareness: Appears intact Problem Solving: Appears intact Behaviors: Perseveration Safety/Judgment: Appears intact       Comprehension  Auditory Comprehension Overall Auditory Comprehension: Appears within functional limits for tasks assessed Yes/No Questions: Within Functional Limits Commands: Within Functional Limits Conversation: Complex Visual Recognition/Discrimination Discrimination: Not  tested Reading Comprehension Reading Status: Not tested    Expression Expression Primary Mode of Expression: Verbal Verbal Expression Overall Verbal Expression: Appears within functional limits for tasks assessed Level of Generative/Spontaneous Verbalization: Conversation [employer] of Communication: Not applicable Written Expression Dominant Hand: Right Written Expression: Within Functional Limits   Oral / Motor  Oral Motor/Sensory Function Overall Oral Motor/Sensory Function: Within functional limits Motor Speech Overall Motor Speech: Appears within functional limits for tasks assessed Respiration: Within functional limits Phonation: Normal Resonance: Within functional limits Articulation: Within functional limitis Intelligibility: Intelligible Motor Planning: Witnin functional limits Motor Speech Errors: Not applicable                       Elvina Sidle, M.S., CCC-SLP 10/13/2020, 11:13 AM

## 2020-10-13 NOTE — Discharge Summary (Signed)
Physician Discharge Summary  Keith Olson. YPP:[id number] DOB: 1940-07-23 DOA: 10/10/2020  PCP: Dettinger, Fransisca Kaufmann, MD  Admit date: 10/10/2020 Discharge date: 10/13/2020  Admitted From: Home  Discharge disposition: Home   Recommendations for Outpatient Follow-Up:   Follow up with your primary care provider in one week.  Check CBC, BMP, magnesium in the next visit   Discharge Diagnosis:   Principal Problem:   Fall Active Problems:   Prostate cancer metastatic to bone [facility])   Anxiety state   Hypothyroidism   Hypokalemia   Dysmetria   Discharge Condition: Improved.  Diet recommendation: Low sodium, heart healthy.   Wound care: None.  Code status: Full.   History of Present Illness:   Patient is a 80 year old male with past medical history of hypertension, prostate cancer with metastatic disease to the bone, coronary artery disease presented to the hospital with shortness of breath and disequilibrium for 2 to 3 weeks.  CT head scan was negative.  Patient was then admitted to [facility] for MRI and possible neurological consultation.  Patient was noted to have a significant hypokalemia as well.    Hospital Course:   Following conditions were addressed during hospitalization as listed below,  Falls with disequilibrium  CVA ruled out. On xanax and pain meds at home but needs them due his his cancer and anxiety. 2D echocardiogram with LVEF 55-60% and grade 1 diastolic dysfunction. LDL 117, noted to have statin intolerance. Hemoglobin A1c of 5.8. PT/OT evaluation was consulted and PT recommended outpatient PT and OT.  TSH of 3.4.  Vitamin D-31.9, Vitamin B12 299.  MRI of the brain did not show any evidence of stroke.   Chronic pain syndrome Will resume dilaudid, fentanyl patch from home.  Patient has chronic pain from metastatic prostate cancer.  Feels like he is in mild withdrawal now due to lack of fentanyl patch.  Will resume.   Severe hypokalemia Was  replenished aggressively.  Potassium of 3.5 prior to discharge.   Hypothyroidism -TSH noted to be 3.479.  Not currently on levothyroxine   Hypertension Resume lisinopril.   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  Disposition.  At this time, patient is stable for disposition home with outpatient PCP and oncology follow-up.  Spoke with the patient's daughter on the phone and updated her about the plan for disposition and follow-up.  Medical Consultants:   None.  Procedures:    None Subjective:   Today, patient was seen and examined at bedside.  Wishes to go home.  Ambulated with physical therapy.  Discharge Exam:   Vitals:   10/13/20 0600 10/13/20 0844  BP: (!) 185/84 (!) 162/78  Pulse: 60 62  Resp: 16   Temp: 97.7 F (36.5 C)   SpO2: 95% 98%   Vitals:   10/12/20 0842 10/12/20 2030 10/13/20 0600 10/13/20 0844  BP: (!) 167/76 (!) 168/87 (!) 185/84 (!) 162/78  Pulse:  62 60 62  Resp:  16 16   Temp:  98.5 F (36.9 C) 97.7 F (36.5 C)   TempSrc:  Oral Oral   SpO2: 97% 95% 95% 98%  Weight:      Height:       General: Alert awake, not in obvious distress, mildly anxious HENT: pupils equally reacting to light,  No scleral pallor or icterus noted. Oral mucosa is moist.  Chest:  Clear breath sounds.  Diminished breath sounds bilaterally. No crackles or  wheezes.  CVS: S1 &S2 heard. No murmur.  Regular rate and rhythm. Abdomen: Soft, nontender, nondistended.  Bowel sounds are heard.   Extremities: No cyanosis, clubbing or edema.  Peripheral pulses are palpable. Psych: Alert, awake and oriented, flat affect. CNS:  No cranial nerve deficits.  Power equal in all extremities.   Skin: Warm and dry.  No rashes noted.  The results of significant diagnostics from this hospitalization (including imaging, microbiology, ancillary and laboratory) are listed below for reference.     Diagnostic  Studies:   CT HEAD WO CONTRAST  Result Date: 10/10/2020 CLINICAL DATA:  Dizziness, history of cancer EXAM: CT HEAD WITHOUT CONTRAST TECHNIQUE: Contiguous axial images were obtained from the base of the skull through the vertex without intravenous contrast. COMPARISON:  04/04/2020 FINDINGS: Brain: No evidence of acute infarction, hemorrhage, hydrocephalus, extra-axial collection or mass lesion/mass effect. Periventricular white matter changes, likely the sequela of chronic small vessel ischemic disease. Vascular: No hyperdense vessel or unexpected calcification. Skull: Normal. Negative for fracture or focal lesion. Sinuses/Orbits: Mucosal thickening in the left maxillary sinus and bilateral ethmoid air cells. The orbits are unremarkable. Other: None. IMPRESSION: No acute intracranial process. Electronically Signed   By: Merilyn Baba M.D.   On: 10/10/2020 16:41   MR ANGIO NECK W WO CONTRAST  Result Date: 10/11/2020 CLINICAL DATA:  Initial evaluation for neuro deficit, stroke suspected. History of prostate cancer. EXAM: MRA NECK WITHOUT AND WITH CONTRAST TECHNIQUE: Multiplanar and multiecho pulse sequences of the neck were obtained without and with intravenous contrast. Angiographic images of the neck were obtained using MRA technique without and with intravenous contrast. CONTRAST:  6.108m GADAVIST GADOBUTROL 1 MMOL/ML IV SOLN COMPARISON:  None available. FINDINGS: AORTIC ARCH: Visualized aortic arch normal caliber with normal branch pattern. No hemodynamically significant stenosis seen about the origin of the great vessels. RIGHT CAROTID SYSTEM: Right CCA patent from its origin to the bifurcation without stenosis. Atheromatous irregularity about the right carotid bulb with associated mild 35% stenosis at the origin of the cervical right ICA (series [id number], image 19). Right ICA patent distally without stenosis, evidence for dissection or occlusion. LEFT CAROTID SYSTEM: Left CCA patent from its origin to the  bifurcation without stenosis. No significant atheromatous narrowing about the left carotid bulb. Left ICA patent distally without stenosis, dissection or occlusion. VERTEBRAL ARTERIES: Both vertebral arteries arise from the subclavian arteries. Innominate and proximal right subclavian arteries are widely patent. Multifocal atheromatous irregularity throughout the proximal left subclavian artery without high-grade stenosis. Atheromatous irregularity about the origins of both vertebral arteries, with associated moderate approximate 50% stenosis on the left and no more than mild narrowing on the right. Otherwise, vertebral arteries patent within the neck without stenosis, evidence for dissection or occlusion. Atheromatous irregularity noted about the right V3 segment without stenosis. Other: None. IMPRESSION: 1. Atheromatous irregularity about the right carotid bulb with associated mild 35% stenosis at the origin of the cervical right ICA. 2. Otherwise wide patency of both carotid artery systems within the neck. 3. Atheromatous change about the origins of both vertebral arteries, with associated moderate approximate 50% stenosis on the left and no more than mild narrowing on the right. Electronically Signed   By: BJeannine BogaM.D.   On: 10/11/2020 22:24   MR BRAIN WO CONTRAST  Result Date: 10/11/2020 CLINICAL DATA:  Initial evaluation for neuro deficit, stroke suspected. EXAM: MRI HEAD WITHOUT CONTRAST TECHNIQUE: Multiplanar, multiecho pulse sequences of the brain and surrounding structures were obtained without intravenous contrast. COMPARISON:  CT from 10/10/2020. FINDINGS: Brain: Cerebral volume within normal limits for age. Patchy T2/FLAIR hyperintensity within the periventricular deep white matter both cerebral hemispheres most consistent with chronic small vessel ischemic disease, mild to moderate in nature. Apparent focus of FLAIR signal abnormality at the left cerebellopontine angle is not seen on  corresponding sequences, favored to be artifactual in nature (series 6, image 10). No abnormal foci of restricted diffusion to suggest acute or subacute ischemia. Gray-white matter differentiation maintained. No encephalomalacia to suggest chronic cortical infarction. No evidence for acute or chronic intracranial hemorrhage. No mass lesion, midline shift or mass effect. Mild ventricular prominence related to global parenchymal volume loss of hydrocephalus. No extra-axial fluid collection. Pituitary gland suprasellar region normal. Midline structures intact and normal. Vascular: Major intracranial vascular flow voids are well maintained. Skull and upper cervical spine: Craniocervical junction normal. Visualized bone marrow signal intensity within normal limits. No visible focal marrow replacing lesion. No scalp soft tissue abnormality. Sinuses/Orbits: Globes and orbital soft tissues within normal limits. Scattered mucosal thickening noted throughout the paranasal sinuses. No significant mastoid effusion. Inner ear structures grossly normal. Other: None. IMPRESSION: 1. No acute intracranial abnormality. 2. Mild to moderate chronic microvascular ischemic disease. Electronically Signed   By: Jeannine Boga M.D.   On: 10/11/2020 22:17   DG Chest Portable 1 View  Result Date: 10/10/2020 CLINICAL DATA:  Shortness of breath EXAM: PORTABLE CHEST 1 VIEW COMPARISON:  04/04/2020 FINDINGS: Cardiac shadow is mildly prominent but stable. Aortic calcifications are seen. The lungs are well aerated bilaterally. No focal infiltrate or effusion is noted. No bony abnormality is noted. IMPRESSION: No active disease. Electronically Signed   By: Inez Catalina M.D.   On: 10/10/2020 17:21   ECHOCARDIOGRAM COMPLETE  Result Date: 10/11/2020    ECHOCARDIOGRAM REPORT   Patient Name:   Keith Olson. Date of Exam: 10/11/2020 Medical Rec #:  [id number]        Height:       66.0 in Accession #:    [id number]       Weight:       152.0  lb Date of Birth:  [birth year]        BSA:          1.780 m Patient Age:    80 years         BP:           200/83 mmHg Patient Gender: M                HR:           58 bpm. Exam Location:  [facility] Procedure: 2D Echo, Cardiac Doppler and Color Doppler Indications:    CVA  History:        Patient has prior history of Echocardiogram examinations, most                 recent 04/08/2020. CAD, Signs/Symptoms:Shortness of Breath and                 Dizziness, fall, bone cancer; Risk Factors:Hypertension.  Sonographer:    Dustin Flock RDCS Referring Phys: [place]  1. Left ventricular ejection fraction, by estimation, is 55 to 60%. The left ventricle has normal function. The left ventricle has no regional wall motion abnormalities. There is mild left ventricular hypertrophy. Left ventricular diastolic parameters are consistent with Grade I diastolic dysfunction (impaired relaxation).  2. Right ventricular systolic function is normal. The right ventricular size is  normal. Tricuspid regurgitation signal is inadequate for assessing PA pressure.  3. The mitral valve is normal in structure. Trivial mitral valve regurgitation. No evidence of mitral stenosis.  4. The aortic valve is tricuspid. Aortic valve regurgitation is mild. No aortic stenosis is present.  5. The inferior vena cava is normal in size with greater than 50% respiratory variability, suggesting right atrial pressure of 3 mmHg. FINDINGS  Left Ventricle: Left ventricular ejection fraction, by estimation, is 55 to 60%. The left ventricle has normal function. The left ventricle has no regional wall motion abnormalities. The left ventricular internal cavity size was normal in size. There is  mild left ventricular hypertrophy. Left ventricular diastolic parameters are consistent with Grade I diastolic dysfunction (impaired relaxation). Right Ventricle: The right ventricular size is normal. Right ventricular systolic function is normal.  Tricuspid regurgitation signal is inadequate for assessing PA pressure. The tricuspid regurgitant velocity is 2.50 m/s, and with an assumed right atrial  pressure of 3 mmHg, the estimated right ventricular systolic pressure is 02.7 mmHg. Left Atrium: Left atrial size was normal in size. Right Atrium: Right atrial size was normal in size. Pericardium: There is no evidence of pericardial effusion. Mitral Valve: The mitral valve is normal in structure. Trivial mitral valve regurgitation. No evidence of mitral valve stenosis. Tricuspid Valve: The tricuspid valve is normal in structure. Tricuspid valve regurgitation is trivial. No evidence of tricuspid stenosis. Aortic Valve: The aortic valve is tricuspid. Aortic valve regurgitation is mild. Aortic regurgitation PHT measures 684 msec. No aortic stenosis is present. Pulmonic Valve: The pulmonic valve was normal in structure. Pulmonic valve regurgitation is trivial. No evidence of pulmonic stenosis. Aorta: The aortic root is normal in size and structure. Venous: The inferior vena cava is normal in size with greater than 50% respiratory variability, suggesting right atrial pressure of 3 mmHg. IAS/Shunts: No atrial level shunt detected by color flow Doppler.  LEFT VENTRICLE PLAX 2D LVIDd:         4.89 cm  Diastology LVIDs:         3.02 cm  LV e' medial:    6.13 cm/s LV PW:         1.22 cm  LV E/e' medial:  11.1 LV IVS:        1.36 cm  LV e' lateral:   5.96 cm/s LVOT diam:     2.30 cm  LV E/e' lateral: 11.4 LV SV:         92 LV SV Index:   52 LVOT Area:     4.15 cm  RIGHT VENTRICLE RV Basal diam:  2.63 cm RV S prime:     17.90 cm/s TAPSE (M-mode): 1.6 cm LEFT ATRIUM             Index       RIGHT ATRIUM          Index LA diam:        3.20 cm 1.80 cm/m  RA Area:     9.93 cm LA Vol (A2C):   35.7 ml 20.06 ml/m RA Volume:   19.40 ml 10.90 ml/m LA Vol (A4C):   25.0 ml 14.05 ml/m LA Biplane Vol: 32.2 ml 18.09 ml/m  AORTIC VALVE LVOT Vmax:   90.30 cm/s LVOT Vmean:  63.200 cm/s  LVOT VTI:    0.221 m AI PHT:      684 msec  AORTA Ao Root diam: 3.60 cm MITRAL VALVE  TRICUSPID VALVE MV Area (PHT): 3.42 cm    TR Peak grad:   25.0 mmHg MV Decel Time: 222 msec    TR Vmax:        250.00 cm/s MV E velocity: 68.10 cm/s MV A velocity: 90.30 cm/s  SHUNTS MV E/A ratio:  0.75        Systemic VTI:  0.22 m                            Systemic Diam: 2.30 cm Kirk Ruths MD Electronically signed by Kirk Ruths MD Signature Date/Time: 10/11/2020/10:06:12 AM    Final      Labs:   Basic Metabolic Panel: Recent Labs  Lab 10/10/20 1558 10/11/20 0436 10/12/20 0212 10/13/20 1107  NA 137 138 137 138  K 2.6* 2.6* 2.7* 3.5  CL 99 103 107 106  CO2 _0 GLUCOSE 109* 100* 96 101*  BUN 8 8 5* 8  CREATININE 0.54* 0.54* 0.49* 0.50*  CALCIUM 8.7* 7.9* 7.7* 8.0*  MG  --  2.8* 2.3 2.5*   GFR Estimated Creatinine Clearance: 68.7 mL/min (A) (by C-G formula based on SCr of 0.5 mg/dL (L)). Liver Function Tests: Recent Labs  Lab 10/10/20 1558  AST 22  ALT 24  ALKPHOS 56  BILITOT 0.4  PROT 7.1  ALBUMIN 4.3   No results for input(s): LIPASE, AMYLASE in the last 168 hours. Recent Labs  Lab 10/10/20 1558  AMMONIA 12   Coagulation profile Recent Labs  Lab 10/10/20 1558  INR 1.0    CBC: Recent Labs  Lab 10/10/20 1558 10/13/20 1107  WBC 6.6 6.4  NEUTROABS 3.9  --   HGB 14.5 14.0  HCT 42.7 40.5  MCV 92.0 90.2  PLT 247 245   Cardiac Enzymes: No results for input(s): CKTOTAL, CKMB, CKMBINDEX, TROPONINI in the last 168 hours. BNP: Invalid input(s): POCBNP CBG: No results for input(s): GLUCAP in the last 168 hours. D-Dimer No results for input(s): DDIMER in the last 72 hours. Hgb A1c Recent Labs    10/11/20 0436  HGBA1C 5.8*   Lipid Profile Recent Labs    10/11/20 0436  CHOL 177  HDL 33*  LDLCALC 117*  TRIG 134  CHOLHDL 5.4   Thyroid function studies Recent Labs    10/11/20 0436  TSH 3.479   Anemia work up Recent Labs     10/12/20 [place]   Microbiology Recent Results (from the past 240 hour(s))  Resp Panel by RT-PCR (Flu A&B, Covid) Nasopharyngeal Swab     Status: None   Collection Time: 10/10/20  4:00 PM   Specimen: Nasopharyngeal Swab; Nasopharyngeal(NP) swabs in vial transport medium  Result Value Ref Range Status   SARS Coronavirus 2 by RT PCR NEGATIVE NEGATIVE Final    Comment: (NOTE) SARS-CoV-2 target nucleic acids are NOT DETECTED.  The SARS-CoV-2 RNA is generally detectable in upper respiratory specimens during the acute phase of infection. The lowest concentration of SARS-CoV-2 viral copies this assay can detect is 138 copies/mL. A negative result does not preclude SARS-Cov-2 infection and should not be used as the sole basis for treatment or other patient management decisions. A negative result may occur with  improper specimen collection/handling, submission of specimen other than nasopharyngeal swab, presence of viral mutation(s) within the areas targeted by this assay, and inadequate number of viral copies(<138 copies/mL). A negative result must be combined with clinical observations, patient history, and epidemiological  information. The expected result is Negative.  Fact Sheet for Patients:  EntrepreneurPulse.com.au  Fact Sheet for Healthcare Providers:  IncredibleEmployment.be  This test is no t yet approved or cleared by the [place] FDA and  has been authorized for detection and/or diagnosis of SARS-CoV-2 by FDA under an Emergency Use Authorization (EUA). This EUA will remain  in effect (meaning this test can be used) for the duration of the COVID-19 declaration under Section 564(b)(1) of the Act, 21 U.S.C.section 360bbb-3(b)(1), unless the authorization is terminated  or revoked sooner.       Influenza A by PCR NEGATIVE NEGATIVE Final   Influenza B by PCR NEGATIVE NEGATIVE Final    Comment: (NOTE) The Xpert Xpress  SARS-CoV-2/FLU/RSV plus assay is intended as an aid in the diagnosis of influenza from Nasopharyngeal swab specimens and should not be used as a sole basis for treatment. Nasal washings and aspirates are unacceptable for Xpert Xpress SARS-CoV-2/FLU/RSV testing.  Fact Sheet for Patients: EntrepreneurPulse.com.au  Fact Sheet for Healthcare Providers: IncredibleEmployment.be  This test is not yet approved or cleared by the [place] FDA and has been authorized for detection and/or diagnosis of SARS-CoV-2 by FDA under an Emergency Use Authorization (EUA). This EUA will remain in effect (meaning this test can be used) for the duration of the COVID-19 declaration under Section 564(b)(1) of the Act, 21 U.S.C. section 360bbb-3(b)(1), unless the authorization is terminated or revoked.  Performed at [facility], [address], [place], [place] [address]      Discharge Instructions:   Discharge Instructions     Diet - low sodium heart healthy   Complete by: As directed    Discharge instructions   Complete by: As directed    Follow-up with your primary care physician in 1 week.  Take time to change positions especially from lying down to standing.  Increase oral hydration.  Minimize pain medications if that is possible.  Seek medical attention for worsening symptoms.  Continue physical therapy at home.  Check blood work in 1 week.   Increase activity slowly   Complete by: As directed       Allergies as of 10/13/2020       Reactions   Lipitor [atorvastatin]    Muscles aches        Medication List     STOP taking these medications    azithromycin 250 MG tablet Commonly known as: ZITHROMAX   ceFAZolin 10 g injection Commonly known as: ANCEF   cetirizine 10 MG tablet Commonly known as: ZYRTEC   diclofenac sodium 1 % Gel Commonly known as: VOLTAREN   pantoprazole 40 MG tablet Commonly known as: PROTONIX   tamsulosin 0.4 MG Caps  capsule Commonly known as: FLOMAX   traZODone 50 MG tablet Commonly known as: DESYREL       TAKE these medications    abiraterone acetate 250 MG tablet Commonly known as: ZYTIGA TAKE 4 TABLETS (1,000 MG TOTAL) BY MOUTH DAILY. TAKE ON AN EMPTY STOMACH 1 HOUR BEFORE OR 2 HOURS AFTER A MEAL   ALPRAZolam 0.5 MG tablet Commonly known as: XANAX Take 1 tablet (0.5 mg total) by mouth 2 (two) times daily as needed for anxiety.   aspirin EC 81 MG tablet Take 1 tablet (81 mg total) by mouth daily.   dronabinol 2.5 MG capsule Commonly known as: MARINOL Take 1 capsule (2.5 mg total) by mouth 2 (two) times daily before a meal.   DULoxetine 60 MG capsule Commonly known as: CYMBALTA Take  60 mg by mouth daily.   fentaNYL 25 MCG/HR Commonly known as: [place] 1 patch onto the skin every 3 (three) days.   fluticasone 50 MCG/ACT nasal spray Commonly known as: FLONASE Place 2 sprays into both nostrils daily.   gabapentin 400 MG capsule Commonly known as: NEURONTIN TAKE 1 CAPSULE BY MOUTH THREE TIMES DAILY   HYDROmorphone 4 MG tablet Commonly known as: Dilaudid Take 1 tablet (4 mg total) by mouth every 8 (eight) hours as needed for severe pain.   lactulose 10 GM/15ML solution Commonly known as: CHRONULAC Take 30 mLs (20 g total) by mouth every 3 (three) hours as needed for mild constipation (take 42m po every 3 hrs until BM).   linaclotide 145 MCG Caps capsule Commonly known as: LINZESS Take 145 mcg by mouth daily before breakfast.   lisinopril 10 MG tablet Commonly known as: ZESTRIL Take 1 tablet by mouth once daily   Narcan 4 MG/0.1ML Liqd nasal spray kit Generic drug: naloxone Place 1 spray into the nose once.   nitroGLYCERIN 0.4 MG SL tablet Commonly known as: NITROSTAT Place 1 tablet (0.4 mg total) under the tongue every 5 (five) minutes as needed for chest pain. X 3 doses   Oscal 500/200 D-3 500-200 MG-UNIT tablet Generic drug: calcium-vitamin D Take 1  tablet by mouth 2 (two) times daily.   polyethylene glycol powder 17 GM/SCOOP powder Commonly known as: GLYCOLAX/MIRALAX Take 17 g by mouth daily as needed for mild constipation or moderate constipation.   potassium chloride SA 20 MEQ tablet Commonly known as: KLOR-CON Take 1 tablet (20 mEq total) by mouth daily.   predniSONE 5 MG tablet Commonly known as: DELTASONE TAKE 1 TABLET (5 MG TOTAL) BY MOUTH DAILY WITH BREAKFAST.   prochlorperazine 10 MG tablet Commonly known as: COMPAZINE Take 1 tablet (10 mg total) by mouth every 6 (six) hours as needed for nausea or vomiting. What changed: how much to take        Follow-up Information     Dettinger, JFransisca Kaufmann MD Follow up in 1 week(s).   Specialties: Family Medicine, Cardiology Why: blood work and regular followup Contact information: [phone]         MSatira Sark MD .   Specialty: Cardiology Contact information: [place] [phone]                 Time coordinating discharge: 39 minutes  Signed:  Annlouise Gerety  [facility] 10/13/2020, 1:32 PM

## 2020-10-13 NOTE — Progress Notes (Signed)
Initial Nutrition Assessment  DOCUMENTATION CODES:   Not applicable  INTERVENTION:  -Ensure Enlive po BID, each supplement provides 350 kcal and 20 grams of protein -MVI with minerals daily  NUTRITION DIAGNOSIS:   Inadequate oral intake related to decreased appetite as evidenced by per patient/family report.  GOAL:   Patient will meet greater than or equal to 90% of their needs  MONITOR:   PO intake, Supplement acceptance, Weight trends, Labs, I & O's  REASON FOR ASSESSMENT:   Malnutrition Screening Tool    ASSESSMENT:   Pt with PMH significant for HTN, CAD, and prostate cancer w/ metastatic disease to the bone presented s/p fall with shortness of breath and disequilibrium for 2-3 weeks  Pt unavailable at time of RD visit.  No PO intake documented. Per RN, pt denies N/V/abdominal pain.   Medications: marinol, linzess, klor-con, deltasone, drisdol Labs: Mg 2.5 (H)  UOP: 1.2L x24 hours I/O: -1.9L since admit  Diet Order:   Diet Order             Diet - low sodium heart healthy           Diet Heart Room service appropriate? Yes; Fluid consistency: Thin  Diet effective now                   EDUCATION NEEDS:   No education needs have been identified at this time  Skin:  Skin Assessment: Reviewed RN Assessment  Last BM:  8/14  Height:   Ht Readings from Last 1 Encounters:  10/10/20 5\' 6"  (1.676 m)    Weight:   Wt Readings from Last 1 Encounters:  10/10/20 68.9 kg    BMI:  Body mass index is 24.53 kg/m.  Estimated Nutritional Needs:   Kcal:  1850-2050  Protein:  90-105 grams  Fluid:  >1.85L/d    Larkin Ina, MS, RD, LDN (she/her/hers) RD pager number and weekend/on-call pager number located in [place].

## 2020-10-13 NOTE — Care Management Important Message (Signed)
Important Message  Patient Details  Name: Keith Olson. MRN: [id number] Date of Birth: 08/19/1940   Medicare Important Message Given:  Yes     [name] [address] 10/13/2020, 4:26 PM

## 2020-10-14 NOTE — TOC Progression Note (Signed)
Transition of Care (TOC) - Progression Note    Patient Details  Name: Keith Olson. MRN: [id number] Date of Birth: 1940-07-19  Transition of Care [facility]) CM/SW Contact  Angelita Ingles, RN Phone Number:[phone]  10/14/2020, 3:55 PM  Clinical Narrative:    CM did not set up [facility] prior to patient being discharged. CM looked up patient to go to bedside but patient has been discharged. CM attempted to call patient at to discuss [facility] set up no answer. Message has been left. CM will attempt to contact patient again.         Expected Discharge Plan and Services           Expected Discharge Date: 10/13/20                                     Social Determinants of Health (SDOH) Interventions    Readmission Risk Interventions Readmission Risk Prevention Plan 04/06/2020 07/23/2019  Transportation Screening Complete Complete  PCP or Specialist Appt within 3-5 Days - Not Complete  HRI or [place] Complete Complete  Social Work Consult for [place] Planning/Counseling Complete Complete  Palliative Care Screening Not Applicable Not Applicable  Medication Review [employer]) Complete Complete  Some recent data might be hidden

## 2020-10-14 NOTE — Care Management Important Message (Signed)
Important Message  Patient Details  Name: Keith Olson. MRN: [id number] Date of Birth: [birth year]   Medicare Important Message Given:  Yes  Patient resting copy of IM left a patient bedside   Orbie Pyo 10/14/2020, 9:56 AM

## 2020-10-15 ENCOUNTER — Telehealth: Payer: Self-pay

## 2020-10-15 NOTE — Telephone Encounter (Signed)
Transition Care Management Unsuccessful Follow-up Telephone Call  Date of discharge and from where:  [facility] 10/13/20  Diagnosis:  fall   Attempts:  1st Attempt  Reason for unsuccessful TCM follow-up call:  Left voice message

## 2020-10-16 ENCOUNTER — Encounter (HOSPITAL_COMMUNITY): Payer: Medicare Other | Admitting: Dietician

## 2020-10-16 ENCOUNTER — Telehealth: Payer: Self-pay | Admitting: Dietician

## 2020-10-16 NOTE — Telephone Encounter (Signed)
Nutrition Follow-up:  Patient with prostate cancer metastatic to bone. He is receiving Lupron and Zytiga. Noted recent [facility] admission 8/12-8/15 due to falls with disequilibrium and hypokalemia.   Spoke with patient via telephone. He reports feeling some better since hospital discharge and appreciative of call this afternoon. He reports his appetite "is nothing" but he makes himself eat. Patient reports recently eating lunch meal, recalls potato salad, green beans, pintos. He ate a "little bit" of oatmeal for breakfast. Patient is drinking 3 Ensure Plus daily "when they are there" He is requesting another sample case.    Medications: Klor-con  Labs: 8/15 - Glucose 101, K 3.5 up from 2.6 on 8/14  Anthropometrics: Last weight 152 lb on 8/12 decreased 10 lbs (6.2%) in the last 7 weeks which is insignificant for time frame.   6/28 - 162 lb 3.2 oz 5/19 - 167 lb 8 oz 4/21 - 162 lb 14.4 oz   NUTRITION DIAGNOSIS: Inadequate oral intake ongoing   INTERVENTION:  Encouraged eating smaller more frequent meals and snacks high in calories and protein Reviewed foods with protein Suggested bedtime snack Continue drinking 3 Ensure Plus daily Will leave complimentary Ensure Plus case at registration desk for pick up after 8/23 MD appointment, pt aware     MONITORING, EVALUATION, GOAL: weight trends, intake   NEXT VISIT: via telephone ~3-4 weeks

## 2020-10-16 NOTE — Telephone Encounter (Signed)
Transition Care Management Unsuccessful Follow-up Telephone Call  Date of discharge and from where:  [facility] 10/13/20  Diagnosis: fall   Attempts:  2nd Attempt  Reason for unsuccessful TCM follow-up call:  Left voice message

## 2020-10-21 ENCOUNTER — Other Ambulatory Visit (HOSPITAL_COMMUNITY): Payer: Self-pay | Admitting: Hematology

## 2020-10-21 ENCOUNTER — Inpatient Hospital Stay (HOSPITAL_COMMUNITY): Payer: Medicare Other

## 2020-10-21 ENCOUNTER — Ambulatory Visit (HOSPITAL_COMMUNITY): Payer: Medicare Other

## 2020-10-21 ENCOUNTER — Inpatient Hospital Stay (HOSPITAL_COMMUNITY): Payer: Medicare Other | Admitting: Hematology

## 2020-10-21 DIAGNOSIS — C61 Malignant neoplasm of prostate: Secondary | ICD-10-CM

## 2020-10-21 NOTE — Progress Notes (Signed)
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    CANCER STAGING: Cancer Staging No matching staging information was found for the patient.  INTERVAL HISTORY:  [name], a 80 y.o. male, returns for routine follow-up of his metastatic castration sensitive prostate cancer. Williams was last seen on 07/10/20.   Today he reports feeling fair. He reports a fall having occurred with week of 08/01. He presented to the ED on 08/12 due to SOB which is still present. He reports that he is taking 2-3 dilaudid tablets daily. He reports pain in his right hip which has inhibited his walking. He reports hot flashes for which he has been taking black cohosh. He does not monitor BP at home.   REVIEW OF SYSTEMS:  Review of Systems  Constitutional:  Positive for appetite change (25%) and fatigue (25%).  Respiratory:  Positive for shortness of breath.   Genitourinary:  Positive for frequency.   Musculoskeletal:  Positive for arthralgias (8/10 R hip).  Neurological:  Positive for headaches.  Psychiatric/Behavioral:  Positive for sleep disturbance (falling and staying asleep).   All other systems reviewed and are negative.  PAST MEDICAL/SURGICAL HISTORY:  Past Medical History:  Diagnosis Date   Agent orange exposure    Arthritis    Coronary artery disease    BMS to mid LAD 2001, DES to proximal LAD 2017   Enlarged prostate    XRT 2016   Headache    Sinus headaches    Heart abnormality    History of depression    Hyperlipidemia     Hypothyroidism    Prostate cancer ([facility]) 07/2014   hormonal therapy, external beam radiation therapy   PTSD (post-traumatic stress disorder)    Past Surgical History:  Procedure Laterality Date   BRONCHIAL BRUSHINGS  07/02/2019   Procedure: BRONCHIAL BRUSHINGS;  Surgeon: Rigoberto Noel, MD;  Location: [facility];  Service: Cardiopulmonary;;   BRONCHIAL NEEDLE ASPIRATION BIOPSY  07/02/2019   Procedure: BRONCHIAL NEEDLE ASPIRATION BIOPSIES;  Surgeon: Rigoberto Noel, MD;  Location: [facility];  Service: Cardiopulmonary;;   BRONCHIAL WASHINGS  07/02/2019   Procedure: BRONCHIAL WASHINGS;  Surgeon: Rigoberto Noel, MD;  Location: [facility];  Service: Cardiopulmonary;;   CARDIAC CATHETERIZATION N/A 12/12/2015   Procedure: Left Heart Cath and Coronary Angiography;  Surgeon: Peter M Martinique, MD;  Location: [facility];  Service: Cardiovascular;  Laterality: N/A;   CARDIAC CATHETERIZATION N/A 12/12/2015   Procedure: Coronary Stent Intervention;  Surgeon: Peter M Martinique, MD;  Location: [facility];  Service: Cardiovascular;  Laterality: N/A;   CORONARY STENT INTERVENTION N/A 09/06/2017   Procedure: CORONARY STENT INTERVENTION;  Surgeon: Burnell Blanks, MD;  Location: [facility];  Service: Cardiovascular;  Laterality: N/A;   ENDOBRONCHIAL ULTRASOUND N/A 07/02/2019   Procedure: ENDOBRONCHIAL ULTRASOUND;  Surgeon: Rigoberto Noel, MD;  [facility];  Service: Cardiopulmonary;  Laterality: N/A;   FLEXIBLE BRONCHOSCOPY  07/02/2019   Procedure: FLEXIBLE BRONCHOSCOPY;  Surgeon: Rigoberto Noel, MD;  [facility];  Service: Cardiopulmonary;;  HERNIA REPAIR Right    LEFT HEART CATH AND CORONARY ANGIOGRAPHY N/A 09/02/2016   Procedure: Left Heart Cath and Coronary Angiography;  Surgeon: Leonie Man, MD;  Location: [facility];  Service: Cardiovascular;  Laterality: N/A;   LEFT HEART CATH AND CORONARY ANGIOGRAPHY N/A 09/06/2017   Procedure: LEFT HEART CATH AND  CORONARY ANGIOGRAPHY;  Surgeon: Burnell Blanks, MD;  Location: [facility];  Service: Cardiovascular;  Laterality: N/A;   LEFT HEART CATH AND CORONARY ANGIOGRAPHY N/A 05/04/2019   Procedure: LEFT HEART CATH AND CORONARY ANGIOGRAPHY;  Surgeon: Martinique, Peter M, MD;  Location: [facility];  Service: Cardiovascular;  Laterality: N/A;   PROSTATE BIOPSY N/A 08/28/2014   Procedure: BIOPSY TRANSRECTAL ULTRASONIC PROSTATE (TUBP);  Surgeon: Rana Snare, MD;  [facility];  Service: Urology;  Laterality: N/A;   TEE WITHOUT CARDIOVERSION N/A 04/08/2020   Procedure: TRANSESOPHAGEAL ECHOCARDIOGRAM (TEE) WITH PROPOFOL;  Surgeon: Arnoldo Lenis, MD;  [facility];  Service: Endoscopy;  Laterality: N/A;   TRANSURETHRAL RESECTION OF PROSTATE N/A 08/28/2014   Procedure: TRANSURETHRAL RESECTION OF THE PROSTATE WITH GYRUS INSTRUMENTS;  Surgeon: Rana Snare, MD;  Location: [facility];  Service: Urology;  Laterality: N/A;    SOCIAL HISTORY:  Social History   Socioeconomic History   Marital status: Married    Spouse name: Not on file   Number of children: 5   Years of education: Not on file   Highest education level: Not on file  Occupational History   Occupation: retired  Tobacco Use   Smoking status: Never   Smokeless tobacco: Former    Types: Nurse, children's Use: Never used  Substance and Sexual Activity   Alcohol use: No   Drug use: No   Sexual activity: Yes  Other Topics Concern   Not on file  Social History Narrative   Married   5 children   Social Determinants of Health   Financial Resource Strain: Not on file  Food Insecurity: Not on file  Transportation Needs: Not on file  Physical Activity: Not on file  Stress: Not on file  Social Connections: Not on file  Intimate Partner Violence: Not on file    FAMILY HISTORY:  Family History  Problem Relation Age of Onset   Arthritis Mother    Heart attack Father     CURRENT MEDICATIONS:   Current Outpatient Medications  Medication Sig Dispense Refill   abiraterone acetate (ZYTIGA) 250 MG tablet TAKE 4 TABLETS (1,000 MG TOTAL) BY MOUTH DAILY. TAKE ON AN EMPTY STOMACH 1 HOUR BEFORE OR 2 HOURS AFTER A MEAL 120 tablet 6   ALPRAZolam (XANAX) 0.5 MG tablet Take 1 tablet (0.5 mg total) by mouth 2 (two) times daily as needed for anxiety. 10 tablet 0   aspirin EC 81 MG tablet Take 1 tablet (81 mg total) by mouth daily.     calcium-vitamin D (OSCAL 500/200 D-3) 500-200 MG-UNIT tablet Take 1 tablet by mouth 2 (two) times daily. 60 tablet 2   dronabinol (MARINOL) 2.5 MG capsule Take 1 capsule (2.5 mg total) by mouth 2 (two) times daily before a meal. 60 capsule 2   DULoxetine (CYMBALTA) 60 MG capsule Take 60 mg by mouth daily.     fentaNYL (DURAGESIC) 25 MCG/HR Place 1 patch onto the skin every 3 (three) days. 10 patch 0   fluticasone (FLONASE) 50 MCG/ACT nasal spray Place 2 sprays into both nostrils daily.  gabapentin (NEURONTIN) 400 MG capsule TAKE 1 CAPSULE BY MOUTH THREE TIMES DAILY 90 capsule 6   HYDROmorphone (DILAUDID) 4 MG tablet Take 1 tablet (4 mg total) by mouth every 8 (eight) hours as needed for severe pain. 90 tablet 0   lactulose (CHRONULAC) 10 GM/15ML solution Take 30 mLs (20 g total) by mouth every 3 (three) hours as needed for mild constipation (take 72m po every 3 hrs until BM). 480 mL 0   linaclotide (LINZESS) 145 MCG CAPS capsule Take 145 mcg by mouth daily before breakfast.     lisinopril (ZESTRIL) 10 MG tablet Take 1 tablet by mouth once daily 90 tablet 0   NARCAN 4 MG/0.1ML LIQD nasal spray kit Place 1 spray into the nose once.     nitroGLYCERIN (NITROSTAT) 0.4 MG SL tablet Place 1 tablet (0.4 mg total) under the tongue every 5 (five) minutes as needed for chest pain. X 3 doses 25 tablet 12   polyethylene glycol powder (GLYCOLAX/MIRALAX) 17 GM/SCOOP powder Take 17 g by mouth daily as needed for mild constipation or moderate constipation.      potassium chloride SA  (KLOR-CON) 20 MEQ tablet Take 1 tablet (20 mEq total) by mouth daily. 10 tablet 0   predniSONE (DELTASONE) 5 MG tablet TAKE 1 TABLET (5 MG TOTAL) BY MOUTH DAILY WITH BREAKFAST. 30 tablet 6   prochlorperazine (COMPAZINE) 10 MG tablet Take 1 tablet (10 mg total) by mouth every 6 (six) hours as needed for nausea or vomiting. 30 tablet 3   No current facility-administered medications for this visit.    ALLERGIES:  Allergies  Allergen Reactions   Lipitor [Atorvastatin]     Muscles aches    PHYSICAL EXAM:  Performance status (ECOG): 1 - Symptomatic but completely ambulatory  There were no vitals filed for this visit. Wt Readings from Last 3 Encounters:  10/10/20 152 lb (68.9 kg)  08/26/20 162 lb 3.2 oz (73.6 kg)  07/10/20 167 lb 8 oz (76 kg)   Physical Exam Vitals reviewed.  Constitutional:      Appearance: Normal appearance.     Comments: In wheelchair  Cardiovascular:     Rate and Rhythm: Normal rate and regular rhythm.     Pulses: Normal pulses.     Heart sounds: Normal heart sounds.  Pulmonary:     Effort: Pulmonary effort is normal.     Breath sounds: Normal breath sounds.  Neurological:     General: No focal deficit present.     Mental Status: He is alert and oriented to person, place, and time.  Psychiatric:        Mood and Affect: Mood normal.        Behavior: Behavior normal.     LABORATORY DATA:  I have reviewed the labs as listed.  CBC Latest Ref Rng & Units 10/13/2020 10/10/2020 09/23/2020  WBC 4.0 - 10.5 K/uL 6.4 6.6 8.2  Hemoglobin 13.0 - 17.0 g/dL 14.0 14.5 13.5  Hematocrit 39.0 - 52.0 % 40.5 42.7 40.2  Platelets 150 - 400 K/uL 245 247 213   CMP Latest Ref Rng & Units 10/13/2020 10/12/2020 10/11/2020  Glucose 70 - 99 mg/dL 101(H) 96 100(H)  BUN 8 - 23 mg/dL 8 5(L) 8  Creatinine 0.61 - 1.24 mg/dL 0.50(L) 0.49(L) 0.54(L)  Sodium 135 - 145 mmol/L 138 137 138  Potassium 3.5 - 5.1 mmol/L 3.5 2.7(LL) 2.6(LL)  Chloride 98 - 111 mmol/L 106 107 103  CO2 22 - 32  mmol/L 23 22 28  Calcium 8.9 - 10.3 mg/dL 8.0(L) 7.7(L) 7.9(L)  Total Protein 6.5 - 8.1 g/dL - - -  Total Bilirubin 0.3 - 1.2 mg/dL - - -  Alkaline Phos 38 - 126 U/L - - -  AST 15 - 41 U/L - - -  ALT 0 - 44 U/L - - -    DIAGNOSTIC IMAGING:  I have independently reviewed the scans and discussed with the patient. CT HEAD WO CONTRAST  Result Date: 10/10/2020 CLINICAL DATA:  Dizziness, history of cancer EXAM: CT HEAD WITHOUT CONTRAST TECHNIQUE: Contiguous axial images were obtained from the base of the skull through the vertex without intravenous contrast. COMPARISON:  04/04/2020 FINDINGS: Brain: No evidence of acute infarction, hemorrhage, hydrocephalus, extra-axial collection or mass lesion/mass effect. Periventricular white matter changes, likely the sequela of chronic small vessel ischemic disease. Vascular: No hyperdense vessel or unexpected calcification. Skull: Normal. Negative for fracture or focal lesion. Sinuses/Orbits: Mucosal thickening in the left maxillary sinus and bilateral ethmoid air cells. The orbits are unremarkable. Other: None. IMPRESSION: No acute intracranial process. Electronically Signed   By: Merilyn Baba M.D.   On: 10/10/2020 16:41   MR ANGIO NECK W WO CONTRAST  Result Date: 10/11/2020 CLINICAL DATA:  Initial evaluation for neuro deficit, stroke suspected. History of prostate cancer. EXAM: MRA NECK WITHOUT AND WITH CONTRAST TECHNIQUE: Multiplanar and multiecho pulse sequences of the neck were obtained without and with intravenous contrast. Angiographic images of the neck were obtained using MRA technique without and with intravenous contrast. CONTRAST:  6.56m GADAVIST GADOBUTROL 1 MMOL/ML IV SOLN COMPARISON:  None available. FINDINGS: AORTIC ARCH: Visualized aortic arch normal caliber with normal branch pattern. No hemodynamically significant stenosis seen about the origin of the great vessels. RIGHT CAROTID SYSTEM: Right CCA patent from its origin to the bifurcation without  stenosis. Atheromatous irregularity about the right carotid bulb with associated mild 35% stenosis at the origin of the cervical right ICA (series [id number], image 19). Right ICA patent distally without stenosis, evidence for dissection or occlusion. LEFT CAROTID SYSTEM: Left CCA patent from its origin to the bifurcation without stenosis. No significant atheromatous narrowing about the left carotid bulb. Left ICA patent distally without stenosis, dissection or occlusion. VERTEBRAL ARTERIES: Both vertebral arteries arise from the subclavian arteries. Innominate and proximal right subclavian arteries are widely patent. Multifocal atheromatous irregularity throughout the proximal left subclavian artery without high-grade stenosis. Atheromatous irregularity about the origins of both vertebral arteries, with associated moderate approximate 50% stenosis on the left and no more than mild narrowing on the right. Otherwise, vertebral arteries patent within the neck without stenosis, evidence for dissection or occlusion. Atheromatous irregularity noted about the right V3 segment without stenosis. Other: None. IMPRESSION: 1. Atheromatous irregularity about the right carotid bulb with associated mild 35% stenosis at the origin of the cervical right ICA. 2. Otherwise wide patency of both carotid artery systems within the neck. 3. Atheromatous change about the origins of both vertebral arteries, with associated moderate approximate 50% stenosis on the left and no more than mild narrowing on the right. Electronically Signed   By: BJeannine BogaM.D.   On: 10/11/2020 22:24   MR BRAIN WO CONTRAST  Result Date: 10/11/2020 CLINICAL DATA:  Initial evaluation for neuro deficit, stroke suspected. EXAM: MRI HEAD WITHOUT CONTRAST TECHNIQUE: Multiplanar, multiecho pulse sequences of the brain and surrounding structures were obtained without intravenous contrast. COMPARISON:  CT from 10/10/2020. FINDINGS: Brain: Cerebral volume within  normal limits for age. Patchy T2/FLAIR hyperintensity within the periventricular  deep white matter both cerebral hemispheres most consistent with chronic small vessel ischemic disease, mild to moderate in nature. Apparent focus of FLAIR signal abnormality at the left cerebellopontine angle is not seen on corresponding sequences, favored to be artifactual in nature (series 6, image 10). No abnormal foci of restricted diffusion to suggest acute or subacute ischemia. Gray-white matter differentiation maintained. No encephalomalacia to suggest chronic cortical infarction. No evidence for acute or chronic intracranial hemorrhage. No mass lesion, midline shift or mass effect. Mild ventricular prominence related to global parenchymal volume loss of hydrocephalus. No extra-axial fluid collection. Pituitary gland suprasellar region normal. Midline structures intact and normal. Vascular: Major intracranial vascular flow voids are well maintained. Skull and upper cervical spine: Craniocervical junction normal. Visualized bone marrow signal intensity within normal limits. No visible focal marrow replacing lesion. No scalp soft tissue abnormality. Sinuses/Orbits: Globes and orbital soft tissues within normal limits. Scattered mucosal thickening noted throughout the paranasal sinuses. No significant mastoid effusion. Inner ear structures grossly normal. Other: None. IMPRESSION: 1. No acute intracranial abnormality. 2. Mild to moderate chronic microvascular ischemic disease. Electronically Signed   By: Jeannine Boga M.D.   On: 10/11/2020 22:17   DG Chest Portable 1 View  Result Date: 10/10/2020 CLINICAL DATA:  Shortness of breath EXAM: PORTABLE CHEST 1 VIEW COMPARISON:  04/04/2020 FINDINGS: Cardiac shadow is mildly prominent but stable. Aortic calcifications are seen. The lungs are well aerated bilaterally. No focal infiltrate or effusion is noted. No bony abnormality is noted. IMPRESSION: No active disease.  Electronically Signed   By: Inez Catalina M.D.   On: 10/10/2020 17:21   ECHOCARDIOGRAM COMPLETE  Result Date: 10/11/2020    ECHOCARDIOGRAM REPORT   Patient Name:   Keith Olson. Date of Exam: 10/11/2020 Medical Rec #:  [id number]        Height:       66.0 in Accession #:    [id number]       Weight:       152.0 lb Date of Birth:  1940/03/15        BSA:          1.780 m Patient Age:    43 years         BP:           200/83 mmHg Patient Gender: M                HR:           58 bpm. Exam Location:  [facility] Procedure: 2D Echo, Cardiac Doppler and Color Doppler Indications:    CVA  History:        Patient has prior history of Echocardiogram examinations, most                 recent 04/08/2020. CAD, Signs/Symptoms:Shortness of Breath and                 Dizziness, fall, bone cancer; Risk Factors:Hypertension.  Sonographer:    Dustin Flock RDCS Referring Phys: [place]  1. Left ventricular ejection fraction, by estimation, is 55 to 60%. The left ventricle has normal function. The left ventricle has no regional wall motion abnormalities. There is mild left ventricular hypertrophy. Left ventricular diastolic parameters are consistent with Grade I diastolic dysfunction (impaired relaxation).  2. Right ventricular systolic function is normal. The right ventricular size is normal. Tricuspid regurgitation signal is inadequate for assessing PA pressure.  3. The mitral valve is normal in structure.  Trivial mitral valve regurgitation. No evidence of mitral stenosis.  4. The aortic valve is tricuspid. Aortic valve regurgitation is mild. No aortic stenosis is present.  5. The inferior vena cava is normal in size with greater than 50% respiratory variability, suggesting right atrial pressure of 3 mmHg. FINDINGS  Left Ventricle: Left ventricular ejection fraction, by estimation, is 55 to 60%. The left ventricle has normal function. The left ventricle has no regional wall motion abnormalities. The left  ventricular internal cavity size was normal in size. There is  mild left ventricular hypertrophy. Left ventricular diastolic parameters are consistent with Grade I diastolic dysfunction (impaired relaxation). Right Ventricle: The right ventricular size is normal. Right ventricular systolic function is normal. Tricuspid regurgitation signal is inadequate for assessing PA pressure. The tricuspid regurgitant velocity is 2.50 m/s, and with an assumed right atrial  pressure of 3 mmHg, the estimated right ventricular systolic pressure is 16.0 mmHg. Left Atrium: Left atrial size was normal in size. Right Atrium: Right atrial size was normal in size. Pericardium: There is no evidence of pericardial effusion. Mitral Valve: The mitral valve is normal in structure. Trivial mitral valve regurgitation. No evidence of mitral valve stenosis. Tricuspid Valve: The tricuspid valve is normal in structure. Tricuspid valve regurgitation is trivial. No evidence of tricuspid stenosis. Aortic Valve: The aortic valve is tricuspid. Aortic valve regurgitation is mild. Aortic regurgitation PHT measures 684 msec. No aortic stenosis is present. Pulmonic Valve: The pulmonic valve was normal in structure. Pulmonic valve regurgitation is trivial. No evidence of pulmonic stenosis. Aorta: The aortic root is normal in size and structure. Venous: The inferior vena cava is normal in size with greater than 50% respiratory variability, suggesting right atrial pressure of 3 mmHg. IAS/Shunts: No atrial level shunt detected by color flow Doppler.  LEFT VENTRICLE PLAX 2D LVIDd:         4.89 cm  Diastology LVIDs:         3.02 cm  LV e' medial:    6.13 cm/s LV PW:         1.22 cm  LV E/e' medial:  11.1 LV IVS:        1.36 cm  LV e' lateral:   5.96 cm/s LVOT diam:     2.30 cm  LV E/e' lateral: 11.4 LV SV:         92 LV SV Index:   52 LVOT Area:     4.15 cm  RIGHT VENTRICLE RV Basal diam:  2.63 cm RV S prime:     17.90 cm/s TAPSE (M-mode): 1.6 cm LEFT ATRIUM              Index       RIGHT ATRIUM          Index LA diam:        3.20 cm 1.80 cm/m  RA Area:     9.93 cm LA Vol (A2C):   35.7 ml 20.06 ml/m RA Volume:   19.40 ml 10.90 ml/m LA Vol (A4C):   25.0 ml 14.05 ml/m LA Biplane Vol: 32.2 ml 18.09 ml/m  AORTIC VALVE LVOT Vmax:   90.30 cm/s LVOT Vmean:  63.200 cm/s LVOT VTI:    0.221 m AI PHT:      684 msec  AORTA Ao Root diam: 3.60 cm MITRAL VALVE               TRICUSPID VALVE MV Area (PHT): 3.42 cm    TR Peak grad:   25.0  mmHg MV Decel Time: 222 msec    TR Vmax:        250.00 cm/s MV E velocity: 68.10 cm/s MV A velocity: 90.30 cm/s  SHUNTS MV E/A ratio:  0.75        Systemic VTI:  0.22 m                            Systemic Diam: 2.30 cm Kirk Ruths MD Electronically signed by Kirk Ruths MD Signature Date/Time: 10/11/2020/10:06:12 AM    Final      ASSESSMENT:  1.  Metastatic castration sensitive prostate cancer: -History of prostate cancer diagnosed 08/28/2014, Gleason 4+4= 8, status post XRT and seed implants. -Presentation with low back pain of several months, PSA on 06/19/2019 was 239, testosterone 210, alkaline phosphatase 306. -MRI of the cervical, thoracic and lumbar spine on 07/27/2019 shows diffuse bone metastatic disease.  Mild to moderate compression deformity of T3 with mild ventral epidural extension.  No cord compression.  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 -CT angio of the chest, abdomen and pelvis on 06/27/2019 shows multiple lung nodules bilaterally, largest in the left upper lobe measuring 2.5 x 1.6 x 1.5 cm with associated left hilar and AP window lymphadenopathy. -Navigational bronchoscopy and biopsy on 07/02/2019 with upper lobe brushing and washing shows atypical cells.  10 L FNA also shows atypical cells. -1 pack/day for 4-5 years, quit 60 years ago.  Positive agent orange exposure -Lupron 45 mg on 07/12/2019 along with 1 month of  Casodex. -PET scan on 08/11/2019 shows mildly hypermetabolic widespread sclerotic osseous metastasis throughout the axial and proximal appendicular skeleton, increased sclerosis since 06/27/2018 on CT angiogram.  This could represent response to therapy.  No hypermetabolic lymphadenopathy.  Left mediastinal and left hilar adenopathy is seen on 06/19/2019 has resolved.  Bilateral pulmonary nodules are stable to decreased in size.  Dominant left upper lobe lung nodule has decreased from 2.5 cm to 1.7 cm. -Abiraterone was started around 08/29/2019. -PET scan was done on 03/03/2020 as he complained of joint pains.  New mildly hypermetabolic mildly enlarged right subcarinal lymph node measuring 1 cm, SUV 4.1.  No other lymphadenopathy.  Widespread sclerotic lesions throughout the axial and proximal appendicular skeleton, appearing stable in distribution and increase in sclerosis.   2.  Low back pain: -10 treatments of radiation therapy completed on 07/11/2019.   PLAN:  1.  Metastatic castration sensitive prostate cancer: - Recent hospitalization after a fall as his foot got stuck. - Reviewed recent hospitalization records.  Reviewed MRI of the brain which did not show any acute abnormality. - Reviewed labs from 10/22/2020 which showed normal LFTs and CBC.  PSA 0.08 and stable. - Continue Abiraterone 1000 mg daily along with prednisone 5 mg daily. - He is missing prednisone doses.  I have stressed the importance of taking prednisone 5 mg daily to minimize side effects. - His potassium today is 2.9.  Will give potassium 40 mEq p.o. - We will start him on potassium 20 mEq daily. - RTC 8 weeks for follow-up.   2.  Right hip pain: - Continue fentanyl patch 25 mcg daily. - We will cut back on Dilaudid to 4 mg tablet 3 times daily #90 to decrease risk of falls.   3.  Loss of appetite: - Continue Marinol 2.5 mg twice daily. - Continue Ensure 3 cans/day.  4.  Constipation: - Continue stool softener and  MiraLAX.   5.  Bone mets: - Continue denosumab monthly.  Calcium today is 8.7. - Continue calcium and vitamin D supplements.   6.  Hypertension: - Continue lisinopril 10 mg daily.   7.  Hot flashes: - Continue black cohosh as needed.   8.  High risk drug monitoring: - Blood pressure today is elevated at 180/80.  However he is stressed and anxious. - Continue lisinopril 10 mg daily.  Recommend checking blood pressure at home and bring the log to [place].   Orders placed this encounter:  No orders of the defined types were placed in this encounter.    Derek Jack, MD [place] [phone]   I, Thana Ates, am acting as a scribe for Dr. Derek Jack.  I, Derek Jack MD, have reviewed the above documentation for accuracy and completeness, and I agree with the above.

## 2020-10-22 ENCOUNTER — Inpatient Hospital Stay (HOSPITAL_COMMUNITY): Payer: Medicare Other

## 2020-10-22 ENCOUNTER — Inpatient Hospital Stay (HOSPITAL_COMMUNITY): Payer: Medicare Other | Attending: Hematology | Admitting: Hematology

## 2020-10-22 ENCOUNTER — Other Ambulatory Visit (HOSPITAL_COMMUNITY): Payer: Self-pay | Admitting: *Deleted

## 2020-10-22 ENCOUNTER — Other Ambulatory Visit: Payer: Self-pay

## 2020-10-22 VITALS — BP 183/84 | HR 63 | Temp 97.5°F | Resp 18 | Wt 156.6 lb

## 2020-10-22 DIAGNOSIS — R232 Flushing: Secondary | ICD-10-CM | POA: Insufficient documentation

## 2020-10-22 DIAGNOSIS — C61 Malignant neoplasm of prostate: Secondary | ICD-10-CM | POA: Diagnosis not present

## 2020-10-22 DIAGNOSIS — E039 Hypothyroidism, unspecified: Secondary | ICD-10-CM | POA: Insufficient documentation

## 2020-10-22 DIAGNOSIS — C7951 Secondary malignant neoplasm of bone: Secondary | ICD-10-CM

## 2020-10-22 DIAGNOSIS — K5900 Constipation, unspecified: Secondary | ICD-10-CM | POA: Diagnosis not present

## 2020-10-22 DIAGNOSIS — I10 Essential (primary) hypertension: Secondary | ICD-10-CM | POA: Insufficient documentation

## 2020-10-22 DIAGNOSIS — R630 Anorexia: Secondary | ICD-10-CM | POA: Diagnosis not present

## 2020-10-22 LAB — CBC WITH DIFFERENTIAL/PLATELET
Abs Immature Granulocytes: 0.01 10*3/uL (ref 0.00–0.07)
Basophils Absolute: 0.1 10*3/uL (ref 0.0–0.1)
Basophils Relative: 1 %
Eosinophils Absolute: 0.3 10*3/uL (ref 0.0–0.5)
Eosinophils Relative: 5 %
HCT: 41.3 % (ref 39.0–52.0)
Hemoglobin: 14.3 g/dL (ref 13.0–17.0)
Immature Granulocytes: 0 %
Lymphocytes Relative: 21 %
Lymphs Abs: 1.4 10*3/uL (ref 0.7–4.0)
MCH: 31.9 pg (ref 26.0–34.0)
MCHC: 34.6 g/dL (ref 30.0–36.0)
MCV: 92.2 fL (ref 80.0–100.0)
Monocytes Absolute: 0.8 10*3/uL (ref 0.1–1.0)
Monocytes Relative: 12 %
Neutro Abs: 4.1 10*3/uL (ref 1.7–7.7)
Neutrophils Relative %: 61 %
Platelets: 292 10*3/uL (ref 150–400)
RBC: 4.48 MIL/uL (ref 4.22–5.81)
RDW: 12.8 % (ref 11.5–15.5)
WBC: 6.6 10*3/uL (ref 4.0–10.5)
nRBC: 0 % (ref 0.0–0.2)

## 2020-10-22 LAB — COMPREHENSIVE METABOLIC PANEL
ALT: 20 U/L (ref 0–44)
AST: 19 U/L (ref 15–41)
Albumin: 4.1 g/dL (ref 3.5–5.0)
Alkaline Phosphatase: 60 U/L (ref 38–126)
Anion gap: 9 (ref 5–15)
BUN: 12 mg/dL (ref 8–23)
CO2: 26 mmol/L (ref 22–32)
Calcium: 8.7 mg/dL — ABNORMAL LOW (ref 8.9–10.3)
Chloride: 101 mmol/L (ref 98–111)
Creatinine, Ser: 0.55 mg/dL — ABNORMAL LOW (ref 0.61–1.24)
GFR, Estimated: >60 mL/min (ref >60)
Glucose, Bld: 116 mg/dL — ABNORMAL HIGH (ref 70–99)
Potassium: 2.9 mmol/L — ABNORMAL LOW (ref 3.5–5.1)
Sodium: 136 mmol/L (ref 135–145)
Total Bilirubin: 0.6 mg/dL (ref 0.3–1.2)
Total Protein: 7.3 g/dL (ref 6.5–8.1)

## 2020-10-22 LAB — PSA: Prostatic Specific Antigen: 0.08 ng/mL (ref 0.00–4.00)

## 2020-10-22 MED ORDER — LISINOPRIL 10 MG PO TABS: 10.0000 mg | ORAL_TABLET | Freq: Every day | ORAL | 3 refills | Status: AC

## 2020-10-22 MED ORDER — HYDROMORPHONE HCL 4 MG PO TABS: 4.0000 mg | ORAL_TABLET | Freq: Three times a day (TID) | ORAL | 0 refills | Status: DC | PRN

## 2020-10-22 MED ORDER — POTASSIUM CHLORIDE CRYS ER 20 MEQ PO TBCR: 20.0000 meq | EXTENDED_RELEASE_TABLET | Freq: Every day | ORAL | 3 refills | Status: AC

## 2020-10-22 MED ORDER — POTASSIUM CHLORIDE CRYS ER 20 MEQ PO TBCR
40.0000 meq | EXTENDED_RELEASE_TABLET | Freq: Once | ORAL | Status: AC
  Administered 2020-10-22: 40 meq via ORAL
  Filled 2020-10-22: qty 2

## 2020-10-22 MED ORDER — DENOSUMAB 120 MG/1.7ML ~~LOC~~ SOLN
120.0000 mg | Freq: Once | SUBCUTANEOUS | Status: AC
  Administered 2020-10-22: 120 mg via SUBCUTANEOUS
  Filled 2020-10-22: qty 1.7

## 2020-10-22 NOTE — Telephone Encounter (Signed)
Transition Care Management Unsuccessful Follow-up Telephone Call  Date of discharge and from where:  [facility]  Diagnosis:  fall   Attempts:  3rd Attempt  Reason for unsuccessful TCM follow-up call:  Unable to reach patient

## 2020-10-22 NOTE — Addendum Note (Signed)
Addended by: Renda Rolls A on: 10/22/2020 10:37 AM   Modules accepted: Orders

## 2020-10-22 NOTE — Patient Instructions (Signed)
[place]  Discharge Instructions: Thank you for choosing [facility] to provide your oncology and hematology care.  If you have a lab appointment with the [facility], please come in thru the Main Entrance and check in at the main information desk.  Wear comfortable clothing and clothing appropriate for easy access to any Portacath or PICC line.   We strive to give you quality time with your provider. You may need to reschedule your appointment if you arrive late (15 or more minutes).  Arriving late affects you and other patients whose appointments are after yours.  Also, if you miss three or more appointments without notifying the office, you may be dismissed from the clinic at the provider's discretion.      For prescription refill requests, have your pharmacy contact our office and allow 72 hours for refills to be completed.    Today you received the following chemotherapy and/or immunotherapy agents xgeva.       To help prevent nausea and vomiting after your treatment, we encourage you to take your nausea medication as directed.  BELOW ARE SYMPTOMS THAT SHOULD BE REPORTED IMMEDIATELY: *FEVER GREATER THAN 100.4 F (38 C) OR HIGHER *CHILLS OR SWEATING *NAUSEA AND VOMITING THAT IS NOT CONTROLLED WITH YOUR NAUSEA MEDICATION *UNUSUAL SHORTNESS OF BREATH *UNUSUAL BRUISING OR BLEEDING *URINARY PROBLEMS (pain or burning when urinating, or frequent urination) *BOWEL PROBLEMS (unusual diarrhea, constipation, pain near the anus) TENDERNESS IN MOUTH AND THROAT WITH OR WITHOUT PRESENCE OF ULCERS (sore throat, sores in mouth, or a toothache) UNUSUAL RASH, SWELLING OR PAIN  UNUSUAL VAGINAL DISCHARGE OR ITCHING   Items with * indicate a potential emergency and should be followed up as soon as possible or go to the Emergency Department if any problems should occur.  Please show the CHEMOTHERAPY ALERT CARD or IMMUNOTHERAPY ALERT CARD at check-in to the Emergency  Department and triage nurse.  Should you have questions after your visit or need to cancel or reschedule your appointment, please contact [facility] [phone]  and follow the prompts.  Office hours are 8:00 a.m. to 4:30 p.m. Monday - Friday. Please note that voicemails left after 4:00 p.m. may not be returned until the following business day.  We are closed weekends and major holidays. You have access to a nurse at all times for urgent questions. Please call the main number to the clinic [phone] and follow the prompts.  For any non-urgent questions, you may also contact your provider using MyChart. We now offer e-Visits for anyone 82 and older to request care online for non-urgent symptoms. For details visit [email or web address].   Also download the MyChart app! Go to the app store, search "MyChart", open the app, select [facility], and log in with your MyChart username and password.  Due to Covid, a mask is required upon entering the hospital/clinic. If you do not have a mask, one will be given to you upon arrival. For doctor visits, patients may have 1 support person aged 71 or older with them. For treatment visits, patients cannot have anyone with them due to current Covid guidelines and our immunocompromised population.

## 2020-10-22 NOTE — Progress Notes (Signed)
Patient taking calcium as directed.  Denied tooth, jaw, and leg pain.  No recent or upcoming dental visits.  Labs reviewed.  Patient tolerated injection with no complaints voiced.  See MAR for details.  Patient stable during and after injection.  Site clean and dry with no bruising or swelling noted.  Band aid applied.  Vss with discharge and left in satisfactory condition with no s/s of distress noted.

## 2020-10-22 NOTE — Patient Instructions (Addendum)
[place] at [facility] Discharge Instructions  You were seen today by Dr. Delton Coombes. He went over your recent results, and you received your injection. Dr. Delton Coombes will see you back in 2 months for labs and follow up.   Thank you for choosing [place] at [facility] to provide your oncology and hematology care.  To afford each patient quality time with our provider, please arrive at least 15 minutes before your scheduled appointment time.   If you have a lab appointment with the [facility] please come in thru the Main Entrance and check in at the main information desk  You need to re-schedule your appointment should you arrive 10 or more minutes late.  We strive to give you quality time with our providers, and arriving late affects you and other patients whose appointments are after yours.  Also, if you no show three or more times for appointments you may be dismissed from the clinic at the providers discretion.     Again, thank you for choosing [facility].  Our hope is that these requests will decrease the amount of time that you wait before being seen by our physicians.       _____________________________________________________________  Should you have questions after your visit to [facility], please contact our office at [phone] between the hours of 8:00 a.m. and 4:30 p.m.  Voicemails left after 4:00 p.m. will not be returned until the following business day.  For prescription refill requests, have your pharmacy contact our office and allow 72 hours.    Cancer Center Support Programs:   > Cancer Support Group  2nd Tuesday of the month 1pm-2pm, Journey Room

## 2020-10-23 ENCOUNTER — Encounter (HOSPITAL_COMMUNITY): Payer: Self-pay | Admitting: Hematology

## 2020-10-27 ENCOUNTER — Other Ambulatory Visit (HOSPITAL_COMMUNITY): Payer: Self-pay | Admitting: *Deleted

## 2020-10-27 ENCOUNTER — Other Ambulatory Visit (HOSPITAL_COMMUNITY): Payer: Self-pay

## 2020-10-27 DIAGNOSIS — C61 Malignant neoplasm of prostate: Secondary | ICD-10-CM

## 2020-10-28 ENCOUNTER — Encounter (HOSPITAL_COMMUNITY): Payer: Self-pay | Admitting: Hematology

## 2020-10-28 MED ORDER — FENTANYL 25 MCG/HR TD PT72
1.0000 | MEDICATED_PATCH | TRANSDERMAL | 0 refills | Status: DC
Start: 2020-10-28 — End: 2020-11-11

## 2020-10-29 ENCOUNTER — Other Ambulatory Visit (HOSPITAL_COMMUNITY): Payer: Self-pay

## 2020-10-30 ENCOUNTER — Other Ambulatory Visit (HOSPITAL_COMMUNITY): Payer: Self-pay

## 2020-11-11 ENCOUNTER — Other Ambulatory Visit (HOSPITAL_COMMUNITY): Payer: Self-pay | Admitting: *Deleted

## 2020-11-11 DIAGNOSIS — C61 Malignant neoplasm of prostate: Secondary | ICD-10-CM

## 2020-11-11 DIAGNOSIS — C7951 Secondary malignant neoplasm of bone: Secondary | ICD-10-CM

## 2020-11-11 MED ORDER — FENTANYL 25 MCG/HR TD PT72: 1.0000 | MEDICATED_PATCH | TRANSDERMAL | 0 refills | Status: DC

## 2020-11-19 ENCOUNTER — Inpatient Hospital Stay (HOSPITAL_COMMUNITY): Payer: Medicare Other | Attending: Hematology

## 2020-11-19 ENCOUNTER — Other Ambulatory Visit: Payer: Self-pay

## 2020-11-19 ENCOUNTER — Inpatient Hospital Stay (HOSPITAL_COMMUNITY): Payer: Medicare Other

## 2020-11-19 VITALS — BP 175/80 | HR 63 | Temp 97.0°F | Resp 18

## 2020-11-19 DIAGNOSIS — C7951 Secondary malignant neoplasm of bone: Secondary | ICD-10-CM | POA: Diagnosis not present

## 2020-11-19 DIAGNOSIS — C61 Malignant neoplasm of prostate: Secondary | ICD-10-CM

## 2020-11-19 LAB — COMPREHENSIVE METABOLIC PANEL
ALT: 21 U/L (ref 0–44)
AST: 23 U/L (ref 15–41)
Albumin: 4 g/dL (ref 3.5–5.0)
Alkaline Phosphatase: 63 U/L (ref 38–126)
Anion gap: 9 (ref 5–15)
BUN: 10 mg/dL (ref 8–23)
CO2: 26 mmol/L (ref 22–32)
Calcium: 8.6 mg/dL — ABNORMAL LOW (ref 8.9–10.3)
Chloride: 101 mmol/L (ref 98–111)
Creatinine, Ser: 0.48 mg/dL — ABNORMAL LOW (ref 0.61–1.24)
GFR, Estimated: >60 mL/min (ref >60)
Glucose, Bld: 112 mg/dL — ABNORMAL HIGH (ref 70–99)
Potassium: 3.1 mmol/L — ABNORMAL LOW (ref 3.5–5.1)
Sodium: 136 mmol/L (ref 135–145)
Total Bilirubin: 0.2 mg/dL — ABNORMAL LOW (ref 0.3–1.2)
Total Protein: 7.1 g/dL (ref 6.5–8.1)

## 2020-11-19 LAB — CBC WITH DIFFERENTIAL/PLATELET
Abs Immature Granulocytes: 0.01 10*3/uL (ref 0.00–0.07)
Basophils Absolute: 0.1 10*3/uL (ref 0.0–0.1)
Basophils Relative: 1 %
Eosinophils Absolute: 0.5 10*3/uL (ref 0.0–0.5)
Eosinophils Relative: 6 %
HCT: 40.1 % (ref 39.0–52.0)
Hemoglobin: 13.4 g/dL (ref 13.0–17.0)
Immature Granulocytes: 0 %
Lymphocytes Relative: 24 %
Lymphs Abs: 1.7 10*3/uL (ref 0.7–4.0)
MCH: 30.2 pg (ref 26.0–34.0)
MCHC: 33.4 g/dL (ref 30.0–36.0)
MCV: 90.5 fL (ref 80.0–100.0)
Monocytes Absolute: 0.7 10*3/uL (ref 0.1–1.0)
Monocytes Relative: 10 %
Neutro Abs: 4.3 10*3/uL (ref 1.7–7.7)
Neutrophils Relative %: 59 %
Platelets: 252 10*3/uL (ref 150–400)
RBC: 4.43 MIL/uL (ref 4.22–5.81)
RDW: 12.7 % (ref 11.5–15.5)
WBC: 7.2 10*3/uL (ref 4.0–10.5)
nRBC: 0 % (ref 0.0–0.2)

## 2020-11-19 LAB — PSA: Prostatic Specific Antigen: 0.05 ng/mL (ref 0.00–4.00)

## 2020-11-19 MED ORDER — DENOSUMAB 120 MG/1.7ML ~~LOC~~ SOLN
120.0000 mg | Freq: Once | SUBCUTANEOUS | Status: AC
  Administered 2020-11-19: 120 mg via SUBCUTANEOUS
  Filled 2020-11-19: qty 1.7

## 2020-11-19 NOTE — Progress Notes (Signed)
Keith Olson. presents today for Xgeva injection per the provider's orders.  Patient has had no jaw pain, dental work and is taking calcium and vitamin D supplements.  Stable during administration without incident; injection site WNL; see MAR for injection details.  Patient tolerated procedure well and without incident.  No questions or complaints noted at this time. Discharge from clinic via wheelchair in stable condition.  Alert and oriented X 3.  Follow up with [facility] as scheduled.

## 2020-11-19 NOTE — Patient Instructions (Signed)
[facility]  Discharge Instructions: Thank you for choosing [facility] to provide your oncology and hematology care.  If you have a lab appointment with the [facility], please come in thru the Main Entrance and check in at the main information desk.  Wear comfortable clothing and clothing appropriate for easy access to any Portacath or PICC line.   We strive to give you quality time with your provider. You may need to reschedule your appointment if you arrive late (15 or more minutes).  Arriving late affects you and other patients whose appointments are after yours.  Also, if you miss three or more appointments without notifying the office, you may be dismissed from the clinic at the provider's discretion.      For prescription refill requests, have your pharmacy contact our office and allow 72 hours for refills to be completed.    Today you received the following chemotherapy and/or immunotherapy agents Xgeva      To help prevent nausea and vomiting after your treatment, we encourage you to take your nausea medication as directed.  BELOW ARE SYMPTOMS THAT SHOULD BE REPORTED IMMEDIATELY: *FEVER GREATER THAN 100.4 F (38 C) OR HIGHER *CHILLS OR SWEATING *NAUSEA AND VOMITING THAT IS NOT CONTROLLED WITH YOUR NAUSEA MEDICATION *UNUSUAL SHORTNESS OF BREATH *UNUSUAL BRUISING OR BLEEDING *URINARY PROBLEMS (pain or burning when urinating, or frequent urination) *BOWEL PROBLEMS (unusual diarrhea, constipation, pain near the anus) TENDERNESS IN MOUTH AND THROAT WITH OR WITHOUT PRESENCE OF ULCERS (sore throat, sores in mouth, or a toothache) UNUSUAL RASH, SWELLING OR PAIN  UNUSUAL VAGINAL DISCHARGE OR ITCHING   Items with * indicate a potential emergency and should be followed up as soon as possible or go to the Emergency Department if any problems should occur.  Please show the CHEMOTHERAPY ALERT CARD or IMMUNOTHERAPY ALERT CARD at check-in to the Emergency Department  and triage nurse.  Should you have questions after your visit or need to cancel or reschedule your appointment, please contact [facility] [phone]  and follow the prompts.  Office hours are 8:00 a.m. to 4:30 p.m. Monday - Friday. Please note that voicemails left after 4:00 p.m. may not be returned until the following business day.  We are closed weekends and major holidays. You have access to a nurse at all times for urgent questions. Please call the main number to the clinic [phone] and follow the prompts.  For any non-urgent questions, you may also contact your provider using MyChart. We now offer e-Visits for anyone 65 and older to request care online for non-urgent symptoms. For details visit [email or web address].   Also download the MyChart app! Go to the app store, search "MyChart", open the app, select [facility], and log in with your MyChart username and password.  Due to Covid, a mask is required upon entering the hospital/clinic. If you do not have a mask, one will be given to you upon arrival. For doctor visits, patients may have 1 support person aged 31 or older with them. For treatment visits, patients cannot have anyone with them due to current Covid guidelines and our immunocompromised population.

## 2020-11-20 ENCOUNTER — Telehealth (HOSPITAL_COMMUNITY): Payer: Self-pay | Admitting: Dietician

## 2020-11-20 ENCOUNTER — Encounter (HOSPITAL_COMMUNITY): Payer: Medicare Other | Admitting: Dietician

## 2020-11-20 NOTE — Telephone Encounter (Signed)
Nutrition Follow-up:  Patient with prostate cancer metastatic to bone. He is receiving Lupron and Zytiga.   Spoke with patient via telephone this morning, states he's not feeling too great but hanging in there. Patient reports appetite is the same and not eating much. He is drinking "at least" 3 Ensure everyday, patient requesting another case. Patient had an egg sandwich for breakfast, can not remember what he ate for dinner.   Medications: Klor-con, Marinol  Labs: 9/21 Glucose 112, K 3.1  Anthropometrics: Last weight 156 lb 9.6 oz on 8/24 increased from 152 lb on 8/12  6/28 -162 lb 3.2 oz 5/19 - 167 lb 8 oz  NUTRITION DIAGNOSIS: Inadequate oral intake ongoing   INTERVENTION:  Encouraged high calorie, high protein foods  Continue drinking 3 Ensure Plus/equivalent daily Will leave complimentary case of Ensure Plus for patient to pick up at registration desk - pt is no longer driving, someone to pick this up for him next week Patient has contact information    MONITORING, EVALUATION, GOAL: weight trends, intake   NEXT VISIT: via telephone in 4 weeks

## 2020-11-24 ENCOUNTER — Other Ambulatory Visit (HOSPITAL_COMMUNITY): Payer: Self-pay | Admitting: *Deleted

## 2020-11-24 DIAGNOSIS — C61 Malignant neoplasm of prostate: Secondary | ICD-10-CM

## 2020-11-24 MED ORDER — HYDROMORPHONE HCL 4 MG PO TABS: 4.0000 mg | ORAL_TABLET | Freq: Three times a day (TID) | ORAL | 0 refills | Status: AC | PRN

## 2020-11-27 ENCOUNTER — Other Ambulatory Visit (HOSPITAL_COMMUNITY): Payer: Self-pay

## 2020-12-03 ENCOUNTER — Other Ambulatory Visit (HOSPITAL_COMMUNITY): Payer: Self-pay

## 2020-12-08 ENCOUNTER — Other Ambulatory Visit (HOSPITAL_COMMUNITY): Payer: Self-pay

## 2020-12-09 ENCOUNTER — Other Ambulatory Visit (HOSPITAL_COMMUNITY): Payer: Self-pay

## 2020-12-10 ENCOUNTER — Other Ambulatory Visit (HOSPITAL_COMMUNITY): Payer: Self-pay

## 2020-12-10 DIAGNOSIS — C61 Malignant neoplasm of prostate: Secondary | ICD-10-CM

## 2020-12-10 MED ORDER — FENTANYL 25 MCG/HR TD PT72: 1.0000 | MEDICATED_PATCH | TRANSDERMAL | 0 refills | Status: DC

## 2020-12-12 ENCOUNTER — Other Ambulatory Visit (HOSPITAL_COMMUNITY): Payer: Self-pay | Admitting: *Deleted

## 2020-12-12 DIAGNOSIS — C61 Malignant neoplasm of prostate: Secondary | ICD-10-CM

## 2020-12-12 MED ORDER — FENTANYL 25 MCG/HR TD PT72: 1.0000 | MEDICATED_PATCH | TRANSDERMAL | 0 refills | Status: AC

## 2020-12-16 NOTE — Progress Notes (Signed)
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    CANCER STAGING: Cancer Staging No matching staging information was found for the patient.  INTERVAL HISTORY:  [name], a 80 y.o. male, returns for routine follow-up of his metastatic castration sensitive prostate cancer. Robertlee was last seen on 10/22/2020.   Today he reports feeling poorly, and he is accompanied by his son. He reports that he has not taken lisinopril for 2 weeks as he reports he forgets to take it. He has had a severe headache for the past 4 days. He reports occasional ankle swellings. He cannot recall if he is currently taking prednisone.   REVIEW OF SYSTEMS:  Review of Systems  Constitutional:  Positive for appetite change (no appetite) and fatigue (depleted).  Cardiovascular:  Positive for leg swelling (occasional).  Neurological:  Positive for headaches (10/10).  Psychiatric/Behavioral:  Positive for confusion (forgetful).   All other systems reviewed and are negative.  PAST MEDICAL/SURGICAL HISTORY:  Past Medical History:  Diagnosis Date   Agent orange exposure    Arthritis    Coronary artery disease    BMS to mid LAD 2001, DES to proximal LAD 2017   Enlarged prostate    XRT 2016   Headache    Sinus headaches    Heart abnormality    History of depression    Hyperlipidemia    Hypothyroidism    Prostate cancer ([place]) 07/2014   hormonal therapy, external beam radiation therapy   PTSD (post-traumatic stress disorder)    Past Surgical History:   Procedure Laterality Date   BRONCHIAL BRUSHINGS  07/02/2019   Procedure: BRONCHIAL BRUSHINGS;  Surgeon: Rigoberto Noel, MD;  Location: [facility];  Service: Cardiopulmonary;;   BRONCHIAL NEEDLE ASPIRATION BIOPSY  07/02/2019   Procedure: BRONCHIAL NEEDLE ASPIRATION BIOPSIES;  Surgeon: Rigoberto Noel, MD;  Location: [facility];  Service: Cardiopulmonary;;   BRONCHIAL WASHINGS  07/02/2019   Procedure: BRONCHIAL WASHINGS;  Surgeon: Rigoberto Noel, MD;  Location: [facility];  Service: Cardiopulmonary;;   CARDIAC CATHETERIZATION N/A 12/12/2015   Procedure: Left Heart Cath and Coronary Angiography;  Surgeon: Peter M Martinique, MD;  Location: [facility];  Service: Cardiovascular;  Laterality: N/A;   CARDIAC CATHETERIZATION N/A 12/12/2015   Procedure: Coronary Stent Intervention;  Surgeon: Peter M Martinique, MD;  Location: [facility];  Service: Cardiovascular;  Laterality: N/A;   CORONARY STENT INTERVENTION N/A 09/06/2017   Procedure: CORONARY STENT INTERVENTION;  Surgeon: Burnell Blanks, MD;  Location: [facility];  Service: Cardiovascular;  Laterality: N/A;   ENDOBRONCHIAL ULTRASOUND N/A 07/02/2019   Procedure: ENDOBRONCHIAL ULTRASOUND;  Surgeon: Rigoberto Noel, MD;  [facility];  Service: Cardiopulmonary;  Laterality: N/A;   FLEXIBLE BRONCHOSCOPY  07/02/2019   Procedure: FLEXIBLE BRONCHOSCOPY;  Surgeon: Rigoberto Noel, MD;  [facility];  Service: Cardiopulmonary;;   HERNIA REPAIR Right    LEFT HEART CATH AND CORONARY ANGIOGRAPHY N/A 09/02/2016   Procedure: Left Heart Cath and Coronary Angiography;  Surgeon: Leonie Man, MD;  Location: [facility];  Service: Cardiovascular;  Laterality: N/A;   LEFT HEART CATH AND CORONARY ANGIOGRAPHY N/A 09/06/2017   Procedure: LEFT HEART CATH AND CORONARY ANGIOGRAPHY;  Surgeon: Burnell Blanks, MD;  Location: [facility];  Service: Cardiovascular;  Laterality: N/A;   LEFT HEART CATH AND CORONARY  ANGIOGRAPHY N/A 05/04/2019   Procedure: LEFT HEART CATH AND CORONARY ANGIOGRAPHY;  Surgeon: Martinique, Peter M, MD;  Location: [facility];  Service: Cardiovascular;  Laterality: N/A;   PROSTATE BIOPSY N/A 08/28/2014   Procedure: BIOPSY TRANSRECTAL ULTRASONIC PROSTATE (TUBP);  Surgeon: Rana Snare, MD;  [facility];  Service: Urology;  Laterality: N/A;   TEE WITHOUT CARDIOVERSION N/A 04/08/2020   Procedure: TRANSESOPHAGEAL ECHOCARDIOGRAM (TEE) WITH PROPOFOL;  Surgeon: Arnoldo Lenis, MD;  [facility];  Service: Endoscopy;  Laterality: N/A;   TRANSURETHRAL RESECTION OF PROSTATE N/A 08/28/2014   Procedure: TRANSURETHRAL RESECTION OF THE PROSTATE WITH GYRUS INSTRUMENTS;  Surgeon: Rana Snare, MD;  Location: [facility];  Service: Urology;  Laterality: N/A;    SOCIAL HISTORY:  Social History   Socioeconomic History   Marital status: Married    Spouse name: Not on file   Number of children: 5   Years of education: Not on file   Highest education level: Not on file  Occupational History   Occupation: retired  Tobacco Use   Smoking status: Never   Smokeless tobacco: Former    Types: Nurse, children's Use: Never used  Substance and Sexual Activity   Alcohol use: No   Drug use: No   Sexual activity: Yes  Other Topics Concern   Not on file  Social History Narrative   Married   5 children   Social Determinants of Health   Financial Resource Strain: Not on file  Food Insecurity: Not on file  Transportation Needs: Not on file  Physical Activity: Not on file  Stress: Not on file  Social Connections: Not on file  Intimate Partner Violence: Not on file    FAMILY HISTORY:  Family History  Problem Relation Age of Onset   Arthritis Mother    Heart attack Father     CURRENT MEDICATIONS:  Current Outpatient Medications  Medication Sig Dispense Refill   abiraterone acetate (ZYTIGA) 250 MG tablet TAKE 4 TABLETS (1,000 MG TOTAL) BY MOUTH DAILY. TAKE ON AN  EMPTY STOMACH 1 HOUR BEFORE OR 2 HOURS AFTER A MEAL 120 tablet 6   ALPRAZolam (XANAX) 0.5 MG tablet 1 tablet as needed     aspirin EC 81 MG tablet Take 1 tablet (81 mg total) by mouth daily.     calcium-vitamin D (OSCAL 500/200 D-3) 500-200 MG-UNIT tablet Take 1 tablet by mouth 2 (two) times daily. 60 tablet 2   dronabinol (MARINOL) 2.5 MG capsule Take 1 capsule (2.5 mg total) by mouth 2 (two) times daily before a meal. 60 capsule 2   fentaNYL (DURAGESIC) 25 MCG/HR Place 1 patch onto the skin every 3 (three) days. 5 patch 0   fluticasone (FLONASE) 50 MCG/ACT nasal spray Place 2 sprays into both nostrils daily.     gabapentin (NEURONTIN) 400 MG capsule TAKE 1 CAPSULE BY MOUTH THREE TIMES DAILY 90 capsule 6   HYDROmorphone (DILAUDID) 4 MG tablet Take 1 tablet (4 mg total) by mouth every 8 (eight) hours as needed for severe pain. 90 tablet 0   linaclotide (LINZESS) 145 MCG CAPS capsule Take 145 mcg by mouth daily before  breakfast.     lisinopril (ZESTRIL) 10 MG tablet Take 1 tablet (10 mg total) by mouth daily. 90 tablet 3   NARCAN 4 MG/0.1ML LIQD nasal spray kit Place 1 spray into the nose once.     nitroGLYCERIN (NITROSTAT) 0.4 MG SL tablet Place 1 tablet (0.4 mg total) under the tongue every 5 (five) minutes as needed for chest pain. X 3 doses 25 tablet 12   polyethylene glycol powder (GLYCOLAX/MIRALAX) 17 GM/SCOOP powder Take 17 g by mouth daily as needed for mild constipation or moderate constipation.      potassium chloride SA (KLOR-CON) 20 MEQ tablet Take 1 tablet (20 mEq total) by mouth daily. 60 tablet 3   predniSONE (DELTASONE) 5 MG tablet TAKE 1 TABLET (5 MG TOTAL) BY MOUTH DAILY WITH BREAKFAST. 30 tablet 6   prochlorperazine (COMPAZINE) 10 MG tablet Take 1 tablet (10 mg total) by mouth every 6 (six) hours as needed for nausea or vomiting. 30 tablet 3   traZODone (DESYREL) 50 MG tablet 1 tablet at bedtime as needed     ALPRAZolam (XANAX) 0.5 MG tablet Take 1 tablet (0.5 mg total) by mouth 2  (two) times daily as needed for anxiety. (Patient not taking: Reported on 12/16/2020) 10 tablet 0   DULoxetine (CYMBALTA) 60 MG capsule Take 60 mg by mouth daily. (Patient not taking: Reported on 12/25/2020)     lactulose (CHRONULAC) 10 GM/15ML solution Take 30 mLs (20 g total) by mouth every 3 (three) hours as needed for mild constipation (take 23m po every 3 hrs until BM). (Patient not taking: Reported on 12/14/2020) 480 mL 0   No current facility-administered medications for this visit.   Facility-Administered Medications Ordered in Other Visits  Medication Dose Route Frequency Provider Last Rate Last Admin   lisinopril (ZESTRIL) tablet 10 mg  10 mg Oral Once KDerek Jack MD        ALLERGIES:  Allergies  Allergen Reactions   Lipitor [Atorvastatin]     Muscles aches    PHYSICAL EXAM:  Performance status (ECOG): 1 - Symptomatic but completely ambulatory  Vitals:   11/29/2020 1510  BP: (!) 201/110  Pulse: 65  Resp: 18  Temp: 98.1 F (36.7 C)  SpO2: 99%   Wt Readings from Last 3 Encounters:  11/30/2020 161 lb 6.4 oz (73.2 kg)  10/22/20 156 lb 9.6 oz (71 kg)  10/10/20 152 lb (68.9 kg)   Physical Exam Vitals reviewed.  Constitutional:      Appearance: Normal appearance.  Cardiovascular:     Rate and Rhythm: Normal rate and regular rhythm.     Pulses: Normal pulses.     Heart sounds: Normal heart sounds.  Pulmonary:     Effort: Pulmonary effort is normal.     Breath sounds: Normal breath sounds.  Musculoskeletal:     Right lower leg: No edema.     Left lower leg: No edema.  Neurological:     General: No focal deficit present.     Mental Status: He is alert and oriented to person, place, and time.  Psychiatric:        Mood and Affect: Mood normal.        Behavior: Behavior normal.     LABORATORY DATA:  I have reviewed the labs as listed.  CBC Latest Ref Rng & Units 12/13/2020 11/19/2020 10/22/2020  WBC 4.0 - 10.5 K/uL 7.0 7.2 6.6  Hemoglobin 13.0 - 17.0  g/dL 14.8 13.4 14.3  Hematocrit 39.0 - 52.0 % 43.6  40.1 41.3  Platelets 150 - 400 K/uL 280 252 292   CMP Latest Ref Rng & Units 11/19/2020 10/22/2020 10/13/2020  Glucose 70 - 99 mg/dL 112(H) 116(H) 101(H)  BUN 8 - 23 mg/dL _0 Creatinine 0.61 - 1.24 mg/dL 0.48(L) 0.55(L) 0.50(L)  Sodium 135 - 145 mmol/L 136 136 138  Potassium 3.5 - 5.1 mmol/L 3.1(L) 2.9(L) 3.5  Chloride 98 - 111 mmol/L 101 101 106  CO2 22 - 32 mmol/L _1 Calcium 8.9 - 10.3 mg/dL 8.6(L) 8.7(L) 8.0(L)  Total Protein 6.5 - 8.1 g/dL 7.1 7.3 -  Total Bilirubin 0.3 - 1.2 mg/dL 0.2(L) 0.6 -  Alkaline Phos 38 - 126 U/L 63 60 -  AST 15 - 41 U/L 23 19 -  ALT 0 - 44 U/L 21 20 -    DIAGNOSTIC IMAGING:  I have independently reviewed the scans and discussed with the patient. No results found.   ASSESSMENT:  1.  Metastatic castration sensitive prostate cancer: -History of prostate cancer diagnosed 08/28/2014, Gleason 4+4= 8, status post XRT and seed implants. -Presentation with low back pain of several months, PSA on 06/19/2019 was 239, testosterone 210, alkaline phosphatase 306. -MRI of the cervical, thoracic and lumbar spine on 07/27/2019 shows diffuse bone metastatic disease.  Mild to moderate compression deformity of T3 with mild ventral epidural extension.  No cord compression.  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 -CT angio of the chest, abdomen and pelvis on 06/27/2019 shows multiple lung nodules bilaterally, largest in the left upper lobe measuring 2.5 x 1.6 x 1.5 cm with associated left hilar and AP window lymphadenopathy. -Navigational bronchoscopy and biopsy on 07/02/2019 with upper lobe brushing and washing shows atypical cells.  10 L FNA also shows atypical cells. -1 pack/day for 4-5 years, quit 60 years ago.  Positive agent orange exposure -Lupron 45 mg on 07/12/2019 along with 1 month of Casodex. -PET scan on 08/11/2019  shows mildly hypermetabolic widespread sclerotic osseous metastasis throughout the axial and proximal appendicular skeleton, increased sclerosis since 06/27/2018 on CT angiogram.  This could represent response to therapy.  No hypermetabolic lymphadenopathy.  Left mediastinal and left hilar adenopathy is seen on 06/19/2019 has resolved.  Bilateral pulmonary nodules are stable to decreased in size.  Dominant left upper lobe lung nodule has decreased from 2.5 cm to 1.7 cm. -Abiraterone was started around 08/29/2019. -PET scan was done on 03/03/2020 as he complained of joint pains.  New mildly hypermetabolic mildly enlarged right subcarinal lymph node measuring 1 cm, SUV 4.1.  No other lymphadenopathy.  Widespread sclerotic lesions throughout the axial and proximal appendicular skeleton, appearing stable in distribution and increase in sclerosis.   2.  Low back pain: -10 treatments of radiation therapy completed on 07/11/2019.   PLAN:  1.  Metastatic castration sensitive prostate cancer: - He reports that he is taking Abiraterone 1000 mg daily. - His blood pressure is high today at 201/110.  Is complaining of headaches for the last 3 to 4 days. - We reviewed his labs today.  Last PSA continues to improve at 0.05. - He has severe hypokalemia with potassium 2.6.  He is also not sure if he is taking potassium daily. - This is also likely side effect of Abiraterone. - Will increase prednisone to 5 mg twice daily. - We will refer him to ER for further management of his blood pressure. - RTC 8  weeks to the clinic with repeat labs and PSA. - We will contact ER and let them know.   2.  Right hip pain: - Continue fentanyl patch 25 mcg daily. - Continue Dilaudid 4 mg 3 times daily as needed.   3.  Loss of appetite: - Continue Marinol 2.5 mg twice daily. - Continue Ensure 3 cans/day.   4.  Constipation: - Continue stool softener and MiraLAX.   5.  Bone mets: - His calcium today is 9.1.  Continue calcium  and vitamin D supplements. - We will hold his denosumab today.   6.  Hypertension: - He was told to take lisinopril without missing doses.   7.  Hot flashes: - He will continue black cohosh as needed.   8.  High risk drug monitoring: -Blood pressure today is 201/110. - He is not sure if he is taking lisinopril.  His son who is accompanying him also thinks he has not taken his blood pressure medication.  We have given lisinopril 10 mg once in our clinic today. - He is complaining of headaches. - He also has severe hypokalemia. - I will increase his prednisone to 10 mg twice daily. - I will refer him to the ER for better control of his blood pressure as he is complaining of headaches.   Orders placed this encounter:  No orders of the defined types were placed in this encounter.    Derek Jack, MD [place] [phone]   I, Thana Ates, am acting as a scribe for Dr. Derek Jack.  I, Derek Jack MD, have reviewed the above documentation for accuracy and completeness, and I agree with the above.

## 2020-12-17 ENCOUNTER — Inpatient Hospital Stay (HOSPITAL_COMMUNITY): Payer: Medicare Other

## 2020-12-17 ENCOUNTER — Other Ambulatory Visit: Payer: Self-pay

## 2020-12-17 ENCOUNTER — Inpatient Hospital Stay (HOSPITAL_COMMUNITY): Payer: Medicare Other | Attending: Hematology

## 2020-12-17 ENCOUNTER — Inpatient Hospital Stay (HOSPITAL_COMMUNITY)
Admission: EM | Admit: 2020-12-17 | Discharge: 2020-12-30 | DRG: 304 | Disposition: E | Payer: Medicare Other | Attending: Family Medicine | Admitting: Family Medicine

## 2020-12-17 ENCOUNTER — Encounter (HOSPITAL_COMMUNITY): Payer: Self-pay

## 2020-12-17 ENCOUNTER — Inpatient Hospital Stay (HOSPITAL_BASED_OUTPATIENT_CLINIC_OR_DEPARTMENT_OTHER): Payer: Medicare Other | Admitting: Hematology

## 2020-12-17 ENCOUNTER — Emergency Department (HOSPITAL_COMMUNITY): Payer: Medicare Other

## 2020-12-17 VITALS — BP 201/110 | HR 65 | Temp 98.1°F | Resp 18 | Wt 161.4 lb

## 2020-12-17 DIAGNOSIS — J9601 Acute respiratory failure with hypoxia: Secondary | ICD-10-CM | POA: Diagnosis not present

## 2020-12-17 DIAGNOSIS — E785 Hyperlipidemia, unspecified: Secondary | ICD-10-CM | POA: Diagnosis present

## 2020-12-17 DIAGNOSIS — N400 Enlarged prostate without lower urinary tract symptoms: Secondary | ICD-10-CM | POA: Diagnosis present

## 2020-12-17 DIAGNOSIS — Z7982 Long term (current) use of aspirin: Secondary | ICD-10-CM

## 2020-12-17 DIAGNOSIS — I2510 Atherosclerotic heart disease of native coronary artery without angina pectoris: Secondary | ICD-10-CM | POA: Diagnosis not present

## 2020-12-17 DIAGNOSIS — I629 Nontraumatic intracranial hemorrhage, unspecified: Secondary | ICD-10-CM | POA: Diagnosis not present

## 2020-12-17 DIAGNOSIS — R1110 Vomiting, unspecified: Secondary | ICD-10-CM | POA: Diagnosis present

## 2020-12-17 DIAGNOSIS — E876 Hypokalemia: Secondary | ICD-10-CM | POA: Diagnosis not present

## 2020-12-17 DIAGNOSIS — Z20822 Contact with and (suspected) exposure to covid-19: Secondary | ICD-10-CM | POA: Diagnosis present

## 2020-12-17 DIAGNOSIS — C61 Malignant neoplasm of prostate: Secondary | ICD-10-CM | POA: Diagnosis present

## 2020-12-17 DIAGNOSIS — I10 Essential (primary) hypertension: Secondary | ICD-10-CM | POA: Diagnosis present

## 2020-12-17 DIAGNOSIS — I959 Hypotension, unspecified: Secondary | ICD-10-CM | POA: Diagnosis not present

## 2020-12-17 DIAGNOSIS — Z79899 Other long term (current) drug therapy: Secondary | ICD-10-CM | POA: Diagnosis not present

## 2020-12-17 DIAGNOSIS — K5903 Drug induced constipation: Secondary | ICD-10-CM | POA: Diagnosis present

## 2020-12-17 DIAGNOSIS — C7951 Secondary malignant neoplasm of bone: Secondary | ICD-10-CM

## 2020-12-17 DIAGNOSIS — I160 Hypertensive urgency: Principal | ICD-10-CM | POA: Diagnosis present

## 2020-12-17 DIAGNOSIS — T40605A Adverse effect of unspecified narcotics, initial encounter: Secondary | ICD-10-CM | POA: Diagnosis present

## 2020-12-17 DIAGNOSIS — J9811 Atelectasis: Secondary | ICD-10-CM | POA: Diagnosis present

## 2020-12-17 DIAGNOSIS — Z87891 Personal history of nicotine dependence: Secondary | ICD-10-CM | POA: Diagnosis not present

## 2020-12-17 DIAGNOSIS — R0602 Shortness of breath: Secondary | ICD-10-CM | POA: Diagnosis not present

## 2020-12-17 DIAGNOSIS — F4310 Post-traumatic stress disorder, unspecified: Secondary | ICD-10-CM | POA: Diagnosis present

## 2020-12-17 DIAGNOSIS — R531 Weakness: Secondary | ICD-10-CM | POA: Diagnosis present

## 2020-12-17 DIAGNOSIS — T464X6A Underdosing of angiotensin-converting-enzyme inhibitors, initial encounter: Secondary | ICD-10-CM | POA: Diagnosis present

## 2020-12-17 DIAGNOSIS — E039 Hypothyroidism, unspecified: Secondary | ICD-10-CM | POA: Diagnosis not present

## 2020-12-17 DIAGNOSIS — G9341 Metabolic encephalopathy: Secondary | ICD-10-CM | POA: Diagnosis not present

## 2020-12-17 DIAGNOSIS — Z9114 Patient's other noncompliance with medication regimen: Secondary | ICD-10-CM

## 2020-12-17 DIAGNOSIS — Z888 Allergy status to other drugs, medicaments and biological substances status: Secondary | ICD-10-CM | POA: Diagnosis not present

## 2020-12-17 DIAGNOSIS — Z66 Do not resuscitate: Secondary | ICD-10-CM | POA: Diagnosis not present

## 2020-12-17 DIAGNOSIS — Z515 Encounter for palliative care: Secondary | ICD-10-CM | POA: Diagnosis not present

## 2020-12-17 DIAGNOSIS — R1115 Cyclical vomiting syndrome unrelated to migraine: Secondary | ICD-10-CM

## 2020-12-17 DIAGNOSIS — R519 Headache, unspecified: Secondary | ICD-10-CM | POA: Diagnosis not present

## 2020-12-17 DIAGNOSIS — Z8249 Family history of ischemic heart disease and other diseases of the circulatory system: Secondary | ICD-10-CM | POA: Diagnosis not present

## 2020-12-17 DIAGNOSIS — Z923 Personal history of irradiation: Secondary | ICD-10-CM | POA: Diagnosis not present

## 2020-12-17 DIAGNOSIS — F321 Major depressive disorder, single episode, moderate: Secondary | ICD-10-CM | POA: Diagnosis present

## 2020-12-17 DIAGNOSIS — Z9221 Personal history of antineoplastic chemotherapy: Secondary | ICD-10-CM

## 2020-12-17 DIAGNOSIS — I4891 Unspecified atrial fibrillation: Secondary | ICD-10-CM | POA: Diagnosis present

## 2020-12-17 LAB — BASIC METABOLIC PANEL
Anion gap: 12 (ref 5–15)
BUN: 10 mg/dL (ref 8–23)
CO2: 28 mmol/L (ref 22–32)
Calcium: 9.1 mg/dL (ref 8.9–10.3)
Chloride: 100 mmol/L (ref 98–111)
Creatinine, Ser: 0.56 mg/dL — ABNORMAL LOW (ref 0.61–1.24)
GFR, Estimated: >60 mL/min (ref >60)
Glucose, Bld: 133 mg/dL — ABNORMAL HIGH (ref 70–99)
Potassium: 2.5 mmol/L — CL (ref 3.5–5.1)
Sodium: 140 mmol/L (ref 135–145)

## 2020-12-17 LAB — CBC WITH DIFFERENTIAL/PLATELET
Abs Immature Granulocytes: 0.02 10*3/uL (ref 0.00–0.07)
Abs Immature Granulocytes: 0.03 10*3/uL (ref 0.00–0.07)
Basophils Absolute: 0.1 10*3/uL (ref 0.0–0.1)
Basophils Absolute: 0.1 10*3/uL (ref 0.0–0.1)
Basophils Relative: 1 %
Basophils Relative: 1 %
Eosinophils Absolute: 0.4 10*3/uL (ref 0.0–0.5)
Eosinophils Absolute: 0.4 10*3/uL (ref 0.0–0.5)
Eosinophils Relative: 6 %
Eosinophils Relative: 3 %
HCT: 43.6 % (ref 39.0–52.0)
HCT: 43.3 % (ref 39.0–52.0)
Hemoglobin: 14.8 g/dL (ref 13.0–17.0)
Hemoglobin: 14.8 g/dL (ref 13.0–17.0)
Immature Granulocytes: 0 %
Immature Granulocytes: 0 %
Lymphocytes Relative: 29 %
Lymphocytes Relative: 16 %
Lymphs Abs: 2 10*3/uL (ref 0.7–4.0)
Lymphs Abs: 1.9 10*3/uL (ref 0.7–4.0)
MCH: 30.8 pg (ref 26.0–34.0)
MCH: 30.8 pg (ref 26.0–34.0)
MCHC: 33.9 g/dL (ref 30.0–36.0)
MCHC: 34.2 g/dL (ref 30.0–36.0)
MCV: 90.8 fL (ref 80.0–100.0)
MCV: 90.2 fL (ref 80.0–100.0)
Monocytes Absolute: 0.7 10*3/uL (ref 0.1–1.0)
Monocytes Absolute: 0.8 10*3/uL (ref 0.1–1.0)
Monocytes Relative: 10 %
Monocytes Relative: 7 %
Neutro Abs: 3.9 10*3/uL (ref 1.7–7.7)
Neutro Abs: 9 10*3/uL — ABNORMAL HIGH (ref 1.7–7.7)
Neutrophils Relative %: 54 %
Neutrophils Relative %: 73 %
Platelets: 280 10*3/uL (ref 150–400)
Platelets: 282 10*3/uL (ref 150–400)
RBC: 4.8 MIL/uL (ref 4.22–5.81)
RBC: 4.8 MIL/uL (ref 4.22–5.81)
RDW: 13.2 % (ref 11.5–15.5)
RDW: 13.2 % (ref 11.5–15.5)
WBC: 7 10*3/uL (ref 4.0–10.5)
WBC: 12.3 10*3/uL — ABNORMAL HIGH (ref 4.0–10.5)
nRBC: 0 % (ref 0.0–0.2)
nRBC: 0 % (ref 0.0–0.2)

## 2020-12-17 LAB — COMPREHENSIVE METABOLIC PANEL
ALT: 23 U/L (ref 0–44)
AST: 23 U/L (ref 15–41)
Albumin: 4.3 g/dL (ref 3.5–5.0)
Alkaline Phosphatase: 72 U/L (ref 38–126)
Anion gap: 11 (ref 5–15)
BUN: 12 mg/dL (ref 8–23)
CO2: 29 mmol/L (ref 22–32)
Calcium: 9.1 mg/dL (ref 8.9–10.3)
Chloride: 93 mmol/L — ABNORMAL LOW (ref 98–111)
Creatinine, Ser: 0.55 mg/dL — ABNORMAL LOW (ref 0.61–1.24)
GFR, Estimated: >60 mL/min (ref >60)
Glucose, Bld: 115 mg/dL — ABNORMAL HIGH (ref 70–99)
Potassium: 2.6 mmol/L — CL (ref 3.5–5.1)
Sodium: 133 mmol/L — ABNORMAL LOW (ref 135–145)
Total Bilirubin: 0.5 mg/dL (ref 0.3–1.2)
Total Protein: 7.5 g/dL (ref 6.5–8.1)

## 2020-12-17 LAB — RESP PANEL BY RT-PCR (FLU A&B, COVID) ARPGX2
Influenza A by PCR: NEGATIVE
Influenza B by PCR: NEGATIVE
SARS Coronavirus 2 by RT PCR: NEGATIVE

## 2020-12-17 LAB — PSA: Prostatic Specific Antigen: 0.05 ng/mL (ref 0.00–4.00)

## 2020-12-17 LAB — MAGNESIUM: Magnesium: 2.4 mg/dL (ref 1.7–2.4)

## 2020-12-17 IMAGING — CT CT HEAD W/O CM
3 series · 16 of 47 positions shown, 19 images · non-contrast
Comparison: [DATE]

CLINICAL DATA: Headache, new or worsening (Age >= 50y)

EXAM:
CT HEAD WITHOUT CONTRAST
TECHNIQUE: Contiguous axial images were obtained from the base of the skull
through the vertex without intravenous contrast.

[Series 2: head w o · axial · 0.40mm/px · z∈[+49,+179]mm · 10 of 32 slices shown, 13 images]
[im 3/32  brain]
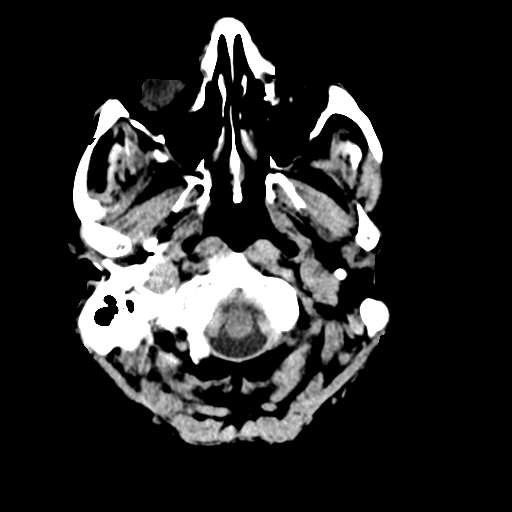
[im 3/32  bone]
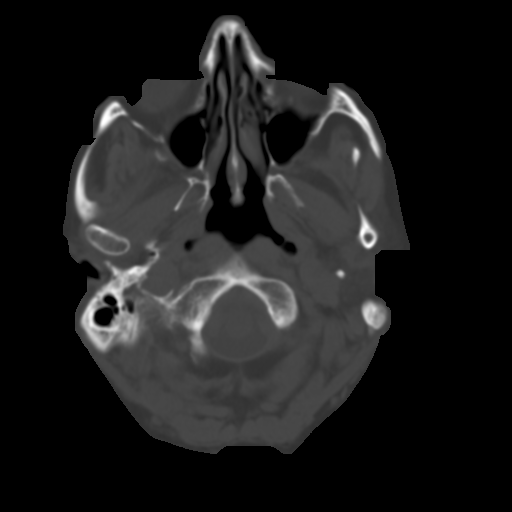
[im 6/32  brain]
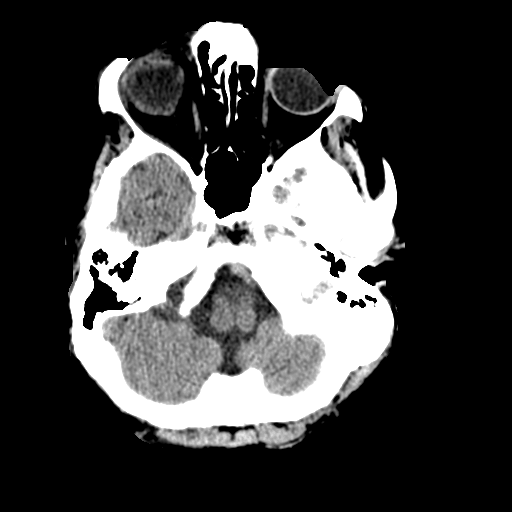
[im 9/32  brain]
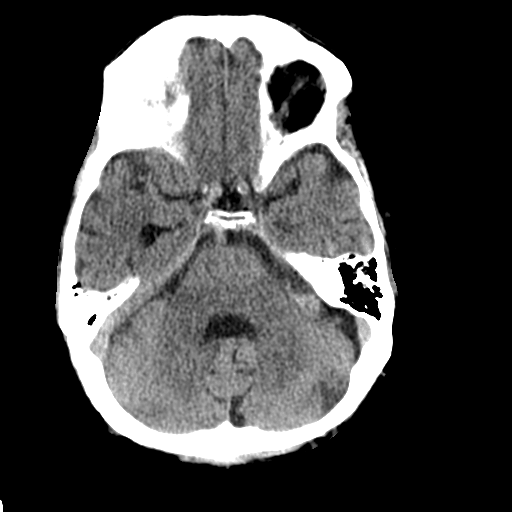
[im 11/32  brain]
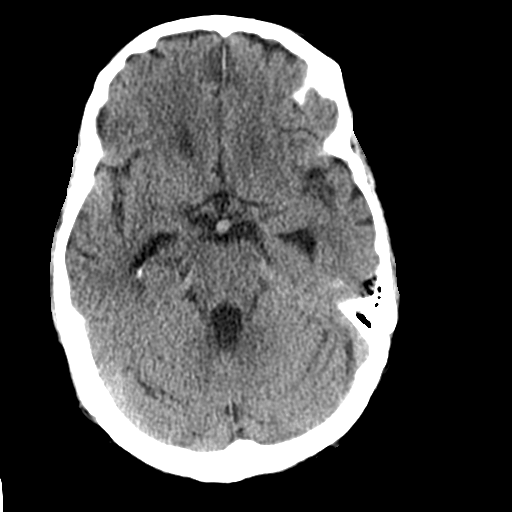
[im 14/32  brain]
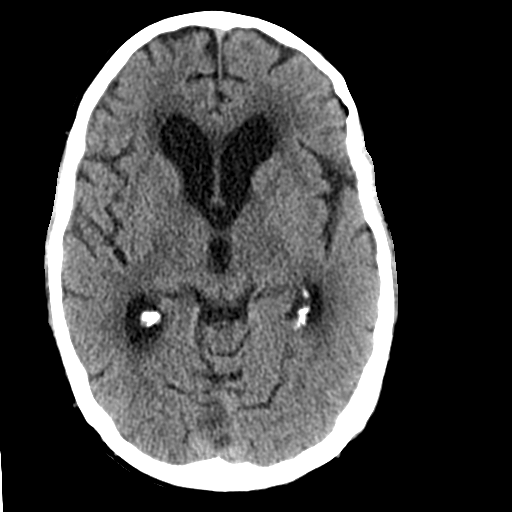
[im 14/32  bone]
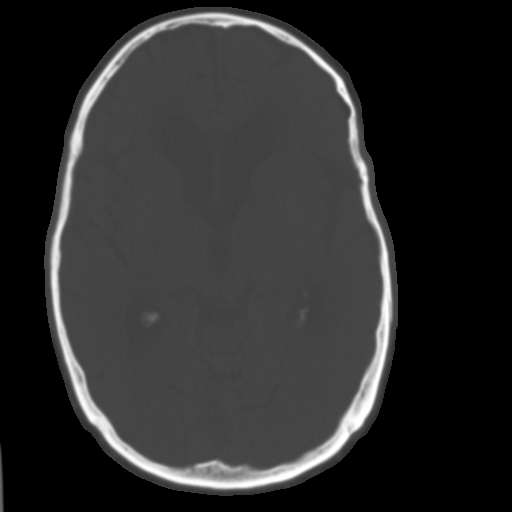
[im 18/32  brain]
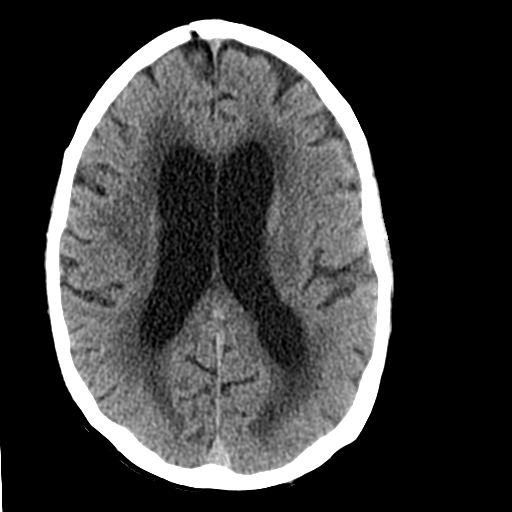
[im 21/32  brain]
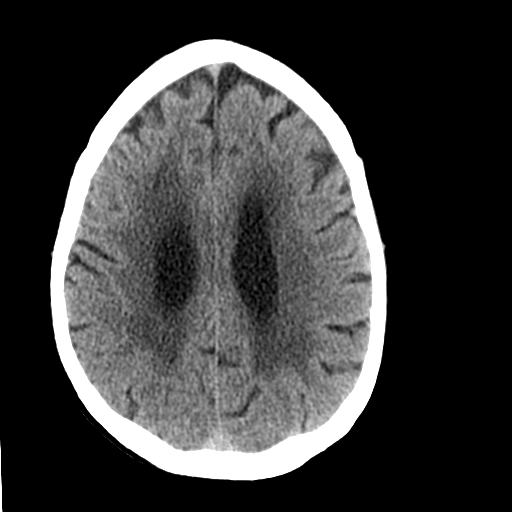
[im 24/32  brain]
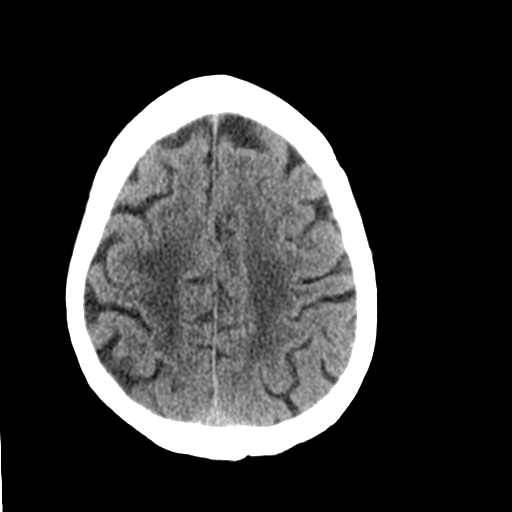
[im 26/32  brain]
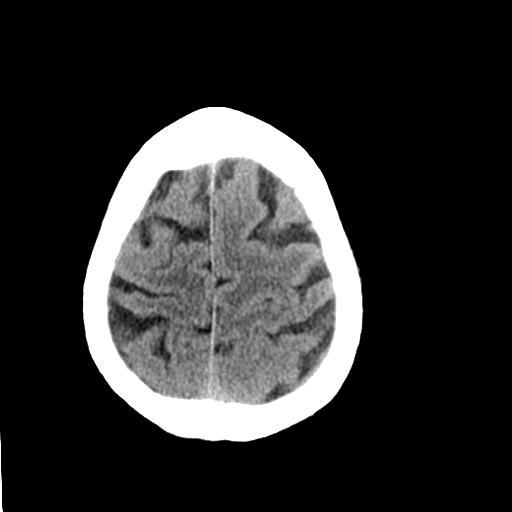
[im 26/32  bone]
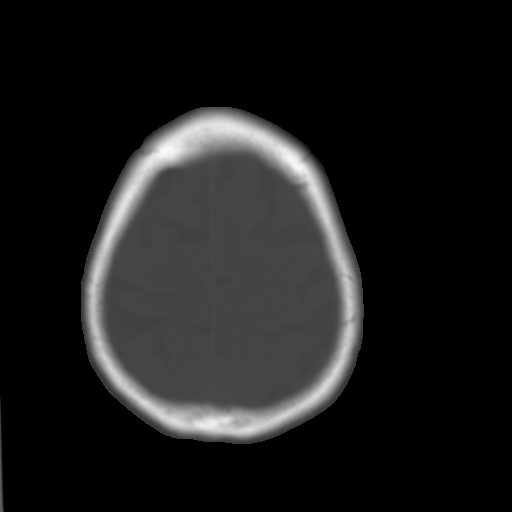
[im 29/32  brain]
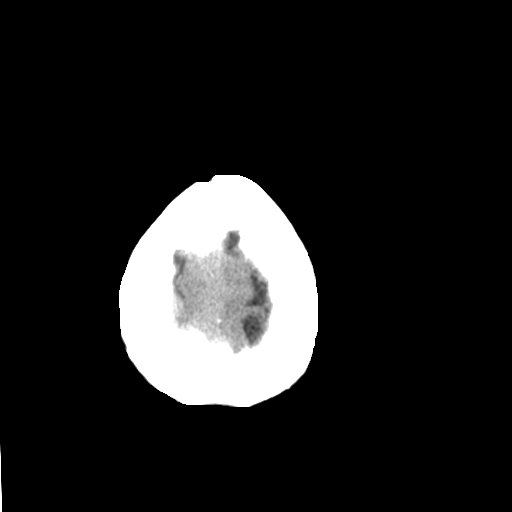

[Series 4: coronal soft · coronal · 0.31mm/px · 3 of 69 slices shown]
[im 23/69  brain]
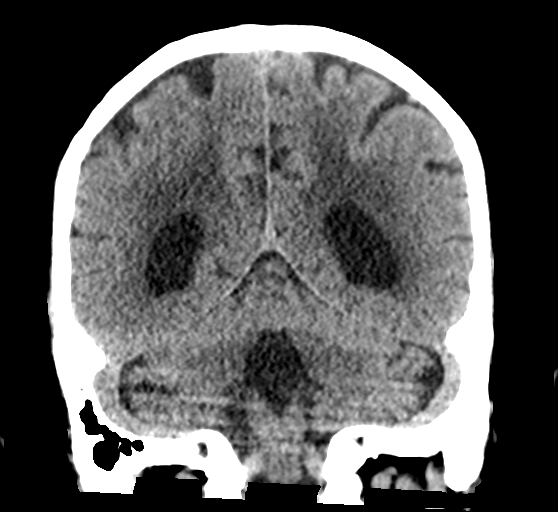
[im 31/69  brain]
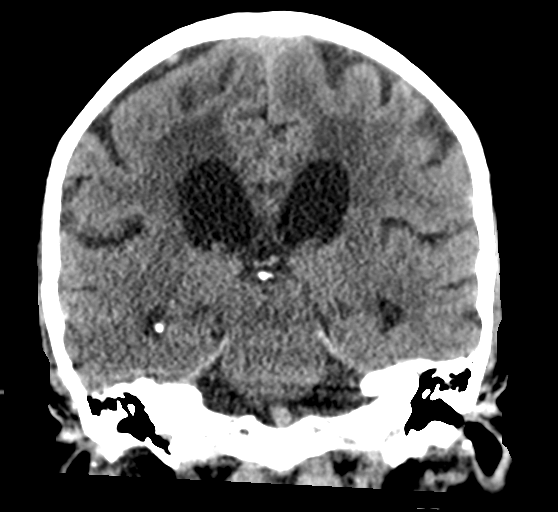
[im 38/69  brain]
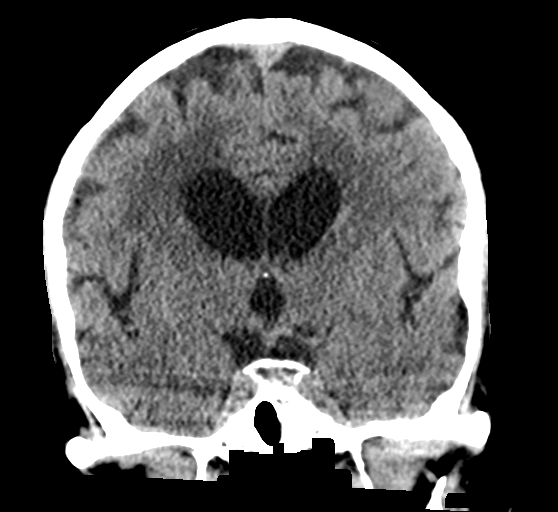

[Series 5: sagittal soft · sagittal · 0.33mm/px · 3 of 55 slices shown]
[im 19/55  brain]
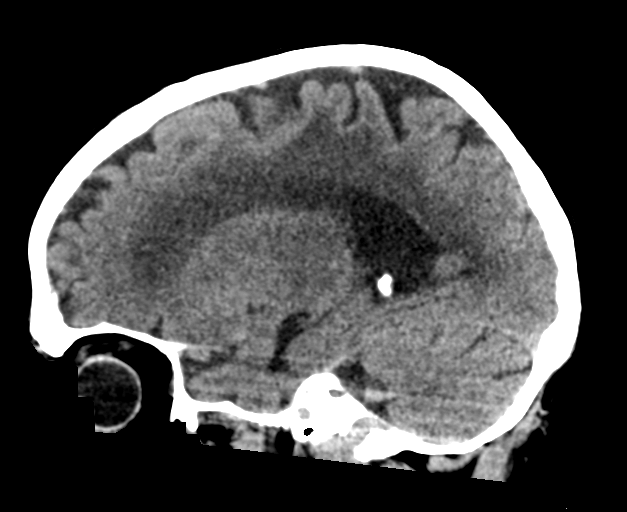
[im 28/55  brain]
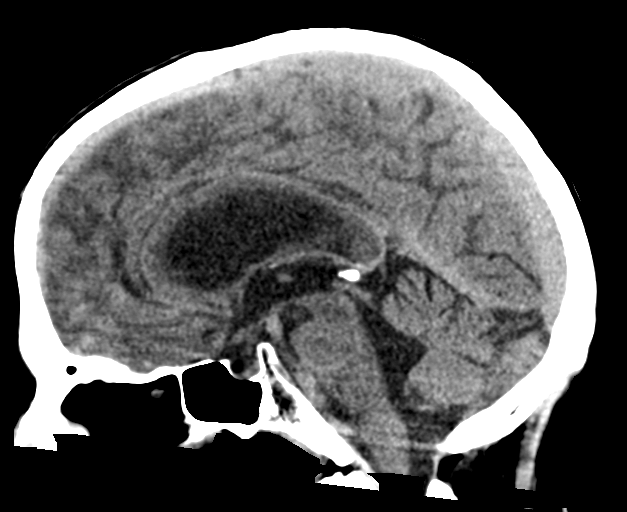
[im 37/55  brain]
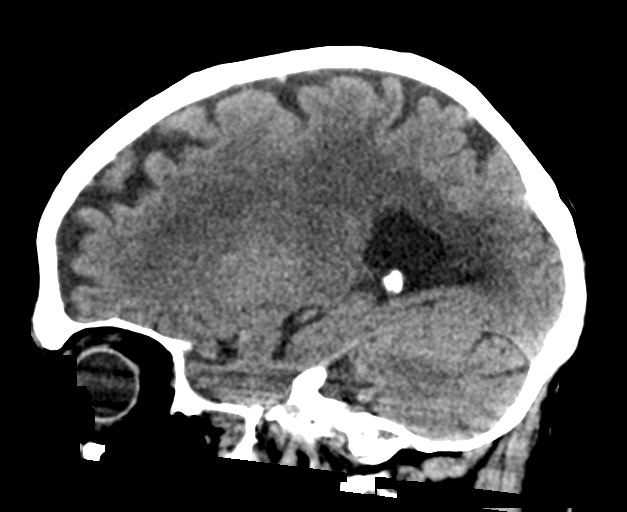

[16 of 47 positions shown; findings below may reference images not displayed]

FINDINGS: Brain: There is atrophy and chronic small vessel disease changes. No
acute intracranial abnormality. Specifically, no hemorrhage,
hydrocephalus, mass lesion, acute infarction, or significant
intracranial injury.

Vascular: No hyperdense vessel or unexpected calcification.

Skull: No acute calvarial abnormality.

Sinuses/Orbits: No acute findings

Other: None
IMPRESSION: Atrophy, chronic microvascular disease.

No acute intracranial abnormality.

## 2020-12-17 MED ORDER — PREDNISONE 5 MG PO TABS: 5.0000 mg | ORAL_TABLET | Freq: Two times a day (BID) | ORAL | 6 refills | Status: DC

## 2020-12-17 MED ORDER — ENALAPRILAT 1.25 MG/ML IV SOLN
1.2500 mg | Freq: Once | INTRAVENOUS | Status: AC
  Administered 2020-12-17: 1.25 mg via INTRAVENOUS
  Filled 2020-12-17: qty 2

## 2020-12-17 MED ORDER — POTASSIUM CHLORIDE 10 MEQ/100ML IV SOLN
10.0000 meq | Freq: Once | INTRAVENOUS | Status: AC
  Administered 2020-12-17: 10 meq via INTRAVENOUS
  Filled 2020-12-17: qty 100

## 2020-12-17 MED ORDER — PREDNISONE 5 MG PO TABS: 5.0000 mg | ORAL_TABLET | Freq: Two times a day (BID) | ORAL | 6 refills | Status: AC

## 2020-12-17 MED ORDER — ONDANSETRON HCL 4 MG/2ML IJ SOLN: 4.0000 mg | Freq: Once | INTRAMUSCULAR | Status: AC

## 2020-12-17 MED ORDER — ONDANSETRON HCL 4 MG/2ML IJ SOLN
INTRAMUSCULAR | Status: AC
  Administered 2020-12-17: 4 mg via INTRAVENOUS
  Filled 2020-12-17: qty 2

## 2020-12-17 MED ORDER — SODIUM CHLORIDE 0.9 % IV SOLN
12.5000 mg | Freq: Once | INTRAVENOUS | Status: AC
  Administered 2020-12-17: 12.5 mg via INTRAVENOUS
  Filled 2020-12-17: qty 0.5

## 2020-12-17 MED ORDER — HYDROMORPHONE HCL 1 MG/ML IJ SOLN
0.5000 mg | Freq: Once | INTRAMUSCULAR | Status: AC
  Administered 2020-12-17: 0.5 mg via INTRAVENOUS
  Filled 2020-12-17: qty 1

## 2020-12-17 MED ORDER — PROMETHAZINE HCL 25 MG/ML IJ SOLN
INTRAMUSCULAR | Status: AC
  Filled 2020-12-17: qty 1

## 2020-12-17 MED ORDER — POTASSIUM CHLORIDE IN NACL 40-0.9 MEQ/L-% IV SOLN
INTRAVENOUS | Status: AC
  Filled 2020-12-17 (×2): qty 1000

## 2020-12-17 MED ORDER — LABETALOL HCL 5 MG/ML IV SOLN
10.0000 mg | Freq: Once | INTRAVENOUS | Status: AC
  Administered 2020-12-17: 10 mg via INTRAVENOUS
  Filled 2020-12-17: qty 4

## 2020-12-17 MED ORDER — NICARDIPINE HCL IN NACL 20-0.86 MG/200ML-% IV SOLN
3.0000 mg/h | INTRAVENOUS | Status: DC
Start: 2020-12-17 — End: 2020-12-18
  Administered 2020-12-17 – 2020-12-18 (×2): 3 mg/h via INTRAVENOUS
  Filled 2020-12-17 (×2): qty 200

## 2020-12-17 MED ORDER — LISINOPRIL 10 MG PO TABS
20.0000 mg | ORAL_TABLET | Freq: Every day | ORAL | Status: DC
  Administered 2020-12-18: 20 mg via ORAL
  Filled 2020-12-17: qty 2

## 2020-12-17 MED ORDER — HYDRALAZINE HCL 10 MG PO TABS
10.0000 mg | ORAL_TABLET | Freq: Once | ORAL | Status: AC
  Administered 2020-12-17: 10 mg via ORAL
  Filled 2020-12-17: qty 1

## 2020-12-17 MED ORDER — POTASSIUM CHLORIDE CRYS ER 20 MEQ PO TBCR
40.0000 meq | EXTENDED_RELEASE_TABLET | Freq: Once | ORAL | Status: AC
  Administered 2020-12-17: 40 meq via ORAL
  Filled 2020-12-17: qty 2

## 2020-12-17 MED ORDER — LISINOPRIL 10 MG PO TABS
10.0000 mg | ORAL_TABLET | Freq: Once | ORAL | Status: AC
  Administered 2020-12-17: 10 mg via ORAL
  Filled 2020-12-17: qty 1

## 2020-12-17 MED ORDER — MAGNESIUM SULFATE 2 GM/50ML IV SOLN
2.0000 g | Freq: Once | INTRAVENOUS | Status: AC
  Administered 2020-12-17: 2 g via INTRAVENOUS
  Filled 2020-12-17: qty 50

## 2020-12-17 MED ORDER — HYDRALAZINE HCL 20 MG/ML IJ SOLN
20.0000 mg | Freq: Once | INTRAMUSCULAR | Status: AC
  Administered 2020-12-17: 20 mg via INTRAVENOUS
  Filled 2020-12-17: qty 1

## 2020-12-17 MED ORDER — POTASSIUM CHLORIDE 10 MEQ/100ML IV SOLN
10.0000 meq | INTRAVENOUS | Status: AC
  Administered 2020-12-17 – 2020-12-18 (×3): 10 meq via INTRAVENOUS
  Filled 2020-12-17 (×3): qty 100

## 2020-12-17 MED ORDER — AMLODIPINE BESYLATE 5 MG PO TABS
5.0000 mg | ORAL_TABLET | Freq: Every day | ORAL | Status: DC
  Administered 2020-12-18: 5 mg via ORAL
  Filled 2020-12-17: qty 1

## 2020-12-17 MED ORDER — LABETALOL HCL 5 MG/ML IV SOLN
20.0000 mg | Freq: Once | INTRAVENOUS | Status: AC
  Administered 2020-12-17: 20 mg via INTRAVENOUS
  Filled 2020-12-17: qty 4

## 2020-12-17 NOTE — ED Notes (Signed)
Pt to bathroom via wheelchair.

## 2020-12-17 NOTE — ED Provider Notes (Signed)
[facility] EMERGENCY DEPARTMENT Provider Note   CSN: [id number] Arrival date & time: 12/02/2020  1554     History Chief Complaint  Patient presents with   Hypertension    Keith Olson. is a 80 y.o. male with a history including CAD, hyperlipidemia, hypothyroidism, prostate cancer under the care of Dr. Delton Coombes presenting for evaluation of A. fib 4-day history of severe generalized headache along with the noted potassium of 2.6.  He was at the cancer center at which time labs were revealing for potassium of 2.6.  He was also very hypertensive with a blood pressure of 201/110.  He states that he has not taken his lisinopril in approximately 2 weeks, stating he forgets to take this medication.  He was given 10 mg of lisinopril p.o. prior to being transferred down here for further evaluation.  He denies focal weakness or dizziness but does endorse generalized fatigue at baseline.  He denies a history of migraine headaches, also denies chest pain, shortness of breath, peripheral edema.  He has had no nausea or vomiting, he does have a poor appetite but last ate lunch prior to his office visit today.  Of note, on presentation here his blood pressure is modestly improved at 175/107.  He still has a persistent headache which he reports is unchanged.  The history is provided by the patient.      Past Medical History:  Diagnosis Date   Agent orange exposure    Arthritis    Coronary artery disease    BMS to mid LAD 2001, DES to proximal LAD 2017   Enlarged prostate    XRT 2016   Headache    Sinus headaches    Heart abnormality    History of depression    Hyperlipidemia    Hypothyroidism    Prostate cancer ([place]) 07/2014   hormonal therapy, external beam radiation therapy   PTSD (post-traumatic stress disorder)     Patient Active Problem List   Diagnosis Date Noted   Fall 10/10/2020   Hypokalemia 10/10/2020   Dysmetria 10/10/2020   Bacteremia due to Staphylococcus aureus  04/09/2020   Staphylococcus aureus bacteremia 04/07/2020   Community acquired pneumonia 04/04/2020   DNR (do not resuscitate) 04/04/2020   Acute bacterial sinusitis 04/04/2020   Leukocytosis 04/04/2020   Weakness 07/22/2019   Acute lower UTI 07/22/2019   Protein calorie malnutrition ([place]) 07/22/2019   Urinary retention 07/18/2019   Hyponatremia 06/29/2019   Acute bilateral low back pain with sciatica    Multiple lung nodules on CT    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   Unstable angina ([place])    History of depression 09/05/2017   Hypothyroidism 09/05/2017   Depression, major, single episode, 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/26/2016   Prostatitis 11/26/2016   Rectal bleeding 02/25/2016   Pain management contract signed 01/27/2016   Constipation 04/22/2015   Lower abdominal pain 04/22/2015   Anxiety state 12/16/2014   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   Hyperlipidemia 05/22/2012   BPH (benign prostatic hyperplasia) 05/22/2012   Chest pain 02/24/2012   CAD (coronary artery disease) 02/24/2012    Past Surgical History:  Procedure Laterality Date   BRONCHIAL BRUSHINGS  07/02/2019   Procedure: BRONCHIAL BRUSHINGS;  Surgeon: Rigoberto Noel, MD;  Location: [facility];  Service: Cardiopulmonary;;   BRONCHIAL NEEDLE ASPIRATION BIOPSY  07/02/2019   Procedure: BRONCHIAL NEEDLE ASPIRATION BIOPSIES;  Surgeon: Rigoberto Noel, MD;  Location: [facility];  Service: Cardiopulmonary;;   BRONCHIAL WASHINGS  07/02/2019   Procedure: BRONCHIAL WASHINGS;  Surgeon: Rigoberto Noel, MD;  Location: [facility];  Service: Cardiopulmonary;;   CARDIAC CATHETERIZATION N/A 12/12/2015   Procedure: Left Heart Cath and Coronary Angiography;  Surgeon: Peter M Martinique, MD;  Location: [place] [facility];  Service: Cardiovascular;  Laterality: N/A;   CARDIAC CATHETERIZATION N/A 12/12/2015   Procedure: Coronary Stent  Intervention;  Surgeon: Peter M Martinique, MD;  Location: [place] [facility];  Service: Cardiovascular;  Laterality: N/A;   CORONARY STENT INTERVENTION N/A 09/06/2017   Procedure: CORONARY STENT INTERVENTION;  Surgeon: Burnell Blanks, MD;  Location: [place] [facility];  Service: Cardiovascular;  Laterality: N/A;   ENDOBRONCHIAL ULTRASOUND N/A 07/02/2019   Procedure: ENDOBRONCHIAL ULTRASOUND;  Surgeon: Rigoberto Noel, MD;  [facility];  Service: Cardiopulmonary;  Laterality: N/A;   FLEXIBLE BRONCHOSCOPY  07/02/2019   Procedure: FLEXIBLE BRONCHOSCOPY;  Surgeon: Rigoberto Noel, MD;  [facility];  Service: Cardiopulmonary;;   HERNIA REPAIR Right    LEFT HEART CATH AND CORONARY ANGIOGRAPHY N/A 09/02/2016   Procedure: Left Heart Cath and Coronary Angiography;  Surgeon: Leonie Man, MD;  Location: [place] [facility];  Service: Cardiovascular;  Laterality: N/A;   LEFT HEART CATH AND CORONARY ANGIOGRAPHY N/A 09/06/2017   Procedure: LEFT HEART CATH AND CORONARY ANGIOGRAPHY;  Surgeon: Burnell Blanks, MD;  Location: [place] [facility];  Service: Cardiovascular;  Laterality: N/A;   LEFT HEART CATH AND CORONARY ANGIOGRAPHY N/A 05/04/2019   Procedure: LEFT HEART CATH AND CORONARY ANGIOGRAPHY;  Surgeon: Martinique, Peter M, MD;  Location: [place] [facility];  Service: Cardiovascular;  Laterality: N/A;   PROSTATE BIOPSY N/A 08/28/2014   Procedure: BIOPSY TRANSRECTAL ULTRASONIC PROSTATE (TUBP);  Surgeon: Rana Snare, MD;  [facility];  Service: Urology;  Laterality: N/A;   TEE WITHOUT CARDIOVERSION N/A 04/08/2020   Procedure: TRANSESOPHAGEAL ECHOCARDIOGRAM (TEE) WITH PROPOFOL;  Surgeon: Arnoldo Lenis, MD;  [facility];  Service: Endoscopy;  Laterality: N/A;   TRANSURETHRAL RESECTION OF PROSTATE N/A 08/28/2014   Procedure: TRANSURETHRAL RESECTION OF THE PROSTATE WITH GYRUS INSTRUMENTS;  Surgeon: Rana Snare, MD;  Location: [facility];  Service: Urology;  Laterality: N/A;        Family History  Problem Relation Age of Onset   Arthritis Mother    Heart attack Father     Social History   Tobacco Use   Smoking status: Never   Smokeless tobacco: Former    Types: Nurse, children's Use: Never used  Substance Use Topics   Alcohol use: No   Drug use: No    Home Medications Prior to Admission medications   Medication Sig Start Date End Date Taking? Authorizing Provider  abiraterone acetate (ZYTIGA) 250 MG tablet TAKE 4 TABLETS (1,000 MG TOTAL) BY MOUTH DAILY. TAKE ON AN EMPTY STOMACH 1 HOUR BEFORE OR 2 HOURS AFTER A MEAL 06/10/20 06/10/21 Yes Derek Jack, MD  ALPRAZolam Duanne Moron) 0.5 MG tablet Take 1 tablet (0.5 mg total) by mouth 2 (two) times daily as needed for anxiety. 07/25/19  Yes Johnson, Clanford L, MD  aspirin EC 81 MG tablet Take 1 tablet (81 mg total) by mouth daily. 12/09/15  Yes Satira Sark, MD  calcium-vitamin D (OSCAL 500/200 D-3) 500-200 MG-UNIT tablet Take 1 tablet by mouth 2 (two) times daily. 10/13/20 10/13/21 Yes Pokhrel, Corrie Mckusick, MD  fentaNYL (DURAGESIC) 25 MCG/HR Place 1 patch  onto the skin every 3 (three) days. 12/12/20  Yes Derek Jack, MD  gabapentin (NEURONTIN) 400 MG capsule TAKE 1 CAPSULE BY MOUTH THREE TIMES DAILY 10/21/20  Yes Derek Jack, MD  HYDROmorphone (DILAUDID) 4 MG tablet Take 1 tablet (4 mg total) by mouth every 8 (eight) hours as needed for severe pain. 11/24/20  Yes Derek Jack, MD  lisinopril (ZESTRIL) 10 MG tablet Take 1 tablet (10 mg total) by mouth daily. 10/22/20  Yes Derek Jack, MD  nitroGLYCERIN (NITROSTAT) 0.4 MG SL tablet Place 1 tablet (0.4 mg total) under the tongue every 5 (five) minutes as needed for chest pain. X 3 doses 09/07/17  Yes Bhagat, Bhavinkumar, PA  polyethylene glycol powder (GLYCOLAX/MIRALAX) 17 GM/SCOOP powder Take 17 g by mouth daily as needed for mild constipation or moderate constipation.    Yes [provider]  potassium chloride  SA (KLOR-CON) 20 MEQ tablet Take 1 tablet (20 mEq total) by mouth daily. 10/22/20  Yes Derek Jack, MD  prochlorperazine (COMPAZINE) 10 MG tablet Take 1 tablet (10 mg total) by mouth every 6 (six) hours as needed for nausea or vomiting. 07/10/20  Yes Derek Jack, MD  traZODone (DESYREL) 50 MG tablet 1 tablet at bedtime as needed 10/09/20  Yes [provider]  ALPRAZolam Duanne Moron) 0.5 MG tablet 1 tablet as needed Patient not taking: Reported on 12/23/2020 11/27/20   [provider]  dronabinol (MARINOL) 2.5 MG capsule Take 1 capsule (2.5 mg total) by mouth 2 (two) times daily before a meal. Patient not taking: Reported on 11/29/2020 04/25/20   Derek Jack, MD  DULoxetine (CYMBALTA) 60 MG capsule Take 60 mg by mouth daily. Patient not taking: Reported on 12/25/2020 08/09/19   [provider]  fluticasone (FLONASE) 50 MCG/ACT nasal spray Place 2 sprays into both nostrils daily. Patient not taking: Reported on 12/11/2020 12/26/19   [provider]  lactulose (CHRONULAC) 10 GM/15ML solution Take 30 mLs (20 g total) by mouth every 3 (three) hours as needed for mild constipation (take 47m po every 3 hrs until BM). Patient not taking: Reported on 12/20/2020 07/02/20   KDerek Jack MD  linaclotide (Rolan Lipa 145 MCG CAPS capsule Take 145 mcg by mouth daily before breakfast. Patient not taking: Reported on 12/03/2020 07/02/20   [provider]  NARCAN 4 MG/0.1ML LIQD nasal spray kit Place 1 spray into the nose once. Patient not taking: Reported on 12/24/2020 08/10/19   [provider]  predniSONE (DELTASONE) 5 MG tablet Take 1 tablet (5 mg total) by mouth 2 (two) times daily with a meal. Patient not taking: Reported on 12/09/2020 12/03/2020   KDerek Jack MD    Allergies    Lipitor [atorvastatin]  Review of Systems   Review of Systems  Constitutional:  Positive for appetite change and fatigue. Negative for fever.   HENT:  Negative for congestion and sore throat.   Eyes: Negative.  Negative for visual disturbance.  Respiratory:  Negative for chest tightness and shortness of breath.   Cardiovascular:  Negative for chest pain, palpitations and leg swelling.  Gastrointestinal:  Negative for abdominal pain, nausea and vomiting.  Genitourinary: Negative.   Musculoskeletal:  Negative for arthralgias, joint swelling and neck pain.  Skin: Negative.  Negative for rash and wound.  Neurological:  Positive for headaches. Negative for dizziness, weakness, light-headedness and numbness.  Psychiatric/Behavioral: Negative.    All other systems reviewed and are negative.  Physical Exam Updated Vital Signs BP (!) 231/101   Pulse 64  Temp 97.8 F (36.6 C) (Oral)   Resp (!) 29   Ht _0  (1.727 m)   Wt 68 kg   SpO2 100%   BMI 22.81 kg/m   Physical Exam Vitals and nursing note reviewed.  Constitutional:      Appearance: He is well-developed.  HENT:     Head: Normocephalic and atraumatic.     Right Ear: Tympanic membrane normal.     Left Ear: Tympanic membrane normal.  Eyes:     Extraocular Movements: Extraocular movements intact.     Conjunctiva/sclera: Conjunctivae normal.     Pupils: Pupils are equal, round, and reactive to light.  Cardiovascular:     Rate and Rhythm: Normal rate and regular rhythm.     Heart sounds: Normal heart sounds.  Pulmonary:     Effort: Pulmonary effort is normal.     Breath sounds: Normal breath sounds. No wheezing.  Abdominal:     General: Bowel sounds are normal.     Palpations: Abdomen is soft.     Tenderness: There is no abdominal tenderness.  Musculoskeletal:        General: Normal range of motion.     Cervical back: Normal range of motion and neck supple.  Lymphadenopathy:     Cervical: No cervical adenopathy.  Skin:    General: Skin is warm and dry.     Findings: No rash.  Neurological:     General: No focal deficit present.     Mental Status: He is alert  and oriented to person, place, and time.     GCS: GCS eye subscore is 4. GCS verbal subscore is 5. GCS motor subscore is 6.     Cranial Nerves: No cranial nerve deficit.     Sensory: No sensory deficit.     Coordination: Coordination normal.     Gait: Gait normal.     Deep Tendon Reflexes: Reflexes normal.     Comments: normal rapid alternating movements. Cranial nerves III-XII intact.  Equal grip strength.  Psychiatric:        Speech: Speech normal.        Behavior: Behavior normal.        Thought Content: Thought content normal.    ED Results / Procedures / Treatments   Labs (all labs ordered are listed, but only abnormal results are displayed) Labs Reviewed  MAGNESIUM    EKG EKG Interpretation  Date/Time:  Wednesday December 17 2020 16:13:34 EDT Ventricular Rate:  67 PR Interval:  172 QRS Duration: 94 QT Interval:  450 QTC Calculation: 475 R Axis:   -39 Text Interpretation: Normal sinus rhythm Left axis deviation Abnormal ECG since last tracing no significant change Confirmed by Noemi Chapel [id number]) on 12/09/2020 4:23:52 PM  Radiology CT Head Wo Contrast  Result Date: 11/29/2020 CLINICAL DATA:  Headache, new or worsening (Age >= 50y) EXAM: CT HEAD WITHOUT CONTRAST TECHNIQUE: Contiguous axial images were obtained from the base of the skull through the vertex without intravenous contrast. COMPARISON:  10/10/2020 FINDINGS: Brain: There is atrophy and chronic small vessel disease changes. No acute intracranial abnormality. Specifically, no hemorrhage, hydrocephalus, mass lesion, acute infarction, or significant intracranial injury. Vascular: No hyperdense vessel or unexpected calcification. Skull: No acute calvarial abnormality. Sinuses/Orbits: No acute findings Other: None IMPRESSION: Atrophy, chronic microvascular disease. No acute intracranial abnormality. Electronically Signed   By: Rolm Baptise M.D.   On: 12/23/2020 18:04    Procedures Procedures   Medications Ordered in  ED Medications  labetalol (NORMODYNE)  injection 20 mg (has no administration in time range)  potassium chloride 10 mEq in 100 mL IVPB (10 mEq Intravenous New Bag/Given 12/09/2020 1829)  potassium chloride SA (KLOR-CON) CR tablet 40 mEq (40 mEq Oral Given 12/21/2020 1823)  hydrALAZINE (APRESOLINE) tablet 10 mg (10 mg Oral Given 12/14/2020 1823)  magnesium sulfate IVPB 2 g 50 mL (2 g Intravenous New Bag/Given 12/08/2020 1839)  labetalol (NORMODYNE) injection 10 mg (10 mg Intravenous Given 12/26/2020 1836)    ED Course  I have reviewed the triage vital signs and the nursing notes.  Pertinent labs & imaging results that were available during my care of the patient were reviewed by me and considered in my medical decision making (see chart for details).    MDM Rules/Calculators/A&P                           Patient presenting from Dr. Tomie China office, hypokalemia 2.6, headache and elevated blood pressure.  He has not been taking his lisinopril, was given a dose prior to arrival here in Dr. Marthann Schiller office.  He has had head CT scan here which is negative for acute intracranial hemorrhage.  He was given both IV and oral potassium, also ordered a dose of hydralazine, unfortunately it was ordered as p.o. rather than IV.  When this was discovered I added an additional IV dose of labetalol 10 mg.  At blood pressure recheck his blood pressure continues to be elevated, most recently 231/101, however his headache is improved at this time but not completely resolved.  He will need additional treatment of this blood pressure, also will need a repeat potassium to confirm that his potassium level is normal range prior to discharge home.  Discussed with Will Ileene Patrick, PA-C who assumes patient care. Final Clinical Impression(s) / ED Diagnoses Final diagnoses:  Hypertensive urgency  Hypokalemia    Rx / DC Orders ED Discharge Orders     None        Landis Martins 12/03/2020 1943    Noemi Chapel,  MD 11/30/2020 2048

## 2020-12-17 NOTE — ED Notes (Signed)
Bladder scan completed with 149ml  noted

## 2020-12-17 NOTE — ED Triage Notes (Signed)
Pt sent down from cancer center today for HTN & low K+, pt only complaint is a bad headache

## 2020-12-17 NOTE — H&P (Signed)
History and Physical    Keith Olson. WRU:[id number] DOB: 07-03-1940 DOA: 12/12/2020  PCP: Dettinger, Fransisca Kaufmann, MD   Patient coming from: Home  I have personally briefly reviewed patient's old medical records in [place]  Chief Complaint: Elevated blood pressure  HPI: Keith Olson. is a 80 y.o. male with medical history significant for metastatic prostate cancer, hypertension, depression. Patient presented to the ED with complaints of severe headache of 3 days duration.  No chest pain or difficulty breathing, no change in vision, no weakness of his extremities.  With intermittent episodes of vomiting over the past 3 days.  He denies abdominal pain, but feels his abdomen is a bit bloated.  Last bowel movement was yesterday and was normal.  He went to see his oncologist today Dr. Delton Coombes, blood pressure was elevated, and his potassium was low, he was referred to the ED. Reports compliance with his lisinopril.  ED Course: Temperature 97.8.  Heart rate 53- 72.  Pressure systolic elevated up to 914 to 782 systolic.  He was given hydralazine 10 mg, and then 20 mg, labetalol 10 mg and then 20 mg without initial improvement in blood pressure.  1.25 mg of enalaprilat given.  Head CT without acute abnormality. Hospitalist was called to admit for hypertensive urgency, and hypokalemia.  Review of Systems: As per HPI all other systems reviewed and negative.  Past Medical History:  Diagnosis Date   Agent orange exposure    Arthritis    Coronary artery disease    BMS to mid LAD 2001, DES to proximal LAD 2017   Enlarged prostate    XRT 2016   Headache    Sinus headaches    Heart abnormality    History of depression    Hyperlipidemia    Hypothyroidism    Prostate cancer ([place]) 07/2014   hormonal therapy, external beam radiation therapy   PTSD (post-traumatic stress disorder)     Past Surgical History:  Procedure Laterality Date   BRONCHIAL BRUSHINGS  07/02/2019   Procedure:  BRONCHIAL BRUSHINGS;  Surgeon: Rigoberto Noel, MD;  [facility]: [facility];  Service: Cardiopulmonary;;   BRONCHIAL NEEDLE ASPIRATION BIOPSY  07/02/2019   Procedure: BRONCHIAL NEEDLE ASPIRATION BIOPSIES;  Surgeon: Rigoberto Noel, MD;  [facility];  Service: Cardiopulmonary;;   BRONCHIAL WASHINGS  07/02/2019   Procedure: BRONCHIAL WASHINGS;  Surgeon: Rigoberto Noel, MD;  [facility];  Service: Cardiopulmonary;;   CARDIAC CATHETERIZATION N/A 12/12/2015   Procedure: Left Heart Cath and Coronary Angiography;  Surgeon: Peter M Martinique, MD;  Location: [facility];  Service: Cardiovascular;  Laterality: N/A;   CARDIAC CATHETERIZATION N/A 12/12/2015   Procedure: Coronary Stent Intervention;  Surgeon: Peter M Martinique, MD;  Location: [facility];  Service: Cardiovascular;  Laterality: N/A;   CORONARY STENT INTERVENTION N/A 09/06/2017   Procedure: CORONARY STENT INTERVENTION;  Surgeon: Burnell Blanks, MD;  Location: [facility];  Service: Cardiovascular;  Laterality: N/A;   ENDOBRONCHIAL ULTRASOUND N/A 07/02/2019   Procedure: ENDOBRONCHIAL ULTRASOUND;  Surgeon: Rigoberto Noel, MD;  [facility];  Service: Cardiopulmonary;  Laterality: N/A;   FLEXIBLE BRONCHOSCOPY  07/02/2019   Procedure: FLEXIBLE BRONCHOSCOPY;  Surgeon: Rigoberto Noel, MD;  [facility];  Service: Cardiopulmonary;;   HERNIA REPAIR Right    LEFT HEART CATH AND CORONARY ANGIOGRAPHY N/A 09/02/2016   Procedure: Left Heart Cath and Coronary Angiography;  Surgeon: Leonie Man, MD;  Location: [facility];  Service: Cardiovascular;  Laterality: N/A;   LEFT HEART CATH AND CORONARY ANGIOGRAPHY N/A 09/06/2017   Procedure: LEFT HEART CATH AND CORONARY ANGIOGRAPHY;  Surgeon: Burnell Blanks, MD;  Location: [facility];  Service: Cardiovascular;  Laterality: N/A;   LEFT HEART CATH AND CORONARY ANGIOGRAPHY N/A 05/04/2019   Procedure: LEFT HEART CATH AND CORONARY ANGIOGRAPHY;   Surgeon: Martinique, Peter M, MD;  Location: [facility];  Service: Cardiovascular;  Laterality: N/A;   PROSTATE BIOPSY N/A 08/28/2014   Procedure: BIOPSY TRANSRECTAL ULTRASONIC PROSTATE (TUBP);  Surgeon: Rana Snare, MD;  [facility];  Service: Urology;  Laterality: N/A;   TEE WITHOUT CARDIOVERSION N/A 04/08/2020   Procedure: TRANSESOPHAGEAL ECHOCARDIOGRAM (TEE) WITH PROPOFOL;  Surgeon: Arnoldo Lenis, MD;  [facility];  Service: Endoscopy;  Laterality: N/A;   TRANSURETHRAL RESECTION OF PROSTATE N/A 08/28/2014   Procedure: TRANSURETHRAL RESECTION OF THE PROSTATE WITH GYRUS INSTRUMENTS;  Surgeon: Rana Snare, MD;  [facility];  Service: Urology;  Laterality: N/A;     reports that he has never smoked. He has quit using smokeless tobacco.  His smokeless tobacco use included chew. He reports that he does not drink alcohol and does not use drugs.  Allergies  Allergen Reactions   Lipitor [Atorvastatin]     Muscles aches    Family History  Problem Relation Age of Onset   Arthritis Mother    Heart attack Father    Prior to Admission medications   Medication Sig Start Date End Date Taking? Authorizing Provider  abiraterone acetate (ZYTIGA) 250 MG tablet TAKE 4 TABLETS (1,000 MG TOTAL) BY MOUTH DAILY. TAKE ON AN EMPTY STOMACH 1 HOUR BEFORE OR 2 HOURS AFTER A MEAL 06/10/20 06/10/21 Yes Derek Jack, MD  ALPRAZolam Duanne Moron) 0.5 MG tablet Take 1 tablet (0.5 mg total) by mouth 2 (two) times daily as needed for anxiety. 07/25/19  Yes Johnson, Clanford L, MD  aspirin EC 81 MG tablet Take 1 tablet (81 mg total) by mouth daily. 12/09/15  Yes Satira Sark, MD  calcium-vitamin D (OSCAL 500/200 D-3) 500-200 MG-UNIT tablet Take 1 tablet by mouth 2 (two) times daily. 10/13/20 10/13/21 Yes Pokhrel, Laxman, MD  fentaNYL (DURAGESIC) 25 MCG/HR Place 1 patch onto the skin every 3 (three) days. 12/12/20  Yes Derek Jack, MD  gabapentin (NEURONTIN) 400 MG capsule TAKE 1  CAPSULE BY MOUTH THREE TIMES DAILY 10/21/20  Yes Derek Jack, MD  HYDROmorphone (DILAUDID) 4 MG tablet Take 1 tablet (4 mg total) by mouth every 8 (eight) hours as needed for severe pain. 11/24/20  Yes Derek Jack, MD  lisinopril (ZESTRIL) 10 MG tablet Take 1 tablet (10 mg total) by mouth daily. 10/22/20  Yes Derek Jack, MD  nitroGLYCERIN (NITROSTAT) 0.4 MG SL tablet Place 1 tablet (0.4 mg total) under the tongue every 5 (five) minutes as needed for chest pain. X 3 doses 09/07/17  Yes Bhagat, Bhavinkumar, PA  polyethylene glycol powder (GLYCOLAX/MIRALAX) 17 GM/SCOOP powder Take 17 g by mouth daily as needed for mild constipation or moderate constipation.    Yes [provider]  potassium chloride SA (KLOR-CON) 20 MEQ tablet Take 1 tablet (20 mEq total) by mouth daily. 10/22/20  Yes Derek Jack, MD  prochlorperazine (COMPAZINE) 10 MG tablet Take 1 tablet (10 mg total) by mouth every 6 (six) hours as needed for nausea or vomiting. 07/10/20  Yes Derek Jack, MD  traZODone (DESYREL) 50 MG tablet 1 tablet at bedtime as needed 10/09/20  Yes [provider]  ALPRAZolam (XANAX) 0.5 MG tablet 1 tablet as needed Patient not taking: Reported on 12/02/2020 11/27/20   [provider]  dronabinol (MARINOL) 2.5 MG capsule Take 1 capsule (2.5 mg total) by mouth 2 (two) times daily before a meal. Patient not taking: Reported on 12/04/2020 04/25/20   Derek Jack, MD  DULoxetine (CYMBALTA) 60 MG capsule Take 60 mg by mouth daily. Patient not taking: Reported on 12/16/2020 08/09/19   [provider]  fluticasone (FLONASE) 50 MCG/ACT nasal spray Place 2 sprays into both nostrils daily. Patient not taking: Reported on 12/01/2020 12/26/19   [provider]  lactulose (CHRONULAC) 10 GM/15ML solution Take 30 mLs (20 g total) by mouth every 3 (three) hours as needed for mild constipation (take 8m po every 3 hrs until BM). Patient not  taking: Reported on 12/11/2020 07/02/20   KDerek Jack MD  linaclotide (Rolan Lipa 145 MCG CAPS capsule Take 145 mcg by mouth daily before breakfast. Patient not taking: Reported on 12/20/2020 07/02/20   [provider]  NARCAN 4 MG/0.1ML LIQD nasal spray kit Place 1 spray into the nose once. Patient not taking: Reported on 12/07/2020 08/10/19   [provider]  predniSONE (DELTASONE) 5 MG tablet Take 1 tablet (5 mg total) by mouth 2 (two) times daily with a meal. Patient not taking: Reported on 11/30/2020 12/09/2020   KDerek Jack MD    Physical Exam: Vitals:   12/26/2020 1759 12/02/2020 1800 12/07/2020 1922 12/11/2020 2051  BP: (!) 224/102 (!) 203/107 (!) 231/101 (!) 214/98  Pulse: 65 62 64   Resp: 19 20 (!) 29   Temp:      TempSrc:      SpO2: 98% 98% 100%   Weight:      Height:        Constitutional: NAD, calm, comfortable Vitals:   12/06/2020 1759 11/30/2020 1800 12/02/2020 1922 12/06/2020 2051  BP: (!) 224/102 (!) 203/107 (!) 231/101 (!) 214/98  Pulse: 65 62 64   Resp: 19 20 (!) 29   Temp:      TempSrc:      SpO2: 98% 98% 100%   Weight:      Height:       Eyes: PERRL, lids and conjunctivae normal ENMT: Mucous membranes are moist..  Neck: normal, supple, no masses, no thyromegaly Respiratory: clear to auscultation bilaterally, no wheezing, no crackles. Normal respiratory effort. No accessory muscle use.  Cardiovascular: Regular rate and rhythm, no murmurs / rubs / gallops. No extremity edema. 2+ pedal pulses. Abdomen: no tenderness, no masses palpated. No hepatosplenomegaly. Bowel sounds positive.  Musculoskeletal: no clubbing / cyanosis. No joint deformity upper and lower extremities. Good ROM, no contractures. Normal muscle tone.  Skin: no rashes, lesions, ulcers. No induration Neurologic: Slight flattening of left nasolabial fold, but patient reports its because of a small pimple there- and I do palpate one present. Psychiatric: Response to questions  slightly delayed, and he sometimes repeats the question, he is otherwise alert and oriented to person place time and situation.  Labs on Admission: I have personally reviewed following labs and imaging studies  CBC: Recent Labs  Lab 12/09/2020 1429  WBC 7.0  NEUTROABS 3.9  HGB 14.8  HCT 43.6  MCV 90.8  PLT 2086  Basic Metabolic Panel: Recent Labs  Lab 12/04/2020 1429  NA 133*  K 2.6*  CL 93*  CO2 29  GLUCOSE 115*  BUN 12  CREATININE 0.55*  CALCIUM 9.1  MG 2.4   GFR:  Estimated Creatinine Clearance: 73.2 mL/min (A) (by C-G formula based on SCr of 0.55 mg/dL (L)). Liver Function Tests: Recent Labs  Lab 11/29/2020 1429  AST 23  ALT 23  ALKPHOS 72  BILITOT 0.5  PROT 7.5  ALBUMIN 4.3    Radiological Exams on Admission: CT Head Wo Contrast  Result Date: 12/18/2020 CLINICAL DATA:  Headache, new or worsening (Age >= 50y) EXAM: CT HEAD WITHOUT CONTRAST TECHNIQUE: Contiguous axial images were obtained from the base of the skull through the vertex without intravenous contrast. COMPARISON:  10/10/2020 FINDINGS: Brain: There is atrophy and chronic small vessel disease changes. No acute intracranial abnormality. Specifically, no hemorrhage, hydrocephalus, mass lesion, acute infarction, or significant intracranial injury. Vascular: No hyperdense vessel or unexpected calcification. Skull: No acute calvarial abnormality. Sinuses/Orbits: No acute findings Other: None IMPRESSION: Atrophy, chronic microvascular disease. No acute intracranial abnormality. Electronically Signed   By: Rolm Baptise M.D.   On: 12/09/2020 18:04    EKG: Independently reviewed.  Sinus rhythm rate 67.  QTc 475.  No significant change from prior.  Assessment/Plan Principal Problem:   Hypertensive urgency Active Problems:   Prostate cancer metastatic to bone [facility])   CAD (coronary artery disease)   Depression, major, single episode, moderate (HCC)   Hypothyroidism   Hypokalemia   Hypertensive urgency-  Blood  Pressure systolic elevated up to 062 to 694 systolic.  With headache, vomiting.  Head CT unremarkable.  No chest pain neurologic deficits at this time. -No initial response to 10 and 20 mg of Iv hydralazine and IV labetalol.  1.25 mg of IV enalaprilat was also given. -Will start nicardipine drip if blood pressure is persistently above 854 systolic (goal blood pressure 160-170) -Medication list has only low-dose lisinopril 10 mg. -Resume lisinopril at increased dose 20 mg daily -Start Norvasc 5 mg daily  Hypokalemia- 2.6.  Potassium 2.4.  Likely from vomiting. -Replete - N/s + 40 KCL 75xx/hr x 12hrs  Metastatic prostate cancer to bone-follows with Dr. Delton Coombes.  Diagnosed 2016.  Status post chemotherapy and seed implants.  He is on Zytiga.  He is reporting no abdominal pain when he tries to void, pain is preventing him from voiding. -He is on both fentanyl patch and oral Dilaudid, resume -Bedside bladder scan -Obtain UA  Depression -  -Stable.  -Resume Xanax 0.5 nightly,    coronary artery disease- stable without chest pain.  EKG unremarkable. -Resume aspirin  DVT prophylaxis: Lovenox Code Status: DNR, confirmed with patient at bedside and consistent with prior documentation in chart. Family Communication: None at bedside Disposition Plan: ~ 2 days Consults called:  None Admission status: Inpt, Stepdown I certify that at the point of admission it is my clinical judgment that the patient will require inpatient hospital care spanning beyond 2 midnights from the point of admission due to high intensity of service, high risk for further deterioration and high frequency of surveillance required.  Bethena Roys MD [facility]  12/11/2020, 9:20 PM

## 2020-12-17 NOTE — Patient Instructions (Addendum)
[facility]  Discharge Instructions: Thank you for choosing [facility] to provide your oncology and hematology care.  If you have a lab appointment with the Cancer Center, please come in thru the Main Entrance and check in at the main information desk.  Wear comfortable clothing and clothing appropriate for easy access to any Portacath or PICC line.   We strive to give you quality time with your provider. You may need to reschedule your appointment if you arrive late (15 or more minutes).  Arriving late affects you and other patients whose appointments are after yours.  Also, if you miss three or more appointments without notifying the office, you may be dismissed from the clinic at the provider's discretion.      For prescription refill requests, have your pharmacy contact our office and allow 72 hours for refills to be completed.        To help prevent nausea and vomiting after your treatment, we encourage you to take your nausea medication as directed.  BELOW ARE SYMPTOMS THAT SHOULD BE REPORTED IMMEDIATELY: *FEVER GREATER THAN 100.4 F (38 C) OR HIGHER *CHILLS OR SWEATING *NAUSEA AND VOMITING THAT IS NOT CONTROLLED WITH YOUR NAUSEA MEDICATION *UNUSUAL SHORTNESS OF BREATH *UNUSUAL BRUISING OR BLEEDING *URINARY PROBLEMS (pain or burning when urinating, or frequent urination) *BOWEL PROBLEMS (unusual diarrhea, constipation, pain near the anus) TENDERNESS IN MOUTH AND THROAT WITH OR WITHOUT PRESENCE OF ULCERS (sore throat, sores in mouth, or a toothache) UNUSUAL RASH, SWELLING OR PAIN  UNUSUAL VAGINAL DISCHARGE OR ITCHING   Items with * indicate a potential emergency and should be followed up as soon as possible or go to the Emergency Department if any problems should occur.  Please show the CHEMOTHERAPY ALERT CARD or IMMUNOTHERAPY ALERT CARD at check-in to the Emergency Department and triage nurse.  Should you have questions after your visit or need to cancel  or reschedule your appointment, please contact [facility] [phone]  and follow the prompts.  Office hours are 8:00 a.m. to 4:30 p.m. Monday - Friday. Please note that voicemails left after 4:00 p.m. may not be returned until the following business day.  We are closed weekends and major holidays. You have access to a nurse at all times for urgent questions. Please call the main number to the clinic [phone] and follow the prompts.  For any non-urgent questions, you may also contact your provider using MyChart. We now offer e-Visits for anyone 18 and older to request care online for non-urgent symptoms. For details visit [email or web address].   Also download the MyChart app! Go to the app store, search "MyChart", open the app, select , and log in with your MyChart username and password.  Due to Covid, a mask is required upon entering the hospital/clinic. If you do not have a mask, one will be given to you upon arrival. For doctor visits, patients may have 1 support person aged 18 or older with them. For treatment visits, patients cannot have anyone with them due to current Covid guidelines and our immunocompromised population.  

## 2020-12-17 NOTE — Patient Instructions (Addendum)
[place] at [facility] Discharge Instructions  You were seen and examined today by Dr. Delton Coombes. He wants you to be sure to take your lisinopril every day because your blood pressure is too high. This is more than likely causing your headache. He also wants you to increase the prednisone to twice daily. Please keep follow up as scheduled.   Thank you for choosing [place] at [facility] to provide your oncology and hematology care.  To afford each patient quality time with our provider, please arrive at least 15 minutes before your scheduled appointment time.   If you have a lab appointment with the [facility] please come in thru the Main Entrance and check in at the main information desk.  You need to re-schedule your appointment should you arrive 10 or more minutes late.  We strive to give you quality time with our providers, and arriving late affects you and other patients whose appointments are after yours.  Also, if you no show three or more times for appointments you may be dismissed from the clinic at the providers discretion.     Again, thank you for choosing [facility].  Our hope is that these requests will decrease the amount of time that you wait before being seen by our physicians.       _____________________________________________________________  Should you have questions after your visit to [facility], please contact our office at [phone] and follow the prompts.  Our office hours are 8:00 a.m. and 4:30 p.m. Monday - Friday.  Please note that voicemails left after 4:00 p.m. may not be returned until the following business day.  We are closed weekends and major holidays.  You do have access to a nurse 24-7, just call the main number to the clinic [phone] and do not press any options, hold on the line and a nurse will answer the phone.    For prescription refill requests, have your pharmacy  contact our office and allow 72 hours.    Due to Covid, you will need to wear a mask upon entering the hospital. If you do not have a mask, a mask will be given to you at the Main Entrance upon arrival. For doctor visits, patients may have 1 support person age 35 or older with them. For treatment visits, patients can not have anyone with them due to social distancing guidelines and our immunocompromised population.

## 2020-12-17 NOTE — Progress Notes (Signed)
Patient presents today for Xgeva injection per providers order.  Patient taking Calcium and vitamin D supplements, no jaw pain and no recent or up coming dental work.    Patient not receiving Delton See today and is being sent to the Emergency Department for BP of 201/110 and a critical Potassium of 2.6.

## 2020-12-17 NOTE — ED Provider Notes (Signed)
Patient was received at shift change from Evalee Jefferson, PA-C she provided HPI, current work-up, likely disposition please see her note for full detail.  In short patient with significant medical history including CAD, hyperlipidemia, prostate cancer currently receiving chemotherapy under the care of Dr. Delton Coombes presents to the emergency department with chief complaint of headache x3 days and hypertension.  Patient went to his follow-up appointment with Dr. Delton Coombes noted that his systolic was in the 222L and had a potassium of 2.6. He  was sent here for further evaluation, per Dr. Delton Coombes chemotherapy that patient is on can increase his blood pressure and lower his potassium.  Patient does not endorse any change in vision, paresthesias or weakness upper lower extremities, denies recent head trauma, currently not on anticoagulant.  He endorses that he has a headache that is all around his head, has been constant, denies any leaving driving factors, states that this feels like his normal headache but he is never had it for this long.   Current work-up reveals a magnesium of 2.4, CT head negative for acute findings, CBC and CMP which was done in the office which  showing potassium 2.6,  Previous provider provide the patient with 40 mEq of potassium p.o., as well as 1 round of IV potassium, magnesium was also supplemented, attempt to lower blood pressure with hydralazine and a dose of labetalol without much relief.  Plan was to stabilize blood pressure and repeat potassium if unable to control both patient will have admitted for further evaluation. Physical Exam  BP (!) 177/83   Pulse (!) 57   Temp 97.8 F (36.6 C) (Oral)   Resp 19   Ht 5\' 8"  (1.727 m)   Wt 68 kg   SpO2 96%   BMI 22.81 kg/m   Physical Exam Vitals and nursing note reviewed.  Constitutional:      General: He is not in acute distress.    Appearance: He is not ill-appearing.  HENT:     Head: Normocephalic and atraumatic.      Nose: No congestion.  Eyes:     Conjunctiva/sclera: Conjunctivae normal.  Cardiovascular:     Rate and Rhythm: Normal rate and regular rhythm.     Pulses: Normal pulses.     Heart sounds: No murmur heard.   No friction rub. No gallop.  Pulmonary:     Effort: No respiratory distress.     Breath sounds: No wheezing, rhonchi or rales.  Skin:    General: Skin is warm and dry.  Neurological:     Mental Status: He is alert.     Comments: No facial asymmetry, no difficult word finding, no slurring of his words, able to follow two-step commands, no unilateral weakness present my exam.  Psychiatric:        Mood and Affect: Mood normal.    ED Course/Procedures     Procedures  MDM  Initial impression-patient presents with headaches, hypertension, and hypokalemia.  He is alert, does not appear acute stress, vital signs normal for BP the [place] of 233/107.  Will provide patient with additional dose of labetalol and reassess.  Work-up-CBC shows leukocytosis 12.3, BMP shows potassium of 2.5, glucose 133, creatinine 0.56.  Reassessment-patient is reassessed after second dose of labetalol, blood pressure did not improve, will provide patient with 20 mg of hydralazine, vasotec recommend admission due to hypertensive urgency.  Patient was agreement this plan.  Consult- Dr. Su Ley will admit the patient.  Rule out-low suspicion for intracranial head bleed  or mass as CT imaging negative for this finding.  Low suspicion for CVA is no focal deficits present on exam.  Low suspicion for dissection of the vertebral/carotid arteries as presentation is atypical of etiology.  I have low suspicion for hypertensive emergency as there is no noted organ damage.  I suspicion for hypertensive CHF exacerbation patient denies chest pain, shortness of breath, no peripheral edema present.    Plan-admit patient for hypokalemia as well as hypertensive urgency.       Marcello Fennel, PA-C 12/27/2020 2229     Noemi Chapel, MD 12/18/20 [phone]

## 2020-12-18 ENCOUNTER — Inpatient Hospital Stay (HOSPITAL_COMMUNITY): Payer: Medicare Other

## 2020-12-18 ENCOUNTER — Other Ambulatory Visit (HOSPITAL_COMMUNITY): Payer: Self-pay

## 2020-12-18 DIAGNOSIS — C61 Malignant neoplasm of prostate: Secondary | ICD-10-CM

## 2020-12-18 DIAGNOSIS — I160 Hypertensive urgency: Secondary | ICD-10-CM | POA: Diagnosis not present

## 2020-12-18 LAB — CBC
HCT: 42.3 % (ref 39.0–52.0)
Hemoglobin: 14.4 g/dL (ref 13.0–17.0)
MCH: 30.4 pg (ref 26.0–34.0)
MCHC: 34 g/dL (ref 30.0–36.0)
MCV: 89.4 fL (ref 80.0–100.0)
Platelets: 270 10*3/uL (ref 150–400)
RBC: 4.73 MIL/uL (ref 4.22–5.81)
RDW: 13.2 % (ref 11.5–15.5)
WBC: 10.5 10*3/uL (ref 4.0–10.5)
nRBC: 0 % (ref 0.0–0.2)

## 2020-12-18 LAB — RENAL FUNCTION PANEL
Albumin: 4.2 g/dL (ref 3.5–5.0)
Anion gap: 10 (ref 5–15)
BUN: 10 mg/dL (ref 8–23)
CO2: 25 mmol/L (ref 22–32)
Calcium: 8.7 mg/dL — ABNORMAL LOW (ref 8.9–10.3)
Chloride: 106 mmol/L (ref 98–111)
Creatinine, Ser: 0.53 mg/dL — ABNORMAL LOW (ref 0.61–1.24)
GFR, Estimated: >60 mL/min (ref >60)
Glucose, Bld: 126 mg/dL — ABNORMAL HIGH (ref 70–99)
Phosphorus: 2.4 mg/dL — ABNORMAL LOW (ref 2.5–4.6)
Potassium: 2.6 mmol/L — CL (ref 3.5–5.1)
Sodium: 141 mmol/L (ref 135–145)

## 2020-12-18 LAB — URINALYSIS, ROUTINE W REFLEX MICROSCOPIC
Bilirubin Urine: NEGATIVE
Glucose, UA: NEGATIVE mg/dL
Hgb urine dipstick: NEGATIVE
Ketones, ur: NEGATIVE mg/dL
Leukocytes,Ua: NEGATIVE
Nitrite: NEGATIVE
Protein, ur: NEGATIVE mg/dL
Specific Gravity, Urine: 1.004 — ABNORMAL LOW (ref 1.005–1.030)
pH: 8 (ref 5.0–8.0)

## 2020-12-18 LAB — BASIC METABOLIC PANEL
Anion gap: 9 (ref 5–15)
BUN: 9 mg/dL (ref 8–23)
CO2: 27 mmol/L (ref 22–32)
Calcium: 8.8 mg/dL — ABNORMAL LOW (ref 8.9–10.3)
Chloride: 105 mmol/L (ref 98–111)
Creatinine, Ser: 0.51 mg/dL — ABNORMAL LOW (ref 0.61–1.24)
GFR, Estimated: >60 mL/min (ref >60)
Glucose, Bld: 131 mg/dL — ABNORMAL HIGH (ref 70–99)
Potassium: 2.4 mmol/L — CL (ref 3.5–5.1)
Sodium: 141 mmol/L (ref 135–145)

## 2020-12-18 LAB — MRSA NEXT GEN BY PCR, NASAL: MRSA by PCR Next Gen: NOT DETECTED

## 2020-12-18 LAB — MAGNESIUM: Magnesium: 2.5 mg/dL — ABNORMAL HIGH (ref 1.7–2.4)

## 2020-12-18 MED ORDER — ABIRATERONE ACETATE 250 MG PO TABS
1000.0000 mg | ORAL_TABLET | Freq: Every day | ORAL | Status: DC
Start: 2020-12-18 — End: 2020-12-19

## 2020-12-18 MED ORDER — ONDANSETRON HCL 4 MG/2ML IJ SOLN
4.0000 mg | Freq: Four times a day (QID) | INTRAMUSCULAR | Status: DC | PRN
  Administered 2020-12-18: 4 mg via INTRAVENOUS
  Filled 2020-12-18: qty 2

## 2020-12-18 MED ORDER — FENTANYL 25 MCG/HR TD PT72
1.0000 | MEDICATED_PATCH | TRANSDERMAL | Status: DC
Start: 2020-12-20 — End: 2020-12-19

## 2020-12-18 MED ORDER — SODIUM CHLORIDE 0.9 % IV SOLN: INTRAVENOUS | Status: DC

## 2020-12-18 MED ORDER — AMLODIPINE BESYLATE 5 MG PO TABS
5.0000 mg | ORAL_TABLET | Freq: Once | ORAL | Status: AC
  Administered 2020-12-18: 5 mg via ORAL
  Filled 2020-12-18: qty 1

## 2020-12-18 MED ORDER — ACETAMINOPHEN 325 MG PO TABS
650.0000 mg | ORAL_TABLET | Freq: Four times a day (QID) | ORAL | Status: DC | PRN
  Administered 2020-12-18 (×2): 650 mg via ORAL
  Filled 2020-12-18 (×2): qty 2

## 2020-12-18 MED ORDER — ENOXAPARIN SODIUM 40 MG/0.4ML IJ SOSY
40.0000 mg | PREFILLED_SYRINGE | INTRAMUSCULAR | Status: DC
  Administered 2020-12-18 – 2020-12-19 (×2): 40 mg via SUBCUTANEOUS
  Filled 2020-12-18 (×2): qty 0.4

## 2020-12-18 MED ORDER — BISACODYL 10 MG RE SUPP
10.0000 mg | Freq: Every day | RECTAL | Status: DC
  Administered 2020-12-18: 10 mg via RECTAL
  Filled 2020-12-18: qty 1

## 2020-12-18 MED ORDER — POTASSIUM CHLORIDE 10 MEQ/100ML IV SOLN
10.0000 meq | INTRAVENOUS | Status: AC
  Administered 2020-12-18 (×3): 10 meq via INTRAVENOUS
  Filled 2020-12-18 (×3): qty 100

## 2020-12-18 MED ORDER — POTASSIUM CHLORIDE CRYS ER 20 MEQ PO TBCR
20.0000 meq | EXTENDED_RELEASE_TABLET | Freq: Every day | ORAL | Status: DC
  Administered 2020-12-18: 20 meq via ORAL
  Filled 2020-12-18: qty 1

## 2020-12-18 MED ORDER — ALPRAZOLAM 0.5 MG PO TABS
0.5000 mg | ORAL_TABLET | Freq: Every day | ORAL | Status: DC
  Administered 2020-12-18: 0.5 mg via ORAL
  Filled 2020-12-18: qty 1

## 2020-12-18 MED ORDER — POLYETHYLENE GLYCOL 3350 17 G PO PACK
17.0000 g | PACK | Freq: Two times a day (BID) | ORAL | Status: DC
  Administered 2020-12-18 (×2): 17 g via ORAL
  Filled 2020-12-18 (×2): qty 1

## 2020-12-18 MED ORDER — ASPIRIN EC 81 MG PO TBEC
81.0000 mg | DELAYED_RELEASE_TABLET | Freq: Every day | ORAL | Status: DC
  Administered 2020-12-18: 81 mg via ORAL
  Filled 2020-12-18: qty 1

## 2020-12-18 MED ORDER — ACETAMINOPHEN 650 MG RE SUPP: 650.0000 mg | Freq: Four times a day (QID) | RECTAL | Status: DC | PRN

## 2020-12-18 MED ORDER — AMLODIPINE BESYLATE 5 MG PO TABS: 10.0000 mg | ORAL_TABLET | Freq: Every day | ORAL | Status: DC

## 2020-12-18 MED ORDER — HYDRALAZINE HCL 25 MG PO TABS
50.0000 mg | ORAL_TABLET | Freq: Three times a day (TID) | ORAL | Status: DC
  Administered 2020-12-18: 50 mg via ORAL
  Filled 2020-12-18: qty 2

## 2020-12-18 MED ORDER — POTASSIUM CHLORIDE CRYS ER 20 MEQ PO TBCR
40.0000 meq | EXTENDED_RELEASE_TABLET | Freq: Once | ORAL | Status: AC
  Administered 2020-12-18: 40 meq via ORAL
  Filled 2020-12-18: qty 2

## 2020-12-18 MED ORDER — HYDROMORPHONE HCL 4 MG PO TABS
4.0000 mg | ORAL_TABLET | Freq: Three times a day (TID) | ORAL | Status: DC | PRN
  Administered 2020-12-18 (×3): 4 mg via ORAL
  Filled 2020-12-18 (×3): qty 1

## 2020-12-18 MED ORDER — NICARDIPINE HCL IN NACL 20-0.86 MG/200ML-% IV SOLN
3.0000 mg/h | INTRAVENOUS | Status: DC
  Administered 2020-12-18: 5 mg/h via INTRAVENOUS
  Filled 2020-12-18: qty 200
  Filled 2020-12-18: qty 400
  Filled 2020-12-18: qty 200

## 2020-12-18 MED ORDER — POLYETHYLENE GLYCOL 3350 17 G PO PACK
17.0000 g | PACK | Freq: Every day | ORAL | Status: DC
  Administered 2020-12-18: 17 g via ORAL
  Filled 2020-12-18: qty 1

## 2020-12-18 MED ORDER — LABETALOL HCL 5 MG/ML IV SOLN
10.0000 mg | INTRAVENOUS | Status: DC | PRN
  Administered 2020-12-18: 10 mg via INTRAVENOUS
  Filled 2020-12-18: qty 4

## 2020-12-18 MED ORDER — ONDANSETRON HCL 4 MG PO TABS
4.0000 mg | ORAL_TABLET | Freq: Four times a day (QID) | ORAL | Status: DC | PRN
  Administered 2020-12-18: 4 mg via ORAL
  Filled 2020-12-18: qty 1

## 2020-12-18 MED ORDER — CHLORHEXIDINE GLUCONATE CLOTH 2 % EX PADS
6.0000 | MEDICATED_PAD | Freq: Every day | CUTANEOUS | Status: DC
  Administered 2020-12-19: 6 via TOPICAL

## 2020-12-18 NOTE — Progress Notes (Signed)
Date and time results received: 12/18/20 1245  Test: potassium Critical Value: 2.6  Name of Provider Notified: Dr. Joesph Fillers  Orders Received? Or Actions Taken?: See Chart

## 2020-12-18 NOTE — Progress Notes (Signed)
Date and time results received: 12/18/20 "5:37 AM" (use smartphrase ".now" to insert current time)  Test: Potassium Critical Value: 2.4  Name of Provider Notified: Hanley Ben MD  Orders Received? Or Actions Taken?: awaiting orders

## 2020-12-18 NOTE — TOC Initial Note (Signed)
Transition of Care (TOC) - Initial/Assessment Note    Patient Details  Name: Keith Olson. MRN: [id number] Date of Birth: [birth year]  Transition of Care [facility]) CM/SW Contact:    Boneta Lucks, RN Phone Number: 12/18/2020, 3:37 PM  Clinical Narrative:       Patient admitted with hypertensive urgency. MD is recommending HHPT, patient is agreeable, no preferences.   Penni Homans with [facility] accepted the referral. MD asked MD for orders. TOC to follow.           Expected Discharge Plan: [place] Barriers to Discharge: Continued Medical Work up   Patient Goals and CMS Choice Patient states their goals for this hospitalization and ongoing recovery are:: to go home. CMS Medicare.gov Compare Post Acute Care list provided to:: Patient Choice offered to / list presented to : Patient  Expected Discharge Plan and Services Expected Discharge Plan: [place]      Living arrangements for the past 2 months: [place] Agency: [place] Date [place]: 12/18/20 Time [place]: [employer] Representative spoke with at [place]: Penni Homans  Prior Living Arrangements/Services Living arrangements for the past 2 months: [place] Lives with:: Spouse Patient language and need for interpreter reviewed:: Yes Do you feel safe going back to the place where you live?: Yes      Need for Family Participation in Patient Care: Yes (Comment) Care giver support system in place?: Yes (comment)   Criminal Activity/Legal Involvement Pertinent to Current Situation/Hospitalization: No - Comment as needed  Activities of Daily Living Home Assistive Devices/Equipment: Wheelchair ADL Screening (condition at time of admission) Patient's cognitive ability adequate to safely complete daily activities?: Yes Is the patient deaf or have difficulty hearing?: No Does the patient have difficulty seeing, even when  wearing glasses/contacts?: No Does the patient have difficulty concentrating, remembering, or making decisions?: No Patient able to express need for assistance with ADLs?: Yes Does the patient have difficulty dressing or bathing?: No Independently performs ADLs?: Yes (appropriate for developmental age) Does the patient have difficulty walking or climbing stairs?: Yes Weakness of Legs: Both Weakness of Arms/Hands: Both  Permission Sought/Granted  Emotional Assessment     Affect (typically observed): Accepting Orientation: : Oriented to Place, Oriented to Self, Oriented to  Time, Oriented to Situation Alcohol / Substance Use: Not Applicable Psych Involvement: No (comment)  Admission diagnosis:  Hypokalemia [E87.6] Hypertensive urgency [I16.0] Patient Active Problem List   Diagnosis Date Noted   Hypertensive urgency 11/29/2020   Fall 10/10/2020   Hypokalemia 10/10/2020   Dysmetria 10/10/2020   Bacteremia due to Staphylococcus aureus 04/09/2020   Staphylococcus aureus bacteremia 04/07/2020   Community acquired pneumonia 04/04/2020   DNR (do not resuscitate) 04/04/2020   Acute bacterial sinusitis 04/04/2020   Leukocytosis 04/04/2020   Weakness 07/22/2019   Acute lower UTI 07/22/2019   Protein calorie malnutrition ([place]) 07/22/2019   Urinary retention 07/18/2019   Hyponatremia 06/29/2019   Acute bilateral low back pain with sciatica    Multiple lung nodules on CT    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[place]) 06/27/2019   Unstable angina ([place])    History of depression 09/05/2017   Hypothyroidism 09/05/2017   Depression, major, single episode, moderate ([place]) 11/26/2016   Prostatitis 11/26/2016   Rectal bleeding 02/25/2016   Pain  management contract signed 01/27/2016   Constipation 04/22/2015   Lower abdominal pain 04/22/2015   Anxiety state 12/16/2014   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   BPH with urinary obstruction  08/28/2014   Hyperlipidemia 05/22/2012   BPH (benign prostatic hyperplasia) 05/22/2012   Chest pain 02/24/2012   CAD (coronary artery disease) 02/24/2012   PCP:  Dettinger, Fransisca Kaufmann, MD Pharmacy:   [facility], [place] [address] Phone: [phone] Fax: [phone]   Readmission Risk Interventions Readmission Risk Prevention Plan 12/18/2020 04/06/2020 07/23/2019  Transportation Screening Complete Complete Complete  PCP or Specialist Appt within 3-5 Days - - Not Complete  HRI or Home Care Consult Complete Complete Complete  Social Work Consult for [place] Planning/Counseling Complete Complete Complete  Palliative Care Screening Complete Not Applicable Not Applicable  Medication Review Press photographer) Complete Complete Complete  Some recent data might be hidden

## 2020-12-18 NOTE — Progress Notes (Signed)
Patient Demographics:    Keith Olson, is a 80 y.o. male, DOB - [birth year], GEZ:[id number]  Admit date - 12/04/2020   Admitting Physician Ejiroghene Arlyce Dice, MD  Outpatient Primary MD for the patient is Dettinger, Fransisca Kaufmann, MD  LOS - 1   Chief Complaint  Patient presents with   Hypertension        Subjective:    Keith Olson today has no fevers,  ,  No chest pain,  with recurrent emesis-- -No BM -No further headaches, no palpitations no dizziness  Assessment  & Plan :    Principal Problem:   Hypertensive urgency Active Problems:   Prostate cancer metastatic to bone [facility])   CAD (coronary artery disease)   Depression, major, single episode, moderate (HCC)   Hypothyroidism   Hypokalemia  Brief Summary:-  80 y.o. male with medical history significant for metastatic prostate cancer, hypertension, depression admitted on 11/30/2020 with hypertensive urgency  A/p 1) hypertensive urgency--- emesis persist, headache improved, -CT head without acute findings -No chest pains dizziness or  palpitations -We will attempt to transition off IV nicardipine drip -Increase oral lisinopril to 20 mg daily and start p.o. amlodipine 10 mg daily -May use IV labetalol as needed elevated BP  2) recurrent large volume emesis----no blood, query bile----may be related to chemo treatment -Abdominal x-rays without obstructive finding -Check lipase  3) persistent hypokalemia--- potassium in the 2.4 range, magnesium WNL -Suspect potassium losses from recurrent emesis- -Replace and recheck potassium  4) constipation--- suspect partly due to chronic opiates for metastatic prostate cancer to the bones - -- Give MiraLAX and Dulcolax suppository as ordered  5)H/o CAD-stable, no chest pains, continue aspirin  6)Depression/Anxiety--continue Xanax as needed  7)Metastatic prostate cancer to bone-follows with Dr.  Delton Coombes.  Diagnosed 2016.  Status post chemotherapy and seed implants.  Continue Zytiga.  -Opiates for pain control  8) generalized weakness and deconditioning--- patient will benefit from home health PT upon discharge -PTA uses a walker at home  Disposition/Need for in-Hospital Stay- patient unable to be discharged at this time due to --hypertensive urgency requiring IV nicardipine, recurrent emesis with hypokalemia requiring IV antiemetics and IV fluids -Possible discharge home in a.m. if able to tolerate oral intake well  Status is: Inpatient  Remains inpatient appropriate because: Please see disposition above  Disposition: The patient is from: Home              Anticipated d/c is to: Home              Anticipated d/c date is: 1 day              Patient currently is not medically stable to d/c. Barriers: Not Clinically Stable-   Code Status :  -  Code Status: DNR   Family Communication:    NA (patient is alert, awake and coherent)   Consults  :  na  DVT Prophylaxis  :   - SCDs   enoxaparin (LOVENOX) injection 40 mg Start: 12/18/20 0600    Lab Results  Component Value Date   PLT 270 12/18/2020    Inpatient Medications  Scheduled Meds:  abiraterone acetate  1,000 mg Oral Daily   ALPRAZolam  0.5 mg Oral QHS   [START  ON 01/13/2021] amLODipine  10 mg Oral Daily   aspirin EC  81 mg Oral Daily   bisacodyl  10 mg Rectal Daily   Chlorhexidine Gluconate Cloth  6 each Topical Q0600   enoxaparin (LOVENOX) injection  40 mg Subcutaneous Q24H   [START ON 12/20/2020] fentaNYL  1 patch Transdermal Q72H   lisinopril  20 mg Oral Daily   polyethylene glycol  17 g Oral BID   potassium chloride SA  20 mEq Oral Daily   Continuous Infusions:  niCARDipine Stopped (12/18/20 1124)   PRN Meds:.acetaminophen **OR** acetaminophen, HYDROmorphone, labetalol, ondansetron **OR** ondansetron (ZOFRAN) IV    Anti-infectives (From admission, onward)    None         Objective:    Vitals:   12/18/20 1400 12/18/20 1430 12/18/20 1500 12/18/20 1530  BP: (!) 184/94 (!) 154/80 (!) 161/86   Pulse: 80 81 69 82  Resp: 19 16 (!) 25 19  Temp:      TempSrc:      SpO2: 96% 92% 92% 93%  Weight:      Height:        Wt Readings from Last 3 Encounters:  12/28/2020 68 kg  12/05/2020 73.2 kg  10/22/20 71 kg     Intake/Output Summary (Last 24 hours) at 12/18/2020 1605 Last data filed at 12/18/2020 1431 Gross per 24 hour  Intake 2095.84 ml  Output 2950 ml  Net -854.16 ml    Physical Exam  Gen:- Awake Alert,  in no apparent distress  HEENT:- [place].AT, No sclera icterus Neck-Supple Neck,No JVD,.  Lungs-  CTAB , fair symmetrical air movement CV- S1, S2 normal, regular  Abd-  +ve B.Sounds, Abd Soft, No tenderness,    Extremity/Skin:- No  edema, pedal pulses present  Psych-affect is appropriate, oriented x3 Neuro-generalized weakness, no new focal deficits, no tremors   Data Review:   Micro Results Recent Results (from the past 240 hour(s))  Resp Panel by RT-PCR (Flu A&B, Covid) Nasopharyngeal Swab     Status: None   Collection Time: 12/12/2020  9:39 PM   Specimen: Nasopharyngeal Swab; Nasopharyngeal(NP) swabs in vial transport medium  Result Value Ref Range Status   SARS Coronavirus 2 by RT PCR NEGATIVE NEGATIVE Final    Comment: (NOTE) SARS-CoV-2 target nucleic acids are NOT DETECTED.  The SARS-CoV-2 RNA is generally detectable in upper respiratory specimens during the acute phase of infection. The lowest concentration of SARS-CoV-2 viral copies this assay can detect is 138 copies/mL. A negative result does not preclude SARS-Cov-2 infection and should not be used as the sole basis for treatment or other patient management decisions. A negative result may occur with  improper specimen collection/handling, submission of specimen other than nasopharyngeal swab, presence of viral mutation(s) within the areas targeted by this assay, and inadequate number of  viral copies(<138 copies/mL). A negative result must be combined with clinical observations, patient history, and epidemiological information. The expected result is Negative.  Fact Sheet for Patients:  EntrepreneurPulse.com.au  Fact Sheet for Healthcare Providers:  IncredibleEmployment.be  This test is no t yet approved or cleared by the [place] FDA and  has been authorized for detection and/or diagnosis of SARS-CoV-2 by FDA under an Emergency Use Authorization (EUA). This EUA will remain  in effect (meaning this test can be used) for the duration of the COVID-19 declaration under Section 564(b)(1) of the Act, 21 U.S.C.section 360bbb-3(b)(1), unless the authorization is terminated  or revoked sooner.       Influenza A  by PCR NEGATIVE NEGATIVE Final   Influenza B by PCR NEGATIVE NEGATIVE Final    Comment: (NOTE) The Xpert Xpress SARS-CoV-2/FLU/RSV plus assay is intended as an aid in the diagnosis of influenza from Nasopharyngeal swab specimens and should not be used as a sole basis for treatment. Nasal washings and aspirates are unacceptable for Xpert Xpress SARS-CoV-2/FLU/RSV testing.  Fact Sheet for Patients: EntrepreneurPulse.com.au  Fact Sheet for Healthcare Providers: IncredibleEmployment.be  This test is not yet approved or cleared by the [place] FDA and has been authorized for detection and/or diagnosis of SARS-CoV-2 by FDA under an Emergency Use Authorization (EUA). This EUA will remain in effect (meaning this test can be used) for the duration of the COVID-19 declaration under Section 564(b)(1) of the Act, 21 U.S.C. section 360bbb-3(b)(1), unless the authorization is terminated or revoked.  Performed at [facility], [address], [place], [place] [address]   MRSA Next Gen by PCR, Nasal     Status: None   Collection Time: 12/18/20  1:12 AM   Specimen: Nasal Mucosa; Nasal Swab   Result Value Ref Range Status   MRSA by PCR Next Gen NOT DETECTED NOT DETECTED Final    Comment: (NOTE) The GeneXpert MRSA Assay (FDA approved for NASAL specimens only), is one component of a comprehensive MRSA colonization surveillance program. It is not intended to diagnose MRSA infection nor to guide or monitor treatment for MRSA infections. Test performance is not FDA approved in patients less than 45 years old. Performed at [facility], [address] [place], [place] [address]     Radiology Reports CT Head Wo Contrast  Result Date: 12/11/2020 CLINICAL DATA:  Headache, new or worsening (Age >= 50y) EXAM: CT HEAD WITHOUT CONTRAST TECHNIQUE: Contiguous axial images were obtained from the base of the skull through the vertex without intravenous contrast. COMPARISON:  10/10/2020 FINDINGS: Brain: There is atrophy and chronic small vessel disease changes. No acute intracranial abnormality. Specifically, no hemorrhage, hydrocephalus, mass lesion, acute infarction, or significant intracranial injury. Vascular: No hyperdense vessel or unexpected calcification. Skull: No acute calvarial abnormality. Sinuses/Orbits: No acute findings Other: None IMPRESSION: Atrophy, chronic microvascular disease. No acute intracranial abnormality. Electronically Signed   By: Rolm Baptise M.D.   On: 12/22/2020 18:04   DG ABD ACUTE 2+V W 1V CHEST  Result Date: 12/18/2020 CLINICAL DATA:  Vomiting. EXAM: DG ABDOMEN ACUTE WITH 1 VIEW CHEST COMPARISON:  None. FINDINGS: There is no evidence of dilated bowel loops or free intraperitoneal air. No radiopaque calculi or other significant radiographic abnormality is seen. Heart size and mediastinal contours are within normal limits. Mild right basilar subsegmental atelectasis is noted. IMPRESSION: No abnormal bowel dilatation is noted. Mild right basilar subsegmental atelectasis. Electronically Signed   By: Marijo Conception M.D.   On: 12/18/2020 15:25     CBC Recent Labs   Lab 12/02/2020 1429 12/26/2020 2127 12/18/20 0425  WBC 7.0 12.3* 10.5  HGB 14.8 14.8 14.4  HCT 43.6 43.3 42.3  PLT 280 282 270  MCV 90.8 90.2 89.4  MCH 30.8 30.8 30.4  MCHC 33.9 34.2 34.0  RDW 13.2 13.2 13.2  LYMPHSABS 2.0 1.9  --   MONOABS 0.7 0.8  --   EOSABS 0.4 0.4  --   BASOSABS 0.1 0.1  --     Chemistries  Recent Labs  Lab 12/28/2020 1429 12/25/2020 2127 12/18/20 0425  NA 133* 140 141  141  K 2.6* 2.5* 2.6*  2.4*  CL 93* 100 106  105  CO2 29 28 25  27   GLUCOSE 115* 133* 126*  131*  BUN 12 10 10  9   CREATININE 0.55* 0.56* 0.53*  0.51*  CALCIUM 9.1 9.1 8.7*  8.8*  MG 2.4  --  2.5*  AST 23  --   --   ALT 23  --   --   ALKPHOS 72  --   --   BILITOT 0.5  --   --    ------------------------------------------------------------------------------------------------------------------ No results for input(s): CHOL, HDL, LDLCALC, TRIG, CHOLHDL, LDLDIRECT in the last 72 hours.  Lab Results  Component Value Date   HGBA1C 5.8 (H) 10/11/2020   ------------------------------------------------------------------------------------------------------------------ No results for input(s): TSH, T4TOTAL, T3FREE, THYROIDAB in the last 72 hours.  Invalid input(s): FREET3 ------------------------------------------------------------------------------------------------------------------ No results for input(s): VITAMINB12, FOLATE, FERRITIN, TIBC, IRON, RETICCTPCT in the last 72 hours.  Coagulation profile No results for input(s): INR, PROTIME in the last 168 hours.  No results for input(s): DDIMER in the last 72 hours.  Cardiac Enzymes No results for input(s): CKMB, TROPONINI, MYOGLOBIN in the last 168 hours.  Invalid input(s): CK ------------------------------------------------------------------------------------------------------------------    Component Value Date/Time   BNP 16.0 09/05/2017 1840    Sheala Dosh M.D on 12/18/2020 at 4:05 PM  Go to [email or web address] - for  contact info  [facility] - Office  [phone]

## 2020-12-19 ENCOUNTER — Inpatient Hospital Stay (HOSPITAL_COMMUNITY): Payer: Medicare Other

## 2020-12-19 DIAGNOSIS — Z515 Encounter for palliative care: Secondary | ICD-10-CM

## 2020-12-19 DIAGNOSIS — J9601 Acute respiratory failure with hypoxia: Secondary | ICD-10-CM | POA: Diagnosis not present

## 2020-12-19 DIAGNOSIS — I160 Hypertensive urgency: Secondary | ICD-10-CM | POA: Diagnosis not present

## 2020-12-19 DIAGNOSIS — G9341 Metabolic encephalopathy: Secondary | ICD-10-CM | POA: Diagnosis not present

## 2020-12-19 LAB — CBC
HCT: 47 % (ref 39.0–52.0)
Hemoglobin: 16.1 g/dL (ref 13.0–17.0)
MCH: 31.5 pg (ref 26.0–34.0)
MCHC: 34.3 g/dL (ref 30.0–36.0)
MCV: 92 fL (ref 80.0–100.0)
Platelets: 266 10*3/uL (ref 150–400)
RBC: 5.11 MIL/uL (ref 4.22–5.81)
RDW: 13.8 % (ref 11.5–15.5)
WBC: 9.2 10*3/uL (ref 4.0–10.5)
nRBC: 0 % (ref 0.0–0.2)

## 2020-12-19 LAB — COMPREHENSIVE METABOLIC PANEL
ALT: 17 U/L (ref 0–44)
AST: 29 U/L (ref 15–41)
Albumin: 3.6 g/dL (ref 3.5–5.0)
Alkaline Phosphatase: 58 U/L (ref 38–126)
Anion gap: 14 (ref 5–15)
BUN: 22 mg/dL (ref 8–23)
CO2: 21 mmol/L — ABNORMAL LOW (ref 22–32)
Calcium: 8.5 mg/dL — ABNORMAL LOW (ref 8.9–10.3)
Chloride: 106 mmol/L (ref 98–111)
Creatinine, Ser: 1.7 mg/dL — ABNORMAL HIGH (ref 0.61–1.24)
GFR, Estimated: 41 mL/min — ABNORMAL LOW (ref >60)
Glucose, Bld: 118 mg/dL — ABNORMAL HIGH (ref 70–99)
Potassium: 3 mmol/L — ABNORMAL LOW (ref 3.5–5.1)
Sodium: 141 mmol/L (ref 135–145)
Total Bilirubin: 1.5 mg/dL — ABNORMAL HIGH (ref 0.3–1.2)
Total Protein: 6.6 g/dL (ref 6.5–8.1)

## 2020-12-19 LAB — BLOOD GAS, ARTERIAL
Acid-Base Excess: 2.2 mmol/L — ABNORMAL HIGH (ref 0.0–2.0)
Bicarbonate: 27.5 mmol/L (ref 20.0–28.0)
FIO2: 100
O2 Saturation: 88.5 %
Patient temperature: 37
pCO2 arterial: 26.8 mmHg — ABNORMAL LOW (ref 32.0–48.0)
pH, Arterial: 7.568 — ABNORMAL HIGH (ref 7.350–7.450)
pO2, Arterial: 52.9 mmHg — ABNORMAL LOW (ref 83.0–108.0)

## 2020-12-19 LAB — LIPASE, BLOOD: Lipase: 22 U/L (ref 11–51)

## 2020-12-19 IMAGING — DX DG CHEST 1V
1 series · 1 of 1 positions shown · non-contrast
Comparison: [DATE]

CLINICAL DATA: Increasing shortness of breath

EXAM:
PORTABLE CHEST 1 VIEW

[chest ap]
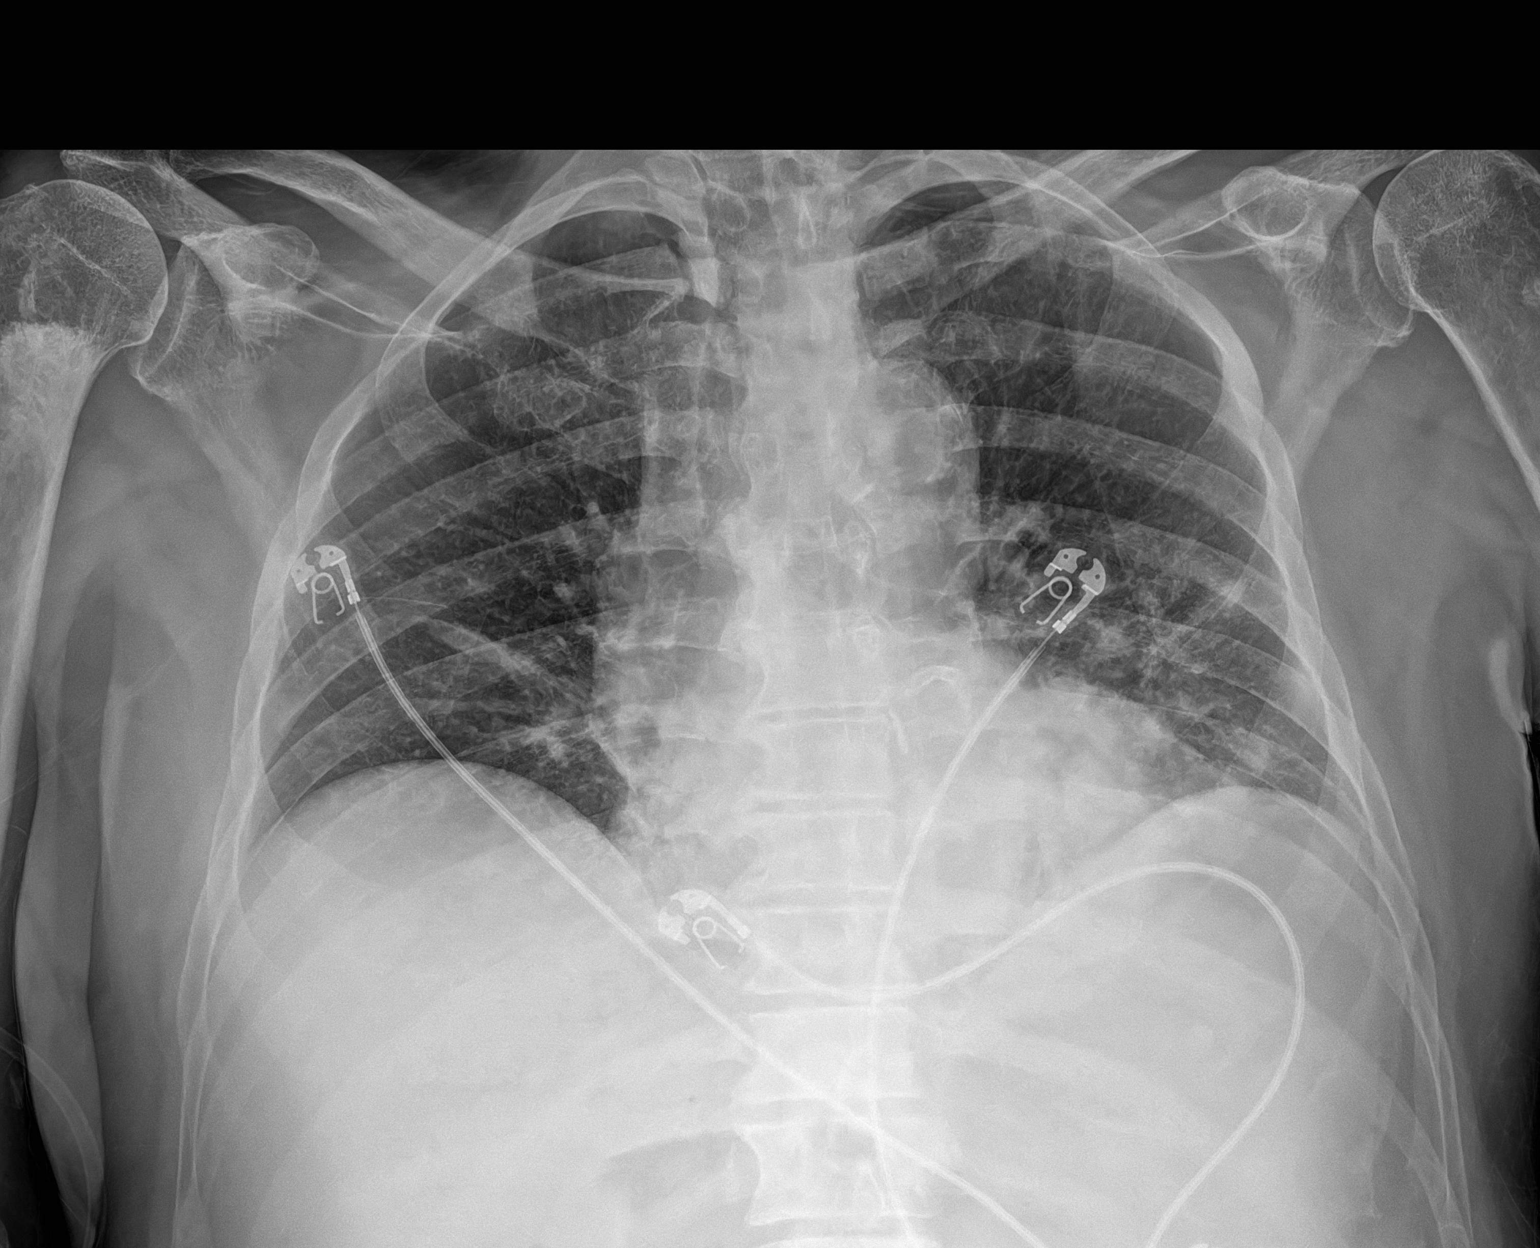

[1 of 1 positions shown; findings below may reference images not displayed]

FINDINGS: Cardiac shadow is stable. Aortic calcifications are noted. Mild
bibasilar atelectatic changes are seen. No acute bony abnormality
noted.
IMPRESSION: Bibasilar atelectatic changes.

## 2020-12-19 MED ORDER — SODIUM CHLORIDE 0.9% FLUSH: 3.0000 mL | INTRAVENOUS | Status: DC | PRN

## 2020-12-19 MED ORDER — SODIUM CHLORIDE 0.9% FLUSH: 3.0000 mL | Freq: Two times a day (BID) | INTRAVENOUS | Status: DC

## 2020-12-19 MED ORDER — BIOTENE DRY MOUTH MT LIQD: 15.0000 mL | OROMUCOSAL | Status: DC | PRN

## 2020-12-19 MED ORDER — SODIUM CHLORIDE 0.9 % IV SOLN: 250.0000 mL | INTRAVENOUS | Status: DC | PRN

## 2020-12-19 MED ORDER — LORAZEPAM 2 MG/ML IJ SOLN: 1.0000 mg | INTRAMUSCULAR | Status: DC | PRN

## 2020-12-19 MED ORDER — POTASSIUM CHLORIDE 10 MEQ/100ML IV SOLN
10.0000 meq | INTRAVENOUS | Status: DC
  Administered 2020-12-19 (×2): 10 meq via INTRAVENOUS
  Filled 2020-12-19 (×2): qty 100

## 2020-12-19 MED ORDER — GLYCOPYRROLATE 0.2 MG/ML IJ SOLN: 0.2000 mg | INTRAMUSCULAR | Status: DC | PRN

## 2020-12-19 MED ORDER — ONDANSETRON 4 MG PO TBDP: 4.0000 mg | ORAL_TABLET | Freq: Four times a day (QID) | ORAL | Status: DC | PRN

## 2020-12-19 MED ORDER — ACETAMINOPHEN 325 MG PO TABS: 650.0000 mg | ORAL_TABLET | Freq: Four times a day (QID) | ORAL | Status: DC | PRN

## 2020-12-19 MED ORDER — ACETAMINOPHEN 650 MG RE SUPP: 650.0000 mg | Freq: Four times a day (QID) | RECTAL | Status: DC | PRN

## 2020-12-19 MED ORDER — FENTANYL CITRATE PF 50 MCG/ML IJ SOSY: 25.0000 ug | PREFILLED_SYRINGE | INTRAMUSCULAR | Status: DC | PRN

## 2020-12-19 MED ORDER — ALBUTEROL SULFATE (2.5 MG/3ML) 0.083% IN NEBU: 2.5000 mg | INHALATION_SOLUTION | RESPIRATORY_TRACT | Status: DC | PRN

## 2020-12-19 MED ORDER — LORAZEPAM 2 MG/ML PO CONC: 1.0000 mg | ORAL | Status: DC | PRN

## 2020-12-19 MED ORDER — POTASSIUM CHLORIDE IN NACL 40-0.9 MEQ/L-% IV SOLN: Freq: Once | INTRAVENOUS | Status: AC

## 2020-12-19 MED ORDER — SODIUM CHLORIDE 0.9 % IV SOLN: INTRAVENOUS | Status: DC

## 2020-12-19 MED ORDER — ONDANSETRON HCL 4 MG/2ML IJ SOLN: 4.0000 mg | Freq: Four times a day (QID) | INTRAMUSCULAR | Status: DC | PRN

## 2020-12-19 MED ORDER — POLYVINYL ALCOHOL 1.4 % OP SOLN: 1.0000 [drp] | Freq: Four times a day (QID) | OPHTHALMIC | Status: DC | PRN

## 2020-12-19 MED ORDER — POTASSIUM CHLORIDE 10 MEQ/100ML IV SOLN
10.0000 meq | INTRAVENOUS | Status: AC
Start: 2020-12-19 — End: 2020-12-19
  Administered 2020-12-19 (×2): 10 meq via INTRAVENOUS
  Filled 2020-12-19 (×2): qty 100

## 2020-12-19 MED ORDER — LORAZEPAM 1 MG PO TABS: 1.0000 mg | ORAL_TABLET | ORAL | Status: DC | PRN

## 2020-12-19 MED ORDER — GLYCOPYRROLATE 1 MG PO TABS: 1.0000 mg | ORAL_TABLET | ORAL | Status: DC | PRN

## 2020-12-30 NOTE — Progress Notes (Signed)
Sons, Will and [place], took patient's belongings. Including cell phone and clothing. Funeral home choice is [facility] in [place].

## 2020-12-30 NOTE — Progress Notes (Signed)
Per Dr. Joesph Fillers and family at bedside. Patient is comfort care.

## 2020-12-30 NOTE — Discharge Summary (Addendum)
---   Please see full death summary  Brief summary:- - 80 y.o. male with medical history significant for metastatic prostate cancer, hypertension, depression admitted on 12/06/2020 with hypertensive urgency   -Patient initially was treated with IV nicardipine drip for BP control, BP improved initially attempt to take off the nicardipine drip was unsuccessful so patient was placed back on IV nicardipine drip on 12/18/2020 due to persistently elevated BP - -Patient's 2 sons [name] and Will are at bedside--they tell me  and the Chaplain that patient has had very very elevated BP recently -Both sons also states that patient was pretty clear about his DNR status and not wanting aggressive interventions   Overnight events noted, chest x-ray and ABG overnight noted -Apparently patient developed acute altered mentation overnight became very hypoxic subsequently became hypotensive as well -I had concern for ICH--given persistent elevated BP over the last several days with vomiting--- I had ordered a stat CT head this morning to look for intracranial bleed -Patient's 2 sons tell me that [place] would not of wanted further aggressive interventions -CT head without contrast and CTA chest was ordered due to altered mentation and hypoxia respectively will not be done at request of patient's 2 sons were at bedside -Frankly given how quickly patient has decompensated overnight it probably will not change patient's outcome   -- At this time both sons request transition to comfort care --Patient remains on high flow heated oxygen with O2 sats in the 64s -Chaplain Ms Bennie Pierini is at bedside offering support and prayers to patient and family  - -Again clinically suspect intracranial hemorrhage given persistently elevated BP, Recurrent emesis and subsequently acute sudden alteration in mentation - - Transition to comfort care   -Time of death 11:45 AM  on 12/31/20  -Please see full dictated death  summary  Roxan Hockey, MD

## 2020-12-30 NOTE — Progress Notes (Addendum)
CRITICAL CARE NOTE  RN called due to change in patient's mentation, oxygen requirement and work of breathing.  He was only oriented to person, patient was initially saturating well on 4L [place], but O2 sats began to stay at 85-88%, so was placed on high flow 15L [place] with no improvement since O2 sat was stable at 88%, so NRB at 15L was added with O2 sat at 89% and respiratory rate at 42/minute. ABG was checked and showed pH 7.568, PCO2 36.8, PO2 52.9, bicarb 27.5 at FiO2 of 100% K+ was noted to be at 2.6, this was replenished. Chest x-ray was ordered and showed bibasilar atelectatic changes.  At bedside, patient was noted to be tachypneic and was trying to take off his NRB. Blood pressure (!) 103/42, pulse (!) 102, temperature 99.9 F (37.7 C), temperature source Axillary, resp. rate (!) 42, height 5\' 8"  (1.727 m), weight 68 kg, SpO2 92 %.   Physical Exam  Gen:- Awake Alert, and oriented x1 (person) HEENT:- [place].AT, No sclera icterus Neck-Supple Neck,No JVD,.  Lungs-  CTAB  CV- S1, S2 normal Abd-  +ve B.Sounds, Abd Soft, No tenderness,    Extremity/Skin:- No  edema,    Psych-affect is appropriate, oriented x 1 Neuro-no new focal deficits, no tremors  Assessment and plan Acute respiratory failure with hypoxia (worsening) Other than atelectasis seen on chest x-ray, the cause of patient's distress was not obvious Continue supplemental oxygen via heated flow nasal cannula Continue telemetry BiPAP cannot be placed at this time due to patient's vomiting Patient is DNR, so intubation was not an option Continue incentive spirometry when patient is able to cooperate Continue PT/OT eval and treat Consider palliative care  Critical time: 71minutes  Critical care personally provided  managing the patient due to high probability of clinically significant and life threatening deterioration. This critical care time included obtaining a history; examining the patient, pulse oximetry; ordering and review  of studies; arranging urgent treatment with development of a management plan; evaluation of patient's response of treatment frequent reassessment; and discussions with other providers.  This critical care time was performed to assess and manage the high probability of imminent and life threatening deterioration that could result in multi-organ failure.

## 2020-12-30 NOTE — Death Summary Note (Addendum)
DEATH SUMMARY   Patient Details  Name: Keith Olson. MRN: [id number] DOB: November 18, 1940  Admission/Discharge Information   Admit Date:  2021-01-10  Date of Death: Date of Death: 01-12-2021  Time of Death: Time of Death: 78  Length of Stay: 2  Referring Physician: Dettinger, Fransisca Kaufmann, MD   Reason(s) for Hospitalization  Brief summary:- - 80 y.o. male with medical history significant for metastatic prostate cancer, hypertension, depression admitted on 2021/01/10 with hypertensive urgency  Diagnoses  Preliminary cause of death:  Secondary Diagnoses (including complications and co-morbidities):  Principal Problem:   End of life care Active Problems:   Prostate cancer metastatic to bone [facility])   CAD (coronary artery disease)   Depression, major, single episode, moderate (HCC)   Hypothyroidism   Hypokalemia   Hypertensive urgency   Palliative care patient   Acute metabolic encephalopathy-Suspect ICH   Brief Hospital Course (including significant findings, care, treatment, and services provided and events leading to death)  Keith Olson. is a 80 y.o. year old male  Brief summary:- - 80 y.o. male with medical history significant for metastatic prostate cancer, hypertension, depression admitted on 01-10-2021 with hypertensive urgency   -Patient initially was treated with IV nicardipine drip for BP control, BP improved initially attempt to take off the nicardipine drip was unsuccessful so patient was placed back on IV nicardipine drip on 12/18/2020 due to persistently elevated BP - -Patient's 2 sons Keith Olson and Keith Olson are at bedside--they tell me  and the Chaplain that patient has had very very elevated BP recently -Both sons also states that patient was pretty clear about his DNR status and not wanting aggressive interventions   Overnight events noted, chest x-ray and ABG overnight noted -Apparently patient developed acute altered mentation overnight became very hypoxic  subsequently became hypotensive as well -I had concern for ICH--given persistent elevated BP over the last several days with vomiting--- I had ordered a stat CT head this morning to look for intracranial bleed -Patient's 2 sons tell me that [place]. would not of wanted further aggressive interventions -CT head without contrast and CTA chest was ordered due to altered mentation and hypoxia respectively Keith Olson not be done at request of patient's 2 sons were at bedside -Frankly given how quickly patient has decompensated overnight it probably Keith Olson not change patient's outcome   -- At this time both sons request transition to comfort care --Patient remains on high flow heated oxygen with O2 sats in the 9s -Chaplain Ms Keith Olson is at bedside offering support and prayers to patient and family  - -Again clinically suspect intracranial hemorrhage given persistently elevated BP, Recurrent emesis and subsequently acute sudden alteration in mentation - - Transition to comfort care   -Time of death 11:45 AM  on January 12, 2021   Pertinent Labs and Studies  Significant Diagnostic Studies DG Chest 1 View  Result Date: January 12, 2021 CLINICAL DATA:  Increasing shortness of breath EXAM: PORTABLE CHEST 1 VIEW COMPARISON:  10/10/2020 FINDINGS: Cardiac shadow is stable. Aortic calcifications are noted. Mild bibasilar atelectatic changes are seen. No acute bony abnormality noted. IMPRESSION: Bibasilar atelectatic changes. Electronically Signed   By: Inez Catalina M.D.   On: Jan 12, 2021 03:28   CT Head Wo Contrast  Result Date: January 10, 2021 CLINICAL DATA:  Headache, new or worsening (Age >= 50y) EXAM: CT HEAD WITHOUT CONTRAST TECHNIQUE: Contiguous axial images were obtained from the base of the skull through the vertex without intravenous contrast. COMPARISON:  10/10/2020 FINDINGS: Brain: There is atrophy and  chronic small vessel disease changes. No acute intracranial abnormality. Specifically, no hemorrhage,  hydrocephalus, mass lesion, acute infarction, or significant intracranial injury. Vascular: No hyperdense vessel or unexpected calcification. Skull: No acute calvarial abnormality. Sinuses/Orbits: No acute findings Other: None IMPRESSION: Atrophy, chronic microvascular disease. No acute intracranial abnormality. Electronically Signed   By: Rolm Baptise M.D.   On: 12/11/2020 18:04   DG ABD ACUTE 2+V W 1V CHEST  Result Date: 12/18/2020 CLINICAL DATA:  Vomiting. EXAM: DG ABDOMEN ACUTE WITH 1 VIEW CHEST COMPARISON:  None. FINDINGS: There is no evidence of dilated bowel loops or free intraperitoneal air. No radiopaque calculi or other significant radiographic abnormality is seen. Heart size and mediastinal contours are within normal limits. Mild right basilar subsegmental atelectasis is noted. IMPRESSION: No abnormal bowel dilatation is noted. Mild right basilar subsegmental atelectasis. Electronically Signed   By: Marijo Conception M.D.   On: 12/18/2020 15:25    Microbiology Recent Results (from the past 240 hour(s))  Resp Panel by RT-PCR (Flu A&B, Covid) Nasopharyngeal Swab     Status: None   Collection Time: 11/29/2020  9:39 PM   Specimen: Nasopharyngeal Swab; Nasopharyngeal(NP) swabs in vial transport medium  Result Value Ref Range Status   SARS Coronavirus 2 by RT PCR NEGATIVE NEGATIVE Final    Comment: (NOTE) SARS-CoV-2 target nucleic acids are NOT DETECTED.  The SARS-CoV-2 RNA is generally detectable in upper respiratory specimens during the acute phase of infection. The lowest concentration of SARS-CoV-2 viral copies this assay can detect is 138 copies/mL. A negative result does not preclude SARS-Cov-2 infection and should not be used as the sole basis for treatment or other patient management decisions. A negative result may occur with  improper specimen collection/handling, submission of specimen other than nasopharyngeal swab, presence of viral mutation(s) within the areas targeted by this  assay, and inadequate number of viral copies(<138 copies/mL). A negative result must be combined with clinical observations, patient history, and epidemiological information. The expected result is Negative.  Fact Sheet for Patients:  EntrepreneurPulse.com.au  Fact Sheet for Healthcare Providers:  IncredibleEmployment.be  This test is no t yet approved or cleared by the [place] FDA and  has been authorized for detection and/or diagnosis of SARS-CoV-2 by FDA under an Emergency Use Authorization (EUA). This EUA Keith Olson remain  in effect (meaning this test can be used) for the duration of the COVID-19 declaration under Section 564(b)(1) of the Act, 21 U.S.C.section 360bbb-3(b)(1), unless the authorization is terminated  or revoked sooner.       Influenza A by PCR NEGATIVE NEGATIVE Final   Influenza B by PCR NEGATIVE NEGATIVE Final    Comment: (NOTE) The Xpert Xpress SARS-CoV-2/FLU/RSV plus assay is intended as an aid in the diagnosis of influenza from Nasopharyngeal swab specimens and should not be used as a sole basis for treatment. Nasal washings and aspirates are unacceptable for Xpert Xpress SARS-CoV-2/FLU/RSV testing.  Fact Sheet for Patients: EntrepreneurPulse.com.au  Fact Sheet for Healthcare Providers: IncredibleEmployment.be  This test is not yet approved or cleared by the [place] FDA and has been authorized for detection and/or diagnosis of SARS-CoV-2 by FDA under an Emergency Use Authorization (EUA). This EUA Keith Olson remain in effect (meaning this test can be used) for the duration of the COVID-19 declaration under Section 564(b)(1) of the Act, 21 U.S.C. section 360bbb-3(b)(1), unless the authorization is terminated or revoked.  Performed at [facility], [address] [place], [place] [address]   MRSA Next Gen by PCR, Nasal  Status: None   Collection Time: 12/18/20  1:12 AM    Specimen: Nasal Mucosa; Nasal Swab  Result Value Ref Range Status   MRSA by PCR Next Gen NOT DETECTED NOT DETECTED Final    Comment: (NOTE) The GeneXpert MRSA Assay (FDA approved for NASAL specimens only), is one component of a comprehensive MRSA colonization surveillance program. It is not intended to diagnose MRSA infection nor to guide or monitor treatment for MRSA infections. Test performance is not FDA approved in patients less than 54 years old. Performed at [facility], [address] [place], [place] [address]     Lab Basic Metabolic Panel: Recent Labs  Lab 12/21/2020 1429 11/29/2020 2127 12/18/20 0425 2020/12/30 0459  NA 133* 140 141  141 141  K 2.6* 2.5* 2.6*  2.4* 3.0*  CL 93* 100 106  105 106  CO2 29 28 25  27  21*  GLUCOSE 115* 133* 126*  131* 118*  BUN 12 10 10  9 22   CREATININE 0.55* 0.56* 0.53*  0.51* 1.70*  CALCIUM 9.1 9.1 8.7*  8.8* 8.5*  MG 2.4  --  2.5*  --   PHOS  --   --  2.4*  --    Liver Function Tests: Recent Labs  Lab 12/27/2020 1429 12/18/20 0425 December 30, 2020 0459  AST 23  --  29  ALT 23  --  17  ALKPHOS 72  --  58  BILITOT 0.5  --  1.5*  PROT 7.5  --  6.6  ALBUMIN 4.3 4.2 3.6   Recent Labs  Lab Dec 30, 2020 0459  LIPASE 22   No results for input(s): AMMONIA in the last 168 hours. CBC: Recent Labs  Lab 12/01/2020 1429 12/15/2020 2127 12/18/20 0425 2020-12-30 0459  WBC 7.0 12.3* 10.5 9.2  NEUTROABS 3.9 9.0*  --   --   HGB 14.8 14.8 14.4 16.1  HCT 43.6 43.3 42.3 47.0  MCV 90.8 90.2 89.4 92.0  PLT 280 282 270 266   Cardiac Enzymes: No results for input(s): CKTOTAL, CKMB, CKMBINDEX, TROPONINI in the last 168 hours. Sepsis Labs: Recent Labs  Lab 12/27/2020 1429 12/25/2020 2127 12/18/20 0425 12-30-2020 0459  WBC 7.0 12.3* 10.5 9.2    Procedures/Operations    Morgon Pamer 12/30/2020, 12:36 PM

## 2020-12-30 NOTE — Progress Notes (Addendum)
Brief summary:- - 80 y.o. male with medical history significant for metastatic prostate cancer, hypertension, depression admitted on 12/15/2020 with hypertensive urgency   -Patient initially was treated with IV nicardipine drip for BP control, BP improved initially attempt to take off the nicardipine drip was unsuccessful so patient was placed back on IV nicardipine drip on 12/18/2020 due to persistently elevated BP - -Patient's 2 sons [name] and Will are at bedside--they tell me  and the Chaplain that patient has had very very elevated BP recently -Both sons also states that patient was pretty clear about his DNR status and not wanting aggressive interventions   Overnight events noted, chest x-ray and ABG overnight noted -Apparently patient developed acute altered mentation overnight became very hypoxic subsequently became hypotensive as well -I had concern for ICH--given persistent elevated BP over the last several days with vomiting--- I had ordered a stat CT head this morning to look for intracranial bleed -Patient's 2 sons tell me that [place]. would not of wanted further aggressive interventions -CT head without contrast and CTA chest was ordered due to altered mentation and hypoxia respectively will not be done at request of patient's 2 sons were at bedside -Frankly given how quickly patient has decompensated overnight it probably will not change patient's outcome   -- At this time both sons request transition to comfort care --Patient remains on high flow heated oxygen with O2 sats in the 2s -Chaplain Ms Bennie Pierini is at bedside offering support and prayers to patient and family  - -Again clinically suspect intracranial hemorrhage given persistently elevated BP, recurrent emesis and subsequently acute sudden alteration in mentation  - Transition to comfort care anticipated in-hospital death  Total care time 43 minutes Roxan Hockey, MD

## 2020-12-30 NOTE — Progress Notes (Addendum)
     Referral received for Keith Olson. for goals of care discussion. Chart reviewed and updates received from RN. Spoke with hospitalist Dr. Roxan Hockey in the ICU. Patient had significant decline overnight and early this morning was made comfort care. Family at bedside with spiritual care. Per Dr. Denton Brick request, consult will be cancelled.  Thank you for your referral and allowing PMT to assist in [name]s care.   Walden Field, NP Palliative Medicine Team Phone: [phone]  NO CHARGE

## 2020-12-30 DEATH — deceased

## 2021-01-13 ENCOUNTER — Other Ambulatory Visit (HOSPITAL_COMMUNITY): Payer: Self-pay

## 2021-01-14 ENCOUNTER — Ambulatory Visit (HOSPITAL_COMMUNITY): Payer: Medicare Other

## 2021-01-14 ENCOUNTER — Other Ambulatory Visit (HOSPITAL_COMMUNITY): Payer: Medicare Other

## 2021-01-29 DEATH — deceased

## 2021-02-12 ENCOUNTER — Ambulatory Visit (HOSPITAL_COMMUNITY): Payer: Medicare Other

## 2021-02-12 ENCOUNTER — Ambulatory Visit (HOSPITAL_COMMUNITY): Payer: Medicare Other | Admitting: Hematology

## 2021-02-12 ENCOUNTER — Other Ambulatory Visit (HOSPITAL_COMMUNITY): Payer: Medicare Other
